# Patient Record
Sex: Male | Born: 1970
Health system: Southern US, Community
[De-identification: ages and names within clinical notes are randomized; demographics above are authoritative.]

## PROBLEM LIST (undated history)

## (undated) DIAGNOSIS — I10 Essential (primary) hypertension: Secondary | ICD-10-CM

## (undated) DIAGNOSIS — I2510 Atherosclerotic heart disease of native coronary artery without angina pectoris: Secondary | ICD-10-CM

## (undated) DIAGNOSIS — E785 Hyperlipidemia, unspecified: Secondary | ICD-10-CM

## (undated) DIAGNOSIS — I5030 Unspecified diastolic (congestive) heart failure: Secondary | ICD-10-CM

## (undated) DIAGNOSIS — I82409 Acute embolism and thrombosis of unspecified deep veins of unspecified lower extremity: Secondary | ICD-10-CM

## (undated) DIAGNOSIS — Z7902 Long term (current) use of antithrombotics/antiplatelets: Secondary | ICD-10-CM

## (undated) DIAGNOSIS — K219 Gastro-esophageal reflux disease without esophagitis: Secondary | ICD-10-CM

## (undated) DIAGNOSIS — Z8669 Personal history of other diseases of the nervous system and sense organs: Secondary | ICD-10-CM

## (undated) DIAGNOSIS — R55 Syncope and collapse: Secondary | ICD-10-CM

## (undated) DIAGNOSIS — E119 Type 2 diabetes mellitus without complications: Secondary | ICD-10-CM

## (undated) DIAGNOSIS — I509 Heart failure, unspecified: Secondary | ICD-10-CM

## (undated) HISTORY — PX: COLONOSCOPY: SHX174

## (undated) HISTORY — DX: Hyperlipidemia, unspecified: E78.5

## (undated) HISTORY — DX: Heart failure, unspecified: I50.9

## (undated) HISTORY — DX: Syncope and collapse: R55

## (undated) HISTORY — DX: Unspecified diastolic (congestive) heart failure: I50.30

## (undated) HISTORY — DX: Essential (primary) hypertension: I10

## (undated) HISTORY — DX: Atherosclerotic heart disease of native coronary artery without angina pectoris: I25.10

## (undated) HISTORY — PX: CHOLECYSTECTOMY: SHX55

## (undated) HISTORY — DX: Gastro-esophageal reflux disease without esophagitis: K21.9

## (undated) HISTORY — DX: Personal history of other diseases of the nervous system and sense organs: Z86.69

---

## 2007-01-27 HISTORY — PX: FINGER AMPUTATION: SHX636

## 2011-04-11 ENCOUNTER — Emergency Department: Payer: Self-pay | Admitting: Emergency Medicine

## 2011-04-11 LAB — BASIC METABOLIC PANEL
Anion Gap: 10 (ref 7–16)
BUN: 11 mg/dL (ref 7–18)
Co2: 24 mmol/L (ref 21–32)
Creatinine: 0.85 mg/dL (ref 0.60–1.30)
EGFR (African American): >60
EGFR (Non-African Amer.): >60
Sodium: 143 mmol/L (ref 136–145)

## 2011-04-11 LAB — CBC
MCH: 29.4 pg (ref 26.0–34.0)
MCHC: 34.2 g/dL (ref 32.0–36.0)
Platelet: 245 10*3/uL (ref 150–440)
RBC: 4.49 10*6/uL (ref 4.40–5.90)
WBC: 7.4 10*3/uL (ref 3.8–10.6)

## 2011-06-17 ENCOUNTER — Other Ambulatory Visit (HOSPITAL_COMMUNITY): Payer: Self-pay | Admitting: Orthopedic Surgery

## 2011-06-17 DIAGNOSIS — M545 Low back pain, unspecified: Secondary | ICD-10-CM

## 2011-06-26 ENCOUNTER — Ambulatory Visit (HOSPITAL_COMMUNITY): Payer: Self-pay

## 2011-06-26 ENCOUNTER — Encounter (HOSPITAL_COMMUNITY): Payer: Self-pay

## 2011-09-28 DIAGNOSIS — M549 Dorsalgia, unspecified: Secondary | ICD-10-CM | POA: Insufficient documentation

## 2011-10-12 DIAGNOSIS — R079 Chest pain, unspecified: Principal | ICD-10-CM | POA: Insufficient documentation

## 2011-10-12 DIAGNOSIS — I119 Hypertensive heart disease without heart failure: Secondary | ICD-10-CM | POA: Insufficient documentation

## 2011-10-12 DIAGNOSIS — I43 Cardiomyopathy in diseases classified elsewhere: Secondary | ICD-10-CM | POA: Insufficient documentation

## 2011-10-12 DIAGNOSIS — F17200 Nicotine dependence, unspecified, uncomplicated: Secondary | ICD-10-CM | POA: Insufficient documentation

## 2011-10-13 ENCOUNTER — Observation Stay (HOSPITAL_COMMUNITY)
Admission: EM | Admit: 2011-10-13 | Discharge: 2011-10-13 | Disposition: A | Discharge status: Home / Self Care | Payer: Self-pay | Attending: Emergency Medicine | Admitting: Emergency Medicine

## 2011-10-13 ENCOUNTER — Emergency Department (HOSPITAL_COMMUNITY)
Admit: 2011-10-13 | Discharge: 2011-10-13 | Disposition: A | Discharge status: Home / Self Care | Payer: Self-pay | Attending: Emergency Medicine | Admitting: Emergency Medicine

## 2011-10-13 ENCOUNTER — Encounter (HOSPITAL_COMMUNITY): Payer: Self-pay | Admitting: *Deleted

## 2011-10-13 ENCOUNTER — Other Ambulatory Visit: Payer: Self-pay

## 2011-10-13 ENCOUNTER — Observation Stay (HOSPITAL_COMMUNITY): Payer: Self-pay

## 2011-10-13 DIAGNOSIS — R0789 Other chest pain: Secondary | ICD-10-CM

## 2011-10-13 LAB — TROPONIN I
Troponin I: <0.3 ng/mL (ref <0.30)
Troponin I: <0.3 ng/mL (ref <0.30)

## 2011-10-13 LAB — CBC WITH DIFFERENTIAL/PLATELET
Basophils Absolute: 0 10*3/uL (ref 0.0–0.1)
Basophils Relative: 0 % (ref 0–1)
Eosinophils Relative: 3 % (ref 0–5)
HCT: 40.3 % (ref 39.0–52.0)
MCHC: 36.2 g/dL — ABNORMAL HIGH (ref 30.0–36.0)
MCV: 81.6 fL (ref 78.0–100.0)
Monocytes Absolute: 0.5 10*3/uL (ref 0.1–1.0)
Monocytes Relative: 5 % (ref 3–12)
RDW: 12.8 % (ref 11.5–15.5)

## 2011-10-13 LAB — COMPREHENSIVE METABOLIC PANEL
AST: 14 U/L (ref 0–37)
Albumin: 4.4 g/dL (ref 3.5–5.2)
BUN: 12 mg/dL (ref 6–23)
CO2: 25 mEq/L (ref 19–32)
Calcium: 9.8 mg/dL (ref 8.4–10.5)
Creatinine, Ser: 0.86 mg/dL (ref 0.50–1.35)
GFR calc non Af Amer: >90 mL/min (ref >90)

## 2011-10-13 LAB — RAPID URINE DRUG SCREEN, HOSP PERFORMED
Cocaine: NOT DETECTED
Opiates: POSITIVE — AB

## 2011-10-13 MED ORDER — MORPHINE SULFATE 4 MG/ML IJ SOLN
4.0000 mg | Freq: Once | INTRAMUSCULAR | Status: AC
  Administered 2011-10-13: 4 mg via INTRAVENOUS
  Filled 2011-10-13: qty 1

## 2011-10-13 MED ORDER — IOHEXOL 350 MG/ML SOLN
100.0000 mL | Freq: Once | INTRAVENOUS | Status: AC | PRN
  Administered 2011-10-13: 100 mL via INTRAVENOUS

## 2011-10-13 MED ORDER — HYDROCODONE-ACETAMINOPHEN 5-325 MG PO TABS: 1.0000 | ORAL_TABLET | Freq: Four times a day (QID) | ORAL | Status: DC | PRN

## 2011-10-13 MED ORDER — MORPHINE SULFATE 4 MG/ML IJ SOLN
6.0000 mg | Freq: Once | INTRAMUSCULAR | Status: AC
  Administered 2011-10-13: 6 mg via INTRAVENOUS
  Filled 2011-10-13: qty 2

## 2011-10-13 MED ORDER — ASPIRIN 325 MG PO TABS
325.0000 mg | ORAL_TABLET | Freq: Once | ORAL | Status: AC
  Administered 2011-10-13: 325 mg via ORAL
  Filled 2011-10-13: qty 1

## 2011-10-13 NOTE — ED Provider Notes (Signed)
History     CSN: [id number]  Arrival date & time 10/12/11  2347   First MD Initiated Contact with Patient 10/13/11 [phone]      Chief Complaint  Patient presents with  . Chest Pain    (Consider location/radiation/quality/duration/timing/severity/associated sxs/prior treatment) HPI  Please note that this is a late entry.  This patient is a 41 year old male who reports a history of nonischemic cardiomyopathy secondary to hypertension. He presents with complaints of aching left-sided chest pain which has been constant for the past 3 weeks. This is his third visit to the emergency department in the state for evaluation of the same pain. The pain is constant, 7/10 in severity, nonradiating. The patient denies any exacerbating or relieving factors.  He denies history of similar symptoms. He denies any history of coronary artery disease. His only risk factor for coronary artery disease is hypertension. He reports having had a normal cardiac stress test in 2009 when he was first diagnosed with cardiomyopathy. He says he has associated shortness of breath but only when he is sitting in an upright position. He actually feels better when laying supine.  The patient says he was evaluated at the [facility] emergency department on August 29 for this pain. He said he had an echocardiogram. He says he did not have any other functional study. He was discharged from the emergency department. He was evaluated 2 days later at the [facility] emergency department for the same pain. He says that they did some lab tests and the doctor told him it was his esophagus and gave him a prescription (presumably for a PPI). Patient says he took the medication for 7 days and it did not help his symptoms so, he stopped it. He reports compliance with antihypertensives.  Denies history of VTE. No VTE RF. Denies cocaine use.       No family history on file. - no FH of CAD  History  Substance Use Topics  . Smoking  status: Current Some Day Smoker  . Smokeless tobacco: Not on file  . Alcohol Use: No      Review of Systems  Gen: no weight loss, fevers, chills, night sweats Eyes: no discharge or drainage, no occular pain or visual changes Nose: no epistaxis or rhinorrhea Mouth: no dental pain, no sore throat Neck: no neck pain Lungs: no SOB, cough, wheezing CV: as per hpi, otherwise negative Abd: no abdominal pain, nausea, vomiting GU: no dysuria or gross hematuria MSK: no myalgias or arthralgias Neuro: no headache, no focal neurologic deficits Skin: no rash Psyche: negative.  Allergies  Review of patient's allergies indicates no known allergies.  Home Medications   Current Outpatient Rx  Name Route Sig Dispense Refill  . AMLODIPINE BESYLATE 10 MG PO TABS Oral Take 10 mg by mouth daily.    Marland Kitchen VITAMIN C PO Oral Take 1 tablet by mouth daily.    . ISOSORBIDE MONONITRATE ER 30 MG PO TB24 Oral Take 30 mg by mouth daily.    . ADULT MULTIVITAMIN W/MINERALS CH Oral Take 1 tablet by mouth daily.    Marland Kitchen PRAVASTATIN SODIUM 20 MG PO TABS Oral Take 20 mg by mouth daily.      BP 120/82  Pulse 66  Temp 98.4 F (36.9 C) (Oral)  Resp 18  Ht 6\' 1"  (1.854 m)  Wt 190 lb (86.183 kg)  BMI 25.07 kg/m2  SpO2 98%  Physical Exam  Gen: well developed and well nourished appearing, does not appear to be  in any discomfort or distress. Head: NCAT Eyes: PERL, EOMI Nose: no epistaixis or rhinorrhea Mouth/throat: mucosa is moist and pink Neck: supple, no stridor Lungs: CTA B, no wheezing, rhonchi or rales CV: RRR, no murmur, no jvd, extremities well perfused Abd: soft, notender, nondistended Back: no ttp, no cva ttp MSK: unable to reproduce CP on palpation of region with reported pain Skin: no rashese, wnl Neuro: CN ii-xii grossly intact, no focal deficits Psyche; normal affect,  calm and cooperative.   ED Course  Procedures (including critical care time)  Results for orders placed during the  hospital encounter of 10/13/11 (from the past 24 hour(s))  CBC WITH DIFFERENTIAL     Status: Abnormal   Collection Time   10/13/11 12:22 AM      Component Value Range   WBC 8.4  4.0 - 10.5 K/uL   RBC 4.94  4.22 - 5.81 MIL/uL   Hemoglobin 14.6  13.0 - 17.0 g/dL   HCT 91.4  78.2 - 95.6 %   MCV 81.6  78.0 - 100.0 fL   MCH 29.6  26.0 - 34.0 pg   MCHC 36.2 (*) 30.0 - 36.0 g/dL   RDW 21.3  08.6 - 57.8 %   Platelets 239  150 - 400 K/uL   Neutrophils Relative 64  43 - 77 %   Neutro Abs 5.4  1.7 - 7.7 K/uL   Lymphocytes Relative 27  12 - 46 %   Lymphs Abs 2.3  0.7 - 4.0 K/uL   Monocytes Relative 5  3 - 12 %   Monocytes Absolute 0.5  0.1 - 1.0 K/uL   Eosinophils Relative 3  0 - 5 %   Eosinophils Absolute 0.3  0.0 - 0.7 K/uL   Basophils Relative 0  0 - 1 %   Basophils Absolute 0.0  0.0 - 0.1 K/uL  COMPREHENSIVE METABOLIC PANEL     Status: Abnormal   Collection Time   10/13/11 12:22 AM      Component Value Range   Sodium 140  135 - 145 mEq/L   Potassium 3.8  3.5 - 5.1 mEq/L   Chloride 104  96 - 112 mEq/L   CO2 25  19 - 32 mEq/L   Glucose, Bld 147 (*) 70 - 99 mg/dL   BUN 12  6 - 23 mg/dL   Creatinine, Ser 4.69  0.50 - 1.35 mg/dL   Calcium 9.8  8.4 - 62.9 mg/dL   Total Protein 7.3  6.0 - 8.3 g/dL   Albumin 4.4  3.5 - 5.2 g/dL   AST 14  0 - 37 U/L   ALT 19  0 - 53 U/L   Alkaline Phosphatase 93  39 - 117 U/L   Total Bilirubin 0.4  0.3 - 1.2 mg/dL   GFR calc non Af Amer >90  >90 mL/min   GFR calc Af Amer >90  >90 mL/min  TROPONIN I     Status: Normal   Collection Time   10/13/11 12:23 AM      Component Value Range   Troponin I <0.30  <0.30 ng/mL   EKG: nsr, no acute ischemic changes, normal intervals, left axis deviation, qrs complex is consistent with LVH.   Dg Chest 2 View  10/13/2011  *RADIOLOGY REPORT*  Clinical Data: Chest pain.  CHEST - 2 VIEW  Comparison: None.  Findings: Lungs are clear.  No pneumothorax or pleural fluid. Heart size normal.  No focal bony abnormality.   IMPRESSION: Negative chest.  Original Report Authenticated By: Bernadene Bell. D'ALESSIO, M.D.     DDX: ACS, PE, PNA, PTX, pleural effusion, pericarditis, gastritis, GERD, PUD.      MDM  Emergency Dept work up is non-diagnostic. The patient meets PERC exclusion criteria and I do not believe that further evaluation for PE is indicated. No signs of pericarditis on EKG. No PNA , PTX or pleural effusion on CXR. Clearly, the patient is not suffering an MI with 3 wks of stable left sided chest pain. However, in light of his hx, I believe he will benefit from a functional study. We will control his chest pain then refer to the CDU under CP protocol with plan for rule out by serial markers followed by cardiac CT. Plan discussed with patient who is on board with this plan.         Brandt Loosen, MD 10/13/11 [phone]

## 2011-10-13 NOTE — ED Notes (Signed)
Pt d/c home in NAD. Pt voiced understanding of d/c instructions and follow up care. Pt instructed not to drive after taking vicodin 

## 2011-10-13 NOTE — ED Provider Notes (Signed)
Patient in CDU under chest pain protocol.  Stress echo has been completed:   - Stress: Maximal heart rate during stress was 157bpm (91% of maximal predicted heart rate). The maximal predicted heart rate was 177bpm.The target heart rate was achieved. The heart rate response to stress was normal. - Stress ECG conclusions: Sinus tachycardia. No exercise induced ischemic EKG changes. - Peak stress: Expected global exercise induced increase in LVEF to 70-75% with mid-cavitary obliteration during systole. No wall motion abnormalities were noted. - Baseline: Moderate concentric LVH. Normal LV wall motion at rest. EF 55-60%,. - Impressions: No exercise induced ischemic EKG changes. Negative stress echo for ischemic wall motion abnormalities. Impressions:  - No exercise induced ischemic EKG changes. Negative stress echo for ischemic wall motion abnormalities. Bruce protocol. Stress echocardiography. Height: Height: 185.4cm. Height: 73in. Weight: Weight: 86.4kg. Weight: 190lb. Body mass index: BMI: 25.1kg/m^2. Body surface area: BSA: 2.54m^2. Blood pressure: 126/87. Patient status: Observation.  On exam, patient does not appear distressed, but continues to report a left sided chest pressure.  Repeat 12 lead obtained and unchanged from prior, with no indication of ischemia.  Lungs CTA bilaterally.  S1/S2, RRR. Abdomen soft, bowel sounds present.  Strong distal pulses palpated all extremities.  Patient has an appointment with a cardiologist in 3 weeks.  Discussed with Dr. Gray Bernhardt do not appear to be cardiac related at present.  Patient will be discharged home with prescription for analgesic.    Jimmye Norman, NP 10/13/11 [phone]

## 2011-10-13 NOTE — ED Notes (Signed)
To xary for tests via w/c

## 2011-10-13 NOTE — ED Notes (Signed)
The pt is c/o mid chest pain for 3 weeks.  He has been to several doctors and he does not think that anything is being done foe him

## 2011-10-13 NOTE — Progress Notes (Signed)
Utilization review completed.  

## 2011-10-13 NOTE — ED Notes (Signed)
MD at bedside. 

## 2011-10-13 NOTE — ED Provider Notes (Signed)
Medical screening examination/treatment/procedure(s) were performed by non-physician practitioner and as supervising physician I was immediately available for consultation/collaboration.   Zavia Pullen B. Izeyah Deike, MD 10/13/11 2317 

## 2011-10-13 NOTE — Progress Notes (Signed)
   CARE MANAGEMENT ED NOTE 10/13/2011  Patient:  Jacob Baker, Jacob Baker   Account Number:  [id number]  Date Initiated:  10/13/2011  Documentation initiated by:  Fransico Michael  Subjective/Objective Assessment:     Subjective/Objective Assessment Detail:     Action/Plan:   Action/Plan Detail:   Anticipated DC Date:  10/13/2011     Status Recommendation to Physician:   Result of Recommendation:      DC Planning Services  CM consult    Choice offered to / List presented to:            Status of service:  Completed, signed off  ED Comments:   ED Comments Detail:  10/13/11-1343-J.Jasraj Lappe,RN,BSN [phone]      In to speak with patient regarding home needs and lack of primary care physician. Denies having Baker PCP and would like to have information for future reference. List of clinics accepting self pay patients given to patient. Patient denies any needs at home.

## 2011-10-13 NOTE — ED Notes (Addendum)
Pt alert, NAD, calm, interactive, resps e/u, speaking in clear complete sentences, ambulatory, steady gait, states (when asked), "feel a little worse" (non-specific).  Mentions: CP, increased pain with inspiration, HA, some recent cough. (denies: nvd, fever)

## 2011-10-13 NOTE — ED Provider Notes (Signed)
11:25 AM Handoff from Dr. Lavella Lemons at 0730.   Patient was to be placed on CDU CPP and get coronary CT. Unfortunately CT pulmonary angiogram was ordered. This was negative.   Due to radiation exposure, patient is no longer eligible for coronary CT. Will perform stress echocardiography.   Plan: d/c if negative.   11:33 AM patient seen and examined. Patient has a cardiologist in [place] which he has seen as recently as last Friday (4 days ago). He has followup. At that visit he was started on Imdur. He states this medication is on a medium feel worse and was given and headaches. Awaiting stress echo.  Exam:  Gen NAD; Heart RRR, nml S1,S2, no m/r/g; Lungs CTAB; Abd soft, NT, no rebound or guarding; Ext 2+ pedal pulses bilaterally, no edema.  3:56 PM Handoff to [facility] NP. Pending results of stress echo. Can be d/c to home with cardiology follow-up (pt has cardiologist) if negative.     Renne Crigler, [place] 10/13/11 1556

## 2011-10-14 NOTE — ED Provider Notes (Signed)
Medical screening examination/treatment/procedure(s) were performed by non-physician practitioner and as supervising physician I was immediately available for consultation/collaboration.   Dione Booze, MD 10/14/11 [phone]

## 2012-02-25 LAB — DRUG SCREEN, URINE
Amphetamines, Ur Screen: NEGATIVE (ref ?–1000)
Barbiturates, Ur Screen: NEGATIVE (ref ?–200)
Benzodiazepine, Ur Scrn: NEGATIVE (ref ?–200)
Cannabinoid 50 Ng, Ur ~~LOC~~: NEGATIVE (ref ?–50)
Methadone, Ur Screen: NEGATIVE (ref ?–300)
Phencyclidine (PCP) Ur S: NEGATIVE (ref ?–25)

## 2012-02-25 LAB — COMPREHENSIVE METABOLIC PANEL
Albumin: 4.8 g/dL (ref 3.4–5.0)
Alkaline Phosphatase: 104 U/L (ref 50–136)
Anion Gap: 10 (ref 7–16)
Bilirubin,Total: 0.6 mg/dL (ref 0.2–1.0)
Calcium, Total: 9.2 mg/dL (ref 8.5–10.1)
Chloride: 107 mmol/L (ref 98–107)
Co2: 21 mmol/L (ref 21–32)
Creatinine: 0.99 mg/dL (ref 0.60–1.30)
Glucose: 117 mg/dL — ABNORMAL HIGH (ref 65–99)
Potassium: 3.8 mmol/L (ref 3.5–5.1)
SGPT (ALT): 28 U/L (ref 12–78)
Total Protein: 8.4 g/dL — ABNORMAL HIGH (ref 6.4–8.2)

## 2012-02-25 LAB — CBC
HGB: 15.1 g/dL (ref 13.0–18.0)
MCV: 84 fL (ref 80–100)
Platelet: 223 10*3/uL (ref 150–440)
RBC: 5.16 10*6/uL (ref 4.40–5.90)
RDW: 13.3 % (ref 11.5–14.5)

## 2012-02-25 LAB — ETHANOL: Ethanol %: <0.003 % (ref 0.000–0.080)

## 2012-02-25 LAB — SALICYLATE LEVEL: Salicylates, Serum: 5.8 mg/dL — ABNORMAL HIGH

## 2012-02-26 ENCOUNTER — Inpatient Hospital Stay: Payer: Self-pay | Admitting: Psychiatry

## 2013-03-21 ENCOUNTER — Emergency Department: Payer: Self-pay | Admitting: Emergency Medicine

## 2013-03-21 LAB — CBC
HCT: 39.5 % — AB (ref 40.0–52.0)
HGB: 13.7 g/dL (ref 13.0–18.0)
MCH: 28.7 pg (ref 26.0–34.0)
MCHC: 34.6 g/dL (ref 32.0–36.0)
MCV: 83 fL (ref 80–100)
Platelet: 241 10*3/uL (ref 150–440)
RBC: 4.77 10*6/uL (ref 4.40–5.90)
RDW: 13.6 % (ref 11.5–14.5)
WBC: 7.7 10*3/uL (ref 3.8–10.6)

## 2013-03-21 LAB — BASIC METABOLIC PANEL
Anion Gap: 6 — ABNORMAL LOW (ref 7–16)
BUN: 11 mg/dL (ref 7–18)
CHLORIDE: 105 mmol/L (ref 98–107)
Calcium, Total: 8.9 mg/dL (ref 8.5–10.1)
Co2: 29 mmol/L (ref 21–32)
Creatinine: 1.32 mg/dL — ABNORMAL HIGH (ref 0.60–1.30)
EGFR (Non-African Amer.): >60
Glucose: 147 mg/dL — ABNORMAL HIGH (ref 65–99)
Osmolality: 281 (ref 275–301)
Potassium: 3.6 mmol/L (ref 3.5–5.1)
SODIUM: 140 mmol/L (ref 136–145)

## 2013-03-21 LAB — TROPONIN I

## 2013-03-22 ENCOUNTER — Observation Stay: Payer: Self-pay | Admitting: Internal Medicine

## 2013-03-22 LAB — BASIC METABOLIC PANEL
Anion Gap: 5 — ABNORMAL LOW (ref 7–16)
BUN: 11 mg/dL (ref 7–18)
CALCIUM: 9.3 mg/dL (ref 8.5–10.1)
CO2: 28 mmol/L (ref 21–32)
CREATININE: 1.03 mg/dL (ref 0.60–1.30)
Chloride: 106 mmol/L (ref 98–107)
EGFR (African American): >60
Glucose: 95 mg/dL (ref 65–99)
Osmolality: 277 (ref 275–301)
Potassium: 3.4 mmol/L — ABNORMAL LOW (ref 3.5–5.1)
Sodium: 139 mmol/L (ref 136–145)

## 2013-03-22 LAB — CBC
HCT: 39.1 % — ABNORMAL LOW (ref 40.0–52.0)
HGB: 13.7 g/dL (ref 13.0–18.0)
MCH: 29.2 pg (ref 26.0–34.0)
MCHC: 35.2 g/dL (ref 32.0–36.0)
MCV: 83 fL (ref 80–100)
Platelet: 239 10*3/uL (ref 150–440)
RBC: 4.71 10*6/uL (ref 4.40–5.90)
RDW: 13.6 % (ref 11.5–14.5)
WBC: 8.9 10*3/uL (ref 3.8–10.6)

## 2013-03-22 LAB — TROPONIN I

## 2013-03-23 LAB — TROPONIN I: Troponin-I: <0.02 ng/mL

## 2013-03-23 LAB — LIPID PANEL
Cholesterol: 180 mg/dL (ref 0–200)
HDL: 21 mg/dL — AB (ref 40–60)
Ldl Cholesterol, Calc: 115 mg/dL — ABNORMAL HIGH (ref 0–100)
Triglycerides: 218 mg/dL — ABNORMAL HIGH (ref 0–200)
VLDL CHOLESTEROL, CALC: 44 mg/dL — AB (ref 5–40)

## 2013-03-23 LAB — CK TOTAL AND CKMB (NOT AT ARMC)
CK, TOTAL: 77 U/L
CK, Total: 65 U/L
CK-MB: <0.5 ng/mL — ABNORMAL LOW (ref 0.5–3.6)
CK-MB: <0.5 ng/mL — ABNORMAL LOW (ref 0.5–3.6)

## 2013-04-25 ENCOUNTER — Encounter: Payer: Self-pay | Admitting: Adult Health

## 2013-04-25 ENCOUNTER — Ambulatory Visit (INDEPENDENT_AMBULATORY_CARE_PROVIDER_SITE_OTHER): Payer: BC Managed Care – PPO | Admitting: Adult Health

## 2013-04-25 ENCOUNTER — Telehealth: Payer: Self-pay | Admitting: Adult Health

## 2013-04-25 ENCOUNTER — Ambulatory Visit: Payer: Self-pay | Admitting: Adult Health

## 2013-04-25 VITALS — BP 120/82 | HR 87 | Temp 98.4°F | Resp 14 | Ht 72.6 in | Wt 203.0 lb

## 2013-04-25 DIAGNOSIS — G479 Sleep disorder, unspecified: Secondary | ICD-10-CM

## 2013-04-25 DIAGNOSIS — R0789 Other chest pain: Secondary | ICD-10-CM

## 2013-04-25 DIAGNOSIS — R079 Chest pain, unspecified: Secondary | ICD-10-CM

## 2013-04-25 DIAGNOSIS — I10 Essential (primary) hypertension: Secondary | ICD-10-CM

## 2013-04-25 DIAGNOSIS — R9431 Abnormal electrocardiogram [ECG] [EKG]: Secondary | ICD-10-CM

## 2013-04-25 DIAGNOSIS — F411 Generalized anxiety disorder: Secondary | ICD-10-CM

## 2013-04-25 DIAGNOSIS — F419 Anxiety disorder, unspecified: Secondary | ICD-10-CM

## 2013-04-25 MED ORDER — SERTRALINE HCL 50 MG PO TABS: 50.0000 mg | ORAL_TABLET | Freq: Every day | ORAL | Status: DC

## 2013-04-25 MED ORDER — LISINOPRIL 20 MG PO TABS: 20.0000 mg | ORAL_TABLET | Freq: Every day | ORAL | Status: DC

## 2013-04-25 NOTE — Progress Notes (Signed)
Patient ID: Jacob Baker, male   DOB: April 09, 1970, 43 y.o.   MRN: [id number]   Subjective:    Patient ID: Jacob Baker, male    DOB: Jun 19, 1970, 43 y.o.   MRN: [id number]  HPI  Pt is a pleasant 43 y/o male who presents to clinic to establish care. He reports recent admission to [facility] for observation for chest pain. His chest pain is localized over the right side of his chest. There is some shortness of breath associated with his chest pain. During hospitalization his cardiac enzymes were normal. He had an abnormal EKG with possible inferior ischemia. He has not followed up on this. His pain has been ongoing since February when he moved. However, he reports that he has had ~ 5 visits to the ED at [facility] in the last year for episodes of chest pain. He reports feeling anxious and "on edge" often. He works as a Administrator. He has been having trouble sleeping. He takes Advil PM.    Past Medical History  Diagnosis Date  . GERD (gastroesophageal reflux disease)   . Hypertension   . Hyperlipidemia   . History of migraine      Past Surgical History  Procedure Laterality Date  . Finger amputation  2009    partial removal left index finger      Family History  Problem Relation Age of Onset  . Heart disease Father     CAD  . Hypertension Father   . Diabetes Father   . Hypertension Sister   . Hypertension Brother   . Hypertension Brother      History   Social History  . Marital Status: Single    Spouse Name: N/A    Number of Children: N/A  . Years of Education: N/A   Occupational History  . Not on file.   Social History Main Topics  . Smoking status: Current Some Day Smoker -- 2.00 packs/day for 27 years    Types: Cigarettes  . Smokeless tobacco: Not on file  . Alcohol Use: No  . Drug Use: No  . Sexual Activity: Not on file   Other Topics Concern  . Not on file   Social History Narrative  . No narrative on file     Current Outpatient Prescriptions on File Prior to  Visit  Medication Sig Dispense Refill  . amLODipine (NORVASC) 10 MG tablet Take 10 mg by mouth daily.      . pravastatin (PRAVACHOL) 20 MG tablet Take 20 mg by mouth daily.       No current facility-administered medications on file prior to visit.     Review of Systems  Constitutional: Negative.   HENT: Negative.   Eyes: Negative.   Respiratory: Positive for cough and shortness of breath. Negative for chest tightness and wheezing.   Cardiovascular: Positive for chest pain. Negative for palpitations.  Gastrointestinal: Negative.   Endocrine: Negative.   Genitourinary: Negative.   Musculoskeletal: Negative.   Skin: Negative.   Neurological: Negative.   Psychiatric/Behavioral: Positive for sleep disturbance. Negative for hallucinations, behavioral problems, confusion, dysphoric mood, decreased concentration and agitation. The patient is nervous/anxious ("on edge"). The patient is not hyperactive.        Objective:  BP 120/82  Pulse 87  Temp(Src) 98.4 F (36.9 C) (Oral)  Resp 14  Ht 6' 0.6" (1.844 m)  Wt 203 lb (92.08 kg)  BMI 27.08 kg/m2  SpO2 97%   Physical Exam  Constitutional: He is oriented to person,  place, and time. He appears well-developed and well-nourished. No distress.  Appears concerned  HENT:  Head: Normocephalic and atraumatic.  Eyes: Conjunctivae and EOM are normal.  Neck: Normal range of motion. Neck supple.  Cardiovascular: Normal rate, regular rhythm, normal heart sounds and intact distal pulses.  Exam reveals no gallop and no friction rub.   No murmur heard. Pulmonary/Chest: Effort normal and breath sounds normal. No respiratory distress. He has no wheezes. He has no rales.  Abdominal: Soft. Bowel sounds are normal.  Musculoskeletal: Normal range of motion. He exhibits tenderness. He exhibits no edema.  Tenderness right side of chest  Neurological: He is alert and oriented to person, place, and time. He has normal reflexes. Coordination normal.  Skin:  Skin is warm and dry.  Psychiatric: He has a normal mood and affect. His behavior is normal. Judgment and thought content normal.       Assessment & Plan:   1. Chest pain of unknown etiology ?costochondritis given that the pain is localized over the right side next to the sternum. I will obtain a chest xray. - DG Chest 2 View; Future  2. Abnormal EKG EKG showed T wave abnormality. Cardiac enzymes were normal. He does have cardiac risk factors - HTN, HLD, smoker and family hx. I am referring him to Cardiology for further evaluation. Cards will determine further testing. - Ambulatory referral to Cardiology  3. HTN (hypertension) Well controlled in clinic. I am adding lisinopril today. Check bmet and he will return in 1 week for follow up labs. Continue to follow. - Basic metabolic panel; Future  4. Sleep disturbance Recommend trying melatonin 10 mg at bedtime.   5. Anxiety Start zoloft 50 mg. Discussed that this medication also helps with anxiety and may help calm him so that he does not feel so much on edge. Discussed that full effects will take ~ 6-8 weeks although he may experience some benefit within the first week of taking. Follow up in 6 weeks.

## 2013-04-25 NOTE — Telephone Encounter (Signed)
Relevant patient education mailed to patient.  

## 2013-04-25 NOTE — Progress Notes (Signed)
Pre visit review using our clinic review tool, if applicable. No additional management support is needed unless otherwise documented below in the visit note. 

## 2013-04-26 ENCOUNTER — Telehealth: Payer: Self-pay | Admitting: Adult Health

## 2013-04-26 DIAGNOSIS — R079 Chest pain, unspecified: Secondary | ICD-10-CM | POA: Insufficient documentation

## 2013-04-26 DIAGNOSIS — R9431 Abnormal electrocardiogram [ECG] [EKG]: Secondary | ICD-10-CM | POA: Insufficient documentation

## 2013-04-26 DIAGNOSIS — R0789 Other chest pain: Principal | ICD-10-CM

## 2013-04-26 DIAGNOSIS — G479 Sleep disorder, unspecified: Secondary | ICD-10-CM | POA: Insufficient documentation

## 2013-04-26 DIAGNOSIS — F419 Anxiety disorder, unspecified: Secondary | ICD-10-CM | POA: Insufficient documentation

## 2013-04-26 DIAGNOSIS — I10 Essential (primary) hypertension: Secondary | ICD-10-CM | POA: Insufficient documentation

## 2013-04-26 NOTE — Telephone Encounter (Signed)
Called for results of [facility]

## 2013-04-26 NOTE — Telephone Encounter (Signed)
Relevant patient education mailed to patient.  

## 2013-04-27 NOTE — Telephone Encounter (Signed)
Advised pt of normal xray results

## 2013-05-04 ENCOUNTER — Other Ambulatory Visit (INDEPENDENT_AMBULATORY_CARE_PROVIDER_SITE_OTHER): Payer: BC Managed Care – PPO

## 2013-05-04 DIAGNOSIS — I10 Essential (primary) hypertension: Secondary | ICD-10-CM

## 2013-05-04 LAB — BASIC METABOLIC PANEL
BUN: 10 mg/dL (ref 6–23)
CALCIUM: 9.2 mg/dL (ref 8.4–10.5)
CHLORIDE: 105 meq/L (ref 96–112)
CO2: 29 mEq/L (ref 19–32)
CREATININE: 0.9 mg/dL (ref 0.4–1.5)
GFR: 117.34 mL/min (ref >60.00)
Glucose, Bld: 119 mg/dL — ABNORMAL HIGH (ref 70–99)
Potassium: 4 mEq/L (ref 3.5–5.1)
Sodium: 140 mEq/L (ref 135–145)

## 2013-05-05 ENCOUNTER — Other Ambulatory Visit: Payer: Self-pay | Admitting: Adult Health

## 2013-05-05 DIAGNOSIS — R739 Hyperglycemia, unspecified: Secondary | ICD-10-CM

## 2013-05-05 NOTE — Progress Notes (Signed)
Notified pt of results, pt stated that he was fasting for the lab.  Scheduled a lab appt for Monday to have A1C checked

## 2013-05-08 ENCOUNTER — Encounter: Payer: Self-pay | Admitting: *Deleted

## 2013-05-08 ENCOUNTER — Other Ambulatory Visit (INDEPENDENT_AMBULATORY_CARE_PROVIDER_SITE_OTHER): Payer: BC Managed Care – PPO

## 2013-05-08 ENCOUNTER — Encounter: Payer: Self-pay | Admitting: Cardiovascular Disease

## 2013-05-08 ENCOUNTER — Ambulatory Visit (INDEPENDENT_AMBULATORY_CARE_PROVIDER_SITE_OTHER): Payer: BC Managed Care – PPO | Admitting: Cardiovascular Disease

## 2013-05-08 VITALS — BP 184/105 | HR 75 | Ht 73.0 in | Wt 207.0 lb

## 2013-05-08 DIAGNOSIS — I10 Essential (primary) hypertension: Secondary | ICD-10-CM

## 2013-05-08 DIAGNOSIS — R7309 Other abnormal glucose: Secondary | ICD-10-CM

## 2013-05-08 DIAGNOSIS — R079 Chest pain, unspecified: Secondary | ICD-10-CM

## 2013-05-08 DIAGNOSIS — R739 Hyperglycemia, unspecified: Secondary | ICD-10-CM

## 2013-05-08 DIAGNOSIS — R002 Palpitations: Secondary | ICD-10-CM

## 2013-05-08 LAB — HEMOGLOBIN A1C: HEMOGLOBIN A1C: 5.6 % (ref 4.6–6.5)

## 2013-05-08 MED ORDER — POTASSIUM CHLORIDE ER 10 MEQ PO TBCR: 10.0000 meq | EXTENDED_RELEASE_TABLET | Freq: Every day | ORAL | Status: DC

## 2013-05-08 MED ORDER — HYDROCHLOROTHIAZIDE 25 MG PO TABS: 25.0000 mg | ORAL_TABLET | Freq: Every day | ORAL | Status: DC

## 2013-05-08 MED ORDER — AMLODIPINE BESYLATE 10 MG PO TABS: 10.0000 mg | ORAL_TABLET | Freq: Every day | ORAL | Status: DC

## 2013-05-08 NOTE — Patient Instructions (Addendum)
1. You need to greatly reduce the salt.  REDUCE HIGH SODIUM FOODS LIKE CANNED SOUP, GRAVY, SAUCES, READY PREPARED FOODS LIKE FROZEN FOODS; LEAN CUISINE, LASAGNA. BACON, SAUSAGE, LUNCH MEAT, FAST FOODS, HOT DOGS, CHIPS, PIZZA.   2. You  needs to stop smoking.  Your physician has recommended you make the following change in your medication:   Add HCTZ 25 a day  AddKdur 10 meq a day. Stop Lisinopril  Start Amlodipine 10 mg daily  Your physician recommends that you schedule a follow-up appointment in:  1 month  Your physician recommends that you return for lab work in:  At your follow up appointment

## 2013-05-08 NOTE — Assessment & Plan Note (Signed)
Tammy has had high blood pressure for the past 6 years. He's been evaluated several emergency rooms and also has had a stress echo. He was told that he had congestive heart failure when he was admitted to [facility] approximately 5 or 6 years. I suspect that this was some diastolic dysfunction. He was seen at [facility] and had a stress echocardiogram that was normal. He had a hyperdynamic left ventricle systolic function at that time.  He now is been seen at [facility] at [facility] for chest discomfort. His cardiac enzymes were negative. His EKG shows left ventricular hypertrophy.  He has multiple risk factors for hypertension including cigarette smoking, high salt diet, and a strong family history of hypertension.  We had long discussion about in his diet and also working on smoking cessation plan.  I have given some instructions on a low salt diet.   I suspect that he'll do better on a diuretic and calcium channel blocker instead of ACE inhibitor. We'll discontinue the lisinopril and will start him on amlodipine 10 mg a day (this has worked well for him in the past)  and HCTZ  25 mg a day. We will also add potassium chloride 10 mEq a day.

## 2013-05-08 NOTE — Progress Notes (Signed)
Jacob Baker Date of Birth  Aug 24, 1970       [facility] [address], [place], [place]  [address]   [place], [place]  [address] [address]   Fax  [phone]     Fax [phone]  Problem List: 1. Hypertension  History of Present Illness:  Jacob Baker is a 43 yo BM , he was diagnosed with hypertension approximately 6 years ago.  He has taken HCTZ in the past.    He was admitted to [facility] recently with some chest pain.  The pain was right sided, atypical .  Enzymes were negative.  ECG showed LVH with repol.  He's been having cardiac problems for the past 6 years or so. He was admitted to [facility] approximately 5 years ago and was diagnosed with congestive heart failure.  He did not have any insurance so   He was admitted to [facility] in 2013. He had a stress echocardiogram which was normal.  He had been having CP for the prior 2 weeks.  CP is typically at rest.  He does not get any regular exercise.  He works as a Geophysicist/field seismologist for [employer].   Smokes 1-2  ppd .  No ETOH.   He was discharged with NTG.  He had some chest tightness. Associated with dyspnea   + fmaily hx of HTN.  Also CHF,  No Fhx of stroke   He eats a unrestricted diet.   Eats lots of junk food and soft drinks all day and then eats at home - occasionally eats very salty meals.  Current Outpatient Prescriptions on File Prior to Visit  Medication Sig Dispense Refill  . aspirin 81 MG tablet Take 81 mg by mouth daily.      . cyanocobalamin 1000 MCG tablet Take 100 mcg by mouth daily.      Marland Kitchen lisinopril (PRINIVIL,ZESTRIL) 20 MG tablet Take 1 tablet (20 mg total) by mouth daily.  30 tablet  3  . sertraline (ZOLOFT) 50 MG tablet Take 1 tablet (50 mg total) by mouth daily.  30 tablet  3   No current facility-administered medications on file prior to visit.    Not on File  Past Medical History  Diagnosis Date  .  GERD (gastroesophageal reflux disease)   . Hypertension   . Hyperlipidemia   . History of migraine   . Syncope and collapse   . CHF (congestive heart failure)     Past Surgical History  Procedure Laterality Date  . Finger amputation  2009    partial removal left index finger     History  Smoking status  . Current Every Day Smoker -- 2.00 packs/day for 27 years  . Types: Cigarettes  Smokeless tobacco  . Not on file    History  Alcohol Use No    Family History  Problem Relation Age of Onset  . Heart disease Father     CAD  . Hypertension Father   . Diabetes Father   . Hypertension Sister   . Hypertension Brother   . Hypertension Brother     Reviw of Systems:  Reviewed in the HPI.  All other systems are negative.  Physical Exam: Blood pressure 184/105, pulse 75, height 6\' 1"  (1.854 m), weight 207 lb (93.895 kg). Wt Readings from Last 3 Encounters:  05/08/13 207 lb (93.895 kg)  04/25/13 203 lb (  92.08 kg)  10/13/11 190 lb (86.183 kg)     General: Well developed, well nourished, in no acute distress.  Head: Normocephalic, atraumatic, sclera non-icteric, mucus membranes are moist,   Neck: Supple. Carotids are 2 + without bruits. No JVD  Lungs: Clear  Heart: RR, normal S1S2 Abdomen: Soft, non-tender, non-distended with normal bowel sounds. Msk:  strength and tone are normal  Extremities: No clubbing or cyanosis. No edema.  Distal pedal pulses are 2+ and equal  Neuro: CN II - XII intact.  Alert and oriented X 3.  Psych:  Normal   ECG: May 08, 2013:  NSR at 73.  Occasional PVC.  LVH with repol abn.   Assessment / Plan:

## 2013-06-07 ENCOUNTER — Encounter: Payer: Self-pay | Admitting: Adult Health

## 2013-06-16 ENCOUNTER — Encounter: Payer: Self-pay | Admitting: *Deleted

## 2013-06-16 ENCOUNTER — Ambulatory Visit: Payer: BC Managed Care – PPO | Admitting: Cardiovascular Disease

## 2013-07-06 ENCOUNTER — Ambulatory Visit: Payer: BC Managed Care – PPO | Admitting: Adult Health

## 2013-08-09 ENCOUNTER — Inpatient Hospital Stay: Payer: Self-pay | Admitting: Psychiatry

## 2013-08-09 LAB — COMPREHENSIVE METABOLIC PANEL
ALT: 20 U/L (ref 12–78)
AST: 19 U/L (ref 15–37)
Albumin: 4.3 g/dL (ref 3.4–5.0)
Alkaline Phosphatase: 96 U/L
Anion Gap: 8 (ref 7–16)
BILIRUBIN TOTAL: 0.8 mg/dL (ref 0.2–1.0)
BUN: 8 mg/dL (ref 7–18)
Calcium, Total: 8.7 mg/dL (ref 8.5–10.1)
Chloride: 103 mmol/L (ref 98–107)
Co2: 26 mmol/L (ref 21–32)
Creatinine: 1.3 mg/dL (ref 0.60–1.30)
EGFR (African American): >60
EGFR (Non-African Amer.): >60
Glucose: 152 mg/dL — ABNORMAL HIGH (ref 65–99)
OSMOLALITY: 275 (ref 275–301)
Potassium: 2.9 mmol/L — ABNORMAL LOW (ref 3.5–5.1)
Sodium: 137 mmol/L (ref 136–145)
TOTAL PROTEIN: 8 g/dL (ref 6.4–8.2)

## 2013-08-09 LAB — DRUG SCREEN, URINE
Amphetamines, Ur Screen: NEGATIVE (ref ?–1000)
BENZODIAZEPINE, UR SCRN: POSITIVE (ref ?–200)
Barbiturates, Ur Screen: NEGATIVE (ref ?–200)
Cannabinoid 50 Ng, Ur ~~LOC~~: NEGATIVE (ref ?–50)
Cocaine Metabolite,Ur ~~LOC~~: NEGATIVE (ref ?–300)
MDMA (Ecstasy)Ur Screen: NEGATIVE (ref ?–500)
Methadone, Ur Screen: NEGATIVE (ref ?–300)
OPIATE, UR SCREEN: NEGATIVE (ref ?–300)
Phencyclidine (PCP) Ur S: NEGATIVE (ref ?–25)
Tricyclic, Ur Screen: NEGATIVE (ref ?–1000)

## 2013-08-09 LAB — ACETAMINOPHEN LEVEL: Acetaminophen: <2 ug/mL

## 2013-08-09 LAB — CBC
HCT: 39 % — ABNORMAL LOW (ref 40.0–52.0)
HGB: 13.3 g/dL (ref 13.0–18.0)
MCH: 29.1 pg (ref 26.0–34.0)
MCHC: 34.2 g/dL (ref 32.0–36.0)
MCV: 85 fL (ref 80–100)
Platelet: 238 10*3/uL (ref 150–440)
RBC: 4.57 10*6/uL (ref 4.40–5.90)
RDW: 13.6 % (ref 11.5–14.5)
WBC: 8.6 10*3/uL (ref 3.8–10.6)

## 2013-08-09 LAB — URINALYSIS, COMPLETE
BACTERIA: NONE SEEN
Bilirubin,UR: NEGATIVE
Blood: NEGATIVE
GLUCOSE, UR: NEGATIVE mg/dL (ref 0–75)
Ketone: NEGATIVE
Leukocyte Esterase: NEGATIVE
Nitrite: NEGATIVE
PROTEIN: NEGATIVE
Ph: 5 (ref 4.5–8.0)
RBC,UR: 2 /HPF (ref 0–5)
SPECIFIC GRAVITY: 1.018 (ref 1.003–1.030)
WBC UR: 3 /HPF (ref 0–5)

## 2013-08-09 LAB — ETHANOL
Ethanol %: <0.003 % (ref 0.000–0.080)
Ethanol: <3 mg/dL

## 2013-08-09 LAB — SALICYLATE LEVEL: SALICYLATES, SERUM: 5.6 mg/dL — AB

## 2013-09-07 ENCOUNTER — Encounter: Payer: Self-pay | Admitting: Adult Health

## 2013-09-07 ENCOUNTER — Ambulatory Visit (INDEPENDENT_AMBULATORY_CARE_PROVIDER_SITE_OTHER): Payer: BC Managed Care – PPO | Admitting: Adult Health

## 2013-09-07 VITALS — BP 126/88 | HR 86 | Temp 98.1°F | Resp 14 | Ht 73.0 in | Wt 195.5 lb

## 2013-09-07 DIAGNOSIS — R21 Rash and other nonspecific skin eruption: Secondary | ICD-10-CM

## 2013-09-07 DIAGNOSIS — I10 Essential (primary) hypertension: Secondary | ICD-10-CM

## 2013-09-07 MED ORDER — NYSTATIN-TRIAMCINOLONE 100000-0.1 UNIT/GM-% EX OINT: 1.0000 "application " | TOPICAL_OINTMENT | Freq: Two times a day (BID) | CUTANEOUS | Status: DC

## 2013-09-07 NOTE — Progress Notes (Signed)
Patient ID: [name], male   DOB: 09-19-1970, 43 y.o.   MRN: [id number]   Subjective:    Patient ID: Jacob Baker, male    DOB: [birth year], 43 y.o.   MRN: [id number]  HPIMr. Faucett is a very pleasant 43 y/o male who presents to clinic for follow up of HTN. He has a letter from the DOT stating that we need to provide them a letter verifying that patient's blood pressure is controlled and list of the medications.  1. HTN (hypertension)  Mr. Herrman is taking amlodipine 10 mg daily. He monitors his blood pressure at least 2 times a week to keep a close check on his readings. He is compliant with his medication and compliant with keeping his appointments.  2. Rash on groin He reports sweating a lot during work. Has been applying over the counter jock itch treatment but it has not gone away. The area is itchy.  Past Medical History  Diagnosis Date  . GERD (gastroesophageal reflux disease)   . Hypertension   . Hyperlipidemia   . History of migraine   . Syncope and collapse   . CHF (congestive heart failure)     Current Outpatient Prescriptions on File Prior to Visit  Medication Sig Dispense Refill  . amLODipine (NORVASC) 10 MG tablet Take 1 tablet (10 mg total) by mouth daily.  90 tablet  3  . aspirin 81 MG tablet Take 81 mg by mouth daily.      . cyanocobalamin 1000 MCG tablet Take 100 mcg by mouth daily.       No current facility-administered medications on file prior to visit.     Review of Systems  Constitutional: Negative.   HENT: Negative.   Eyes: Negative.   Cardiovascular: Negative.  Negative for chest pain, palpitations and leg swelling.  Gastrointestinal: Negative.   Endocrine: Negative.   Genitourinary: Negative.   Musculoskeletal: Negative.   Skin: Positive for rash (groin).  Allergic/Immunologic: Negative.   Neurological: Negative.   Hematological: Negative.   Psychiatric/Behavioral: Negative.        Objective:  BP 126/88  Pulse 86  Temp(Src) 98.1 F  (36.7 C) (Oral)  Resp 14  Ht 6\' 1"  (1.854 m)  Wt 195 lb 8 oz (88.678 kg)  BMI 25.80 kg/m2  SpO2 100%   Physical Exam  Constitutional: He is oriented to person, place, and time. He appears well-developed and well-nourished. No distress.  HENT:  Head: Normocephalic and atraumatic.  Eyes: Conjunctivae and EOM are normal.  Neck: Neck supple.  Cardiovascular: Normal rate and regular rhythm.   Pulmonary/Chest: Effort normal. No respiratory distress.  Musculoskeletal: Normal range of motion.  Neurological: He is alert and oriented to person, place, and time. Coordination normal.  Skin: Rash noted.  Psychiatric: He has a normal mood and affect. His behavior is normal. Judgment and thought content normal.      Assessment & Plan:   1. Essential hypertension Well controlled on amlodipine 10 mg. Compliant. Follow up 6 months Letter Provided for the DOT stating that pt was controlled, compliant and safe to drive pertaining to HTN.  2. Rash of groin Triamcinolone/nystatin cream bid x 7 days to affected area. Preferably should wear boxers to prevent excessive moisture in groin from sweat Dry well after shower Call if no improvement.

## 2013-09-07 NOTE — Progress Notes (Signed)
Pre visit review using our clinic review tool, if applicable. No additional management support is needed unless otherwise documented below in the visit note. 

## 2013-10-04 ENCOUNTER — Emergency Department: Payer: Self-pay | Admitting: Emergency Medicine

## 2013-10-04 LAB — COMPREHENSIVE METABOLIC PANEL
AST: 8 U/L — AB (ref 15–37)
Albumin: 4.1 g/dL (ref 3.4–5.0)
Alkaline Phosphatase: 114 U/L
Anion Gap: 9 (ref 7–16)
BUN: 13 mg/dL (ref 7–18)
Bilirubin,Total: 0.4 mg/dL (ref 0.2–1.0)
CO2: 25 mmol/L (ref 21–32)
Calcium, Total: 9.2 mg/dL (ref 8.5–10.1)
Chloride: 104 mmol/L (ref 98–107)
Creatinine: 1.35 mg/dL — ABNORMAL HIGH (ref 0.60–1.30)
EGFR (African American): >60
EGFR (Non-African Amer.): >60
GLUCOSE: 175 mg/dL — AB (ref 65–99)
OSMOLALITY: 280 (ref 275–301)
Potassium: 3 mmol/L — ABNORMAL LOW (ref 3.5–5.1)
SGPT (ALT): 19 U/L
Sodium: 138 mmol/L (ref 136–145)
TOTAL PROTEIN: 7.4 g/dL (ref 6.4–8.2)

## 2013-10-04 LAB — CBC WITH DIFFERENTIAL/PLATELET
BASOS PCT: 0.4 %
Basophil #: 0 10*3/uL (ref 0.0–0.1)
Eosinophil #: 0.2 10*3/uL (ref 0.0–0.7)
Eosinophil %: 1.8 %
HCT: 39.4 % — ABNORMAL LOW (ref 40.0–52.0)
HGB: 13.6 g/dL (ref 13.0–18.0)
LYMPHS ABS: 1.2 10*3/uL (ref 1.0–3.6)
Lymphocyte %: 13.2 %
MCH: 28.9 pg (ref 26.0–34.0)
MCHC: 34.5 g/dL (ref 32.0–36.0)
MCV: 84 fL (ref 80–100)
Monocyte #: 0.6 x10 3/mm (ref 0.2–1.0)
Monocyte %: 6.7 %
Neutrophil #: 7.1 10*3/uL — ABNORMAL HIGH (ref 1.4–6.5)
Neutrophil %: 77.9 %
Platelet: 234 10*3/uL (ref 150–440)
RBC: 4.69 10*6/uL (ref 4.40–5.90)
RDW: 14.1 % (ref 11.5–14.5)
WBC: 9.1 10*3/uL (ref 3.8–10.6)

## 2013-10-04 LAB — URINALYSIS, COMPLETE
Bacteria: NONE SEEN
Bilirubin,UR: NEGATIVE
Blood: NEGATIVE
GLUCOSE, UR: NEGATIVE mg/dL (ref 0–75)
Hyaline Cast: 3
KETONE: NEGATIVE
Leukocyte Esterase: NEGATIVE
Nitrite: NEGATIVE
Ph: 5 (ref 4.5–8.0)
RBC,UR: 1 /HPF (ref 0–5)
Specific Gravity: 1.021 (ref 1.003–1.030)
Squamous Epithelial: NONE SEEN
WBC UR: 2 /HPF (ref 0–5)

## 2013-10-12 ENCOUNTER — Ambulatory Visit: Payer: BC Managed Care – PPO | Admitting: Adult Health

## 2013-10-12 ENCOUNTER — Encounter: Payer: Self-pay | Admitting: Internal Medicine

## 2013-10-12 ENCOUNTER — Ambulatory Visit (INDEPENDENT_AMBULATORY_CARE_PROVIDER_SITE_OTHER): Payer: BC Managed Care – PPO | Admitting: Internal Medicine

## 2013-10-12 VITALS — BP 128/88 | HR 75 | Temp 97.6°F | Resp 16 | Ht 73.0 in | Wt 191.0 lb

## 2013-10-12 DIAGNOSIS — K2990 Gastroduodenitis, unspecified, without bleeding: Secondary | ICD-10-CM

## 2013-10-12 DIAGNOSIS — T3995XA Adverse effect of unspecified nonopioid analgesic, antipyretic and antirheumatic, initial encounter: Secondary | ICD-10-CM

## 2013-10-12 DIAGNOSIS — B9681 Helicobacter pylori [H. pylori] as the cause of diseases classified elsewhere: Secondary | ICD-10-CM

## 2013-10-12 DIAGNOSIS — T39395A Adverse effect of other nonsteroidal anti-inflammatory drugs [NSAID], initial encounter: Secondary | ICD-10-CM

## 2013-10-12 DIAGNOSIS — E876 Hypokalemia: Secondary | ICD-10-CM

## 2013-10-12 DIAGNOSIS — A048 Other specified bacterial intestinal infections: Secondary | ICD-10-CM

## 2013-10-12 DIAGNOSIS — I10 Essential (primary) hypertension: Secondary | ICD-10-CM

## 2013-10-12 DIAGNOSIS — K2970 Gastritis, unspecified, without bleeding: Secondary | ICD-10-CM

## 2013-10-12 DIAGNOSIS — K2960 Other gastritis without bleeding: Secondary | ICD-10-CM

## 2013-10-12 DIAGNOSIS — K2940 Chronic atrophic gastritis without bleeding: Secondary | ICD-10-CM

## 2013-10-12 DIAGNOSIS — T398X5A Adverse effect of other nonopioid analgesics and antipyretics, not elsewhere classified, initial encounter: Secondary | ICD-10-CM

## 2013-10-12 LAB — BASIC METABOLIC PANEL
BUN: 9 mg/dL (ref 6–23)
CALCIUM: 9 mg/dL (ref 8.4–10.5)
CO2: 24 mEq/L (ref 19–32)
Chloride: 105 mEq/L (ref 96–112)
Creatinine, Ser: 1 mg/dL (ref 0.4–1.5)
GFR: 110.02 mL/min (ref >60.00)
Glucose, Bld: 131 mg/dL — ABNORMAL HIGH (ref 70–99)
Potassium: 3.9 mEq/L (ref 3.5–5.1)
SODIUM: 141 meq/L (ref 135–145)

## 2013-10-12 LAB — LIPASE: LIPASE: 24 U/L (ref 11.0–59.0)

## 2013-10-12 LAB — MAGNESIUM: Magnesium: 2.4 mg/dL (ref 1.5–2.5)

## 2013-10-12 MED ORDER — TRAMADOL HCL 50 MG PO TABS: 100.0000 mg | ORAL_TABLET | Freq: Three times a day (TID) | ORAL | Status: DC | PRN

## 2013-10-12 MED ORDER — PANTOPRAZOLE SODIUM 40 MG PO TBEC: 40.0000 mg | DELAYED_RELEASE_TABLET | Freq: Every day | ORAL | Status: DC

## 2013-10-12 NOTE — Patient Instructions (Addendum)
DO NOT TAKE ANY MORE ALEVE OR MOTRIN !! IT IS CAUSING YOUR ABDOMINAL PAIN  YOU CAN USE TRAMADOL FOR YOUR BACK PAIN UP TO 6 DAILY   PLEASE TAKE THE PROTONIX EVERY MORNING ON AN EMPTY STOMACH   THE BREATH TEST AND THE STOOL TEST HELP ME DETERMINE IF YOU HAVE AN ULCER   IF YOUR ABDOMINAL PAIN DOES NOT IMPROVE IN 2 WEEKS  LET ME KNOW  WE WILL CALL YOU WITH THE RESULTS OF [facility]   Gastritis, Adult Gastritis is soreness and swelling (inflammation) of the lining of the stomach. Gastritis can develop as a sudden onset (acute) or long-term (chronic) condition. If gastritis is not treated, it can lead to stomach bleeding and ulcers. CAUSES  Gastritis occurs when the stomach lining is weak or damaged. Digestive juices from the stomach then inflame the weakened stomach lining. The stomach lining may be weak or damaged due to viral or bacterial infections. One common bacterial infection is the Helicobacter pylori infection. Gastritis can also result from excessive alcohol consumption, taking certain medicines, or having too much acid in the stomach.  SYMPTOMS  In some cases, there are no symptoms. When symptoms are present, they may include:  Pain or a burning sensation in the upper abdomen.  Nausea.  Vomiting.  An uncomfortable feeling of fullness after eating. DIAGNOSIS  Your caregiver may suspect you have gastritis based on your symptoms and a physical exam. To determine the cause of your gastritis, your caregiver may perform the following:  Blood or stool tests to check for the H pylori bacterium.  Gastroscopy. A thin, flexible tube (endoscope) is passed down the esophagus and into the stomach. The endoscope has a light and camera on the end. Your caregiver uses the endoscope to view the inside of the stomach.  Taking a tissue sample (biopsy) from the stomach to examine under a microscope. TREATMENT  Depending on the cause of your gastritis, medicines may be prescribed. If you have a  bacterial infection, such as an H pylori infection, antibiotics may be given. If your gastritis is caused by too much acid in the stomach, H2 blockers or antacids may be given. Your caregiver may recommend that you stop taking aspirin, ibuprofen, or other nonsteroidal anti-inflammatory drugs (NSAIDs). HOME CARE INSTRUCTIONS  Only take over-the-counter or prescription medicines as directed by your caregiver.  If you were given antibiotic medicines, take them as directed. Finish them even if you start to feel better.  Drink enough fluids to keep your urine clear or pale yellow.  Avoid foods and drinks that make your symptoms worse, such as:  Caffeine or alcoholic drinks.  Chocolate.  Peppermint or mint flavorings.  Garlic and onions.  Spicy foods.  Citrus fruits, such as oranges, lemons, or limes.  Tomato-based foods such as sauce, chili, salsa, and pizza.  Fried and fatty foods.  Eat small, frequent meals instead of large meals. SEEK IMMEDIATE MEDICAL CARE IF:   You have black or dark red stools.  You vomit blood or material that looks like coffee grounds.  You are unable to keep fluids down.  Your abdominal pain gets worse.  You have a fever.  You do not feel better after 1 week.  You have any other questions or concerns. MAKE SURE YOU:  Understand these instructions.  Will watch your condition.  Will get help right away if you are not doing well or get worse. Document Released: 01/06/2001 Document Revised: 07/14/2011 Document Reviewed: 02/25/2011 Exit[employer] Patient Information 2015  [employer]. This information is not intended to replace advice given to you by your health care provider. Make sure you discuss any questions you have with your health care provider.

## 2013-10-12 NOTE — Progress Notes (Signed)
Patient ID: Jacob Baker, male   DOB: 1970/08/02, 43 y.o.   MRN: [id number]   Patient Active Problem List   Diagnosis Date Noted  . Gastritis due to nonsteroidal anti-inflammatory drug (NSAID) 10/14/2013  . Hypokalemia 10/14/2013  . Rash of groin 09/07/2013  . Chest pain of unknown etiology 04/26/2013  . Abnormal EKG 04/26/2013  . HTN (hypertension) 04/26/2013  . Sleep disturbance 04/26/2013  . Anxiety 04/26/2013    Subjective:  CC:   Chief Complaint  Patient presents with  . Follow-up    Right lower back pain    HPI:   Jacob Baker is a 43 y.o. male who presents for ED follow up for persistent right sided back pain.pain is aggravated by twisting and bending.  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    appears to be occupationall, climibs in an out of truck all day  Uninentional weight loss of 16 lbs since April. "I'm under a lot of stress."  Accompanied by fiance today. Was admitted to Behavioral health in July for four days due to worsening depression an suicidal ideation complicated by medication noncompliance. Was started on wellbutrin 150 mg bid and has regular follow up with psychiatry and psychology    Smoker  Hypokalemia, persistent.  Potassium level whas been low on multiple occasions  In July,  During ER visit this week.  No history of diuretic use,  No prior workjp.   Abdominal pain.  Gastritis,  Has been using Aleve in excess  No habitual  Alcohol use.    Past Medical History  Diagnosis Date  . GERD (gastroesophageal reflux disease)   . Hypertension   . Hyperlipidemia   . History of migraine   . Syncope and collapse   . CHF (congestive heart failure)     Past Surgical History  Procedure Laterality Date  . Finger amputation  2009    partial removal left index finger        The following portions of the patient's history were reviewed and updated as appropriate: Allergies, current medications, and problem  list.    Review of Systems:   Patient denies headache, fevers, malaise, , skin rash, eye pain, sinus congestion and sinus pain, sore throat, dysphagia,  hemoptysis , cough, dyspnea, wheezing, chest pain, palpitations, orthopnea, edema,  melena, diarrhea, constipation, flank pain, dysuria, hematuria, urinary  Frequency, nocturia, numbness, tingling, seizures,  Focal weakness, Loss of consciousness,  Tremor, iand suicidal ideation.     History   Social History  . Marital Status: Single    Spouse Name: N/A    Number of Children: N/A  . Years of Education: N/A   Occupational History  . Not on file.   Social History Main Topics  . Smoking status: Current Every Day Smoker -- 2.00 packs/day for 27 years    Types: Cigarettes  . Smokeless tobacco: Not on file  . Alcohol Use: No  . Drug Use: No  . Sexual Activity: Not on file   Other Topics Concern  . Not on file   Social History Narrative  . No narrative on file    Objective:  Filed Vitals:   10/12/13 0819  BP: 128/88  Pulse: 75  Temp: 97.6 F (36.4 C)  Resp: 16     General appearance: alert, cooperative and appears stated age Ears: normal TM's and external ear canals both ears Throat: lips, mucosa, and tongue normal; teeth and gums  normal Neck: no adenopathy, no carotid bruit, supple, symmetrical, trachea midline and thyroid not enlarged, symmetric, no tenderness/mass/nodules Back: symmetric, no curvature. ROM normal. No CVA tenderness. Lungs: clear to auscultation bilaterally Heart: regular rate and rhythm, S1, S2 normal, no murmur, click, rub or gallop Abdomen: soft, non-tender; bowel sounds normal; no masses,  no organomegaly Pulses: 2+ and symmetric Skin: Skin color, texture, turgor normal. No rashes or lesions Lymph nodes: Cervical, supraclavicular, and axillary nodes normal.  Assessment and Plan:  Gastritis due to nonsteroidal anti-inflammatory drug (NSAID) By history and exam.  Home FOBTS given,  Advised  to stop using NSAIDs,  PPI started and  tramadol for pain   HTN (hypertension) He has recurrenthypokalemia  Despite avoidance of diuretics suggestive of hyperaldosteronism.  Calcium channel blocker chosen due to diastolic dyfunction noted on prior ECHO, may need to add spironolactone.    Hypokalemia Currently resolved.  Magnesium normal.  Consider hyperaldosterone as cause if recurrent.  A total of 40 minutes was spent with patient more than half of which was spent in counseling patient on the above mentioned issues , reviewing and explaining recent labs and imaging studies done, and coordination of care.  Updated Medication List Outpatient Encounter Prescriptions as of 10/12/2013  Medication Sig  . amLODipine (NORVASC) 10 MG tablet Take 1 tablet (10 mg total) by mouth daily.  Marland Kitchen aspirin 81 MG tablet Take 81 mg by mouth daily.  Marland Kitchen buPROPion (WELLBUTRIN SR) 150 MG 12 hr tablet Take 150 mg by mouth 2 (two) times daily.  . cyanocobalamin 1000 MCG tablet Take 100 mcg by mouth daily.  Marland Kitchen nystatin-triamcinolone ointment (MYCOLOG) Apply 1 application topically 2 (two) times daily. Apply to affected area twice daily for 1 week.  . pantoprazole (PROTONIX) 40 MG tablet Take 1 tablet (40 mg total) by mouth daily.  . traMADol (ULTRAM) 50 MG tablet Take 2 tablets (100 mg total) by mouth every 8 (eight) hours as needed.     Orders Placed This Encounter  Procedures  . Fecal occult blood, imunochemical  . Basic metabolic panel  . Magnesium  . H. pylori breath test  . Lipase    Return in about 4 weeks (around 11/09/2013).

## 2013-10-12 NOTE — Progress Notes (Signed)
Pre-visit discussion using our clinic review tool. No additional management support is needed unless otherwise documented below in the visit note.  

## 2013-10-14 DIAGNOSIS — E876 Hypokalemia: Secondary | ICD-10-CM | POA: Insufficient documentation

## 2013-10-14 DIAGNOSIS — K2960 Other gastritis without bleeding: Secondary | ICD-10-CM | POA: Insufficient documentation

## 2013-10-14 DIAGNOSIS — T39395A Adverse effect of other nonsteroidal anti-inflammatory drugs [NSAID], initial encounter: Secondary | ICD-10-CM

## 2013-10-14 NOTE — Assessment & Plan Note (Signed)
By history and exam.  Home FOBTS given,  Advised to stop using NSAIDs,  PPI started and  tramadol for pain

## 2013-10-14 NOTE — Assessment & Plan Note (Signed)
Currently resolved.  Magnesium normal.  Consider hyperaldosterone as cause if recurrent.

## 2013-10-14 NOTE — Assessment & Plan Note (Signed)
He has persistent hypokalemia  Despite avoidance of diuretics suggestive of hyperaldosteronism.  Calcium channel blocker chosen due to diastolic dyfunction noted on prior ECHO, may need to add spironolactone.

## 2013-10-16 ENCOUNTER — Encounter: Payer: Self-pay | Admitting: *Deleted

## 2013-10-16 LAB — H. PYLORI BREATH TEST: H. PYLORI BREATH TEST: DETECTED — AB

## 2013-10-18 DIAGNOSIS — Z8619 Personal history of other infectious and parasitic diseases: Secondary | ICD-10-CM | POA: Insufficient documentation

## 2013-10-18 MED ORDER — CLARITHROMYCIN 250 MG PO TABS: 500.0000 mg | ORAL_TABLET | Freq: Two times a day (BID) | ORAL | Status: DC

## 2013-10-18 MED ORDER — AMOXICILLIN 500 MG PO CAPS: 1000.0000 mg | ORAL_CAPSULE | Freq: Two times a day (BID) | ORAL | Status: DC

## 2013-10-18 NOTE — Addendum Note (Signed)
Addended by: [name] on: 10/18/2013 09:31 PM   Modules accepted: Orders

## 2013-10-19 ENCOUNTER — Other Ambulatory Visit: Payer: BC Managed Care – PPO

## 2013-10-20 ENCOUNTER — Other Ambulatory Visit: Payer: Self-pay | Admitting: *Deleted

## 2013-10-20 ENCOUNTER — Other Ambulatory Visit (INDEPENDENT_AMBULATORY_CARE_PROVIDER_SITE_OTHER): Payer: BC Managed Care – PPO

## 2013-10-20 DIAGNOSIS — K2971 Gastritis, unspecified, with bleeding: Secondary | ICD-10-CM

## 2013-10-20 DIAGNOSIS — Z1211 Encounter for screening for malignant neoplasm of colon: Secondary | ICD-10-CM

## 2013-10-20 LAB — FECAL OCCULT BLOOD, IMMUNOCHEMICAL: FECAL OCCULT BLD: POSITIVE — AB

## 2013-10-23 ENCOUNTER — Other Ambulatory Visit: Payer: Self-pay | Admitting: *Deleted

## 2013-10-23 MED ORDER — AMOXICILLIN 500 MG PO CAPS: 1000.0000 mg | ORAL_CAPSULE | Freq: Two times a day (BID) | ORAL | Status: DC

## 2013-10-23 NOTE — Telephone Encounter (Signed)
Spoke with patient regarding lab results, states he has taken 3 days of Amoxicillin, but remainder of pills have been misplaced, requesting Rx to pharmacy to complete therapy. Rx sent to pharmacy by escript

## 2013-11-06 ENCOUNTER — Ambulatory Visit: Payer: Self-pay | Admitting: Gastroenterology

## 2013-11-06 LAB — HM COLONOSCOPY

## 2013-11-14 ENCOUNTER — Encounter: Payer: Self-pay | Admitting: *Deleted

## 2013-11-15 ENCOUNTER — Telehealth: Payer: Self-pay | Admitting: Internal Medicine

## 2013-11-15 DIAGNOSIS — D126 Benign neoplasm of colon, unspecified: Secondary | ICD-10-CM | POA: Insufficient documentation

## 2013-11-15 DIAGNOSIS — K449 Diaphragmatic hernia without obstruction or gangrene: Secondary | ICD-10-CM | POA: Insufficient documentation

## 2013-11-24 ENCOUNTER — Telehealth: Payer: Self-pay | Admitting: Internal Medicine

## 2013-11-24 ENCOUNTER — Ambulatory Visit (INDEPENDENT_AMBULATORY_CARE_PROVIDER_SITE_OTHER): Payer: BC Managed Care – PPO | Admitting: Internal Medicine

## 2013-11-24 ENCOUNTER — Encounter: Payer: Self-pay | Admitting: Internal Medicine

## 2013-11-24 VITALS — BP 132/88 | HR 95 | Temp 97.9°F | Resp 16 | Ht 73.0 in | Wt 195.2 lb

## 2013-11-24 DIAGNOSIS — K2970 Gastritis, unspecified, without bleeding: Secondary | ICD-10-CM

## 2013-11-24 DIAGNOSIS — Z87891 Personal history of nicotine dependence: Secondary | ICD-10-CM | POA: Insufficient documentation

## 2013-11-24 DIAGNOSIS — T3991XA Poisoning by unspecified nonopioid analgesic, antipyretic and antirheumatic, accidental (unintentional), initial encounter: Secondary | ICD-10-CM

## 2013-11-24 DIAGNOSIS — T39395A Adverse effect of other nonsteroidal anti-inflammatory drugs [NSAID], initial encounter: Secondary | ICD-10-CM

## 2013-11-24 DIAGNOSIS — K2960 Other gastritis without bleeding: Secondary | ICD-10-CM

## 2013-11-24 DIAGNOSIS — Z720 Tobacco use: Secondary | ICD-10-CM

## 2013-11-24 DIAGNOSIS — R109 Unspecified abdominal pain: Secondary | ICD-10-CM

## 2013-11-24 DIAGNOSIS — K449 Diaphragmatic hernia without obstruction or gangrene: Secondary | ICD-10-CM

## 2013-11-24 DIAGNOSIS — F419 Anxiety disorder, unspecified: Secondary | ICD-10-CM

## 2013-11-24 DIAGNOSIS — R7301 Impaired fasting glucose: Secondary | ICD-10-CM

## 2013-11-24 DIAGNOSIS — K2990 Gastroduodenitis, unspecified, without bleeding: Secondary | ICD-10-CM

## 2013-11-24 DIAGNOSIS — K5900 Constipation, unspecified: Secondary | ICD-10-CM

## 2013-11-24 DIAGNOSIS — R739 Hyperglycemia, unspecified: Secondary | ICD-10-CM

## 2013-11-24 DIAGNOSIS — D126 Benign neoplasm of colon, unspecified: Secondary | ICD-10-CM

## 2013-11-24 DIAGNOSIS — F17200 Nicotine dependence, unspecified, uncomplicated: Secondary | ICD-10-CM | POA: Insufficient documentation

## 2013-11-24 DIAGNOSIS — B9681 Helicobacter pylori [H. pylori] as the cause of diseases classified elsewhere: Secondary | ICD-10-CM

## 2013-11-24 DIAGNOSIS — R7309 Other abnormal glucose: Secondary | ICD-10-CM

## 2013-11-24 MED ORDER — POLYETHYLENE GLYCOL 3350 17 G PO PACK: 17.0000 g | PACK | Freq: Every day | ORAL | Status: DC

## 2013-11-24 MED ORDER — DICYCLOMINE HCL 20 MG PO TABS: 20.0000 mg | ORAL_TABLET | Freq: Three times a day (TID) | ORAL | Status: DC

## 2013-11-24 MED ORDER — DOCUSATE SODIUM 100 MG PO CAPS: 100.0000 mg | ORAL_CAPSULE | Freq: Two times a day (BID) | ORAL | Status: DC

## 2013-11-24 MED ORDER — PAROXETINE HCL 10 MG PO TABS: 10.0000 mg | ORAL_TABLET | Freq: Every day | ORAL | Status: DC

## 2013-11-24 MED ORDER — PANTOPRAZOLE SODIUM 40 MG PO TBEC: 40.0000 mg | DELAYED_RELEASE_TABLET | Freq: Every day | ORAL | Status: DC

## 2013-11-24 NOTE — Telephone Encounter (Signed)
Spoke with pt advised each [facility] can see prescriptions from other locations and can fill them.  Pt verballized understanding

## 2013-11-24 NOTE — Progress Notes (Signed)
Pre-visit discussion using our clinic review tool. No additional management support is needed unless otherwise documented below in the visit note.  

## 2013-11-24 NOTE — Progress Notes (Signed)
Patient ID: Jacob Baker, male   DOB: 10-03-1970, 43 y.o.   MRN: [id number]   Patient Active Problem List   Diagnosis Date Noted  . Functional abdominal pain syndrome 11/26/2013  . Impaired fasting glucose 11/26/2013  . Tobacco abuse 11/24/2013  . Hiatal hernia 11/15/2013  . Tubular adenoma of colon 11/15/2013  . Helicobacter pylori gastritis 10/18/2013  . Gastritis due to nonsteroidal anti-inflammatory drug (NSAID) 10/14/2013  . Hypokalemia 10/14/2013  . Rash of groin 09/07/2013  . HTN (hypertension) 04/26/2013  . Anxiety 04/26/2013    Subjective:  CC:   Chief Complaint  Patient presents with  . Follow-up    Patient had an endoscopy and was diagnosed with H-pylori    HPI:   Jacob Baker is a 43 y.o. male who presents for  Follow up on multiple chronic complaints. A total of 40 minutes was spent with patient and fiance addressing the issues below:   1) gastritis secondary to H Pylori.  He underwent EGD for persistent pain in the setting of positive H Pylori Ab.  He was found to have a hiatal hernia , no ulcers. The treatment for H Pylori was apparently repeated.  He continues to report cramping all day long , worse after eating .  2) Constipation:  He continues to report having only having a BM once a week.  Has been this way since August.   Not using anything regularly.  Tried using a fiber capsule,  up to 6 or 7 daily.  Colonoscopy was done revealing several tubular adenomas,  With follow up in 5 years recommended  3) Abnormal fasting glucose.  He has had fasting glucose levels in the past that suggested pre diabetes or diabetes.  4) Tobacco abuse patient wants to quit smoking but has failed on multiple prior attempts due to stress .  He is taking wellbutrin chronically for depression    Past Medical History  Diagnosis Date  . GERD (gastroesophageal reflux disease)   . Hypertension   . Hyperlipidemia   . History of migraine   . Syncope and collapse   . CHF  (congestive heart failure)     Past Surgical History  Procedure Laterality Date  . Finger amputation  2009    partial removal left index finger        The following portions of the patient's history were reviewed and updated as appropriate: Allergies, current medications, and problem list.    Review of Systems:   Patient denies headache, fevers, malaise, unintentional weight loss, skin rash, eye pain, sinus congestion and sinus pain, sore throat, dysphagia,  hemoptysis , cough, dyspnea, wheezing, chest pain, palpitations, orthopnea, edema, abdominal pain, nausea, melena, diarrhea, constipation, flank pain, dysuria, hematuria, urinary  Frequency, nocturia, numbness, tingling, seizures,  Focal weakness, Loss of consciousness,  Tremor, insomnia, depression, anxiety, and suicidal ideation.     History   Social History  . Marital Status: Single    Spouse Name: N/A    Number of Children: N/A  . Years of Education: N/A   Occupational History  . Not on file.   Social History Main Topics  . Smoking status: Current Every Day Smoker -- 2.00 packs/day for 27 years    Types: Cigarettes  . Smokeless tobacco: Not on file  . Alcohol Use: No  . Drug Use: No  . Sexual Activity: Not on file   Other Topics Concern  . Not on file   Social History Narrative    Objective:  Filed  Vitals:   11/24/13 1459  BP: 132/88  Pulse: 95  Temp: 97.9 F (36.6 C)  Resp: 16     General appearance: alert, cooperative and appears stated age Ears: normal TM's and external ear canals both ears Throat: lips, mucosa, and tongue normal; teeth and gums normal Neck: no adenopathy, no carotid bruit, supple, symmetrical, trachea midline and thyroid not enlarged, symmetric, no tenderness/mass/nodules Back: symmetric, no curvature. ROM normal. No CVA tenderness. Lungs: clear to auscultation bilaterally Heart: regular rate and rhythm, S1, S2 normal, no murmur, click, rub or gallop Abdomen: soft,  non-tender; bowel sounds normal; no masses,  no organomegaly Pulses: 2+ and symmetric Skin: Skin color, texture, turgor normal. No rashes or lesions Lymph nodes: Cervical, supraclavicular, and axillary nodes normal.  Assessment and Plan:  Helicobacter pylori gastritis S/p treatment by me and by GI.  He is returning to GI for presumed H pylori Stool Antigen.  Advised to continue protonix   Hiatal hernia concerns about prognosis hiatal hernia addressed and dietary management discussed.   Tubular adenoma of colon Diagnosis discussed with patient today .  Patient concerned he was told that cancerous cells were found,  Advised to discuss this with GI at follow up as this was not my impression based on report received.  Gastritis due to nonsteroidal anti-inflammatory drug (NSAID) Complicated by H pylori.  Treatment completed,  But has stopped PPI which I advised  he continue.   Functional abdominal pain syndrome Workup still in progress.  May be persistent gastritis, IBS,  Mesenteric ischemia, Or gastroparesis.  Trial of dicyclomine and continue PPI  Impaired fasting glucose He has not had a fasting glucose > 125, but his a1c has increased from 5.6 to 5.8   Tobacco abuse Smoking cessation instruction/counseling given:  counseled patient on the dangers of tobacco use, advised patient to stop smoking, and reviewed strategies to maximize success  Anxiety Adding paxil to wellbutrin.    Updated Medication List Outpatient Encounter Prescriptions as of 11/24/2013  Medication Sig  . amLODipine (NORVASC) 10 MG tablet Take 1 tablet (10 mg total) by mouth daily.  Marland Kitchen amoxicillin (AMOXIL) 500 MG capsule Take 2 capsules (1,000 mg total) by mouth 2 (two) times daily.  Marland Kitchen aspirin 81 MG tablet Take 81 mg by mouth daily.  Marland Kitchen buPROPion (WELLBUTRIN SR) 150 MG 12 hr tablet Take 150 mg by mouth 2 (two) times daily.  . clarithromycin (BIAXIN) 250 MG tablet Take 2 tablets (500 mg total) by mouth 2 (two) times  daily.  . cyanocobalamin 1000 MCG tablet Take 100 mcg by mouth daily.  . lansoprazole (PREVACID) 30 MG capsule Take 30 mg by mouth daily at 12 noon.  . nystatin-triamcinolone ointment (MYCOLOG) Apply 1 application topically 2 (two) times daily. Apply to affected area twice daily for 1 week.  . traMADol (ULTRAM) 50 MG tablet Take 2 tablets (100 mg total) by mouth every 8 (eight) hours as needed.  . dicyclomine (BENTYL) 20 MG tablet Take 1 tablet (20 mg total) by mouth 4 (four) times daily -  before meals and at bedtime.  . docusate sodium (COLACE) 100 MG capsule Take 1 capsule (100 mg total) by mouth 2 (two) times daily.  . pantoprazole (PROTONIX) 40 MG tablet Take 1 tablet (40 mg total) by mouth daily.  . pantoprazole (PROTONIX) 40 MG tablet Take 1 tablet (40 mg total) by mouth daily.  Marland Kitchen PARoxetine (PAXIL) 10 MG tablet Take 1 tablet (10 mg total) by mouth daily.  . polyethylene glycol (MIRALAX /  GLYCOLAX) packet Take 17 g by mouth daily.     Orders Placed This Encounter  Procedures  . Comp Met (CMET)  . TSH  . Hemoglobin A1c    Return in about 4 weeks (around 12/22/2013).

## 2013-11-24 NOTE — Telephone Encounter (Signed)
Jacob Baker is wondering if the Rx for Dicyclomine HCL 20MG  that was sent to [facility] on [address] can instead go to [facility] at: [address] in [place]. The phone number to that store (per pt) is: [phone]. If the medication can NOT be sent to [facility], please call the patient and let him know. He'd like all future medications to go to that location. Pt ph# [phone] Thank you.

## 2013-11-24 NOTE — Patient Instructions (Addendum)
We are adding paxil for your anxiety.  Take it once daily with dinner  Your hiatal hernia needs to be managed with avoidiance of eating large meals and avoidance of  Lying down after eating  Your abdominal ain may be caused by   1) persistent gastritis 2) gastroparesis 3) poor circulation in your intestines caused by tobacco abuse 4) irritable bowel syndrome 5) persistent constipation  It will take a few trials to figure this out.  We will start with treating the easy things first :  Constipation,  Gastritis and irritable bowe;  Resume pantoprazole daily in the AM Take dicylomine 20 minutes before each meal (anti spasmodic) Take colace 100 mg tiwice  daily and miralax every night   Hiatal Hernia A hiatal hernia occurs when part of your stomach slides above the muscle that separates your abdomen from your chest (diaphragm). You can be born with a hiatal hernia (congenital), or it may develop over time. In almost all cases of hiatal hernia, only the top part of the stomach pushes through.  Many people have a hiatal hernia with no symptoms. The larger the hernia, the more likely that you will have symptoms. In some cases, a hiatal hernia allows stomach acid to flow back into the tube that carries food from your mouth to your stomach (esophagus). This may cause heartburn symptoms. Severe heartburn symptoms may mean you have developed a condition called gastroesophageal reflux disease (GERD).  CAUSES  Hiatal hernias are caused by a weakness in the opening (hiatus) where your esophagus passes through your diaphragm to attach to the upper part of your stomach. You may be born with a weakness in your hiatus, or a weakness can develop. RISK FACTORS Older age is a major risk factor for a hiatal hernia. Anything that increases pressure on your diaphragm can also increase your risk of a hiatal hernia. This includes:  Pregnancy.  Excess weight.  Frequent constipation. SIGNS AND SYMPTOMS  People  with a hiatal hernia often have no symptoms. If symptoms develop, they are almost always caused by GERD. They may include:  Heartburn.  Belching.  Indigestion.  Trouble swallowing.  Coughing or wheezing.  Sore throat.  Hoarseness.  Chest pain. DIAGNOSIS  A hiatal hernia is sometimes found during an exam for another problem. Your health care provider may suspect a hiatal hernia if you have symptoms of GERD. Tests may be done to diagnose GERD. These may include:  X-rays of your stomach or chest.  An upper gastrointestinal (GI) series. This is an X-ray exam of your GI tract involving the use of a chalky liquid that you swallow. The liquid shows up clearly on the X-ray.  Endoscopy. This is a procedure to look into your stomach using a thin, flexible tube that has a tiny camera and light on the end of it. TREATMENT  If you have no symptoms, you may not need treatment. If you have symptoms, treatment may include:  Dietary and lifestyle changes to help reduce GERD symptoms.  Medicines. These may include:  Over-the-counter antacids.  Medicines that make your stomach empty more quickly.  Medicines that block the production of stomach acid (H2 blockers).  Stronger medicines to reduce stomach acid (proton pump inhibitors).  You may need surgery to repair the hernia if other treatments are not helping. HOME CARE INSTRUCTIONS   Take all medicines as directed by your health care provider.  Quit smoking, if you smoke.  Try to achieve and maintain a healthy body weight.  Eat frequent small meals instead of three large meals a day. This keeps your stomach from getting too full.  Eat slowly.  Do not lie down right after eating.  Do noteat 1-2 hours before bed.   Do not drink beverages with caffeine. These include cola, coffee, cocoa, and tea.  Do not drink alcohol.  Avoid foods that can make symptoms of GERD worse. These may include:  Fatty foods.  Citrus  fruits.  Other foods and drinks that contain acid.  Avoid putting pressure on your belly. Anything that puts pressure on your belly increases the amount of acid that may be pushed up into your esophagus.   Avoid bending over, especially after eating.  Raise the head of your bed by putting blocks under the legs. This keeps your head and esophagus higher than your stomach.  Do not wear tight clothing around your chest or stomach.  Try not to strain when having a bowel movement, when urinating, or when lifting heavy objects. SEEK MEDICAL CARE IF:  Your symptoms are not controlled with medicines or lifestyle changes.  You are having trouble swallowing.  You have coughing or wheezing that will not go away. SEEK IMMEDIATE MEDICAL CARE IF:  Your pain is getting worse.  Your pain spreads to your arms, neck, jaw, teeth, or back.  You have shortness of breath.  You sweat for no reason.  You feel sick to your stomach (nauseous) or vomit.  You vomit blood.  You have bright red blood in your stools.  You have black, tarry stools.  Document Released: 04/04/2003 Document Revised: 05/29/2013 Document Reviewed: 12/30/2012 [facility] Patient Information 2015 [place], [place]. This information is not intended to replace advice given to you by your health care provider. Make sure you discuss any questions you have with your health care provider.  Irritable Bowel Syndrome Irritable bowel syndrome (IBS) is caused by a disturbance of normal bowel function and is a common digestive disorder. You may also hear this condition called spastic colon, mucous colitis, and irritable colon. There is no cure for IBS. However, symptoms often gradually improve or disappear with a good diet, stress management, and medicine. This condition usually appears in late adolescence or early adulthood. Women develop it twice as often as men. CAUSES  After food has been digested and absorbed in the small intestine, waste  material is moved into the large intestine, or colon. In the colon, water and salts are absorbed from the undigested products coming from the small intestine. The remaining residue, or fecal material, is held for elimination. Under normal circumstances, gentle, rhythmic contractions of the bowel walls push the fecal material along the colon toward the rectum. In IBS, however, these contractions are irregular and poorly coordinated. The fecal material is either retained too long, resulting in constipation, or expelled too soon, producing diarrhea. SIGNS AND SYMPTOMS  The most common symptom of IBS is abdominal pain. It is often in the lower left side of the abdomen, but it may occur anywhere in the abdomen. The pain comes from spasms of the bowel muscles happening too much and from the buildup of gas and fecal material in the colon. This pain:  Can range from sharp abdominal cramps to a dull, continuous ache.  Often worsens soon after eating.  Is often relieved by having a bowel movement or passing gas. Abdominal pain is usually accompanied by constipation, but it may also produce diarrhea. The diarrhea often occurs right after a meal or upon waking up in  the morning. The stools are often soft, watery, and flecked with mucus. Other symptoms of IBS include:  Bloating.  Loss of appetite.  Heartburn.  Backache.  Dull pain in the arms or shoulders.  Nausea.  Burping.  Vomiting.  Gas. IBS may also cause symptoms that are unrelated to the digestive system, such as:  Fatigue.  Headaches.  Anxiety.  Shortness of breath.  Trouble concentrating.  Dizziness. These symptoms tend to come and go. DIAGNOSIS  The symptoms of IBS may seem like symptoms of other, more serious digestive disorders. Your health care provider may want to perform tests to exclude these disorders.  TREATMENT Many medicines are available to help correct bowel function or relieve bowel spasms and abdominal pain.  Among the medicines available are:  Laxatives for severe constipation and to help restore normal bowel habits.  Specific antidiarrheal medicines to treat severe or lasting diarrhea.  Antispasmodic agents to relieve intestinal cramps. Your health care provider may also decide to treat you with a mild tranquilizer or sedative during unusually stressful periods in your life. Your health care provider may also prescribe antidepressant medicine. The use of this medicine has been shown to reduce pain and other symptoms of IBS. Remember that if any medicine is prescribed for you, you should take it exactly as directed. Make sure your health care provider knows how well it worked for you. HOME CARE INSTRUCTIONS   Take all medicines as directed by your health care provider.  Avoid foods that are high in fat or oils, such as heavy cream, butter, frankfurters, sausage, and other fatty meats.  Avoid foods that make you go to the bathroom, such as fruit, fruit juice, and dairy products.  Cut out carbonated drinks, chewing gum, and "gassy" foods such as beans and cabbage. This may help relieve bloating and burping.  Eat foods with bran, and drink plenty of liquids with the bran foods. This helps relieve constipation.  Keep track of what foods seem to bring on your symptoms.  Avoid emotionally charged situations or circumstances that produce anxiety.  Start or continue exercising.  Get plenty of rest and sleep. Document Released: 01/12/2005 Document Revised: 01/17/2013 Document Reviewed: 09/02/2007 [facility] Patient Information 2015 [place], [place]. This information is not intended to replace advice given to you by your health care provider. Make sure you discuss any questions you have with your health care provider.

## 2013-11-25 LAB — COMPREHENSIVE METABOLIC PANEL
ALK PHOS: 96 U/L (ref 39–117)
ALT: 13 U/L (ref 0–53)
AST: 13 U/L (ref 0–37)
Albumin: 4.6 g/dL (ref 3.5–5.2)
BUN: 12 mg/dL (ref 6–23)
CO2: 22 mEq/L (ref 19–32)
Calcium: 9.2 mg/dL (ref 8.4–10.5)
Chloride: 107 mEq/L (ref 96–112)
Creat: 0.91 mg/dL (ref 0.50–1.35)
GLUCOSE: 106 mg/dL — AB (ref 70–99)
POTASSIUM: 3.8 meq/L (ref 3.5–5.3)
Sodium: 139 mEq/L (ref 135–145)
Total Bilirubin: 0.7 mg/dL (ref 0.2–1.2)
Total Protein: 6.9 g/dL (ref 6.0–8.3)

## 2013-11-25 LAB — HEMOGLOBIN A1C
HEMOGLOBIN A1C: 5.8 % — AB (ref <5.7)
Mean Plasma Glucose: 120 mg/dL — ABNORMAL HIGH (ref <117)

## 2013-11-25 LAB — TSH: TSH: 1.14 u[IU]/mL (ref 0.350–4.500)

## 2013-11-26 ENCOUNTER — Encounter: Payer: Self-pay | Admitting: Internal Medicine

## 2013-11-26 DIAGNOSIS — R1013 Epigastric pain: Principal | ICD-10-CM

## 2013-11-26 DIAGNOSIS — E1121 Type 2 diabetes mellitus with diabetic nephropathy: Secondary | ICD-10-CM | POA: Insufficient documentation

## 2013-11-26 DIAGNOSIS — R109 Unspecified abdominal pain: Secondary | ICD-10-CM | POA: Insufficient documentation

## 2013-11-26 DIAGNOSIS — E1169 Type 2 diabetes mellitus with other specified complication: Secondary | ICD-10-CM | POA: Insufficient documentation

## 2013-11-26 DIAGNOSIS — E785 Hyperlipidemia, unspecified: Secondary | ICD-10-CM | POA: Insufficient documentation

## 2013-11-26 DIAGNOSIS — G8929 Other chronic pain: Secondary | ICD-10-CM

## 2013-11-26 NOTE — Assessment & Plan Note (Signed)
Diagnosis discussed with patient today .  Patient concerned he was told that cancerous cells were found,  Advised to discuss this with GI at follow up as this was not my impression based on report received.

## 2013-11-26 NOTE — Assessment & Plan Note (Signed)
Adding paxil to wellbutrin.

## 2013-11-26 NOTE — Assessment & Plan Note (Signed)
Complicated by H pylori.  Treatment completed,  But has stopped PPI which I advised  he continue.

## 2013-11-26 NOTE — Assessment & Plan Note (Addendum)
concerns about prognosis hiatal hernia addressed and dietary management discussed.

## 2013-11-26 NOTE — Assessment & Plan Note (Signed)
Smoking cessation instruction/counseling given:  counseled patient on the dangers of tobacco use, advised patient to stop smoking, and reviewed strategies to maximize success 

## 2013-11-26 NOTE — Assessment & Plan Note (Signed)
He has not had a fasting glucose > 125, but his a1c has increased from 5.6 to 5.8

## 2013-11-26 NOTE — Assessment & Plan Note (Addendum)
Workup still in progress.  May be persistent gastritis, IBS,  Mesenteric ischemia, Or gastroparesis.  Trial of dicyclomine and continue PPI

## 2013-11-26 NOTE — Assessment & Plan Note (Signed)
S/p treatment by me and by GI.  He is returning to GI for presumed H pylori Stool Antigen.  Advised to continue protonix

## 2013-12-07 ENCOUNTER — Encounter: Payer: Self-pay | Admitting: Internal Medicine

## 2014-03-12 ENCOUNTER — Encounter: Payer: Self-pay | Admitting: Internal Medicine

## 2014-03-12 ENCOUNTER — Ambulatory Visit (INDEPENDENT_AMBULATORY_CARE_PROVIDER_SITE_OTHER)
Admission: RE | Admit: 2014-03-12 | Discharge: 2014-03-12 | Disposition: A | Discharge status: Home / Self Care | Payer: BLUE CROSS/BLUE SHIELD | Source: Ambulatory Visit | Attending: Internal Medicine | Admitting: Internal Medicine

## 2014-03-12 ENCOUNTER — Ambulatory Visit (INDEPENDENT_AMBULATORY_CARE_PROVIDER_SITE_OTHER): Payer: BLUE CROSS/BLUE SHIELD | Admitting: Internal Medicine

## 2014-03-12 VITALS — BP 128/86 | HR 62 | Temp 98.3°F | Resp 16 | Ht 73.0 in | Wt 208.5 lb

## 2014-03-12 DIAGNOSIS — Z1159 Encounter for screening for other viral diseases: Secondary | ICD-10-CM

## 2014-03-12 DIAGNOSIS — E291 Testicular hypofunction: Secondary | ICD-10-CM

## 2014-03-12 DIAGNOSIS — R7989 Other specified abnormal findings of blood chemistry: Secondary | ICD-10-CM

## 2014-03-12 DIAGNOSIS — R5383 Other fatigue: Secondary | ICD-10-CM

## 2014-03-12 DIAGNOSIS — R635 Abnormal weight gain: Secondary | ICD-10-CM

## 2014-03-12 DIAGNOSIS — R232 Flushing: Secondary | ICD-10-CM

## 2014-03-12 DIAGNOSIS — R059 Cough, unspecified: Secondary | ICD-10-CM

## 2014-03-12 DIAGNOSIS — K2960 Other gastritis without bleeding: Secondary | ICD-10-CM

## 2014-03-12 DIAGNOSIS — R1013 Epigastric pain: Secondary | ICD-10-CM

## 2014-03-12 DIAGNOSIS — R05 Cough: Secondary | ICD-10-CM

## 2014-03-12 DIAGNOSIS — R7301 Impaired fasting glucose: Secondary | ICD-10-CM

## 2014-03-12 DIAGNOSIS — T39395A Adverse effect of other nonsteroidal anti-inflammatory drugs [NSAID], initial encounter: Secondary | ICD-10-CM

## 2014-03-12 DIAGNOSIS — E785 Hyperlipidemia, unspecified: Secondary | ICD-10-CM

## 2014-03-12 DIAGNOSIS — G8929 Other chronic pain: Secondary | ICD-10-CM

## 2014-03-12 DIAGNOSIS — K2970 Gastritis, unspecified, without bleeding: Secondary | ICD-10-CM

## 2014-03-12 DIAGNOSIS — B9681 Helicobacter pylori [H. pylori] as the cause of diseases classified elsewhere: Secondary | ICD-10-CM

## 2014-03-12 DIAGNOSIS — T3991XA Poisoning by unspecified nonopioid analgesic, antipyretic and antirheumatic, accidental (unintentional), initial encounter: Secondary | ICD-10-CM

## 2014-03-12 DIAGNOSIS — R5381 Other malaise: Secondary | ICD-10-CM

## 2014-03-12 DIAGNOSIS — R109 Unspecified abdominal pain: Secondary | ICD-10-CM

## 2014-03-12 DIAGNOSIS — I10 Essential (primary) hypertension: Secondary | ICD-10-CM

## 2014-03-12 DIAGNOSIS — R61 Generalized hyperhidrosis: Secondary | ICD-10-CM

## 2014-03-12 LAB — COMPREHENSIVE METABOLIC PANEL
ALBUMIN: 4.6 g/dL (ref 3.5–5.2)
ALT: 23 U/L (ref 0–53)
AST: 13 U/L (ref 0–37)
Alkaline Phosphatase: 114 U/L (ref 39–117)
BILIRUBIN TOTAL: 0.6 mg/dL (ref 0.2–1.2)
BUN: 12 mg/dL (ref 6–23)
CHLORIDE: 105 meq/L (ref 96–112)
CO2: 28 meq/L (ref 19–32)
Calcium: 9.7 mg/dL (ref 8.4–10.5)
Creatinine, Ser: 1.03 mg/dL (ref 0.40–1.50)
GFR: 100.04 mL/min (ref >60.00)
Glucose, Bld: 133 mg/dL — ABNORMAL HIGH (ref 70–99)
Potassium: 4.2 mEq/L (ref 3.5–5.1)
Sodium: 139 mEq/L (ref 135–145)
Total Protein: 6.9 g/dL (ref 6.0–8.3)

## 2014-03-12 LAB — CBC WITH DIFFERENTIAL/PLATELET
BASOS PCT: 0.3 % (ref 0.0–3.0)
Basophils Absolute: 0 10*3/uL (ref 0.0–0.1)
EOS PCT: 2.2 % (ref 0.0–5.0)
Eosinophils Absolute: 0.1 10*3/uL (ref 0.0–0.7)
HEMATOCRIT: 39.4 % (ref 39.0–52.0)
Hemoglobin: 13.7 g/dL (ref 13.0–17.0)
LYMPHS ABS: 1.3 10*3/uL (ref 0.7–4.0)
Lymphocytes Relative: 21.3 % (ref 12.0–46.0)
MCHC: 34.8 g/dL (ref 30.0–36.0)
MCV: 83 fl (ref 78.0–100.0)
MONOS PCT: 5.9 % (ref 3.0–12.0)
Monocytes Absolute: 0.4 10*3/uL (ref 0.1–1.0)
NEUTROS PCT: 70.3 % (ref 43.0–77.0)
Neutro Abs: 4.4 10*3/uL (ref 1.4–7.7)
Platelets: 258 10*3/uL (ref 150.0–400.0)
RBC: 4.75 Mil/uL (ref 4.22–5.81)
RDW: 13.4 % (ref 11.5–15.5)
WBC: 6.3 10*3/uL (ref 4.0–10.5)

## 2014-03-12 LAB — LIPID PANEL
Cholesterol: 213 mg/dL — ABNORMAL HIGH (ref 0–200)
HDL: 27.4 mg/dL — ABNORMAL LOW (ref >39.00)
NONHDL: 185.6
TRIGLYCERIDES: 321 mg/dL — AB (ref 0.0–149.0)
Total CHOL/HDL Ratio: 8
VLDL: 64.2 mg/dL — AB (ref 0.0–40.0)

## 2014-03-12 LAB — LDL CHOLESTEROL, DIRECT: Direct LDL: 123 mg/dL

## 2014-03-12 LAB — TSH: TSH: 1.66 u[IU]/mL (ref 0.35–4.50)

## 2014-03-12 MED ORDER — PANTOPRAZOLE SODIUM 40 MG PO TBEC: 40.0000 mg | DELAYED_RELEASE_TABLET | Freq: Every day | ORAL | Status: DC

## 2014-03-12 NOTE — Patient Instructions (Addendum)
Resume protonix once daily in the morning and add pepcid (famotodine)  20 mg in the evening   Suspend the aspirin for now and no aleve or motrin either   Use the dicyclomine for the abdominal cramping  You can use miralax,  Metamucil,  Fibercon,  citrucel or benefiber EVERY NIGHT for CONSTIPATION   i HAVE ordered a chest x ray and a testosterone level to investigate your cold sweats   We will get dr Dorothey Baseman records

## 2014-03-12 NOTE — Progress Notes (Signed)
Patient ID: Jacob Baker, male   DOB: [birth year], 44 y.o.   MRN: [id number]   Patient Active Problem List   Diagnosis Date Noted  . Unexplained night sweats 03/13/2014  . Functional abdominal pain syndrome 11/26/2013  . Impaired fasting glucose 11/26/2013  . Tobacco abuse 11/24/2013  . Hiatal hernia 11/15/2013  . Tubular adenoma of colon 11/15/2013  . Helicobacter pylori gastritis 10/18/2013  . Gastritis due to nonsteroidal anti-inflammatory drug (NSAID) 10/14/2013  . Hypokalemia 10/14/2013  . HTN (hypertension) 04/26/2013  . Anxiety 04/26/2013    Subjective:  CC:   Chief Complaint  Patient presents with  . Follow-up    ewas tested for H Pylori one month ago by Dr. Verl Blalock test was positiev but patient stated could not afford the medication.    HPI:   Jacob Baker is a 44 y.o. male who presents for  Follow up on chronic issues.  Patient was diagnosed with H pylori gastritis  Last Sept /Oct aseveral months ago and was treated  Initially by me ,  Again by GI.  According to GI 's notes he was stilll using NSAIDs daily at the time of initial evaluation, despite my adivce and rx of tramadol for back pia,  He was advised to stop using NSAIDs by Darrren Allen Norris and tested positive again for H PYlori,  Notes says he was still using Aleve tid,  EGD showed gastritis ut no ulcer,  Colonoscopy  Showed nondysplastic  tubular adenoma.   Did not take second treatment for H Pyori bc he  could not afford the regimen that was prescribed by Dr Allen Norris and has reportedly not heard back from them regarding alternative treatment.  Marland Kitchen  He continues to report  constipation.  His abd pain comes and goes,  And he reports symptoms of esophagitis.   He states that he is no  longer using Aleve but is taking a baby aspirin daily.   He is not taking protonix or anything else for his gastritis.   Having epsiodes of cold sweats, preceded by hot flashes occurring 2 to 3 times daily both morning and niht for the past 2  months.   Not associated with eating or showering and not accompanied by diarrhea.  Has been coughing for a month,  Nonproductive,  Has paroxysms of cough per fiancee who is present .  Still smoking 1.5 packs daily .Cessation attempts discussed.  States that he quit complete;y  For a period of 2 months  About 2 yrs ago .  Resumed smoking due to increased stress.  Still taking wellbutrin prescribed by Psychiatry, 200 mg bid,   Patient is sullen today,  Provides very little history or background history.  Jacob Baker provides additianl information when asked.     Past Medical History  Diagnosis Date  . GERD (gastroesophageal reflux disease)   . Hypertension   . Hyperlipidemia   . History of migraine   . Syncope and collapse   . CHF (congestive heart failure)     Past Surgical History  Procedure Laterality Date  . Finger amputation  2009    partial removal left index finger        The following portions of the patient's history were reviewed and updated as appropriate: Allergies, current medications, and problem list.    Review of Systems:   Patient denies headache, fevers, , unintentional weight loss, skin rash, eye pain, sinus congestion and sinus pain, sore throat, dysphagia,  hemoptysis , , dyspnea, wheezing, chest pain,  palpitations, orthopnea, edema, , nausea, melena, diarrhea, constipation, flank pain, dysuria, hematuria, urinary  Frequency, nocturia, numbness, tingling, seizures,  Focal weakness, Loss of consciousness,  Tremor, insomnia, and suicidal ideation.     History   Social History  . Marital Status: Single    Spouse Name: N/A  . Number of Children: N/A  . Years of Education: N/A   Occupational History  . Not on file.   Social History Main Topics  . Smoking status: Current Every Day Smoker -- 2.00 packs/day for 27 years    Types: Cigarettes  . Smokeless tobacco: Not on file  . Alcohol Use: No  . Drug Use: No  . Sexual Activity: Not on file   Other Topics  Concern  . Not on file   Social History Narrative    Objective:  Filed Vitals:   03/12/14 1121  BP: 128/86  Pulse: 62  Temp: 98.3 F (36.8 C)  Resp: 16     General appearance: alert, cooperative and appears stated age Ears: normal TM's and external ear canals both ears Throat: lips, mucosa, and tongue normal; teeth and gums normal Neck: no adenopathy, no carotid bruit, supple, symmetrical, trachea midline and thyroid not enlarged, symmetric, no tenderness/mass/nodules Back: symmetric, no curvature. ROM normal. No CVA tenderness. Lungs: clear to auscultation bilaterally Heart: regular rate and rhythm, S1, S2 normal, no murmur, click, rub or gallop Abdomen: soft, non-tender; bowel sounds normal; no masses,  no organomegaly Pulses: 2+ and symmetric Skin: Skin color, texture, turgor normal. No rashes or lesions Lymph nodes: Cervical, supraclavicular, and axillary nodes normal. Psych: affect flat,  makes limited eye contact. No fidgeting,  Appears depressed   Assessment and Plan:  Gastritis due to nonsteroidal anti-inflammatory drug (NSAID) Patient advised to stop daily ASA,  Avoid aleve and motrin, Quit smoking, and resume Protonix or other PPI according to affordability.  Records from GI requested.    Helicobacter pylori gastritis It is unclear whether finished his original course of abx therapy and which second therpay was prescribed by GI but not taken.  Records requested.  Gastritis has improved, so discussed the possibility that he may not need a 2nd treatment , just a daily PPI,  And his abd pain may be improved with another trial of dicyclomine and with improved bowel regularity.   HTN (hypertension) Well controlled on current regimen. Renal function stable, no changes today. Hypokalemia has not recurred, so hyperaldosteronism workup has been deferred.  Lab Results  Component Value Date   CREATININE 1.03 03/12/2014   Lab Results  Component Value Date   NA 139  03/12/2014   K 4.2 03/12/2014   CL 105 03/12/2014   CO2 28 03/12/2014      Functional abdominal pain syndrome Smoking cessation instruction/counseling given:  counseled patient on the dangers of tobacco use, advised patient to stop smoking, and reviewed strategies to maximize success   Unexplained night sweats New symptom.  Chest x ray today was normal.  Thryoid function is normal,  But free and total testosterone levels are low,  Will have him return for am cortisol and see our endocrinologist for further workup   Impaired fasting glucose He now has a fasting glucose > 125, and most recent a1c was  5.8 .  Will have him return for a1c and endocrinology evaluation given concurrent low testosterone, night sweats.     A total of 40 minutes was spent with patient more than half of which was spent in counseling patient on the above  mentioned issues , reviewing and explaining recent procedures done, and coordination of care.   Updated Medication List Outpatient Encounter Prescriptions as of 03/12/2014  Medication Sig  . aspirin 81 MG tablet Take 81 mg by mouth daily.  Marland Kitchen buPROPion (WELLBUTRIN SR) 150 MG 12 hr tablet Take 200 mg by mouth 2 (two) times daily.  . cyanocobalamin 1000 MCG tablet Take 100 mcg by mouth daily.  Marland Kitchen docusate sodium (COLACE) 100 MG capsule Take 1 capsule (100 mg total) by mouth 2 (two) times daily.  . lansoprazole (PREVACID) 30 MG capsule Take 30 mg by mouth daily at 12 noon.  . [DISCONTINUED] amLODipine (NORVASC) 10 MG tablet Take 1 tablet (10 mg total) by mouth daily.  Marland Kitchen amoxicillin (AMOXIL) 500 MG capsule Take 2 capsules (1,000 mg total) by mouth 2 (two) times daily. (Patient not taking: Reported on 03/12/2014)  . dicyclomine (BENTYL) 20 MG tablet Take 1 tablet (20 mg total) by mouth 4 (four) times daily -  before meals and at bedtime. (Patient not taking: Reported on 03/12/2014)  . pantoprazole (PROTONIX) 40 MG tablet Take 1 tablet (40 mg total) by mouth daily.  .  polyethylene glycol (MIRALAX / GLYCOLAX) packet Take 17 g by mouth daily. (Patient not taking: Reported on 03/12/2014)  . [DISCONTINUED] clarithromycin (BIAXIN) 250 MG tablet Take 2 tablets (500 mg total) by mouth 2 (two) times daily. (Patient not taking: Reported on 03/12/2014)  . [DISCONTINUED] nystatin-triamcinolone ointment (MYCOLOG) Apply 1 application topically 2 (two) times daily. Apply to affected area twice daily for 1 week. (Patient not taking: Reported on 03/12/2014)  . [DISCONTINUED] pantoprazole (PROTONIX) 40 MG tablet Take 1 tablet (40 mg total) by mouth daily. (Patient not taking: Reported on 03/12/2014)  . [DISCONTINUED] pantoprazole (PROTONIX) 40 MG tablet Take 1 tablet (40 mg total) by mouth daily. (Patient not taking: Reported on 03/12/2014)  . [DISCONTINUED] PARoxetine (PAXIL) 10 MG tablet Take 1 tablet (10 mg total) by mouth daily. (Patient not taking: Reported on 03/12/2014)  . [DISCONTINUED] traMADol (ULTRAM) 50 MG tablet Take 2 tablets (100 mg total) by mouth every 8 (eight) hours as needed. (Patient not taking: Reported on 03/12/2014)     Orders Placed This Encounter  Procedures  . DG Chest 2 View  . Testosterone, free, total  . CBC with Differential/Platelet  . Comprehensive metabolic panel  . TSH  . Lipid panel  . Hepatitis C antibody  . LDL cholesterol, direct  . Hemoglobin A1c  . Cortisol  . Ambulatory referral to Endocrinology    No Follow-up on file.

## 2014-03-12 NOTE — Progress Notes (Signed)
Pre-visit discussion using our clinic review tool. No additional management support is needed unless otherwise documented below in the visit note.  

## 2014-03-13 ENCOUNTER — Encounter: Payer: Self-pay | Admitting: Internal Medicine

## 2014-03-13 DIAGNOSIS — R61 Generalized hyperhidrosis: Secondary | ICD-10-CM | POA: Insufficient documentation

## 2014-03-13 LAB — TESTOSTERONE, FREE, TOTAL, SHBG
Sex Hormone Binding: 31 nmol/L (ref 10–50)
TESTOSTERONE-% FREE: 2 % (ref 1.6–2.9)
TESTOSTERONE: 223 ng/dL — AB (ref 300–890)
Testosterone, Free: 44.3 pg/mL — ABNORMAL LOW (ref 47.0–244.0)

## 2014-03-13 LAB — HEPATITIS C ANTIBODY: HCV Ab: NEGATIVE

## 2014-03-13 NOTE — Assessment & Plan Note (Signed)
Well controlled on current regimen. Renal function stable, no changes today. Hypokalemia has not recurred, so hyperaldosteronism workup has been deferred.  Lab Results  Component Value Date   CREATININE 1.03 03/12/2014   Lab Results  Component Value Date   NA 139 03/12/2014   K 4.2 03/12/2014   CL 105 03/12/2014   CO2 28 03/12/2014

## 2014-03-13 NOTE — Assessment & Plan Note (Signed)
It is unclear whether finished his original course of abx therapy and which second therpay was prescribed by GI but not taken.  Records requested.  Gastritis has improved, so discussed the possibility that he may not need a 2nd treatment , just a daily PPI,  And his abd pain may be improved with another trial of dicyclomine and with improved bowel regularity.

## 2014-03-13 NOTE — Assessment & Plan Note (Signed)
Patient advised to stop daily ASA,  Avoid aleve and motrin, Quit smoking, and resume Protonix or other PPI according to affordability.  Records from GI requested.

## 2014-03-13 NOTE — Assessment & Plan Note (Addendum)
New symptom.  Chest x ray today was normal.  Thryoid function is normal,  But free and total testosterone levels are low,  Will have him return for am cortisol and see our endocrinologist for further workup

## 2014-03-13 NOTE — Assessment & Plan Note (Signed)
He now has a fasting glucose > 125, and most recent a1c was  5.8 .  Will have him return for a1c and endocrinology evaluation given concurrent low testosterone, night sweats.

## 2014-03-13 NOTE — Assessment & Plan Note (Signed)
Smoking cessation instruction/counseling given:  counseled patient on the dangers of tobacco use, advised patient to stop smoking, and reviewed strategies to maximize success 

## 2014-03-14 ENCOUNTER — Encounter: Payer: Self-pay | Admitting: *Deleted

## 2014-03-21 ENCOUNTER — Telehealth: Payer: Self-pay | Admitting: Internal Medicine

## 2014-03-21 NOTE — Telephone Encounter (Signed)
Sent patient a MY chart message 

## 2014-03-21 NOTE — Telephone Encounter (Signed)
Tried to call patient voicemail is full and received no answer

## 2014-03-21 NOTE — Telephone Encounter (Signed)
Records received from GI .  He told them per recorrds,  That he was treated 5 years ago, and then again in September for H Pylor gastritis,  Recurrent H Pylori is difficult to treat and beyond the level of my expertise,  So if  He is continuining to have moderate abdominal pain after stopping all use of aleve and motrin,  And using protonix daily,  He will need to contact  Dr Dorothey Baseman office and let them  choose the next best treatment

## 2014-03-30 ENCOUNTER — Encounter: Payer: Self-pay | Admitting: Cardiovascular Disease

## 2014-04-10 ENCOUNTER — Ambulatory Visit: Payer: BLUE CROSS/BLUE SHIELD | Admitting: Internal Medicine

## 2014-04-17 ENCOUNTER — Encounter: Payer: Self-pay | Admitting: Endocrinology

## 2014-04-17 ENCOUNTER — Ambulatory Visit (INDEPENDENT_AMBULATORY_CARE_PROVIDER_SITE_OTHER): Payer: BLUE CROSS/BLUE SHIELD | Admitting: Endocrinology

## 2014-04-17 VITALS — BP 128/80 | HR 85 | Resp 14 | Ht 73.0 in | Wt 211.0 lb

## 2014-04-17 DIAGNOSIS — E291 Testicular hypofunction: Secondary | ICD-10-CM

## 2014-04-17 DIAGNOSIS — R7989 Other specified abnormal findings of blood chemistry: Secondary | ICD-10-CM

## 2014-04-17 DIAGNOSIS — R7301 Impaired fasting glucose: Secondary | ICD-10-CM

## 2014-04-17 DIAGNOSIS — R61 Generalized hyperhidrosis: Secondary | ICD-10-CM

## 2014-04-17 NOTE — Patient Instructions (Signed)
Monitor symptoms.  Return this Thursday for morning labs between 8-9 am   Please come back for a follow-up appointment in 1 month.

## 2014-04-17 NOTE — Assessment & Plan Note (Signed)
Discussed with the patient regarding DM pathophysiology. Would test his A1c with  Next set of labs. Last A1c was borderline for DM back in October 2015. Encouraged him to eat healthier meals, regular exercise and lose weight. Further treatment and sugar monitoring at home based on screening A1c.

## 2014-04-17 NOTE — Progress Notes (Signed)
Pre visit review using our clinic review tool, if applicable. No additional management support is needed unless otherwise documented below in the visit note. 

## 2014-04-17 NOTE — Progress Notes (Signed)
HPI: Jacob Baker is a 44 y.o.-year-old man, referred by his PCP, Dr.Tullo, for evaluation and management of low testosterone and IFG.  Higher fasting sugars were noted on last few visits-106-119. FH of DM in father. Truck driver by occupation. Doesn't have a meter to check his sugars. Has been eating very few veggies, mainly fast food and meat.  Last A1c October 2015 was 5.8% Gained 9 lbs over the past 60months due to improved GI symptoms   Also, reporting Fatigue, night sweats for the past couple of months. Staying the same without any worsening.  No prior thyroid hx. Recent TSH normal. Lost weight before due to loss of appetite, abdominal cramping, constipation, now symptoms better>>GI evaluation>>recurrent H. Pylori and GERD with hiatal hernia , now on treatment>>feeling better Headaches on and off 3 weeks , has been having them on and off even prior to that No changes to vision including peripheral vision 1 ppd smoking - not thinking of quitting now No prior hx low Testosterone.  Recent levels taken around mid-day were mildy low at 223 ( normal 300-890) and free Testosterone 45 ( 47-244) Feb 2016 Reports taking HGH injections for 30 days back in August 2015 to bulk up muscles. Denies any active use of steroids or Testosterone.   Depression okay, on long term wellbutrin   He admits for decreased libido at times Has no  difficulty obtaining or maintaining an erection; gets night time erections as well No trauma to testes, testicular irradiation or surgery ( hernia repair as a child) No h/o of mumps orchitis/h/o autoimmune ds. No h/o cryptorchidism He grew and went through puberty like his peers No shrinking of testes. No very small testes (<5 ml) No incomplete/delayed sexual development     No breast discomfort/gynecomastia    No loss of body hair (axillary/pubic)/decreased need for shaving No height loss No abnormal sense of smell (only allergies) No vision problems No worst HA  of his life No FH of hypogonadism/infertility  No personal h/o infertility - has 4 children No FH of hemochromatosis or pituitary tumors No excessive weight gain or loss.  No chronic diseases No chronic pain. Not on opiates, does not take steroids.  Denies alcohol use No anabolic steroids use No herbal medicines Nausea better. No LH or light headedness. Recent lytes fine. Always has been using extra salt in his diet.    No AI ds in his family, no FH of MS. No prior hx blood clots or CAD in himself.   Lab Results  Component Value Date   HGBA1C 5.8* 11/24/2013   Lab Results  Component Value Date   NA 139 03/12/2014   K 4.2 03/12/2014   Lab Results  Component Value Date   TESTOSTERONE 223* 03/12/2014    Review of Systems: [x]  complains of  [  ] denies General:   [  ] Recent weight change [ x ] Fatigue  [  ] Loss of appetite Eyes: [  ]  Vision Difficulty [  ]  Eye pain ENT: [  ]  Hearing difficulty [  ]  Difficulty Swallowing CVS: [  ] Chest pain [  ]  Palpitations/Irregular Heart beat [  ]  Shortness of breath lying flat [  ] Swelling of legs Resp: [x  ] Frequent Cough [x  ] Shortness of Breath  [  ]  Wheezing GI: [x  ] Heartburn  [  ] Nausea or Vomiting  [  ] Diarrhea [  ] Constipation  [  x ] Abdominal Pain GU: [  ]  Polyuria  [  ]  nocturia Bones/joints:  [  ]  Muscle aches  [  ] Joint Pain  [  ] Bone pain Skin/Hair/Nails: [  ]  Rash  [  ] New stretch marks [ x ]  Itching [  ] Hair loss [  ]  Excessive hair growth Reproduction: [ x ] Low sexual desire , [  ]  Women: Menstrual cycle problems [  ]  Women: Breast Discharge [  ] Men: Difficulty with erections [  ]  Men: Enlarged Breasts CNS: [ x ] Frequent Headaches [  ] Blurry vision [  ] Tremors [  ] Seizures [  ] Loss of consciousness [  ] Localized weakness Endocrine: [ x ]  Excess thirst [ x ]  Feeling excessively hot [ x ]  Feeling excessively cold Heme: [ x ]  Easy bruising [  ]  Enlarged glands or lumps in neck Allergy:  [  ]  Food allergies [  ] Environmental allergies   PE: BP 128/80 mmHg  Pulse 85  Resp 14  Ht 6\' 1"  (1.854 m)  Wt 211 lb (95.709 kg)  BMI 27.84 kg/m2  SpO2 96% Wt Readings from Last 3 Encounters:  04/17/14 211 lb (95.709 kg)  03/12/14 208 lb 8 oz (94.575 kg)  11/24/13 195 lb 4 oz (88.565 kg)   HEENT: /AT, EOMI, no icterus, no proptosis, no chemosis, no mild lid lag, no retraction, eyes close completely, gross normal VF testing on confrontation, no acanthosis Neck: thyroid gland - smooth, non-tender, no erythema, no tracheal deviation; negative Pemberton's sign; no lymphadenopathy; no bruits Lungs: good air entry, clear bilaterally Heart: S1&S2 normal, regular rate & rhythm; no murmurs, rubs or gallops Abd: soft, NT, ND, no HSM, +BS Ext: no tremor in hands bilaterally, no edema, 2+ DP/PT pulses, good muscle mass Neuro: normal gait, 2+ reflexes bilaterally, normal 5/5 strength, no proximal myopathy  Derm: no pretibial myxoedema/skin dryness   ASSESSMENT: 1. Low testosterone 2. Flushing 3. IFG   Problem List Items Addressed This Visit      Endocrine   Impaired fasting glucose    Discussed with the patient regarding DM pathophysiology. Would test his A1c with  Next set of labs. Last A1c was borderline for DM back in October 2015. Encouraged him to eat healthier meals, regular exercise and lose weight. Further treatment and sugar monitoring at home based on screening A1c.       Relevant Orders   T4, free   T3, free   Testosterone,Free and Total   Prolactin   FSH/LH   ACTH   Transferrin     Other   Unexplained night sweats - Primary    He does have a recent low Total and free Testosterone level when checked mid day.  Explained diurnal variation and need to repeat levels during early morning for screening for hypogonadism. He doesn't have specific symptoms of Hypogonadism as yet, and further workup and management is to be based on repeat levels. Will have him come in for  labs between 8-9 am and test free T3, free T4, Testosterone, FSH/LH/PRL.  Check transferrin as well due to new onset IFG.  Discussed briefly causes of low T and treatment options with their risks.   If this workup is normal, then might screen for pheo, other rarer endocrine etiologies.  In the interim, he was asked to monitor his symptoms.  Relevant Orders   T4, free   T3, free   Testosterone,Free and Total   Prolactin   FSH/LH   ACTH   Transferrin    Other Visit Diagnoses    Low serum testosterone        Relevant Orders    T4, free    T3, free    Testosterone,Free and Total    Prolactin    FSH/LH    ACTH    Transferrin        RTC 4 weeks or sooner if symptoms persist Kimyetta Flott [facility] 04/17/2014 3:41 PM

## 2014-04-17 NOTE — Assessment & Plan Note (Signed)
He does have a recent low Total and free Testosterone level when checked mid day.  Explained diurnal variation and need to repeat levels during early morning for screening for hypogonadism. He doesn't have specific symptoms of Hypogonadism as yet, and further workup and management is to be based on repeat levels. Will have him come in for labs between 8-9 am and test free T3, free T4, Testosterone, FSH/LH/PRL.  Check transferrin as well due to new onset IFG.  Discussed briefly causes of low T and treatment options with their risks.   If this workup is normal, then might screen for pheo, other rarer endocrine etiologies.  In the interim, he was asked to monitor his symptoms.

## 2014-04-18 ENCOUNTER — Encounter: Payer: Self-pay | Admitting: Internal Medicine

## 2014-04-18 DIAGNOSIS — B351 Tinea unguium: Secondary | ICD-10-CM | POA: Insufficient documentation

## 2014-04-19 ENCOUNTER — Other Ambulatory Visit: Payer: BLUE CROSS/BLUE SHIELD

## 2014-04-23 ENCOUNTER — Other Ambulatory Visit (INDEPENDENT_AMBULATORY_CARE_PROVIDER_SITE_OTHER): Payer: BLUE CROSS/BLUE SHIELD

## 2014-04-23 DIAGNOSIS — R5383 Other fatigue: Secondary | ICD-10-CM

## 2014-04-23 DIAGNOSIS — R7301 Impaired fasting glucose: Secondary | ICD-10-CM | POA: Diagnosis not present

## 2014-04-23 DIAGNOSIS — E291 Testicular hypofunction: Secondary | ICD-10-CM | POA: Diagnosis not present

## 2014-04-23 DIAGNOSIS — R079 Chest pain, unspecified: Secondary | ICD-10-CM

## 2014-04-23 DIAGNOSIS — R1013 Epigastric pain: Secondary | ICD-10-CM

## 2014-04-23 DIAGNOSIS — R61 Generalized hyperhidrosis: Secondary | ICD-10-CM

## 2014-04-23 DIAGNOSIS — R5381 Other malaise: Secondary | ICD-10-CM

## 2014-04-23 DIAGNOSIS — R7989 Other specified abnormal findings of blood chemistry: Secondary | ICD-10-CM

## 2014-04-23 DIAGNOSIS — R002 Palpitations: Secondary | ICD-10-CM

## 2014-04-23 DIAGNOSIS — G8929 Other chronic pain: Secondary | ICD-10-CM

## 2014-04-23 LAB — TRANSFERRIN: Transferrin: 298 mg/dL (ref 212.0–360.0)

## 2014-04-23 LAB — T4, FREE: Free T4: 1 ng/dL (ref 0.60–1.60)

## 2014-04-23 LAB — CORTISOL: CORTISOL PLASMA: 6.2 ug/dL

## 2014-04-23 LAB — T3, FREE: T3, Free: 3.8 pg/mL (ref 2.3–4.2)

## 2014-04-23 LAB — HEMOGLOBIN A1C: HEMOGLOBIN A1C: 5.8 % (ref 4.6–6.5)

## 2014-04-24 LAB — PROLACTIN: PROLACTIN: 4.2 ng/mL (ref 2.1–17.1)

## 2014-04-24 LAB — BASIC METABOLIC PANEL
BUN/Creatinine Ratio: 14 (ref 9–20)
BUN: 15 mg/dL (ref 6–24)
CALCIUM: 9.7 mg/dL (ref 8.7–10.2)
CHLORIDE: 102 mmol/L (ref 97–108)
CO2: 20 mmol/L (ref 18–29)
Creatinine, Ser: 1.06 mg/dL (ref 0.76–1.27)
GFR calc non Af Amer: 84 mL/min/{1.73_m2} (ref >59)
GFR, EST AFRICAN AMERICAN: 97 mL/min/{1.73_m2} (ref >59)
GLUCOSE: 116 mg/dL — AB (ref 65–99)
POTASSIUM: 4.5 mmol/L (ref 3.5–5.2)
Sodium: 139 mmol/L (ref 134–144)

## 2014-04-24 LAB — FSH/LH
FSH: 4.7 m[IU]/mL (ref 1.4–18.1)
LH: 4.3 m[IU]/mL (ref 1.5–9.3)

## 2014-04-24 LAB — TESTOSTERONE,FREE AND TOTAL
TESTOSTERONE FREE: 2.8 pg/mL — AB (ref 6.8–21.5)
TESTOSTERONE: 250 ng/dL — AB (ref 348–1197)

## 2014-04-25 ENCOUNTER — Encounter: Payer: Self-pay | Admitting: *Deleted

## 2014-04-25 ENCOUNTER — Encounter: Payer: Self-pay | Admitting: Cardiovascular Disease

## 2014-04-25 LAB — ACTH: C206 ACTH: 23 pg/mL (ref 6–50)

## 2014-04-26 ENCOUNTER — Other Ambulatory Visit: Payer: Self-pay | Admitting: Endocrinology

## 2014-04-26 DIAGNOSIS — R61 Generalized hyperhidrosis: Secondary | ICD-10-CM

## 2014-04-26 DIAGNOSIS — R7301 Impaired fasting glucose: Secondary | ICD-10-CM

## 2014-04-26 MED ORDER — GLUCOSE BLOOD VI STRP: ORAL_STRIP | Status: AC

## 2014-04-26 MED ORDER — ONETOUCH DELICA LANCETS 33G MISC: Status: AC

## 2014-04-27 ENCOUNTER — Encounter: Payer: Self-pay | Admitting: *Deleted

## 2014-05-15 ENCOUNTER — Encounter: Payer: Self-pay | Admitting: Endocrinology

## 2014-05-15 ENCOUNTER — Encounter: Payer: Self-pay | Admitting: Internal Medicine

## 2014-05-15 ENCOUNTER — Ambulatory Visit (INDEPENDENT_AMBULATORY_CARE_PROVIDER_SITE_OTHER): Payer: BLUE CROSS/BLUE SHIELD | Admitting: Internal Medicine

## 2014-05-15 ENCOUNTER — Ambulatory Visit (INDEPENDENT_AMBULATORY_CARE_PROVIDER_SITE_OTHER): Payer: BLUE CROSS/BLUE SHIELD | Admitting: Endocrinology

## 2014-05-15 VITALS — BP 122/78 | HR 85 | Resp 14 | Ht 73.0 in | Wt 203.8 lb

## 2014-05-15 VITALS — BP 126/92 | HR 75 | Temp 98.5°F | Resp 14 | Ht 73.0 in | Wt 203.8 lb

## 2014-05-15 DIAGNOSIS — B351 Tinea unguium: Secondary | ICD-10-CM

## 2014-05-15 DIAGNOSIS — R7301 Impaired fasting glucose: Secondary | ICD-10-CM

## 2014-05-15 DIAGNOSIS — R61 Generalized hyperhidrosis: Secondary | ICD-10-CM | POA: Diagnosis not present

## 2014-05-15 DIAGNOSIS — L600 Ingrowing nail: Secondary | ICD-10-CM

## 2014-05-15 MED ORDER — TERBINAFINE HCL 250 MG PO TABS: 250.0000 mg | ORAL_TABLET | Freq: Every day | ORAL | Status: DC

## 2014-05-15 NOTE — Patient Instructions (Signed)
Starting lamisil for toenail fungus  One tablet daily   You will need Liver tests in 6 weeks to monitor for safety   It will take a minimum of  3 months.   Use the gentle filing method after showers/bathing and dab on vinegar daily to speed things up   Podiatry referral to help get your nails trimmed safely

## 2014-05-15 NOTE — Progress Notes (Signed)
Patient ID: Jacob Baker, male   DOB: 08/11/1970, 44 y.o.   MRN: [id number]  Patient Active Problem List   Diagnosis Date Noted  . Onychomycosis with ingrown toenail 05/17/2014  . Onychomycosis 04/18/2014  . Unexplained night sweats 03/13/2014  . Functional abdominal pain syndrome 11/26/2013  . Impaired fasting glucose 11/26/2013  . Tobacco abuse 11/24/2013  . Hiatal hernia 11/15/2013  . Tubular adenoma of colon 11/15/2013  . Helicobacter pylori gastritis 10/18/2013  . Gastritis due to nonsteroidal anti-inflammatory drug (NSAID) 10/14/2013  . Hypokalemia 10/14/2013  . HTN (hypertension) 04/26/2013  . Anxiety 04/26/2013    Subjective:  CC:   Chief Complaint  Patient presents with  . Acute Visit    Bilateral toe nail thick and discolored.    HPI:   Jacob Baker is a 44 y.o. male who presents for evaluation of thickened, discolored toenails.  Condition has been present for over a year.  Has tried trimming toenails but this has become increasingly difficult.  Has tried OTC remedies for Toenail fungus with no improvement.  Wants definitive treatment     Past Medical History  Diagnosis Date  . GERD (gastroesophageal reflux disease)   . Hypertension   . Hyperlipidemia   . History of migraine   . Syncope and collapse   . CHF (congestive heart failure)     Past Surgical History  Procedure Laterality Date  . Finger amputation  2009    partial removal left index finger        The following portions of the patient's history were reviewed and updated as appropriate: Allergies, current medications, and problem list.    Review of Systems:   Patient denies headache, fevers, malaise, unintentional weight loss, skin rash, eye pain, sinus congestion and sinus pain, sore throat, dysphagia,  hemoptysis , cough, dyspnea, wheezing, chest pain, palpitations, orthopnea, edema, abdominal pain, nausea, melena, diarrhea, constipation, flank pain, dysuria, hematuria, urinary   Frequency, nocturia, numbness, tingling, seizures,  Focal weakness, Loss of consciousness,  Tremor, insomnia, depression, anxiety, and suicidal ideation.     History   Social History  . Marital Status: Single    Spouse Name: N/A  . Number of Children: N/A  . Years of Education: N/A   Occupational History  . Not on file.   Social History Main Topics  . Smoking status: Current Every Day Smoker -- 2.00 packs/day for 27 years    Types: Cigarettes  . Smokeless tobacco: Not on file  . Alcohol Use: No  . Drug Use: No  . Sexual Activity: Not on file   Other Topics Concern  . Not on file   Social History Narrative    Objective:  Filed Vitals:   05/15/14 1643  BP: 126/92  Pulse: 75  Temp: 98.5 F (36.9 C)  Resp: 14     General appearance: alert, cooperative and appears stated age Lungs: clear to auscultation bilaterally Heart: regular rate and rhythm, S1, S2 normal, no murmur, click, rub or gallop Abdomen: soft, non-tender; bowel sounds normal; no masses,  no organomegaly Pulses: 2+ and symmetric Skin: Skin color, texture, turgor normal. No rashes or lesions Feet: severe dystrophy of all toenails, with overgrowth, ingrowing  and discoloration consistent with onychomycosis Pulses;  Normal  Uero: sensation intact  Lymph nodes: Cervical, supraclavicular, and axillary nodes normal.  Assessment and Plan:  Onychomycosis with ingrown toenail Discussed the treatment of fungal toenail infections, requiring prolonged use of antifungal medication and monitoring of liver function.  He is not  taking a statin or other medication known to be cleared by lliver.  Will prescribe Lamisil ,  And will also have patient see Podiatry for help in managing severe dystrophy of all nails.     Updated Medication List Outpatient Encounter Prescriptions as of 05/15/2014  Medication Sig  . amoxicillin (AMOXIL) 500 MG capsule Take 500 mg by mouth 3 (three) times daily.  Marland Kitchen aspirin 81 MG tablet Take  81 mg by mouth daily.  Marland Kitchen buPROPion (WELLBUTRIN SR) 150 MG 12 hr tablet Take 200 mg by mouth 2 (two) times daily.  . cyanocobalamin 1000 MCG tablet Take 100 mcg by mouth daily.  Marland Kitchen glucose blood (ONETOUCH VERIO) test strip Test sugars twice daily  . ONETOUCH DELICA LANCETS 51O MISC Test sugars twice daily  . terbinafine (LAMISIL) 250 MG tablet Take 1 tablet (250 mg total) by mouth daily.     Orders Placed This Encounter  Procedures  . Ambulatory referral to Podiatry    No Follow-up on file.

## 2014-05-15 NOTE — Patient Instructions (Signed)
Keep checking your sugars.  Schedule OGTT for this Thursday.  Please come then for fasting labs between 8-9 am for testosterone and cortisol.  Following this will likely start on metformin.   Please come back for a follow-up appointment in 1 month.

## 2014-05-15 NOTE — Progress Notes (Signed)
HPI: Jacob Baker is a 44 y.o.-year-old man, for evaluation and management of low testosterone and IFG. Last visit March 2016.   Higher fasting sugars were noted on last few visits-110-170. Day time numbers are between 114-one reading of 220. FH of DM in father. Truck driver by occupation.  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 Has been trying to follow the low carb DM plate model. Still skipping meals. Last A1c was 5.8% Gained weight recently, now lost weight since last time  Also, reporting Fatigue, night sweats for the past couple of months. Staying the same without any worsening.  No prior thyroid hx. Recent TSH normal. Lost weight before due to loss of appetite, abdominal cramping, constipation, now symptoms better>>GI evaluation>>recurrent H. Pylori and GERD with hiatal hernia , now on treatment>>feeling better Headaches on and off  No changes to vision including peripheral vision 1 ppd smoking - not thinking of quitting now No prior hx low Testosterone.  Recent levels taken around mid-day were mildy low at 223 ( normal 300-890) and free Testosterone 45 ( 47-244) Feb 2016 Reports taking HGH injections for 30 days back in August 2015 to bulk up muscles. Denies any active use of steroids or Testosterone.   Depression okay, on long term wellbutrin   He admits for decreased libido at times Has no  difficulty obtaining or maintaining an erection; gets night time erections as well No trauma to testes, testicular irradiation or surgery ( hernia repair as a child) No h/o of mumps orchitis/h/o autoimmune ds. No h/o cryptorchidism He grew and went through puberty like his peers No shrinking of testes. No very small testes (<5 ml) No incomplete/delayed sexual development     No breast discomfort/gynecomastia    No loss of body hair (axillary/pubic)/decreased need for shaving No height loss No abnormal sense of smell (only allergies) No vision problems No worst HA of his life No FH of  hypogonadism/infertility  No personal h/o infertility - has 4 children No FH of hemochromatosis or pituitary tumors No excessive weight gain or loss.  No chronic diseases No chronic pain. Not on opiates, does not take steroids.  Denies alcohol use No anabolic steroids use No herbal medicines Nausea better. No LH or light headedness. Recent lytes fine. Always has been using extra salt in his diet.    No AI ds in his family, no FH of MS. No prior hx blood clots or CAD in himself.   Lab Results  Component Value Date   HGBA1C 5.8 04/23/2014   Lab Results  Component Value Date   NA 139 04/23/2014   K 4.5 04/23/2014   Lab Results  Component Value Date   TESTOSTERONE 250* 04/23/2014   Review of Systems- [ x ]  Complains of    [  ]  denies [  ] Recent weight change [ x ]  Fatigue [  ] polydipsia [  ] polyuria [  ]  nocturia [  ]  vision difficulty [  ] chest pain [  ] shortness of breath [  ] leg swelling [  ] cough [  ] nausea/vomiting [  ] diarrhea [  ] constipation [  ] abdominal pain [ x ]  tingling/numbness in extremities [ x ]  concern with feet ( wounds/sores)    PE: BP 122/78 mmHg  Pulse 85  Resp 14  Ht 6\' 1"  (1.854 m)  Wt 203 lb 12 oz (92.42 kg)  BMI 26.89 kg/m2  SpO2 97% Wt Readings  from Last 3 Encounters:  05/15/14 203 lb 12 oz (92.42 kg)  05/15/14 203 lb 12 oz (92.42 kg)  04/17/14 211 lb (95.709 kg)     ASSESSMENT: 1. Low testosterone 2. Flushing 3. IFG   Problem List Items Addressed This Visit      Endocrine   Impaired fasting glucose - Primary     Encouraged him to eat healthier meals, regular exercise and lose weight. Continue checking sugars- recent sugars qualify him for diagnosis of DM, but A1c is pre_DM range. Will confirm with 2 hour OGTT , 75 gram glucose load.  Discussed that is sugars stay elevated, will consider starting metformin to control the insulin resistance.  If Testosterone workup still c/w Hypogonadism- then starting therapy will  also help with his sugars.        Relevant Orders   Testosterone, Free, Total, SHBG   Iron and TIBC   Cortisol   ACTH     Other   Unexplained night sweats    He does have a recent low Total and free Testosterone level when checked mid day. Recheck fasting, 8-9 am this Thursday. Work note given for the OGTT testing at that time as well.   Explained diurnal variation and need to repeat levels during early morning for screening for hypogonadism. Will retest cortisol levels then. Remainder hormonal  pituitary workup was normal. If secondary hypogonadism is confirmed then will need MRI pituitary and then will start him on Testosterone treatment.  Recent transferrin as well due to new onset IFG were normal. Update TIBC.   If this workup is normal, then might screen for pheo, other rarer endocrine etiologies.  In the interim, he was asked to monitor his symptoms.          Relevant Orders   Testosterone, Free, Total, SHBG   Iron and TIBC   Cortisol   ACTH       RTC 4 weeks or sooner if symptoms persist. Appt made for today for follow up of Toe nail fungus with Dr Derrel Nip.  Lavonda Thal [facility] 05/16/2014 9:34 AM

## 2014-05-16 ENCOUNTER — Telehealth: Payer: Self-pay | Admitting: *Deleted

## 2014-05-16 NOTE — Telephone Encounter (Signed)
Pt states he needs to cancel his lab appt here and he will have labs done at [facility], closer to his house. Needs order sent to [facility] fax: [phone]. Lab ordered faxed. ACTH removed from letter per Dr. Howell Rucks, has already been done.

## 2014-05-16 NOTE — Assessment & Plan Note (Signed)
He does have a recent low Total and free Testosterone level when checked mid day. Recheck fasting, 8-9 am this Thursday. Work note given for the OGTT testing at that time as well.   Explained diurnal variation and need to repeat levels during early morning for screening for hypogonadism. Will retest cortisol levels then. Remainder hormonal  pituitary workup was normal. If secondary hypogonadism is confirmed then will need MRI pituitary and then will start him on Testosterone treatment.  Recent transferrin as well due to new onset IFG were normal. Update TIBC.   If this workup is normal, then might screen for pheo, other rarer endocrine etiologies.  In the interim, he was asked to monitor his symptoms.

## 2014-05-16 NOTE — Assessment & Plan Note (Signed)
Encouraged him to eat healthier meals, regular exercise and lose weight. Continue checking sugars- recent sugars qualify him for diagnosis of DM, but A1c is pre_DM range. Will confirm with 2 hour OGTT , 75 gram glucose load.  Discussed that is sugars stay elevated, will consider starting metformin to control the insulin resistance.  If Testosterone workup still c/w Hypogonadism- then starting therapy will also help with his sugars.

## 2014-05-17 DIAGNOSIS — L600 Ingrowing nail: Principal | ICD-10-CM

## 2014-05-17 DIAGNOSIS — B351 Tinea unguium: Secondary | ICD-10-CM | POA: Insufficient documentation

## 2014-05-17 NOTE — Assessment & Plan Note (Signed)
Discussed the treatment of fungal toenail infections, requiring prolonged use of antifungal medication and monitoring of liver function.  He is not taking a statin or other medication known to be cleared by [facility].  Will prescribe Lamisil ,  And will also have patient see Podiatry for help in managing severe dystrophy of all nails.

## 2014-05-18 NOTE — Consult Note (Signed)
Brief Consult Note: Diagnosis: Major depressive episode.   Patient was seen by consultant.   Recommend further assessment or treatment.   Orders entered.   Comments: The patient is suicidal with a plan to crush his car. Will admit to psychiatry.  Electronic Signatures: Orson Slick (MD)  (Signed 31-Jan-14 12:02)  Authored: Brief Consult Note   Last Updated: 31-Jan-14 12:02 by Orson Slick (MD)

## 2014-05-18 NOTE — H&P (Signed)
PATIENT NAME:  Jacob Baker, Jacob Baker MR#:  [id number] DATE OF BIRTH:  September 30, 1970  DATE OF ADMISSION:  02/26/2012  REFERRING PHYSICIAN: Francene Castle, MD.  ATTENDING PHYSICIAN:  Ania Levay B. Jacobi Nile, MD  IDENTIFYING DATA:  The patient is a 44 year old male with new onset of depression.   CHIEF COMPLAINT: "I have thoughts of crashing my car".   HISTORY OF PRESENT ILLNESS:  The patient reports symptoms of depression for the past 6 months.  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 He received his last unemployment check last week. There is no more money coming. He has been looking for jobs but unable to secure any. He reports being increasingly depressed with poor sleep, decreased appetite, anhedonia, feeling of guilt, hopelessness, worthlessness, low energy and concentration, social isolation, crying spells and intrusive suicidal thoughts of crashing his car. He drove to [facility] from [place] to see his primary doctor and decided to come to the hospital overwhelmed with thoughts of suicide. He denies severe anxiety or psychosis. There are no symptoms suggestive of bipolar mania. He denies alcohol, illicit substance or prescription drug abuse.   PAST PSYCHIATRIC HISTORY: None reported. He has never been hospitalized. No suicide attempts. Never treated for depression.   FAMILY PSYCHIATRIC HISTORY: None reported.   PAST MEDICAL HISTORY: Back injury 6 months ago, now resolved, hypertension.   ALLERGIES: No known drug allergies.   MEDICATIONS ON ADMISSION: Antihypertensives most likely lisinopril 10 mg daily.   SOCIAL HISTORY: He lives alone in a rented apartment. He will need rent money soon.  He has never been married but has children, so child support is pending as well. He used to work as a Administrator. He is currently unemployed, has no insurance.   REVIEW OF  SYSTEMS: CONSTITUTIONAL: No fevers or chills. No weight changes.  EYES: No double or blurred vision.  ENT: No hearing loss.  RESPIRATORY: No shortness of breath or cough.  CARDIOVASCULAR: No chest pain or orthopnea.  GASTROINTESTINAL: No abdominal pain, nausea, vomiting or diarrhea.  GENITOURINARY: No incontinence or frequency.  ENDOCRINE: No heat or cold intolerance.  LYMPHATIC: No anemia or easy bruising.  INTEGUMENTARY: No acne or rash.  MUSCULOSKELETAL: No muscle or joint pain.  History of  back pain.  NEUROLOGIC: No tingling or weakness.  PSYCHIATRIC: See history of present illness for details.   PHYSICAL EXAMINATION: VITAL SIGNS: Blood pressure 158/98, pulse 76, respirations 16, temperature 96.58.  GENERAL: This is a well-developed male in no acute distress.  HEENT: The pupils are equal, round and reactive to light. Sclerae anicteric.  NECK: Supple. No thyromegaly.  LUNGS: Clear to auscultation. No dullness to percussion.  HEART: Regular rhythm and rate. No murmurs, rubs or gallops.  ABDOMEN: Soft, nontender, nondistended. Positive bowel sounds.  MUSCULOSKELETAL: Normal muscle strength in all extremities.  SKIN: No rashes or bruises.  LYMPHATIC: No cervical adenopathy.  NEUROLOGIC: Cranial nerves II through XII are intact.   LABORATORY AND DIAGNOSTIC DATA: Chemistries within normal limits except for blood glucose of 117. LFTs within normal limits. TSH 1.42. Urine tox screen negative for substances. CBC within normal limits except for white blood count of 11.2. Serum acetaminophen less than 2.0, serum salicylates 5.8.   MENTAL STATUS EXAMINATION ON ADMISSION: The patient is alert and oriented to person, place, time and situation. He is pleasant, polite and cooperative. There is severe  psychomotor retardation.  He maintains limited eye contact. He is well groomed, wearing hospital scrubs. His speech is slow and soft. Mood is depressed, affect flat. Thought processing is logical but  slow. Thought content: He endorses intrusive thoughts of suicide with a plan to crash his car. There are no thoughts of hurting others. There are no delusions or paranoia. There are no auditory or visual hallucinations. His cognition is grossly intact. He registers 3 out of 3 and recalls 3 out of 3 objects after 5 minutes. He can spell 'world' forwards and backwards. He knows the current president. His insight and judgment are fair.   SUICIDE RISK ASSESSMENT ON ADMISSION:  This is a patient with new onset untreated depression who experienced intrusive suicidal thoughts and decided to come to the hospital for help.   DIAGNOSES: AXIS I:  Major depressive episode.  AXIS II:  Deferred.  AXIS III:  Hypertension.  AXIS IV:  Mental illness, employment, financial.  AXIS V:  Global assessment of functioning 25.   PLAN: The patient was admitted to [facility] unit for safety, stabilization and medication management. He was initially placed on suicide precautions and was closely monitored for any unsafe behaviors. He underwent full psychiatric and risk assessment. He received pharmacotherapy, individual and group psychotherapy, substance abuse counseling and support from therapeutic milieu.   1.  Suicidal ideation: The patient is able to contract for safety.  2.  Mood:  We will start Prozac; as the patient has no insurance, the cost of medication is very important.  We will add trazodone for sleep.  3.  Hypertension. We will continue lisinopril.   DISPOSITION: He will be discharged to home. He will need a follow-up appointment in his area.   ____________________________ Wardell Honour. Bary Leriche, MD jbp:ct D: 02/26/2012 12:32:19 ET T: 02/26/2012 12:58:54 ET JOB#: [id number]  cc: Jaydis Duchene B. Bary Leriche, MD, <Dictator> Clovis Fredrickson MD ELECTRONICALLY SIGNED 03/24/2012 6:40

## 2014-05-19 NOTE — Discharge Summary (Signed)
PATIENT NAME:  Jacob Baker, Jacob Baker MR#:  [id number] DATE OF BIRTH:  1970-09-21  DATE OF ADMISSION:  03/22/2013 DATE OF DISCHARGE:  03/23/2013   ADMISSION DIAGNOSIS:  Chest pain.  DISCHARGE DIAGNOSES: 1.  Noncardiac chest pain.  2.  Hypertension.  3.  Hyperlipidemia.  4.  Tobacco dependence.   PERTINENT LABORATORY DATA:  Troponins were negative x 3.   LDL is 115, cholesterol 180, VLDL 44, HDL 21.   HOSPITAL COURSE: A 44 year old male who presented with atypical chest pain. For further details, please refer to the H and P.  1.  Chest pain. This is deemed to be noncardiac in nature as his chest pain is associated with his cough.  There is tenderness to palpation at the chest wall. Troponins were negative. Telemetry was negative. Once again, this is not thought to be cardiac in nature.  2.  Hypertension: The patient will continue outpatient medications. Blood pressures were controlled.  3.  Hyperlipidemia. His LDL is 115. We increased his pravastatin.  4.  Tobacco dependence. The patient was counseled regarding smoking. He does want to quit smoking. He was discharged with a nicotine patch.   DISCHARGE MEDICATIONS: 1.  Lisinopril 20 daily.  2.  Aspirin 81 mg daily.  3.  Pravastatin 20 mg 2 tablets at bedtime.  4.  Nicotine patch 21 mg per 24 hours.   DISCHARGE DIET: Low sodium.   DISCHARGE ACTIVITY: As tolerated.   DISCHARGE FOLLOWUP:  The patient will follow up with Dr. Hardin Negus in 1 week.   The patient is medically stable for discharge.   TIME SPENT: 35 minutes.    ____________________________ Donell Beers. Benjie Karvonen, MD spm:dmm D: 03/23/2013 13:56:02 ET T: 03/23/2013 21:59:32 ET JOB#: [id number]  cc: Sinclair Arrazola P. Benjie Karvonen, MD, <Dictator> Morton Peters., MD Donell Beers Thatiana Renbarger MD ELECTRONICALLY SIGNED 03/24/2013 13:58

## 2014-05-19 NOTE — Discharge Summary (Signed)
PATIENT NAME:  Jacob Baker, Jacob Baker MR#:  [id number] DATE OF BIRTH:  02-Nov-1970  DATE OF ADMISSION:  08/09/2013 DATE OF DISCHARGE:  08/13/2013  HOSPITAL COURSE: See dictated history and physical for details of admission. A 44 year old man with a history of major depression who came back to the hospital stating that he had been increasingly depressed for some time and feeling hopeless, been feeling run down mentally impaired, unable to function and having suicidal thoughts. The patient had not been compliant with outpatient treatment and had not been on medication. He was admitted to the hospital initially on suicide precautions. He did not engage in any dangerous or suicidal behavior while on the ward. He participated appropriately in treatment including group and individual therapy. Because of his concern about medication potentially causing him to feel sleepy, he was started on Wellbutrin 150 mg once a day. Also continued on his blood pressure medicine. The patient tolerated medicine without any side effects. He is currently stating that his mood feels much better. Denies suicidal ideation. Feels more optimistic and hopeful. He has been counseled about the importance of staying active in treatment and following up with outpatient providers. I am going to increase his Wellbutrin to a full dose of 150 mg in the morning and 150s at midday at the time of discharge. Continue blood pressure medicine. The patient counseled about all of this and agrees to the plan.   MENTAL STATUS EXAMINATION AT DISCHARGE: Neatly dressed and groomed gentleman, looks his stated age, cooperative with the interview. Good eye contact. Normal psychomotor activity. Speech normal rate, tone, and volume. Affect euthymic, appropriate. Mood stated as being fine. Thoughts are lucid with no loosening of associations or delusions. Denies auditory or visual hallucinations. Denies suicidal or homicidal ideation. Shows good insight and judgment. Normal  intelligence. Alert and oriented x 3. Short and long-term memory intact.   LABORATORY RESULTS: No new labs were required after admission. Admission labs included slightly elevated salicylates probably from just taking a pain medicine at 5.6. The alcohol level was negative. Chemistry panel: Slightly low potassium at 2.9. CBC slightly low hematocrit at 39 with normal hemoglobin at 13.3. The urinalysis was normal. Drug screen positive for benzodiazepines.   DISCHARGE MEDICATIONS: Wellbutrin sustained release 150 mg twice a day once in the morning and once at mid day, Norvasc 10 mg p.o. daily, aspirin 81 mg per day.   DISPOSITION: Discharge home. Follow up with [facility].   DIAGNOSIS, PRINCIPAL AND PRIMARY:   AXIS I: Major depression, recurrent, severe.   SECONDARY DIAGNOSES:  AXIS I: No further.   AXIS II: Deferred.   AXIS III: High blood pressure.   AXIS IV: Moderate from just chronic work and financial stresses.   AXIS V: Functioning at time of discharge is 60.    ____________________________ Gonzella Lex, MD jtc:lt D: 08/13/2013 13:35:35 ET T: 08/13/2013 23:00:43 ET JOB#: [id number]  cc: Gonzella Lex, MD, <Dictator> Gonzella Lex MD ELECTRONICALLY SIGNED 09/06/2013 0:36

## 2014-05-19 NOTE — H&P (Signed)
PATIENT NAME:  Jacob Baker, Jacob Baker MR#:  [id number] DATE OF BIRTH:  06/27/1970  DATE OF ADMISSION:  08/09/2013   IDENTIFYING INFORMATION AND CHIEF COMPLAINT: This is a 44 year old man who brought himself to the Emergency Room with the chief complaint of "I'm depressed and suicidal."   HISTORY OF PRESENT ILLNESS: Information obtained from the patient and the chart. The patient says he has been having suicidal thoughts for 2 or 3 weeks that are getting more and more intense and bothersome. He said he is feeling depressed most of the time. Sleep has been okay. Appetite has been okay. Not having any hallucinations or delusions. He does not have any idea what might be causing this or making him more depressed. He is feeling run down and tired more than usual. He has not been taking any medication for depression, nor seeing anybody outpatient for depression recently. No abuse of alcohol or drugs. Does not site any major stresses in his life except for financial, which he says are chronic.   PAST PSYCHIATRIC HISTORY: The patient has been in the hospital before for suicidal ideation and has a history of major depression. This has been recurrent a couple of times. Has not actually tried to kill himself. No history of psychotic features. He says that when he went for outpatient treatment, some of the medicine they gave him was helpful, but he cannot remember which one.   SUBSTANCE ABUSE HISTORY: Denies alcohol or substance abuse, or any past history of substance abuse.   PAST MEDICAL HISTORY: Has high blood pressure for which he takes regular medication. No other known ongoing medical problems.   SOCIAL HISTORY: Lives with a girlfriend. He has 4 children by other relationships, 2 of whom are staying with him over the summer. 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   CURRENT MEDICATIONS: Norvasc 10 mg a day, aspirin 81 mg a day.   ALLERGIES: No known drug allergies.   REVIEW OF SYSTEMS: Depressed mood,  fatigue, suicidal ideation. Denies hallucinations. Denies homicidal ideation. No other specific physical complaints. The rest of the review of systems is negative.   MENTAL STATUS EXAMINATION: Slightly disheveled gentleman, looks his stated age, cooperative with the interview, but sluggish. Eye contact intermittent. Psychomotor activity very slow and decreased. Affect flat. Mood stated as depressed. Very quiet speech. Positive suicidal ideation with no specific plan. No homicidal ideation. No hallucinations. Does not appear to be delusional, seems to be logical in his thinking. Alert and oriented x 4 without any evidence of delirium. Short and long-term memory intact. Normal intelligence. Alert and oriented x 4.   PHYSICAL EXAMINATION:  GENERAL: The patient does not appear to be in any physical distress.  SKIN: No skin lesions identified. He has lost the first digits of his index finger on his left hand, but no other wounds noted.  HEENT: Pupils equal and reactive. Face symmetric. Oral mucosa normal. The strength and reflexes are symmetric and normal throughout. Cranial nerves symmetric and normal. Normal gait.  LUNGS: Clear without wheezes.  HEART: Regular rate and rhythm.  ABDOMEN: Soft, nontender, normal bowel sounds.  VITAL SIGNS: Temperature on admission 98, pulse 75, respirations 18, blood pressure 134/86.   LABORATORY RESULTS: Salicylates elevated at 5.6. Alcohol negative. Chemistry panel: Elevated glucose 152 low potassium 2.9. Hematocrit low at 39. Urinalysis normal. Drug screen positive for benzodiazepines.   ASSESSMENT: A 44 year old male with a history of recurrent major depression, comes back into the hospital depressed, having suicidal thoughts, nonpsychotic,  wanting to get appropriate treatment.   TREATMENT PLAN: Admit to psychiatry. Suicide precautions. Engage in appropriate groups and activities. I am going to start him on Wellbutrin 150 mg once a day of the sustained release as an  initial treatment for depression. Work on making sure we have a good outpatient treatment plan.   DIAGNOSIS, PRINCIPAL AND PRIMARY:  AXIS I: Major depression, severe, recurrent.   SECONDARY DIAGNOSES:  AXIS I: No further.  AXIS II: No diagnosis.  AXIS III: High blood pressure.  AXIS IV: Moderate to severe from chronic financial problems.  AXIS V: Functioning at the time of evaluation is 30.    ____________________________ Gonzella Lex, MD jtc:jr D: 08/09/2013 18:36:43 ET T: 08/09/2013 19:03:29 ET JOB#: [id number]  cc: Gonzella Lex, MD, <Dictator> Gonzella Lex MD ELECTRONICALLY SIGNED 09/06/2013 0:36

## 2014-05-19 NOTE — H&P (Signed)
PATIENT NAME:  Jacob Baker, Jacob Baker MR#:  [id number] DATE OF BIRTH:  Mar 17, 1970  DATE OF ADMISSION:  03/23/2013  REFERRING PHYSICIAN: [facility].   PRIMARY CARE PHYSICIAN: Nonlocal.   CHIEF COMPLAINT: Chest pain.   HISTORY OF PRESENT ILLNESS: A 44 year old African American gentleman with past medical history of hypertension, left ventricular hypertrophy, hyperlipidemia, presenting with chest pain. Describes intermittent right-sided chest pain for approximately 1 week's duration which is mainly described as tightness and intensity ranging between 7 to 10 out of 10, radiates from neck and right chest. Worsened with a cough which he has had for approximately 1 week's duration, nonproductive. No associated fevers, chills. No real relieving factors. This is nonexertional. He is able to climb a ladder for his job without symptoms. In the Emergency Department, he received nitroglycerin x 3 as well as morphine, and his pain is now completely resolved.   REVIEW OF SYSTEMS:  CONSTITUTIONAL: Denies fever, fatigue, weakness.  EYES: Denies blurred vision, double vision, eye pain.  EARS, NOSE, THROAT: Denies tinnitus, ear pain, hearing loss.  RESPIRATORY: Positive for cough. Denies wheezing, hemoptysis, shortness of breath.  CARDIOVASCULAR: Positive for chest pain. Denies orthopnea, edema, palpitations.  GASTROINTESTINAL: Denies nausea, vomiting, diarrhea, abdominal pain.  GENITOURINARY: Denies dysuria or hematuria.  ENDOCRINE: Denies nocturia or thyroid problems.  HEMATOLOGIC AND LYMPHATIC: Denies easy bruising or bleeding.  SKIN: Denies rash or lesions.  MUSCULOSKELETAL: Denies pain in neck, back, shoulder, knees, hips or arthritic symptoms.  NEUROLOGIC: Denies paralysis, paresthesias.  PSYCHIATRIC: Denies anxiety or depressive symptoms.   Otherwise, full review of systems performed by me is negative.   PAST MEDICAL HISTORY: Hypertension, left ventricular hypertrophy, hyperlipidemia.   SOCIAL HISTORY: Positive  for tobacco usage. Denies any alcohol or drug usage.   FAMILY HISTORY: Positive for coronary artery disease.   ALLERGIES: No known drug allergies.   HOME MEDICATIONS: Aspirin 81 mg p.o. daily, lisinopril 20 mg p.o. daily, pravastatin 20 mg p.o. daily.   PHYSICAL EXAMINATION:  VITAL SIGNS: Temperature 97.9, heart rate 88, respirations 20, blood pressure 145/95, saturating 98% on room air. Weight 97.5 kg, BMI 28.4.  GENERAL: Well-nourished, well-developed, African American gentleman, currently in no acute distress.  HEAD: Normocephalic, atraumatic.  EYES: Pupils equal, round and reactive to light. Extraocular muscles intact. No scleral icterus.  MOUTH: Moist mucosal membranes. Dentition intact. No abscess noted.  EARS, NOSE, THROAT: Throat is clear without exudates. No external lesions.  NECK: Supple. No thyromegaly. No nodules. No JVD.  PULMONARY: Clear to auscultation bilaterally without wheezes, rubs, rhonchi. No use of accessory muscles. Good respiratory effort.  CHEST: Tenderness to palpation over the right chest.  CARDIOVASCULAR: S1, S2, regular rate and rhythm. No murmurs, rubs or gallops. No edema. Pedal pulses 2+ bilaterally.  GASTROINTESTINAL: Soft, nontender, nondistended. No masses. Positive bowel sounds. No hepatosplenomegaly.  MUSCULOSKELETAL: No swelling, clubbing, edema. Range of motion full in all extremities.  NEUROLOGIC: Cranial nerves II through XII intact. No gross focal neurological deficits. Sensation intact. Reflexes intact.   SKIN: No ulcerations, lesions, rash, cyanosis. Skin warm, dry. Turgor intact.  PSYCHIATRIC: Mood and affect within normal limits. The patient is awake, alert and oriented x 3. Insight and judgment intact.   LABORATORY DATA: Sodium 139,  chloride 106, bicarb 28, BUN 11, creatinine 1.03, glucose 95. Troponin 0.02. WBC 8.9, hemoglobin 13.3, platelets 239. Chest x-ray performed. No acute cardiopulmonary process.   EKG: Normal sinus rhythm, heart  rate 87. First-degree AV block. PR interval 220. Minimal voltage criteria for LVH.   ASSESSMENT  AND PLAN: A 44 year old African American gentleman with history of hypertension, left ventricular hypertrophy, hypertension. Presenting with chest pain.  1. Chest pain: Aspirin and statin therapy. Telemetry. Trend cardiac enzymes. Observation status. Given history/symptoms appears to be nonischemic chest pain, which would put him at a false positive rate of 91% for a stress test, will hold on stress testing at this time.  2. Hypokalemia: Replace potassium to goal of 4 to 5.  3. Hypertension: Lisinopril.  4. Hyperlipidemia: Statin therapy.  5. Venous thromboembolism prophylaxis with heparin subcutaneous.   The patient is FULL CODE.   TIME SPENT: 45 minutes.    ____________________________ Aaron Mose. Hower, MD dkh:gb D: 03/22/2013 23:38:50 ET T: 03/23/2013 01:42:35 ET JOB#: [id number]  cc: Aaron Mose. Hower, MD, <Dictator> Rohin Woodfin Ganja MD ELECTRONICALLY SIGNED 03/23/2013 2:43

## 2014-05-21 ENCOUNTER — Other Ambulatory Visit: Payer: BLUE CROSS/BLUE SHIELD

## 2014-05-28 ENCOUNTER — Telehealth: Payer: Self-pay | Admitting: Internal Medicine

## 2014-05-28 NOTE — Telephone Encounter (Signed)
Can we call [facility] again for labs still have not received them.

## 2014-05-28 NOTE — Telephone Encounter (Signed)
Patient called Inquiring iif lab results had been received from [facility] notified [facility] and labs are to be faxed to MD

## 2014-05-28 NOTE — Telephone Encounter (Signed)
Called again and they are faxing them now

## 2014-05-29 ENCOUNTER — Other Ambulatory Visit: Payer: Self-pay | Admitting: Endocrinology

## 2014-05-29 ENCOUNTER — Telehealth: Payer: Self-pay | Admitting: Endocrinology

## 2014-05-29 MED ORDER — METFORMIN HCL 500 MG PO TABS: 500.0000 mg | ORAL_TABLET | Freq: Two times a day (BID) | ORAL | Status: DC

## 2014-05-29 NOTE — Telephone Encounter (Signed)
Called and advised patient of results,  verbalized understanding. Also sent mychart with instructions as well.

## 2014-05-29 NOTE — Telephone Encounter (Signed)
Wanting results from glucose tolerance test.

## 2014-05-29 NOTE — Telephone Encounter (Signed)
Please could you let him know, that I just came across his results today. Labs done at [facility] ( 05/21/14) Iron panel normal.  Total Testosterone is slightly low at 277 ( 300-890), but free Testosterone is normal at 52.2 (47-244) and 1.9% (1.6-2.9%). SHBG normal. Morning cortisol is normal at 26.4.   Even though Total Testosterone levels are on the lower side, I suspect that with better diet/exercise, lowering your weight to normal BMI and with better control of sugars- your levels should improve on their own. Since free Testosterone is in normal range, you don't qualify for Testosterone replacement at this time.  Would recommend reassessing with morning sample again in 3 months.  As far as sugars are concerned- fasting sugar was elevated at 141, 2 hour glucose was elevated at 170 after a 75 gram GTT.  This places you in the IGT and prediabetes category.   Please follow the diet/exercise changes that we discussed in clinic.  Please continue checking your sugars 1 x daily at varying times ( premeals/bedtime) Start metformin at 500 mg at night time, and in 1 week increase it to 500 mg twice daily. You may notice some GI upset, bloating, nausea with this medication, which may get better in a few weeks of starting it. If you dont tolerate the med, then please let me know.  Bring sugar log/meter to upcoming appt.

## 2014-05-30 ENCOUNTER — Encounter: Payer: Self-pay | Admitting: Internal Medicine

## 2014-05-30 NOTE — Telephone Encounter (Signed)
Opened by mistake.

## 2014-06-04 ENCOUNTER — Encounter: Payer: Self-pay | Admitting: Internal Medicine

## 2014-06-04 NOTE — Telephone Encounter (Signed)
Please see email from pt. Pt unable to come at 3 today due to another appt. Please advise

## 2014-06-12 ENCOUNTER — Telehealth: Payer: Self-pay | Admitting: *Deleted

## 2014-06-12 ENCOUNTER — Encounter: Payer: Self-pay | Admitting: Endocrinology

## 2014-06-12 ENCOUNTER — Ambulatory Visit (INDEPENDENT_AMBULATORY_CARE_PROVIDER_SITE_OTHER): Payer: BLUE CROSS/BLUE SHIELD | Admitting: Endocrinology

## 2014-06-12 VITALS — BP 122/70 | HR 86 | Resp 14 | Ht 73.0 in | Wt 202.8 lb

## 2014-06-12 DIAGNOSIS — R7302 Impaired glucose tolerance (oral): Secondary | ICD-10-CM | POA: Diagnosis not present

## 2014-06-12 DIAGNOSIS — R7301 Impaired fasting glucose: Secondary | ICD-10-CM | POA: Diagnosis not present

## 2014-06-12 DIAGNOSIS — R61 Generalized hyperhidrosis: Secondary | ICD-10-CM

## 2014-06-12 NOTE — Telephone Encounter (Signed)
I do not recall ordering labs. I thought we were going to call ahead when someone is placed on the lab schedule

## 2014-06-12 NOTE — Addendum Note (Signed)
Addended by: Karlene Einstein D on: 06/12/2014 03:31 PM   Modules accepted: Orders

## 2014-06-12 NOTE — Progress Notes (Signed)
HPI: Jacob Baker is a 44 y.o.-year-old man, for evaluation and management of low testosterone and IFG. Last visit April 2016.   IGT and IFG- Higher fasting sugars were noted on last few visits-110-170. Day time numbers are between 114-one reading of 220.  fasting sugar was elevated at 141, 2 hour glucose was elevated at 170 after a 75 gram GTT- 05/21/14 FH of DM in father. Truck driver by occupation.  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 Has been trying to follow the low carb DM plate model. Still skipping meals. Last A1c was 5.8% Started on metformin 500 mg twice daily around 05/29/14- he has been taking 500 mg at night time only and now seems to be tolerating the metformin better wrt GI symptoms. Occasional bloating, diarrhea resolved, 4 days of left sided abdominal pain without associated signs, no icterus, getting better, no upper GI symptoms,  Sugars now are ranging- 112-150 Bought TD at home- plans to start using it Watching his diet now more closely   Also, reporting Fatigue, night sweats for the past couple of months. Staying the same without any worsening.  No prior thyroid hx. Recent TSH normal. Lost weight before due to loss of appetite, abdominal cramping, constipation, now symptoms better>>GI evaluation>>recurrent H. Pylori and GERD with hiatal hernia , now on treatment>>feeling better Headaches on and off  No changes to vision including peripheral vision 1 ppd smoking - not thinking of quitting now No prior hx low Testosterone.  Recent levels taken around mid-day were mildy low at 223 ( normal 300-890) and free Testosterone 45 ( 47-244) Feb 2016 Repeat levels done at [facility] (05/21/14): Total Testosterone is slightly low at 277 ( 300-890), but free Testosterone is normal at 52.2 (47-244) and 1.9% (1.6-2.9%). SHBG normal. Morning cortisol is normal at 26.4.   Reports taking HGH injections for 30 days back in August 2015 to bulk up muscles. Denies any active use of steroids or  Testosterone.   Depression okay, on long term wellbutrin   He admits for decreased libido at times Has no  difficulty obtaining or maintaining an erection; gets night time erections as well No trauma to testes, testicular irradiation or surgery ( hernia repair as a child) No h/o of mumps orchitis/h/o autoimmune ds. No h/o cryptorchidism He grew and went through puberty like his peers No shrinking of testes. No very small testes (<5 ml) No incomplete/delayed sexual development     No breast discomfort/gynecomastia    No loss of body hair (axillary/pubic)/decreased need for shaving No height loss No abnormal sense of smell (only allergies) No vision problems No worst HA of his life No FH of hypogonadism/infertility  No personal h/o infertility - has 4 children No FH of hemochromatosis or pituitary tumors No excessive weight gain or loss.  No chronic diseases No chronic pain. Not on opiates, does not take steroids.  Denies alcohol use No anabolic steroids use No herbal medicines Nausea better. No LH or light headedness. Recent lytes fine. Always has been using extra salt in his diet.    No AI ds in his family, no FH of MS. No prior hx blood clots or CAD in himself.   Lab Results  Component Value Date   HGBA1C 5.8 04/23/2014   Lab Results  Component Value Date   NA 139 04/23/2014   K 4.5 04/23/2014   Lab Results  Component Value Date   TESTOSTERONE 250* 04/23/2014   Review of Systems- [ x ]  Complains of    [  ]  denies [  ] Recent weight change [ x ]  Fatigue [  ] polydipsia [  ] polyuria [  ]  nocturia [  ]  vision difficulty [  ] chest pain [ x ] shortness of breath [  ] leg swelling [  ] cough [  ] nausea/vomiting [  ] diarrhea [  ] constipation [ x ] abdominal pain [  ]  tingling/numbness in extremities [  ]  concern with feet ( wounds/sores)    PE: BP 122/70 mmHg  Pulse 86  Resp 14  Ht 6\' 1"  (1.854 m)  Wt 202 lb 12 oz (91.967 kg)  BMI 26.76 kg/m2  SpO2  98% Wt Readings from Last 3 Encounters:  06/12/14 202 lb 12 oz (91.967 kg)  05/15/14 203 lb 12 oz (92.42 kg)  05/15/14 203 lb 12 oz (92.42 kg)   Exam: NAD,  Abdomen: left abdomen- no tenderness or rebound, no guarding, no masses felt  ASSESSMENT: 1. Low testosterone 2. Flushing 3. IFG   Problem List Items Addressed This Visit      Endocrine   IGT (impaired glucose tolerance) - Primary    Sugars are showing mild improvement after starting metformin. Now that he is tolerating it better- increase to 500 mg BID. Dietary modifications discussed in clinic- he denied [facility] lifestyle center referral for further discussion He will work on exercise as well- has brought a TD and plans to start using it. Continue checking sugars and bring meter to all future appointments        Other   Unexplained night sweats    He does have a recent low Total and free Testosterone level when checked mid day. Repeat levels were low for Total Testosterone, but normal for free Testosterone.  I suspect that he has recent onset of insulin resistance which has resulted in alterations of SHBG and sugars. With improvement in lifestyle factors, expect improvement in sugars. Recheck morning Testosterone levels. Recent pituitary workup was normal.     If this workup is normal, then might screen for pheo, other rarer endocrine etiologies.  In the interim, he was asked to monitor his symptoms.             Other Visit Diagnoses    Impaired fasting glucose            RTC 6 weeks or sooner if symptoms persist. Advised to follow up with PCP if contd abdominal pain or worsening Josalin Carneiro [facility] 06/12/2014 2:35 PM

## 2014-06-12 NOTE — Progress Notes (Signed)
Pre visit review using our clinic review tool, if applicable. No additional management support is needed unless otherwise documented below in the visit note. 

## 2014-06-12 NOTE — Assessment & Plan Note (Signed)
He does have a recent low Total and free Testosterone level when checked mid day. Repeat levels were low for Total Testosterone, but normal for free Testosterone.  I suspect that he has recent onset of insulin resistance which has resulted in alterations of SHBG and sugars. With improvement in lifestyle factors, expect improvement in sugars. Recheck morning Testosterone levels. Recent pituitary workup was normal.     If this workup is normal, then might screen for pheo, other rarer endocrine etiologies.  In the interim, he was asked to monitor his symptoms.

## 2014-06-12 NOTE — Patient Instructions (Addendum)
Keep working on current diet and exercise efforts.  Keep checking sugars, increase metformin as tolerated to prescribed dose -if abdominal pain doesn't resolve or gets worse then visit with Dr Derrel Nip Please come back for a follow-up appointment in 6 weeks

## 2014-06-12 NOTE — Assessment & Plan Note (Addendum)
Sugars are showing mild improvement after starting metformin. Now that he is tolerating it better- increase to 500 mg BID. Dietary modifications discussed in clinic- he denied [facility] lifestyle center referral for further discussion He will work on exercise as well- has brought a TD and plans to start using it. Continue checking sugars and bring meter to all future appointments

## 2014-06-12 NOTE — Telephone Encounter (Signed)
What labs and dx? Dr.phadke labs werent suppose to be collected today per Dr.phadke

## 2014-06-13 ENCOUNTER — Telehealth: Payer: Self-pay | Admitting: *Deleted

## 2014-06-13 NOTE — Telephone Encounter (Signed)
Nothing to order

## 2014-06-13 NOTE — Telephone Encounter (Signed)
Error

## 2014-06-13 NOTE — Telephone Encounter (Signed)
This pt wasn't on my lab schedule they saw Dr.phadke yesterday and he came back here to the labs and i collected what dr.phadke had ordered and later dr.phadke came back and said he wasn't suppose to get labs done (the ones she ordered for future) and that he was suppose to get labs done by you ??

## 2014-06-13 NOTE — Telephone Encounter (Signed)
FYI i only collect a chemistry tube

## 2014-06-14 ENCOUNTER — Ambulatory Visit: Payer: BLUE CROSS/BLUE SHIELD | Admitting: Endocrinology

## 2014-06-14 ENCOUNTER — Emergency Department (HOSPITAL_COMMUNITY): Payer: BLUE CROSS/BLUE SHIELD

## 2014-06-14 ENCOUNTER — Encounter (HOSPITAL_COMMUNITY): Payer: Self-pay | Admitting: Emergency Medicine

## 2014-06-14 ENCOUNTER — Emergency Department (HOSPITAL_COMMUNITY)
Admission: EM | Admit: 2014-06-14 | Discharge: 2014-06-14 | Disposition: A | Discharge status: Home / Self Care | Payer: BLUE CROSS/BLUE SHIELD | Attending: Emergency Medicine | Admitting: Emergency Medicine

## 2014-06-14 DIAGNOSIS — R0789 Other chest pain: Secondary | ICD-10-CM | POA: Insufficient documentation

## 2014-06-14 DIAGNOSIS — I509 Heart failure, unspecified: Secondary | ICD-10-CM | POA: Insufficient documentation

## 2014-06-14 DIAGNOSIS — Z8639 Personal history of other endocrine, nutritional and metabolic disease: Secondary | ICD-10-CM | POA: Insufficient documentation

## 2014-06-14 DIAGNOSIS — K219 Gastro-esophageal reflux disease without esophagitis: Secondary | ICD-10-CM | POA: Insufficient documentation

## 2014-06-14 DIAGNOSIS — G43909 Migraine, unspecified, not intractable, without status migrainosus: Secondary | ICD-10-CM | POA: Diagnosis not present

## 2014-06-14 DIAGNOSIS — R079 Chest pain, unspecified: Secondary | ICD-10-CM

## 2014-06-14 DIAGNOSIS — Z79899 Other long term (current) drug therapy: Secondary | ICD-10-CM | POA: Insufficient documentation

## 2014-06-14 DIAGNOSIS — Z720 Tobacco use: Secondary | ICD-10-CM | POA: Diagnosis not present

## 2014-06-14 DIAGNOSIS — I10 Essential (primary) hypertension: Secondary | ICD-10-CM | POA: Diagnosis not present

## 2014-06-14 DIAGNOSIS — Z7982 Long term (current) use of aspirin: Secondary | ICD-10-CM | POA: Diagnosis not present

## 2014-06-14 LAB — CBC WITH DIFFERENTIAL/PLATELET
Basophils Absolute: 0 10*3/uL (ref 0.0–0.1)
Basophils Relative: 0 % (ref 0–1)
Eosinophils Absolute: 0.2 10*3/uL (ref 0.0–0.7)
Eosinophils Relative: 3 % (ref 0–5)
HCT: 34.4 % — ABNORMAL LOW (ref 39.0–52.0)
Hemoglobin: 12.2 g/dL — ABNORMAL LOW (ref 13.0–17.0)
LYMPHS PCT: 25 % (ref 12–46)
Lymphs Abs: 1.5 10*3/uL (ref 0.7–4.0)
MCH: 29.1 pg (ref 26.0–34.0)
MCHC: 35.5 g/dL (ref 30.0–36.0)
MCV: 82.1 fL (ref 78.0–100.0)
MONOS PCT: 7 % (ref 3–12)
Monocytes Absolute: 0.4 10*3/uL (ref 0.1–1.0)
Neutro Abs: 4 10*3/uL (ref 1.7–7.7)
Neutrophils Relative %: 65 % (ref 43–77)
Platelets: 223 10*3/uL (ref 150–400)
RBC: 4.19 MIL/uL — AB (ref 4.22–5.81)
RDW: 13.3 % (ref 11.5–15.5)
WBC: 6.1 10*3/uL (ref 4.0–10.5)

## 2014-06-14 LAB — COMPREHENSIVE METABOLIC PANEL
ALK PHOS: 92 U/L (ref 38–126)
ALT: 24 U/L (ref 17–63)
AST: 17 U/L (ref 15–41)
Albumin: 4.7 g/dL (ref 3.5–5.0)
Anion gap: 9 (ref 5–15)
BUN: 10 mg/dL (ref 6–20)
CALCIUM: 9.4 mg/dL (ref 8.9–10.3)
CO2: 25 mmol/L (ref 22–32)
Chloride: 107 mmol/L (ref 101–111)
Creatinine, Ser: 0.98 mg/dL (ref 0.61–1.24)
GFR calc Af Amer: >60 mL/min (ref >60)
GFR calc non Af Amer: >60 mL/min (ref >60)
Glucose, Bld: 110 mg/dL — ABNORMAL HIGH (ref 65–99)
POTASSIUM: 3.6 mmol/L (ref 3.5–5.1)
SODIUM: 141 mmol/L (ref 135–145)
TOTAL PROTEIN: 7.2 g/dL (ref 6.5–8.1)
Total Bilirubin: 0.7 mg/dL (ref 0.3–1.2)

## 2014-06-14 LAB — TESTOSTERONE,FREE AND TOTAL
TESTOSTERONE FREE: 4.4 pg/mL — AB (ref 6.8–21.5)
TESTOSTERONE: 273 ng/dL — AB (ref 348–1197)

## 2014-06-14 LAB — TROPONIN I

## 2014-06-14 MED ORDER — ASPIRIN 81 MG PO CHEW
324.0000 mg | CHEWABLE_TABLET | Freq: Once | ORAL | Status: AC
  Administered 2014-06-14: 324 mg via ORAL
  Filled 2014-06-14: qty 4

## 2014-06-14 MED ORDER — IBUPROFEN 800 MG PO TABS: 800.0000 mg | ORAL_TABLET | Freq: Three times a day (TID) | ORAL | Status: DC

## 2014-06-14 NOTE — ED Notes (Signed)
Patient reports sharp lower left-sided chest pain that radiates across through left rib cage. Also reports shortness of breath. Patient reports chest pain started 3 hours ago while at work.

## 2014-06-14 NOTE — Discharge Instructions (Signed)

## 2014-06-14 NOTE — ED Provider Notes (Signed)
CSN: [id number]     Arrival date & time 06/14/14  1853 History   First MD Initiated Contact with Patient 06/14/14 1919     Chief Complaint  Patient presents with  . Chest Pain     (Consider location/radiation/quality/duration/timing/severity/associated sxs/prior Treatment) HPI  The patient is a 44 year old male, he has a history of hypertension which has been poorly controlled in the past, he has had multiple admissions to multiple hospitals and is followed by cardiology because of chest pain which is recurrent over time and hypertension. Most recently the patient had a stress echocardiogram in 2013 which was totally normal, ejection fraction of 70-75% and no ischemic changes. He has had multiple admissions with normal troponins, he works as a Administrator, states that he gets chest pain at rest occasionally, this occurred 3 hours ago, has been persistent, sharp, left-sided, not associated with deep breathing or movement and not associated with any recent injuries, trauma, immobilization. He does smoke cigarettes, he does endorse taking his medications as prescribed. He denies any shortness of breath nausea fevers chills coughing shortness of breath swelling of the legs orthopnea or any other complaints.  Past Medical History  Diagnosis Date  . GERD (gastroesophageal reflux disease)   . Hypertension   . Hyperlipidemia   . History of migraine   . Syncope and collapse   . CHF (congestive heart failure)    Past Surgical History  Procedure Laterality Date  . Finger amputation  2009    partial removal left index finger    Family History  Problem Relation Age of Onset  . Heart disease Father     CAD  . Hypertension Father   . Diabetes Father   . Hypertension Sister   . Hypertension Brother   . Hypertension Brother    History  Substance Use Topics  . Smoking status: Current Every Day Smoker -- 2.00 packs/day for 27 years    Types: Cigarettes  . Smokeless tobacco: Not on file  .  Alcohol Use: No    Review of Systems  All other systems reviewed and are negative.     Allergies  Review of patient's allergies indicates no known allergies.  Home Medications   Prior to Admission medications   Medication Sig Start Date End Date Taking? Authorizing Provider  aspirin 81 MG tablet Take 81 mg by mouth daily.    Historical Provider, MD  buPROPion (WELLBUTRIN SR) 150 MG 12 hr tablet Take 200 mg by mouth 2 (two) times daily.    Historical Provider, MD  cyanocobalamin 1000 MCG tablet Take 100 mcg by mouth daily.    Historical Provider, MD  glucose blood (ONETOUCH VERIO) test strip Test sugars twice daily 04/26/14   Radhika P Phadke, MD  ibuprofen (ADVIL,MOTRIN) 800 MG tablet Take 1 tablet (800 mg total) by mouth 3 (three) times daily. 06/14/14   Noemi Chapel, MD  metFORMIN (GLUCOPHAGE) 500 MG tablet Take 1 tablet (500 mg total) by mouth 2 (two) times daily with a meal. 05/29/14   Radhika P Phadke, MD  omeprazole (PRILOSEC) 20 MG capsule Take 1 capsule by mouth daily. 11/24/12   Historical Provider, MD  Jonetta Speak LANCETS 84O MISC Test sugars twice daily 04/26/14   Radhika P Phadke, MD  terbinafine (LAMISIL) 250 MG tablet Take 1 tablet (250 mg total) by mouth daily. 05/15/14   [name], MD   BP 140/93 mmHg  Pulse 78  Temp(Src) 98.2 F (36.8 C) (Oral)  Resp 18  Ht 6'  1" (1.854 m)  Wt 202 lb (91.627 kg)  BMI 26.66 kg/m2  SpO2 98% Physical Exam  Constitutional: He appears well-developed and well-nourished. No distress.  HENT:  Head: Normocephalic and atraumatic.  Mouth/Throat: Oropharynx is clear and moist. No oropharyngeal exudate.  Eyes: Conjunctivae and EOM are normal. Pupils are equal, round, and reactive to light. Right eye exhibits no discharge. Left eye exhibits no discharge. No scleral icterus.  Neck: Normal range of motion. Neck supple. No JVD present. No thyromegaly present.  Cardiovascular: Normal rate, regular rhythm, normal heart sounds and intact  distal pulses.  Exam reveals no gallop and no friction rub.   No murmur heard. Pulmonary/Chest: Effort normal and breath sounds normal. No respiratory distress. He has no wheezes. He has no rales.  Abdominal: Soft. Bowel sounds are normal. He exhibits no distension and no mass. There is no tenderness.  Musculoskeletal: Normal range of motion. He exhibits no edema or tenderness.  Lymphadenopathy:    He has no cervical adenopathy.  Neurological: He is alert. Coordination normal.  Skin: Skin is warm and dry. No rash noted. No erythema.  Psychiatric: He has a normal mood and affect. His behavior is normal.  Nursing note and vitals reviewed.   ED Course  Procedures (including critical care time) Labs Review Labs Reviewed  CBC WITH DIFFERENTIAL/PLATELET - Abnormal; Notable for the following:    RBC 4.19 (*)    Hemoglobin 12.2 (*)    HCT 34.4 (*)    All other components within normal limits  COMPREHENSIVE METABOLIC PANEL - Abnormal; Notable for the following:    Glucose, Bld 110 (*)    All other components within normal limits  TROPONIN I    Imaging Review Dg Chest 2 View  06/14/2014   CLINICAL DATA:  Sharp lower left-sided chest pain.  EXAM: CHEST  2 VIEW  COMPARISON:  03/12/2014  FINDINGS: The heart size and mediastinal contours are within normal limits. Both lungs are clear. The visualized skeletal structures are unremarkable.  IMPRESSION: No active cardiopulmonary disease.   Electronically Signed   By: Markus Daft M.D.   On: 06/14/2014 20:47     EKG Interpretation   Date/Time:  Thursday Jun 14 2014 19:10:59 EDT Ventricular Rate:  81 PR Interval:  208 QRS Duration: 88 QT Interval:  370 QTC Calculation: 429 R Axis:   37 Text Interpretation:  Normal sinus rhythm Normal ECG No significant change  was found Confirmed by Wyvonnia Dusky  MD, STEPHEN [phone]) on 06/14/2014 7:15:01  PM      MDM   Final diagnoses:  Chest pain, unspecified chest pain type    The patient has no  peripheral edema, no JVD, no chest tenderness, his lungs are clear, heart is normal, EKG does not show any signs of acute ischemia. At this time the patient appears hemodynamically stable, chest x-ray, labs, reevaluate.  Labs and xray neg - low suspicion for ACS - stable for d/c.  Meds given in ED:  Medications  aspirin chewable tablet 324 mg (324 mg Oral Given 06/14/14 1950)    New Prescriptions   IBUPROFEN (ADVIL,MOTRIN) 800 MG TABLET    Take 1 tablet (800 mg total) by mouth 3 (three) times daily.      Noemi Chapel, MD 06/14/14 2101

## 2014-06-19 ENCOUNTER — Telehealth: Payer: Self-pay

## 2014-06-19 NOTE — Telephone Encounter (Signed)
The patient dropped off paperwork and is hoping it can be filled out and mailed back or he will come by and pick it up [place] - [phone]

## 2014-06-19 NOTE — Telephone Encounter (Signed)
IN red folder in envelope.

## 2014-06-20 NOTE — Telephone Encounter (Signed)
See unrouted message  

## 2014-06-20 NOTE — Telephone Encounter (Signed)
It requries a measurement of his chest during inhalation and exhalation .  Can he fill that part in himself or will he need [place] to measure him?

## 2014-06-21 NOTE — Telephone Encounter (Signed)
Left message for patient to call office.  

## 2014-06-21 NOTE — Telephone Encounter (Signed)
Patient stated he will fill in the chest circumference.

## 2014-06-26 ENCOUNTER — Ambulatory Visit: Payer: BC Managed Care – PPO | Admitting: Internal Medicine

## 2014-06-27 NOTE — Telephone Encounter (Signed)
Mailed copy to patient on 06/26/14 and copy sent to scan

## 2014-06-29 ENCOUNTER — Telehealth: Payer: Self-pay | Admitting: Internal Medicine

## 2014-06-29 NOTE — Telephone Encounter (Signed)
Called pt, notified per Kathy's note, it was mailed 06/26/14. Advised to call back if he doesn't receive it over the weekend.

## 2014-06-29 NOTE — Telephone Encounter (Signed)
Pt called about a physical  form that was to be filled out and mailed back to him.. Please advise pt. Charna Archer

## 2014-07-20 ENCOUNTER — Encounter: Payer: Self-pay | Admitting: Internal Medicine

## 2014-07-20 ENCOUNTER — Telehealth: Payer: Self-pay | Admitting: Internal Medicine

## 2014-07-20 MED ORDER — AMLODIPINE BESYLATE 10 MG PO TABS: 10.0000 mg | ORAL_TABLET | Freq: Every day | ORAL | Status: DC

## 2014-07-20 NOTE — Telephone Encounter (Signed)
Patient notified and voiced understanding.

## 2014-07-20 NOTE — Telephone Encounter (Signed)
Dr. Cathie Olden has patient on amlodipine for BP, patient had sent to office for request for refill by MyChart patient advised to call Dr. Cathie Olden office patient stated he cannot afford to see Dr. Cathie Olden and come to MD patient ask if medication can be refilled he is out please advise.

## 2014-07-20 NOTE — Telephone Encounter (Signed)
Of course I will refill his BP medication.  Whoever refused it should have asked me first.  Refill sent

## 2014-07-24 ENCOUNTER — Ambulatory Visit: Payer: BLUE CROSS/BLUE SHIELD | Admitting: Endocrinology

## 2014-09-11 ENCOUNTER — Ambulatory Visit (INDEPENDENT_AMBULATORY_CARE_PROVIDER_SITE_OTHER): Payer: BLUE CROSS/BLUE SHIELD | Admitting: Endocrinology

## 2014-09-11 ENCOUNTER — Encounter: Payer: Self-pay | Admitting: Endocrinology

## 2014-09-11 VITALS — BP 140/92 | HR 89 | Temp 98.2°F | Ht 73.0 in | Wt 203.0 lb

## 2014-09-11 DIAGNOSIS — E291 Testicular hypofunction: Secondary | ICD-10-CM | POA: Diagnosis not present

## 2014-09-11 DIAGNOSIS — E230 Hypopituitarism: Secondary | ICD-10-CM

## 2014-09-11 DIAGNOSIS — R7989 Other specified abnormal findings of blood chemistry: Secondary | ICD-10-CM | POA: Insufficient documentation

## 2014-09-11 DIAGNOSIS — R7309 Other abnormal glucose: Secondary | ICD-10-CM

## 2014-09-11 DIAGNOSIS — R7303 Prediabetes: Secondary | ICD-10-CM

## 2014-09-11 LAB — POCT GLYCOSYLATED HEMOGLOBIN (HGB A1C): HEMOGLOBIN A1C: 5.7

## 2014-09-11 MED ORDER — METFORMIN HCL ER 500 MG PO TB24: 500.0000 mg | ORAL_TABLET | Freq: Two times a day (BID) | ORAL | Status: DC

## 2014-09-11 NOTE — Progress Notes (Signed)
Subjective:    Patient ID: Jacob Baker, male    DOB: [birth year], 44 y.o.   MRN: [id number]  HPI Pt reports he had puberty at the normal age.  He has 4 biological children.  He says he has never taken illicit androgens.  He has never been on any prescribed medication for hypogonadism.  He does not take antiandrogens or opioids.  He denies any h/o infertility, XRT, or genital infection.  He has never had surgery, or a serious injury to the head or genital area.  He does not consume alcohol.   Past Medical History  Diagnosis Date  . GERD (gastroesophageal reflux disease)   . Hypertension   . Hyperlipidemia   . History of migraine   . Syncope and collapse   . CHF (congestive heart failure)     Past Surgical History  Procedure Laterality Date  . Finger amputation  2009    partial removal left index finger     Social History   Social History  . Marital Status: Single    Spouse Name: N/A  . Number of Children: N/A  . Years of Education: N/A   Occupational History  . Not on file.   Social History Main Topics  . Smoking status: Current Every Day Smoker -- 2.00 packs/day for 27 years    Types: Cigarettes  . Smokeless tobacco: Not on file  . Alcohol Use: No  . Drug Use: No  . Sexual Activity: Not on file   Other Topics Concern  . Not on file   Social History Narrative    Current Outpatient Prescriptions on File Prior to Visit  Medication Sig Dispense Refill  . omeprazole (PRILOSEC) 20 MG capsule Take 1 capsule by mouth daily.    Marland Kitchen terbinafine (LAMISIL) 250 MG tablet Take 1 tablet (250 mg total) by mouth daily. 30 tablet 3  . amLODipine (NORVASC) 10 MG tablet Take 1 tablet (10 mg total) by mouth daily. 30 tablet 5  . aspirin 81 MG tablet Take 81 mg by mouth daily.    Marland Kitchen buPROPion (WELLBUTRIN SR) 150 MG 12 hr tablet Take 200 mg by mouth 2 (two) times daily.    . cyanocobalamin 1000 MCG tablet Take 100 mcg by mouth daily.    Marland Kitchen glucose blood (ONETOUCH VERIO) test strip  Test sugars twice daily 50 each 6  . ibuprofen (ADVIL,MOTRIN) 800 MG tablet Take 1 tablet (800 mg total) by mouth 3 (three) times daily. (Patient not taking: Reported on 09/11/2014) 21 tablet 0  . ONETOUCH DELICA LANCETS 01U MISC Test sugars twice daily 50 each 3   No current facility-administered medications on file prior to visit.    No Known Allergies  Family History  Problem Relation Age of Onset  . Heart disease Father     CAD  . Hypertension Father   . Diabetes Father   . Hypertension Sister   . Hypertension Brother   . Hypertension Brother     BP 140/92 mmHg  Pulse 89  Temp(Src) 98.2 F (36.8 C) (Oral)  Ht 6\' 1"  (1.854 m)  Wt 203 lb (92.08 kg)  BMI 26.79 kg/m2  SpO2 97%  Review of Systems Denies decreased urinary stream.  Denies weight change.  He says metformin cause diarrhea.        Objective:   Physical Exam VITAL SIGNS:  See vs page GENERAL: no distress GENITALIA: Normal male testicles, scrotum, and penis  A1c=5.7% Lab Results  Component Value Date   TESTOSTERONE  172* 09/11/2014      Assessment & Plan:  Hypogonadism, worse, central, uncertain etiology.  Hyperglycemia: well-controlled.  Diarrhea, new, due to metformin.   Patient is advised the following: Patient Instructions  i have sent a prescription to your pharmacy, to change the metformin to extended-release, which is much less likely to cause diarrhea.   blood tests are requested for you today.  We'll let you know about the results.  Please come back for a follow-up appointment in 6 months.     addendum: let's check MRI.  i can rx clomid if normal.

## 2014-09-11 NOTE — Patient Instructions (Addendum)
i have sent a prescription to your pharmacy, to change the metformin to extended-release, which is much less likely to cause diarrhea.   blood tests are requested for you today.  We'll let you know about the results.  Please come back for a follow-up appointment in 6 months.

## 2014-09-12 DIAGNOSIS — E230 Hypopituitarism: Secondary | ICD-10-CM | POA: Insufficient documentation

## 2014-09-12 LAB — PROLACTIN: Prolactin: 4.3 ng/mL (ref 2.1–17.1)

## 2014-09-12 LAB — TESTOSTERONE,FREE AND TOTAL
TESTOSTERONE FREE: 2.7 pg/mL — AB (ref 6.8–21.5)
Testosterone: 172 ng/dL — ABNORMAL LOW (ref 348–1197)

## 2014-09-12 LAB — LUTEINIZING HORMONE: LH: 3.1 m[IU]/mL (ref 1.50–9.30)

## 2014-09-21 ENCOUNTER — Ambulatory Visit (HOSPITAL_COMMUNITY)
Admission: RE | Admit: 2014-09-21 | Discharge: 2014-09-21 | Disposition: A | Discharge status: Home / Self Care | Payer: BLUE CROSS/BLUE SHIELD | Source: Ambulatory Visit | Attending: Endocrinology | Admitting: Endocrinology

## 2014-09-21 DIAGNOSIS — E230 Hypopituitarism: Secondary | ICD-10-CM | POA: Insufficient documentation

## 2014-09-21 LAB — CREATININE, SERUM
Creatinine, Ser: 1.07 mg/dL (ref 0.61–1.24)
GFR calc Af Amer: >60 mL/min (ref >60)
GFR calc non Af Amer: >60 mL/min (ref >60)

## 2014-09-21 MED ORDER — GADOBENATE DIMEGLUMINE 529 MG/ML IV SOLN
15.0000 mL | Freq: Once | INTRAVENOUS | Status: AC | PRN
  Administered 2014-09-21: 15 mL via INTRAVENOUS

## 2014-09-22 ENCOUNTER — Other Ambulatory Visit: Payer: Self-pay | Admitting: Endocrinology

## 2014-09-22 DIAGNOSIS — R7989 Other specified abnormal findings of blood chemistry: Secondary | ICD-10-CM

## 2014-09-22 MED ORDER — CLOMIPHENE CITRATE 50 MG PO TABS: ORAL_TABLET | ORAL | Status: DC

## 2014-09-24 ENCOUNTER — Encounter: Payer: Self-pay | Admitting: Endocrinology

## 2014-09-24 ENCOUNTER — Other Ambulatory Visit: Payer: Self-pay | Admitting: Endocrinology

## 2014-09-24 MED ORDER — METFORMIN HCL 500 MG PO TABS: 500.0000 mg | ORAL_TABLET | Freq: Two times a day (BID) | ORAL | Status: DC

## 2014-09-28 ENCOUNTER — Encounter: Payer: Self-pay | Admitting: Internal Medicine

## 2014-10-20 ENCOUNTER — Encounter: Payer: Self-pay | Admitting: Internal Medicine

## 2014-10-23 ENCOUNTER — Encounter: Payer: Self-pay | Admitting: Internal Medicine

## 2014-10-24 NOTE — Telephone Encounter (Signed)
Can the visit in feb 2016 be used as a physical.? It is not coded that way.?

## 2014-10-26 ENCOUNTER — Ambulatory Visit (INDEPENDENT_AMBULATORY_CARE_PROVIDER_SITE_OTHER): Payer: BLUE CROSS/BLUE SHIELD | Admitting: Family Medicine

## 2014-10-26 ENCOUNTER — Encounter: Payer: Self-pay | Admitting: Family Medicine

## 2014-10-26 VITALS — BP 114/76 | HR 82 | Temp 98.6°F | Ht 73.0 in | Wt 210.5 lb

## 2014-10-26 DIAGNOSIS — I10 Essential (primary) hypertension: Secondary | ICD-10-CM | POA: Diagnosis not present

## 2014-10-26 DIAGNOSIS — E785 Hyperlipidemia, unspecified: Secondary | ICD-10-CM | POA: Diagnosis not present

## 2014-10-26 DIAGNOSIS — Z23 Encounter for immunization: Secondary | ICD-10-CM | POA: Diagnosis not present

## 2014-10-26 DIAGNOSIS — Z01810 Encounter for preprocedural cardiovascular examination: Secondary | ICD-10-CM | POA: Insufficient documentation

## 2014-10-26 DIAGNOSIS — Z0000 Encounter for general adult medical examination without abnormal findings: Secondary | ICD-10-CM

## 2014-10-26 LAB — LIPID PANEL
Cholesterol: 203 mg/dL — ABNORMAL HIGH (ref 0–200)
HDL: 24.8 mg/dL — AB (ref >39.00)
NONHDL: 178.41
Total CHOL/HDL Ratio: 8
Triglycerides: 399 mg/dL — ABNORMAL HIGH (ref 0.0–149.0)
VLDL: 79.8 mg/dL — ABNORMAL HIGH (ref 0.0–40.0)

## 2014-10-26 LAB — LDL CHOLESTEROL, DIRECT: LDL DIRECT: 127 mg/dL

## 2014-10-26 NOTE — Progress Notes (Signed)
Pre visit review using our clinic review tool, if applicable. No additional management support is needed unless otherwise documented below in the visit note. 

## 2014-10-26 NOTE — Progress Notes (Signed)
Subjective:  Patient ID: Jacob Baker, male    DOB: 1970/11/10  Age: 44 y.o. MRN: [id number]  CC: Annual physical for work  HPI: 44 year old male with a past medical history of hypertension, anxiety, tobacco abuse presents to clinic today for an annual physical exam.  Preventative Healthcare  Immunizations: In need of Tdap and Flu today.  Prostate cancer screening: Defer to PCP to have discussion with patient regarding this.  Labs: Patient has had recent labs but Lipids were last year; will obtain lipid panel today.  Smoking/tobacco use: Current everyday smoker.  Regular dental exams: Yes.  Hypertension  Well controlled.  Medications - Norvasc 10 mg daily.   Compliance -  Yes.   PMH, Surgical Hx, Family Hx, Social History reviewed and updated as below.  Past Medical History  Diagnosis Date  . GERD (gastroesophageal reflux disease)   . Hypertension   . Hyperlipidemia   . History of migraine   . Syncope and collapse   . CHF (congestive heart failure)     Past Surgical History  Procedure Laterality Date  . Finger amputation  2009    partial removal left index finger     Family History  Problem Relation Age of Onset  . Heart disease Father     CAD  . Hypertension Father   . Diabetes Father   . Hypertension Sister   . Hypertension Brother   . Hypertension Brother     Social History  Substance Use Topics  . Smoking status: Current Every Day Smoker -- 2.00 packs/day for 27 years    Types: Cigarettes  . Smokeless tobacco: Not on file  . Alcohol Use: No   Review of Systems  Constitutional: Negative for fever.       "Hot flashes/sweats".  Respiratory: Positive for cough. Negative for shortness of breath.   Cardiovascular: Negative for chest pain.  Gastrointestinal: Negative for nausea and vomiting.   Objective:  BP 114/76 mmHg  Pulse 82  Temp(Src) 98.6 F (37 C) (Oral)  Ht 6\' 1"  (1.854 m)  Wt 210 lb 8 oz (95.482 kg)  BMI 27.78 kg/m2  SpO2  98%  BP/Weight 10/26/2014 09/11/2014 0/93/8182  Systolic BP 993 716 967  Diastolic BP 76 92 93  Wt. (Lbs) 210.5 203 202  BMI 27.78 26.79 26.66   Physical Exam  Constitutional: He is oriented to person, place, and time. He appears well-developed and well-nourished. No distress.  HENT:  Head: Normocephalic and atraumatic.  Right Ear: External ear normal.  Left Ear: External ear normal.  Nose: Nose normal.  Mouth/Throat: Oropharynx is clear and moist. No oropharyngeal exudate.  Eyes: Conjunctivae are normal. No scleral icterus.  Neck: Neck supple. No thyromegaly present.  Cardiovascular: Normal rate and regular rhythm.   Pulmonary/Chest: Effort normal and breath sounds normal. No respiratory distress. He has no wheezes. He has no rales.  Abdominal: Soft. He exhibits no distension. There is no tenderness. There is no rebound and no guarding.  Musculoskeletal: Normal range of motion. He exhibits no edema.  Neurological: He is alert and oriented to person, place, and time.  Skin: Skin is warm and dry.  Psychiatric:  Flat affect.  Vitals reviewed.  Lab Results  Component Value Date   WBC 6.1 06/14/2014   HGB 12.2* 06/14/2014   HCT 34.4* 06/14/2014   PLT 223 06/14/2014   GLUCOSE 110* 06/14/2014   CHOL 213* 03/12/2014   TRIG 321.0* 03/12/2014   HDL 27.40* 03/12/2014   LDLDIRECT 123.0 03/12/2014  LDLCALC 115* 03/23/2013   ALT 24 06/14/2014   AST 17 06/14/2014   NA 141 06/14/2014   K 3.6 06/14/2014   CL 107 06/14/2014   CREATININE 1.07 09/21/2014   BUN 10 06/14/2014   CO2 25 06/14/2014   TSH 1.66 03/12/2014   INR 0.96 10/13/2011   HGBA1C 5.7 09/11/2014   Assessment & Plan:   Problem List Items Addressed This Visit    HTN (hypertension)    Well controlled. Continue Norvasc.      Preventative health care - Primary    Tdap and flu vaccines today. Lipid panel today. Briefly discussed smoking cessation.        Other Visit Diagnoses    Need for prophylactic  vaccination with combined diphtheria-tetanus-pertussis (DTP) vaccine        Relevant Orders    Tdap vaccine greater than or equal to 7yo IM (Completed)    Encounter for immunization        Hyperlipidemia        Relevant Orders    Lipid panel      Follow-up: With PCP as scheduled  Thersa Salt, DO

## 2014-10-26 NOTE — Assessment & Plan Note (Signed)
Tdap and flu vaccines today. Lipid panel today. Briefly discussed smoking cessation.

## 2014-10-26 NOTE — Patient Instructions (Signed)
It was nice to see you today.  We will let you know the results of your cholesterol panel.  Please follow up with your PCP.  Take care  Dr. Lacinda Axon  Health Maintenance A healthy lifestyle and preventative care can promote health and wellness.  Maintain regular health, dental, and eye exams.  Eat a healthy diet. Foods like vegetables, fruits, whole grains, low-fat dairy products, and lean protein foods contain the nutrients you need and are low in calories. Decrease your intake of foods high in solid fats, added sugars, and salt. Get information about a proper diet from your health care provider, if necessary.  Regular physical exercise is one of the most important things you can do for your health. Most adults should get at least 150 minutes of moderate-intensity exercise (any activity that increases your heart rate and causes you to sweat) each week. In addition, most adults need muscle-strengthening exercises on 2 or more days a week.   Maintain a healthy weight. The body mass index (BMI) is a screening tool to identify possible weight problems. It provides an estimate of body fat based on height and weight. Your health care provider can find your BMI and can help you achieve or maintain a healthy weight. For males 20 years and older:  A BMI below 18.5 is considered underweight.  A BMI of 18.5 to 24.9 is normal.  A BMI of 25 to 29.9 is considered overweight.  A BMI of 30 and above is considered obese.  Maintain normal blood lipids and cholesterol by exercising and minimizing your intake of saturated fat. Eat a balanced diet with plenty of fruits and vegetables. Blood tests for lipids and cholesterol should begin at age 110 and be repeated every 5 years. If your lipid or cholesterol levels are high, you are over age 74, or you are at high risk for heart disease, you may need your cholesterol levels checked more frequently.Ongoing high lipid and cholesterol levels should be treated with  medicines if diet and exercise are not working.  If you smoke, find out from your health care provider how to quit. If you do not use tobacco, do not start.  Lung cancer screening is recommended for adults aged 25-80 years who are at high risk for developing lung cancer because of a history of smoking. A yearly low-dose CT scan of the lungs is recommended for people who have at least a 30-pack-year history of smoking and are current smokers or have quit within the past 15 years. A pack year of smoking is smoking an average of 1 pack of cigarettes a day for 1 year (for example, a 30-pack-year history of smoking could mean smoking 1 pack a day for 30 years or 2 packs a day for 15 years). Yearly screening should continue until the smoker has stopped smoking for at least 15 years. Yearly screening should be stopped for people who develop a health problem that would prevent them from having lung cancer treatment.  If you choose to drink alcohol, do not have more than 2 drinks per day. One drink is considered to be 12 oz (360 mL) of beer, 5 oz (150 mL) of wine, or 1.5 oz (45 mL) of liquor.  Avoid the use of street drugs. Do not share needles with anyone. Ask for help if you need support or instructions about stopping the use of drugs.  High blood pressure causes heart disease and increases the risk of stroke. Blood pressure should be checked at least  every 1-2 years. Ongoing high blood pressure should be treated with medicines if weight loss and exercise are not effective.  If you are 16-3 years old, ask your health care provider if you should take aspirin to prevent heart disease.  Diabetes screening involves taking a blood sample to check your fasting blood sugar level. This should be done once every 3 years after age 73 if you are at a normal weight and without risk factors for diabetes. Testing should be considered at a younger age or be carried out more frequently if you are overweight and have at  least 1 risk factor for diabetes.  Colorectal cancer can be detected and often prevented. Most routine colorectal cancer screening begins at the age of 58 and continues through age 8. However, your health care provider may recommend screening at an earlier age if you have risk factors for colon cancer. On a yearly basis, your health care provider may provide home test kits to check for hidden blood in the stool. A small camera at the end of a tube may be used to directly examine the colon (sigmoidoscopy or colonoscopy) to detect the earliest forms of colorectal cancer. Talk to your health care provider about this at age 33 when routine screening begins. A direct exam of the colon should be repeated every 5-10 years through age 72, unless early forms of precancerous polyps or small growths are found.  People who are at an increased risk for hepatitis B should be screened for this virus. You are considered at high risk for hepatitis B if:  You were born in a country where hepatitis B occurs often. Talk with your health care provider about which countries are considered high risk.  Your parents were born in a high-risk country and you have not received a shot to protect against hepatitis B (hepatitis B vaccine).  You have HIV or AIDS.  You use needles to inject street drugs.  You live with, or have sex with, someone who has hepatitis B.  You are a man who has sex with other men (MSM).  You get hemodialysis treatment.  You take certain medicines for conditions like cancer, organ transplantation, and autoimmune conditions.  Hepatitis C blood testing is recommended for all people born from [birth year] through 1967 and any individual with known risk factors for hepatitis C.  Healthy men should no longer receive prostate-specific antigen (PSA) blood tests as part of routine cancer screening. Talk to your health care provider about prostate cancer screening.  Testicular cancer screening is not recommended  for adolescents or adult males who have no symptoms. Screening includes self-exam, a health care provider exam, and other screening tests. Consult with your health care provider about any symptoms you have or any concerns you have about testicular cancer.  Practice safe sex. Use condoms and avoid high-risk sexual practices to reduce the spread of sexually transmitted infections (STIs).  You should be screened for STIs, including gonorrhea and chlamydia if:  You are sexually active and are younger than 24 years.  You are older than 24 years, and your health care provider tells you that you are at risk for this type of infection.  Your sexual activity has changed since you were last screened, and you are at an increased risk for chlamydia or gonorrhea. Ask your health care provider if you are at risk.  If you are at risk of being infected with HIV, it is recommended that you take a prescription medicine daily to prevent  HIV infection. This is called pre-exposure prophylaxis (PrEP). You are considered at risk if:  You are a man who has sex with other men (MSM).  You are a heterosexual man who is sexually active with multiple partners.  You take drugs by injection.  You are sexually active with a partner who has HIV.  Talk with your health care provider about whether you are at high risk of being infected with HIV. If you choose to begin PrEP, you should first be tested for HIV. You should then be tested every 3 months for as long as you are taking PrEP.  Use sunscreen. Apply sunscreen liberally and repeatedly throughout the day. You should seek shade when your shadow is shorter than you. Protect yourself by wearing long sleeves, pants, a wide-brimmed hat, and sunglasses year round whenever you are outdoors.  Tell your health care provider of new moles or changes in moles, especially if there is a change in shape or color. Also, tell your health care provider if a mole is larger than the size  of a pencil eraser.  A one-time screening for abdominal aortic aneurysm (AAA) and surgical repair of large AAAs by ultrasound is recommended for men aged 18-75 years who are current or former smokers.  Stay current with your vaccines (immunizations). Document Released: 07/11/2007 Document Revised: 01/17/2013 Document Reviewed: 06/09/2010 [facility] Patient Information 2015 [place], [place]. This information is not intended to replace advice given to you by your health care provider. Make sure you discuss any questions you have with your health care provider.

## 2014-10-26 NOTE — Assessment & Plan Note (Signed)
Well controlled. Continue Norvasc. 

## 2015-01-01 ENCOUNTER — Encounter: Payer: Self-pay | Admitting: Internal Medicine

## 2015-01-02 ENCOUNTER — Other Ambulatory Visit: Payer: Self-pay

## 2015-01-02 NOTE — Telephone Encounter (Signed)
Please advise 

## 2015-01-04 MED ORDER — AMLODIPINE BESYLATE 10 MG PO TABS: 10.0000 mg | ORAL_TABLET | Freq: Every day | ORAL | Status: DC

## 2015-02-25 ENCOUNTER — Encounter: Payer: Self-pay | Admitting: Internal Medicine

## 2015-02-26 NOTE — Telephone Encounter (Signed)
Needs Nicotine Script

## 2015-02-27 MED ORDER — NICOTINE 21 MG/24HR TD PT24: 21.0000 mg | MEDICATED_PATCH | Freq: Every day | TRANSDERMAL | Status: DC

## 2015-02-27 NOTE — Telephone Encounter (Signed)
SENT 

## 2015-03-02 ENCOUNTER — Encounter: Payer: Self-pay | Admitting: Internal Medicine

## 2015-03-04 ENCOUNTER — Other Ambulatory Visit: Payer: Self-pay | Admitting: Internal Medicine

## 2015-03-04 DIAGNOSIS — R1310 Dysphagia, unspecified: Secondary | ICD-10-CM

## 2015-03-04 NOTE — Telephone Encounter (Signed)
Tried to reach patient to schedule for this afternoon unable to reach by phone left message to call office to see if we can get him in to see MD this afternoon after 5.

## 2015-03-14 ENCOUNTER — Encounter: Payer: Self-pay | Admitting: Endocrinology

## 2015-03-14 ENCOUNTER — Ambulatory Visit (INDEPENDENT_AMBULATORY_CARE_PROVIDER_SITE_OTHER): Payer: BLUE CROSS/BLUE SHIELD | Admitting: Endocrinology

## 2015-03-14 VITALS — BP 152/88 | HR 94 | Temp 97.9°F | Ht 73.0 in | Wt 214.0 lb

## 2015-03-14 DIAGNOSIS — R7989 Other specified abnormal findings of blood chemistry: Secondary | ICD-10-CM

## 2015-03-14 DIAGNOSIS — E291 Testicular hypofunction: Secondary | ICD-10-CM | POA: Diagnosis not present

## 2015-03-14 LAB — TESTOSTERONE: TESTOSTERONE: 278.56 ng/dL — AB (ref 300.00–890.00)

## 2015-03-14 NOTE — Progress Notes (Signed)
Subjective:    Patient ID: Jacob Baker, male    DOB: May 15, 1970, 45 y.o.   MRN: [id number]  HPI Pt returns for idiopathic central hypogonadism (dx'ed 2016; he has 4 biological children, and wishes to have more; pituitary MRI was normal; he was rx'ed clomid).  Pt says wife recently miscarried last month, but they will try again soon.  He feels better in general since on the clomid.   Past Medical History  Diagnosis Date  . GERD (gastroesophageal reflux disease)   . Hypertension   . Hyperlipidemia   . History of migraine   . Syncope and collapse   . CHF (congestive heart failure) [facility])     Past Surgical History  Procedure Laterality Date  . Finger amputation  2009    partial removal left index finger     Social History   Social History  . Marital Status: Married    Spouse Name: N/A  . Number of Children: N/A  . Years of Education: N/A   Occupational History  . Not on file.   Social History Main Topics  . Smoking status: Current Every Day Smoker -- 2.00 packs/day for 27 years    Types: Cigarettes  . Smokeless tobacco: Not on file  . Alcohol Use: No  . Drug Use: No  . Sexual Activity: Not on file   Other Topics Concern  . Not on file   Social History Narrative    Current Outpatient Prescriptions on File Prior to Visit  Medication Sig Dispense Refill  . amLODipine (NORVASC) 10 MG tablet Take 1 tablet (10 mg total) by mouth daily. 30 tablet 5  . aspirin 81 MG tablet Take 81 mg by mouth daily.    Marland Kitchen buPROPion (WELLBUTRIN SR) 150 MG 12 hr tablet Take 200 mg by mouth 2 (two) times daily.    . clomiPHENE (CLOMID) 50 MG tablet 1/4 tab daily 15 tablet 3  . cyanocobalamin 1000 MCG tablet Take 100 mcg by mouth daily.    Marland Kitchen glucose blood (ONETOUCH VERIO) test strip Test sugars twice daily 50 each 6  . metFORMIN (GLUCOPHAGE) 500 MG tablet Take 1 tablet (500 mg total) by mouth 2 (two) times daily with a meal. 180 tablet 3  . nicotine (NICODERM CQ) 21 mg/24hr patch Place 1  patch (21 mg total) onto the skin daily. 90 patch 1  . omeprazole (PRILOSEC) 20 MG capsule Take 1 capsule by mouth daily.    Glory Rosebush DELICA LANCETS [id number] MISC Test sugars twice daily 50 each 3  . terbinafine (LAMISIL) 250 MG tablet Take 1 tablet (250 mg total) by mouth daily. 30 tablet 3  . ibuprofen (ADVIL,MOTRIN) 800 MG tablet Take 1 tablet (800 mg total) by mouth 3 (three) times daily. (Patient not taking: Reported on 03/14/2015) 21 tablet 0   No current facility-administered medications on file prior to visit.    No Known Allergies  Family History  Problem Relation Age of Onset  . Heart disease Father     CAD  . Hypertension Father   . Diabetes Father   . Hypertension Sister   . Hypertension Brother   . Hypertension Brother     BP 152/88 mmHg  Pulse 94  Temp(Src) 97.9 F (36.6 C) (Oral)  Ht 6\' 1"  (1.854 m)  Wt 214 lb (97.07 kg)  BMI 28.24 kg/m2  SpO2 97%  Review of Systems Denies decreased urination.    Objective:   Physical Exam VITAL SIGNS:  See vs page GENERAL:  no distress Ext: no edema      Assessment & Plan:  Hypogonadism, due for recheck.  Patient is advised the following: Patient Instructions  blood tests are requested for you today.  We'll let you know about the results.  Testosterone treatment has risks, including increased or decreased fertility (depending on the type of treatment), hair loss, prostate cancer, benign prostate enlargement, blood clots, liver problems, lower hdl ("good cholesterol"), polycythemia (opposite of anemia), sleep apnea, and behavior changes.  Please come back for a follow-up appointment in 6 months.

## 2015-03-14 NOTE — Patient Instructions (Addendum)
blood tests are requested for you today.  We'll let you know about the results.  Testosterone treatment has risks, including increased or decreased fertility (depending on the type of treatment), hair loss, prostate cancer, benign prostate enlargement, blood clots, liver problems, lower hdl ("good cholesterol"), polycythemia (opposite of anemia), sleep apnea, and behavior changes.   Please come back for a follow-up appointment in 6 months.   

## 2015-03-16 LAB — TESTOSTERONE,FREE AND TOTAL
TESTOSTERONE: 341 ng/dL — AB (ref 348–1197)
Testosterone, Free: 8.5 pg/mL (ref 6.8–21.5)

## 2015-03-18 ENCOUNTER — Encounter: Payer: Self-pay | Admitting: Internal Medicine

## 2015-03-21 ENCOUNTER — Encounter: Payer: Self-pay | Admitting: Gastroenterology

## 2015-03-21 ENCOUNTER — Ambulatory Visit (INDEPENDENT_AMBULATORY_CARE_PROVIDER_SITE_OTHER): Payer: BLUE CROSS/BLUE SHIELD | Admitting: Gastroenterology

## 2015-03-21 ENCOUNTER — Other Ambulatory Visit: Payer: Self-pay

## 2015-03-21 VITALS — BP 135/87 | HR 97 | Temp 98.8°F | Ht 73.0 in | Wt 216.0 lb

## 2015-03-21 DIAGNOSIS — R1310 Dysphagia, unspecified: Secondary | ICD-10-CM

## 2015-03-21 MED ORDER — NICOTINE 14 MG/24HR TD PT24: 14.0000 mg | MEDICATED_PATCH | Freq: Every day | TRANSDERMAL | Status: DC

## 2015-03-21 NOTE — Progress Notes (Signed)
Primary Care Physician: [name], MD  Primary Gastroenterologist:  Dr. Lucilla Lame  Chief Complaint  Patient presents with  . Dysphagia    HPI: Jacob Baker is a 45 y.o. male here this patient comes here today with a report of dysphagia. The patient has had an upper endoscopy in the past for abdominal pain but was not having dysphagia at that time. The patient states that it is worse with solids then with liquids. He denies any unexplained weight loss. The patient also reports that he has recently stop smoking and has been doing well with that.  Current Outpatient Prescriptions  Medication Sig Dispense Refill  . amLODipine (NORVASC) 10 MG tablet Take 1 tablet (10 mg total) by mouth daily. 30 tablet 5  . aspirin 81 MG tablet Take 81 mg by mouth daily.    Marland Kitchen buPROPion (WELLBUTRIN SR) 150 MG 12 hr tablet Take 200 mg by mouth 2 (two) times daily.    . clomiPHENE (CLOMID) 50 MG tablet 1/4 tab daily 15 tablet 3  . cyanocobalamin 1000 MCG tablet Take 100 mcg by mouth daily.    Marland Kitchen glucose blood (ONETOUCH VERIO) test strip Test sugars twice daily 50 each 6  . metFORMIN (GLUCOPHAGE) 500 MG tablet Take 1 tablet (500 mg total) by mouth 2 (two) times daily with a meal. 180 tablet 3  . omeprazole (PRILOSEC) 20 MG capsule Take 1 capsule by mouth daily.    Jacob Baker DELICA LANCETS [id number] MISC Test sugars twice daily 50 each 3  . ibuprofen (ADVIL,MOTRIN) 800 MG tablet Take 1 tablet (800 mg total) by mouth 3 (three) times daily. (Patient not taking: Reported on 03/14/2015) 21 tablet 0  . nicotine (NICODERM CQ - DOSED IN MG/24 HOURS) 14 mg/24hr patch Place 1 patch (14 mg total) onto the skin daily. (Patient not taking: Reported on 03/21/2015) 28 patch 0  . terbinafine (LAMISIL) 250 MG tablet Take 1 tablet (250 mg total) by mouth daily. (Patient not taking: Reported on 03/21/2015) 30 tablet 3   No current facility-administered medications for this visit.    Allergies as of 03/21/2015  . (No Known  Allergies)    ROS:  General: Negative for anorexia, weight loss, fever, chills, fatigue, weakness. ENT: Negative for hoarseness, difficulty swallowing , nasal congestion. CV: Negative for chest pain, angina, palpitations, dyspnea on exertion, peripheral edema.  Respiratory: Negative for dyspnea at rest, dyspnea on exertion, cough, sputum, wheezing.  GI: See history of present illness. GU:  Negative for dysuria, hematuria, urinary incontinence, urinary frequency, nocturnal urination.  Endo: Negative for unusual weight change.    Physical Examination:   BP 135/87 mmHg  Pulse 97  Temp(Src) 98.8 F (37.1 C) (Oral)  Ht 6\' 1"  (1.854 m)  Wt 216 lb (97.977 kg)  BMI 28.50 kg/m2  General: Well-nourished, well-developed in no acute distress.  Eyes: No icterus. Conjunctivae pink. Mouth: Oropharyngeal mucosa moist and pink , no lesions erythema or exudate. Lungs: Clear to auscultation bilaterally. Non-labored. Heart: Regular rate and rhythm, no murmurs rubs or gallops.  Abdomen: Bowel sounds are normal, nontender, nondistended, no hepatosplenomegaly or masses, no abdominal bruits or hernia , no rebound or guarding.   Extremities: No lower extremity edema. No clubbing or deformities. Neuro: Alert and oriented x 3.  Grossly intact. Skin: Warm and dry, no jaundice.   Psych: Alert and cooperative, normal mood and affect.  Labs:    Imaging Studies: No results found.  Assessment and Plan:   Jacob Baker  is a 45 y.o. y/o male  who reports having dysphagia to solids. The patient will be set up for an upper endoscopy to look for possible cause of his dysphagia. The patient has been explained the plan and agrees with it.I have discussed risks & benefits which include, but are not limited to, bleeding, infection, perforation & drug reaction.  The patient agrees with this plan & written consent will be obtained.      Note: This dictation was prepared with Dragon dictation along with smaller  phrase technology. Any transcriptional errors that result from this process are unintentional.

## 2015-03-29 ENCOUNTER — Encounter: Payer: Self-pay | Admitting: *Deleted

## 2015-04-05 NOTE — Discharge Instructions (Signed)

## 2015-04-08 ENCOUNTER — Ambulatory Visit: Payer: BLUE CROSS/BLUE SHIELD | Admitting: Anesthesiology

## 2015-04-08 ENCOUNTER — Encounter
Admission: RE | Disposition: A | Discharge status: Home / Self Care | Payer: Self-pay | Source: Ambulatory Visit | Attending: Gastroenterology

## 2015-04-08 ENCOUNTER — Ambulatory Visit
Admission: RE | Admit: 2015-04-08 | Discharge: 2015-04-08 | Disposition: A | Discharge status: Home / Self Care | Payer: BLUE CROSS/BLUE SHIELD | Source: Ambulatory Visit | Attending: Gastroenterology | Admitting: Gastroenterology

## 2015-04-08 DIAGNOSIS — K222 Esophageal obstruction: Secondary | ICD-10-CM | POA: Diagnosis not present

## 2015-04-08 DIAGNOSIS — E119 Type 2 diabetes mellitus without complications: Secondary | ICD-10-CM | POA: Insufficient documentation

## 2015-04-08 DIAGNOSIS — Z7982 Long term (current) use of aspirin: Secondary | ICD-10-CM | POA: Diagnosis not present

## 2015-04-08 DIAGNOSIS — Z87891 Personal history of nicotine dependence: Secondary | ICD-10-CM | POA: Insufficient documentation

## 2015-04-08 DIAGNOSIS — I10 Essential (primary) hypertension: Secondary | ICD-10-CM | POA: Diagnosis not present

## 2015-04-08 DIAGNOSIS — R6881 Early satiety: Secondary | ICD-10-CM | POA: Insufficient documentation

## 2015-04-08 DIAGNOSIS — K219 Gastro-esophageal reflux disease without esophagitis: Secondary | ICD-10-CM | POA: Diagnosis not present

## 2015-04-08 DIAGNOSIS — R1310 Dysphagia, unspecified: Secondary | ICD-10-CM | POA: Diagnosis not present

## 2015-04-08 DIAGNOSIS — Z79899 Other long term (current) drug therapy: Secondary | ICD-10-CM | POA: Diagnosis not present

## 2015-04-08 DIAGNOSIS — E785 Hyperlipidemia, unspecified: Secondary | ICD-10-CM | POA: Insufficient documentation

## 2015-04-08 DIAGNOSIS — Z794 Long term (current) use of insulin: Secondary | ICD-10-CM | POA: Insufficient documentation

## 2015-04-08 DIAGNOSIS — I509 Heart failure, unspecified: Secondary | ICD-10-CM | POA: Diagnosis not present

## 2015-04-08 HISTORY — PX: ESOPHAGOGASTRODUODENOSCOPY (EGD) WITH PROPOFOL: SHX5813

## 2015-04-08 HISTORY — DX: Type 2 diabetes mellitus without complications: E11.9

## 2015-04-08 LAB — GLUCOSE, CAPILLARY
GLUCOSE-CAPILLARY: 248 mg/dL — AB (ref 65–99)
GLUCOSE-CAPILLARY: 232 mg/dL — AB (ref 65–99)

## 2015-04-08 SURGERY — ESOPHAGOGASTRODUODENOSCOPY (EGD) WITH PROPOFOL
Anesthesia: Monitor Anesthesia Care | Wound class: Clean Contaminated

## 2015-04-08 MED ORDER — ACETAMINOPHEN 160 MG/5ML PO SOLN: 325.0000 mg | ORAL | Status: DC | PRN

## 2015-04-08 MED ORDER — ACETAMINOPHEN 325 MG PO TABS
325.0000 mg | ORAL_TABLET | ORAL | Status: DC | PRN
  Administered 2015-04-08: 650 mg via ORAL

## 2015-04-08 MED ORDER — GLYCOPYRROLATE 0.2 MG/ML IJ SOLN
INTRAMUSCULAR | Status: DC | PRN
  Administered 2015-04-08: 0.1 mg via INTRAVENOUS

## 2015-04-08 MED ORDER — ONDANSETRON HCL 4 MG/2ML IJ SOLN: 4.0000 mg | Freq: Once | INTRAMUSCULAR | Status: DC | PRN

## 2015-04-08 MED ORDER — ACETAMINOPHEN 325 MG PO TABS: 650.0000 mg | ORAL_TABLET | Freq: Four times a day (QID) | ORAL | Status: DC | PRN

## 2015-04-08 MED ORDER — ACETAMINOPHEN 10 MG/ML IV SOLN: 1000.0000 mg | Freq: Once | INTRAVENOUS | Status: DC

## 2015-04-08 MED ORDER — LIDOCAINE HCL (CARDIAC) 20 MG/ML IV SOLN
INTRAVENOUS | Status: DC | PRN
  Administered 2015-04-08: 50 mg via INTRAVENOUS

## 2015-04-08 MED ORDER — PROPOFOL 10 MG/ML IV BOLUS
INTRAVENOUS | Status: DC | PRN
  Administered 2015-04-08: 50 mg via INTRAVENOUS
  Administered 2015-04-08: 150 mg via INTRAVENOUS
  Administered 2015-04-08 (×2): 50 mg via INTRAVENOUS

## 2015-04-08 MED ORDER — STERILE WATER FOR IRRIGATION IR SOLN
Status: DC | PRN
  Administered 2015-04-08: 08:00:00

## 2015-04-08 MED ORDER — LACTATED RINGERS IV SOLN
INTRAVENOUS | Status: DC
  Administered 2015-04-08: 07:00:00 via INTRAVENOUS

## 2015-04-08 SURGICAL SUPPLY — 39 items
BALLN DILATOR 10-12 8 (BALLOONS)
BALLN DILATOR 12-15 8 (BALLOONS)
BALLN DILATOR 15-18 8 (BALLOONS)
BALLN DILATOR CRE 0-12 8 (BALLOONS)
BALLN DILATOR ESOPH 8 10 CRE (MISCELLANEOUS) IMPLANT
BALLOON DILATOR 12-15 8 (BALLOONS) IMPLANT
BALLOON DILATOR 15-18 8 (BALLOONS) IMPLANT
BALLOON DILATOR CRE 0-12 8 (BALLOONS) IMPLANT
BLOCK BITE 60FR ADLT L/F GRN (MISCELLANEOUS) ×2 IMPLANT
CANISTER SUCT 1200ML W/VALVE (MISCELLANEOUS) ×2 IMPLANT
FCP ESCP3.2XJMB 240X2.8X (MISCELLANEOUS)
FORCEPS BIOP RAD 4 LRG CAP 4 (CUTTING FORCEPS) ×2 IMPLANT
FORCEPS BIOP RJ4 240 W/NDL (MISCELLANEOUS)
FORCEPS ESCP3.2XJMB 240X2.8X (MISCELLANEOUS) IMPLANT
GOWN CVR UNV OPN BCK APRN NK (MISCELLANEOUS) ×2 IMPLANT
GOWN ISOL THUMB LOOP REG UNIV (MISCELLANEOUS) ×2
HEMOCLIP INSTINCT (CLIP) IMPLANT
INJECTOR VARIJECT VIN23 (MISCELLANEOUS) IMPLANT
KIT CO2 TUBING (TUBING) IMPLANT
KIT DEFENDO VALVE AND CONN (KITS) IMPLANT
KIT ENDO PROCEDURE OLY (KITS) ×2 IMPLANT
LIGATOR MULTIBAND 6SHOOTER MBL (MISCELLANEOUS) IMPLANT
MARKER SPOT ENDO TATTOO 5ML (MISCELLANEOUS) IMPLANT
PAD GROUND ADULT SPLIT (MISCELLANEOUS) IMPLANT
SNARE SHORT THROW 13M SML OVAL (MISCELLANEOUS) IMPLANT
SNARE SHORT THROW 30M LRG OVAL (MISCELLANEOUS) IMPLANT
SPOT EX ENDOSCOPIC TATTOO (MISCELLANEOUS)
SUCTION POLY TRAP 4CHAMBER (MISCELLANEOUS) IMPLANT
SYR INFLATION 60ML (SYRINGE) IMPLANT
TRAP SUCTION POLY (MISCELLANEOUS) IMPLANT
TUBING CONN 6MMX3.1M (TUBING)
TUBING SUCTION CONN 0.25 STRL (TUBING) IMPLANT
UNDERPAD 30X60 958B10 (PK) (MISCELLANEOUS) IMPLANT
VALVE BIOPSY ENDO (VALVE) IMPLANT
VARIJECT INJECTOR VIN23 (MISCELLANEOUS)
WATER AUXILLARY (MISCELLANEOUS) IMPLANT
WATER STERILE IRR 250ML POUR (IV SOLUTION) ×2 IMPLANT
WATER STERILE IRR 500ML POUR (IV SOLUTION) IMPLANT
WIRE CRE 18-20MM 8CM F G (MISCELLANEOUS) IMPLANT

## 2015-04-08 NOTE — Anesthesia Postprocedure Evaluation (Signed)
Anesthesia Post Note  Patient: Jacob Baker  Procedure(s) Performed: Procedure(s) (LRB): ESOPHAGOGASTRODUODENOSCOPY (EGD) WITH PROPOFOL with dialation (N/A)  Patient location during evaluation: PACU Anesthesia Type: MAC Level of consciousness: awake and alert Pain management: pain level controlled Vital Signs Assessment: post-procedure vital signs reviewed and stable Respiratory status: spontaneous breathing, nonlabored ventilation, respiratory function stable and patient connected to nasal cannula oxygen Cardiovascular status: stable and blood pressure returned to baseline Anesthetic complications: no    Amaryllis Dyke

## 2015-04-08 NOTE — Anesthesia Preprocedure Evaluation (Signed)
Anesthesia Evaluation  Patient identified by MRN, date of birth, ID band Patient awake    Reviewed: Allergy & Precautions, H&P , NPO status   Airway Mallampati: II  TM Distance: >3 FB Neck ROM: full    Dental   Pulmonary former smoker,    Pulmonary exam normal        Cardiovascular hypertension, Normal cardiovascular exam     Neuro/Psych    GI/Hepatic   Endo/Other  diabetes  Renal/GU      Musculoskeletal   Abdominal   Peds  Hematology   Anesthesia Other Findings   Reproductive/Obstetrics                             Anesthesia Physical Anesthesia Plan  ASA: II  Anesthesia Plan: MAC   Post-op Pain Management:    Induction:   Airway Management Planned:   Additional Equipment:   Intra-op Plan:   Post-operative Plan:   Informed Consent: I have reviewed the patients History and Physical, chart, labs and discussed the procedure including the risks, benefits and alternatives for the proposed anesthesia with the patient or authorized representative who has indicated his/her understanding and acceptance.     Plan Discussed with: CRNA  Anesthesia Plan Comments:         Anesthesia Quick Evaluation

## 2015-04-08 NOTE — Anesthesia Procedure Notes (Signed)
Procedure Name: MAC Date/Time: 04/08/2015 7:43 AM Performed by: Cameron Ali Pre-anesthesia Checklist: Patient identified, Emergency Drugs available, Suction available, Timeout performed and Patient being monitored Patient Re-evaluated:Patient Re-evaluated prior to inductionOxygen Delivery Method: Nasal cannula Placement Confirmation: positive ETCO2

## 2015-04-08 NOTE — Op Note (Signed)
[facility] Gastroenterology Patient Name: Jacob Baker Procedure Date: 04/08/2015 7:25 AM MRN: [id number] Account #: [id number] Date of Birth: 1970-04-11 Admit Type: Outpatient Age: 45 Room: [facility] OR ROOM 01 Gender: Male Note Status: Finalized Procedure:            Upper GI endoscopy Indications:          Dysphagia Providers:            Lucilla Lame, MD Referring MD:         Deborra Medina, MD (Referring MD) Medicines:            Propofol per Anesthesia Complications:        No immediate complications. Procedure:            Pre-Anesthesia Assessment:                       - Prior to the procedure, a History and Physical was                        performed, and patient medications and allergies were                        reviewed. The patient's tolerance of previous                        anesthesia was also reviewed. The risks and benefits of                        the procedure and the sedation options and risks were                        discussed with the patient. All questions were                        answered, and informed consent was obtained. Prior                        Anticoagulants: The patient has taken no previous                        anticoagulant or antiplatelet agents. ASA Grade                        Assessment: II - A patient with mild systemic disease.                        After reviewing the risks and benefits, the patient was                        deemed in satisfactory condition to undergo the                        procedure.                       After obtaining informed consent, the endoscope was                        passed under direct vision. Throughout the procedure,  the patient's blood pressure, pulse, and oxygen                        saturations were monitored continuously. The was                        introduced through the mouth, and advanced to the                        second part of duodenum.  The upper GI endoscopy was                        accomplished without difficulty. The patient tolerated                        the procedure well. Findings:      One mild benign-appearing, intrinsic stenosis was found in the upper       third of the esophagus. And was traversed. A guidewire was placed and       the scope was withdrawn. Dilation was performed with a Savary dilator       with no resistance at 54 Fr.      A large amount of food (residue) was found in the entire examined       stomach.      The examined duodenum was normal.      Biopsies were taken with a cold forceps in the mid esophagus for       histology. Impression:           - Benign-appearing esophageal stenosis. Dilated.                       - A large amount of food (residue) in the stomach.                       - Normal examined duodenum.                       - No specimens collected. Recommendation:       - Watch for fevers, chills, chest pain or bleeding.                       - Await pathology results. Procedure Code(s):    --- Professional ---                       [phone], Esophagogastroduodenoscopy, flexible, transoral;                        with insertion of guide wire followed by passage of                        dilator(s) through esophagus over guide wire                       [id number], Esophagogastroduodenoscopy, flexible, transoral;                        with biopsy, single or multiple Diagnosis Code(s):    --- Professional ---                       R13.10, Dysphagia, unspecified  K22.2, Esophageal obstruction CPT copyright 2016 American Medical Association. All rights reserved. The codes documented in this report are preliminary and upon coder review may  be revised to meet current compliance requirements. Lucilla Lame, MD 04/08/2015 7:56:58 AM This report has been signed electronically. Number of Addenda: 0 Note Initiated On: 04/08/2015 7:25 AM Total Procedure Duration: 0  hours 5 minutes 35 seconds       [facility]

## 2015-04-08 NOTE — Transfer of Care (Signed)
Immediate Anesthesia Transfer of Care Note  Patient: Jacob Baker  Procedure(s) Performed: Procedure(s) with comments: ESOPHAGOGASTRODUODENOSCOPY (EGD) WITH PROPOFOL with dialation (N/A) - diabetic - oral meds  Patient Location: PACU  Anesthesia Type: MAC  Level of Consciousness: awake, alert  and patient cooperative  Airway and Oxygen Therapy: Patient Spontanous Breathing and Patient connected to supplemental oxygen  Post-op Assessment: Post-op Vital signs reviewed, Patient's Cardiovascular Status Stable, Respiratory Function Stable, Patent Airway and No signs of Nausea or vomiting  Post-op Vital Signs: Reviewed and stable  Complications: No apparent anesthesia complications

## 2015-04-08 NOTE — H&P (Signed)
[facility]  [address], [place], [place] [address] Phone: [phone] Fax : [phone]  Primary Care Physician:  [name], MD Primary Gastroenterologist:  Dr. Allen Norris  Pre-Procedure History & Physical: HPI:  Jacob Baker is a 45 y.o. male is here for an endoscopy.   Past Medical History  Diagnosis Date  . GERD (gastroesophageal reflux disease)   . Hypertension   . Hyperlipidemia   . History of migraine     none in over 2 yrs  . Syncope and collapse     x1 - approx 2-3 yrs ago - dehydration  . CHF (congestive heart failure) ([place])   . Diabetes mellitus without complication [facility])     Past Surgical History  Procedure Laterality Date  . Finger amputation  2009    partial removal left index finger   . Colonoscopy      Prior to Admission medications   Medication Sig Start Date End Date Taking? Authorizing Provider  amLODipine (NORVASC) 10 MG tablet Take 1 tablet (10 mg total) by mouth daily. 01/04/15  Yes [name], MD  aspirin 81 MG tablet Take 81 mg by mouth daily.   Yes Historical Provider, MD  buPROPion (WELLBUTRIN SR) 150 MG 12 hr tablet Take 200 mg by mouth 2 (two) times daily.   Yes Historical Provider, MD  clomiPHENE (CLOMID) 50 MG tablet 1/4 tab daily 09/22/14  Yes Renato Shin, MD  cyanocobalamin 1000 MCG tablet Take 100 mcg by mouth daily.   Yes Historical Provider, MD  glucose blood (ONETOUCH VERIO) test strip Test sugars twice daily 04/26/14  Yes Radhika P Phadke, MD  metFORMIN (GLUCOPHAGE) 500 MG tablet Take 1 tablet (500 mg total) by mouth 2 (two) times daily with a meal. 09/24/14  Yes Renato Shin, MD  [facility] DELICA LANCETS 99991111 MISC Test sugars twice daily 04/26/14  Yes Radhika Concha Se, MD  ibuprofen (ADVIL,MOTRIN) 800 MG tablet Take 1 tablet (800 mg total) by mouth 3 (three) times daily. Patient not taking: Reported on 03/14/2015 06/14/14   Noemi Chapel, MD  nicotine (NICODERM CQ - DOSED IN MG/24 HOURS) 14 mg/24hr patch Place 1  patch (14 mg total) onto the skin daily. Patient not taking: Reported on 03/21/2015 03/21/15   [name], MD  omeprazole (PRILOSEC) 20 MG capsule Take 1 capsule by mouth daily. Reported on 03/29/2015 11/24/12   Historical Provider, MD    Allergies as of 03/21/2015  . (No Known Allergies)    Family History  Problem Relation Age of Onset  . Heart disease Father     CAD  . Hypertension Father   . Diabetes Father   . Hypertension Sister   . Hypertension Brother   . Hypertension Brother     Social History   Social History  . Marital Status: Married    Spouse Name: N/A  . Number of Children: N/A  . Years of Education: N/A   Occupational History  . Not on file.   Social History Main Topics  . Smoking status: Former Smoker -- 2.00 packs/day for 27 years    Types: Cigarettes    Quit date: 02/27/2015  . Smokeless tobacco: Never Used  . Alcohol Use: No  . Drug Use: No  . Sexual Activity: Not on file   Other Topics Concern  . Not on file   Social History Narrative    Review of Systems: See HPI, otherwise negative ROS  Physical Exam: BP 152/98 mmHg  Pulse 83  Temp(Src) 97.9 F (36.6 C) (Temporal)  Resp 14  Ht 6\' 1"  (1.854 m)  Wt 215 lb (97.523 kg)  BMI 28.37 kg/m2  SpO2 100% General:   Alert,  pleasant and cooperative in NAD Head:  Normocephalic and atraumatic. Neck:  Supple; no masses or thyromegaly. Lungs:  Clear throughout to auscultation.    Heart:  Regular rate and rhythm. Abdomen:  Soft, nontender and nondistended. Normal bowel sounds, without guarding, and without rebound.   Neurologic:  Alert and  oriented x4;  grossly normal neurologically.  Impression/Plan: Jacob Baker is here for an endoscopy to be performed for dysphagia  Risks, benefits, limitations, and alternatives regarding  endoscopy have been reviewed with the patient.  Questions have been answered.  All parties agreeable.   Ollen Bowl, MD  04/08/2015, 7:37 AM

## 2015-04-09 ENCOUNTER — Encounter: Payer: Self-pay | Admitting: Gastroenterology

## 2015-04-15 ENCOUNTER — Encounter: Payer: Self-pay | Admitting: Gastroenterology

## 2015-06-13 ENCOUNTER — Other Ambulatory Visit: Payer: Self-pay | Admitting: Internal Medicine

## 2015-06-19 ENCOUNTER — Encounter: Payer: Self-pay | Admitting: Internal Medicine

## 2015-06-21 ENCOUNTER — Other Ambulatory Visit: Payer: Self-pay | Admitting: Internal Medicine

## 2015-06-21 DIAGNOSIS — E785 Hyperlipidemia, unspecified: Secondary | ICD-10-CM

## 2015-06-21 DIAGNOSIS — R5383 Other fatigue: Secondary | ICD-10-CM

## 2015-06-21 DIAGNOSIS — R739 Hyperglycemia, unspecified: Secondary | ICD-10-CM

## 2015-06-21 MED ORDER — PANTOPRAZOLE SODIUM 40 MG PO TBEC: 40.0000 mg | DELAYED_RELEASE_TABLET | Freq: Every day | ORAL | Status: DC

## 2015-06-26 ENCOUNTER — Other Ambulatory Visit (INDEPENDENT_AMBULATORY_CARE_PROVIDER_SITE_OTHER): Payer: BLUE CROSS/BLUE SHIELD

## 2015-06-26 DIAGNOSIS — R739 Hyperglycemia, unspecified: Secondary | ICD-10-CM | POA: Diagnosis not present

## 2015-06-26 DIAGNOSIS — E785 Hyperlipidemia, unspecified: Secondary | ICD-10-CM

## 2015-06-26 DIAGNOSIS — R5383 Other fatigue: Secondary | ICD-10-CM

## 2015-06-26 LAB — CBC WITH DIFFERENTIAL/PLATELET
BASOS PCT: 0.5 % (ref 0.0–3.0)
Basophils Absolute: 0 10*3/uL (ref 0.0–0.1)
EOS PCT: 1.7 % (ref 0.0–5.0)
Eosinophils Absolute: 0.1 10*3/uL (ref 0.0–0.7)
HCT: 38.2 % — ABNORMAL LOW (ref 39.0–52.0)
HEMOGLOBIN: 12.9 g/dL — AB (ref 13.0–17.0)
Lymphocytes Relative: 19.2 % (ref 12.0–46.0)
Lymphs Abs: 1.1 10*3/uL (ref 0.7–4.0)
MCHC: 33.8 g/dL (ref 30.0–36.0)
MCV: 83.3 fl (ref 78.0–100.0)
MONO ABS: 0.3 10*3/uL (ref 0.1–1.0)
MONOS PCT: 6.1 % (ref 3.0–12.0)
NEUTROS PCT: 72.5 % (ref 43.0–77.0)
Neutro Abs: 4 10*3/uL (ref 1.4–7.7)
PLATELETS: 248 10*3/uL (ref 150.0–400.0)
RBC: 4.59 Mil/uL (ref 4.22–5.81)
RDW: 13.9 % (ref 11.5–15.5)
WBC: 5.5 10*3/uL (ref 4.0–10.5)

## 2015-06-26 LAB — LIPID PANEL
CHOL/HDL RATIO: 10
CHOLESTEROL: 216 mg/dL — AB (ref 0–200)
HDL: 22.5 mg/dL — ABNORMAL LOW (ref >39.00)

## 2015-06-26 LAB — COMPREHENSIVE METABOLIC PANEL
ALBUMIN: 4.5 g/dL (ref 3.5–5.2)
ALT: 27 U/L (ref 0–53)
AST: 17 U/L (ref 0–37)
Alkaline Phosphatase: 78 U/L (ref 39–117)
BILIRUBIN TOTAL: 0.6 mg/dL (ref 0.2–1.2)
BUN: 10 mg/dL (ref 6–23)
CALCIUM: 9.3 mg/dL (ref 8.4–10.5)
CHLORIDE: 105 meq/L (ref 96–112)
CO2: 28 meq/L (ref 19–32)
CREATININE: 1.04 mg/dL (ref 0.40–1.50)
GFR: 98.38 mL/min (ref >60.00)
Glucose, Bld: 164 mg/dL — ABNORMAL HIGH (ref 70–99)
Potassium: 3.9 mEq/L (ref 3.5–5.1)
Sodium: 138 mEq/L (ref 135–145)
Total Protein: 6.4 g/dL (ref 6.0–8.3)

## 2015-06-26 LAB — MICROALBUMIN / CREATININE URINE RATIO
Creatinine,U: 129 mg/dL
MICROALB/CREAT RATIO: 1.1 mg/g (ref 0.0–30.0)
Microalb, Ur: 1.4 mg/dL (ref 0.0–1.9)

## 2015-06-26 LAB — HEMOGLOBIN A1C: HEMOGLOBIN A1C: 6 % (ref 4.6–6.5)

## 2015-06-26 LAB — LDL CHOLESTEROL, DIRECT: LDL DIRECT: 106 mg/dL

## 2015-06-28 ENCOUNTER — Encounter: Payer: Self-pay | Admitting: Internal Medicine

## 2015-07-01 ENCOUNTER — Encounter: Payer: Self-pay | Admitting: Family Medicine

## 2015-07-23 ENCOUNTER — Encounter: Payer: Self-pay | Admitting: Internal Medicine

## 2015-07-23 ENCOUNTER — Ambulatory Visit (INDEPENDENT_AMBULATORY_CARE_PROVIDER_SITE_OTHER): Payer: BLUE CROSS/BLUE SHIELD | Admitting: Internal Medicine

## 2015-07-23 VITALS — BP 146/84 | HR 90 | Temp 98.1°F | Resp 12 | Ht 73.0 in | Wt 208.0 lb

## 2015-07-23 DIAGNOSIS — Z87891 Personal history of nicotine dependence: Secondary | ICD-10-CM

## 2015-07-23 DIAGNOSIS — G629 Polyneuropathy, unspecified: Secondary | ICD-10-CM | POA: Diagnosis not present

## 2015-07-23 DIAGNOSIS — E1142 Type 2 diabetes mellitus with diabetic polyneuropathy: Secondary | ICD-10-CM | POA: Diagnosis not present

## 2015-07-23 DIAGNOSIS — I10 Essential (primary) hypertension: Secondary | ICD-10-CM

## 2015-07-23 DIAGNOSIS — B351 Tinea unguium: Secondary | ICD-10-CM

## 2015-07-23 DIAGNOSIS — E781 Pure hyperglyceridemia: Secondary | ICD-10-CM | POA: Diagnosis not present

## 2015-07-23 DIAGNOSIS — Z79899 Other long term (current) drug therapy: Secondary | ICD-10-CM

## 2015-07-23 MED ORDER — TERBINAFINE HCL 250 MG PO TABS: 250.0000 mg | ORAL_TABLET | Freq: Every day | ORAL | Status: DC

## 2015-07-23 NOTE — Progress Notes (Signed)
Subjective:  Patient ID: Jacob Baker, male    DOB: [birth year]  Age: 45 y.o. MRN: [id number]  CC: The primary encounter diagnosis was Diabetic polyneuropathy associated with type 2 diabetes mellitus ([place]). Diagnoses of Neuropathy ([place]), Long-term use of high-risk medication, Hypertriglyceridemia, Onychomycosis, History of tobacco abuse, and Essential hypertension were also pertinent to this visit.  HPI Jacob Baker presents for follow up on type 2 DM, hypertension, depression and smoking cessation   Cc;  Episodic Tingling in toes,  Burning Sensation  occurs at night,  Not keeping him up.  Occasionally in the hands. Big toes hurt at the tip.  Aggravated by having to wear steel toed shoes .  Onychomycosis:; .  Wants to resume treatment for onychomycosis. He has developed painful dystophic nails and requests podiatry referral.   Tobacco abuse: He has not smoked in 2.5 months .  No cravings  DM: checks sugars several times per week,  fastings < 130.    Outpatient Prescriptions Prior to Visit  Medication Sig Dispense Refill  . amLODipine (NORVASC) 10 MG tablet Take 1 tablet (10 mg total) by mouth daily. 30 tablet 5  . aspirin 81 MG tablet Take 81 mg by mouth daily.    Marland Kitchen buPROPion (WELLBUTRIN SR) 150 MG 12 hr tablet Take 200 mg by mouth 2 (two) times daily.    . clomiPHENE (CLOMID) 50 MG tablet 1/4 tab daily 15 tablet 3  . cyanocobalamin 1000 MCG tablet Take 100 mcg by mouth daily.    Noelle Penner NICOTINE 14 MG/24HR patch APPLY ONE PATCH TOPICALLY ONCE DAILY (REPLACES PREVIOUSLY SENT 21 MCG DOSE) 28 patch 0  . glucose blood (ONETOUCH VERIO) test strip Test sugars twice daily 50 each 6  . metFORMIN (GLUCOPHAGE) 500 MG tablet Take 1 tablet (500 mg total) by mouth 2 (two) times daily with a meal. 180 tablet 3  . omeprazole (PRILOSEC) 20 MG capsule Take 1 capsule by mouth daily. Reported on 03/29/2015    . ONETOUCH DELICA LANCETS 99991111 MISC Test sugars twice daily 50 each 3  . pantoprazole (PROTONIX) 40  MG tablet Take 1 tablet (40 mg total) by mouth daily. 30 tablet 11  . ibuprofen (ADVIL,MOTRIN) 800 MG tablet Take 1 tablet (800 mg total) by mouth 3 (three) times daily. (Patient not taking: Reported on 03/14/2015) 21 tablet 0   No facility-administered medications prior to visit.    Review of Systems;  Patient denies headache, fevers, malaise, unintentional weight loss, skin rash, eye pain, sinus congestion and sinus pain, sore throat, dysphagia,  hemoptysis , cough, dyspnea, wheezing, chest pain, palpitations, orthopnea, edema, abdominal pain, nausea, melena, diarrhea, constipation, flank pain, dysuria, hematuria, urinary  Frequency, nocturia, numbness, tingling, seizures,  Focal weakness, Loss of consciousness,  Tremor, insomnia, depression, anxiety, and suicidal ideation.      Objective:  BP 146/84 mmHg  Pulse 90  Temp(Src) 98.1 F (36.7 C) (Oral)  Resp 12  Ht 6\' 1"  (1.854 m)  Wt 208 lb (94.348 kg)  BMI 27.45 kg/m2  SpO2 96%  BP Readings from Last 3 Encounters:  07/23/15 146/84  04/08/15 117/82  03/21/15 135/87    Wt Readings from Last 3 Encounters:  07/23/15 208 lb (94.348 kg)  04/08/15 215 lb (97.523 kg)  03/21/15 216 lb (97.977 kg)    General appearance: alert, cooperative and appears stated age Ears: normal TM's and external ear canals both ears Throat: lips, mucosa, and tongue normal; teeth and gums normal Neck: no adenopathy, no carotid bruit,  supple, symmetrical, trachea midline and thyroid not enlarged, symmetric, no tenderness/mass/nodules Back: symmetric, no curvature. ROM normal. No CVA tenderness. Lungs: clear to auscultation bilaterally Heart: regular rate and rhythm, S1, S2 normal, no murmur, click, rub or gallop Abdomen: soft, non-tender; bowel sounds normal; no masses,  no organomegaly Pulses: 2+ and symmetric Skin: Skin color, texture, turgor normal. No rashes or lesions Lymph nodes: Cervical, supraclavicular, and axillary nodes normal.  Lab Results    Component Value Date   HGBA1C 6.0 06/26/2015   HGBA1C 5.7 09/11/2014   HGBA1C 5.8 04/23/2014    Lab Results  Component Value Date   CREATININE 1.04 06/26/2015   CREATININE 1.07 09/21/2014   CREATININE 0.98 06/14/2014    Lab Results  Component Value Date   WBC 5.5 06/26/2015   HGB 12.9* 06/26/2015   HCT 38.2* 06/26/2015   PLT 248.0 06/26/2015   GLUCOSE 164* 06/26/2015   CHOL 216* 06/26/2015   TRIG * 06/26/2015    443.0 Triglyceride is over 400; calculations on Lipids are invalid.   HDL 22.50* 06/26/2015   LDLDIRECT 106.0 06/26/2015   LDLCALC 115* 03/23/2013   ALT 27 06/26/2015   AST 17 06/26/2015   NA 138 06/26/2015   K 3.9 06/26/2015   CL 105 06/26/2015   CREATININE 1.04 06/26/2015   BUN 10 06/26/2015   CO2 28 06/26/2015   TSH 1.66 03/12/2014   INR 0.96 10/13/2011   HGBA1C 6.0 06/26/2015   MICROALBUR 1.4 06/26/2015    No results found.  Assessment & Plan:   Problem List Items Addressed This Visit    History of tobacco abuse    Congratulated on cessation.  He has been tobacco free for 2.5 months       Onychomycosis    Starting lamasil.  Baseline LFTS normal.  Lab Results  Component Value Date   ALT 27 06/26/2015   AST 17 06/26/2015   ALKPHOS 78 06/26/2015   BILITOT 0.6 06/26/2015         Relevant Medications   terbinafine (LAMISIL) 250 MG tablet   Neuropathy ([place])    His new onset sensory neuroapthy  May be due to DM or B12 deficiency or other cuaes.  Will rule out with other labs in 6 weeks.       Relevant Orders   Vitamin B12   Essential hypertension    Well controlled on current regimen. Renal function stable, no changes today.  Lab Results  Component Value Date   CREATININE 1.04 06/26/2015   Lab Results  Component Value Date   NA 138 06/26/2015   K 3.9 06/26/2015   CL 105 06/26/2015   CO2 28 06/26/2015         Hypertriglyceridemia    Triglycerides are elevated today to over 300.  Recommended a low glycemic index diet, weight   loss and regular exercise.  Advised t o  repeat in 6 weeks  Lab Results  Component Value Date   CHOL 223* 02/21/2014   HDL 52.20 02/21/2014   LDLDIRECT 109.0 02/21/2014   TRIG 343.0* 02/21/2014   CHOLHDL 4 02/21/2014   Lab Results  Component Value Date   ALT 20 02/21/2014   AST 24 02/21/2014   ALKPHOS 73 02/21/2014   BILITOT 0.4 02/21/2014           Relevant Orders   Lipid panel    Other Visit Diagnoses    Diabetic polyneuropathy associated with type 2 diabetes mellitus ([place])    -  Primary  Relevant Orders    Ambulatory referral to Podiatry    Long-term use of high-risk medication        Relevant Orders    Comprehensive metabolic panel       I am having Mr. Retterer start on terbinafine. I am also having him maintain his aspirin, buPROPion, ONETOUCH DELICA LANCETS 99991111, glucose blood, cyanocobalamin, omeprazole, ibuprofen, clomiPHENE, metFORMIN, amLODipine, EQ NICOTINE, and pantoprazole.  Meds ordered this encounter  Medications  . terbinafine (LAMISIL) 250 MG tablet    Sig: Take 1 tablet (250 mg total) by mouth daily.    Dispense:  90 tablet    Refill:  1    There are no discontinued medications.  Follow-up: Return in about 3 months (around 10/23/2015) for fasting labs 6 weeks , follow up diabetes.   [name], MD

## 2015-07-23 NOTE — Patient Instructions (Addendum)
Your blood pressure is elevated.  Please check it 5 times over the next two weeks and send me the readings   We are resuming lamisil for the toenail fungus and referred you to an in network podiatrist for your nails. Return in 6 weeks for liver enzyme testing and repeat fasting lipids    The tingling you are having in your feet is likely due to diabetic neuropathy,  But can also happen from B12 deficiency.  Continue your daily B12 supplements and we will check your level when you return for labs.   Congratulations on stopping smoking!   Food Choices to Lower Your Triglycerides Triglycerides are a type of fat in your blood. High levels of triglycerides can increase the risk of heart disease and stroke. If your triglyceride levels are high, the foods you eat and your eating habits are very important. Choosing the right foods can help lower your triglycerides.  WHAT GENERAL GUIDELINES DO I NEED TO FOLLOW?  Lose weight if you are overweight.   Limit or avoid alcohol.   Fill one half of your plate with vegetables and green salads.   Limit fruit to two servings a day. Choose fruit instead of juice.   Make one fourth of your plate whole grains. Look for the word "whole" as the first word in the ingredient list.  Fill one fourth of your plate with lean protein foods.  Enjoy fatty fish (such as salmon, mackerel, sardines, and tuna) three times a week.   Choose healthy fats.   Limit foods high in starch and sugar.  Eat more home-cooked food and less restaurant, buffet, and fast food.  Limit fried foods.  Cook foods using methods other than frying.  Limit saturated fats.  Check ingredient lists to avoid foods with partially hydrogenated oils (trans fats) in them. WHAT FOODS CAN I EAT?  Grains Whole grains, such as whole wheat or whole grain breads, crackers, cereals, and pasta. Unsweetened oatmeal, bulgur, barley, quinoa, or brown rice. Corn or whole wheat flour tortillas.   Vegetables Fresh or frozen vegetables (raw, steamed, roasted, or grilled). Green salads. Fruits All fresh, canned (in natural juice), or frozen fruits. Meat and Other Protein Products Ground beef (85% or leaner), grass-fed beef, or beef trimmed of fat. Skinless chicken or [place]. Ground chicken or [place]. Pork trimmed of fat. All fish and seafood. Eggs. Dried beans, peas, or lentils. Unsalted nuts or seeds. Unsalted canned or dry beans. Dairy Low-fat dairy products, such as skim or 1% milk, 2% or reduced-fat cheeses, low-fat ricotta or cottage cheese, or plain low-fat yogurt. Fats and Oils Tub margarines without trans fats. Light or reduced-fat mayonnaise and salad dressings. Avocado. Safflower, olive, or canola oils. Natural peanut or almond butter. The items listed above may not be a complete list of recommended foods or beverages. Contact your dietitian for more options. WHAT FOODS ARE NOT RECOMMENDED?  Grains White bread. White pasta. White rice. Cornbread. Bagels, pastries, and croissants. Crackers that contain trans fat. Vegetables White potatoes. Corn. Creamed or fried vegetables. Vegetables in a cheese sauce. Fruits Dried fruits. Canned fruit in light or heavy syrup. Fruit juice. Meat and Other Protein Products Fatty cuts of meat. Ribs, chicken wings, bacon, sausage, bologna, salami, chitterlings, fatback, hot dogs, bratwurst, and packaged luncheon meats. Dairy Whole or 2% milk, cream, half-and-half, and cream cheese. Whole-fat or sweetened yogurt. Full-fat cheeses. Nondairy creamers and whipped toppings. Processed cheese, cheese spreads, or cheese curds. Sweets and Desserts Corn syrup, sugars, honey, and molasses.  Candy. Jam and jelly. Syrup. Sweetened cereals. Cookies, pies, cakes, donuts, muffins, and ice cream. Fats and Oils Butter, stick margarine, lard, shortening, ghee, or bacon fat. Coconut, palm kernel, or palm oils. Beverages Alcohol. Sweetened drinks (such as sodas,  lemonade, and fruit drinks or punches). The items listed above may not be a complete list of foods and beverages to avoid. Contact your dietitian for more information.   This information is not intended to replace advice given to you by your health care provider. Make sure you discuss any questions you have with your health care provider.   Document Released: 10/31/2003 Document Revised: 02/02/2014 Document Reviewed: 11/16/2012 Elsevier Interactive Patient Education [employer].

## 2015-07-23 NOTE — Progress Notes (Signed)
Pre-visit discussion using our clinic review tool. No additional management support is needed unless otherwise documented below in the visit note.  

## 2015-07-24 DIAGNOSIS — E782 Mixed hyperlipidemia: Secondary | ICD-10-CM | POA: Insufficient documentation

## 2015-07-24 DIAGNOSIS — I10 Essential (primary) hypertension: Secondary | ICD-10-CM | POA: Insufficient documentation

## 2015-07-24 DIAGNOSIS — E781 Pure hyperglyceridemia: Secondary | ICD-10-CM

## 2015-07-24 DIAGNOSIS — G629 Polyneuropathy, unspecified: Secondary | ICD-10-CM | POA: Insufficient documentation

## 2015-07-24 DIAGNOSIS — E1169 Type 2 diabetes mellitus with other specified complication: Secondary | ICD-10-CM | POA: Insufficient documentation

## 2015-07-24 NOTE — Assessment & Plan Note (Signed)
Well controlled on current regimen. Renal function stable, no changes today.  Lab Results  Component Value Date   CREATININE 1.04 06/26/2015   Lab Results  Component Value Date   NA 138 06/26/2015   K 3.9 06/26/2015   CL 105 06/26/2015   CO2 28 06/26/2015

## 2015-07-24 NOTE — Assessment & Plan Note (Addendum)
Currently well-controlled on current medications .  hemoglobin A1c is at goal of less than 7.0 . Patient is reminded to schedule an annual eye exam and foot exam is normal today. Patient has no microalbuminuria. Patient is tolerating statin therapy for CAD risk reduction and on ACE/ARB for renal protection and hypertension   Lab Results  Component Value Date   HGBA1C 6.0 06/26/2015   Lab Results  Component Value Date   MICROALBUR 1.4 06/26/2015   Lab Results  Component Value Date   CHOL 216* 06/26/2015   HDL 22.50* 06/26/2015   LDLCALC 115* 03/23/2013   LDLDIRECT 106.0 06/26/2015   TRIG * 06/26/2015    443.0 Triglyceride is over 400; calculations on Lipids are invalid.   CHOLHDL 10 06/26/2015

## 2015-07-24 NOTE — Assessment & Plan Note (Signed)
Congratulated on cessation.  He has been tobacco free for 2.5 months

## 2015-07-24 NOTE — Assessment & Plan Note (Signed)
His new onset sensory neuroapthy  May be due to DM or B12 deficiency or other cuaes.  Will rule out with other labs in 6 weeks.

## 2015-07-24 NOTE — Assessment & Plan Note (Signed)
Starting lamasil.  Baseline LFTS normal.  Lab Results  Component Value Date   ALT 27 06/26/2015   AST 17 06/26/2015   ALKPHOS 78 06/26/2015   BILITOT 0.6 06/26/2015

## 2015-07-24 NOTE — Assessment & Plan Note (Signed)
Triglycerides are elevated today to over 300.  Recommended a low glycemic index diet, weight  loss and regular exercise.  Advised t o  repeat in 6 weeks  Lab Results  Component Value Date   CHOL 223* 02/21/2014   HDL 52.20 02/21/2014   LDLDIRECT 109.0 02/21/2014   TRIG 343.0* 02/21/2014   CHOLHDL 4 02/21/2014   Lab Results  Component Value Date   ALT 20 02/21/2014   AST 24 02/21/2014   ALKPHOS 73 02/21/2014   BILITOT 0.4 02/21/2014

## 2015-07-29 ENCOUNTER — Other Ambulatory Visit: Payer: Self-pay | Admitting: Internal Medicine

## 2015-07-29 ENCOUNTER — Telehealth: Payer: Self-pay | Admitting: Internal Medicine

## 2015-07-29 DIAGNOSIS — B351 Tinea unguium: Secondary | ICD-10-CM | POA: Diagnosis not present

## 2015-07-29 DIAGNOSIS — M79675 Pain in left toe(s): Secondary | ICD-10-CM | POA: Diagnosis not present

## 2015-07-29 DIAGNOSIS — E119 Type 2 diabetes mellitus without complications: Secondary | ICD-10-CM | POA: Diagnosis not present

## 2015-07-29 DIAGNOSIS — M79674 Pain in right toe(s): Secondary | ICD-10-CM | POA: Diagnosis not present

## 2015-07-29 MED ORDER — AMLODIPINE BESYLATE 10 MG PO TABS: 10.0000 mg | ORAL_TABLET | Freq: Every day | ORAL | Status: DC

## 2015-07-29 NOTE — Telephone Encounter (Signed)
Pt called about having his BP medication refilled. Pharmacy is [facility] in [place]. Please call his cell phone.

## 2015-07-29 NOTE — Telephone Encounter (Signed)
Refilled, left a VM for patient.

## 2015-08-16 ENCOUNTER — Ambulatory Visit: Payer: Self-pay | Admitting: Sports Medicine

## 2015-08-27 ENCOUNTER — Ambulatory Visit: Payer: BLUE CROSS/BLUE SHIELD | Admitting: Sports Medicine

## 2015-09-09 ENCOUNTER — Encounter: Payer: Self-pay | Admitting: Internal Medicine

## 2015-09-09 ENCOUNTER — Encounter: Payer: Self-pay | Admitting: Endocrinology

## 2015-09-11 ENCOUNTER — Ambulatory Visit: Payer: BC Managed Care – PPO | Admitting: Endocrinology

## 2015-09-11 DIAGNOSIS — L03032 Cellulitis of left toe: Secondary | ICD-10-CM | POA: Diagnosis not present

## 2015-09-11 DIAGNOSIS — M79672 Pain in left foot: Secondary | ICD-10-CM | POA: Diagnosis not present

## 2015-09-11 DIAGNOSIS — B351 Tinea unguium: Secondary | ICD-10-CM | POA: Diagnosis not present

## 2015-09-11 DIAGNOSIS — L600 Ingrowing nail: Secondary | ICD-10-CM | POA: Diagnosis not present

## 2015-10-14 ENCOUNTER — Encounter: Payer: Self-pay | Admitting: Internal Medicine

## 2015-10-14 ENCOUNTER — Telehealth: Payer: Self-pay | Admitting: Internal Medicine

## 2015-10-14 ENCOUNTER — Ambulatory Visit (INDEPENDENT_AMBULATORY_CARE_PROVIDER_SITE_OTHER): Payer: BLUE CROSS/BLUE SHIELD | Admitting: Internal Medicine

## 2015-10-14 VITALS — BP 130/78 | HR 96 | Temp 98.5°F | Ht 72.5 in | Wt 206.2 lb

## 2015-10-14 DIAGNOSIS — Z0000 Encounter for general adult medical examination without abnormal findings: Secondary | ICD-10-CM | POA: Diagnosis not present

## 2015-10-14 DIAGNOSIS — M25551 Pain in right hip: Secondary | ICD-10-CM

## 2015-10-14 DIAGNOSIS — I10 Essential (primary) hypertension: Secondary | ICD-10-CM

## 2015-10-14 DIAGNOSIS — B351 Tinea unguium: Secondary | ICD-10-CM | POA: Diagnosis not present

## 2015-10-14 DIAGNOSIS — Z125 Encounter for screening for malignant neoplasm of prostate: Secondary | ICD-10-CM

## 2015-10-14 DIAGNOSIS — Z23 Encounter for immunization: Secondary | ICD-10-CM | POA: Diagnosis not present

## 2015-10-14 DIAGNOSIS — E781 Pure hyperglyceridemia: Secondary | ICD-10-CM

## 2015-10-14 DIAGNOSIS — E119 Type 2 diabetes mellitus without complications: Secondary | ICD-10-CM

## 2015-10-14 DIAGNOSIS — G8929 Other chronic pain: Secondary | ICD-10-CM

## 2015-10-14 DIAGNOSIS — G629 Polyneuropathy, unspecified: Secondary | ICD-10-CM

## 2015-10-14 NOTE — Telephone Encounter (Signed)
Please advise and order, per OV today was to be done on Thursday at [facility]. thanks

## 2015-10-14 NOTE — Progress Notes (Signed)
Patient ID: Jacob Baker, male    DOB: 1970-03-04  Age: 45 y.o. MRN: [id number]  The patient is here for annual physical examination and management of other chronic and acute problems including Type 2 DM with polyneuropathy   Flu vaccine given  The risk factors are reflected in the social history.  The roster of all physicians providing medical care to patient - is listed in the Snapshot section of the chart.   Home safety : The patient has smoke detectors in the home. They wear seatbelts.  There are no firearms at home. There is no violence in the home.   There is no risks for hepatitis, STDs or HIV. There is no   history of blood transfusion. They have no travel history to infectious disease endemic areas of the world.  The patient has seen their dentist in the last six month. They have seen their eye doctor in the last year.  Discussed the need for sun protection: hats, long sleeves and use of sunscreen if there is significant sun exposure.   Diet: the importance of a healthy diet is discussed. They do have a healthy diet.  The benefits of regular aerobic exercise were discussed. He does not exercise due to a physically demanding job. .   Depression screen: there are no signs or vegative symptoms of depression- irritability, change in appetite, anhedonia, sadness/tearfullness.   The following portions of the patient's history were reviewed and updated as appropriate: allergies, current medications, past family history, past medical history,  past surgical history, past social history  and problem list.  Visual acuity was not assessed per patient preference since she has regular follow up with her ophthalmologist. Hearing and body mass index were assessed and reviewed.   During the course of the visit the patient was educated and counseled about appropriate screening and preventive services including : fall prevention , diabetes screening, nutrition counseling, colorectal cancer  screening, and recommended immunizations.    CC: The primary encounter diagnosis was Encounter for preventive health examination. Diagnoses of Neuropathy ([place]), Diabetes mellitus without complication ([place]), Hypertriglyceridemia, Prostate cancer screening, Encounter for immunization, Need for prophylactic vaccination against Streptococcus pneumoniae (pneumococcus), Onychomycosis, Essential hypertension, and Chronic pain of right hip were also pertinent to this visit.  1) onychomycosis,  On Lamisil.  Saw Jacob Baker for nails in July  Stopped lamisil due to recurrent headaches.    2) DM type 2 with neuropathy last a1c 6.0 may 31  3) triglycerides 443 in May, LDL 106  fasting glu 160 4) tobacco  abuse: abstinent for over 90 days  5) bilateral burning senstaion in thumbs and 1st finger  5) right hip aches at night , down entire leg , not during the day    History Jacob Baker has a past medical history of CHF (congestive heart failure) ([place]); Diabetes mellitus without complication ([place]); GERD (gastroesophageal reflux disease); History of migraine; Hyperlipidemia; Hypertension; and Syncope and collapse.   He has a past surgical history that includes Finger amputation (2009); Colonoscopy; and Esophagogastroduodenoscopy (egd) with propofol (N/A, 04/08/2015).   His family history includes Diabetes in his father; Heart disease in his father; Hypertension in his brother, brother, father, and sister.He reports that he quit smoking about 7 months ago. His smoking use included Cigarettes. He has a 54.00 pack-year smoking history. He has never used smokeless tobacco. He reports that he does not drink alcohol or use drugs.  Outpatient Medications Prior to Visit  Medication Sig Dispense Refill  . amLODipine (NORVASC)  10 MG tablet Take 1 tablet (10 mg total) by mouth daily. 30 tablet 5  . aspirin 81 MG tablet Take 81 mg by mouth daily.    Marland Kitchen buPROPion (WELLBUTRIN SR) 150 MG 12 hr tablet Take 200 mg by mouth 2 (two) times  daily.    . clomiPHENE (CLOMID) 50 MG tablet 1/4 tab daily 15 tablet 3  . cyanocobalamin 1000 MCG tablet Take 100 mcg by mouth daily.    Jacob Baker NICOTINE 14 MG/24HR patch APPLY ONE PATCH TOPICALLY ONCE DAILY (REPLACES PREVIOUSLY SENT 21 MCG DOSE) 28 patch 0  . glucose blood (ONETOUCH VERIO) test strip Test sugars twice daily 50 each 6  . ibuprofen (ADVIL,MOTRIN) 800 MG tablet Take 1 tablet (800 mg total) by mouth 3 (three) times daily. 21 tablet 0  . metFORMIN (GLUCOPHAGE) 500 MG tablet Take 1 tablet (500 mg total) by mouth 2 (two) times daily with a meal. 180 tablet 3  . ONETOUCH DELICA LANCETS 99991111 MISC Test sugars twice daily 50 each 3  . terbinafine (LAMISIL) 250 MG tablet Take 1 tablet (250 mg total) by mouth daily. 90 tablet 1  . omeprazole (PRILOSEC) 20 MG capsule Take 1 capsule by mouth daily. Reported on 03/29/2015    . pantoprazole (PROTONIX) 40 MG tablet Take 1 tablet (40 mg total) by mouth daily. 30 tablet 11   No facility-administered medications prior to visit.     Review of Systems   Patient denies headache, fevers, malaise, unintentional weight loss, skin rash, eye pain, sinus congestion and sinus pain, sore throat, dysphagia,  hemoptysis , cough, dyspnea, wheezing, chest pain, palpitations, orthopnea, edema, abdominal pain, nausea, melena, diarrhea, constipation, flank pain, dysuria, hematuria, urinary  Frequency, nocturia, numbness, tingling, seizures,  Focal weakness, Loss of consciousness,  Tremor, insomnia, depression, anxiety, and suicidal ideation.      Objective:  BP 130/78   Pulse 96   Temp 98.5 F (36.9 C) (Oral)   Ht 6' 0.5" (1.842 m)   Wt 206 lb 4 oz (93.6 kg)   SpO2 97%   BMI 27.59 kg/m   Physical Exam   General appearance: alert, cooperative and appears stated age Ears: normal TM's and external ear canals both ears Throat: lips, mucosa, and tongue normal; teeth and gums normal Neck: no adenopathy, no carotid bruit, supple, symmetrical, trachea midline and  thyroid not enlarged, symmetric, no tenderness/mass/nodules Back: symmetric, no curvature. ROM normal. No CVA tenderness. Lungs: clear to auscultation bilaterally Heart: regular rate and rhythm, S1, S2 normal, no murmur, click, rub or gallop Abdomen: soft, non-tender; bowel sounds normal; no masses,  no organomegaly Pulses: 2+ and symmetric Skin: Skin color, texture, turgor normal. No rashes or lesions MSK: negative straight leg lift.  Lymph nodes: Cervical, supraclavicular, and axillary nodes normal.    Assessment & Plan:   Problem List Items Addressed This Visit    Diabetes mellitus without complication ([place])    Currently well-controlled on current medications .  hemoglobin A1c is at goal of less than 7.0 . Patient is reminded to schedule an annual eye exam and foot exam is normal today. Patient has no microalbuminuria. Patient is tolerating statin therapy for CAD risk reduction and on ACE/ARB for renal protection and hypertension   Lab Results  Component Value Date   HGBA1C 6.6 (H) 10/14/2015   Lab Results  Component Value Date   MICROALBUR 3.2 (H) 10/14/2015   Lab Results  Component Value Date   CHOL 234 (H) 10/14/2015   HDL 25.70 (L)  10/14/2015   LDLCALC 115 (H) 03/23/2013   LDLDIRECT 162.0 10/14/2015   TRIG 362.0 (H) 10/14/2015   CHOLHDL 9 10/14/2015          Relevant Orders   Hemoglobin A1c (Completed)   Lipid panel (Completed)   Microalbumin / creatinine urine ratio (Completed)   Comprehensive metabolic panel (Completed)   Onychomycosis    Home remedies advised       Encounter for preventive health examination - Primary    Annual comprehensive preventive exam was done as well as an evaluation and management of acute and chronic conditions .  During the course of the visit the patient was educated and counseled about appropriate screening and preventive services including :  diabetes screening, lipid analysis with projected  10 year  risk for CAD , nutrition  counseling, prostate and colorectal cancer screening, and recommended immunizations.  Printed recommendations for health maintenance screenings was given.       Neuropathy ([place])    Ruling out reversible causes of neuropathy       Relevant Orders   Vitamin B12 (Completed)   RPR (Completed)   Rpr titer (Completed)   Essential hypertension    Well controlled on current regimen. Renal function stable, no changes today.  Lab Results  Component Value Date   CREATININE 0.96 10/14/2015   Lab Results  Component Value Date   NA 140 10/14/2015   K 3.8 10/14/2015   CL 106 10/14/2015   CO2 27 10/14/2015         Hypertriglyceridemia    Triglycerides  Were elevated at last check  over 300.  Recommended a low glycemic index diet, weight  loss and regular exercise. Repeat panel is pending  Lab Results  Component Value Date   CHOL 223* 02/21/2014   HDL 52.20 02/21/2014   LDLDIRECT 109.0 02/21/2014   TRIG 343.0* 02/21/2014   CHOLHDL 4 02/21/2014   Lab Results  Component Value Date   ALT 20 02/21/2014   AST 24 02/21/2014   ALKPHOS 73 02/21/2014   BILITOT 0.4 02/21/2014           Relevant Orders   Lipid panel (Completed)   LDL cholesterol, direct (Completed)   Chronic pain of right hip    He has a negative straight leg lift and no edema.  Plain films ordered to evaluation joint        Other Visit Diagnoses    Prostate cancer screening       Relevant Orders   PSA (Completed)   Encounter for immunization       Relevant Orders   Flu Vaccine QUAD 36+ mos IM (Completed)   Need for prophylactic vaccination against Streptococcus pneumoniae (pneumococcus)       Relevant Orders   Pneumococcal polysaccharide vaccine 23-valent greater than or equal to 2yo subcutaneous/IM (Completed)    .  I have discontinued Mr. Dubow omeprazole and pantoprazole. I am also having him maintain his aspirin, buPROPion, ONETOUCH DELICA LANCETS 99991111, glucose blood, cyanocobalamin, ibuprofen,  clomiPHENE, metFORMIN, EQ NICOTINE, terbinafine, and amLODipine.  No orders of the defined types were placed in this encounter.   Medications Discontinued During This Encounter  Medication Reason  . pantoprazole (PROTONIX) 40 MG tablet   . omeprazole (PRILOSEC) 20 MG capsule     Follow-up: No Follow-up on file.   [name], MD

## 2015-10-14 NOTE — Telephone Encounter (Signed)
Right hip  X rays ordered.

## 2015-10-14 NOTE — Patient Instructions (Addendum)
You may have some bursitis or arthritis in the hip,  WE will x ray the right hip  At [facility] on Thursday   You can take up to 1000 mg of plain tylenol every evening to manage th hip pain  you are having   This will not upset your stomach.  Is is not an anti inflammatory

## 2015-10-14 NOTE — Telephone Encounter (Signed)
Pt needs an order for xray of rt hip at [facility] for Thursday. Thank you!

## 2015-10-15 DIAGNOSIS — M25551 Pain in right hip: Secondary | ICD-10-CM

## 2015-10-15 DIAGNOSIS — G8929 Other chronic pain: Secondary | ICD-10-CM | POA: Insufficient documentation

## 2015-10-15 LAB — MICROALBUMIN / CREATININE URINE RATIO
Creatinine,U: 190.5 mg/dL
Microalb Creat Ratio: 1.7 mg/g (ref 0.0–30.0)
Microalb, Ur: 3.2 mg/dL — ABNORMAL HIGH (ref 0.0–1.9)

## 2015-10-15 LAB — LIPID PANEL
CHOL/HDL RATIO: 9
Cholesterol: 234 mg/dL — ABNORMAL HIGH (ref 0–200)
HDL: 25.7 mg/dL — AB (ref >39.00)
NONHDL: 208.35
Triglycerides: 362 mg/dL — ABNORMAL HIGH (ref 0.0–149.0)
VLDL: 72.4 mg/dL — ABNORMAL HIGH (ref 0.0–40.0)

## 2015-10-15 LAB — COMPREHENSIVE METABOLIC PANEL
ALT: 35 U/L (ref 0–53)
AST: 13 U/L (ref 0–37)
Albumin: 4.6 g/dL (ref 3.5–5.2)
Alkaline Phosphatase: 110 U/L (ref 39–117)
BUN: 10 mg/dL (ref 6–23)
CO2: 27 mEq/L (ref 19–32)
Calcium: 9.5 mg/dL (ref 8.4–10.5)
Chloride: 106 mEq/L (ref 96–112)
Creatinine, Ser: 0.96 mg/dL (ref 0.40–1.50)
GFR: 107.75 mL/min (ref >60.00)
Glucose, Bld: 160 mg/dL — ABNORMAL HIGH (ref 70–99)
Potassium: 3.8 mEq/L (ref 3.5–5.1)
Sodium: 140 mEq/L (ref 135–145)
Total Bilirubin: 0.5 mg/dL (ref 0.2–1.2)
Total Protein: 6.9 g/dL (ref 6.0–8.3)

## 2015-10-15 LAB — LDL CHOLESTEROL, DIRECT: LDL DIRECT: 162 mg/dL

## 2015-10-15 LAB — PSA: PSA: 0.8 ng/mL (ref 0.10–4.00)

## 2015-10-15 LAB — FLUORESCENT TREPONEMAL AB(FTA)-IGG-BLD: Fluorescent Treponemal ABS: REACTIVE — AB

## 2015-10-15 LAB — VITAMIN B12

## 2015-10-15 LAB — HEMOGLOBIN A1C: Hgb A1c MFr Bld: 6.6 % — ABNORMAL HIGH (ref 4.6–6.5)

## 2015-10-15 LAB — RPR: RPR Ser Ql: REACTIVE — AB

## 2015-10-15 LAB — RPR TITER

## 2015-10-15 NOTE — Assessment & Plan Note (Signed)
Home remedies advised

## 2015-10-15 NOTE — Assessment & Plan Note (Signed)

## 2015-10-15 NOTE — Assessment & Plan Note (Signed)
Well controlled on current regimen. Renal function stable, no changes today.  Lab Results  Component Value Date   CREATININE 0.96 10/14/2015   Lab Results  Component Value Date   NA 140 10/14/2015   K 3.8 10/14/2015   CL 106 10/14/2015   CO2 27 10/14/2015

## 2015-10-15 NOTE — Assessment & Plan Note (Signed)
He has a negative straight leg lift and no edema.  Plain films ordered to evaluation joint

## 2015-10-15 NOTE — Assessment & Plan Note (Signed)
Triglycerides  Were elevated at last check  over 300.  Recommended a low glycemic index diet, weight  loss and regular exercise. Repeat panel is pending  Lab Results  Component Value Date   CHOL 223* 02/21/2014   HDL 52.20 02/21/2014   LDLDIRECT 109.0 02/21/2014   TRIG 343.0* 02/21/2014   CHOLHDL 4 02/21/2014   Lab Results  Component Value Date   ALT 20 02/21/2014   AST 24 02/21/2014   ALKPHOS 73 02/21/2014   BILITOT 0.4 02/21/2014

## 2015-10-15 NOTE — Assessment & Plan Note (Signed)
Currently well-controlled on current medications .  hemoglobin A1c is at goal of less than 7.0 . Patient is reminded to schedule an annual eye exam and foot exam is normal today. Patient has no microalbuminuria. Patient is tolerating statin therapy for CAD risk reduction and on ACE/ARB for renal protection and hypertension   Lab Results  Component Value Date   HGBA1C 6.6 (H) 10/14/2015   Lab Results  Component Value Date   MICROALBUR 3.2 (H) 10/14/2015   Lab Results  Component Value Date   CHOL 234 (H) 10/14/2015   HDL 25.70 (L) 10/14/2015   LDLCALC 115 (H) 03/23/2013   LDLDIRECT 162.0 10/14/2015   TRIG 362.0 (H) 10/14/2015   CHOLHDL 9 10/14/2015

## 2015-10-15 NOTE — Assessment & Plan Note (Signed)
Ruling out reversible causes of neuropathy

## 2015-10-16 ENCOUNTER — Encounter: Payer: Self-pay | Admitting: Endocrinology

## 2015-10-16 ENCOUNTER — Ambulatory Visit (INDEPENDENT_AMBULATORY_CARE_PROVIDER_SITE_OTHER): Payer: BLUE CROSS/BLUE SHIELD | Admitting: Endocrinology

## 2015-10-16 ENCOUNTER — Telehealth: Payer: Self-pay | Admitting: *Deleted

## 2015-10-16 VITALS — BP 128/84 | HR 97 | Ht 73.0 in | Wt 202.0 lb

## 2015-10-16 DIAGNOSIS — Z8619 Personal history of other infectious and parasitic diseases: Secondary | ICD-10-CM

## 2015-10-16 DIAGNOSIS — E291 Testicular hypofunction: Secondary | ICD-10-CM | POA: Diagnosis not present

## 2015-10-16 DIAGNOSIS — R7989 Other specified abnormal findings of blood chemistry: Secondary | ICD-10-CM

## 2015-10-16 NOTE — Patient Instructions (Addendum)
blood tests are requested for you today.  We'll let you know about the results.  Based on the results, you probably need to resume the clomiphene.   Testosterone treatment has risks, including increased or decreased fertility (depending on the type of treatment), hair loss, prostate cancer, benign prostate enlargement, blood clots, liver problems, lower hdl ("good cholesterol"), polycythemia (opposite of anemia), sleep apnea, and behavior changes.  Please come back for a follow-up appointment in 1 year.

## 2015-10-16 NOTE — Progress Notes (Signed)
Subjective:    Patient ID: Jacob Baker, male    DOB: 09/16/1970, 45 y.o.   MRN: [id number]  HPI Pt returns for idiopathic central hypogonadism (dx'ed 2016; he has 4 biological children; pituitary MRI was normal; he was rx'ed clomid).  Pt says he has abandoned their efforts towards another pregnancy.  He ran out of the clomid a few mos ago.   Past Medical History:  Diagnosis Date  . CHF (congestive heart failure) ([place])   . Diabetes mellitus without complication ([place])   . GERD (gastroesophageal reflux disease)   . History of migraine    none in over 2 yrs  . Hyperlipidemia   . Hypertension   . Syncope and collapse    x1 - approx 2-3 yrs ago - dehydration    Past Surgical History:  Procedure Laterality Date  . COLONOSCOPY    . ESOPHAGOGASTRODUODENOSCOPY (EGD) WITH PROPOFOL N/A 04/08/2015   Procedure: ESOPHAGOGASTRODUODENOSCOPY (EGD) WITH PROPOFOL with dialation;  Surgeon: Lucilla Lame, MD;  Location: [facility];  Service: Endoscopy;  Laterality: N/A;  diabetic - oral meds  . FINGER AMPUTATION  2009   partial removal left index finger     Social History   Social History  . Marital status: Married    Spouse name: N/A  . Number of children: N/A  . Years of education: N/A   Occupational History  . Not on file.   Social History Main Topics  . Smoking status: Former Smoker    Packs/day: 2.00    Years: 27.00    Types: Cigarettes    Quit date: 02/27/2015  . Smokeless tobacco: Never Used  . Alcohol use No  . Drug use: No  . Sexual activity: Not on file   Other Topics Concern  . Not on file   Social History Narrative  . No narrative on file    Current Outpatient Prescriptions on File Prior to Visit  Medication Sig Dispense Refill  . amLODipine (NORVASC) 10 MG tablet Take 1 tablet (10 mg total) by mouth daily. 30 tablet 5  . aspirin 81 MG tablet Take 81 mg by mouth daily.    Marland Kitchen buPROPion (WELLBUTRIN SR) 150 MG 12 hr tablet Take 200 mg by mouth 2 (two) times  daily.    . clomiPHENE (CLOMID) 50 MG tablet 1/4 tab daily 15 tablet 3  . cyanocobalamin 1000 MCG tablet Take 100 mcg by mouth daily.    Noelle Penner NICOTINE 14 MG/24HR patch APPLY ONE PATCH TOPICALLY ONCE DAILY (REPLACES PREVIOUSLY SENT 21 MCG DOSE) 28 patch 0  . glucose blood (ONETOUCH VERIO) test strip Test sugars twice daily 50 each 6  . ibuprofen (ADVIL,MOTRIN) 800 MG tablet Take 1 tablet (800 mg total) by mouth 3 (three) times daily. 21 tablet 0  . metFORMIN (GLUCOPHAGE) 500 MG tablet Take 1 tablet (500 mg total) by mouth 2 (two) times daily with a meal. 180 tablet 3  . ONETOUCH DELICA LANCETS 99991111 MISC Test sugars twice daily 50 each 3  . terbinafine (LAMISIL) 250 MG tablet Take 1 tablet (250 mg total) by mouth daily. 90 tablet 1   No current facility-administered medications on file prior to visit.     No Known Allergies  Family History  Problem Relation Age of Onset  . Heart disease Father     CAD  . Hypertension Father   . Diabetes Father   . Hypertension Sister   . Hypertension Brother   . Hypertension Brother  BP 128/84   Pulse 97   Ht 6\' 1"  (1.854 m)   Wt 202 lb (91.6 kg)   SpO2 92%   BMI 26.65 kg/m    Review of Systems Denies decreased urinary stream.      Objective:   Physical Exam VITAL SIGNS:  See vs page.  GENERAL: no distress.  GENITALIA: Normal male testicles, scrotum, and penis.     Lab Results  Component Value Date   PSA 0.80 10/14/2015    Lab Results  Component Value Date   TESTOSTERONE 224 (L) 10/16/2015      Assessment & Plan:  Hypogonadism: worse off rx.  I have sent a prescription to your pharmacy, to resume

## 2015-10-16 NOTE — Telephone Encounter (Signed)
Meteea requested a call in regards to lab result for pt.  Contact [phone] or [phone]-have her over head paged

## 2015-10-16 NOTE — Telephone Encounter (Signed)
Left message stating that labs have not been interpreted by Dr. Derrel Nip.

## 2015-10-17 NOTE — Telephone Encounter (Signed)
Patients who complete treatment for syphilis should not have a positive RPR test 20 years later. His titer is very low, so I'm not sure this result represents a new infection or just evidence of an old treated infection.  I ran the test because of his neuropathy (numbness) .  I would like him to see one of the Infectious Disease specialists at [facility] to help sort this out.  I will make the referral Please let Health Dept and patient know that this is my plan.

## 2015-10-17 NOTE — Telephone Encounter (Signed)
Health department stated that patient was DX with Syphilis back in 1998. Patient was also treated with Bicillin twice.

## 2015-10-17 NOTE — Telephone Encounter (Signed)
Meteea called back returning your call. Thank you!

## 2015-10-18 LAB — TESTOSTERONE,FREE AND TOTAL
Testosterone, Free: 2.9 pg/mL — ABNORMAL LOW (ref 6.8–21.5)
Testosterone: 224 ng/dL — ABNORMAL LOW (ref 264–916)

## 2015-10-18 MED ORDER — CLOMIPHENE CITRATE 50 MG PO TABS: ORAL_TABLET | ORAL | 5 refills | Status: DC

## 2015-10-18 NOTE — Telephone Encounter (Signed)
Patient has been notified

## 2015-10-18 NOTE — Telephone Encounter (Signed)
Left detailed message on Meteeas voicmail.Marland Kitchen

## 2015-10-18 NOTE — Telephone Encounter (Signed)
Left message for patient to return call back.  

## 2015-10-24 ENCOUNTER — Telehealth: Payer: Self-pay | Admitting: Internal Medicine

## 2015-10-24 NOTE — Telephone Encounter (Signed)
I spoke with Dr Johnnye Sima ,  The infectious disease specialist.  He has reviewed Mr Jacob Baker recent test for syphilis and states that there is no evidence of ongoing or recurrent infection, so He does not need to see him.  The tests indicate PAST TREATED infection

## 2015-10-25 NOTE — Telephone Encounter (Signed)
Patient has been informed.

## 2015-10-25 NOTE — Telephone Encounter (Signed)
Left message to call office

## 2015-10-25 NOTE — Telephone Encounter (Signed)
Please call pt at [phone]

## 2015-11-02 DIAGNOSIS — E119 Type 2 diabetes mellitus without complications: Secondary | ICD-10-CM | POA: Diagnosis not present

## 2015-11-02 DIAGNOSIS — H40033 Anatomical narrow angle, bilateral: Secondary | ICD-10-CM | POA: Diagnosis not present

## 2015-11-14 LAB — HM DIABETES EYE EXAM

## 2015-12-26 DIAGNOSIS — L609 Nail disorder, unspecified: Secondary | ICD-10-CM | POA: Diagnosis not present

## 2015-12-26 DIAGNOSIS — E1151 Type 2 diabetes mellitus with diabetic peripheral angiopathy without gangrene: Secondary | ICD-10-CM | POA: Diagnosis not present

## 2015-12-26 DIAGNOSIS — E1140 Type 2 diabetes mellitus with diabetic neuropathy, unspecified: Secondary | ICD-10-CM | POA: Diagnosis not present

## 2015-12-26 DIAGNOSIS — B351 Tinea unguium: Secondary | ICD-10-CM | POA: Diagnosis not present

## 2016-01-07 ENCOUNTER — Other Ambulatory Visit: Payer: Self-pay | Admitting: Endocrinology

## 2016-02-14 DIAGNOSIS — H5213 Myopia, bilateral: Secondary | ICD-10-CM | POA: Diagnosis not present

## 2016-03-16 DIAGNOSIS — F439 Reaction to severe stress, unspecified: Secondary | ICD-10-CM | POA: Diagnosis not present

## 2016-03-24 DIAGNOSIS — F439 Reaction to severe stress, unspecified: Secondary | ICD-10-CM | POA: Diagnosis not present

## 2016-04-16 DIAGNOSIS — M79671 Pain in right foot: Secondary | ICD-10-CM | POA: Diagnosis not present

## 2016-04-16 DIAGNOSIS — B351 Tinea unguium: Secondary | ICD-10-CM | POA: Diagnosis not present

## 2016-04-16 DIAGNOSIS — M79674 Pain in right toe(s): Secondary | ICD-10-CM | POA: Diagnosis not present

## 2016-04-16 DIAGNOSIS — M79672 Pain in left foot: Secondary | ICD-10-CM | POA: Diagnosis not present

## 2016-05-15 ENCOUNTER — Telehealth: Payer: Self-pay | Admitting: Internal Medicine

## 2016-05-15 MED ORDER — AMLODIPINE BESYLATE 10 MG PO TABS: 10.0000 mg | ORAL_TABLET | Freq: Every day | ORAL | 0 refills | Status: DC

## 2016-05-15 NOTE — Telephone Encounter (Signed)
Pt will need a refill on his medication.

## 2016-05-15 NOTE — Telephone Encounter (Signed)
1130 that day if he is good with it I will add, thanks

## 2016-05-15 NOTE — Telephone Encounter (Signed)
Did he agree to the appt?

## 2016-05-15 NOTE — Telephone Encounter (Signed)
Lm on vm to call office. Dr. Derrel Nip needs to see him in order to fill amLODipine (NORVASC) 10 MG tablet, for a 90 day suppy. If he is out will fill for 30 days. Pt requested a 06/11/16 appt. Lavella Lemons said to put him in at 11:30 that day. If pt can come that day let Tanya know and she will schedule appt.

## 2016-05-15 NOTE — Telephone Encounter (Signed)
Message   Appointment Request From: Jacob Baker    With Provider: [name], MD [facility] [place]]    Preferred Date Range: From 06/11/2016 To 06/11/2016    Preferred Times: Any    Reason: To address the following health maintenance concerns.  Foot Exam  Ophthalmology Exam  Hemoglobin A1c    Comments:  I would like to ask Dr. Derrel Nip can she increase my Amlodipine to a 90 day supply. I am switching to [facility] in [place], [place], and I need a longer supply. I would also like to see her on 06/11/2016 for the above requested appointments. Thank you.    Jacob Baker  29-Jun-1970

## 2016-05-15 NOTE — Telephone Encounter (Signed)
rx sent, thanks.

## 2016-06-11 ENCOUNTER — Encounter: Payer: Self-pay | Admitting: Internal Medicine

## 2016-06-11 ENCOUNTER — Ambulatory Visit (INDEPENDENT_AMBULATORY_CARE_PROVIDER_SITE_OTHER): Payer: BLUE CROSS/BLUE SHIELD | Admitting: Internal Medicine

## 2016-06-11 ENCOUNTER — Encounter: Payer: Self-pay | Admitting: General Surgery

## 2016-06-11 VITALS — BP 130/94 | HR 79 | Temp 98.3°F | Resp 16 | Ht 73.0 in | Wt 203.4 lb

## 2016-06-11 DIAGNOSIS — E781 Pure hyperglyceridemia: Secondary | ICD-10-CM

## 2016-06-11 DIAGNOSIS — R7989 Other specified abnormal findings of blood chemistry: Secondary | ICD-10-CM

## 2016-06-11 DIAGNOSIS — L720 Epidermal cyst: Secondary | ICD-10-CM | POA: Diagnosis not present

## 2016-06-11 DIAGNOSIS — I10 Essential (primary) hypertension: Secondary | ICD-10-CM | POA: Diagnosis not present

## 2016-06-11 DIAGNOSIS — Z720 Tobacco use: Secondary | ICD-10-CM | POA: Diagnosis not present

## 2016-06-11 DIAGNOSIS — E119 Type 2 diabetes mellitus without complications: Secondary | ICD-10-CM | POA: Diagnosis not present

## 2016-06-11 DIAGNOSIS — R5383 Other fatigue: Secondary | ICD-10-CM | POA: Diagnosis not present

## 2016-06-11 LAB — LIPID PANEL
Cholesterol: 209 mg/dL — ABNORMAL HIGH (ref 0–200)
HDL: 24.9 mg/dL — AB (ref >39.00)
NonHDL: 183.81
Total CHOL/HDL Ratio: 8
Triglycerides: 250 mg/dL — ABNORMAL HIGH (ref 0.0–149.0)
VLDL: 50 mg/dL — ABNORMAL HIGH (ref 0.0–40.0)

## 2016-06-11 LAB — COMPREHENSIVE METABOLIC PANEL
ALBUMIN: 4.7 g/dL (ref 3.5–5.2)
ALK PHOS: 78 U/L (ref 39–117)
ALT: 31 U/L (ref 0–53)
AST: 17 U/L (ref 0–37)
BILIRUBIN TOTAL: 0.7 mg/dL (ref 0.2–1.2)
BUN: 9 mg/dL (ref 6–23)
CHLORIDE: 106 meq/L (ref 96–112)
CO2: 27 mEq/L (ref 19–32)
CREATININE: 1.04 mg/dL (ref 0.40–1.50)
Calcium: 9.3 mg/dL (ref 8.4–10.5)
GFR: 97.97 mL/min (ref >60.00)
GLUCOSE: 122 mg/dL — AB (ref 70–99)
Potassium: 3.7 mEq/L (ref 3.5–5.1)
SODIUM: 139 meq/L (ref 135–145)
Total Protein: 6.9 g/dL (ref 6.0–8.3)

## 2016-06-11 LAB — TSH: TSH: 1.07 u[IU]/mL (ref 0.35–4.50)

## 2016-06-11 LAB — HEMOGLOBIN A1C: Hgb A1c MFr Bld: 6.2 % (ref 4.6–6.5)

## 2016-06-11 LAB — LDL CHOLESTEROL, DIRECT: LDL DIRECT: 142 mg/dL

## 2016-06-11 MED ORDER — NICOTINE 21 MG/24HR TD PT24: 21.0000 mg | MEDICATED_PATCH | Freq: Every day | TRANSDERMAL | 0 refills | Status: DC

## 2016-06-11 NOTE — Progress Notes (Signed)
Subjective:  Patient ID: Jacob Baker, male    DOB: 1970-10-10  Age: 46 y.o. MRN: [id number]  CC: The primary encounter diagnosis was Low testosterone. Diagnoses of Hypertriglyceridemia, Essential hypertension, Diabetes mellitus without complication ([place]), Epidermal cyst, Epidermal inclusion cyst, Fatigue, unspecified type, and Tobacco abuse disorder were also pertinent to this visit.  HPI Jacob Baker presents for  6  month follow up on diabetes, hypertension , GERD and tobacco abuse. .  Patient has several  complaints today.  Patient is following a low glycemic index diet and taking all prescribed medications regularly without side effects.  Fasting sugars have not been checked lately ,  post prandials have been under 160 except on rare occasions. Patient is exercising about 3 times per week and intentionally trying to lose weight .  Patient has had an eye exam in the last 12 months , Oct 2017 and checks feet regularly for signs of infection.  Patient does not walk barefoot outside Sees podiatry every 3-4 months for  management of dystrophic toenails  . ,  And denies an numbness tingling or burning in feet. Patient is up to date on all recommended vaccinations  Cc: 1) Fatigue, no libido.  Takes clomid for low testoterone, has not had dose adjusted in over a year. ,  Doesn't feel it is working.   2) Epidermal cyst over his spine in the upper thoracic portion of back,    Periodically becomes enlarged and enflamed.  Wants it removed. Discussed gen surg referral  3) Hypertension:  Tolerating  amlodipine.  Checks bp at home ,  Elevated readings.   did not take meds this morning   Foot exam normal  Today  Declines HIV screening   Lab Results  Component Value Date   TESTOSTERONE 224 (L) 10/16/2015    Outpatient Medications Prior to Visit  Medication Sig Dispense Refill  . amLODipine (NORVASC) 10 MG tablet Take 1 tablet (10 mg total) by mouth daily. 30 tablet 0  . aspirin 81 MG tablet  Take 81 mg by mouth daily.    Marland Kitchen buPROPion (WELLBUTRIN SR) 150 MG 12 hr tablet Take 200 mg by mouth 2 (two) times daily.    . clomiPHENE (CLOMID) 50 MG tablet 1/4 tab daily 15 tablet 5  . cyanocobalamin 1000 MCG tablet Take 100 mcg by mouth daily.    Marland Kitchen glucose blood (ONETOUCH VERIO) test strip Test sugars twice daily 50 each 6  . ibuprofen (ADVIL,MOTRIN) 800 MG tablet Take 1 tablet (800 mg total) by mouth 3 (three) times daily. 21 tablet 0  . metFORMIN (GLUCOPHAGE) 500 MG tablet TAKE ONE TABLET BY MOUTH TWICE DAILY WITH A MEAL 180 tablet 3  . ONETOUCH DELICA LANCETS 33A MISC Test sugars twice daily 50 each 3  . EQ NICOTINE 14 MG/24HR patch APPLY ONE PATCH TOPICALLY ONCE DAILY (REPLACES PREVIOUSLY SENT 21 MCG DOSE) (Patient not taking: Reported on 06/11/2016) 28 patch 0  . terbinafine (LAMISIL) 250 MG tablet Take 1 tablet (250 mg total) by mouth daily. (Patient not taking: Reported on 06/11/2016) 90 tablet 1   No facility-administered medications prior to visit.     Review of Systems;  Patient denies headache, fevers, malaise, unintentional weight loss, skin rash, eye pain, sinus congestion and sinus pain, sore throat, dysphagia,  hemoptysis , cough, dyspnea, wheezing, chest pain, palpitations, orthopnea, edema, abdominal pain, nausea, melena, diarrhea, constipation, flank pain, dysuria, hematuria, urinary  Frequency, nocturia, numbness, tingling, seizures,  Focal weakness, Loss of consciousness,  Tremor, insomnia, depression, anxiety, and suicidal ideation.      Objective:  BP (!) 130/94 (BP Location: Left Arm, Patient Position: Sitting, Cuff Size: Large)   Pulse 79   Temp 98.3 F (36.8 C) (Oral)   Resp 16   Ht 6\' 1"  (1.854 m)   Wt 203 lb 6.4 oz (92.3 kg)   SpO2 97%   BMI 26.84 kg/m   BP Readings from Last 3 Encounters:  06/11/16 (!) 130/94  10/16/15 128/84  10/14/15 130/78    Wt Readings from Last 3 Encounters:  06/11/16 203 lb 6.4 oz (92.3 kg)  10/16/15 202 lb (91.6 kg)    10/14/15 206 lb 4 oz (93.6 kg)    General appearance: alert, cooperative and appears stated age Ears: normal TM's and external ear canals both ears Throat: lips, mucosa, and tongue normal; teeth and gums normal Neck: no adenopathy, no carotid bruit, supple, symmetrical, trachea midline and thyroid not enlarged, symmetric, no tenderness/mass/nodules Back: symmetric, no curvature. ROM normal. No CVA tenderness. Lungs: clear to auscultation bilaterally Heart: regular rate and rhythm, S1, S2 normal, no murmur, click, rub or gallop Abdomen: soft, non-tender; bowel sounds normal; no masses,  no organomegaly Pulses: 2+ and symmetric Skin: Skin color, texture, turgor normal. No rashes or lesions Lymph nodes: Cervical, supraclavicular, and axillary nodes normal.  Lab Results  Component Value Date   HGBA1C 6.2 06/11/2016   HGBA1C 6.6 (H) 10/14/2015   HGBA1C 6.0 06/26/2015    Lab Results  Component Value Date   CREATININE 1.04 06/11/2016   CREATININE 0.96 10/14/2015   CREATININE 1.04 06/26/2015    Lab Results  Component Value Date   WBC 5.5 06/26/2015   HGB 12.9 (L) 06/26/2015   HCT 38.2 (L) 06/26/2015   PLT 248.0 06/26/2015   GLUCOSE 122 (H) 06/11/2016   CHOL 209 (H) 06/11/2016   TRIG 250.0 (H) 06/11/2016   HDL 24.90 (L) 06/11/2016   LDLDIRECT 142.0 06/11/2016   LDLCALC 115 (H) 03/23/2013   ALT 31 06/11/2016   AST 17 06/11/2016   NA 139 06/11/2016   K 3.7 06/11/2016   CL 106 06/11/2016   CREATININE 1.04 06/11/2016   BUN 9 06/11/2016   CO2 27 06/11/2016   TSH 1.07 06/11/2016   PSA 0.80 10/14/2015   INR 0.96 10/13/2011   HGBA1C 6.2 06/11/2016   MICROALBUR 3.2 (H) 10/14/2015    No results found.  Assessment & Plan:   Problem List Items Addressed This Visit    Tobacco abuse disorder    After quitting in 2017,  He has resumed smoking and requesting help in quitting  Nicoderm patches prescribed.       Low testosterone - Primary    Secondary to hypopit,  Managed by  Loanne Drilling.  Rechecking level today on Clomid.  Has Endocrine follow up in August         Relevant Orders   Testos,Total,Free and SHBG (Male)   TSH (Completed)   Hypertriglyceridemia    Using the Framingham risk calculator,  his 10 year risk of coronary artery disease is 32%..  Will recommend trial of crestor and ASA given elevated triglycerides and concurrent type 2 diabetes .    Lab Results  Component Value Date   CHOL 209 (H) 06/11/2016   HDL 24.90 (L) 06/11/2016   LDLCALC 115 (H) 03/23/2013   LDLDIRECT 142.0 06/11/2016   TRIG 250.0 (H) 06/11/2016   CHOLHDL 8 06/11/2016         Relevant Orders   Lipid  panel (Completed)   LDL cholesterol, direct (Completed)   Fatigue    Screening labs normal.   history of snoring.  May be low testosterone vs untreated OSA.  Asked to consider sleep study .  Lab Results  Component Value Date   TSH 1.07 06/11/2016   Lab Results  Component Value Date   CREATININE 1.04 06/11/2016   Lab Results  Component Value Date   WBC 5.5 06/26/2015   HGB 12.9 (L) 06/26/2015   HCT 38.2 (L) 06/26/2015   MCV 83.3 06/26/2015   PLT 248.0 06/26/2015         Essential hypertension   Relevant Orders   Comprehensive metabolic panel (Completed)   Epidermal inclusion cyst    Vs sebaceous cyst of upper back. Becomes periodically enflamed,  Wants it removed.Marland Kitchen  Referral to gen surgery       Diabetes mellitus without complication ([place])    Currently well-controlled on metformin alone .  hemoglobin A1c is at goal of less than 7.0 . Patient is up to date on  an annual eye exam and foot exam is normal today except for dystrophic nails.. Patient has minimal  microalbuminuria. Patient iwill be advised to start statin therapy for CAD risk reduction now that his use of terbinafine for onychomycosis has come to an end .  We will  need to change amlodipine to losartan for renal protection and hypertension   Lab Results  Component Value Date   HGBA1C 6.2 06/11/2016    Lab Results  Component Value Date   MICROALBUR 3.2 (H) 10/14/2015   Lab Results  Component Value Date   CHOL 209 (H) 06/11/2016   HDL 24.90 (L) 06/11/2016   LDLCALC 115 (H) 03/23/2013   LDLDIRECT 142.0 06/11/2016   TRIG 250.0 (H) 06/11/2016   CHOLHDL 8 06/11/2016          Relevant Orders   Hemoglobin A1c (Completed)    Other Visit Diagnoses    Epidermal cyst       Relevant Orders   Ambulatory referral to General Surgery     A total of 40 minutes was spent with patient more than half of which was spent in counseling patient on the above mentioned issues , reviewing and explaining recent labs and imaging studies done, and coordination of care.  I have discontinued Mr. [place] terbinafine. I am also having him start on nicotine. Additionally, I am having him maintain his aspirin, buPROPion, ONETOUCH DELICA LANCETS 35T, glucose blood, cyanocobalamin, ibuprofen, EQ NICOTINE, clomiPHENE, metFORMIN, amLODipine, and ciclopirox.  Meds ordered this encounter  Medications  . ciclopirox (PENLAC) 8 % solution    Sig: Apply topically.  . nicotine (NICODERM CQ - DOSED IN MG/24 HOURS) 21 mg/24hr patch    Sig: Place 1 patch (21 mg total) onto the skin daily.    Dispense:  28 patch    Refill:  0    Medications Discontinued During This Encounter  Medication Reason  . terbinafine (LAMISIL) 250 MG tablet Patient has not taken in last 30 days    Follow-up: No Follow-up on file.   [name], MD

## 2016-06-11 NOTE — Patient Instructions (Addendum)
  The new goals for optimal blood pressure management are 120/70.  Please check your blood pressure a few times at home and send me the readings so I can determince if you need a change in medication     I am checking your thyroid function and your testosterone level todAY   Your fatigue may be coming from sleep apnea,  Since you snore.  Ask your wife if you "stop breathing" at night (long pauses between breaths)

## 2016-06-13 ENCOUNTER — Encounter: Payer: Self-pay | Admitting: Internal Medicine

## 2016-06-13 DIAGNOSIS — R5383 Other fatigue: Secondary | ICD-10-CM | POA: Insufficient documentation

## 2016-06-13 DIAGNOSIS — L720 Epidermal cyst: Secondary | ICD-10-CM | POA: Insufficient documentation

## 2016-06-13 NOTE — Assessment & Plan Note (Addendum)
Secondary to hypopit,  Managed by Loanne Drilling.  Rechecking level today on Clomid.  Has Endocrine follow up in August

## 2016-06-13 NOTE — Assessment & Plan Note (Signed)
Screening labs normal.   history of snoring.  May be low testosterone vs untreated OSA.  Asked to consider sleep study .  Lab Results  Component Value Date   TSH 1.07 06/11/2016   Lab Results  Component Value Date   CREATININE 1.04 06/11/2016   Lab Results  Component Value Date   WBC 5.5 06/26/2015   HGB 12.9 (L) 06/26/2015   HCT 38.2 (L) 06/26/2015   MCV 83.3 06/26/2015   PLT 248.0 06/26/2015

## 2016-06-13 NOTE — Assessment & Plan Note (Signed)
Vs sebaceous cyst of upper back. Becomes periodically enflamed,  Wants it removed.Marland Kitchen  Referral to gen surgery

## 2016-06-13 NOTE — Assessment & Plan Note (Addendum)
Currently well-controlled on metformin alone .  hemoglobin A1c is at goal of less than 7.0 . Patient is up to date on  an annual eye exam and foot exam is normal today except for dystrophic nails.. Patient has minimal  microalbuminuria. Patient iwill be advised to start statin therapy for CAD risk reduction now that his use of terbinafine for onychomycosis has come to an end .  We will  need to change amlodipine to losartan for renal protection and hypertension   Lab Results  Component Value Date   HGBA1C 6.2 06/11/2016   Lab Results  Component Value Date   MICROALBUR 3.2 (H) 10/14/2015   Lab Results  Component Value Date   CHOL 209 (H) 06/11/2016   HDL 24.90 (L) 06/11/2016   LDLCALC 115 (H) 03/23/2013   LDLDIRECT 142.0 06/11/2016   TRIG 250.0 (H) 06/11/2016   CHOLHDL 8 06/11/2016

## 2016-06-13 NOTE — Assessment & Plan Note (Signed)
After quitting in 2017,  He has resumed smoking and requesting help in quitting  Nicoderm patches prescribed.

## 2016-06-13 NOTE — Assessment & Plan Note (Signed)
Using the Framingham risk calculator,  his 10 year risk of coronary artery disease is 32%..  Will recommend trial of crestor and ASA given elevated triglycerides and concurrent type 2 diabetes .    Lab Results  Component Value Date   CHOL 209 (H) 06/11/2016   HDL 24.90 (L) 06/11/2016   LDLCALC 115 (H) 03/23/2013   LDLDIRECT 142.0 06/11/2016   TRIG 250.0 (H) 06/11/2016   CHOLHDL 8 06/11/2016

## 2016-06-14 ENCOUNTER — Encounter: Payer: Self-pay | Admitting: Internal Medicine

## 2016-06-15 LAB — TESTOS,TOTAL,FREE AND SHBG (FEMALE)
Sex Hormone Binding Glob.: 49 nmol/L (ref 10–50)
TESTOSTERONE,TOTAL,LC/MS/MS: 477 ng/dL (ref 250–1100)
Testosterone, Free: 56.3 pg/mL (ref 35.0–155.0)

## 2016-06-18 ENCOUNTER — Encounter: Payer: Self-pay | Admitting: Internal Medicine

## 2016-06-29 ENCOUNTER — Other Ambulatory Visit: Payer: Self-pay | Admitting: Internal Medicine

## 2016-07-02 ENCOUNTER — Ambulatory Visit (INDEPENDENT_AMBULATORY_CARE_PROVIDER_SITE_OTHER): Payer: BLUE CROSS/BLUE SHIELD | Admitting: General Surgery

## 2016-07-02 ENCOUNTER — Encounter: Payer: Self-pay | Admitting: *Deleted

## 2016-07-02 VITALS — BP 128/74 | HR 100 | Resp 12 | Ht 73.0 in | Wt 203.0 lb

## 2016-07-02 DIAGNOSIS — L723 Sebaceous cyst: Secondary | ICD-10-CM | POA: Diagnosis not present

## 2016-07-02 DIAGNOSIS — L720 Epidermal cyst: Secondary | ICD-10-CM | POA: Diagnosis not present

## 2016-07-02 NOTE — Progress Notes (Signed)
Patient ID: Jacob Baker, male   DOB: 1970-09-16, 46 y.o.   MRN: [id number]  Chief Complaint  Patient presents with  . Mass    HPI Jacob Baker is a 46 y.o. male.  Here today for evaluation of a cyst on his back referred by  Dr Derrel Nip. He states it has been there 15 years. He states it drained about 3 months ago (wife popped it). He states it is a little tender to touch.   HPI  Past Medical History:  Diagnosis Date  . CHF (congestive heart failure) ([place])   . Diabetes mellitus without complication ([place])   . GERD (gastroesophageal reflux disease)   . History of migraine    none in over 2 yrs  . Hyperlipidemia   . Hypertension   . Syncope and collapse    x1 - approx 2-3 yrs ago - dehydration    Past Surgical History:  Procedure Laterality Date  . COLONOSCOPY    . ESOPHAGOGASTRODUODENOSCOPY (EGD) WITH PROPOFOL N/A 04/08/2015   Procedure: ESOPHAGOGASTRODUODENOSCOPY (EGD) WITH PROPOFOL with dialation;  Surgeon: Lucilla Lame, MD;  Location: [facility];  Service: Endoscopy;  Laterality: N/A;  diabetic - oral meds  . FINGER AMPUTATION  2009   partial removal left index finger     Family History  Problem Relation Age of Onset  . Heart disease Father        CAD  . Hypertension Father   . Diabetes Father   . Hypertension Sister   . Hypertension Brother   . Hypertension Brother   . Hypertension Mother     Social History Social History  Substance Use Topics  . Smoking status: Current Every Day Smoker    Packs/day: 2.00    Years: 27.00    Types: Cigarettes    Last attempt to quit: 02/27/2015  . Smokeless tobacco: Never Used  . Alcohol use No    No Known Allergies  Current Outpatient Prescriptions  Medication Sig Dispense Refill  . amLODipine (NORVASC) 10 MG tablet TAKE 1 TABLET(10 MG) BY MOUTH DAILY 30 tablet 0  . aspirin 81 MG tablet Take 81 mg by mouth daily.    Marland Kitchen buPROPion (WELLBUTRIN SR) 150 MG 12 hr tablet Take 200 mg by mouth 2 (two) times daily.    .  ciclopirox (PENLAC) 8 % solution Apply topically.    . clomiPHENE (CLOMID) 50 MG tablet 1/4 tab daily 15 tablet 5  . cyanocobalamin 1000 MCG tablet Take 100 mcg by mouth daily.    Noelle Penner NICOTINE 14 MG/24HR patch APPLY ONE PATCH TOPICALLY ONCE DAILY (REPLACES PREVIOUSLY SENT 21 MCG DOSE) 28 patch 0  . glucose blood (ONETOUCH VERIO) test strip Test sugars twice daily 50 each 6  . ibuprofen (ADVIL,MOTRIN) 800 MG tablet Take 1 tablet (800 mg total) by mouth 3 (three) times daily. 21 tablet 0  . metFORMIN (GLUCOPHAGE) 500 MG tablet TAKE ONE TABLET BY MOUTH TWICE DAILY WITH A MEAL 180 tablet 3  . nicotine (NICODERM CQ - DOSED IN MG/24 HOURS) 21 mg/24hr patch Place 1 patch (21 mg total) onto the skin daily. 28 patch 0  . ONETOUCH DELICA LANCETS 84X MISC Test sugars twice daily 50 each 3   No current facility-administered medications for this visit.     Review of Systems Review of Systems  Constitutional: Negative.   Respiratory: Negative.   Cardiovascular: Negative.     Blood pressure 128/74, pulse 100, resp. rate 12, height 6\' 1"  (1.854 m), weight 203  lb (92.1 kg).  Physical Exam Physical Exam  Constitutional: He is oriented to person, place, and time. He appears well-developed and well-nourished.  Neurological: He is alert and oriented to person, place, and time.  Skin: Skin is warm and dry.  1.5 cm cyst upper back  Psychiatric: His behavior is normal.       Assessment    Symptomatic sebaceous cyst of the back.    Plan    Excision was discussed and the was amenable to the patient. 10 mL of 0.5% Xylocaine with 0.25% Marcaine with 1-200,000 of epinephrine was utilized followed by 3 mL of 1% plain Xylocaine. ChloraPrep was applied to the skin. Through a vertically oriented elliptical incision the cyst was excised intact. The wound was closed with a 4-0 interrupted horizontal mattress suture. Telfa and Tegaderm dressing applied. Procedure was well tolerated. Ice pack  provided.  Tylenol if needed for soreness.     He will be asked to return in one week for suture removal  HPI, Physical Exam, Assessment and Plan have been scribed under the direction and in the presence of Robert Bellow, MD.  Karie Fetch, RN  I have completed the exam and reviewed the above documentation for accuracy and completeness.  I agree with the above.  Haematologist has been used and any errors in dictation or transcription are unintentional.  Hervey Ard, M.D., F.A.C.S.  Robert Bellow 07/03/2016, 9:24 PM

## 2016-07-02 NOTE — Patient Instructions (Addendum)
May shower May remove dressing in 2-3 days return in one week for suture removal May use an Ice pack as needed for comfort

## 2016-07-02 NOTE — Telephone Encounter (Signed)
Mailed unread message to patient. thanks 

## 2016-07-08 ENCOUNTER — Telehealth: Payer: Self-pay

## 2016-07-08 NOTE — Telephone Encounter (Signed)
-----   Message from Robert Bellow, MD sent at 07/07/2016  9:04 PM EDT ----- Please let the patient know that the pathology report on the cyst removed earlier this week was fine. Return for suture removal as scheduled. Thank you ----- Message ----- From: Interface, Lab In [facility] Sent: 07/07/2016   4:08 PM To: Robert Bellow, MD

## 2016-07-08 NOTE — Telephone Encounter (Signed)
Patient called back and is aware to results and to follow up for suture removal as scheduled

## 2016-07-09 ENCOUNTER — Ambulatory Visit: Payer: BLUE CROSS/BLUE SHIELD

## 2016-07-13 ENCOUNTER — Ambulatory Visit (INDEPENDENT_AMBULATORY_CARE_PROVIDER_SITE_OTHER): Payer: BLUE CROSS/BLUE SHIELD

## 2016-07-13 DIAGNOSIS — L723 Sebaceous cyst: Secondary | ICD-10-CM

## 2016-07-13 NOTE — Progress Notes (Signed)
Patient ID: Jacob Baker, male   DOB: February 24, 1970, 46 y.o.   MRN: [id number] Patient came in today for a wound check.  The wound is clean, with no signs of infection noted. 4 sutures removed. Edges approximated. Patient aware of his pathology results. Follow up as needed.

## 2016-07-17 DIAGNOSIS — F439 Reaction to severe stress, unspecified: Secondary | ICD-10-CM | POA: Diagnosis not present

## 2016-08-06 ENCOUNTER — Other Ambulatory Visit: Payer: Self-pay | Admitting: Internal Medicine

## 2016-08-12 DIAGNOSIS — M79671 Pain in right foot: Secondary | ICD-10-CM | POA: Diagnosis not present

## 2016-08-12 DIAGNOSIS — L03031 Cellulitis of right toe: Secondary | ICD-10-CM | POA: Diagnosis not present

## 2016-08-12 DIAGNOSIS — B351 Tinea unguium: Secondary | ICD-10-CM | POA: Diagnosis not present

## 2016-08-12 DIAGNOSIS — L600 Ingrowing nail: Secondary | ICD-10-CM | POA: Diagnosis not present

## 2016-09-04 ENCOUNTER — Other Ambulatory Visit: Payer: Self-pay | Admitting: Internal Medicine

## 2016-09-09 ENCOUNTER — Telehealth: Payer: Self-pay | Admitting: Internal Medicine

## 2016-09-09 MED ORDER — AMLODIPINE BESYLATE 10 MG PO TABS: ORAL_TABLET | ORAL | 3 refills | Status: DC

## 2016-09-09 NOTE — Telephone Encounter (Signed)
Patient notified and script called to pharmacy.

## 2016-09-09 NOTE — Telephone Encounter (Signed)
Pt called about not having no more refills for amLODipine (NORVASC) 10 MG tablet.  Pharmacy is [facility] [place], [place]. [place] [address]  Call pt @ [phone]. Thank you!

## 2016-09-11 ENCOUNTER — Other Ambulatory Visit: Payer: Self-pay

## 2016-09-11 ENCOUNTER — Telehealth: Payer: Self-pay | Admitting: Gastroenterology

## 2016-09-11 DIAGNOSIS — K222 Esophageal obstruction: Secondary | ICD-10-CM

## 2016-09-11 NOTE — Telephone Encounter (Signed)
Patient has been contacted to schedule EGD with dilation.  He feels food is getting stuck in his throat.  Dr. Allen Norris performed last one last year March 2017. Scheduled for 09/24/16.

## 2016-09-11 NOTE — Telephone Encounter (Signed)
Patient is gettting food stuck in his throat again and needs a repeat EGD. Please call

## 2016-09-11 NOTE — Telephone Encounter (Signed)
Patient called and food is getting stuck again. Does he need an appt or can he schedule an EGD to stretch his esophagus? Please call patient.

## 2016-09-14 ENCOUNTER — Encounter: Payer: Self-pay | Admitting: Endocrinology

## 2016-09-15 ENCOUNTER — Other Ambulatory Visit: Payer: Self-pay

## 2016-09-15 ENCOUNTER — Telehealth: Payer: Self-pay | Admitting: Endocrinology

## 2016-09-15 MED ORDER — SILDENAFIL CITRATE 100 MG PO TABS: 100.0000 mg | ORAL_TABLET | Freq: Every day | ORAL | 1 refills | Status: DC | PRN

## 2016-09-15 NOTE — Telephone Encounter (Signed)
Please advise, what dose of Viagra would you like to send in for patient? Thank you!

## 2016-09-15 NOTE — Telephone Encounter (Signed)
Patient called in reference to [facility] chart e-mail. Patient stated [facility] has not received Rx yet. Please call patient and advise.

## 2016-09-15 NOTE — Telephone Encounter (Signed)
I sent rx

## 2016-09-15 NOTE — Telephone Encounter (Signed)
Called and notified patient medication was submitted into pharmacy.

## 2016-09-19 DIAGNOSIS — Z23 Encounter for immunization: Secondary | ICD-10-CM | POA: Diagnosis not present

## 2016-09-21 ENCOUNTER — Encounter: Payer: Self-pay | Admitting: *Deleted

## 2016-09-23 NOTE — Discharge Instructions (Signed)
General Anesthesia, Adult, Care After °These instructions provide you with information about caring for yourself after your procedure. Your health care provider may also give you more specific instructions. Your treatment has been planned according to current medical practices, but problems sometimes occur. Call your health care provider if you have any problems or questions after your procedure. °What can I expect after the procedure? °After the procedure, it is common to have: °· Vomiting. °· A sore throat. °· Mental slowness. ° °It is common to feel: °· Nauseous. °· Cold or shivery. °· Sleepy. °· Tired. °· Sore or achy, even in parts of your body where you did not have surgery. ° °Follow these instructions at home: °For at least 24 hours after the procedure: °· Do not: °? Participate in activities where you could fall or become injured. °? Drive. °? Use heavy machinery. °? Drink alcohol. °? Take sleeping pills or medicines that cause drowsiness. °? Make important decisions or sign legal documents. °? Take care of children on your own. °· Rest. °Eating and drinking °· If you vomit, drink water, juice, or soup when you can drink without vomiting. °· Drink enough fluid to keep your urine clear or pale yellow. °· Make sure you have little or no nausea before eating solid foods. °· Follow the diet recommended by your health care provider. °General instructions °· Have a responsible adult stay with you until you are awake and alert. °· Return to your normal activities as told by your health care provider. Ask your health care provider what activities are safe for you. °· Take over-the-counter and prescription medicines only as told by your health care provider. °· If you smoke, do not smoke without supervision. °· Keep all follow-up visits as told by your health care provider. This is important. °Contact a health care provider if: °· You continue to have nausea or vomiting at home, and medicines are not helpful. °· You  cannot drink fluids or start eating again. °· You cannot urinate after 8-12 hours. °· You develop a skin rash. °· You have fever. °· You have increasing redness at the site of your procedure. °Get help right away if: °· You have difficulty breathing. °· You have chest pain. °· You have unexpected bleeding. °· You feel that you are having a life-threatening or urgent problem. °This information is not intended to replace advice given to you by your health care provider. Make sure you discuss any questions you have with your health care provider. °Document Released: 04/20/2000 Document Revised: 06/17/2015 Document Reviewed: 12/27/2014 °Elsevier Interactive Patient Education © 2018 [employer] ° °

## 2016-09-24 ENCOUNTER — Ambulatory Visit
Admission: RE | Admit: 2016-09-24 | Discharge: 2016-09-24 | Disposition: A | Discharge status: Home / Self Care | Payer: BLUE CROSS/BLUE SHIELD | Source: Ambulatory Visit | Attending: Gastroenterology | Admitting: Gastroenterology

## 2016-09-24 ENCOUNTER — Encounter
Admission: RE | Disposition: A | Discharge status: Home / Self Care | Payer: Self-pay | Source: Ambulatory Visit | Attending: Gastroenterology

## 2016-09-24 ENCOUNTER — Ambulatory Visit: Payer: BLUE CROSS/BLUE SHIELD | Admitting: Anesthesiology

## 2016-09-24 DIAGNOSIS — Z7984 Long term (current) use of oral hypoglycemic drugs: Secondary | ICD-10-CM | POA: Diagnosis not present

## 2016-09-24 DIAGNOSIS — I110 Hypertensive heart disease with heart failure: Secondary | ICD-10-CM | POA: Diagnosis not present

## 2016-09-24 DIAGNOSIS — Z833 Family history of diabetes mellitus: Secondary | ICD-10-CM | POA: Insufficient documentation

## 2016-09-24 DIAGNOSIS — E785 Hyperlipidemia, unspecified: Secondary | ICD-10-CM | POA: Insufficient documentation

## 2016-09-24 DIAGNOSIS — I509 Heart failure, unspecified: Secondary | ICD-10-CM | POA: Insufficient documentation

## 2016-09-24 DIAGNOSIS — Z7982 Long term (current) use of aspirin: Secondary | ICD-10-CM | POA: Diagnosis not present

## 2016-09-24 DIAGNOSIS — E119 Type 2 diabetes mellitus without complications: Secondary | ICD-10-CM | POA: Insufficient documentation

## 2016-09-24 DIAGNOSIS — Z79899 Other long term (current) drug therapy: Secondary | ICD-10-CM | POA: Diagnosis not present

## 2016-09-24 DIAGNOSIS — K222 Esophageal obstruction: Secondary | ICD-10-CM

## 2016-09-24 DIAGNOSIS — R1310 Dysphagia, unspecified: Secondary | ICD-10-CM

## 2016-09-24 DIAGNOSIS — K219 Gastro-esophageal reflux disease without esophagitis: Secondary | ICD-10-CM | POA: Diagnosis not present

## 2016-09-24 DIAGNOSIS — Z87891 Personal history of nicotine dependence: Secondary | ICD-10-CM | POA: Insufficient documentation

## 2016-09-24 DIAGNOSIS — Z8249 Family history of ischemic heart disease and other diseases of the circulatory system: Secondary | ICD-10-CM | POA: Diagnosis not present

## 2016-09-24 HISTORY — PX: ESOPHAGOGASTRODUODENOSCOPY (EGD) WITH PROPOFOL: SHX5813

## 2016-09-24 HISTORY — PX: ESOPHAGEAL DILATION: SHX303

## 2016-09-24 LAB — GLUCOSE, CAPILLARY
Glucose-Capillary: 194 mg/dL — ABNORMAL HIGH (ref 65–99)
Glucose-Capillary: 161 mg/dL — ABNORMAL HIGH (ref 65–99)

## 2016-09-24 SURGERY — ESOPHAGOGASTRODUODENOSCOPY (EGD) WITH PROPOFOL
Anesthesia: General | Site: Esophagus | Wound class: Clean Contaminated

## 2016-09-24 MED ORDER — ONDANSETRON HCL 4 MG/2ML IJ SOLN: 4.0000 mg | Freq: Once | INTRAMUSCULAR | Status: DC | PRN

## 2016-09-24 MED ORDER — LACTATED RINGERS IV SOLN
INTRAVENOUS | Status: DC
  Administered 2016-09-24: 07:00:00 via INTRAVENOUS

## 2016-09-24 MED ORDER — PROPOFOL 10 MG/ML IV BOLUS
INTRAVENOUS | Status: DC | PRN
  Administered 2016-09-24 (×2): 30 mg via INTRAVENOUS
  Administered 2016-09-24: 20 mg via INTRAVENOUS
  Administered 2016-09-24: 90 mg via INTRAVENOUS
  Administered 2016-09-24: 20 mg via INTRAVENOUS
  Administered 2016-09-24: 10 mg via INTRAVENOUS

## 2016-09-24 MED ORDER — GLYCOPYRROLATE 0.2 MG/ML IJ SOLN
INTRAMUSCULAR | Status: DC | PRN
  Administered 2016-09-24: 0.2 mg via INTRAVENOUS

## 2016-09-24 MED ORDER — SODIUM CHLORIDE 0.9 % IV SOLN
INTRAVENOUS | Status: DC
Start: 2016-09-24 — End: 2016-09-24

## 2016-09-24 MED ORDER — ACETAMINOPHEN 325 MG PO TABS: 650.0000 mg | ORAL_TABLET | Freq: Once | ORAL | Status: DC | PRN

## 2016-09-24 MED ORDER — STERILE WATER FOR IRRIGATION IR SOLN
Status: DC | PRN
  Administered 2016-09-24: 08:00:00

## 2016-09-24 MED ORDER — PANTOPRAZOLE SODIUM 40 MG PO TBEC: 40.0000 mg | DELAYED_RELEASE_TABLET | Freq: Every day | ORAL | 11 refills | Status: DC

## 2016-09-24 MED ORDER — LIDOCAINE HCL (CARDIAC) 20 MG/ML IV SOLN
INTRAVENOUS | Status: DC | PRN
  Administered 2016-09-24: 50 mg via INTRAVENOUS

## 2016-09-24 MED ORDER — PANTOPRAZOLE SODIUM 40 MG PO TBEC: 40.0000 mg | DELAYED_RELEASE_TABLET | Freq: Every day | ORAL | 1 refills | Status: DC

## 2016-09-24 MED ORDER — ACETAMINOPHEN 160 MG/5ML PO SOLN: 325.0000 mg | ORAL | Status: DC | PRN

## 2016-09-24 SURGICAL SUPPLY — 32 items
BALLN DILATOR 10-12 8 (BALLOONS)
BALLN DILATOR 12-15 8 (BALLOONS)
BALLN DILATOR 15-18 8 (BALLOONS) ×3
BALLN DILATOR CRE 0-12 8 (BALLOONS)
BALLN DILATOR ESOPH 8 10 CRE (MISCELLANEOUS) IMPLANT
BALLOON DILATOR 12-15 8 (BALLOONS) IMPLANT
BALLOON DILATOR 15-18 8 (BALLOONS) ×2 IMPLANT
BALLOON DILATOR CRE 0-12 8 (BALLOONS) IMPLANT
BLOCK BITE 60FR ADLT L/F GRN (MISCELLANEOUS) ×3 IMPLANT
CANISTER SUCT 1200ML W/VALVE (MISCELLANEOUS) ×3 IMPLANT
CLIP HMST 235XBRD CATH ROT (MISCELLANEOUS) IMPLANT
CLIP RESOLUTION 360 11X235 (MISCELLANEOUS)
FCP ESCP3.2XJMB 240X2.8X (MISCELLANEOUS)
FORCEPS BIOP RAD 4 LRG CAP 4 (CUTTING FORCEPS) IMPLANT
FORCEPS BIOP RJ4 240 W/NDL (MISCELLANEOUS)
FORCEPS ESCP3.2XJMB 240X2.8X (MISCELLANEOUS) IMPLANT
GOWN CVR UNV OPN BCK APRN NK (MISCELLANEOUS) ×4 IMPLANT
GOWN ISOL THUMB LOOP REG UNIV (MISCELLANEOUS) ×2
INJECTOR VARIJECT VIN23 (MISCELLANEOUS) IMPLANT
KIT DEFENDO VALVE AND CONN (KITS) IMPLANT
KIT ENDO PROCEDURE OLY (KITS) ×3 IMPLANT
MARKER SPOT ENDO TATTOO 5ML (MISCELLANEOUS) IMPLANT
PAD GROUND ADULT SPLIT (MISCELLANEOUS) IMPLANT
RETRIEVER NET PLAT FOOD (MISCELLANEOUS) IMPLANT
SNARE SHORT THROW 13M SML OVAL (MISCELLANEOUS) IMPLANT
SNARE SHORT THROW 30M LRG OVAL (MISCELLANEOUS) IMPLANT
SPOT EX ENDOSCOPIC TATTOO (MISCELLANEOUS)
SYR INFLATION 60ML (SYRINGE) ×3 IMPLANT
TRAP ETRAP POLY (MISCELLANEOUS) IMPLANT
VARIJECT INJECTOR VIN23 (MISCELLANEOUS)
WATER STERILE IRR 250ML POUR (IV SOLUTION) ×3 IMPLANT
WIRE CRE 18-20MM 8CM F G (MISCELLANEOUS) IMPLANT

## 2016-09-24 NOTE — Op Note (Signed)
[facility] Gastroenterology Patient Name: Jacob Baker Procedure Date: 09/24/2016 7:27 AM MRN: [id number] Account #: [id number] Date of Birth: [birth year] Admit Type: Outpatient Age: 46 Room: [facility] OR ROOM 01 Gender: Male Note Status: Finalized Procedure:            Upper GI endoscopy Indications:          Dysphagia Providers:            Lucilla Lame MD, MD Referring MD:         Deborra Medina, MD (Referring MD) Medicines:            Propofol per Anesthesia Complications:        No immediate complications. Procedure:            Pre-Anesthesia Assessment:                       - Prior to the procedure, a History and Physical was                        performed, and patient medications and allergies were                        reviewed. The patient's tolerance of previous                        anesthesia was also reviewed. The risks and benefits of                        the procedure and the sedation options and risks were                        discussed with the patient. All questions were                        answered, and informed consent was obtained. Prior                        Anticoagulants: The patient has taken no previous                        anticoagulant or antiplatelet agents. ASA Grade                        Assessment: II - A patient with mild systemic disease.                        After reviewing the risks and benefits, the patient was                        deemed in satisfactory condition to undergo the                        procedure.                       After obtaining informed consent, the endoscope was                        passed under direct vision. Throughout the procedure,  the patient's blood pressure, pulse, and oxygen                        saturations were monitored continuously. The Olympus                        GIF-HQ190 Endoscope (S#. [id number]) was introduced                        through the mouth,  and advanced to the second part of                        duodenum. The upper GI endoscopy was accomplished                        without difficulty. The patient tolerated the procedure                        well. Findings:      The examined esophagus was normal. A TTS dilator was passed through the       scope. Dilation with a 15-16.5-18 mm balloon dilator was performed to 18       mm. The dilation site was examined following endoscope reinsertion and       showed complete resolution of luminal narrowing.      The stomach was normal.      The examined duodenum was normal. Impression:           - Normal esophagus. Dilated.                       - Normal stomach.                       - Normal examined duodenum.                       - No specimens collected. Recommendation:       - Discharge patient to home.                       - Resume previous diet.                       - Continue present medications.                       - Use a proton pump inhibitor PO daily. Procedure Code(s):    --- Professional ---                       [phone], Esophagogastroduodenoscopy, flexible, transoral;                        with transendoscopic balloon dilation of esophagus                        (less than 30 mm diameter) Diagnosis Code(s):    --- Professional ---                       R13.10, Dysphagia, unspecified CPT copyright 2016 American Medical Association. All rights reserved. The codes documented in this report are preliminary and upon coder review may  be  revised to meet current compliance requirements. Lucilla Lame MD, MD 09/24/2016 7:58:41 AM This report has been signed electronically. Number of Addenda: 0 Note Initiated On: 09/24/2016 7:27 AM      [facility]

## 2016-09-24 NOTE — Anesthesia Postprocedure Evaluation (Signed)
Anesthesia Post Note  Patient: Jacob Baker  Procedure(s) Performed: Procedure(s) (LRB): ESOPHAGOGASTRODUODENOSCOPY (EGD) WITH PROPOFOL WITH DILATION (N/A)  Patient location during evaluation: PACU Anesthesia Type: General Level of consciousness: awake and alert, oriented and patient cooperative Pain management: pain level controlled Vital Signs Assessment: post-procedure vital signs reviewed and stable Respiratory status: spontaneous breathing, nonlabored ventilation and respiratory function stable Cardiovascular status: blood pressure returned to baseline and stable Postop Assessment: adequate PO intake Anesthetic complications: no    Darrin Nipper

## 2016-09-24 NOTE — Anesthesia Preprocedure Evaluation (Signed)
Anesthesia Evaluation  Patient identified by MRN, date of birth, ID band Patient awake    Reviewed: Allergy & Precautions, NPO status , Patient's Chart, lab work & pertinent test results  History of Anesthesia Complications Negative for: history of anesthetic complications  Airway Mallampati: III  TM Distance: >3 FB Neck ROM: Full    Dental no notable dental hx.    Pulmonary Current Smoker (1 ppd),    Pulmonary exam normal breath sounds clear to auscultation       Cardiovascular Exercise Tolerance: Good hypertension, Normal cardiovascular exam Rhythm:Regular Rate:Normal     Neuro/Psych PSYCHIATRIC DISORDERS Anxiety negative neurological ROS     GI/Hepatic hiatal hernia, GERD  ,Esophageal stricture/stenosis   Endo/Other  diabetes, Type 2  Renal/GU negative Renal ROS     Musculoskeletal   Abdominal   Peds  Hematology negative hematology ROS (+)   Anesthesia Other Findings   Reproductive/Obstetrics                             Anesthesia Physical Anesthesia Plan  ASA: II  Anesthesia Plan: General   Post-op Pain Management:    Induction: Intravenous  PONV Risk Score and Plan: 1 and Ondansetron and Propofol infusion  Airway Management Planned: Natural Airway  Additional Equipment:   Intra-op Plan:   Post-operative Plan:   Informed Consent: I have reviewed the patients History and Physical, chart, labs and discussed the procedure including the risks, benefits and alternatives for the proposed anesthesia with the patient or authorized representative who has indicated his/her understanding and acceptance.     Plan Discussed with: CRNA  Anesthesia Plan Comments:         Anesthesia Quick Evaluation

## 2016-09-24 NOTE — Anesthesia Procedure Notes (Signed)
Performed by: Eytan Carrigan Pre-anesthesia Checklist: Patient identified, Emergency Drugs available, Suction available, Timeout performed and Patient being monitored Patient Re-evaluated:Patient Re-evaluated prior to induction Oxygen Delivery Method: Nasal cannula Placement Confirmation: positive ETCO2       

## 2016-09-24 NOTE — H&P (Signed)
Jacob Lame, MD [address] [place], [place] [address] Phone:[phone] Fax : [phone]  Primary Care Physician:  [name], MD Primary Gastroenterologist:  Dr. Allen Norris  Pre-Procedure History & Physical: HPI:  Jacob Baker is a 46 y.o. male is here for an endoscopy.   Past Medical History:  Diagnosis Date  . CHF (congestive heart failure) ([place])   . Diabetes mellitus without complication ([place])   . GERD (gastroesophageal reflux disease)   . History of migraine    none in over 2 yrs  . Hyperlipidemia   . Hypertension   . Syncope and collapse    x1 - approx 2-3 yrs ago - dehydration    Past Surgical History:  Procedure Laterality Date  . COLONOSCOPY    . ESOPHAGOGASTRODUODENOSCOPY (EGD) WITH PROPOFOL N/A 04/08/2015   Procedure: ESOPHAGOGASTRODUODENOSCOPY (EGD) WITH PROPOFOL with dialation;  Surgeon: Jacob Lame, MD;  Location: [place];  Service: Endoscopy;  Laterality: N/A;  diabetic - oral meds  . FINGER AMPUTATION  2009   partial removal left index finger     Prior to Admission medications   Medication Sig Start Date End Date Taking? Authorizing Provider  amLODipine (NORVASC) 10 MG tablet TAKE 1 TABLET(10 MG) BY MOUTH DAILY 09/09/16  Yes [name], MD  buPROPion [facility] SR) 150 MG 12 hr tablet Take 200 mg by mouth 2 (two) times daily.   Yes [provider]  ciclopirox (PENLAC) 8 % solution Apply topically. 06/04/14  Yes [provider]  clomiPHENE (CLOMID) 50 MG tablet 1/4 tab daily 10/18/15  Yes Renato Shin, MD  cyanocobalamin 1000 MCG tablet Take 100 mcg by mouth daily.   Yes [provider]  EQ NICOTINE 14 MG/24HR patch APPLY ONE PATCH TOPICALLY ONCE DAILY (REPLACES PREVIOUSLY SENT 21 MCG DOSE) 06/14/15  Yes [name], MD  glucose blood (ONETOUCH VERIO) test strip Test sugars twice daily 04/26/14  Yes Phadke, Radhika P, MD  metFORMIN (GLUCOPHAGE) 500 MG tablet TAKE ONE TABLET BY MOUTH TWICE  DAILY WITH A MEAL 01/08/16  Yes Renato Shin, MD  nicotine (NICODERM CQ - DOSED IN MG/24 HOURS) 21 mg/24hr patch Place 1 patch (21 mg total) onto the skin daily. 06/11/16  Yes [name], MD  [facility] DELICA LANCETS 42V MISC Test sugars twice daily 04/26/14  Yes Phadke, Radhika P, MD  sildenafil (VIAGRA) 100 MG tablet Take 1 tablet (100 mg total) by mouth daily as needed for erectile dysfunction. 09/15/16  Yes Renato Shin, MD  aspirin 81 MG tablet Take 81 mg by mouth daily.    [provider]  ibuprofen (ADVIL,MOTRIN) 800 MG tablet Take 1 tablet (800 mg total) by mouth 3 (three) times daily. Patient not taking: Reported on 09/24/2016 06/14/14   Noemi Chapel, MD    Allergies as of 09/11/2016  . (No Known Allergies)    Family History  Problem Relation Age of Onset  . Heart disease Father        CAD  . Hypertension Father   . Diabetes Father   . Hypertension Sister   . Hypertension Brother   . Hypertension Brother   . Hypertension Mother     Social History   Social History  . Marital status: Married    Spouse name: N/A  . Number of children: N/A  . Years of education: N/A   Occupational History  . Not on file.   Social History Main Topics  . Smoking status: Former Smoker  Packs/day: 2.00    Years: 27.00    Types: Cigarettes    Quit date: 03/21/2015  . Smokeless tobacco: Never Used  . Alcohol use No  . Drug use: No  . Sexual activity: Not on file   Other Topics Concern  . Not on file   Social History Narrative  . No narrative on file    Review of Systems: See HPI, otherwise negative ROS  Physical Exam: BP (!) 153/94   Pulse 73   Temp 97.7 F (36.5 C) (Temporal)   Resp 16   Ht 6\' 1"  (1.854 m)   Wt 199 lb (90.3 kg)   SpO2 100%   BMI 26.25 kg/m  General:   Alert,  pleasant and cooperative in NAD Head:  Normocephalic and atraumatic. Neck:  Supple; no masses or thyromegaly. Lungs:  Clear throughout to auscultation.    Heart:  Regular rate  and rhythm. Abdomen:  Soft, nontender and nondistended. Normal bowel sounds, without guarding, and without rebound.   Neurologic:  Alert and  oriented x4;  grossly normal neurologically.  Impression/Plan: [name] is here for an endoscopy to be performed for dysphagia  Risks, benefits, limitations, and alternatives regarding  endoscopy have been reviewed with the patient.  Questions have been answered.  All parties agreeable.   Jacob Lame, MD  09/24/2016, 7:32 AM

## 2016-09-24 NOTE — Transfer of Care (Signed)
Immediate Anesthesia Transfer of Care Note  Patient: Jacob Baker  Procedure(s) Performed: Procedure(s) with comments: ESOPHAGOGASTRODUODENOSCOPY (EGD) WITH PROPOFOL WITH DILATION (N/A) - Diabetic - oral meds  Patient Location: PACU  Anesthesia Type: General  Level of Consciousness: awake, alert  and patient cooperative  Airway and Oxygen Therapy: Patient Spontanous Breathing and Patient connected to supplemental oxygen  Post-op Assessment: Post-op Vital signs reviewed, Patient's Cardiovascular Status Stable, Respiratory Function Stable, Patent Airway and No signs of Nausea or vomiting  Post-op Vital Signs: Reviewed and stable  Complications: No apparent anesthesia complications

## 2016-09-25 ENCOUNTER — Encounter: Payer: Self-pay | Admitting: Gastroenterology

## 2016-10-07 ENCOUNTER — Other Ambulatory Visit: Payer: Self-pay | Admitting: Internal Medicine

## 2016-10-13 ENCOUNTER — Encounter: Payer: Self-pay | Admitting: Internal Medicine

## 2016-10-13 ENCOUNTER — Ambulatory Visit (INDEPENDENT_AMBULATORY_CARE_PROVIDER_SITE_OTHER): Payer: BLUE CROSS/BLUE SHIELD | Admitting: Endocrinology

## 2016-10-13 ENCOUNTER — Encounter: Payer: Self-pay | Admitting: Endocrinology

## 2016-10-13 ENCOUNTER — Telehealth: Payer: Self-pay | Admitting: Internal Medicine

## 2016-10-13 ENCOUNTER — Telehealth: Payer: Self-pay | Admitting: *Deleted

## 2016-10-13 VITALS — BP 106/80 | HR 85 | Wt 201.4 lb

## 2016-10-13 DIAGNOSIS — R7989 Other specified abnormal findings of blood chemistry: Secondary | ICD-10-CM

## 2016-10-13 DIAGNOSIS — E119 Type 2 diabetes mellitus without complications: Secondary | ICD-10-CM | POA: Diagnosis not present

## 2016-10-13 LAB — CBC WITH DIFFERENTIAL/PLATELET
BASOS ABS: 0 10*3/uL (ref 0.0–0.1)
Basophils Relative: 0.2 % (ref 0.0–3.0)
EOS PCT: 0.9 % (ref 0.0–5.0)
Eosinophils Absolute: 0.1 10*3/uL (ref 0.0–0.7)
HCT: 41.6 % (ref 39.0–52.0)
Hemoglobin: 14.2 g/dL (ref 13.0–17.0)
LYMPHS ABS: 1.6 10*3/uL (ref 0.7–4.0)
Lymphocytes Relative: 20.2 % (ref 12.0–46.0)
MCHC: 34.2 g/dL (ref 30.0–36.0)
MCV: 84.3 fl (ref 78.0–100.0)
MONO ABS: 0.5 10*3/uL (ref 0.1–1.0)
Monocytes Relative: 6.7 % (ref 3.0–12.0)
NEUTROS ABS: 5.6 10*3/uL (ref 1.4–7.7)
NEUTROS PCT: 72 % (ref 43.0–77.0)
PLATELETS: 233 10*3/uL (ref 150.0–400.0)
RBC: 4.94 Mil/uL (ref 4.22–5.81)
RDW: 13.5 % (ref 11.5–15.5)
WBC: 7.7 10*3/uL (ref 4.0–10.5)

## 2016-10-13 LAB — PSA: PSA: 0.87 ng/mL (ref 0.10–4.00)

## 2016-10-13 LAB — POCT GLYCOSYLATED HEMOGLOBIN (HGB A1C): HEMOGLOBIN A1C: 6.7

## 2016-10-13 NOTE — Telephone Encounter (Signed)
Placed in red folder  

## 2016-10-13 NOTE — Telephone Encounter (Signed)
Pt requested to know the progress of the paperwork that was dropped off in reference to his job. Pt contact [phone]

## 2016-10-13 NOTE — Progress Notes (Signed)
Subjective:    Patient ID: Jacob Baker, male    DOB: Feb 17, 1970, 46 y.o.   MRN: [id number]  HPI Pt returns for f/u of idiopathic central hypogonadism (dx'ed 2016; he has 4 biological children; pituitary MRI was normal; he was rx'ed clomid).   He takes clomid as rx'ed.  He has intermitt slight dizziness sensation in the head, and assoc diaphoresis.  He has never checked cbg during the episode. He wants to stop clomid if he can, based on the testosterone result.   Past Medical History:  Diagnosis Date  . CHF (congestive heart failure) ([place])   . Diabetes mellitus without complication ([place])   . GERD (gastroesophageal reflux disease)   . History of migraine    none in over 2 yrs  . Hyperlipidemia   . Hypertension   . Syncope and collapse    x1 - approx 2-3 yrs ago - dehydration    Past Surgical History:  Procedure Laterality Date  . COLONOSCOPY    . ESOPHAGEAL DILATION  09/24/2016   Procedure: ESOPHAGEAL DILATION;  Surgeon: Lucilla Lame, MD;  Location: [place];  Service: Gastroenterology;;  . ESOPHAGOGASTRODUODENOSCOPY (EGD) WITH PROPOFOL N/A 04/08/2015   Procedure: ESOPHAGOGASTRODUODENOSCOPY (EGD) WITH PROPOFOL with dialation;  Surgeon: Lucilla Lame, MD;  Location: [place];  Service: Endoscopy;  Laterality: N/A;  diabetic - oral meds  . ESOPHAGOGASTRODUODENOSCOPY (EGD) WITH PROPOFOL N/A 09/24/2016   Procedure: ESOPHAGOGASTRODUODENOSCOPY (EGD) WITH PROPOFOL WITH DILATION;  Surgeon: Lucilla Lame, MD;  Location: [place];  Service: Gastroenterology;  Laterality: N/A;  Diabetic - oral meds  . FINGER AMPUTATION  2009   partial removal left index finger     Social History   Social History  . Marital status: Married    Spouse name: N/A  . Number of children: N/A  . Years of education: N/A   Occupational History  . Not on file.   Social History Main Topics  . Smoking status: Former Smoker    Packs/day: 2.00    Years: 27.00    Types: Cigarettes    Quit date: 03/21/2015  . Smokeless tobacco: Never Used  . Alcohol use No  . Drug use: No  . Sexual activity: Not on file   Other Topics Concern  . Not on file   Social History Narrative  . No narrative on file    Current Outpatient Prescriptions on File Prior to Visit  Medication Sig Dispense Refill  . amLODipine (NORVASC) 10 MG tablet TAKE 1 TABLET(10 MG) BY MOUTH DAILY 30 tablet 3  . amLODipine (NORVASC) 10 MG tablet TAKE 1 TABLET(10 MG) BY MOUTH DAILY 30 tablet 0  . aspirin 81 MG tablet Take 81 mg by mouth daily.    Marland Kitchen buPROPion (WELLBUTRIN SR) 150 MG 12 hr tablet Take 200 mg by mouth 2 (two) times daily.    . ciclopirox (PENLAC) 8 % solution Apply topically.    . clomiPHENE (CLOMID) 50 MG tablet 1/4 tab daily 15 tablet 5  . cyanocobalamin 1000 MCG tablet Take 100 mcg by mouth daily.    Noelle Penner NICOTINE 14 MG/24HR patch APPLY ONE PATCH TOPICALLY ONCE DAILY (REPLACES PREVIOUSLY SENT 21 MCG DOSE) 28 patch 0  . glucose blood (ONETOUCH VERIO) test strip Test sugars twice daily 50 each 6  . ibuprofen (ADVIL,MOTRIN) 800 MG tablet Take 1 tablet (800 mg total) by mouth 3 (three) times daily. (Patient not taking: Reported on 09/24/2016) 21 tablet 0  . metFORMIN (GLUCOPHAGE) 500 MG tablet TAKE  ONE TABLET BY MOUTH TWICE DAILY WITH A MEAL 180 tablet 3  . nicotine (NICODERM CQ - DOSED IN MG/24 HOURS) 21 mg/24hr patch Place 1 patch (21 mg total) onto the skin daily. 28 patch 0  . ONETOUCH DELICA LANCETS 70W MISC Test sugars twice daily 50 each 3  . pantoprazole (PROTONIX) 40 MG tablet Take 1 tablet (40 mg total) by mouth daily. 30 tablet 11  . pantoprazole (PROTONIX) 40 MG tablet Take 1 tablet (40 mg total) by mouth daily. 30 tablet 1  . sildenafil (VIAGRA) 100 MG tablet Take 1 tablet (100 mg total) by mouth daily as needed for erectile dysfunction. 30 tablet 1   No current facility-administered medications on file prior to visit.     No Known Allergies  Family History  Problem Relation Age of  Onset  . Heart disease Father        CAD  . Hypertension Father   . Diabetes Father   . Hypertension Sister   . Hypertension Brother   . Hypertension Brother   . Hypertension Mother    BP 106/80   Pulse 85   Wt 201 lb 6.4 oz (91.4 kg)   SpO2 97%   BMI 26.57 kg/m   Review of Systems Denies decreased urination.  He has fatigue.     Objective:   Physical Exam VITAL SIGNS:  See vs page GENERAL: no distress Ext: no edema.  Lab Results  Component Value Date   HGBA1C 6.7 10/13/2016      Assessment & Plan:  Type 2 DM: new to me.  Dizziness, uncertain etiology Hypogonadism: due for recheck.   Patient Instructions  blood tests are requested for you today.  We'll let you know about the results.  Try checking your blood sugar during an episode of dizziness.  Please tell Dr Derrel Nip if it is below 33.  If the symptoms recur, please tell her even if your blood sugar is normal.  Testosterone treatment has risks, including increased or decreased fertility (depending on the type of treatment), hair loss, prostate cancer, benign prostate enlargement, blood clots, liver problems, lower hdl ("good cholesterol"), polycythemia (opposite of anemia), sleep apnea, and behavior changes.   Please come back for a follow-up appointment in 1 year.

## 2016-10-13 NOTE — Patient Instructions (Addendum)
blood tests are requested for you today.  We'll let you know about the results.  Try checking your blood sugar during an episode of dizziness.  Please tell Dr Derrel Nip if it is below 74.  If the symptoms recur, please tell her even if your blood sugar is normal.  Testosterone treatment has risks, including increased or decreased fertility (depending on the type of treatment), hair loss, prostate cancer, benign prostate enlargement, blood clots, liver problems, lower hdl ("good cholesterol"), polycythemia (opposite of anemia), sleep apnea, and behavior changes.   Please come back for a follow-up appointment in 1 year.

## 2016-10-13 NOTE — Telephone Encounter (Signed)
Pt dropped off health form to be filled out and faxed to [phone]. Placed in folder up front

## 2016-10-13 NOTE — Telephone Encounter (Signed)
LMTCB

## 2016-10-14 LAB — TESTOSTERONE,FREE AND TOTAL
TESTOSTERONE FREE: 7.2 pg/mL (ref 6.8–21.5)
TESTOSTERONE: 455 ng/dL (ref 264–916)

## 2016-10-14 NOTE — Telephone Encounter (Signed)
Spoke with pt and informed him that we need for him to measure both his hips and his waist and call us back with the results. The pt stated that he would give [place] a call back.

## 2016-10-14 NOTE — Telephone Encounter (Signed)
Called pt to let him know that it may take Dr. Derrel Nip a day or two to get his paperwork filled out but that we would let him know as soon as we did.

## 2016-10-14 NOTE — Telephone Encounter (Signed)
Completed based on May visit and labs.  Needs waist and hip measurements which he should check himself and give you the readings

## 2016-10-15 ENCOUNTER — Ambulatory Visit: Payer: BLUE CROSS/BLUE SHIELD | Admitting: Endocrinology

## 2016-10-15 NOTE — Telephone Encounter (Signed)
Pt called back to give his Hip size 22 and Waist size 35.5. Thank you!

## 2016-10-17 ENCOUNTER — Emergency Department (HOSPITAL_COMMUNITY)
Admission: EM | Admit: 2016-10-17 | Discharge: 2016-10-17 | Disposition: A | Discharge status: Home / Self Care | Payer: BLUE CROSS/BLUE SHIELD | Attending: Emergency Medicine | Admitting: Emergency Medicine

## 2016-10-17 ENCOUNTER — Encounter (HOSPITAL_COMMUNITY): Payer: Self-pay | Admitting: Emergency Medicine

## 2016-10-17 ENCOUNTER — Emergency Department (HOSPITAL_COMMUNITY): Payer: BLUE CROSS/BLUE SHIELD

## 2016-10-17 DIAGNOSIS — I110 Hypertensive heart disease with heart failure: Secondary | ICD-10-CM | POA: Diagnosis not present

## 2016-10-17 DIAGNOSIS — Z7984 Long term (current) use of oral hypoglycemic drugs: Secondary | ICD-10-CM | POA: Insufficient documentation

## 2016-10-17 DIAGNOSIS — Z79899 Other long term (current) drug therapy: Secondary | ICD-10-CM | POA: Diagnosis not present

## 2016-10-17 DIAGNOSIS — E1140 Type 2 diabetes mellitus with diabetic neuropathy, unspecified: Secondary | ICD-10-CM | POA: Insufficient documentation

## 2016-10-17 DIAGNOSIS — Z7982 Long term (current) use of aspirin: Secondary | ICD-10-CM | POA: Insufficient documentation

## 2016-10-17 DIAGNOSIS — R1012 Left upper quadrant pain: Secondary | ICD-10-CM | POA: Diagnosis not present

## 2016-10-17 DIAGNOSIS — R0789 Other chest pain: Secondary | ICD-10-CM

## 2016-10-17 DIAGNOSIS — I509 Heart failure, unspecified: Secondary | ICD-10-CM | POA: Insufficient documentation

## 2016-10-17 DIAGNOSIS — F17210 Nicotine dependence, cigarettes, uncomplicated: Secondary | ICD-10-CM | POA: Insufficient documentation

## 2016-10-17 DIAGNOSIS — R079 Chest pain, unspecified: Secondary | ICD-10-CM | POA: Diagnosis not present

## 2016-10-17 LAB — COMPREHENSIVE METABOLIC PANEL
ALBUMIN: 4.2 g/dL (ref 3.5–5.0)
ALT: 35 U/L (ref 17–63)
AST: 26 U/L (ref 15–41)
Alkaline Phosphatase: 80 U/L (ref 38–126)
Anion gap: 9 (ref 5–15)
BUN: 7 mg/dL (ref 6–20)
CO2: 24 mmol/L (ref 22–32)
Calcium: 9 mg/dL (ref 8.9–10.3)
Chloride: 104 mmol/L (ref 101–111)
Creatinine, Ser: 1.11 mg/dL (ref 0.61–1.24)
GFR calc Af Amer: >60 mL/min (ref >60)
GFR calc non Af Amer: >60 mL/min (ref >60)
GLUCOSE: 152 mg/dL — AB (ref 65–99)
POTASSIUM: 3.4 mmol/L — AB (ref 3.5–5.1)
Sodium: 137 mmol/L (ref 135–145)
Total Bilirubin: 0.6 mg/dL (ref 0.3–1.2)
Total Protein: 6.7 g/dL (ref 6.5–8.1)

## 2016-10-17 LAB — CBC
HEMATOCRIT: 37 % — AB (ref 39.0–52.0)
Hemoglobin: 13.3 g/dL (ref 13.0–17.0)
MCH: 29.2 pg (ref 26.0–34.0)
MCHC: 35.9 g/dL (ref 30.0–36.0)
MCV: 81.3 fL (ref 78.0–100.0)
PLATELETS: 209 10*3/uL (ref 150–400)
RBC: 4.55 MIL/uL (ref 4.22–5.81)
RDW: 12.8 % (ref 11.5–15.5)
WBC: 6.2 10*3/uL (ref 4.0–10.5)

## 2016-10-17 LAB — LIPASE, BLOOD: LIPASE: 34 U/L (ref 11–51)

## 2016-10-17 LAB — D-DIMER, QUANTITATIVE: D-Dimer, Quant: <0.27 ug/mL-FEU (ref 0.00–0.50)

## 2016-10-17 LAB — TROPONIN I: Troponin I: <0.03 ng/mL (ref <0.03)

## 2016-10-17 MED ORDER — MORPHINE SULFATE (PF) 4 MG/ML IV SOLN
4.0000 mg | Freq: Once | INTRAVENOUS | Status: AC
  Administered 2016-10-17: 4 mg via INTRAVENOUS
  Filled 2016-10-17: qty 1

## 2016-10-17 MED ORDER — GI COCKTAIL ~~LOC~~
30.0000 mL | Freq: Once | ORAL | Status: AC
  Administered 2016-10-17: 30 mL via ORAL
  Filled 2016-10-17: qty 30

## 2016-10-17 NOTE — ED Provider Notes (Signed)
[facility] DEPT Provider Note   CSN: [id number] Arrival date & time: 10/17/16  1744     History   Chief Complaint Chief Complaint  Patient presents with  . Chest Pain    HPI Jacob Baker is a 46 y.o. male.  The history is provided by the patient. No language interpreter was used.  Chest Pain     Jacob Baker is a 46 y.o. male who presents to the Emergency Department complaining of chest pain.  He reports sharp, left-sided chest pain that wraps around to his back that started yesterday. Pain is constant in nature and worse with deep breaths and activity. He denies any fevers, cough, nausea, vomiting, change in appetite, leg swelling or pain. No recent injuries. He works as a Administrator and has a history of hypertension, diabetes, hyperlipidemia. He does smoke and has a family history of cardiac disease in his father in the his 54s.  Sxs are moderate, constant in nature.   Past Medical History:  Diagnosis Date  . CHF (congestive heart failure) ([place])   . Diabetes mellitus without complication ([place])   . GERD (gastroesophageal reflux disease)   . History of migraine    none in over 2 yrs  . Hyperlipidemia   . Hypertension   . Syncope and collapse    x1 - approx 2-3 yrs ago - dehydration    Patient Active Problem List   Diagnosis Date Noted  . Epidermal inclusion cyst 06/13/2016  . Fatigue 06/13/2016  . Chronic pain of right hip 10/15/2015  . Neuropathy 07/24/2015  . Essential hypertension 07/24/2015  . Hypertriglyceridemia 07/24/2015  . Problems with swallowing and mastication   . Stricture and stenosis of esophagus   . Encounter for preventive health examination 10/26/2014  . Pituitary insufficiency ([place]) 09/12/2014  . Low testosterone 09/11/2014  . Onychomycosis 04/18/2014  . Diabetes mellitus without complication ([place]) 02/03/3233  . Tobacco abuse disorder 11/24/2013  . Hiatal hernia 11/15/2013  . Tubular adenoma of colon 11/15/2013  . History of  Helicobacter pylori infection 10/18/2013  . Gastritis due to nonsteroidal anti-inflammatory drug (NSAID) 10/14/2013  . Anxiety 04/26/2013  . Chest pain 10/12/2011  . Back ache 09/28/2011    Past Surgical History:  Procedure Laterality Date  . COLONOSCOPY    . ESOPHAGEAL DILATION  09/24/2016   Procedure: ESOPHAGEAL DILATION;  Surgeon: Lucilla Lame, MD;  Location: [facility];  Service: Gastroenterology;;  . ESOPHAGOGASTRODUODENOSCOPY (EGD) WITH PROPOFOL N/A 04/08/2015   Procedure: ESOPHAGOGASTRODUODENOSCOPY (EGD) WITH PROPOFOL with dialation;  Surgeon: Lucilla Lame, MD;  Location: [facility];  Service: Endoscopy;  Laterality: N/A;  diabetic - oral meds  . ESOPHAGOGASTRODUODENOSCOPY (EGD) WITH PROPOFOL N/A 09/24/2016   Procedure: ESOPHAGOGASTRODUODENOSCOPY (EGD) WITH PROPOFOL WITH DILATION;  Surgeon: Lucilla Lame, MD;  Location: [facility];  Service: Gastroenterology;  Laterality: N/A;  Diabetic - oral meds  . FINGER AMPUTATION  2009   partial removal left index finger        Home Medications    Prior to Admission medications   Medication Sig Start Date End Date Taking? Authorizing Provider  amLODipine (NORVASC) 10 MG tablet TAKE 1 TABLET(10 MG) BY MOUTH DAILY 09/09/16  Yes [name], MD  aspirin 81 MG tablet Take 81 mg by mouth daily.   Yes [provider]  buPROPion (WELLBUTRIN SR) 150 MG 12 hr tablet Take 200 mg by mouth 2 (two) times daily.   Yes [provider]  clomiPHENE (CLOMID) 50 MG tablet 1/4  tab daily Patient taking differently: Take 12.5 mg by mouth daily. 1/4 tab daily 10/18/15  Yes Renato Shin, MD  cyanocobalamin 1000 MCG tablet Take 100 mcg by mouth daily.   Yes [provider]  EQ NICOTINE 14 MG/24HR patch APPLY ONE PATCH TOPICALLY ONCE DAILY (REPLACES PREVIOUSLY SENT 21 MCG DOSE) 06/14/15  Yes [name], MD  glucose blood (ONETOUCH VERIO) test strip Test sugars twice daily 04/26/14  Yes Phadke, Radhika P, MD    lansoprazole (PREVACID) 15 MG capsule Take 15 mg by mouth daily.   Yes [provider]  metFORMIN (GLUCOPHAGE) 500 MG tablet TAKE ONE TABLET BY MOUTH TWICE DAILY WITH A MEAL 01/08/16  Yes Renato Shin, MD  [facility] DELICA LANCETS 31S MISC Test sugars twice daily 04/26/14  Yes Phadke, Radhika P, MD  sildenafil (VIAGRA) 100 MG tablet Take 1 tablet (100 mg total) by mouth daily as needed for erectile dysfunction. Patient not taking: Reported on 10/17/2016 09/15/16   Renato Shin, MD    Family History Family History  Problem Relation Age of Onset  . Heart disease Father        CAD  . Hypertension Father   . Diabetes Father   . Hypertension Sister   . Hypertension Brother   . Hypertension Brother   . Hypertension Mother     Social History Social History  Substance Use Topics  . Smoking status: Current Every Day Smoker    Packs/day: 2.00    Years: 27.00    Types: Cigarettes  . Smokeless tobacco: Never Used  . Alcohol use No     Allergies   Patient has no known allergies.   Review of Systems Review of Systems  Cardiovascular: Positive for chest pain.  All other systems reviewed and are negative.    Physical Exam Updated Vital Signs BP (!) 141/98 (BP Location: Left Arm)   Pulse 73   Temp 98.7 F (37.1 C) (Oral)   Resp 18   Ht 6\' 1"  (1.854 m)   Wt 91.2 kg (201 lb)   SpO2 99%   BMI 26.52 kg/m   Physical Exam  Constitutional: He is oriented to person, place, and time. He appears well-developed and well-nourished.  HENT:  Head: Normocephalic and atraumatic.  Cardiovascular: Normal rate and regular rhythm.   No murmur heard. Pulmonary/Chest: Effort normal and breath sounds normal. No respiratory distress. He exhibits no tenderness.  Abdominal: Soft. There is no rebound and no guarding.  Mild LUQ tenderness  Musculoskeletal: He exhibits no edema or tenderness.  Neurological: He is alert and oriented to person, place, and time.  Skin: Skin is warm and  dry.  Psychiatric: He has a normal mood and affect. His behavior is normal.  Nursing note and vitals reviewed.    ED Treatments / Results  Labs (all labs ordered are listed, but only abnormal results are displayed) Labs Reviewed  CBC - Abnormal; Notable for the following:       Result Value   HCT 37.0 (*)    All other components within normal limits  COMPREHENSIVE METABOLIC PANEL - Abnormal; Notable for the following:    Potassium 3.4 (*)    Glucose, Bld 152 (*)    All other components within normal limits  TROPONIN I  LIPASE, BLOOD  D-DIMER, QUANTITATIVE (NOT AT [facility])  TROPONIN I    EKG  EKG Interpretation None       Radiology Dg Chest 2 View  Result Date: 10/17/2016 CLINICAL DATA:  46 y/o  M; 2 days of chest pain. EXAM: CHEST  2 VIEW COMPARISON:  06/14/2014 chest radiograph FINDINGS: Stable heart size and mediastinal contours are within normal limits. Both lungs are clear. The visualized skeletal structures are unremarkable. IMPRESSION: No active cardiopulmonary disease. Electronically Signed   By: Kristine Garbe M.D.   On: 10/17/2016 18:11    Procedures Procedures (including critical care time)  Medications Ordered in ED Medications  morphine 4 MG/ML injection 4 mg (4 mg Intravenous Given 10/17/16 1858)  gi cocktail (Maalox,Lidocaine,Donnatal) (30 mLs Oral Given 10/17/16 2015)     Initial Impression / Assessment and Plan / ED Course  I have reviewed the triage vital signs and the nursing notes.  Pertinent labs & imaging results that were available during my care of the patient were reviewed by me and considered in my medical decision making (see chart for details).     Patient here for evaluation of sharp and constant left-sided chest pain. EKG without any ischemic changes and labs are within normal limits. Presentation is not consistent with PE, ACS, dissection. Discussed with patient likely musculoskeletal chest pain. Discussed home care with rest,  heat, NSAIDs. Discussed outpatient follow-up and return precautions.  Final Clinical Impressions(s) / ED Diagnoses   Final diagnoses:  Atypical chest pain    New Prescriptions Discharge Medication List as of 10/17/2016 10:01 PM       Quintella Reichert, MD 10/17/16 2341

## 2016-10-17 NOTE — ED Notes (Signed)
ED Provider at bedside. 

## 2016-10-17 NOTE — ED Triage Notes (Signed)
Pt reports left sided chest pain going down left arm with no other symptoms since last night.  Intermittent in nature.  No meds taken.

## 2016-10-19 ENCOUNTER — Encounter: Payer: Self-pay | Admitting: Internal Medicine

## 2016-10-19 DIAGNOSIS — Z0279 Encounter for issue of other medical certificate: Secondary | ICD-10-CM

## 2016-10-19 NOTE — Telephone Encounter (Signed)
Pt has made an appt for 10/26/2016 to get a referral for a MRI with Dr. Derrel Nip.

## 2016-10-19 NOTE — Telephone Encounter (Signed)
Form has been faxed.  LMTCB. Need to ask pt if he would like to have a copy of the form.

## 2016-10-21 NOTE — Telephone Encounter (Signed)
Error

## 2016-10-23 ENCOUNTER — Encounter: Payer: Self-pay | Admitting: Emergency Medicine

## 2016-10-23 ENCOUNTER — Emergency Department
Admission: EM | Admit: 2016-10-23 | Discharge: 2016-10-24 | Disposition: A | Discharge status: Home / Self Care | Payer: BLUE CROSS/BLUE SHIELD | Attending: Emergency Medicine | Admitting: Emergency Medicine

## 2016-10-23 ENCOUNTER — Emergency Department: Payer: BLUE CROSS/BLUE SHIELD

## 2016-10-23 DIAGNOSIS — I509 Heart failure, unspecified: Secondary | ICD-10-CM | POA: Diagnosis not present

## 2016-10-23 DIAGNOSIS — F17210 Nicotine dependence, cigarettes, uncomplicated: Secondary | ICD-10-CM | POA: Insufficient documentation

## 2016-10-23 DIAGNOSIS — E1140 Type 2 diabetes mellitus with diabetic neuropathy, unspecified: Secondary | ICD-10-CM | POA: Insufficient documentation

## 2016-10-23 DIAGNOSIS — Z79899 Other long term (current) drug therapy: Secondary | ICD-10-CM | POA: Insufficient documentation

## 2016-10-23 DIAGNOSIS — R079 Chest pain, unspecified: Secondary | ICD-10-CM | POA: Diagnosis not present

## 2016-10-23 DIAGNOSIS — R091 Pleurisy: Secondary | ICD-10-CM | POA: Diagnosis not present

## 2016-10-23 DIAGNOSIS — I110 Hypertensive heart disease with heart failure: Secondary | ICD-10-CM | POA: Diagnosis not present

## 2016-10-23 DIAGNOSIS — Z7984 Long term (current) use of oral hypoglycemic drugs: Secondary | ICD-10-CM | POA: Insufficient documentation

## 2016-10-23 DIAGNOSIS — Z7982 Long term (current) use of aspirin: Secondary | ICD-10-CM | POA: Diagnosis not present

## 2016-10-23 LAB — CBC
HCT: 37.4 % — ABNORMAL LOW (ref 40.0–52.0)
HEMOGLOBIN: 13.4 g/dL (ref 13.0–18.0)
MCH: 29.6 pg (ref 26.0–34.0)
MCHC: 35.8 g/dL (ref 32.0–36.0)
MCV: 82.6 fL (ref 80.0–100.0)
Platelets: 226 10*3/uL (ref 150–440)
RBC: 4.53 MIL/uL (ref 4.40–5.90)
RDW: 13.9 % (ref 11.5–14.5)
WBC: 6.9 10*3/uL (ref 3.8–10.6)

## 2016-10-23 LAB — BASIC METABOLIC PANEL
ANION GAP: 10 (ref 5–15)
BUN: 10 mg/dL (ref 6–20)
CALCIUM: 9.1 mg/dL (ref 8.9–10.3)
CO2: 23 mmol/L (ref 22–32)
Chloride: 106 mmol/L (ref 101–111)
Creatinine, Ser: 1.14 mg/dL (ref 0.61–1.24)
GFR calc Af Amer: >60 mL/min (ref >60)
GLUCOSE: 194 mg/dL — AB (ref 65–99)
Potassium: 3.5 mmol/L (ref 3.5–5.1)
SODIUM: 139 mmol/L (ref 135–145)

## 2016-10-23 LAB — TROPONIN I

## 2016-10-23 NOTE — ED Notes (Signed)
Dr. Dahlia Client notified of pt's declination of additional troponin.

## 2016-10-23 NOTE — ED Triage Notes (Signed)
Pt c/o left sided chest pain x4 weeks, worsening with deep breathing. Pt denies N/V. Pt everyday smoker. No meds taken prior to visit.

## 2016-10-23 NOTE — ED Notes (Signed)
Pt declines additional troponin. Explanation of what troponin is and why md would like another provided to pt who declines repeat troponin.

## 2016-10-24 ENCOUNTER — Emergency Department: Payer: BLUE CROSS/BLUE SHIELD

## 2016-10-24 DIAGNOSIS — R079 Chest pain, unspecified: Secondary | ICD-10-CM | POA: Diagnosis not present

## 2016-10-24 MED ORDER — ETODOLAC 200 MG PO CAPS: 200.0000 mg | ORAL_CAPSULE | Freq: Three times a day (TID) | ORAL | 0 refills | Status: DC

## 2016-10-24 MED ORDER — MORPHINE SULFATE (PF) 4 MG/ML IV SOLN
4.0000 mg | Freq: Once | INTRAVENOUS | Status: AC
  Administered 2016-10-24: 4 mg via INTRAVENOUS
  Filled 2016-10-24: qty 1

## 2016-10-24 MED ORDER — IOPAMIDOL (ISOVUE-370) INJECTION 76%
75.0000 mL | Freq: Once | INTRAVENOUS | Status: AC | PRN
  Administered 2016-10-24: 75 mL via INTRAVENOUS

## 2016-10-24 MED ORDER — ONDANSETRON HCL 4 MG/2ML IJ SOLN
4.0000 mg | Freq: Once | INTRAMUSCULAR | Status: AC
  Administered 2016-10-24: 4 mg via INTRAVENOUS
  Filled 2016-10-24: qty 2

## 2016-10-24 NOTE — ED Notes (Signed)
Patient transported to CT 

## 2016-10-24 NOTE — ED Notes (Signed)
Pt's spouse very concerned regarding wait time. Explanation provided again for delay for md.

## 2016-10-24 NOTE — ED Provider Notes (Signed)
[facility] Emergency Department Provider Note   ____________________________________________   First MD Initiated Contact with Patient 10/23/16 2343     (approximate)  I have reviewed the triage vital signs and the nursing notes.   HISTORY  Chief Complaint Chest Pain    HPI Jacob Baker is a 46 y.o. male who runs into the hospital today with some pain in left side of his chest. Patient states it hurts to breathe in and move around. He reports that this pain started about a month ago. He hasn't done anything for the pain. He denies any shortness of breath, dizziness or lightheadedness. His pain is currently a 7 out of 10 in intensity. The patient denies any nausea or vomiting his never had this before. He was seen at another hospital within the past week and they did a lot of blood work and told him that he should follow-up with his doctor if they could not find anything as the cause of his pain. The patient states that he doesn't think it has anything to do with his heart but he can't be seen by his doctor until October 29. The patient states that the pain is sharp in quality. He is here today for evaluation.   Past Medical History:  Diagnosis Date  . CHF (congestive heart failure) ([place])   . Diabetes mellitus without complication ([place])   . GERD (gastroesophageal reflux disease)   . History of migraine    none in over 2 yrs  . Hyperlipidemia   . Hypertension   . Syncope and collapse    x1 - approx 2-3 yrs ago - dehydration    Patient Active Problem List   Diagnosis Date Noted  . Epidermal inclusion cyst 06/13/2016  . Fatigue 06/13/2016  . Chronic pain of right hip 10/15/2015  . Neuropathy 07/24/2015  . Essential hypertension 07/24/2015  . Hypertriglyceridemia 07/24/2015  . Problems with swallowing and mastication   . Stricture and stenosis of esophagus   . Encounter for preventive health examination 10/26/2014  . Pituitary insufficiency ([place])  09/12/2014  . Low testosterone 09/11/2014  . Onychomycosis 04/18/2014  . Diabetes mellitus without complication ([place]) 40/97/3532  . Tobacco abuse disorder 11/24/2013  . Hiatal hernia 11/15/2013  . Tubular adenoma of colon 11/15/2013  . History of Helicobacter pylori infection 10/18/2013  . Gastritis due to nonsteroidal anti-inflammatory drug (NSAID) 10/14/2013  . Anxiety 04/26/2013  . Chest pain 10/12/2011  . Back ache 09/28/2011    Past Surgical History:  Procedure Laterality Date  . COLONOSCOPY    . ESOPHAGEAL DILATION  09/24/2016   Procedure: ESOPHAGEAL DILATION;  Surgeon: Lucilla Lame, MD;  Location: [facility];  Service: Gastroenterology;;  . ESOPHAGOGASTRODUODENOSCOPY (EGD) WITH PROPOFOL N/A 04/08/2015   Procedure: ESOPHAGOGASTRODUODENOSCOPY (EGD) WITH PROPOFOL with dialation;  Surgeon: Lucilla Lame, MD;  Location: [facility];  Service: Endoscopy;  Laterality: N/A;  diabetic - oral meds  . ESOPHAGOGASTRODUODENOSCOPY (EGD) WITH PROPOFOL N/A 09/24/2016   Procedure: ESOPHAGOGASTRODUODENOSCOPY (EGD) WITH PROPOFOL WITH DILATION;  Surgeon: Lucilla Lame, MD;  Location: [facility];  Service: Gastroenterology;  Laterality: N/A;  Diabetic - oral meds  . FINGER AMPUTATION  2009   partial removal left index finger     Prior to Admission medications   Medication Sig Start Date End Date Taking? Authorizing Provider  amLODipine (NORVASC) 10 MG tablet TAKE 1 TABLET(10 MG) BY MOUTH DAILY 09/09/16   [name], MD  aspirin 81 MG tablet Take 81 mg by  mouth daily.    [provider]  buPROPion (WELLBUTRIN SR) 150 MG 12 hr tablet Take 200 mg by mouth 2 (two) times daily.    [provider]  clomiPHENE (CLOMID) 50 MG tablet 1/4 tab daily Patient taking differently: Take 12.5 mg by mouth daily. 1/4 tab daily 10/18/15   Renato Shin, MD  cyanocobalamin 1000 MCG tablet Take 100 mcg by mouth daily.    [provider]  EQ NICOTINE 14 MG/24HR patch  APPLY ONE PATCH TOPICALLY ONCE DAILY (REPLACES PREVIOUSLY SENT 21 MCG DOSE) 06/14/15   [name], MD  etodolac (LODINE) 200 MG capsule Take 1 capsule (200 mg total) by mouth every 8 (eight) hours. 10/24/16   Loney Hering, MD  glucose blood [facility] VERIO) test strip Test sugars twice daily 04/26/14   Phadke, Radhika P, MD  lansoprazole (PREVACID) 15 MG capsule Take 15 mg by mouth daily.    [provider]  metFORMIN (GLUCOPHAGE) 500 MG tablet TAKE ONE TABLET BY MOUTH TWICE DAILY WITH A MEAL 01/08/16   Renato Shin, MD  [facility] DELICA LANCETS 10X MISC Test sugars twice daily 04/26/14   Phadke, Karsten Ro, MD  sildenafil (VIAGRA) 100 MG tablet Take 1 tablet (100 mg total) by mouth daily as needed for erectile dysfunction. Patient not taking: Reported on 10/17/2016 09/15/16   Renato Shin, MD    Allergies Patient has no known allergies.  Family History  Problem Relation Age of Onset  . Heart disease Father        CAD  . Hypertension Father   . Diabetes Father   . Hypertension Sister   . Hypertension Brother   . Hypertension Brother   . Hypertension Mother     Social History Social History  Substance Use Topics  . Smoking status: Current Every Day Smoker    Packs/day: 2.00    Years: 27.00    Types: Cigarettes  . Smokeless tobacco: Never Used  . Alcohol use No    Review of Systems  Constitutional: No fever/chills Eyes: No visual changes. ENT: No sore throat. Cardiovascular:  chest pain. Respiratory: Denies shortness of breath. Gastrointestinal: No abdominal pain.  No nausea, no vomiting.  No diarrhea.  No constipation. Genitourinary: Negative for dysuria. Musculoskeletal: Negative for back pain. Skin: Negative for rash. Neurological: Negative for headaches, focal weakness or numbness.   ____________________________________________   PHYSICAL EXAM:  VITAL SIGNS: ED Triage Vitals  Enc Vitals Group     BP 10/23/16 1953 (!) 147/91     Pulse Rate  10/23/16 1953 83     Resp 10/23/16 1953 16     Temp 10/23/16 1953 98 F (36.7 C)     Temp Source 10/23/16 1953 Oral     SpO2 10/23/16 1953 100 %     Weight 10/23/16 1953 201 lb (91.2 kg)     Height --      Head Circumference --      Peak Flow --      Pain Score 10/24/16 0054 7     Pain Loc --      Pain Edu? --      Excl. in [place]? --     Constitutional: Alert and oriented. Well appearing and in mild distress. Eyes: Conjunctivae are normal. PERRL. EOMI. Head: Atraumatic. Nose: No congestion/rhinnorhea. Mouth/Throat: Mucous membranes are moist.  Oropharynx non-erythematous. Cardiovascular: Normal rate, regular rhythm. Grossly normal heart sounds.  Good peripheral circulation. Respiratory: Normal respiratory effort.  No retractions. Lungs CTAB. Gastrointestinal: Soft  and nontender. No distention. positive bowel sounds Musculoskeletal: No lower extremity tenderness nor edema.   Neurologic:  Normal speech and language.  Skin:  Skin is warm, dry and intact.  Psychiatric: Mood and affect are normal.   ____________________________________________   LABS (all labs ordered are listed, but only abnormal results are displayed)  Labs Reviewed  BASIC METABOLIC PANEL - Abnormal; Notable for the following:       Result Value   Glucose, Bld 194 (*)    All other components within normal limits  CBC - Abnormal; Notable for the following:    HCT 37.4 (*)    All other components within normal limits  TROPONIN I   ____________________________________________  EKG  ED ECG REPORT I, Loney Hering, the attending physician, personally viewed and interpreted this ECG.   Date: 10/23/2016  EKG Time: 1952  Rate: 80  Rhythm: normal sinus rhythm  Axis: normal  Intervals:none  ST&T Change: none  ____________________________________________  RADIOLOGY  Dg Chest 2 View  Result Date: 10/23/2016 CLINICAL DATA:  LEFT chest pain for 1 month, worse with deep inspiration. EXAM: CHEST  2  VIEW COMPARISON:  Chest radiograph August 16, 2016 inch chest radiograph November 23, 2012 and CT chest October 13, 2011 FINDINGS: Cardiomediastinal silhouette is normal. No pleural effusions or focal consolidations. Trachea projects midline and there is no pneumothorax. Soft tissue planes and included osseous structures are non-suspicious. Sclerotic foci projecting at the thoracic spine correspond to osteophytes on prior chest CT. IMPRESSION: Stable negative chest. Electronically Signed   By: Elon Alas M.D.   On: 10/23/2016 20:18   Ct Angio Chest Pe W And/or Wo Contrast  Result Date: 10/24/2016 CLINICAL DATA:  Left-sided chest pain. EXAM: CT ANGIOGRAPHY CHEST WITH CONTRAST TECHNIQUE: Multidetector CT imaging of the chest was performed using the standard protocol during bolus administration of intravenous contrast. Multiplanar CT image reconstructions and MIPs were obtained to evaluate the vascular anatomy. CONTRAST:  75 cc Isovue 370 COMPARISON:  Radiograph yesterday.  Chest CT 10/13/2011 FINDINGS: Cardiovascular: There are no filling defects within the pulmonary arteries to suggest pulmonary embolus. Thoracic aorta is normal in caliber without dissection. Small focal outpouching from the superior margin of the aortic arch just distal to the left subclavian origin is unchanged from prior exam. No surrounding inflammatory change. Heart is normal in size. No pericardial effusion. Mediastinum/Nodes: No mediastinal, hilar, or axillary adenopathy. The esophagus is decompressed. No thyroid nodule. Lungs/Pleura: Dependent lung base changes likely hypoventilatory atelectasis. No consolidation, pulmonary edema or pleural fluid. 5 mm nodular density in the right mid lung is perifissural, unchanged from prior exam and likely an intrapulmonary lymph node. Trachea and mainstem bronchi are patent. Upper Abdomen: No acute abnormality. Musculoskeletal: There are no acute or suspicious osseous abnormalities. Few  scattered bone islands in the thoracic spine. Review of the MIP images confirms the above findings. IMPRESSION: No pulmonary embolus or acute intrathoracic abnormality. Electronically Signed   By: Jeb Levering M.D.   On: 10/24/2016 02:14    ____________________________________________   PROCEDURES  Procedure(s) performed: None  Procedures  Critical Care performed: No  ____________________________________________   INITIAL IMPRESSION / ASSESSMENT AND PLAN / ED COURSE  Pertinent labs & imaging results that were available during my care of the patient were reviewed by me and considered in my medical decision making (see chart for details).  this is a 46 year old male who comes into the hospital today with some left-sided chest pain. The patient states that he has  some pleuritic pain. We checked a BMP CBC and a troponin. I did order a repeat troponin but the patient refused that as again he does not feel that has anything to do with his heart. The patient and his significant other states that he's had multiple types of blood work including a d-dimer that was negative. I discussed the possibility of a pulmonary embolus with the patient and they state that they do want a CT scan.  My differential diagnosis includes coronary artery disease, acute coronary syndrome, pleurisy, pneumonia, pulmonary embolus.  The patient had some blood work which was unremarkable. A chest x-ray was also negative. I did give the patient some morphine and Zofran and also sent him for a CT angiogram of his chest. The patient has no PE on the CT scan. I went to the patient that since everything else that we've evaluated is negative I feel that he may have some pleurisy. I will discharge the patient home with some anti-inflammatories and have him follow-up with his primary care physician. The patient has no further questions or concerns discharged home.      ____________________________________________   FINAL  CLINICAL IMPRESSION(S) / ED DIAGNOSES  Final diagnoses:  Chest pain, unspecified type  Pleurisy      NEW MEDICATIONS STARTED DURING THIS VISIT:  Discharge Medication List as of 10/24/2016  2:21 AM    START taking these medications   Details  etodolac (LODINE) 200 MG capsule Take 1 capsule (200 mg total) by mouth every 8 (eight) hours., Starting Sat 10/24/2016, Print         Note:  This document was prepared using Dragon voice recognition software and may include unintentional dictation errors.    Loney Hering, MD 10/24/16 0800

## 2016-10-24 NOTE — Discharge Instructions (Signed)
Please follow up with your primary care physician.

## 2016-10-24 NOTE — ED Notes (Signed)
[place] provided. Pt's spouse very concerned regarding total wait time and time in ed.

## 2016-10-26 ENCOUNTER — Ambulatory Visit (INDEPENDENT_AMBULATORY_CARE_PROVIDER_SITE_OTHER): Payer: BLUE CROSS/BLUE SHIELD | Admitting: Internal Medicine

## 2016-10-26 VITALS — BP 142/90 | HR 93 | Temp 97.9°F | Resp 20 | Wt 202.4 lb

## 2016-10-26 DIAGNOSIS — K2960 Other gastritis without bleeding: Secondary | ICD-10-CM | POA: Diagnosis not present

## 2016-10-26 DIAGNOSIS — M546 Pain in thoracic spine: Secondary | ICD-10-CM

## 2016-10-26 DIAGNOSIS — T39395A Adverse effect of other nonsteroidal anti-inflammatory drugs [NSAID], initial encounter: Secondary | ICD-10-CM

## 2016-10-26 DIAGNOSIS — E119 Type 2 diabetes mellitus without complications: Secondary | ICD-10-CM | POA: Diagnosis not present

## 2016-10-26 MED ORDER — SITAGLIPTIN PHOSPHATE 50 MG PO TABS: 50.0000 mg | ORAL_TABLET | Freq: Every day | ORAL | 1 refills | Status: DC

## 2016-10-26 MED ORDER — TRAMADOL HCL 50 MG PO TABS: 50.0000 mg | ORAL_TABLET | Freq: Three times a day (TID) | ORAL | 0 refills | Status: DC | PRN

## 2016-10-26 MED ORDER — CELECOXIB 200 MG PO CAPS: 200.0000 mg | ORAL_CAPSULE | Freq: Two times a day (BID) | ORAL | 1 refills | Status: DC

## 2016-10-26 MED ORDER — DICLOFENAC SODIUM 75 MG PO TBEC: 75.0000 mg | DELAYED_RELEASE_TABLET | Freq: Two times a day (BID) | ORAL | 2 refills | Status: DC

## 2016-10-26 NOTE — Progress Notes (Signed)
Subjective:  Patient ID: Jacob Baker, male    DOB: February 15, 1970  Age: 47 y.o. MRN: [id number]  CC: The primary encounter diagnosis was Acute left-sided thoracic back pain. Diagnoses of Gastritis due to nonsteroidal anti-inflammatory drug (NSAID) and Diabetes mellitus without complication ([place]) were also pertinent to this visit.  HPI [name] presents for ER follow up.  Patient has been evaluated in ER two times in the past week for persistent  left sided chest pain .  Reviewed ER Records and summarized below   9/22:  Presented with a one day history of  Atypical sharp left sided chest pain that radiates to back . Ruled out for AMI, sent home with dx of MSK pain.  .  9/28.  Returned with persistent pain , now reported to be one month in duration . Pleuritic,  CT angiogram was done to rule out PE.  Given etodolac.    Patient states that he is tolerating the medication but getting only transient relief.  Has palpable tenderness underneath his left scapula and long the paraspinous muscles of the left side of his thoracic spine.  denies any recent heavy lifting.  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  States that the seats of the truck are old and not supportive any more.   Prior plain films noted thoracic spine degenerative changes with bone spurring .  Issues #2) not tolerating metformin due to diarrhea.  Would like an alternative  medication to manage diabetes  Lab Results  Component Value Date   HGBA1C 6.7 10/13/2016     Outpatient Medications Prior to Visit  Medication Sig Dispense Refill  . amLODipine (NORVASC) 10 MG tablet TAKE 1 TABLET(10 MG) BY MOUTH DAILY 30 tablet 3  . aspirin 81 MG tablet Take 81 mg by mouth daily.    Marland Kitchen buPROPion (WELLBUTRIN SR) 150 MG 12 hr tablet Take 200 mg by mouth 2 (two) times daily.    . cyanocobalamin 1000 MCG tablet Take 100 mcg by mouth daily.    Noelle Penner NICOTINE 14 MG/24HR patch APPLY ONE PATCH TOPICALLY ONCE DAILY  (REPLACES PREVIOUSLY SENT 21 MCG DOSE) 28 patch 0  . etodolac (LODINE) 200 MG capsule Take 1 capsule (200 mg total) by mouth every 8 (eight) hours. 12 capsule 0  . glucose blood (ONETOUCH VERIO) test strip Test sugars twice daily 50 each 6  . lansoprazole (PREVACID) 15 MG capsule Take 15 mg by mouth daily.    Glory Rosebush DELICA LANCETS 17C MISC Test sugars twice daily 50 each 3  . sildenafil (VIAGRA) 100 MG tablet Take 1 tablet (100 mg total) by mouth daily as needed for erectile dysfunction. 30 tablet 1  . metFORMIN (GLUCOPHAGE) 500 MG tablet TAKE ONE TABLET BY MOUTH TWICE DAILY WITH A MEAL 180 tablet 3  . clomiPHENE (CLOMID) 50 MG tablet 1/4 tab daily (Patient not taking: Reported on 10/26/2016) 15 tablet 5   No facility-administered medications prior to visit.     Review of Systems;  Patient denies headache, fevers, malaise, unintentional weight loss, skin rash, eye pain, sinus congestion and sinus pain, sore throat, dysphagia,  hemoptysis , cough, dyspnea, wheezing, chest pain, palpitations, orthopnea, edema, abdominal pain, nausea, melena, diarrhea, constipation, flank pain, dysuria, hematuria, urinary  Frequency, nocturia, numbness, tingling, seizures,  Focal weakness, Loss of consciousness,  Tremor, insomnia, depression, anxiety, and suicidal ideation.      Objective:  BP (!) 142/90 (BP Location: Left Arm, Patient Position:  Sitting, Cuff Size: Normal)   Pulse 93   Temp 97.9 F (36.6 C) (Oral)   Resp 20   Wt 202 lb 6 oz (91.8 kg)   SpO2 96%   BMI 26.70 kg/m   BP Readings from Last 3 Encounters:  10/26/16 (!) 142/90  10/24/16 (!) 145/96  10/17/16 (!) 141/98    Wt Readings from Last 3 Encounters:  10/26/16 202 lb 6 oz (91.8 kg)  10/23/16 201 lb (91.2 kg)  10/17/16 201 lb (91.2 kg)    General appearance: alert, cooperative and appears stated age Ears: normal TM's and external ear canals both ears Throat: lips, mucosa, and tongue normal; teeth and gums normal Neck: no  adenopathy, no carotid bruit, supple, symmetrical, trachea midline and thyroid not enlarged, symmetric, no tenderness/mass/nodules Back: symmetric, no curvature. ROM normal. No CVA tenderness. Muscle spasm and pain with palpation of left paraspinous muscles and left latissimus dorsi muscles.  Lungs: clear to auscultation bilaterally Heart: regular rate and rhythm, S1, S2 normal, no murmur, click, rub or gallop Abdomen: soft, non-tender; bowel sounds normal; no masses,  no organomegaly Pulses: 2+ and symmetric Skin: Skin color, texture, turgor normal. No rashes or lesions Lymph nodes: Cervical, supraclavicular, and axillary nodes normal.  Lab Results  Component Value Date   HGBA1C 6.7 10/13/2016   HGBA1C 6.2 06/11/2016   HGBA1C 6.6 (H) 10/14/2015    Lab Results  Component Value Date   CREATININE 1.14 10/23/2016   CREATININE 1.11 10/17/2016   CREATININE 1.04 06/11/2016    Lab Results  Component Value Date   WBC 6.9 10/23/2016   HGB 13.4 10/23/2016   HCT 37.4 (L) 10/23/2016   PLT 226 10/23/2016   GLUCOSE 194 (H) 10/23/2016   CHOL 209 (H) 06/11/2016   TRIG 250.0 (H) 06/11/2016   HDL 24.90 (L) 06/11/2016   LDLDIRECT 142.0 06/11/2016   LDLCALC 115 (H) 03/23/2013   ALT 35 10/17/2016   AST 26 10/17/2016   NA 139 10/23/2016   K 3.5 10/23/2016   CL 106 10/23/2016   CREATININE 1.14 10/23/2016   BUN 10 10/23/2016   CO2 23 10/23/2016   TSH 1.07 06/11/2016   PSA 0.87 10/13/2016   INR 0.96 10/13/2011   HGBA1C 6.7 10/13/2016   MICROALBUR 3.2 (H) 10/14/2015    Dg Chest 2 View  Result Date: 10/23/2016 CLINICAL DATA:  LEFT chest pain for 1 month, worse with deep inspiration. EXAM: CHEST  2 VIEW COMPARISON:  Chest radiograph August 16, 2016 inch chest radiograph November 23, 2012 and CT chest October 13, 2011 FINDINGS: Cardiomediastinal silhouette is normal. No pleural effusions or focal consolidations. Trachea projects midline and there is no pneumothorax. Soft tissue planes and  included osseous structures are non-suspicious. Sclerotic foci projecting at the thoracic spine correspond to osteophytes on prior chest CT. IMPRESSION: Stable negative chest. Electronically Signed   By: Elon Alas M.D.   On: 10/23/2016 20:18   Ct Angio Chest Pe W And/or Wo Contrast  Result Date: 10/24/2016 CLINICAL DATA:  Left-sided chest pain. EXAM: CT ANGIOGRAPHY CHEST WITH CONTRAST TECHNIQUE: Multidetector CT imaging of the chest was performed using the standard protocol during bolus administration of intravenous contrast. Multiplanar CT image reconstructions and MIPs were obtained to evaluate the vascular anatomy. CONTRAST:  75 cc Isovue 370 COMPARISON:  Radiograph yesterday.  Chest CT 10/13/2011 FINDINGS: Cardiovascular: There are no filling defects within the pulmonary arteries to suggest pulmonary embolus. Thoracic aorta is normal in caliber without dissection. Small focal outpouching from the superior  margin of the aortic arch just distal to the left subclavian origin is unchanged from prior exam. No surrounding inflammatory change. Heart is normal in size. No pericardial effusion. Mediastinum/Nodes: No mediastinal, hilar, or axillary adenopathy. The esophagus is decompressed. No thyroid nodule. Lungs/Pleura: Dependent lung base changes likely hypoventilatory atelectasis. No consolidation, pulmonary edema or pleural fluid. 5 mm nodular density in the right mid lung is perifissural, unchanged from prior exam and likely an intrapulmonary lymph node. Trachea and mainstem bronchi are patent. Upper Abdomen: No acute abnormality. Musculoskeletal: There are no acute or suspicious osseous abnormalities. Few scattered bone islands in the thoracic spine. Review of the MIP images confirms the above findings. IMPRESSION: No pulmonary embolus or acute intrathoracic abnormality. Electronically Signed   By: Jeb Levering M.D.   On: 10/24/2016 02:14    Assessment & Plan:   Problem List Items Addressed  This Visit    Back ache - Primary    Current symptoms on left thorax appear to be muscle spasm which may be dur to alignment.  Thoracic spurring noted on chest x ray,  Repeat thoracic spine films.       Relevant Medications   traMADol (ULTRAM) 50 MG tablet   diclofenac (VOLTAREN) 75 MG EC tablet   Other Relevant Orders   DG Thoracic Spine W/Swimmers   Diabetes mellitus without complication ([place])    Not tolerating metformin  DM under control,  Change medication to Januvia       Relevant Medications   sitaGLIPtin (JANUVIA) 50 MG tablet   Gastritis due to nonsteroidal anti-inflammatory drug (NSAID)    Current tolerating NSAID without pain,  Continue PPI         I have discontinued Mr. Banks metFORMIN and celecoxib. I am also having him start on traMADol, sitaGLIPtin, and diclofenac. Additionally, I am having him maintain his aspirin, buPROPion, ONETOUCH DELICA LANCETS 29B, glucose blood, cyanocobalamin, EQ NICOTINE, clomiPHENE, amLODipine, sildenafil, lansoprazole, and etodolac.  Meds ordered this encounter  Medications  . DISCONTD: celecoxib (CELEBREX) 200 MG capsule    Sig: Take 1 capsule (200 mg total) by mouth 2 (two) times daily.    Dispense:  60 capsule    Refill:  1  . traMADol (ULTRAM) 50 MG tablet    Sig: Take 1 tablet (50 mg total) by mouth every 8 (eight) hours as needed.    Dispense:  30 tablet    Refill:  0  . sitaGLIPtin (JANUVIA) 50 MG tablet    Sig: Take 1 tablet (50 mg total) by mouth daily.    Dispense:  30 tablet    Refill:  1  . diclofenac (VOLTAREN) 75 MG EC tablet    Sig: Take 1 tablet (75 mg total) by mouth 2 (two) times daily.    Dispense:  60 tablet    Refill:  2    Medications Discontinued During This Encounter  Medication Reason  . celecoxib (CELEBREX) 200 MG capsule   . metFORMIN (GLUCOPHAGE) 500 MG tablet     Follow-up: No Follow-up on file.   [name], MD

## 2016-10-26 NOTE — Patient Instructions (Addendum)
Stop the etodolac when you get the diclofenac  (generic Voltaren ) filled .   Take it twice daily with food.   Add tylenol  Up to 2000 mg daily in divided doses (1000 mg twice daily ) .   Add tramadol  for severe pain  When you are HOME.   you need to get  Plain x rays of your thoracic spine  before we get an MRI  Of your spine    WE are switching your metformin to Januvia.  This change should clear up the loose stools.

## 2016-10-27 ENCOUNTER — Encounter: Payer: Self-pay | Admitting: Internal Medicine

## 2016-10-27 ENCOUNTER — Ambulatory Visit (INDEPENDENT_AMBULATORY_CARE_PROVIDER_SITE_OTHER): Payer: BLUE CROSS/BLUE SHIELD

## 2016-10-27 DIAGNOSIS — M546 Pain in thoracic spine: Secondary | ICD-10-CM | POA: Diagnosis not present

## 2016-10-27 DIAGNOSIS — M549 Dorsalgia, unspecified: Secondary | ICD-10-CM | POA: Diagnosis not present

## 2016-10-27 NOTE — Assessment & Plan Note (Signed)
Current tolerating NSAID without pain,  Continue PPI

## 2016-10-27 NOTE — Assessment & Plan Note (Signed)
Not tolerating metformin  DM under control,  Change medication to Januvia

## 2016-10-27 NOTE — Assessment & Plan Note (Signed)
Current symptoms on left thorax appear to be muscle spasm which may be dur to alignment.  Thoracic spurring noted on chest x ray,  Repeat thoracic spine films.

## 2016-11-23 ENCOUNTER — Ambulatory Visit (INDEPENDENT_AMBULATORY_CARE_PROVIDER_SITE_OTHER): Payer: BLUE CROSS/BLUE SHIELD | Admitting: Internal Medicine

## 2016-11-23 ENCOUNTER — Encounter: Payer: Self-pay | Admitting: Internal Medicine

## 2016-11-23 DIAGNOSIS — E781 Pure hyperglyceridemia: Secondary | ICD-10-CM | POA: Diagnosis not present

## 2016-11-23 DIAGNOSIS — M546 Pain in thoracic spine: Secondary | ICD-10-CM

## 2016-11-23 DIAGNOSIS — E119 Type 2 diabetes mellitus without complications: Secondary | ICD-10-CM | POA: Diagnosis not present

## 2016-11-23 DIAGNOSIS — G8929 Other chronic pain: Secondary | ICD-10-CM | POA: Diagnosis not present

## 2016-11-23 DIAGNOSIS — Z720 Tobacco use: Secondary | ICD-10-CM | POA: Diagnosis not present

## 2016-11-23 MED ORDER — TRAMADOL HCL 50 MG PO TABS: 50.0000 mg | ORAL_TABLET | Freq: Three times a day (TID) | ORAL | 1 refills | Status: DC | PRN

## 2016-11-23 MED ORDER — DICLOFENAC SODIUM 75 MG PO TBEC: 75.0000 mg | DELAYED_RELEASE_TABLET | Freq: Two times a day (BID) | ORAL | 2 refills | Status: DC

## 2016-11-23 MED ORDER — SITAGLIPTIN PHOSPHATE 50 MG PO TABS: 50.0000 mg | ORAL_TABLET | Freq: Every day | ORAL | 1 refills | Status: DC

## 2016-11-23 NOTE — Patient Instructions (Addendum)
Try adding Salon Pas patch with lidocaine    Continue diclofenac  Twice daily    Suspend the tylenno for a week,  If you add back,  No more than  2000  mg daily     Tramadol is a pain relieve you can take every 6 hours if needed .    Physical therapy referral in progress for your back .     You do have Type 2 diabetes,  The [place] and a low glycemic index diet are all you need to manage it right now.    Please quit smoking  There are low carb "on the go" breakfast drinks and bars:  1) try the premixed protein drinks (Atkins, AdvantEdge and the best tasting , highest protein one available is called " Premier Protein"; it is advocated by the bariatric surgeons for their patients and  available of < $2 serving at [facility] and  In bulk for $1.50/serving at [employer] and [employer]  .    Nutritional analysis :  160 cal  30 g protein  1 g sugar 50% calcium needs   Vladimir Faster and [employer]   2) There are plenty of high protein  HIGH carb cookies,  But only the following are low carb."  Look for them in the diet section of most grocery stores or vitamin shops,  where the protein shakes are  sold.   All of these have 5 g sugar varieties if you read the label  : Power crunch Atkins bars KIND : make sure you find the  "low glycemic index"  variety QUEST : (taste better after being microwaved for 5 sec OUT OF THE WRAPPER)     To make a low ca We have substitutions for your potatoes!!  Try the mashed cauliflower and riced cauliflower dishes instead of rice and mashed potatoes  Mashed turnips are also very low carb!   For desserts :  Try the Dannon Lt n Fit greek yogurt dessert flavors and top with reddi Whip .  8 carbs,  80 calories  Try Oikos Triple Zero [place] Yogurt in the salted caramel, and the coffee flavors  With Whipped Cream for dessert  breyer's low carb ice cream, available in bars (on a stick, better ) or scoopable ice cream  HERE ARE SOME OF THE REALLY  LOW CARB  BREAD CHOICES

## 2016-11-23 NOTE — Progress Notes (Signed)
Subjective:  Patient ID: Jacob Baker, male    DOB: 1970/03/14  Age: 46 y.o. MRN: [id number]  CC: Diagnoses of Hypertriglyceridemia, Chronic thoracic back pain, unspecified back pain laterality, Tobacco abuse disorder, and Diabetes mellitus without complication ([place]) were pertinent to this visit.  HPI Jacob Baker presents for follow up on persistent thoracic  back pain     .thoracic left sided chest pain, PG and low testosterone   Chest pain: negative chest CT in September .  Plain films of thoracic spine showed minimal degenerative changes .  His pain has now moved form the right side to the left.  Lives in [place],  Works in [place]. .  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  Repeatedly lifts up a 20 lb chute from waist height to over his shoulder.     IPG:  Diagnosis of diabetes discussed;  Seems shocked that he has diabetes despite prior discussion and use of januvia, which he is tolerating after suspending metformin   Hypertension : ..Hypertension: patient checks blood pressure twice weekly at home.  Readings have been for the most part < 140/80 at rest . Patient is following a reduce salt diet most days and is taking medications as prescribed    Lab Results  Component Value Date   HGBA1C 6.7 10/13/2016     Outpatient Medications Prior to Visit  Medication Sig Dispense Refill  . amLODipine (NORVASC) 10 MG tablet TAKE 1 TABLET(10 MG) BY MOUTH DAILY 30 tablet 3  . aspirin 81 MG tablet Take 81 mg by mouth daily.    Marland Kitchen buPROPion (WELLBUTRIN SR) 150 MG 12 hr tablet Take 200 mg by mouth 2 (two) times daily.    . cyanocobalamin 1000 MCG tablet Take 100 mcg by mouth daily.    Marland Kitchen glucose blood (ONETOUCH VERIO) test strip Test sugars twice daily 50 each 6  . lansoprazole (PREVACID) 15 MG capsule Take 15 mg by mouth daily.    Glory Rosebush DELICA LANCETS 78G MISC Test sugars twice daily 50  each 3  . sildenafil (VIAGRA) 100 MG tablet Take 1 tablet (100 mg total) by mouth daily as needed for erectile dysfunction. 30 tablet 1  . etodolac (LODINE) 200 MG capsule Take 1 capsule (200 mg total) by mouth every 8 (eight) hours. 12 capsule 0  . sitaGLIPtin (JANUVIA) 50 MG tablet Take 1 tablet (50 mg total) by mouth daily. 30 tablet 1  . clomiPHENE (CLOMID) 50 MG tablet 1/4 tab daily (Patient not taking: Reported on 10/26/2016) 15 tablet 5  . diclofenac (VOLTAREN) 75 MG EC tablet Take 1 tablet (75 mg total) by mouth 2 (two) times daily. (Patient not taking: Reported on 11/23/2016) 60 tablet 2  . EQ NICOTINE 14 MG/24HR patch APPLY ONE PATCH TOPICALLY ONCE DAILY (REPLACES PREVIOUSLY SENT 21 MCG DOSE) (Patient not taking: Reported on 11/23/2016) 28 patch 0  . traMADol (ULTRAM) 50 MG tablet Take 1 tablet (50 mg total) by mouth every 8 (eight) hours as needed. (Patient not taking: Reported on 11/23/2016) 30 tablet 0   No facility-administered medications prior to visit.     Review of Systems;  Patient denies headache, fevers, malaise, unintentional weight loss, skin rash, eye pain, sinus congestion and sinus pain, sore throat, dysphagia,  hemoptysis , cough, dyspnea, wheezing, chest pain, palpitations, orthopnea, edema, abdominal pain, nausea, melena, diarrhea, constipation, flank pain, dysuria, hematuria, urinary  Frequency,  nocturia, numbness, tingling, seizures,  Focal weakness, Loss of consciousness,  Tremor, insomnia, depression, anxiety, and suicidal ideation.      Objective:  BP (!) 142/90 (BP Location: Left Arm, Patient Position: Sitting, Cuff Size: Normal)   Pulse 90   Temp 98.7 F (37.1 C) (Oral)   Resp 15   Ht 6\' 1"  (1.854 m)   Wt 204 lb 3.2 oz (92.6 kg)   SpO2 97%   BMI 26.94 kg/m   BP Readings from Last 3 Encounters:  11/23/16 (!) 142/90  10/26/16 (!) 142/90  10/24/16 (!) 145/96    Wt Readings from Last 3 Encounters:  11/23/16 204 lb 3.2 oz (92.6 kg)  10/26/16 202 lb  6 oz (91.8 kg)  10/23/16 201 lb (91.2 kg)    General appearance: alert, cooperative and appears stated age Ears: normal TM's and external ear canals both ears Throat: lips, mucosa, and tongue normal; teeth and gums normal Neck: no adenopathy, no carotid bruit, supple, symmetrical, trachea midline and thyroid not enlarged, symmetric, no tenderness/mass/nodules Back: symmetric, no curvature. ROM normal. No CVA tenderness. Lungs: clear to auscultation bilaterally Heart: regular rate and rhythm, S1, S2 normal, no murmur, click, rub or gallop Abdomen: soft, non-tender; bowel sounds normal; no masses,  no organomegaly Pulses: 2+ and symmetric Skin: Skin color, texture, turgor normal. No rashes or lesions Lymph nodes: Cervical, supraclavicular, and axillary nodes normal.  Lab Results  Component Value Date   HGBA1C 6.7 10/13/2016   HGBA1C 6.2 06/11/2016   HGBA1C 6.6 (H) 10/14/2015    Lab Results  Component Value Date   CREATININE 1.14 10/23/2016   CREATININE 1.11 10/17/2016   CREATININE 1.04 06/11/2016    Lab Results  Component Value Date   WBC 6.9 10/23/2016   HGB 13.4 10/23/2016   HCT 37.4 (L) 10/23/2016   PLT 226 10/23/2016   GLUCOSE 194 (H) 10/23/2016   CHOL 209 (H) 06/11/2016   TRIG 250.0 (H) 06/11/2016   HDL 24.90 (L) 06/11/2016   LDLDIRECT 142.0 06/11/2016   LDLCALC 115 (H) 03/23/2013   ALT 35 10/17/2016   AST 26 10/17/2016   NA 139 10/23/2016   K 3.5 10/23/2016   CL 106 10/23/2016   CREATININE 1.14 10/23/2016   BUN 10 10/23/2016   CO2 23 10/23/2016   TSH 1.07 06/11/2016   PSA 0.87 10/13/2016   INR 0.96 10/13/2011   HGBA1C 6.7 10/13/2016   MICROALBUR 3.2 (H) 10/14/2015    Dg Chest 2 View  Result Date: 10/23/2016 CLINICAL DATA:  LEFT chest pain for 1 month, worse with deep inspiration. EXAM: CHEST  2 VIEW COMPARISON:  Chest radiograph August 16, 2016 inch chest radiograph November 23, 2012 and CT chest October 13, 2011 FINDINGS: Cardiomediastinal silhouette is  normal. No pleural effusions or focal consolidations. Trachea projects midline and there is no pneumothorax. Soft tissue planes and included osseous structures are non-suspicious. Sclerotic foci projecting at the thoracic spine correspond to osteophytes on prior chest CT. IMPRESSION: Stable negative chest. Electronically Signed   By: Elon Alas M.D.   On: 10/23/2016 20:18   Ct Angio Chest Pe W And/or Wo Contrast  Result Date: 10/24/2016 CLINICAL DATA:  Left-sided chest pain. EXAM: CT ANGIOGRAPHY CHEST WITH CONTRAST TECHNIQUE: Multidetector CT imaging of the chest was performed using the standard protocol during bolus administration of intravenous contrast. Multiplanar CT image reconstructions and MIPs were obtained to evaluate the vascular anatomy. CONTRAST:  75 cc Isovue 370 COMPARISON:  Radiograph yesterday.  Chest CT 10/13/2011 FINDINGS: Cardiovascular:  There are no filling defects within the pulmonary arteries to suggest pulmonary embolus. Thoracic aorta is normal in caliber without dissection. Small focal outpouching from the superior margin of the aortic arch just distal to the left subclavian origin is unchanged from prior exam. No surrounding inflammatory change. Heart is normal in size. No pericardial effusion. Mediastinum/Nodes: No mediastinal, hilar, or axillary adenopathy. The esophagus is decompressed. No thyroid nodule. Lungs/Pleura: Dependent lung base changes likely hypoventilatory atelectasis. No consolidation, pulmonary edema or pleural fluid. 5 mm nodular density in the right mid lung is perifissural, unchanged from prior exam and likely an intrapulmonary lymph node. Trachea and mainstem bronchi are patent. Upper Abdomen: No acute abnormality. Musculoskeletal: There are no acute or suspicious osseous abnormalities. Few scattered bone islands in the thoracic spine. Review of the MIP images confirms the above findings. IMPRESSION: No pulmonary embolus or acute intrathoracic abnormality.  Electronically Signed   By: Jeb Levering M.D.   On: 10/24/2016 02:14    Assessment & Plan:   Problem List Items Addressed This Visit    Back ache    Secondary to overuse from work related activities.  PT referral   Advised to try SalonPas patches,  Tramadol and diclofenac       Relevant Medications   diclofenac (VOLTAREN) 75 MG EC tablet   traMADol (ULTRAM) 50 MG tablet   Other Relevant Orders   Ambulatory referral to Physical Therapy   Diabetes mellitus without complication ([place])    Now tolerating a change in medication to Januvia . A total of 25 minutes of face to face time was spent with patient , more than half of which was spent in counselling on the diagnosis of diabetes mellitus type 2 .  Lab Results  Component Value Date   HGBA1C 6.7 10/13/2016   Lab Results  Component Value Date   MICROALBUR 3.2 (H) 10/14/2015         Relevant Medications   sitaGLIPtin (JANUVIA) 50 MG tablet   Other Relevant Orders   Microalbumin / creatinine urine ratio   Hypertriglyceridemia    Using the Framingham risk calculator,  his 10 year risk of coronary artery disease is 32%..  Will recommend trial of crestor and ASA given elevated triglycerides and concurrent type 2 diabetes . He remains opposed to use of statin   Lab Results  Component Value Date   CHOL 209 (H) 06/11/2016   HDL 24.90 (L) 06/11/2016   LDLCALC 115 (H) 03/23/2013   LDLDIRECT 142.0 06/11/2016   TRIG 250.0 (H) 06/11/2016   CHOLHDL 8 06/11/2016         Tobacco abuse disorder    After quitting in 2017,  He has resumed smoking  Despite use of Nicoderm patches .  Urged to quit , warned of the risks of smoking and diabetes          I have discontinued Mr. Kippy Gohman NICOTINE, clomiPHENE, and etodolac. I am also having him maintain his aspirin, buPROPion, ONETOUCH DELICA LANCETS 23J, glucose blood, cyanocobalamin, amLODipine, sildenafil, lansoprazole, diclofenac, traMADol, and sitaGLIPtin.  Meds ordered this  encounter  Medications  . diclofenac (VOLTAREN) 75 MG EC tablet    Sig: Take 1 tablet (75 mg total) by mouth 2 (two) times daily.    Dispense:  60 tablet    Refill:  2  . traMADol (ULTRAM) 50 MG tablet    Sig: Take 1 tablet (50 mg total) by mouth every 8 (eight) hours as needed.    Dispense:  90  tablet    Refill:  1  . sitaGLIPtin (JANUVIA) 50 MG tablet    Sig: Take 1 tablet (50 mg total) by mouth daily.    Dispense:  90 tablet    Refill:  1    Medications Discontinued During This Encounter  Medication Reason  . clomiPHENE (CLOMID) 50 MG tablet Patient has not taken in last 30 days  . EQ NICOTINE 14 MG/24HR patch Patient has not taken in last 30 days  . diclofenac (VOLTAREN) 75 MG EC tablet Patient has not taken in last 30 days  . traMADol (ULTRAM) 50 MG tablet Patient has not taken in last 30 days  . etodolac (LODINE) 200 MG capsule   . sitaGLIPtin (JANUVIA) 50 MG tablet Reorder    Follow-up: Return in about 3 months (around 02/23/2017).   [name], MD

## 2016-11-24 NOTE — Assessment & Plan Note (Signed)
Using the Framingham risk calculator,  his 10 year risk of coronary artery disease is 32%..  Will recommend trial of crestor and ASA given elevated triglycerides and concurrent type 2 diabetes . He remains opposed to use of statin   Lab Results  Component Value Date   CHOL 209 (H) 06/11/2016   HDL 24.90 (L) 06/11/2016   LDLCALC 115 (H) 03/23/2013   LDLDIRECT 142.0 06/11/2016   TRIG 250.0 (H) 06/11/2016   CHOLHDL 8 06/11/2016

## 2016-11-24 NOTE — Assessment & Plan Note (Signed)
After quitting in 2017,  He has resumed smoking  Despite use of Nicoderm patches .  Urged to quit , warned of the risks of smoking and diabetes

## 2016-11-24 NOTE — Assessment & Plan Note (Signed)
Secondary to overuse from work related activities.  PT referral   Advised to try SalonPas patches,  Tramadol and diclofenac

## 2016-11-24 NOTE — Assessment & Plan Note (Signed)
Now tolerating a change in medication to Januvia . A total of 25 minutes of face to face time was spent with patient , more than half of which was spent in counselling on the diagnosis of diabetes mellitus type 2 .  Lab Results  Component Value Date   HGBA1C 6.7 10/13/2016   Lab Results  Component Value Date   MICROALBUR 3.2 (H) 10/14/2015

## 2016-12-16 ENCOUNTER — Other Ambulatory Visit: Payer: Self-pay

## 2016-12-16 MED ORDER — PANTOPRAZOLE SODIUM 40 MG PO TBEC: 40.0000 mg | DELAYED_RELEASE_TABLET | Freq: Every day | ORAL | 6 refills | Status: DC

## 2017-02-15 ENCOUNTER — Other Ambulatory Visit: Payer: Self-pay | Admitting: Internal Medicine

## 2017-02-15 ENCOUNTER — Encounter: Payer: Self-pay | Admitting: Internal Medicine

## 2017-02-15 MED ORDER — LEVOFLOXACIN 500 MG PO TABS: 500.0000 mg | ORAL_TABLET | Freq: Every day | ORAL | 0 refills | Status: DC

## 2017-02-15 MED ORDER — METRONIDAZOLE 500 MG PO TABS
500.0000 mg | ORAL_TABLET | Freq: Three times a day (TID) | ORAL | 0 refills | Status: DC
Start: 2017-02-15 — End: 2017-07-27

## 2017-02-22 ENCOUNTER — Other Ambulatory Visit: Payer: Self-pay | Admitting: Internal Medicine

## 2017-02-23 ENCOUNTER — Encounter: Payer: Self-pay | Admitting: Internal Medicine

## 2017-02-23 ENCOUNTER — Ambulatory Visit: Payer: BLUE CROSS/BLUE SHIELD | Admitting: Internal Medicine

## 2017-02-23 VITALS — BP 168/84 | HR 94 | Temp 98.1°F | Resp 15 | Ht 73.0 in | Wt 213.6 lb

## 2017-02-23 DIAGNOSIS — E781 Pure hyperglyceridemia: Secondary | ICD-10-CM

## 2017-02-23 DIAGNOSIS — I10 Essential (primary) hypertension: Secondary | ICD-10-CM

## 2017-02-23 DIAGNOSIS — E119 Type 2 diabetes mellitus without complications: Secondary | ICD-10-CM | POA: Diagnosis not present

## 2017-02-23 DIAGNOSIS — K0889 Other specified disorders of teeth and supporting structures: Secondary | ICD-10-CM

## 2017-02-23 MED ORDER — HYDROCODONE-ACETAMINOPHEN 10-325 MG PO TABS: 1.0000 | ORAL_TABLET | Freq: Four times a day (QID) | ORAL | 0 refills | Status: DC | PRN

## 2017-02-23 NOTE — Progress Notes (Signed)
Subjective:  Patient ID: Jacob Baker, male    DOB: [birth year]  Age: 47 y.o. MRN: [id number]  CC: The primary encounter diagnosis was Diabetes mellitus without complication ([place]). Diagnoses of Tooth pain, Essential hypertension, and Hypertriglyceridemia were also pertinent to this visit.  HPI Jacob Baker presents for follow up on type 2 DM  And worsening tooth pain.  He was treated for abscess on Jan 21 with broad spectrum antibioitics which were stopped last Thursday when dentist told him he needed 4 wisdom teeth extraction  Has been taking aspirin  and motrin for control of pain   T2DM: Patient does not check blood sugars more than once a week,  Last one was 233 fasting this morning after eating sweet n sour chicken . Does orot recall any above 200 or less than 108.   No complaints today.  Taking his medications as directed,  Not exercising on a regular basis or trying to lose weight.  Patient voices awareness  of the foods he/she needs to avoid,  And follows a low GI diet about 50% of the time.  Has not had an annual diabetic eye exam.  Denies numbness and tingling in lower extremities.  Denies hypoglycemic symptoms.   Lab Results  Component Value Date   HGBA1C 7.0 (H) 02/23/2017   Foot exam normal except fot toenails overgrown and curled,  Podiatrist managed by [facility] in [place]     Outpatient Medications Prior to Visit  Medication Sig Dispense Refill  . amLODipine (NORVASC) 10 MG tablet TAKE 1 TABLET(10 MG) BY MOUTH DAILY 30 tablet 5  . aspirin 81 MG tablet Take 81 mg by mouth daily.    Marland Kitchen buPROPion (WELLBUTRIN SR) 150 MG 12 hr tablet Take 200 mg by mouth 2 (two) times daily.    . cyanocobalamin 1000 MCG tablet Take 100 mcg by mouth daily.    . diclofenac (VOLTAREN) 75 MG EC tablet Take 1 tablet (75 mg total) by mouth 2 (two) times daily. 60 tablet 2  . glucose blood (ONETOUCH VERIO) test strip Test sugars twice daily 50 each 6  . lansoprazole (PREVACID) 15 MG capsule Take  15 mg by mouth daily.    Marland Kitchen levofloxacin (LEVAQUIN) 500 MG tablet Take 1 tablet (500 mg total) by mouth daily. 19 tablet 0  . ONETOUCH DELICA LANCETS 94W MISC Test sugars twice daily 50 each 3  . pantoprazole (PROTONIX) 40 MG tablet Take 1 tablet (40 mg total) by mouth daily. 30 tablet 6  . sitaGLIPtin (JANUVIA) 50 MG tablet Take 1 tablet (50 mg total) by mouth daily. 90 tablet 1  . metroNIDAZOLE (FLAGYL) 500 MG tablet Take 1 tablet (500 mg total) by mouth 3 (three) times daily. (Patient not taking: Reported on 02/23/2017) 30 tablet 0  . sildenafil (VIAGRA) 100 MG tablet Take 1 tablet (100 mg total) by mouth daily as needed for erectile dysfunction. (Patient not taking: Reported on 02/23/2017) 30 tablet 1  . traMADol (ULTRAM) 50 MG tablet Take 1 tablet (50 mg total) by mouth every 8 (eight) hours as needed. (Patient not taking: Reported on 02/23/2017) 90 tablet 1   No facility-administered medications prior to visit.     Review of Systems;  Patient denies headache, fevers, malaise, unintentional weight loss, skin rash, eye pain, sinus congestion and sinus pain, sore throat, dysphagia,  hemoptysis , cough, dyspnea, wheezing, chest pain, palpitations, orthopnea, edema, abdominal pain, nausea, melena, diarrhea, constipation, flank pain, dysuria, hematuria, urinary  Frequency, nocturia, numbness, tingling,  seizures,  Focal weakness, Loss of consciousness,  Tremor, insomnia, depression, anxiety, and suicidal ideation.      Objective:  BP (!) 168/84 (BP Location: Left Arm, Patient Position: Sitting, Cuff Size: Large)   Pulse 94   Temp 98.1 F (36.7 C) (Oral)   Resp 15   Ht 6\' 1"  (1.854 m)   Wt 213 lb 9.6 oz (96.9 kg)   SpO2 98%   BMI 28.18 kg/m   BP Readings from Last 3 Encounters:  02/23/17 (!) 168/84  11/23/16 (!) 142/90  10/26/16 (!) 142/90    Wt Readings from Last 3 Encounters:  02/23/17 213 lb 9.6 oz (96.9 kg)  11/23/16 204 lb 3.2 oz (92.6 kg)  10/26/16 202 lb 6 oz (91.8 kg)     General appearance: alert, cooperative and appears stated age Ears: normal TM's and external ear canals both ears Throat: lips, mucosa, and tongue normal; teeth and gums normal Neck: no adenopathy, no carotid bruit, supple, symmetrical, trachea midline and thyroid not enlarged, symmetric, no tenderness/mass/nodules Back: symmetric, no curvature. ROM normal. No CVA tenderness. Lungs: clear to auscultation bilaterally Heart: regular rate and rhythm, S1, S2 normal, no murmur, click, rub or gallop Abdomen: soft, non-tender; bowel sounds normal; no masses,  no organomegaly Pulses: 2+ and symmetric Skin: Skin color, texture, turgor normal. No rashes or lesions Lymph nodes: Cervical, supraclavicular, and axillary nodes normal.  Lab Results  Component Value Date   HGBA1C 7.0 (H) 02/23/2017   HGBA1C 6.7 10/13/2016   HGBA1C 6.2 06/11/2016    Lab Results  Component Value Date   CREATININE 1.18 02/23/2017   CREATININE 1.14 10/23/2016   CREATININE 1.11 10/17/2016    Lab Results  Component Value Date   WBC 7.6 02/23/2017   HGB 13.7 02/23/2017   HCT 38.7 (L) 02/23/2017   PLT 303.0 02/23/2017   GLUCOSE 166 (H) 02/23/2017   CHOL 209 (H) 06/11/2016   TRIG 250.0 (H) 06/11/2016   HDL 24.90 (L) 06/11/2016   LDLDIRECT 142.0 06/11/2016   LDLCALC 115 (H) 03/23/2013   ALT 55 (H) 02/23/2017   AST 25 02/23/2017   NA 139 02/23/2017   K 3.8 02/23/2017   CL 104 02/23/2017   CREATININE 1.18 02/23/2017   BUN 10 02/23/2017   CO2 25 02/23/2017   TSH 1.07 06/11/2016   PSA 0.87 10/13/2016   INR 0.96 10/13/2011   HGBA1C 7.0 (H) 02/23/2017   MICROALBUR 2.8 (H) 02/23/2017     Assessment & Plan:   Problem List Items Addressed This Visit    Diabetes mellitus without complication ([place]) - Primary    Well controlled on Januvia . Has has new onset microscopic proteinuria. Adding losartan.  Lab Results  Component Value Date   HGBA1C 7.0 (H) 02/23/2017   Lab Results  Component Value Date    MICROALBUR 2.8 (H) 02/23/2017         Relevant Orders   Hemoglobin A1c (Completed)   Microalbumin / creatinine urine ratio (Completed)   Comprehensive metabolic panel (Completed)   Essential hypertension    Not at goal on amlodipine alone  Due to pain and use of NSAIDs.  Stopping nsaid use and treating pain.       Hypertriglyceridemia    Using the Framingham risk calculator,  his 10 year risk of coronary artery disease is 32%.. REcommended  trial of crestor last year but he deferred.  Will return for fasting lipids  Lab Results  Component Value Date   CHOL 209 (H) 06/11/2016  HDL 24.90 (L) 06/11/2016   LDLCALC 115 (H) 03/23/2013   LDLDIRECT 142.0 06/11/2016   TRIG 250.0 (H) 06/11/2016   CHOLHDL 8 06/11/2016          Other Visit Diagnoses    Tooth pain       Relevant Orders   CBC with Differential/Platelet (Completed)    A total of 25 minutes of face to face time was spent with patient more than half of which was spent in counselling about the above mentioned conditions  and coordination of care   I have discontinued Shanon Brow A. Sundby's HYDROcodone-acetaminophen. I am also having him maintain his aspirin, buPROPion, ONETOUCH DELICA LANCETS 66A, glucose blood, cyanocobalamin, sildenafil, lansoprazole, diclofenac, traMADol, sitaGLIPtin, pantoprazole, levofloxacin, metroNIDAZOLE, and amLODipine.  Meds ordered this encounter  Medications  . DISCONTD: HYDROcodone-acetaminophen (NORCO) 10-325 MG tablet    Sig: Take 1 tablet by mouth every 6 (six) hours as needed.    Dispense:  30 tablet    Refill:  0    Medications Discontinued During This Encounter  Medication Reason  . HYDROcodone-acetaminophen (NORCO) 10-325 MG tablet     Follow-up: Return in about 3 months (around 05/24/2017) for follow up diabetes.   [name], MD

## 2017-02-23 NOTE — Patient Instructions (Signed)
Stop the aspirin and ibuprofen.  They are thinning your blood (not good for oral surgery) and raising your blood pressure  Use the hydrocodone/acetominophen and tylenol (tylenol is the name brand for plain acetominophen)  for pain control.  You can use 2000 mg of acetominophen daily SAFELY .      Check blood pressure  in 2 days and if  It is still > 140/80  ,  I will need to add something to the amlodipine to lower it  Before your oral surgery

## 2017-02-24 DIAGNOSIS — H40033 Anatomical narrow angle, bilateral: Secondary | ICD-10-CM | POA: Diagnosis not present

## 2017-02-24 DIAGNOSIS — E119 Type 2 diabetes mellitus without complications: Secondary | ICD-10-CM | POA: Diagnosis not present

## 2017-02-24 LAB — COMPREHENSIVE METABOLIC PANEL
ALBUMIN: 4.8 g/dL (ref 3.5–5.2)
ALT: 55 U/L — AB (ref 0–53)
AST: 25 U/L (ref 0–37)
Alkaline Phosphatase: 112 U/L (ref 39–117)
BILIRUBIN TOTAL: 0.6 mg/dL (ref 0.2–1.2)
BUN: 10 mg/dL (ref 6–23)
CALCIUM: 9.4 mg/dL (ref 8.4–10.5)
CO2: 25 mEq/L (ref 19–32)
Chloride: 104 mEq/L (ref 96–112)
Creatinine, Ser: 1.18 mg/dL (ref 0.40–1.50)
GFR: 84.44 mL/min (ref >60.00)
Glucose, Bld: 166 mg/dL — ABNORMAL HIGH (ref 70–99)
Potassium: 3.8 mEq/L (ref 3.5–5.1)
Sodium: 139 mEq/L (ref 135–145)
Total Protein: 7.2 g/dL (ref 6.0–8.3)

## 2017-02-24 LAB — CBC WITH DIFFERENTIAL/PLATELET
BASOS ABS: 0.1 10*3/uL (ref 0.0–0.1)
Basophils Relative: 1.1 % (ref 0.0–3.0)
EOS ABS: 0.1 10*3/uL (ref 0.0–0.7)
Eosinophils Relative: 1.5 % (ref 0.0–5.0)
HEMATOCRIT: 38.7 % — AB (ref 39.0–52.0)
HEMOGLOBIN: 13.7 g/dL (ref 13.0–17.0)
LYMPHS PCT: 24.3 % (ref 12.0–46.0)
Lymphs Abs: 1.8 10*3/uL (ref 0.7–4.0)
MCHC: 35.5 g/dL (ref 30.0–36.0)
MCV: 83.6 fl (ref 78.0–100.0)
Monocytes Absolute: 0.5 10*3/uL (ref 0.1–1.0)
Monocytes Relative: 6.6 % (ref 3.0–12.0)
NEUTROS ABS: 5 10*3/uL (ref 1.4–7.7)
Neutrophils Relative %: 66.5 % (ref 43.0–77.0)
PLATELETS: 303 10*3/uL (ref 150.0–400.0)
RBC: 4.63 Mil/uL (ref 4.22–5.81)
RDW: 13.7 % (ref 11.5–15.5)
WBC: 7.6 10*3/uL (ref 4.0–10.5)

## 2017-02-24 LAB — MICROALBUMIN / CREATININE URINE RATIO
Creatinine,U: 97.1 mg/dL
Microalb Creat Ratio: 2.9 mg/g (ref 0.0–30.0)
Microalb, Ur: 2.8 mg/dL — ABNORMAL HIGH (ref 0.0–1.9)

## 2017-02-24 LAB — HEMOGLOBIN A1C: Hgb A1c MFr Bld: 7 % — ABNORMAL HIGH (ref 4.6–6.5)

## 2017-02-24 MED ORDER — LISINOPRIL 5 MG PO TABS: 5.0000 mg | ORAL_TABLET | Freq: Every day | ORAL | 0 refills | Status: DC

## 2017-02-24 NOTE — Assessment & Plan Note (Addendum)
Not at goal on amlodipine alone  Due to pain and use of NSAIDs.  Stopping nsaid use and treating pain. Adding losartan for protinuria.   Lab Results  Component Value Date   MICROALBUR 2.8 (H) 02/23/2017   Lab Results  Component Value Date   CREATININE 1.18 02/23/2017

## 2017-02-24 NOTE — Assessment & Plan Note (Signed)
Well controlled on Januvia . Has has new onset microscopic proteinuria. Adding losartan.  Lab Results  Component Value Date   HGBA1C 7.0 (H) 02/23/2017   Lab Results  Component Value Date   MICROALBUR 2.8 (H) 02/23/2017

## 2017-02-24 NOTE — Assessment & Plan Note (Addendum)
Using the Framingham risk calculator,  his 10 year risk of coronary artery disease is 32%.. REcommended  trial of crestor last year but he deferred.  Will return for fasting lipids  Lab Results  Component Value Date   CHOL 209 (H) 06/11/2016   HDL 24.90 (L) 06/11/2016   LDLCALC 115 (H) 03/23/2013   LDLDIRECT 142.0 06/11/2016   TRIG 250.0 (H) 06/11/2016   CHOLHDL 8 06/11/2016

## 2017-03-08 ENCOUNTER — Other Ambulatory Visit: Payer: Self-pay | Admitting: Internal Medicine

## 2017-03-09 ENCOUNTER — Telehealth: Payer: Self-pay | Admitting: Internal Medicine

## 2017-03-09 DIAGNOSIS — E785 Hyperlipidemia, unspecified: Secondary | ICD-10-CM

## 2017-03-09 DIAGNOSIS — I10 Essential (primary) hypertension: Secondary | ICD-10-CM

## 2017-03-09 NOTE — Telephone Encounter (Signed)
Patient called wanting labs changed to  [facility] , labs changed as requested. FYI

## 2017-03-18 DIAGNOSIS — M79671 Pain in right foot: Secondary | ICD-10-CM | POA: Diagnosis not present

## 2017-03-18 DIAGNOSIS — M79674 Pain in right toe(s): Secondary | ICD-10-CM | POA: Diagnosis not present

## 2017-03-18 DIAGNOSIS — L03031 Cellulitis of right toe: Secondary | ICD-10-CM | POA: Diagnosis not present

## 2017-03-18 DIAGNOSIS — L600 Ingrowing nail: Secondary | ICD-10-CM | POA: Diagnosis not present

## 2017-04-26 ENCOUNTER — Encounter: Payer: Self-pay | Admitting: Internal Medicine

## 2017-04-26 DIAGNOSIS — I82409 Acute embolism and thrombosis of unspecified deep veins of unspecified lower extremity: Secondary | ICD-10-CM

## 2017-04-26 DIAGNOSIS — M25562 Pain in left knee: Secondary | ICD-10-CM

## 2017-04-26 DIAGNOSIS — M25551 Pain in right hip: Secondary | ICD-10-CM

## 2017-04-26 HISTORY — DX: Acute embolism and thrombosis of unspecified deep veins of unspecified lower extremity: I82.409

## 2017-04-28 ENCOUNTER — Ambulatory Visit: Payer: Self-pay | Admitting: *Deleted

## 2017-04-28 NOTE — Telephone Encounter (Signed)
Jacob Baker phoned with complaint of Left knee and calf have been mildly swollen for approx. 2 week. He stated there is pain in the calf. He is able to walk and work as usual. He denies any SOB and chest pain. He denies any injury or insect bite to the area. OTHER-He reported he had 3 teeth removed last month. He stated he could not leave work today so advised he seek treatment at Urgent Care after hours.  Reason for Disposition . [1] Thigh, calf, or ankle swelling AND [2] only 1 side  Answer Assessment - Initial Assessment Questions 1. ONSET: "When did the swelling start?" (e.g., minutes, hours, days)     For 2 weeks now 2. LOCATION: "What part of the leg is swollen?"  "Are both legs swollen or just one leg?"   Left knee is swollen down to the calf 3. SEVERITY: "How bad is the swelling?" (e.g., localized; mild, moderate, severe)  - Localized - small area of swelling localized to one leg  - MILD pedal edema - swelling limited to foot and ankle, pitting edema < 1/4 inch (6 mm) deep, rest and elevation eliminate most or all swelling  - MODERATE edema - swelling of lower leg to knee, pitting edema > 1/4 inch (6 mm) deep, rest and elevation only partially reduce swelling  - SEVERE edema - swelling extends above knee, facial or hand swelling present     Mild to moderate swelling of Left knee and calf.  4. REDNESS: "Does the swelling look red or infected?"     no 5. PAIN: "Is the swelling painful to touch?" If so, ask: "How painful is it?"   (Scale 1-10; mild, moderate or severe)    8 on the scale with just a touch to the calf 6. FEVER: "Do you have a fever?" If so, ask: "What is it, how was it measured, and when did it start?"   no 7. CAUSE: "What do you think is causing the leg swelling?"    unknown 8. MEDICAL HISTORY: "Do you have a history of heart failure, kidney disease, liver failure, or cancer?" no 9. RECURRENT SYMPTOM: "Have you had leg swelling before?" If so, ask: "When was the last  time?" "What happened that time?"   no 10. OTHER SYMPTOMS: "Do you have any other symptoms?" (e.g., chest pain, difficulty breathing)    no 11. PREGNANCY: "Is there any chance you are pregnant?" "When was your last menstrual period?"  na  Protocols used: LEG SWELLING AND EDEMA-A-AH

## 2017-04-29 NOTE — Addendum Note (Signed)
Addended by: [name] on: 04/29/2017 12:40 PM   Modules accepted: Orders

## 2017-05-06 ENCOUNTER — Other Ambulatory Visit: Payer: Self-pay

## 2017-05-06 ENCOUNTER — Emergency Department: Payer: BLUE CROSS/BLUE SHIELD

## 2017-05-06 ENCOUNTER — Encounter: Payer: Self-pay | Admitting: Emergency Medicine

## 2017-05-06 ENCOUNTER — Emergency Department
Admission: EM | Admit: 2017-05-06 | Discharge: 2017-05-06 | Disposition: A | Discharge status: Home / Self Care | Payer: BLUE CROSS/BLUE SHIELD | Attending: Emergency Medicine | Admitting: Emergency Medicine

## 2017-05-06 DIAGNOSIS — I82442 Acute embolism and thrombosis of left tibial vein: Secondary | ICD-10-CM | POA: Insufficient documentation

## 2017-05-06 DIAGNOSIS — F419 Anxiety disorder, unspecified: Secondary | ICD-10-CM | POA: Diagnosis not present

## 2017-05-06 DIAGNOSIS — I82402 Acute embolism and thrombosis of unspecified deep veins of left lower extremity: Secondary | ICD-10-CM | POA: Diagnosis not present

## 2017-05-06 DIAGNOSIS — M79662 Pain in left lower leg: Secondary | ICD-10-CM | POA: Diagnosis present

## 2017-05-06 DIAGNOSIS — F17210 Nicotine dependence, cigarettes, uncomplicated: Secondary | ICD-10-CM | POA: Diagnosis not present

## 2017-05-06 DIAGNOSIS — I110 Hypertensive heart disease with heart failure: Secondary | ICD-10-CM | POA: Insufficient documentation

## 2017-05-06 DIAGNOSIS — I509 Heart failure, unspecified: Secondary | ICD-10-CM | POA: Insufficient documentation

## 2017-05-06 DIAGNOSIS — I824Z2 Acute embolism and thrombosis of unspecified deep veins of left distal lower extremity: Secondary | ICD-10-CM

## 2017-05-06 DIAGNOSIS — E119 Type 2 diabetes mellitus without complications: Secondary | ICD-10-CM | POA: Insufficient documentation

## 2017-05-06 MED ORDER — ASPIRIN EC 325 MG PO TBEC: 325.0000 mg | DELAYED_RELEASE_TABLET | Freq: Every day | ORAL | 3 refills | Status: AC

## 2017-05-06 NOTE — ED Notes (Signed)

## 2017-05-06 NOTE — ED Triage Notes (Addendum)
FIRST NURSE NOTE-left leg swelling.  Pain behind knee. Sx for 2-3 weeks.  No hx blood clot.  No injury. Notable swelling to left calf

## 2017-05-06 NOTE — ED Provider Notes (Signed)
[facility] Emergency Department Provider Note       Time seen: ----------------------------------------- 8:31 PM on 05/06/2017 -----------------------------------------   I have reviewed the triage vital signs and the nursing notes.  HISTORY   Chief Complaint Leg Pain    HPI Jacob Baker is a 47 y.o. male with a history of CHF, diabetes, GERD, hyperlipidemia and hypertension who presents to the ED for left calf pain.  Patient notes that Pain and swelling for the past 2-3 weeks.  He has not had any history of blood clot, no injury.  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  He denies fevers, chills or other complaints.  Past Medical History:  Diagnosis Date  . CHF (congestive heart failure) ([place])   . Diabetes mellitus without complication ([place])   . GERD (gastroesophageal reflux disease)   . History of migraine    none in over 2 yrs  . Hyperlipidemia   . Hypertension   . Syncope and collapse    x1 - approx 2-3 yrs ago - dehydration    Patient Active Problem List   Diagnosis Date Noted  . Chronic pain of right hip 10/15/2015  . Neuropathy 07/24/2015  . Essential hypertension 07/24/2015  . Hypertriglyceridemia 07/24/2015  . Problems with swallowing and mastication   . Encounter for preventive health examination 10/26/2014  . Pituitary insufficiency ([place]) 09/12/2014  . Low testosterone 09/11/2014  . Onychomycosis 04/18/2014  . Diabetes mellitus without complication ([place]) 04/54/0981  . Tobacco abuse disorder 11/24/2013  . Hiatal hernia 11/15/2013  . Tubular adenoma of colon 11/15/2013  . History of Helicobacter pylori infection 10/18/2013  . Gastritis due to nonsteroidal anti-inflammatory drug (NSAID) 10/14/2013  . Anxiety 04/26/2013  . Chest pain 10/12/2011  . Back ache 09/28/2011    Past Surgical History:  Procedure Laterality Date  . COLONOSCOPY    . ESOPHAGEAL DILATION  09/24/2016   Procedure: ESOPHAGEAL  DILATION;  Surgeon: Lucilla Lame, MD;  Location: [facility];  Service: Gastroenterology;;  . ESOPHAGOGASTRODUODENOSCOPY (EGD) WITH PROPOFOL N/A 04/08/2015   Procedure: ESOPHAGOGASTRODUODENOSCOPY (EGD) WITH PROPOFOL with dialation;  Surgeon: Lucilla Lame, MD;  Location: [facility];  Service: Endoscopy;  Laterality: N/A;  diabetic - oral meds  . ESOPHAGOGASTRODUODENOSCOPY (EGD) WITH PROPOFOL N/A 09/24/2016   Procedure: ESOPHAGOGASTRODUODENOSCOPY (EGD) WITH PROPOFOL WITH DILATION;  Surgeon: Lucilla Lame, MD;  Location: [facility];  Service: Gastroenterology;  Laterality: N/A;  Diabetic - oral meds  . FINGER AMPUTATION  2009   partial removal left index finger     Allergies Patient has no known allergies.  Social History Social History   Tobacco Use  . Smoking status: Current Every Day Smoker    Packs/day: 2.00    Years: 27.00    Pack years: 54.00    Types: Cigarettes  . Smokeless tobacco: Never Used  Substance Use Topics  . Alcohol use: No  . Drug use: No   Review of Systems Constitutional: Negative for fever. Cardiovascular: Negative for chest pain. Respiratory: Negative for shortness of breath. Gastrointestinal: Negative for abdominal pain, vomiting and diarrhea. Musculoskeletal: Positive for left calf pain Skin: Negative for rash. Neurological: Negative for headaches, focal weakness or numbness.  All systems negative/normal/unremarkable except as stated in the HPI  ____________________________________________   PHYSICAL EXAM:  VITAL SIGNS: ED Triage Vitals  Enc Vitals Group     BP 05/06/17 1833 (!) 142/86     Pulse Rate 05/06/17 1833 91  Resp 05/06/17 1833 16     Temp 05/06/17 1833 98.9 F (37.2 C)     Temp src --      SpO2 05/06/17 1833 97 %     Weight --      Height 05/06/17 1832 6\' 1"  (1.854 m)     Head Circumference --      Peak Flow --      Pain Score 05/06/17 1832 6     Pain Loc --      Pain Edu? --      Excl. in [place]? --     Constitutional: Alert and oriented. Well appearing and in no distress. Eyes: Conjunctivae are normal. Normal extraocular movements. Cardiovascular: Normal rate, regular rhythm. No murmurs, rubs, or gallops. Respiratory: Normal respiratory effort without tachypnea nor retractions. Breath sounds are clear and equal bilaterally. No wheezes/rales/rhonchi. Gastrointestinal: Soft and nontender. Normal bowel sounds Musculoskeletal: Mild left calf tenderness and swelling. Neurologic:  Normal speech and language. No gross focal neurologic deficits are appreciated.  Skin:  Skin is warm, dry and intact. No rash noted. Psychiatric: Mood and affect are normal. Speech and behavior are normal.  ____________________________________________  ED COURSE:  As part of my medical decision making, I reviewed the following data within the [facility] History obtained from family if available, nursing notes, old chart and ekg, as well as notes from prior ED visits. Patient presented for left calf pain, we will assess with imaging as indicated at this time.   Procedures ____________________________________________   RADIOLOGY Images were viewed by me  Left lower extremity ultrasound reveals IMPRESSION: Occlusive clot noted in the anterior and posterior tibial calf veins. ____________________________________________  DIFFERENTIAL DIAGNOSIS   DVT, superficial phlebitis, muscle strain, Baker's cyst  FINAL ASSESSMENT AND PLAN  Distal DVT   Plan: The patient had presented for left calf pain. Patient's imaging did reveal a distal DVT.  I will increase his aspirin from baby aspirin to an adult aspirin enteric-coated, he will need serial monitoring via ultrasound and our refer him to vascular surgery for same.  No cleared etiology for his clot.  He is cleared for outpatient follow-up.   Laurence Aly, MD   Note: This note was generated in part or whole with voice recognition  software. Voice recognition is usually quite accurate but there are transcription errors that can and very often do occur. I apologize for any typographical errors that were not detected and corrected.     Earleen Newport, MD 05/06/17 2033

## 2017-05-12 ENCOUNTER — Telehealth: Payer: Self-pay | Admitting: Internal Medicine

## 2017-05-12 DIAGNOSIS — I824Z2 Acute embolism and thrombosis of unspecified deep veins of left distal lower extremity: Secondary | ICD-10-CM

## 2017-05-12 NOTE — Telephone Encounter (Signed)
Please advise 

## 2017-05-12 NOTE — Telephone Encounter (Signed)
Copied from [place]. Topic: Referral - Request >> May 12, 2017  4:24 PM Margot Ables wrote: Reason for CRM: pt was in ER 05/06/17 and was DX with blood clot in his L leg. Pt states ER was supposed to refer him. He called ER because no one from vascular has contacted him and pt was advised PCP will need to refer. Please advise pt.

## 2017-05-13 ENCOUNTER — Telehealth: Payer: Self-pay

## 2017-05-13 ENCOUNTER — Other Ambulatory Visit: Payer: Self-pay

## 2017-05-13 ENCOUNTER — Encounter (HOSPITAL_COMMUNITY): Payer: Self-pay | Admitting: Emergency Medicine

## 2017-05-13 ENCOUNTER — Emergency Department (HOSPITAL_COMMUNITY)
Admission: EM | Admit: 2017-05-13 | Discharge: 2017-05-13 | Disposition: A | Discharge status: Home / Self Care | Payer: BLUE CROSS/BLUE SHIELD | Attending: Emergency Medicine | Admitting: Emergency Medicine

## 2017-05-13 DIAGNOSIS — Z86718 Personal history of other venous thrombosis and embolism: Secondary | ICD-10-CM | POA: Insufficient documentation

## 2017-05-13 DIAGNOSIS — I509 Heart failure, unspecified: Secondary | ICD-10-CM | POA: Insufficient documentation

## 2017-05-13 DIAGNOSIS — M7989 Other specified soft tissue disorders: Secondary | ICD-10-CM | POA: Diagnosis not present

## 2017-05-13 DIAGNOSIS — F17210 Nicotine dependence, cigarettes, uncomplicated: Secondary | ICD-10-CM | POA: Diagnosis not present

## 2017-05-13 DIAGNOSIS — Z89022 Acquired absence of left finger(s): Secondary | ICD-10-CM | POA: Insufficient documentation

## 2017-05-13 DIAGNOSIS — E119 Type 2 diabetes mellitus without complications: Secondary | ICD-10-CM | POA: Insufficient documentation

## 2017-05-13 DIAGNOSIS — M79605 Pain in left leg: Secondary | ICD-10-CM | POA: Diagnosis not present

## 2017-05-13 DIAGNOSIS — I110 Hypertensive heart disease with heart failure: Secondary | ICD-10-CM | POA: Diagnosis not present

## 2017-05-13 DIAGNOSIS — Z7982 Long term (current) use of aspirin: Secondary | ICD-10-CM | POA: Insufficient documentation

## 2017-05-13 DIAGNOSIS — Z79899 Other long term (current) drug therapy: Secondary | ICD-10-CM | POA: Diagnosis not present

## 2017-05-13 DIAGNOSIS — M79662 Pain in left lower leg: Secondary | ICD-10-CM | POA: Diagnosis not present

## 2017-05-13 HISTORY — DX: Acute embolism and thrombosis of unspecified deep veins of unspecified lower extremity: I82.409

## 2017-05-13 MED ORDER — ENOXAPARIN SODIUM 100 MG/ML ~~LOC~~ SOLN
1.0000 mg/kg | Freq: Once | SUBCUTANEOUS | Status: AC
  Administered 2017-05-13: 95 mg via SUBCUTANEOUS
  Filled 2017-05-13: qty 1

## 2017-05-13 NOTE — ED Notes (Signed)
Pt expressed understanding of calling radiology scheduling department for ultrasound appointment tomorrow, phone number given to pt,

## 2017-05-13 NOTE — Discharge Instructions (Signed)
Registered nurse will give you information for a repeat ultrasound of your leg tomorrow.

## 2017-05-13 NOTE — Telephone Encounter (Signed)
Will pt need an appt before getting the referral?

## 2017-05-13 NOTE — Telephone Encounter (Signed)
Patient states that he is going to the ER due to the pain and swelling in his leg. He states that he has a vein and vascular Dr. Parks Baker he would like to see.Dr. Lucky Cowboy and the number is [phone] a referral has been put in by Dr. Derrel Nip. Patient is waiting to hear back about the referral.  Copied from [place] [id number]. Topic: Referral - Status >> May 13, 2017  3:38 PM Jacob Baker wrote: Reason for CRM:Pt called to check on referral status to vein and vascular, he stated he has not heard from anyone and the swelling is back in his leg, I tried to get pt to speak with Nurse Triage team  but he declined and said he was going to go back to the ER

## 2017-05-13 NOTE — ED Triage Notes (Signed)
States he was diagnosed with a blood clot last week and the symptoms have returned. SWELLING AND PAIN LEFT LEG, STATES HE  WAS PRESCRIBED ASPIRIN

## 2017-05-13 NOTE — Telephone Encounter (Signed)
No,  I have put the referral in as urgent since ER doc did not do it.  Is patient taking the full dose aspirin as directed by Dr Jimmye Norman?

## 2017-05-13 NOTE — Telephone Encounter (Signed)
LMTCB. [facility] may speak with pt.  

## 2017-05-14 ENCOUNTER — Ambulatory Visit (HOSPITAL_COMMUNITY)
Admission: RE | Admit: 2017-05-14 | Discharge: 2017-05-14 | Disposition: A | Discharge status: Home / Self Care | Payer: BLUE CROSS/BLUE SHIELD | Source: Ambulatory Visit | Attending: Emergency Medicine | Admitting: Emergency Medicine

## 2017-05-14 DIAGNOSIS — Z86718 Personal history of other venous thrombosis and embolism: Secondary | ICD-10-CM | POA: Insufficient documentation

## 2017-05-14 DIAGNOSIS — M79662 Pain in left lower leg: Secondary | ICD-10-CM | POA: Diagnosis not present

## 2017-05-14 NOTE — ED Provider Notes (Signed)
[facility] Provider Note   CSN: [id number] Arrival date & time: 05/13/17  1737     History   Chief Complaint Chief Complaint  Patient presents with  . Leg Pain    HPI Jacob Baker is a 47 y.o. male.  Patient presents with persistent swelling in the left leg in the proximal calf in the popliteal area.  Apparently was evaluated at [facility] on 05/06/17 and a Doppler study was performed which revealed occlusive clot in the anterior and posterior tibial calf veins.  He was prescribed Bayer Aspirin.  The pain and swelling have become worse.  No chest pain or dyspnea.  Severity of symptoms is moderate.  Palpation makes symptoms worse.  Past medical history includes diabetes, CHF, hypertension, syncope and collapse (secondary to dehydration)     Past Medical History:  Diagnosis Date  . CHF (congestive heart failure) ([place])   . Diabetes mellitus without complication ([place])   . DVT (deep venous thrombosis) ([place])   . GERD (gastroesophageal reflux disease)   . History of migraine    none in over 2 yrs  . Hyperlipidemia   . Hypertension   . Syncope and collapse    x1 - approx 2-3 yrs ago - dehydration    Patient Active Problem List   Diagnosis Date Noted  . Chronic pain of right hip 10/15/2015  . Neuropathy 07/24/2015  . Essential hypertension 07/24/2015  . Hypertriglyceridemia 07/24/2015  . Problems with swallowing and mastication   . Encounter for preventive health examination 10/26/2014  . Pituitary insufficiency ([place]) 09/12/2014  . Low testosterone 09/11/2014  . Onychomycosis 04/18/2014  . Diabetes mellitus without complication ([place]) 94/70/9628  . Tobacco abuse disorder 11/24/2013  . Hiatal hernia 11/15/2013  . Tubular adenoma of colon 11/15/2013  . History of Helicobacter pylori infection 10/18/2013  . Gastritis due to nonsteroidal anti-inflammatory drug (NSAID) 10/14/2013  . Anxiety 04/26/2013  . Chest pain 10/12/2011  . Back ache  09/28/2011    Past Surgical History:  Procedure Laterality Date  . COLONOSCOPY    . ESOPHAGEAL DILATION  09/24/2016   Procedure: ESOPHAGEAL DILATION;  Surgeon: Lucilla Lame, MD;  Location: [place];  Service: Gastroenterology;;  . ESOPHAGOGASTRODUODENOSCOPY (EGD) WITH PROPOFOL N/A 04/08/2015   Procedure: ESOPHAGOGASTRODUODENOSCOPY (EGD) WITH PROPOFOL with dialation;  Surgeon: Lucilla Lame, MD;  Location: [place];  Service: Endoscopy;  Laterality: N/A;  diabetic - oral meds  . ESOPHAGOGASTRODUODENOSCOPY (EGD) WITH PROPOFOL N/A 09/24/2016   Procedure: ESOPHAGOGASTRODUODENOSCOPY (EGD) WITH PROPOFOL WITH DILATION;  Surgeon: Lucilla Lame, MD;  Location: [place];  Service: Gastroenterology;  Laterality: N/A;  Diabetic - oral meds  . FINGER AMPUTATION  2009   partial removal left index finger         Home Medications    Prior to Admission medications   Medication Sig Start Date End Date Taking? Authorizing Provider  amLODipine (NORVASC) 10 MG tablet TAKE 1 TABLET(10 MG) BY MOUTH DAILY 02/22/17  Yes [name], MD  aspirin EC 325 MG tablet Take 1 tablet (325 mg total) by mouth daily. 05/06/17 05/06/18 Yes Earleen Newport, MD  buPROPion [facility] SR) 200 MG 12 hr tablet Take 200 mg by mouth 2 (two) times daily.   Yes [provider]  cyanocobalamin 1000 MCG tablet Take 100 mcg by mouth daily.   Yes [provider]  pantoprazole (PROTONIX) 40 MG tablet Take 1 tablet (40 mg total) by mouth daily. 12/16/16  Yes Lucilla Lame, MD  traMADol (ULTRAM) 50 MG tablet Take 1 tablet (50 mg total) by mouth every 8 (eight) hours as needed. 11/23/16  Yes [name], MD  glucose blood (ONETOUCH VERIO) test strip Test sugars twice daily 04/26/14   Phadke, Radhika P, MD  JANUVIA 50 MG tablet TAKE 1 TABLET BY MOUTH ONCE DAILY Patient not taking: Reported on 05/13/2017 03/09/17   [name], MD  metroNIDAZOLE (FLAGYL) 500 MG tablet Take 1 tablet (500  mg total) by mouth 3 (three) times daily. Patient not taking: Reported on 02/23/2017 02/15/17   [name], MD  [facility] DELICA LANCETS 26S MISC Test sugars twice daily 04/26/14   Phadke, Karsten Ro, MD  sildenafil (VIAGRA) 100 MG tablet Take 1 tablet (100 mg total) by mouth daily as needed for erectile dysfunction. Patient not taking: Reported on 02/23/2017 09/15/16   Renato Shin, MD    Family History Family History  Problem Relation Age of Onset  . Heart disease Father        CAD  . Hypertension Father   . Diabetes Father   . Hypertension Sister   . Hypertension Brother   . Hypertension Brother   . Hypertension Mother     Social History Social History   Tobacco Use  . Smoking status: Current Every Day Smoker    Packs/day: 2.00    Years: 27.00    Pack years: 54.00    Types: Cigarettes  . Smokeless tobacco: Never Used  Substance Use Topics  . Alcohol use: No  . Drug use: No     Allergies   Patient has no known allergies.   Review of Systems Review of Systems  All other systems reviewed and are negative.    Physical Exam Updated Vital Signs BP (!) 131/94 (BP Location: Right Arm)   Pulse 92   Temp 98.1 F (36.7 C) (Oral)   Resp 17   Ht 6\' 1"  (1.854 m)   Wt 92.5 kg (204 lb)   SpO2 99%   BMI 26.91 kg/m   Physical Exam  Constitutional: He is oriented to person, place, and time. He appears well-developed and well-nourished.  HENT:  Head: Normocephalic and atraumatic.  Eyes: Conjunctivae are normal.  Neck: Neck supple.  Cardiovascular: Normal rate and regular rhythm.  Pulmonary/Chest: Effort normal and breath sounds normal.  Abdominal: Soft. Bowel sounds are normal.  Musculoskeletal:  Left lower extremity: Tenderness and swelling in the popliteal area and proximal gastrocnemius.  Neurological: He is alert and oriented to person, place, and time.  Skin: Skin is warm and dry.  Psychiatric: He has a normal mood and affect. His behavior is normal.    Nursing note and vitals reviewed.    ED Treatments / Results  Labs (all labs ordered are listed, but only abnormal results are displayed) Labs Reviewed - No data to display  EKG None  Radiology No results found.  Procedures Procedures (including critical care time)  Medications Ordered in ED Medications  enoxaparin (LOVENOX) injection 95 mg (95 mg Subcutaneous Given 05/13/17 2214)     Initial Impression / Assessment and Plan / ED Course  I have reviewed the triage vital signs and the nursing notes.  Pertinent labs & imaging results that were available during my care of the patient were reviewed by me and considered in my medical decision making (see chart for details).     Patient presents with worsening swelling and pain in the left lower extremity.  Lovenox 1 mg/kg subcu administered.  Patient  will return Friday morning for a repeat ultrasound of his lower extremity.  Final Clinical Impressions(s) / ED Diagnoses   Final diagnoses:  Pain and swelling of left lower extremity    ED Discharge Orders        Ordered    US Venous Img Lower Unilateral Left     05/13/17 2304       Nat Christen, MD 05/14/17 [id number]

## 2017-05-17 ENCOUNTER — Encounter (INDEPENDENT_AMBULATORY_CARE_PROVIDER_SITE_OTHER): Payer: Self-pay | Admitting: Vascular Surgery

## 2017-05-17 ENCOUNTER — Ambulatory Visit (INDEPENDENT_AMBULATORY_CARE_PROVIDER_SITE_OTHER): Payer: BLUE CROSS/BLUE SHIELD | Admitting: Vascular Surgery

## 2017-05-17 VITALS — BP 140/88 | HR 98 | Resp 17 | Ht 73.0 in | Wt 206.0 lb

## 2017-05-17 DIAGNOSIS — R600 Localized edema: Secondary | ICD-10-CM

## 2017-05-17 DIAGNOSIS — E119 Type 2 diabetes mellitus without complications: Secondary | ICD-10-CM

## 2017-05-17 DIAGNOSIS — I82442 Acute embolism and thrombosis of left tibial vein: Secondary | ICD-10-CM | POA: Diagnosis not present

## 2017-05-17 NOTE — Progress Notes (Signed)
Subjective:    Patient ID: Jacob Baker, male    DOB: Feb 18, 1970, 47 y.o.   MRN: [id number] Chief Complaint  Patient presents with  . Follow-up    [facility] for DVT   Presents as a new patient originally seen at [facility] and diagnosed with "occlusive clot noted in the left anterior and left posterior tibial calf veins" on May 09, 2017.  The patient was treated with 1 dose of the Lovenox and placed on aspirin 325 mg daily.  The patient denied any recent surgery or trauma to the left lower extremity.  The patient does experience bilateral lower extremity edema on a daily basis.  The patient notes his swelling worsens towards the end of the day.  At this time, the patient does engage in conservative therapy however he has been wearing his compression socks at night and not during the day.  The patient underwent a left lower extremity venous duplex approximately 1 week later on May 14, 2017 and was found to have "no evidence of acute or chronic DVT within the left lower extremity with special attention paid to the tibial veins".  The patient continues to experience bilateral lower extremity edema and associated discomfort with that edema.  The patient notes his discomfort has progressed to the point that he is unable to function on a daily basis and feels that his symptoms have become lifestyle limiting.  The patient denies any shortness of breath or chest pain.  The patient denies any fever, nausea vomiting.  Review of Systems  Constitutional: Negative.   HENT: Negative.   Eyes: Negative.   Respiratory: Negative.   Cardiovascular: Positive for leg swelling.       Left lower extremity DVT  Gastrointestinal: Negative.   Endocrine: Negative.   Genitourinary: Negative.   Musculoskeletal: Negative.   Skin: Negative.   Allergic/Immunologic: Negative.   Neurological: Negative.   Hematological: Negative.   Psychiatric/Behavioral: Negative.       Objective:   Physical  Exam  Constitutional: He is oriented to person, place, and time. He appears well-developed and well-nourished. No distress.  HENT:  Head: Normocephalic and atraumatic.  Right Ear: External ear normal.  Left Ear: External ear normal.  Eyes: Pupils are equal, round, and reactive to light. Conjunctivae and EOM are normal.  Neck: Normal range of motion.  Cardiovascular: Normal rate, regular rhythm and normal heart sounds.  Pulmonary/Chest: Effort normal.  Musculoskeletal: Normal range of motion. He exhibits edema (Mild to moderate nonpitting edema noted bilaterally).  Neurological: He is alert and oriented to person, place, and time.  Skin: Skin is warm and dry. He is not diaphoretic.  Psychiatric: He has a normal mood and affect. His behavior is normal. Judgment and thought content normal.  Vitals reviewed.  BP 140/88 (BP Location: Right Arm)   Pulse 98   Resp 17   Ht 6\' 1"  (1.854 m)   Wt 206 lb (93.4 kg)   BMI 27.18 kg/m   Past Medical History:  Diagnosis Date  . CHF (congestive heart failure) ([place])   . Diabetes mellitus without complication ([place])   . DVT (deep venous thrombosis) ([place])   . GERD (gastroesophageal reflux disease)   . History of migraine    none in over 2 yrs  . Hyperlipidemia   . Hypertension   . Syncope and collapse    x1 - approx 2-3 yrs ago - dehydration   Social History   Socioeconomic History  . Marital status: Married  Spouse name: Not on file  . Number of children: Not on file  . Years of education: Not on file  . Highest education level: Not on file  Occupational History  . Not on file  Social Needs  . Financial resource strain: Not on file  . Food insecurity:    Worry: Not on file    Inability: Not on file  . Transportation needs:    Medical: Not on file    Non-medical: Not on file  Tobacco Use  . Smoking status: Current Every Day Smoker    Packs/day: 2.00    Years: 27.00    Pack years: 54.00    Types: Cigarettes  . Smokeless  tobacco: Never Used  Substance and Sexual Activity  . Alcohol use: No  . Drug use: No  . Sexual activity: Not on file  Lifestyle  . Physical activity:    Days per week: Not on file    Minutes per session: Not on file  . Stress: Not on file  Relationships  . Social connections:    Talks on phone: Not on file    Gets together: Not on file    Attends religious service: Not on file    Active member of club or organization: Not on file    Attends meetings of clubs or organizations: Not on file    Relationship status: Not on file  . Intimate partner violence:    Fear of current or ex partner: Not on file    Emotionally abused: Not on file    Physically abused: Not on file    Forced sexual activity: Not on file  Other Topics Concern  . Not on file  Social History Narrative  . Not on file   Past Surgical History:  Procedure Laterality Date  . COLONOSCOPY    . ESOPHAGEAL DILATION  09/24/2016   Procedure: ESOPHAGEAL DILATION;  Surgeon: Lucilla Lame, MD;  Location: [place];  Service: Gastroenterology;;  . ESOPHAGOGASTRODUODENOSCOPY (EGD) WITH PROPOFOL N/A 04/08/2015   Procedure: ESOPHAGOGASTRODUODENOSCOPY (EGD) WITH PROPOFOL with dialation;  Surgeon: Lucilla Lame, MD;  Location: [place];  Service: Endoscopy;  Laterality: N/A;  diabetic - oral meds  . ESOPHAGOGASTRODUODENOSCOPY (EGD) WITH PROPOFOL N/A 09/24/2016   Procedure: ESOPHAGOGASTRODUODENOSCOPY (EGD) WITH PROPOFOL WITH DILATION;  Surgeon: Lucilla Lame, MD;  Location: [place];  Service: Gastroenterology;  Laterality: N/A;  Diabetic - oral meds  . FINGER AMPUTATION  2009   partial removal left index finger    Family History  Problem Relation Age of Onset  . Heart disease Father        CAD  . Hypertension Father   . Diabetes Father   . Hypertension Sister   . Hypertension Brother   . Hypertension Brother   . Hypertension Mother    No Known Allergies     Assessment & Plan:  Presents as a  new patient originally seen at [facility] and diagnosed with "occlusive clot noted in the left anterior and left posterior tibial calf veins" on May 09, 2017.  The patient was treated with 1 dose of the Lovenox and placed on aspirin 325 mg daily.  The patient denied any recent surgery or trauma to the left lower extremity.  The patient does experience bilateral lower extremity edema on a daily basis.  The patient notes his swelling worsens towards the end of the day.  At this time, the patient does engage in conservative therapy however he has been wearing  his compression socks at night and not during the day.  The patient underwent a left lower extremity venous duplex approximately 1 week later on May 14, 2017 and was found to have "no evidence of acute or chronic DVT within the left lower extremity with special attention paid to the tibial veins".  The patient continues to experience bilateral lower extremity edema and associated discomfort with that edema.  The patient notes his discomfort has progressed to the point that he is unable to function on a daily basis and feels that his symptoms have become lifestyle limiting.  The patient denies any shortness of breath or chest pain.  The patient denies any fever, nausea vomiting.  1. Acute deep vein thrombosis (DVT) of tibial vein of left lower extremity ([place]) - New On the patient's left lower extremity DVT study conducted on May 14, 2017 the anterior tibial and posterior tibial DVT have resolved The patient can stop taking aspirin 325mg  daily  - VAS [place] LOWER EXTREMITY VENOUS REFLUX; Future  2. Diabetes mellitus without complication ([place]) - Stable Encouraged good control as its slows the progression of atherosclerotic, venous lymphatic disease  3. Bilateral lower extremity edema - New The patient was encouraged to wear graduated compression stockings (20-30 mmHg) on a daily basis. The patient was instructed to begin wearing  the stockings first thing in the morning and removing them in the evening. The patient was instructed specifically not to sleep in the stockings. Prescription given. In addition, behavioral modification including elevation during the day will be initiated. Anti-inflammatories for pain. I will bring the patient back at his convenience to undergo bilateral lower extremity venous duplex to rule out any reflux The patient will follow up in three months to asses conservative management.  The patient was instructed to call the office in the interim if any worsening edema or ulcerations to the legs, feet or toes occurs. The patient expresses their understanding.  - VAS [place] LOWER EXTREMITY VENOUS REFLUX; Future  Current Outpatient Medications on File Prior to Visit  Medication Sig Dispense Refill  . amLODipine (NORVASC) 10 MG tablet TAKE 1 TABLET(10 MG) BY MOUTH DAILY 30 tablet 5  . aspirin EC 325 MG tablet Take 1 tablet (325 mg total) by mouth daily. 100 tablet 3  . buPROPion (WELLBUTRIN SR) 200 MG 12 hr tablet Take 200 mg by mouth 2 (two) times daily.    . cyanocobalamin 1000 MCG tablet Take 100 mcg by mouth daily.    Marland Kitchen glucose blood (ONETOUCH VERIO) test strip Test sugars twice daily 50 each 6  . ONETOUCH DELICA LANCETS 63F MISC Test sugars twice daily 50 each 3  . pantoprazole (PROTONIX) 40 MG tablet Take 1 tablet (40 mg total) by mouth daily. 30 tablet 6  . traMADol (ULTRAM) 50 MG tablet Take 1 tablet (50 mg total) by mouth every 8 (eight) hours as needed. 90 tablet 1  . JANUVIA 50 MG tablet TAKE 1 TABLET BY MOUTH ONCE DAILY (Patient not taking: Reported on 05/13/2017) 90 tablet 1  . metroNIDAZOLE (FLAGYL) 500 MG tablet Take 1 tablet (500 mg total) by mouth 3 (three) times daily. (Patient not taking: Reported on 02/23/2017) 30 tablet 0  . sildenafil (VIAGRA) 100 MG tablet Take 1 tablet (100 mg total) by mouth daily as needed for erectile dysfunction. (Patient not taking: Reported on 02/23/2017) 30  tablet 1   No current facility-administered medications on file prior to visit.    There are no Patient Instructions on file for  this visit. No follow-ups on file.  Tocarra Gassen A Dylann Layne, PA-C

## 2017-05-18 NOTE — Telephone Encounter (Signed)
Spoke with pt and he stated that he saw the Vascular doctor yesterday.

## 2017-05-19 ENCOUNTER — Encounter (HOSPITAL_COMMUNITY): Payer: Self-pay

## 2017-05-19 ENCOUNTER — Emergency Department (HOSPITAL_COMMUNITY)
Admission: EM | Admit: 2017-05-19 | Discharge: 2017-05-20 | Disposition: A | Discharge status: Home / Self Care | Payer: BLUE CROSS/BLUE SHIELD | Attending: Emergency Medicine | Admitting: Emergency Medicine

## 2017-05-19 ENCOUNTER — Other Ambulatory Visit: Payer: Self-pay

## 2017-05-19 DIAGNOSIS — F17210 Nicotine dependence, cigarettes, uncomplicated: Secondary | ICD-10-CM | POA: Diagnosis not present

## 2017-05-19 DIAGNOSIS — M79662 Pain in left lower leg: Secondary | ICD-10-CM | POA: Insufficient documentation

## 2017-05-19 DIAGNOSIS — M7989 Other specified soft tissue disorders: Secondary | ICD-10-CM

## 2017-05-19 DIAGNOSIS — I509 Heart failure, unspecified: Secondary | ICD-10-CM | POA: Insufficient documentation

## 2017-05-19 DIAGNOSIS — Z79899 Other long term (current) drug therapy: Secondary | ICD-10-CM | POA: Insufficient documentation

## 2017-05-19 DIAGNOSIS — I110 Hypertensive heart disease with heart failure: Secondary | ICD-10-CM | POA: Diagnosis not present

## 2017-05-19 DIAGNOSIS — E119 Type 2 diabetes mellitus without complications: Secondary | ICD-10-CM | POA: Diagnosis not present

## 2017-05-19 DIAGNOSIS — R2242 Localized swelling, mass and lump, left lower limb: Secondary | ICD-10-CM | POA: Diagnosis present

## 2017-05-19 LAB — I-STAT CHEM 8, ED
BUN: 6 mg/dL (ref 6–20)
CALCIUM ION: 1.12 mmol/L — AB (ref 1.15–1.40)
CREATININE: 0.9 mg/dL (ref 0.61–1.24)
Chloride: 104 mmol/L (ref 101–111)
GLUCOSE: 165 mg/dL — AB (ref 65–99)
HCT: 31 % — ABNORMAL LOW (ref 39.0–52.0)
Hemoglobin: 10.5 g/dL — ABNORMAL LOW (ref 13.0–17.0)
POTASSIUM: 3.4 mmol/L — AB (ref 3.5–5.1)
Sodium: 141 mmol/L (ref 135–145)
TCO2: 24 mmol/L (ref 22–32)

## 2017-05-19 LAB — CBG MONITORING, ED: GLUCOSE-CAPILLARY: 166 mg/dL — AB (ref 65–99)

## 2017-05-19 MED ORDER — ACETAMINOPHEN 325 MG PO TABS
650.0000 mg | ORAL_TABLET | Freq: Once | ORAL | Status: AC
  Administered 2017-05-19: 650 mg via ORAL
  Filled 2017-05-19: qty 2

## 2017-05-19 NOTE — ED Notes (Addendum)
Pt's family member came to nurses station asking what the hold up was. Pt. Family member asked if the pt could have something to eat, "he is a diabetic and has not eaten since 6am today" I informed her and the pt. That I spoke with the PA and said she would be in as soon as she could. I also checked the pts CBG and the pt. Verbalized understanding about the delay. While checking pts CBG, family member stated she was upset that there were nurses out at the desk and not one of [place] could help the PA. I informed her the nurse is not able to put in orders for the PA, that the PA needs to see the pt. and she told this nurse that she would be in his room next. The pt verbalized understanding.

## 2017-05-19 NOTE — ED Provider Notes (Signed)
[facility] EMERGENCY DEPARTMENT Provider Note   CSN: [id number] Arrival date & time: 05/19/17  1753     History   Chief Complaint No chief complaint on file.   HPI Jacob Baker is a 47 y.o. male with a history of CHF, diabetes mellitus, DVT, GERD, HLD, and HTN who presents to the emergency department with a chief complaint of left calf pain and swelling.  He was initially seen in the ED on 05/06/2017 for left calf pain where he had a DVT study which showed an occlusive clot in the anterior and posterior tibial calf veins.  He was discharged home after his baby aspirin was increased to 325 mg aspirin and was referred to vascular surgery.  He returned to the emergency department on 05/13/2017 after his symptoms returned.  Venous duplex imaging was not available, so the patient was treated with an injection of Lovenox and was discharged home 4 AM follow-up for a repeat venous duplex study.  Repeat venous duplex was negative.  Followed up with vascular surgery on 05/17/2017 and saw PA Stegmayer who stopped his ASA 325, encouraged him to wear compression stockings, was given anti-inflammatories for pain control.  She also stated that she would bring the patient back in any time to undergo bilateral lower extremity venous duplex to rule out any reflux.  He is scheduled to follow back up in 3 months.  He presents to the emergency department today due to constant, worsening left calf pain and swelling over the last 2 days.  He states that the pain has gotten so severe that it is starting to impact his job.  He denies left ankle, knee, or hip pain, numbness, or weakness.  No new falls or injuries.  He has no shortness of breath, chest pain, back pain, abdominal pain, nausea, vomiting, or diarrhea.  He reports that he has been wearing his compression stockings with no relief.  The history is provided by the patient. No language interpreter was used.    Past Medical History:    Diagnosis Date  . CHF (congestive heart failure) ([place])   . Diabetes mellitus without complication ([place])   . DVT (deep venous thrombosis) ([place])   . GERD (gastroesophageal reflux disease)   . History of migraine    none in over 2 yrs  . Hyperlipidemia   . Hypertension   . Syncope and collapse    x1 - approx 2-3 yrs ago - dehydration    Patient Active Problem List   Diagnosis Date Noted  . Chronic pain of right hip 10/15/2015  . Neuropathy 07/24/2015  . Essential hypertension 07/24/2015  . Hypertriglyceridemia 07/24/2015  . Problems with swallowing and mastication   . Encounter for preventive health examination 10/26/2014  . Pituitary insufficiency ([place]) 09/12/2014  . Low testosterone 09/11/2014  . Onychomycosis 04/18/2014  . Diabetes mellitus without complication ([place]) 35/57/3220  . Tobacco abuse disorder 11/24/2013  . Hiatal hernia 11/15/2013  . Tubular adenoma of colon 11/15/2013  . History of Helicobacter pylori infection 10/18/2013  . Gastritis due to nonsteroidal anti-inflammatory drug (NSAID) 10/14/2013  . Anxiety 04/26/2013  . Chest pain 10/12/2011  . Back ache 09/28/2011    Past Surgical History:  Procedure Laterality Date  . COLONOSCOPY    . ESOPHAGEAL DILATION  09/24/2016   Procedure: ESOPHAGEAL DILATION;  Surgeon: Lucilla Lame, MD;  Location: [facility];  Service: Gastroenterology;;  . ESOPHAGOGASTRODUODENOSCOPY (EGD) WITH PROPOFOL N/A 04/08/2015   Procedure: ESOPHAGOGASTRODUODENOSCOPY (EGD) WITH PROPOFOL with dialation;  Surgeon: Lucilla Lame, MD;  Location: [facility];  Service: Endoscopy;  Laterality: N/A;  diabetic - oral meds  . ESOPHAGOGASTRODUODENOSCOPY (EGD) WITH PROPOFOL N/A 09/24/2016   Procedure: ESOPHAGOGASTRODUODENOSCOPY (EGD) WITH PROPOFOL WITH DILATION;  Surgeon: Lucilla Lame, MD;  Location: [facility];  Service: Gastroenterology;  Laterality: N/A;  Diabetic - oral meds  . FINGER AMPUTATION  2009   partial removal left  index finger         Home Medications    Prior to Admission medications   Medication Sig Start Date End Date Taking? Authorizing Provider  amLODipine (NORVASC) 10 MG tablet TAKE 1 TABLET(10 MG) BY MOUTH DAILY 02/22/17   [name], MD  aspirin EC 325 MG tablet Take 1 tablet (325 mg total) by mouth daily. 05/06/17 05/06/18  Earleen Newport, MD  buPROPion (WELLBUTRIN SR) 200 MG 12 hr tablet Take 200 mg by mouth 2 (two) times daily.    [provider]  cyanocobalamin 1000 MCG tablet Take 100 mcg by mouth daily.    [provider]  glucose blood (ONETOUCH VERIO) test strip Test sugars twice daily 04/26/14   Phadke, Radhika P, MD  JANUVIA 50 MG tablet TAKE 1 TABLET BY MOUTH ONCE DAILY Patient not taking: Reported on 05/13/2017 03/09/17   [name], MD  metroNIDAZOLE (FLAGYL) 500 MG tablet Take 1 tablet (500 mg total) by mouth 3 (three) times daily. Patient not taking: Reported on 02/23/2017 02/15/17   [name], MD  [facility] DELICA LANCETS 09N MISC Test sugars twice daily 04/26/14   Phadke, Karsten Ro, MD  pantoprazole (PROTONIX) 40 MG tablet Take 1 tablet (40 mg total) by mouth daily. 12/16/16   Lucilla Lame, MD  sildenafil (VIAGRA) 100 MG tablet Take 1 tablet (100 mg total) by mouth daily as needed for erectile dysfunction. Patient not taking: Reported on 02/23/2017 09/15/16   Renato Shin, MD  traMADol (ULTRAM) 50 MG tablet Take 1 tablet (50 mg total) by mouth every 8 (eight) hours as needed. 11/23/16   [name], MD    Family History Family History  Problem Relation Age of Onset  . Heart disease Father        CAD  . Hypertension Father   . Diabetes Father   . Hypertension Sister   . Hypertension Brother   . Hypertension Brother   . Hypertension Mother     Social History Social History   Tobacco Use  . Smoking status: Current Every Day Smoker    Packs/day: 2.00    Years: 27.00    Pack years: 54.00    Types: Cigarettes  . Smokeless  tobacco: Never Used  Substance Use Topics  . Alcohol use: No  . Drug use: No     Allergies   Patient has no known allergies.   Review of Systems Review of Systems  Constitutional: Negative for activity change, appetite change, chills and fever.  Respiratory: Negative for shortness of breath.   Cardiovascular: Positive for leg swelling. Negative for chest pain and palpitations.  Gastrointestinal: Negative for abdominal pain.  Genitourinary: Negative for dysuria.  Musculoskeletal: Positive for myalgias. Negative for arthralgias and back pain.  Skin: Negative for rash.  Allergic/Immunologic: Negative for immunocompromised state.  Neurological: Negative for dizziness, seizures, weakness, numbness and headaches.  Psychiatric/Behavioral: Negative for confusion.   Physical Exam Updated Vital Signs BP 128/82 (BP Location: Right Arm)   Pulse 73   Temp 98.7 F (37.1 C) (Oral)   Resp 16  SpO2 100%   Physical Exam  Constitutional: He appears well-developed.  HENT:  Head: Normocephalic.  Eyes: Conjunctivae are normal.  Neck: Normal range of motion. Neck supple.  Cardiovascular: Normal rate, regular rhythm, normal heart sounds and intact distal pulses. Exam reveals no friction rub.  No murmur heard. Pulmonary/Chest: Effort normal. No stridor. No respiratory distress. He has no wheezes. He has no rales. He exhibits no tenderness.  Abdominal: Soft. He exhibits no distension.  Musculoskeletal: He exhibits edema and tenderness. He exhibits no deformity.  Tender to palpation diffusely to the left calf.  The left calf is noted to be at least a third larger in circumference than the right calf.  +Homan's sign.   No overlying erythema or warmth.  No tenderness to the left thigh.  No noted varicose or extended veins.  DP and PT pulses are 2+ and symmetric.  He has 5 out of 5 strength against resistance with dorsiflexion plantarflexion.  Full active and passive range of motion of the left  ankle and knee.  No pain with range of motion of the left ankle, knee, or left hip.  He is able to bear weight on the bilateral lower extremities and has an antalgic gait.    Neurological: He is alert.  Skin: Skin is warm and dry.  Psychiatric: His behavior is normal.  Nursing note and vitals reviewed.   ED Treatments / Results  Labs (all labs ordered are listed, but only abnormal results are displayed) Labs Reviewed  CBG MONITORING, ED - Abnormal; Notable for the following components:      Result Value   Glucose-Capillary 166 (*)    All other components within normal limits  I-STAT CHEM 8, ED - Abnormal; Notable for the following components:   Potassium 3.4 (*)    Glucose, Bld 165 (*)    Calcium, Ion 1.12 (*)    Hemoglobin 10.5 (*)    HCT 31.0 (*)    All other components within normal limits    EKG None  Radiology No results found.  Procedures Procedures (including critical care time)  Medications Ordered in ED Medications  acetaminophen (TYLENOL) tablet 650 mg (650 mg Oral Given 05/19/17 2359)  Rivaroxaban (XARELTO) tablet 15 mg (15 mg Oral Given 05/20/17 0118)     Initial Impression / Assessment and Plan / ED Course  I have reviewed the triage vital signs and the nursing notes.  Pertinent labs & imaging results that were available during my care of the patient were reviewed by me and considered in my medical decision making (see chart for details).     47 year old male with a history of CHF, diabetes mellitus, DVT, GERD, HLD, and HTN who presents to the emergency department with a chief complaint of left calf pain and swelling.  This is been worsening over the last 2 days.  The patient was seen on May 06, 2016 at [facility] and had a positive venous duplex study for a DVT in the left lower extremity that was negative when it was repeated on April 19.  He initially been treated with increasing his ASA from a baby aspirin to 325 mg.  When he was seen in the  ED on April 18 and venous duplex study was not available, he was also treated with 1 dose of Lovenox, but has had no other treatment since.  He followed up with vascular surgery on 4/22 and has been compliant with home recommendations of compression stockings and anti-inflammatories for pain control with  no improvement in his symptoms.  On my exam, the left calf is noted to be at least one third larger in circumference as compared to the right.  Although Homans sign is not extremely sensitive or specific, he does have a positive Homans sign.  Symptoms do not appear to be arthralgia as the patient has no pain with passive range of motion or reproducible tenderness to the joints of the left lower extremity.   The patient was discussed with Dr. Kathrynn Humble, attending physician, who recommended an i-STAT Chem-8 to evaluate for the patient's renal function, giving him a dose of Xarelto in the ED, and a repeat venous duplex study in the a.m.  This plan was discussed with the patient and his significant other who are agreeable at this time.  Tylenol given for pain control in the ED.  Venous duplex study has been ordered at any pain, which was the patient's most convenient location to his place of employment as he does not want to miss work although his symptoms have significantly worsened.  Strict return precautions given.  He is hemodynamically stable and in no acute distress.  The patient is safe for discharge home with follow-up in the a.m.  Final Clinical Impressions(s) / ED Diagnoses   Final diagnoses:  Left leg swelling  Pain of left calf    ED Discharge Orders        Ordered    US Venous Img Lower Unilateral Left     05/20/17 0046       Joline Maxcy A, PA-C 05/20/17 3754    Varney Biles, MD 05/21/17 [phone]

## 2017-05-19 NOTE — ED Triage Notes (Signed)
Patient complains of ongoing left lower leg swelling and pain since being diagnosed with DVT recently. Had repeat U/S last Friday and DVT resolved, NAD

## 2017-05-20 ENCOUNTER — Ambulatory Visit (HOSPITAL_COMMUNITY)
Admission: RE | Admit: 2017-05-20 | Discharge: 2017-05-20 | Disposition: A | Discharge status: Home / Self Care | Payer: BLUE CROSS/BLUE SHIELD | Source: Ambulatory Visit | Attending: Emergency Medicine | Admitting: Emergency Medicine

## 2017-05-20 DIAGNOSIS — M7989 Other specified soft tissue disorders: Secondary | ICD-10-CM | POA: Insufficient documentation

## 2017-05-20 DIAGNOSIS — R600 Localized edema: Secondary | ICD-10-CM | POA: Diagnosis not present

## 2017-05-20 MED ORDER — RIVAROXABAN 15 MG PO TABS
15.0000 mg | ORAL_TABLET | Freq: Once | ORAL | Status: AC
  Administered 2017-05-20: 15 mg via ORAL
  Filled 2017-05-20: qty 1

## 2017-05-20 NOTE — Discharge Instructions (Addendum)
IMPORTANT PATIENT INSTRUCTIONS:   You have been scheduled for an Outpatient Vascular Study at [facility]    If tomorrow is a Saturday or Sunday, please go to the [facility] Emergency Department registration desk at 8 AM tomorrow morning and tell them you are therefore a vascular study.  If tomorrow is a weekday (Monday - Friday), please go to the [facility] Department at 8 AM and tell them you are there for a vascular study  Take 650 mg of Tylenol every 6 hours for pain control.  Keep your leg elevated above the level of your heart.  Apply ice for 15 to 20 minutes 3-4 times to help with swelling and pain.  If the ultrasound tomorrow is negative, please call and schedule a follow-up appointment with your primary care provider regarding your ongoing pain and swelling in the left leg.  You have been given 1 dose of Xarelto in the emergency department.  If you develop worsening symptoms including shortness of breath, change in your vision, respiratory distress, or if you have a new fall or injury in the next few days, or other new concerning symptoms, please return to the emergency department for re-evaluation.

## 2017-05-21 ENCOUNTER — Other Ambulatory Visit: Payer: Self-pay | Admitting: Internal Medicine

## 2017-05-21 ENCOUNTER — Ambulatory Visit (INDEPENDENT_AMBULATORY_CARE_PROVIDER_SITE_OTHER): Payer: BLUE CROSS/BLUE SHIELD

## 2017-05-21 ENCOUNTER — Ambulatory Visit
Admission: RE | Admit: 2017-05-21 | Discharge: 2017-05-21 | Disposition: A | Discharge status: Home / Self Care | Payer: BLUE CROSS/BLUE SHIELD | Source: Ambulatory Visit | Attending: Internal Medicine | Admitting: Internal Medicine

## 2017-05-21 ENCOUNTER — Telehealth: Payer: Self-pay | Admitting: Internal Medicine

## 2017-05-21 ENCOUNTER — Encounter: Payer: Self-pay | Admitting: Internal Medicine

## 2017-05-21 ENCOUNTER — Ambulatory Visit: Payer: BLUE CROSS/BLUE SHIELD | Admitting: Internal Medicine

## 2017-05-21 VITALS — BP 142/80 | HR 90 | Temp 98.0°F | Ht 73.0 in | Wt 211.4 lb

## 2017-05-21 DIAGNOSIS — L0390 Cellulitis, unspecified: Secondary | ICD-10-CM

## 2017-05-21 DIAGNOSIS — R600 Localized edema: Secondary | ICD-10-CM | POA: Insufficient documentation

## 2017-05-21 DIAGNOSIS — M79605 Pain in left leg: Secondary | ICD-10-CM

## 2017-05-21 DIAGNOSIS — I10 Essential (primary) hypertension: Secondary | ICD-10-CM

## 2017-05-21 DIAGNOSIS — M79675 Pain in left toe(s): Secondary | ICD-10-CM

## 2017-05-21 DIAGNOSIS — S91202A Unspecified open wound of left great toe with damage to nail, initial encounter: Secondary | ICD-10-CM | POA: Diagnosis not present

## 2017-05-21 DIAGNOSIS — R0989 Other specified symptoms and signs involving the circulatory and respiratory systems: Secondary | ICD-10-CM

## 2017-05-21 DIAGNOSIS — M7989 Other specified soft tissue disorders: Principal | ICD-10-CM

## 2017-05-21 MED ORDER — DOXYCYCLINE HYCLATE 100 MG PO TABS: 100.0000 mg | ORAL_TABLET | Freq: Two times a day (BID) | ORAL | 0 refills | Status: DC

## 2017-05-21 MED ORDER — ONDANSETRON HCL 4 MG PO TABS: 4.0000 mg | ORAL_TABLET | Freq: Two times a day (BID) | ORAL | 0 refills | Status: DC | PRN

## 2017-05-21 MED ORDER — OXYCODONE-ACETAMINOPHEN 5-325 MG PO TABS: 1.0000 | ORAL_TABLET | Freq: Two times a day (BID) | ORAL | 0 refills | Status: DC | PRN

## 2017-05-21 NOTE — Progress Notes (Signed)
Pre visit review using our clinic review tool, if applicable. No additional management support is needed unless otherwise documented below in the visit note. 

## 2017-05-21 NOTE — Progress Notes (Addendum)
Chief Complaint  Patient presents with  . Follow-up    left leg px   F/u with significant other  1. C/o 1 month of left lower ext pain and swelling dx'ed blood DVT US left 4/11 +DVT  then 4/19 and 05/20/17 neg results. He saw vascular 05/17/17 who told pt ok to stop Aspirin 325 mg qd but he never picked Xarelto Rx 05/20/17 in the ED. Leg leg pain 8/10 size is stable since yesterday  2. Toenail fungus b/l toes with left great toenail dystrophic and abnormal at tip of toe significant other c/w infection and right toenail prev removed   Review of Systems  Constitutional: Negative for weight loss.  Cardiovascular: Positive for leg swelling.  Musculoskeletal:       +leg leg swelling and pain   Skin:       +left toe wound   Past Medical History:  Diagnosis Date  . CHF (congestive heart failure) ([place])   . Diabetes mellitus without complication ([place])   . DVT (deep venous thrombosis) ([place])   . GERD (gastroesophageal reflux disease)   . History of migraine    none in over 2 yrs  . Hyperlipidemia   . Hypertension   . Syncope and collapse    x1 - approx 2-3 yrs ago - dehydration   Past Surgical History:  Procedure Laterality Date  . COLONOSCOPY    . ESOPHAGEAL DILATION  09/24/2016   Procedure: ESOPHAGEAL DILATION;  Surgeon: Lucilla Lame, MD;  Location: [facility];  Service: Gastroenterology;;  . ESOPHAGOGASTRODUODENOSCOPY (EGD) WITH PROPOFOL N/A 04/08/2015   Procedure: ESOPHAGOGASTRODUODENOSCOPY (EGD) WITH PROPOFOL with dialation;  Surgeon: Lucilla Lame, MD;  Location: [facility];  Service: Endoscopy;  Laterality: N/A;  diabetic - oral meds  . ESOPHAGOGASTRODUODENOSCOPY (EGD) WITH PROPOFOL N/A 09/24/2016   Procedure: ESOPHAGOGASTRODUODENOSCOPY (EGD) WITH PROPOFOL WITH DILATION;  Surgeon: Lucilla Lame, MD;  Location: [facility];  Service: Gastroenterology;  Laterality: N/A;  Diabetic - oral meds  . FINGER AMPUTATION  2009   partial removal left index finger    Family  History  Problem Relation Age of Onset  . Heart disease Father        CAD  . Hypertension Father   . Diabetes Father   . Hypertension Sister   . Hypertension Brother   . Hypertension Brother   . Hypertension Mother    Social History   Socioeconomic History  . Marital status: Married    Spouse name: Not on file  . Number of children: Not on file  . Years of education: Not on file  . Highest education level: Not on file  Occupational History  . Not on file  Social Needs  . Financial resource strain: Not on file  . Food insecurity:    Worry: Not on file    Inability: Not on file  . Transportation needs:    Medical: Not on file    Non-medical: Not on file  Tobacco Use  . Smoking status: Current Every Day Smoker    Packs/day: 2.00    Years: 27.00    Pack years: 54.00    Types: Cigarettes  . Smokeless tobacco: Never Used  Substance and Sexual Activity  . Alcohol use: No  . Drug use: No  . Sexual activity: Not on file  Lifestyle  . Physical activity:    Days per week: Not on file    Minutes per session: Not on file  . Stress: Not on file  Relationships  .  Social connections:    Talks on phone: Not on file    Gets together: Not on file    Attends religious service: Not on file    Active member of club or organization: Not on file    Attends meetings of clubs or organizations: Not on file    Relationship status: Not on file  . Intimate partner violence:    Fear of current or ex partner: Not on file    Emotionally abused: Not on file    Physically abused: Not on file    Forced sexual activity: Not on file  Other Topics Concern  . Not on file  Social History Narrative  . Not on file   Current Meds  Medication Sig  . amLODipine (NORVASC) 10 MG tablet TAKE 1 TABLET(10 MG) BY MOUTH DAILY  . aspirin EC 325 MG tablet Take 1 tablet (325 mg total) by mouth daily.  Marland Kitchen buPROPion (WELLBUTRIN SR) 200 MG 12 hr tablet Take 200 mg by mouth 2 (two) times daily.  .  cyanocobalamin 1000 MCG tablet Take 100 mcg by mouth daily.  Marland Kitchen glucose blood (ONETOUCH VERIO) test strip Test sugars twice daily  . metroNIDAZOLE (FLAGYL) 500 MG tablet Take 1 tablet (500 mg total) by mouth 3 (three) times daily.  Glory Rosebush DELICA LANCETS 64Q MISC Test sugars twice daily  . pantoprazole (PROTONIX) 40 MG tablet Take 1 tablet (40 mg total) by mouth daily.   No Known Allergies Recent Results (from the past 2160 hour(s))  Hemoglobin A1c     Status: Abnormal   Collection Time: 02/23/17  3:35 PM  Result Value Ref Range   Hgb A1c MFr Bld 7.0 (H) 4.6 - 6.5 %    Comment: Glycemic Control Guidelines for People with Diabetes:Non Diabetic:  <6%Goal of Therapy: <7%Additional Action Suggested:  >8%   Microalbumin / creatinine urine ratio     Status: Abnormal   Collection Time: 02/23/17  3:35 PM  Result Value Ref Range   Microalb, Ur 2.8 (H) 0.0 - 1.9 mg/dL   Creatinine,U 97.1 mg/dL   Microalb Creat Ratio 2.9 0.0 - 30.0 mg/g  Comprehensive metabolic panel     Status: Abnormal   Collection Time: 02/23/17  3:35 PM  Result Value Ref Range   Sodium 139 135 - 145 mEq/L   Potassium 3.8 3.5 - 5.1 mEq/L   Chloride 104 96 - 112 mEq/L   CO2 25 19 - 32 mEq/L   Glucose, Bld 166 (H) 70 - 99 mg/dL   BUN 10 6 - 23 mg/dL   Creatinine, Ser 1.18 0.40 - 1.50 mg/dL   Total Bilirubin 0.6 0.2 - 1.2 mg/dL   Alkaline Phosphatase 112 39 - 117 U/L   AST 25 0 - 37 U/L   ALT 55 (H) 0 - 53 U/L   Total Protein 7.2 6.0 - 8.3 g/dL   Albumin 4.8 3.5 - 5.2 g/dL   Calcium 9.4 8.4 - 10.5 mg/dL   GFR 84.44 >60.00 mL/min  CBC with Differential/Platelet     Status: Abnormal   Collection Time: 02/23/17  3:35 PM  Result Value Ref Range   WBC 7.6 4.0 - 10.5 K/uL   RBC 4.63 4.22 - 5.81 Mil/uL   Hemoglobin 13.7 13.0 - 17.0 g/dL   HCT 38.7 (L) 39.0 - 52.0 %   MCV 83.6 78.0 - 100.0 fl   MCHC 35.5 30.0 - 36.0 g/dL   RDW 13.7 11.5 - 15.5 %   Platelets 303.0 150.0 - 400.0  K/uL   Neutrophils Relative % 66.5 43.0 -  77.0 %   Lymphocytes Relative 24.3 12.0 - 46.0 %   Monocytes Relative 6.6 3.0 - 12.0 %   Eosinophils Relative 1.5 0.0 - 5.0 %   Basophils Relative 1.1 0.0 - 3.0 %   Neutro Abs 5.0 1.4 - 7.7 K/uL   Lymphs Abs 1.8 0.7 - 4.0 K/uL   Monocytes Absolute 0.5 0.1 - 1.0 K/uL   Eosinophils Absolute 0.1 0.0 - 0.7 K/uL   Basophils Absolute 0.1 0.0 - 0.1 K/uL  CBG monitoring, ED     Status: Abnormal   Collection Time: 05/19/17 10:42 PM  Result Value Ref Range   Glucose-Capillary 166 (H) 65 - 99 mg/dL  I-stat chem 8, ed     Status: Abnormal   Collection Time: 05/19/17 11:57 PM  Result Value Ref Range   Sodium 141 135 - 145 mmol/L   Potassium 3.4 (L) 3.5 - 5.1 mmol/L   Chloride 104 101 - 111 mmol/L   BUN 6 6 - 20 mg/dL   Creatinine, Ser 0.90 0.61 - 1.24 mg/dL   Glucose, Bld 165 (H) 65 - 99 mg/dL   Calcium, Ion 1.12 (L) 1.15 - 1.40 mmol/L   TCO2 24 22 - 32 mmol/L   Hemoglobin 10.5 (L) 13.0 - 17.0 g/dL   HCT 31.0 (L) 39.0 - 52.0 %   Objective  Body mass index is 27.89 kg/m. Wt Readings from Last 3 Encounters:  05/21/17 211 lb 6.4 oz (95.9 kg)  05/17/17 206 lb (93.4 kg)  05/13/17 204 lb (92.5 kg)   Temp Readings from Last 3 Encounters:  05/21/17 98 F (36.7 C) (Oral)  05/19/17 98.7 F (37.1 C) (Oral)  05/13/17 98.1 F (36.7 C) (Oral)   BP Readings from Last 3 Encounters:  05/21/17 (!) 142/80  05/20/17 128/82  05/17/17 140/88   Pulse Readings from Last 3 Encounters:  05/21/17 90  05/20/17 73  05/17/17 98    Physical Exam  Constitutional: He is oriented to person, place, and time. He appears well-developed and well-nourished. He is cooperative.  HENT:  Head: Normocephalic and atraumatic.  Mouth/Throat: Oropharynx is clear and moist and mucous membranes are normal.  Eyes: Pupils are equal, round, and reactive to light. Conjunctivae are normal.  Cardiovascular: Normal rate, regular rhythm and normal heart sounds.  Significant left leg edema and pain   Pulmonary/Chest: Effort  normal and breath sounds normal.  Neurological: He is alert and oriented to person, place, and time. Gait normal.  Skin: Skin is warm, dry and intact.  Psychiatric: He has a normal mood and affect. His speech is normal and behavior is normal. Judgment and thought content normal. Cognition and memory are normal.  Nursing note and vitals reviewed.   Assessment   1. Left leg edema and pain ? Cellulitis from left toe wound vs DVT US left +4/11, neg 4/19 and 4/25  -unable to palpate DP/PT pulse  2. DM 2 A1C 7.0  3. HTN sl elevated could be 2/2 #1  Plan  1. Stat US left leg today [facility] r/o DVT rec after disc again case with radiology Will ask they doppler DP/PT pulses left at hospital  Xray left great toe  Pain control percocet  Nausea med prn  Doxy bid x 10 days  Pt needs to sch f/u with foot doctor in [place] asap   Reviewed [place] left leg negative for DVT  Xray cant r/o osteomyelitis will order MRI with and w/o contrast L  foot after speaking with radiology  Will move appt up with [facility] to next week with MD in office  05/25/17 left arterial doppler sch   2. Not taking Januvia 50 due to cost on metformin 500 mg bid  3. On norvasc 10  Consider change to ARB in future h/o DM  Repeat at f/u     Provider: Dr. Olivia Mackie McLean-Scocuzza-Internal Medicine

## 2017-05-21 NOTE — Patient Instructions (Addendum)
Please schedule follow up with foot doctor for left toe  Xray left toe today  [place] left leg today  Try Tylenol for pain max dose is 3000 total in 1 day Percocet has Tylenol in it too Take Doxycycline 2x per day with food   Cellulitis, Adult Cellulitis is a skin infection. The infected area is usually red and tender. This condition occurs most often in the arms and lower legs. The infection can travel to the muscles, blood, and underlying tissue and become serious. It is very important to get treated for this condition. What are the causes? Cellulitis is caused by bacteria. The bacteria enter through a break in the skin, such as a cut, burn, insect bite, open sore, or crack. What increases the risk? This condition is more likely to occur in people who:  Have a weak defense system (immune system).  Have open wounds on the skin such as cuts, burns, bites, and scrapes. Bacteria can enter the body through these open wounds.  Are older.  Have diabetes.  Have a type of long-lasting (chronic) liver disease (cirrhosis) or kidney disease.  Use IV drugs.  What are the signs or symptoms? Symptoms of this condition include:  Redness, streaking, or spotting on the skin.  Swollen area of the skin.  Tenderness or pain when an area of the skin is touched.  Warm skin.  Fever.  Chills.  Blisters.  How is this diagnosed? This condition is diagnosed based on a medical history and physical exam. You may also have tests, including:  Blood tests.  Lab tests.  Imaging tests.  How is this treated? Treatment for this condition may include:  Medicines, such as antibiotic medicines or antihistamines.  Supportive care, such as rest and application of cold or warm cloths (cold or warm compresses) to the skin.  Hospital care, if the condition is severe.  The infection usually gets better within 1-2 days of treatment. Follow these instructions at home:  Take over-the-counter and  prescription medicines only as told by your health care provider.  If you were prescribed an antibiotic medicine, take it as told by your health care provider. Do not stop taking the antibiotic even if you start to feel better.  Drink enough fluid to keep your urine clear or pale yellow.  Do not touch or rub the infected area.  Raise (elevate) the infected area above the level of your heart while you are sitting or lying down.  Apply warm or cold compresses to the affected area as told by your health care provider.  Keep all follow-up visits as told by your health care provider. This is important. These visits let your health care provider make sure a more serious infection is not developing. Contact a health care provider if:  You have a fever.  Your symptoms do not improve within 1-2 days of starting treatment.  Your bone or joint underneath the infected area becomes painful after the skin has healed.  Your infection returns in the same area or another area.  You notice a swollen bump in the infected area.  You develop new symptoms.  You have a general ill feeling (malaise) with muscle aches and pains. Get help right away if:  Your symptoms get worse.  You feel very sleepy.  You develop vomiting or diarrhea that persists.  You notice red streaks coming from the infected area.  Your red area gets larger or turns dark in color. This information is not intended to replace advice  given to you by your health care provider. Make sure you discuss any questions you have with your health care provider. Document Released: 10/22/2004 Document Revised: 05/23/2015 Document Reviewed: 11/21/2014 Elsevier Interactive Patient Education  [employer].

## 2017-05-21 NOTE — Telephone Encounter (Signed)
Kim from [facility] with stat results from venous doppler of left leg: negative for DVT. Results also in Epic.  Jacob Baker also states pt was able to  be was scheduled for arterial study ABI on Tuesday, 05/25/17 at 2:30pm

## 2017-05-21 NOTE — Progress Notes (Signed)
Reviewed [place] left leg negative for DVT Xray cant r/o osteomyelitis will order MRI with and w/o contrast after speaking with radiology  Will move appt up with [facility] to next week with MD in office   [place]

## 2017-05-24 ENCOUNTER — Ambulatory Visit
Admission: RE | Admit: 2017-05-24 | Discharge: 2017-05-24 | Disposition: A | Discharge status: Home / Self Care | Payer: BLUE CROSS/BLUE SHIELD | Source: Ambulatory Visit | Attending: Internal Medicine | Admitting: Internal Medicine

## 2017-05-24 DIAGNOSIS — M7989 Other specified soft tissue disorders: Secondary | ICD-10-CM | POA: Insufficient documentation

## 2017-05-24 DIAGNOSIS — M79675 Pain in left toe(s): Secondary | ICD-10-CM | POA: Diagnosis not present

## 2017-05-24 DIAGNOSIS — M19072 Primary osteoarthritis, left ankle and foot: Secondary | ICD-10-CM | POA: Diagnosis not present

## 2017-05-24 MED ORDER — GADOBENATE DIMEGLUMINE 529 MG/ML IV SOLN
20.0000 mL | Freq: Once | INTRAVENOUS | Status: AC | PRN
  Administered 2017-05-24: 20 mL via INTRAVENOUS

## 2017-05-25 ENCOUNTER — Ambulatory Visit: Payer: BLUE CROSS/BLUE SHIELD

## 2017-05-25 ENCOUNTER — Encounter (INDEPENDENT_AMBULATORY_CARE_PROVIDER_SITE_OTHER): Payer: BLUE CROSS/BLUE SHIELD | Admitting: Vascular Surgery

## 2017-06-14 ENCOUNTER — Encounter (INDEPENDENT_AMBULATORY_CARE_PROVIDER_SITE_OTHER): Payer: Self-pay | Admitting: Vascular Surgery

## 2017-06-16 ENCOUNTER — Ambulatory Visit (INDEPENDENT_AMBULATORY_CARE_PROVIDER_SITE_OTHER): Payer: BLUE CROSS/BLUE SHIELD | Admitting: Vascular Surgery

## 2017-06-16 ENCOUNTER — Encounter (INDEPENDENT_AMBULATORY_CARE_PROVIDER_SITE_OTHER): Payer: BLUE CROSS/BLUE SHIELD

## 2017-06-22 DIAGNOSIS — F331 Major depressive disorder, recurrent, moderate: Secondary | ICD-10-CM | POA: Diagnosis not present

## 2017-07-27 ENCOUNTER — Ambulatory Visit: Payer: BLUE CROSS/BLUE SHIELD | Admitting: Gastroenterology

## 2017-07-27 ENCOUNTER — Other Ambulatory Visit: Payer: Self-pay

## 2017-07-27 ENCOUNTER — Encounter: Payer: Self-pay | Admitting: Gastroenterology

## 2017-07-27 VITALS — BP 129/80 | HR 94 | Ht 73.0 in | Wt 206.2 lb

## 2017-07-27 DIAGNOSIS — R194 Change in bowel habit: Secondary | ICD-10-CM

## 2017-07-27 NOTE — Progress Notes (Signed)
Primary Care Physician: [name], MD  Primary Gastroenterologist:  Dr. Lucilla Lame  Chief Complaint  Patient presents with  . Constipation    HPI: Jacob Baker is a 47 y.o. male here For a change in bowel habits.  The patient reports that in the last 3 months he has had a worsening of his bowel movements with constipation.  The patient states that he has to strain to move his bowels.  There is no report of any black stools or bloody stools.  The patient does have a 6 pound weight loss that he states he has not been trying to lose weight.  The patient had a colonoscopy' 4 years ago with an adenomatous polyp that was told to have a repeat colonoscopy in 5 years.  The patient states that when he does move his bowels they can be hard or soft.  Current Outpatient Medications  Medication Sig Dispense Refill  . amLODipine (NORVASC) 10 MG tablet TAKE 1 TABLET(10 MG) BY MOUTH DAILY 30 tablet 5  . aspirin EC 325 MG tablet Take 1 tablet (325 mg total) by mouth daily. 100 tablet 3  . buPROPion (WELLBUTRIN SR) 200 MG 12 hr tablet Take 200 mg by mouth 2 (two) times daily.    . cyanocobalamin 1000 MCG tablet Take 100 mcg by mouth daily.    Marland Kitchen glucose blood (ONETOUCH VERIO) test strip Test sugars twice daily 50 each 6  . metFORMIN (GLUCOPHAGE) 500 MG tablet Take 500 mg by mouth 2 (two) times daily with a meal.    . ONETOUCH DELICA LANCETS 53Z MISC Test sugars twice daily 50 each 3  . pantoprazole (PROTONIX) 40 MG tablet Take 1 tablet (40 mg total) by mouth daily. 30 tablet 6  . ondansetron (ZOFRAN) 4 MG tablet Take 1 tablet (4 mg total) by mouth 2 (two) times daily as needed for nausea or vomiting. (Patient not taking: Reported on 07/27/2017) 20 tablet 0  . oxyCODONE-acetaminophen (PERCOCET) 5-325 MG tablet Take 1 tablet by mouth 2 (two) times daily as needed for severe pain. (Patient not taking: Reported on 07/27/2017) 10 tablet 0   No current facility-administered medications for this visit.      Allergies as of 07/27/2017  . (No Known Allergies)    ROS:  General: Negative for anorexia, weight loss, fever, chills, fatigue, weakness. ENT: Negative for hoarseness, difficulty swallowing , nasal congestion. CV: Negative for chest pain, angina, palpitations, dyspnea on exertion, peripheral edema.  Respiratory: Negative for dyspnea at rest, dyspnea on exertion, cough, sputum, wheezing.  GI: See history of present illness. GU:  Negative for dysuria, hematuria, urinary incontinence, urinary frequency, nocturnal urination.  Endo: Negative for unusual weight change.    Physical Examination:   BP 129/80   Pulse 94   Ht 6\' 1"  (1.854 m)   Wt 206 lb 3.2 oz (93.5 kg)   BMI 27.20 kg/m   General: Well-nourished, well-developed in no acute distress.  Eyes: No icterus. Conjunctivae pink. Mouth: Oropharyngeal mucosa moist and pink , no lesions erythema or exudate. Lungs: Clear to auscultation bilaterally. Non-labored. Heart: Regular rate and rhythm, no murmurs rubs or gallops.  Abdomen: Bowel sounds are normal, nontender, nondistended, no hepatosplenomegaly or masses, no abdominal bruits or hernia , no rebound or guarding.   Extremities: No lower extremity edema. No clubbing or deformities. Neuro: Alert and oriented x 3.  Grossly intact. Skin: Warm and dry, no jaundice.   Psych: Alert and cooperative, normal mood and affect.  Labs:    Imaging Studies: No results found.  Assessment and Plan:   Jacob Baker is a 47 y.o. y/o male who comes in today with a history of 3 months of constipation.  The patient states that he will sit down and try to the bathroom and push and strain without much results and then sometime later will have to return to the bathroom or noticed some soiling.  The patient does have a history of adenomatous polyps in the past and has been treated in the past for H. Pylori.  The patient will be set up for repeat colonoscopy and has been told to increase fiber in  his diet by taking Citrucel once a day.  The patient and his wife have been explained the plan and agree with it.    Lucilla Lame, MD. Marval Regal   Note: This dictation was prepared with Dragon dictation along with smaller phrase technology. Any transcriptional errors that result from this process are unintentional.

## 2017-07-28 ENCOUNTER — Other Ambulatory Visit: Payer: Self-pay

## 2017-07-28 DIAGNOSIS — R194 Change in bowel habit: Secondary | ICD-10-CM

## 2017-08-02 ENCOUNTER — Encounter: Payer: Self-pay | Admitting: *Deleted

## 2017-08-02 ENCOUNTER — Other Ambulatory Visit: Payer: Self-pay

## 2017-08-05 NOTE — Anesthesia Preprocedure Evaluation (Addendum)
Anesthesia Evaluation  Patient identified by MRN, date of birth, ID band Patient awake    Reviewed: Allergy & Precautions, NPO status , Patient's Chart, lab work & pertinent test results  History of Anesthesia Complications Negative for: history of anesthetic complications  Airway Mallampati: III  TM Distance: >3 FB Neck ROM: Full    Dental  (+)    Pulmonary Current Smoker,    Pulmonary exam normal breath sounds clear to auscultation       Cardiovascular Exercise Tolerance: Good hypertension, +CHF and + DVT (LLE 05/06/17)  Normal cardiovascular exam Rhythm:Regular Rate:Normal     Neuro/Psych PSYCHIATRIC DISORDERS Anxiety negative neurological ROS     GI/Hepatic hiatal hernia, GERD  ,Esophageal stricture/stenosis   Endo/Other  diabetes, Type 2  Renal/GU negative Renal ROS     Musculoskeletal   Abdominal   Peds  Hematology negative hematology ROS (+)   Anesthesia Other Findings   Reproductive/Obstetrics                            Anesthesia Physical  Anesthesia Plan  ASA: III  Anesthesia Plan: General   Post-op Pain Management:    Induction: Intravenous  PONV Risk Score and Plan: 2 and Ondansetron and Propofol infusion  Airway Management Planned: Natural Airway  Additional Equipment:   Intra-op Plan:   Post-operative Plan:   Informed Consent: I have reviewed the patients History and Physical, chart, labs and discussed the procedure including the risks, benefits and alternatives for the proposed anesthesia with the patient or authorized representative who has indicated his/her understanding and acceptance.     Plan Discussed with: CRNA  Anesthesia Plan Comments:         Anesthesia Quick Evaluation

## 2017-08-05 NOTE — Discharge Instructions (Signed)
General Anesthesia, Adult, Care After °These instructions provide you with information about caring for yourself after your procedure. Your health care provider may also give you more specific instructions. Your treatment has been planned according to current medical practices, but problems sometimes occur. Call your health care provider if you have any problems or questions after your procedure. °What can I expect after the procedure? °After the procedure, it is common to have: °· Vomiting. °· A sore throat. °· Mental slowness. ° °It is common to feel: °· Nauseous. °· Cold or shivery. °· Sleepy. °· Tired. °· Sore or achy, even in parts of your body where you did not have surgery. ° °Follow these instructions at home: °For at least 24 hours after the procedure: °· Do not: °? Participate in activities where you could fall or become injured. °? Drive. °? Use heavy machinery. °? Drink alcohol. °? Take sleeping pills or medicines that cause drowsiness. °? Make important decisions or sign legal documents. °? Take care of children on your own. °· Rest. °Eating and drinking °· If you vomit, drink water, juice, or soup when you can drink without vomiting. °· Drink enough fluid to keep your urine clear or pale yellow. °· Make sure you have little or no nausea before eating solid foods. °· Follow the diet recommended by your health care provider. °General instructions °· Have a responsible adult stay with you until you are awake and alert. °· Return to your normal activities as told by your health care provider. Ask your health care provider what activities are safe for you. °· Take over-the-counter and prescription medicines only as told by your health care provider. °· If you smoke, do not smoke without supervision. °· Keep all follow-up visits as told by your health care provider. This is important. °Contact a health care provider if: °· You continue to have nausea or vomiting at home, and medicines are not helpful. °· You  cannot drink fluids or start eating again. °· You cannot urinate after 8-12 hours. °· You develop a skin rash. °· You have fever. °· You have increasing redness at the site of your procedure. °Get help right away if: °· You have difficulty breathing. °· You have chest pain. °· You have unexpected bleeding. °· You feel that you are having a life-threatening or urgent problem. °This information is not intended to replace advice given to you by your health care provider. Make sure you discuss any questions you have with your health care provider. °Document Released: 04/20/2000 Document Revised: 06/17/2015 Document Reviewed: 12/27/2014 °Elsevier Interactive Patient Education © 2018 [employer] ° °

## 2017-08-06 ENCOUNTER — Ambulatory Visit
Admission: RE | Admit: 2017-08-06 | Discharge: 2017-08-06 | Disposition: A | Discharge status: Home / Self Care | Payer: BLUE CROSS/BLUE SHIELD | Source: Ambulatory Visit | Attending: Gastroenterology | Admitting: Gastroenterology

## 2017-08-06 ENCOUNTER — Ambulatory Visit: Payer: BLUE CROSS/BLUE SHIELD | Admitting: Anesthesiology

## 2017-08-06 ENCOUNTER — Encounter
Admission: RE | Disposition: A | Discharge status: Home / Self Care | Payer: Self-pay | Source: Ambulatory Visit | Attending: Gastroenterology

## 2017-08-06 DIAGNOSIS — K5904 Chronic idiopathic constipation: Secondary | ICD-10-CM

## 2017-08-06 DIAGNOSIS — Z79899 Other long term (current) drug therapy: Secondary | ICD-10-CM | POA: Diagnosis not present

## 2017-08-06 DIAGNOSIS — Z7984 Long term (current) use of oral hypoglycemic drugs: Secondary | ICD-10-CM | POA: Diagnosis not present

## 2017-08-06 DIAGNOSIS — E119 Type 2 diabetes mellitus without complications: Secondary | ICD-10-CM | POA: Diagnosis not present

## 2017-08-06 DIAGNOSIS — E785 Hyperlipidemia, unspecified: Secondary | ICD-10-CM | POA: Insufficient documentation

## 2017-08-06 DIAGNOSIS — K621 Rectal polyp: Secondary | ICD-10-CM | POA: Diagnosis not present

## 2017-08-06 DIAGNOSIS — K635 Polyp of colon: Secondary | ICD-10-CM

## 2017-08-06 DIAGNOSIS — F419 Anxiety disorder, unspecified: Secondary | ICD-10-CM | POA: Diagnosis not present

## 2017-08-06 DIAGNOSIS — Z7982 Long term (current) use of aspirin: Secondary | ICD-10-CM | POA: Diagnosis not present

## 2017-08-06 DIAGNOSIS — I509 Heart failure, unspecified: Secondary | ICD-10-CM | POA: Diagnosis not present

## 2017-08-06 DIAGNOSIS — K5900 Constipation, unspecified: Secondary | ICD-10-CM | POA: Insufficient documentation

## 2017-08-06 DIAGNOSIS — F17210 Nicotine dependence, cigarettes, uncomplicated: Secondary | ICD-10-CM | POA: Insufficient documentation

## 2017-08-06 DIAGNOSIS — K5140 Inflammatory polyps of colon without complications: Secondary | ICD-10-CM | POA: Diagnosis not present

## 2017-08-06 DIAGNOSIS — R194 Change in bowel habit: Secondary | ICD-10-CM | POA: Diagnosis not present

## 2017-08-06 DIAGNOSIS — I110 Hypertensive heart disease with heart failure: Secondary | ICD-10-CM | POA: Insufficient documentation

## 2017-08-06 DIAGNOSIS — D125 Benign neoplasm of sigmoid colon: Secondary | ICD-10-CM | POA: Diagnosis not present

## 2017-08-06 DIAGNOSIS — K219 Gastro-esophageal reflux disease without esophagitis: Secondary | ICD-10-CM | POA: Insufficient documentation

## 2017-08-06 DIAGNOSIS — Z8249 Family history of ischemic heart disease and other diseases of the circulatory system: Secondary | ICD-10-CM | POA: Insufficient documentation

## 2017-08-06 DIAGNOSIS — Z86718 Personal history of other venous thrombosis and embolism: Secondary | ICD-10-CM | POA: Insufficient documentation

## 2017-08-06 DIAGNOSIS — K5909 Other constipation: Secondary | ICD-10-CM | POA: Diagnosis not present

## 2017-08-06 HISTORY — PX: COLONOSCOPY WITH PROPOFOL: SHX5780

## 2017-08-06 HISTORY — PX: POLYPECTOMY: SHX149

## 2017-08-06 LAB — GLUCOSE, CAPILLARY
GLUCOSE-CAPILLARY: 229 mg/dL — AB (ref 70–99)
Glucose-Capillary: 219 mg/dL — ABNORMAL HIGH (ref 70–99)

## 2017-08-06 SURGERY — COLONOSCOPY WITH PROPOFOL
Anesthesia: General | Wound class: Contaminated

## 2017-08-06 MED ORDER — PROPOFOL 10 MG/ML IV BOLUS
INTRAVENOUS | Status: DC | PRN
  Administered 2017-08-06: 80 mg via INTRAVENOUS
  Administered 2017-08-06: 20 mg via INTRAVENOUS
  Administered 2017-08-06: 30 mg via INTRAVENOUS
  Administered 2017-08-06: 20 mg via INTRAVENOUS
  Administered 2017-08-06: 80 mg via INTRAVENOUS
  Administered 2017-08-06: 40 mg via INTRAVENOUS
  Administered 2017-08-06: 20 mg via INTRAVENOUS

## 2017-08-06 MED ORDER — ONDANSETRON HCL 4 MG/2ML IJ SOLN: 4.0000 mg | Freq: Once | INTRAMUSCULAR | Status: DC | PRN

## 2017-08-06 MED ORDER — LACTATED RINGERS IV SOLN
INTRAVENOUS | Status: DC | PRN
  Administered 2017-08-06: 08:00:00 via INTRAVENOUS

## 2017-08-06 MED ORDER — LIDOCAINE HCL (CARDIAC) PF 100 MG/5ML IV SOSY
PREFILLED_SYRINGE | INTRAVENOUS | Status: DC | PRN
  Administered 2017-08-06: 30 mg via INTRAVENOUS

## 2017-08-06 MED ORDER — ACETAMINOPHEN 325 MG PO TABS: 650.0000 mg | ORAL_TABLET | Freq: Once | ORAL | Status: DC | PRN

## 2017-08-06 MED ORDER — ACETAMINOPHEN 160 MG/5ML PO SOLN: 325.0000 mg | ORAL | Status: DC | PRN

## 2017-08-06 SURGICAL SUPPLY — 24 items
CANISTER SUCT 1200ML W/VALVE (MISCELLANEOUS) ×3 IMPLANT
CLIP HMST 235XBRD CATH ROT (MISCELLANEOUS) IMPLANT
CLIP RESOLUTION 360 11X235 (MISCELLANEOUS)
ELECT REM PT RETURN 9FT ADLT (ELECTROSURGICAL)
ELECTRODE REM PT RTRN 9FT ADLT (ELECTROSURGICAL) IMPLANT
FCP ESCP3.2XJMB 240X2.8X (MISCELLANEOUS)
FORCEPS BIOP RAD 4 LRG CAP 4 (CUTTING FORCEPS) ×3 IMPLANT
FORCEPS BIOP RJ4 240 W/NDL (MISCELLANEOUS)
FORCEPS ESCP3.2XJMB 240X2.8X (MISCELLANEOUS) IMPLANT
GOWN CVR UNV OPN BCK APRN NK (MISCELLANEOUS) ×4 IMPLANT
GOWN ISOL THUMB LOOP REG UNIV (MISCELLANEOUS) ×2
INJECTOR VARIJECT VIN23 (MISCELLANEOUS) IMPLANT
KIT DEFENDO VALVE AND CONN (KITS) IMPLANT
KIT ENDO PROCEDURE OLY (KITS) ×3 IMPLANT
MARKER SPOT ENDO TATTOO 5ML (MISCELLANEOUS) IMPLANT
PROBE APC STR FIRE (PROBE) IMPLANT
RETRIEVER NET ROTH 2.5X230 LF (MISCELLANEOUS) IMPLANT
SNARE SHORT THROW 13M SML OVAL (MISCELLANEOUS) ×3 IMPLANT
SNARE SHORT THROW 30M LRG OVAL (MISCELLANEOUS) IMPLANT
SNARE SNG USE RND 15MM (INSTRUMENTS) IMPLANT
SPOT EX ENDOSCOPIC TATTOO (MISCELLANEOUS)
TRAP ETRAP POLY (MISCELLANEOUS) ×3 IMPLANT
VARIJECT INJECTOR VIN23 (MISCELLANEOUS)
WATER STERILE IRR 250ML POUR (IV SOLUTION) ×3 IMPLANT

## 2017-08-06 NOTE — Anesthesia Postprocedure Evaluation (Signed)
Anesthesia Post Note  Patient: Jacob Baker  Procedure(s) Performed: COLONOSCOPY WITH PROPOFOL (N/A ) POLYPECTOMY INTESTINAL  Patient location during evaluation: PACU Anesthesia Type: General Level of consciousness: awake and alert, oriented and patient cooperative Pain management: pain level controlled Vital Signs Assessment: post-procedure vital signs reviewed and stable Respiratory status: spontaneous breathing, nonlabored ventilation and respiratory function stable Cardiovascular status: blood pressure returned to baseline and stable Postop Assessment: adequate PO intake Anesthetic complications: no    Darrin Nipper

## 2017-08-06 NOTE — Transfer of Care (Signed)
Immediate Anesthesia Transfer of Care Note  Patient: Jacob Baker  Procedure(s) Performed: COLONOSCOPY WITH PROPOFOL (N/A ) POLYPECTOMY INTESTINAL  Patient Location: PACU  Anesthesia Type: General  Level of Consciousness: awake, alert  and patient cooperative  Airway and Oxygen Therapy: Patient Spontanous Breathing and Patient connected to supplemental oxygen  Post-op Assessment: Post-op Vital signs reviewed, Patient's Cardiovascular Status Stable, Respiratory Function Stable, Patent Airway and No signs of Nausea or vomiting  Post-op Vital Signs: Reviewed and stable  Complications: No apparent anesthesia complications

## 2017-08-06 NOTE — Anesthesia Procedure Notes (Signed)
Procedure Name: MAC Date/Time: 08/06/2017 8:25 AM Performed by: Lind Guest, CRNA Pre-anesthesia Checklist: Patient identified, Emergency Drugs available, Suction available, Patient being monitored and Timeout performed Patient Re-evaluated:Patient Re-evaluated prior to induction Oxygen Delivery Method: Nasal cannula

## 2017-08-06 NOTE — Op Note (Signed)
[facility] Patient Name: Jacob Baker Procedure Date: 08/06/2017 8:16 AM MRN: [id number] Account #: [id number] Date of Birth: 11-20-1970 Admit Type: Outpatient Age: 47 Room: [facility] OR ROOM 01 Gender: Male Note Status: Finalized Procedure:            Colonoscopy Indications:          Change in bowel habits, Constipation Providers:            Lucilla Lame MD, MD Referring MD:         Deborra Medina, MD (Referring MD) Medicines:            Propofol per Anesthesia Complications:        No immediate complications. Procedure:            Pre-Anesthesia Assessment:                       - Prior to the procedure, a History and Physical was                        performed, and patient medications and allergies were                        reviewed. The patient's tolerance of previous                        anesthesia was also reviewed. The risks and benefits of                        the procedure and the sedation options and risks were                        discussed with the patient. All questions were                        answered, and informed consent was obtained. Prior                        Anticoagulants: The patient has taken no previous                        anticoagulant or antiplatelet agents. ASA Grade                        Assessment: II - A patient with mild systemic disease.                        After reviewing the risks and benefits, the patient was                        deemed in satisfactory condition to undergo the                        procedure.                       After obtaining informed consent, the colonoscope was                        passed under direct vision. Throughout the procedure,  the patient's blood pressure, pulse, and oxygen                        saturations were monitored continuously. The was                        introduced through the anus and advanced to the the                         cecum, identified by appendiceal orifice and ileocecal                        valve. The colonoscopy was performed without                        difficulty. The patient tolerated the procedure well.                        The quality of the bowel preparation was excellent. Findings:      The perianal and digital rectal examinations were normal.      A 7 mm polyp was found in the rectum. The polyp was pedunculated. The       polyp was removed with a cold snare. Resection and retrieval were       complete.      Three sessile polyps were found in the sigmoid colon. The polyps were 2       to 3 mm in size. These polyps were removed with a cold biopsy forceps.       Resection and retrieval were complete.      A 6 mm polyp was found in the sigmoid colon. The polyp was sessile. The       polyp was removed with a cold snare. Resection and retrieval were       complete. Impression:           - One 7 mm polyp in the rectum, removed with a cold                        snare. Resected and retrieved.                       - Three 2 to 3 mm polyps in the sigmoid colon, removed                        with a cold biopsy forceps. Resected and retrieved.                       - One 6 mm polyp in the sigmoid colon, removed with a                        cold snare. Resected and retrieved. Recommendation:       - Discharge patient to home.                       - Resume previous diet.                       - Continue present medications.                       -  Await pathology results.                       - Repeat colonoscopy in 5 years if polyp adenoma and 10                        years if hyperplastic Procedure Code(s):    --- Professional ---                       [phone], Colonoscopy, flexible; with removal of tumor(s),                        polyp(s), or other lesion(s) by snare technique                       45380, 59, Colonoscopy, flexible; with biopsy, single                        or  multiple Diagnosis Code(s):    --- Professional ---                       K59.00, Constipation, unspecified                       R19.4, Change in bowel habit                       D12.5, Benign neoplasm of sigmoid colon                       K62.1, Rectal polyp CPT copyright 2017 American Medical Association. All rights reserved. The codes documented in this report are preliminary and upon coder review may  be revised to meet current compliance requirements. Lucilla Lame MD, MD 08/06/2017 8:43:25 AM This report has been signed electronically. Number of Addenda: 0 Note Initiated On: 08/06/2017 8:16 AM Scope Withdrawal Time: 0 hours 9 minutes 51 seconds  Total Procedure Duration: 0 hours 14 minutes 16 seconds       [facility]

## 2017-08-06 NOTE — H&P (Signed)
Jacob Lame, MD [address] [place], [place] [address] [phone] Fax : [phone]  Primary Care Physician:  [name], MD Primary Gastroenterologist:  Dr. Allen Norris  Pre-Procedure History & Physical: HPI:  GENERAL WEARING is a 47 y.o. male is here for an colonoscopy.   Past Medical History:  Diagnosis Date  . CHF (congestive heart failure) ([place])   . Diabetes mellitus without complication ([place])   . DVT (deep venous thrombosis) ([place]) 04/2017  . GERD (gastroesophageal reflux disease)   . History of migraine    none in over 2 yrs  . Hyperlipidemia   . Hypertension   . Syncope and collapse    x1 - approx 2-3 yrs ago - dehydration    Past Surgical History:  Procedure Laterality Date  . COLONOSCOPY    . ESOPHAGEAL DILATION  09/24/2016   Procedure: ESOPHAGEAL DILATION;  Surgeon: Jacob Lame, MD;  Location: [facility];  Service: Gastroenterology;;  . ESOPHAGOGASTRODUODENOSCOPY (EGD) WITH PROPOFOL N/A 04/08/2015   Procedure: ESOPHAGOGASTRODUODENOSCOPY (EGD) WITH PROPOFOL with dialation;  Surgeon: Jacob Lame, MD;  Location: [facility];  Service: Endoscopy;  Laterality: N/A;  diabetic - oral meds  . ESOPHAGOGASTRODUODENOSCOPY (EGD) WITH PROPOFOL N/A 09/24/2016   Procedure: ESOPHAGOGASTRODUODENOSCOPY (EGD) WITH PROPOFOL WITH DILATION;  Surgeon: Jacob Lame, MD;  Location: [facility];  Service: Gastroenterology;  Laterality: N/A;  Diabetic - oral meds  . FINGER AMPUTATION  2009   partial removal left index finger     Prior to Admission medications   Medication Sig Start Date End Date Taking? Authorizing Provider  amLODipine (NORVASC) 10 MG tablet TAKE 1 TABLET(10 MG) BY MOUTH DAILY 02/22/17  Yes [name], MD  buPROPion [facility] SR) 200 MG 12 hr tablet Take 200 mg by mouth 2 (two) times daily.   Yes [provider]  glucose blood (ONETOUCH VERIO) test strip Test sugars twice daily 04/26/14  Yes Phadke, Radhika P, MD   metFORMIN (GLUCOPHAGE) 500 MG tablet Take 500 mg by mouth 2 (two) times daily with a meal.   Yes [provider]  ONETOUCH DELICA LANCETS 65K MISC Test sugars twice daily 04/26/14  Yes Phadke, Radhika P, MD  pantoprazole (PROTONIX) 40 MG tablet Take 1 tablet (40 mg total) by mouth daily. 12/16/16  Yes Jacob Lame, MD  aspirin EC 325 MG tablet Take 1 tablet (325 mg total) by mouth daily. 05/06/17 05/06/18  Earleen Newport, MD  cyanocobalamin 1000 MCG tablet Take 100 mcg by mouth daily.    [provider]  ondansetron (ZOFRAN) 4 MG tablet Take 1 tablet (4 mg total) by mouth 2 (two) times daily as needed for nausea or vomiting. Patient not taking: Reported on 07/27/2017 05/21/17   McLean-Scocuzza, Nino Glow, MD  oxyCODONE-acetaminophen (PERCOCET) 5-325 MG tablet Take 1 tablet by mouth 2 (two) times daily as needed for severe pain. Patient not taking: Reported on 07/27/2017 05/21/17   McLean-Scocuzza, Nino Glow, MD    Allergies as of 07/28/2017  . (No Known Allergies)    Family History  Problem Relation Age of Onset  . Heart disease Father        CAD  . Hypertension Father   . Diabetes Father   . Hypertension Sister   . Hypertension Brother   . Hypertension Brother   . Hypertension Mother     Social History   Socioeconomic History  . Marital status: Married    Spouse name: Not on file  . Number of  children: Not on file  . Years of education: Not on file  . Highest education level: Not on file  Occupational History  . Not on file  Social Needs  . Financial resource strain: Not on file  . Food insecurity:    Worry: Not on file    Inability: Not on file  . Transportation needs:    Medical: Not on file    Non-medical: Not on file  Tobacco Use  . Smoking status: Current Every Day Smoker    Packs/day: 2.00    Years: 27.00    Pack years: 54.00    Types: Cigarettes  . Smokeless tobacco: Never Used  Substance and Sexual Activity  . Alcohol use: No  . Drug use: No   . Sexual activity: Not on file  Lifestyle  . Physical activity:    Days per week: Not on file    Minutes per session: Not on file  . Stress: Not on file  Relationships  . Social connections:    Talks on phone: Not on file    Gets together: Not on file    Attends religious service: Not on file    Active member of club or organization: Not on file    Attends meetings of clubs or organizations: Not on file    Relationship status: Not on file  . Intimate partner violence:    Fear of current or ex partner: Not on file    Emotionally abused: Not on file    Physically abused: Not on file    Forced sexual activity: Not on file  Other Topics Concern  . Not on file  Social History Narrative  . Not on file    Review of Systems: See HPI, otherwise negative ROS  Physical Exam: BP (!) 135/93   Pulse 74   Temp 98.1 F (36.7 C) (Temporal)   Resp 17   Ht 6\' 1"  (1.854 m)   Wt 202 lb (91.6 kg)   SpO2 100%   BMI 26.65 kg/m  General:   Alert,  pleasant and cooperative in NAD Head:  Normocephalic and atraumatic. Neck:  Supple; no masses or thyromegaly. Lungs:  Clear throughout to auscultation.    Heart:  Regular rate and rhythm. Abdomen:  Soft, nontender and nondistended. Normal bowel sounds, without guarding, and without rebound.   Neurologic:  Alert and  oriented x4;  grossly normal neurologically.  Impression/Plan: [name] is here for an colonoscopy to be performed for constipation  Risks, benefits, limitations, and alternatives regarding  colonoscopy have been reviewed with the patient.  Questions have been answered.  All parties agreeable.   Jacob Lame, MD  08/06/2017, 8:19 AM

## 2017-08-09 ENCOUNTER — Encounter: Payer: Self-pay | Admitting: Gastroenterology

## 2017-08-11 ENCOUNTER — Encounter: Payer: Self-pay | Admitting: Gastroenterology

## 2017-08-30 ENCOUNTER — Ambulatory Visit (INDEPENDENT_AMBULATORY_CARE_PROVIDER_SITE_OTHER): Payer: BLUE CROSS/BLUE SHIELD | Admitting: Vascular Surgery

## 2017-08-30 ENCOUNTER — Encounter (INDEPENDENT_AMBULATORY_CARE_PROVIDER_SITE_OTHER): Payer: BLUE CROSS/BLUE SHIELD

## 2017-08-30 ENCOUNTER — Encounter

## 2017-08-31 ENCOUNTER — Other Ambulatory Visit: Payer: Self-pay | Admitting: Endocrinology

## 2017-08-31 ENCOUNTER — Other Ambulatory Visit: Payer: Self-pay | Admitting: Internal Medicine

## 2017-09-02 ENCOUNTER — Other Ambulatory Visit: Payer: Self-pay | Admitting: Gastroenterology

## 2017-09-15 ENCOUNTER — Encounter (INDEPENDENT_AMBULATORY_CARE_PROVIDER_SITE_OTHER): Payer: Self-pay | Admitting: Vascular Surgery

## 2017-09-23 ENCOUNTER — Telehealth: Payer: Self-pay

## 2017-09-23 NOTE — Telephone Encounter (Signed)
Pt calls this am because he noted blood/clots when he wiped yesterday. States he is not constipated and is not aware of any hemorroids. This was the only episode of bleeding. He did also state that last Friday he noted rectal pain, especially when he coughed. No bleeding at that time.

## 2017-10-06 ENCOUNTER — Telehealth: Payer: Self-pay

## 2017-10-06 NOTE — Telephone Encounter (Signed)
Copied from [place] [phone]. Topic: Appointment Scheduling - Scheduling Inquiry for Clinic >> Oct 06, 2017 10:02 AM Sheran Luz wrote: Reason for CRM: Pt called to schedule an appointment with Dr. Derrel Nip for his annual physical but she did not have any openings until late October. Pt states that he needs something by the end of this month if possible due to his job. Pt would like to know if there is any way that he could be fit in the Sept schedule. Please advise.   Pt is requesting a call back at [phone]

## 2017-10-06 NOTE — Telephone Encounter (Signed)
Would it be okay to schedule pt in an 11:30 or 4pm slot for a physical this month? No availability until late October.

## 2017-10-06 NOTE — Telephone Encounter (Signed)
YES

## 2017-10-07 NOTE — Telephone Encounter (Signed)
Spoke with pt and he has been scheduled with Dr. Derrel Nip on 10/13/2017 at 4pm for a physical. Pt is aware of appt date and time.

## 2017-10-13 ENCOUNTER — Encounter: Payer: Self-pay | Admitting: Endocrinology

## 2017-10-13 ENCOUNTER — Ambulatory Visit (INDEPENDENT_AMBULATORY_CARE_PROVIDER_SITE_OTHER): Payer: BLUE CROSS/BLUE SHIELD | Admitting: Internal Medicine

## 2017-10-13 ENCOUNTER — Ambulatory Visit (INDEPENDENT_AMBULATORY_CARE_PROVIDER_SITE_OTHER): Payer: BLUE CROSS/BLUE SHIELD | Admitting: Endocrinology

## 2017-10-13 ENCOUNTER — Encounter: Payer: Self-pay | Admitting: Internal Medicine

## 2017-10-13 VITALS — BP 136/98 | HR 79 | Ht 73.0 in | Wt 204.6 lb

## 2017-10-13 VITALS — BP 146/92 | HR 80 | Temp 98.4°F | Ht 73.0 in | Wt 204.2 lb

## 2017-10-13 DIAGNOSIS — Z23 Encounter for immunization: Secondary | ICD-10-CM

## 2017-10-13 DIAGNOSIS — I10 Essential (primary) hypertension: Secondary | ICD-10-CM | POA: Diagnosis not present

## 2017-10-13 DIAGNOSIS — Z0000 Encounter for general adult medical examination without abnormal findings: Secondary | ICD-10-CM

## 2017-10-13 DIAGNOSIS — R079 Chest pain, unspecified: Secondary | ICD-10-CM | POA: Diagnosis not present

## 2017-10-13 DIAGNOSIS — E781 Pure hyperglyceridemia: Secondary | ICD-10-CM | POA: Diagnosis not present

## 2017-10-13 DIAGNOSIS — E119 Type 2 diabetes mellitus without complications: Secondary | ICD-10-CM

## 2017-10-13 LAB — POCT GLYCOSYLATED HEMOGLOBIN (HGB A1C): Hemoglobin A1C: 8.2 % — AB (ref 4.0–5.6)

## 2017-10-13 MED ORDER — SAXAGLIPTIN HCL 5 MG PO TABS: 5.0000 mg | ORAL_TABLET | Freq: Every day | ORAL | 11 refills | Status: DC

## 2017-10-13 NOTE — Patient Instructions (Addendum)
Resume amlodipine daily for one week and check your blood pressure  at home once daily for one week. Send me the blood pressure  readings on MyChart.   You need a minimum of 60 ounces of water daily  To keep your bowels moving regularly , even with Citrucel  You can add colace (docusate ) ,  up to 200 mg daily ,  For constipaiton .  This is  a stool softener    Your heart is fine,  But you have gotten out of shape and your job is very strenuous,  So you are getting light headed from the exertion.  START EXERCISING ,  FOR YOUR HEART AND YOUR DIABETES CONTROL   You can cut carbohydrates  from your diet using some of the breakfast  ideas below:  Danton Clap now makes 2  frozen breakfast items that are low carb and microwaveable in 2 minutes or less:  The  Frittata    The  "Egg'which"  . Niel Hummer are similar to quiches without the crust)  Truett Perna also makes a microwaveable omelet called "Just Crack an Egg"  Which you can find in the egg section    There are 4 good  Carb high Protein premixed Shakes:   Premier Protein  Atkins Advantage Muscle Milk EAS AdvantEdge   All of these are available at [employer], [employer],  [place]  And taste great

## 2017-10-13 NOTE — Patient Instructions (Addendum)
Your blood pressure is high today.  Please see your primary care provider soon, to have it rechecked. good diet and exercise significantly improve the control of your diabetes.  please let me know if you wish to be referred to a dietician.  high blood sugar is very risky to your health.  you should see an eye doctor and dentist every year.  It is very important to get all recommended vaccinations.  Controlling your blood pressure and cholesterol drastically reduces the damage diabetes does to your body.  Those who smoke should quit.  Please discuss these with your doctor.  check your blood sugar once a day.  vary the time of day when you check, between before the 3 meals, and at bedtime.  also check if you have symptoms of your blood sugar being too high or too low.  please keep a record of the readings and bring it to your next appointment here (or you can bring the meter itself).  You can write it on any piece of paper.  please call us sooner if your blood sugar goes below 70, or if you have a lot of readings over 200. I have sent a prescription to your pharmacy, to add "Onglyza."  Please continue the same metformin.   Please come back for a follow-up appointment in 2 months.

## 2017-10-13 NOTE — Progress Notes (Signed)
Subjective:    Patient ID: Jacob Baker, male    DOB: 02-18-1970, 47 y.o.   MRN: [id number]  HPI pt is referred by Dr Derrel Nip, for diabetes.  Pt states DM was dx'ed in 2017; he has mild neuropathy of the lower extremities; he is unaware of any associated chronic complications; he has never been on insulin; pt says his diet and exercise are poor; he has never had pancreatitis, pancreatic surgery, severe hypoglycemia or DKA.    Past Medical History:  Diagnosis Date  . CHF (congestive heart failure) ([place])   . Diabetes mellitus without complication ([place])   . DVT (deep venous thrombosis) ([place]) 04/2017  . GERD (gastroesophageal reflux disease)   . History of migraine    none in over 2 yrs  . Hyperlipidemia   . Hypertension   . Syncope and collapse    x1 - approx 2-3 yrs ago - dehydration    Past Surgical History:  Procedure Laterality Date  . COLONOSCOPY    . COLONOSCOPY WITH PROPOFOL N/A 08/06/2017   Procedure: COLONOSCOPY WITH PROPOFOL;  Surgeon: Lucilla Lame, MD;  Location: [facility];  Service: Endoscopy;  Laterality: N/A;  Diabetic - oral meds  . ESOPHAGEAL DILATION  09/24/2016   Procedure: ESOPHAGEAL DILATION;  Surgeon: Lucilla Lame, MD;  Location: [facility];  Service: Gastroenterology;;  . ESOPHAGOGASTRODUODENOSCOPY (EGD) WITH PROPOFOL N/A 04/08/2015   Procedure: ESOPHAGOGASTRODUODENOSCOPY (EGD) WITH PROPOFOL with dialation;  Surgeon: Lucilla Lame, MD;  Location: [facility];  Service: Endoscopy;  Laterality: N/A;  diabetic - oral meds  . ESOPHAGOGASTRODUODENOSCOPY (EGD) WITH PROPOFOL N/A 09/24/2016   Procedure: ESOPHAGOGASTRODUODENOSCOPY (EGD) WITH PROPOFOL WITH DILATION;  Surgeon: Lucilla Lame, MD;  Location: [facility];  Service: Gastroenterology;  Laterality: N/A;  Diabetic - oral meds  . FINGER AMPUTATION  2009   partial removal left index finger   . POLYPECTOMY  08/06/2017   Procedure: POLYPECTOMY INTESTINAL;  Surgeon: Lucilla Lame, MD;   Location: [facility];  Service: Endoscopy;;    Social History   Socioeconomic History  . Marital status: Married    Spouse name: Not on file  . Number of children: Not on file  . Years of education: Not on file  . Highest education level: Not on file  Occupational History  . Not on file  Social Needs  . Financial resource strain: Not on file  . Food insecurity:    Worry: Not on file    Inability: Not on file  . Transportation needs:    Medical: Not on file    Non-medical: Not on file  Tobacco Use  . Smoking status: Current Every Day Smoker    Packs/day: 2.00    Years: 27.00    Pack years: 54.00    Types: Cigarettes  . Smokeless tobacco: Never Used  Substance and Sexual Activity  . Alcohol use: No  . Drug use: No  . Sexual activity: Not on file  Lifestyle  . Physical activity:    Days per week: Not on file    Minutes per session: Not on file  . Stress: Not on file  Relationships  . Social connections:    Talks on phone: Not on file    Gets together: Not on file    Attends religious service: Not on file    Active member of club or organization: Not on file    Attends meetings of clubs or organizations: Not on file    Relationship status: Not on file  .  Intimate partner violence:    Fear of current or ex partner: Not on file    Emotionally abused: Not on file    Physically abused: Not on file    Forced sexual activity: Not on file  Other Topics Concern  . Not on file  Social History Narrative  . Not on file    Current Outpatient Medications on File Prior to Visit  Medication Sig Dispense Refill  . aspirin EC 325 MG tablet Take 1 tablet (325 mg total) by mouth daily. 100 tablet 3  . cyanocobalamin 1000 MCG tablet Take 100 mcg by mouth daily.    Marland Kitchen glucose blood (ONETOUCH VERIO) test strip Test sugars twice daily 50 each 6  . metFORMIN (GLUCOPHAGE) 500 MG tablet TAKE 1 TABLET BY MOUTH TWICE DAILY WITH A MEAL 180 tablet 0  . ONETOUCH DELICA LANCETS 33A  MISC Test sugars twice daily 50 each 3  . pantoprazole (PROTONIX) 40 MG tablet TAKE 1 TABLET(40 MG) BY MOUTH DAILY 30 tablet 5   No current facility-administered medications on file prior to visit.     No Known Allergies  Family History  Problem Relation Age of Onset  . Heart disease Father        CAD  . Hypertension Father   . Diabetes Father   . Hypertension Sister   . Hypertension Brother   . Hypertension Brother   . Hypertension Mother     BP (!) 136/98 (BP Location: Left Arm, Patient Position: Sitting)   Pulse 79   Ht 6\' 1"  (1.854 m)   Wt 204 lb 9.6 oz (92.8 kg)   SpO2 96%   BMI 26.99 kg/m    Review of Systems denies weight loss, blurry vision, chest pain, sob, n/v, muscle cramps, excessive diaphoresis, memory loss, depression, rhinorrhea, and easy bruising.  He has excessive thirst and urination.  He has  slight headache and cold intolerance.    Objective:   Physical Exam VS: see vs page GEN: no distress HEAD: head: no deformity eyes: no periorbital swelling, no proptosis external nose and ears are normal mouth: no lesion seen NECK: supple, thyroid is not enlarged CHEST WALL: no deformity LUNGS: clear to auscultation CV: reg rate and rhythm, no murmur ABD: abdomen is soft, nontender.  no hepatosplenomegaly.  not distended.  no hernia MUSCULOSKELETAL: muscle bulk and strength are grossly normal.  no obvious joint swelling.  gait is normal and steady EXTEMITIES: no deformity.  no ulcer on the feet.  feet are of normal color and temp.  no edema.  There is bilateral onychomycosis of the toenails.   PULSES: dorsalis pedis intact bilat.  no carotid bruit NEURO:  cn 2-12 grossly intact.   readily moves all 4's.  sensation is intact to touch on the feet.   SKIN:  Normal texture and temperature.  No rash or suspicious lesion is visible.   NODES:  None palpable at the neck PSYCH: alert, well-oriented.  Does not appear anxious nor depressed.  Lab Results  Component  Value Date   HGBA1C 8.2 (A) 10/13/2017   Lab Results  Component Value Date   CREATININE 0.90 05/19/2017   BUN 6 05/19/2017   NA 141 05/19/2017   K 3.4 (L) 05/19/2017   CL 104 05/19/2017   CO2 25 02/23/2017       Assessment & Plan:  Type 2 DM, new to me: he needs increased rx.  Lean body habitus.  In this setting, he is at risk for developing type  1.   HTN: is noted today.   Patient Instructions  Your blood pressure is high today.  Please see your primary care provider soon, to have it rechecked. good diet and exercise significantly improve the control of your diabetes.  please let me know if you wish to be referred to a dietician.  high blood sugar is very risky to your health.  you should see an eye doctor and dentist every year.  It is very important to get all recommended vaccinations.  Controlling your blood pressure and cholesterol drastically reduces the damage diabetes does to your body.  Those who smoke should quit.  Please discuss these with your doctor.  check your blood sugar once a day.  vary the time of day when you check, between before the 3 meals, and at bedtime.  also check if you have symptoms of your blood sugar being too high or too low.  please keep a record of the readings and bring it to your next appointment here (or you can bring the meter itself).  You can write it on any piece of paper.  please call us sooner if your blood sugar goes below 70, or if you have a lot of readings over 200. I have sent a prescription to your pharmacy, to add "Onglyza."  Please continue the same metformin.   Please come back for a follow-up appointment in 2 months.

## 2017-10-13 NOTE — Progress Notes (Signed)
Patient ID: Jacob Baker, male    DOB: 1970/05/25  Age: 47 y.o. MRN: [id number]  The patient is here for annual prevetive wellness examination and management of other chronic and acute problems.    Colonoscopy done July 2019: at least 4  Polyps were removed,  All , hyperplastic.  Had an episode of rectal bleeding 3 weeks ago .  No hemorrhoids were  Noted  On the  colonoscopy  Report   Has a physicall strenouous job breaking up concrete.  At times feelsl presyncopeal  Without chest pain.  Does not exercise    The risk factors are reflected in the social history.  The roster of all physicians providing medical care to patient - is listed in the Snapshot section of the chart.  Activities of daily living:  The patient is 100% independent in all ADLs: dressing, toileting, feeding as well as independent mobility  Home safety : The patient has smoke detectors in the home. They wear seatbelts.  There are no firearms at home. There is no violence in the home.   There is no risks for hepatitis, STDs or HIV. There is no   history of blood transfusion. They have no travel history to infectious disease endemic areas of the world.  The patient has seen their dentist in the last six month. They have seen their eye doctor in the last year. They admit to slight hearing difficulty with regard to whispered voices and some television programs.  They have deferred audiologic testing in the last year.  They do not  have excessive sun exposure. Discussed the need for sun protection: hats, long sleeves and use of sunscreen if there is significant sun exposure.   Diet: the importance of a healthy diet is discussed. They do have a healthy diet.  The benefits of regular aerobic exercise were discussed. She walks 4 times per week ,  20 minutes.   Depression screen: there are no signs or vegative symptoms of depression- irritability, change in appetite, anhedonia, sadness/tearfullness.  Cognitive assessment: the  patient manages all their financial and personal affairs and is actively engaged. They could relate day,date,year and events; recalled 2/3 objects at 3 minutes; performed clock-face test normally.  The following portions of the patient's history were reviewed and updated as appropriate: allergies, current medications, past family history, past medical history,  past surgical history, past social history  and problem list.  Visual acuity was not assessed per patient preference since she has regular follow up with her ophthalmologist. Hearing and body mass index were assessed and reviewed.   During the course of the visit the patient was educated and counseled about appropriate screening and preventive services including : fall prevention , diabetes screening, nutrition counseling, colorectal cancer screening, and recommended immunizations.    CC: The primary encounter diagnosis was Encounter for preventive health examination. Diagnoses of Need for immunization against influenza, Chest pain, unspecified type, Essential hypertension, and Diabetes mellitus without complication ([place]) were also pertinent to this visit.  1) Recurrent episodes of near syncope with extreme physical exertion.  Denies chest pain .  Does not exercise.     2) Patient does not check blood sugars more than once a month,  Last one was 135 a month ago in a fasting state.  Dos not recall any above 200 or less than 80.  No complaints today.  Taking his medications as directed,  Not exercising on a regular basis or trying to lose weight due to work schedule. Marland Kitchen  Patient voices limited  awareness  of the foods he/she needs to avoid,  And follows a low GI diet about 50% of the time.  Has not had an annual diabetic eye exam.  Denies numbness and tingling in lower extremities.  Denies hypoglycemic symptoms.   History Ayden has a past medical history of CHF (congestive heart failure) ([place]), Diabetes mellitus without complication ([place]), DVT  (deep venous thrombosis) ([place]) (04/2017), GERD (gastroesophageal reflux disease), History of migraine, Hyperlipidemia, Hypertension, and Syncope and collapse.   He has a past surgical history that includes Finger amputation (2009); Colonoscopy; Esophagogastroduodenoscopy (egd) with propofol (N/A, 04/08/2015); Esophagogastroduodenoscopy (egd) with propofol (N/A, 09/24/2016); Esophageal dilation (09/24/2016); Colonoscopy with propofol (N/A, 08/06/2017); and Polypectomy (08/06/2017).   His family history includes Diabetes in his father; Heart disease in his father; Hypertension in his brother, brother, father, mother, and sister.He reports that he has been smoking cigarettes. He has a 54.00 pack-year smoking history. He has never used smokeless tobacco. He reports that he does not drink alcohol or use drugs.  Outpatient Medications Prior to Visit  Medication Sig Dispense Refill  . amLODipine (NORVASC) 10 MG tablet TAKE 1 TABLET BY MOUTH EVERY DAY 90 tablet 1  . aspirin EC 325 MG tablet Take 1 tablet (325 mg total) by mouth daily. 100 tablet 3  . cyanocobalamin 1000 MCG tablet Take 100 mcg by mouth daily.    Marland Kitchen glucose blood (ONETOUCH VERIO) test strip Test sugars twice daily 50 each 6  . metFORMIN (GLUCOPHAGE) 500 MG tablet TAKE 1 TABLET BY MOUTH TWICE DAILY WITH A MEAL 180 tablet 0  . ONETOUCH DELICA LANCETS 50N MISC Test sugars twice daily 50 each 3  . pantoprazole (PROTONIX) 40 MG tablet TAKE 1 TABLET(40 MG) BY MOUTH DAILY 30 tablet 5  . saxagliptin HCl (ONGLYZA) 5 MG TABS tablet Take 1 tablet (5 mg total) by mouth daily. 30 tablet 11  . ondansetron (ZOFRAN) 4 MG tablet Take 1 tablet (4 mg total) by mouth 2 (two) times daily as needed for nausea or vomiting. 20 tablet 0   No facility-administered medications prior to visit.     Review of Systems   Patient denies headache, fevers, malaise, unintentional weight loss, skin rash, eye pain, sinus congestion and sinus pain, sore throat, dysphagia,   hemoptysis , cough, dyspnea, wheezing, chest pain, palpitations, orthopnea, edema, abdominal pain, nausea, melena, diarrhea, constipation, flank pain, dysuria, hematuria, urinary  Frequency, nocturia, numbness, tingling, seizures,  Focal weakness, Loss of consciousness,  Tremor, insomnia, depression, anxiety, and suicidal ideation.      Objective:  BP (!) 146/92   Pulse 80   Temp 98.4 F (36.9 C) (Oral)   Ht 6\' 1"  (1.854 m)   Wt 204 lb 3.2 oz (92.6 kg)   SpO2 98%   BMI 26.94 kg/m   Physical Exam  General appearance: alert, cooperative and appears stated age Ears: normal TM's and external ear canals both ears Throat: lips, mucosa, and tongue normal; teeth and gums normal Neck: no adenopathy, no carotid bruit, supple, symmetrical, trachea midline and thyroid not enlarged, symmetric, no tenderness/mass/nodules Back: symmetric, no curvature. ROM normal. No CVA tenderness. Lungs: clear to auscultation bilaterally Heart: regular rate and rhythm, S1, S2 normal, no murmur, click, rub or gallop Abdomen: soft, non-tender; bowel sounds normal; no masses,  no organomegaly Pulses: 2+ and symmetric Skin: Skin color, texture, turgor normal. No rashes or lesions Lymph nodes: Cervical, supraclavicular, and axillary nodes normal.   Assessment & Plan:   Problem List  Items Addressed This Visit    Chest pain    His recent near syncopal event was unaccompanied by chest pain. He has had noninvasive workup in 2017 . He remains  physically deconditioned . Encouraged to start exercising       Diabetes mellitus without complication ([place])    Loss of control on metformin and onglyza. . Dr Loanne Drilling managing.   Lab Results  Component Value Date   HGBA1C 8.2 (A) 10/13/2017   Lab Results  Component Value Date   MICROALBUR 2.8 (H) 02/23/2017         Encounter for preventive health examination - Primary    Annual comprehensive preventive exam was done as well as an evaluation and management of acute  and chronic conditions .  During the course of the visit the patient was educated and counseled about appropriate screening and preventive services including :  diabetes screening, lipid analysis with projected  10 year  risk for CAD , nutrition counseling, prostate and colorectal cancer screening, and recommended immunizations.  Printed recommendations for health maintenance screenings was given.       Relevant Orders   Lipid panel (Completed)   Comprehensive metabolic panel (Completed)   Essential hypertension    he reports compliance with medication regimen  but has an elevated reading today in office because he has not taken his amlodipine today.  He has been asked to check his BP at work and  submit readings for evaluation. Renal function will be checked today  Lab Results  Component Value Date   CREATININE 1.00 10/13/2017   Lab Results  Component Value Date   NA 140 10/13/2017   K 3.4 (L) 10/13/2017   CL 105 10/13/2017   CO2 27 10/13/2017          Other Visit Diagnoses    Need for immunization against influenza       Relevant Orders   Flu Vaccine QUAD 36+ mos IM (Completed)      I have discontinued Shanon Brow A. Cassara's ondansetron. I am also having him maintain his [facility] LANCETS 54S, glucose blood, cyanocobalamin, aspirin EC, metFORMIN, amLODipine, pantoprazole, and saxagliptin HCl.  No orders of the defined types were placed in this encounter.   Medications Discontinued During This Encounter  Medication Reason  . ondansetron (ZOFRAN) 4 MG tablet Patient Preference    Follow-up: No follow-ups on file.   [name], MD

## 2017-10-14 ENCOUNTER — Encounter: Payer: Self-pay | Admitting: Internal Medicine

## 2017-10-14 LAB — COMPREHENSIVE METABOLIC PANEL
ALT: 37 U/L (ref 0–53)
AST: 17 U/L (ref 0–37)
Albumin: 4.5 g/dL (ref 3.5–5.2)
Alkaline Phosphatase: 99 U/L (ref 39–117)
BUN: 11 mg/dL (ref 6–23)
CO2: 27 mEq/L (ref 19–32)
CREATININE: 1 mg/dL (ref 0.40–1.50)
Calcium: 9.2 mg/dL (ref 8.4–10.5)
Chloride: 105 mEq/L (ref 96–112)
GFR: 101.94 mL/min (ref >60.00)
GLUCOSE: 170 mg/dL — AB (ref 70–99)
POTASSIUM: 3.4 meq/L — AB (ref 3.5–5.1)
SODIUM: 140 meq/L (ref 135–145)
TOTAL PROTEIN: 6.7 g/dL (ref 6.0–8.3)
Total Bilirubin: 0.8 mg/dL (ref 0.2–1.2)

## 2017-10-14 LAB — LIPID PANEL
CHOL/HDL RATIO: 9
CHOLESTEROL: 218 mg/dL — AB (ref 0–200)
HDL: 25.1 mg/dL — ABNORMAL LOW (ref >39.00)
Triglycerides: 461 mg/dL — ABNORMAL HIGH (ref 0.0–149.0)

## 2017-10-14 LAB — LDL CHOLESTEROL, DIRECT: Direct LDL: 128 mg/dL

## 2017-10-14 MED ORDER — FENOFIBRATE 54 MG PO TABS: 54.0000 mg | ORAL_TABLET | Freq: Every day | ORAL | 0 refills | Status: DC

## 2017-10-14 MED ORDER — LOSARTAN POTASSIUM 50 MG PO TABS: 50.0000 mg | ORAL_TABLET | Freq: Every day | ORAL | 0 refills | Status: DC

## 2017-10-14 NOTE — Assessment & Plan Note (Addendum)
Loss of control on metformin and onglyza. . Dr Loanne Drilling managing.   Needs to add ace inhibitor  Lab Results  Component Value Date   HGBA1C 8.2 (A) 10/13/2017   Lab Results  Component Value Date   MICROALBUR 2.8 (H) 02/23/2017

## 2017-10-14 NOTE — Assessment & Plan Note (Signed)

## 2017-10-14 NOTE — Assessment & Plan Note (Signed)
His recent near syncopal event was unaccompanied by chest pain. He has had noninvasive workup in 2017 . He remains  physically deconditioned . Encouraged to start exercising

## 2017-10-14 NOTE — Assessment & Plan Note (Addendum)
he reports compliance with medication regimen  but has an elevated reading today in office because he has not taken his amlodipine today.  He has been asked to change BP med's to losartan given his proteinuria   Lab Results  Component Value Date   CREATININE 1.00 10/13/2017   Lab Results  Component Value Date   NA 140 10/13/2017   K 3.4 (L) 10/13/2017   CL 105 10/13/2017   CO2 27 10/13/2017

## 2017-11-19 ENCOUNTER — Encounter: Payer: BLUE CROSS/BLUE SHIELD | Admitting: Internal Medicine

## 2017-12-14 ENCOUNTER — Ambulatory Visit: Payer: BLUE CROSS/BLUE SHIELD | Admitting: Endocrinology

## 2017-12-17 ENCOUNTER — Ambulatory Visit: Payer: BLUE CROSS/BLUE SHIELD | Admitting: Endocrinology

## 2017-12-17 DIAGNOSIS — Z0289 Encounter for other administrative examinations: Secondary | ICD-10-CM

## 2018-01-16 ENCOUNTER — Other Ambulatory Visit: Payer: Self-pay | Admitting: Internal Medicine

## 2018-01-28 ENCOUNTER — Ambulatory Visit: Payer: Self-pay | Admitting: Internal Medicine

## 2018-01-28 MED ORDER — VARENICLINE TARTRATE 0.5 MG X 11 & 1 MG X 42 PO MISC: ORAL | 0 refills | Status: DC

## 2018-01-28 NOTE — Telephone Encounter (Signed)
Advised patient to read my chart message thoroughly.

## 2018-01-28 NOTE — Addendum Note (Signed)
Addended by: [name] on: 01/28/2018 02:28 PM   Modules accepted: Orders

## 2018-01-28 NOTE — Telephone Encounter (Signed)
Patient would like to stop smoking and would like to be prescribed Chantix. [facility] on [address] [place] [address] / OV on 10/13/17 with Dr. Derrel Nip for physical / Please send Chantixi if appropriate. /

## 2018-02-10 DIAGNOSIS — L03032 Cellulitis of left toe: Secondary | ICD-10-CM | POA: Diagnosis not present

## 2018-02-10 DIAGNOSIS — L600 Ingrowing nail: Secondary | ICD-10-CM | POA: Diagnosis not present

## 2018-02-10 DIAGNOSIS — M79671 Pain in right foot: Secondary | ICD-10-CM | POA: Diagnosis not present

## 2018-02-10 DIAGNOSIS — M79672 Pain in left foot: Secondary | ICD-10-CM | POA: Diagnosis not present

## 2018-02-19 DIAGNOSIS — E119 Type 2 diabetes mellitus without complications: Secondary | ICD-10-CM | POA: Diagnosis not present

## 2018-02-19 DIAGNOSIS — H40033 Anatomical narrow angle, bilateral: Secondary | ICD-10-CM | POA: Diagnosis not present

## 2018-03-06 ENCOUNTER — Other Ambulatory Visit: Payer: Self-pay | Admitting: Internal Medicine

## 2018-03-07 ENCOUNTER — Other Ambulatory Visit: Payer: Self-pay | Admitting: Endocrinology

## 2018-03-11 ENCOUNTER — Encounter: Payer: Self-pay | Admitting: Endocrinology

## 2018-03-11 ENCOUNTER — Ambulatory Visit: Payer: BLUE CROSS/BLUE SHIELD | Admitting: Endocrinology

## 2018-03-11 VITALS — BP 128/82 | HR 86 | Ht 73.0 in | Wt 209.6 lb

## 2018-03-11 DIAGNOSIS — E119 Type 2 diabetes mellitus without complications: Secondary | ICD-10-CM | POA: Diagnosis not present

## 2018-03-11 LAB — POCT GLYCOSYLATED HEMOGLOBIN (HGB A1C): HEMOGLOBIN A1C: 7.5 % — AB (ref 4.0–5.6)

## 2018-03-11 MED ORDER — METFORMIN HCL ER 500 MG PO TB24: 1000.0000 mg | ORAL_TABLET | Freq: Every day | ORAL | 3 refills | Status: DC

## 2018-03-11 MED ORDER — DAPAGLIFLOZIN PROPANEDIOL 5 MG PO TABS: 5.0000 mg | ORAL_TABLET | Freq: Every day | ORAL | 3 refills | Status: DC

## 2018-03-11 NOTE — Patient Instructions (Addendum)
check your blood sugar once a day.  vary the time of day when you check, between before the 3 meals, and at bedtime.  also check if you have symptoms of your blood sugar being too high or too low.  please keep a record of the readings and bring it to your next appointment here (or you can bring the meter itself).  You can write it on any piece of paper.  please call us sooner if your blood sugar goes below 70, or if you have a lot of readings over 200. I have sent a prescription to your pharmacy, to add "[place]."   Please come back for a follow-up appointment in 2-3 months.

## 2018-03-11 NOTE — Progress Notes (Signed)
Subjective:    Patient ID: Jacob Baker, male    DOB: December 04, 1970, 48 y.o.   MRN: [id number]  HPI Pt returns for f/u of diabetes mellitus: DM type: 2 (but lean body habitus suggests risk for developing type 1) Dx'ed: 3299 Complications: PN Therapy: 2 oral meds DKA: never Severe hypoglycemia: never Pancreatitis: never Pancreatic imaging: normal on 2015 CT Other: he has never been on insulin Interval history: no cbg record, but states cbg's are in the 200's.  pt states he feels well in general.   Past Medical History:  Diagnosis Date  . CHF (congestive heart failure) ([place])   . Diabetes mellitus without complication ([place])   . DVT (deep venous thrombosis) ([place]) 04/2017  . GERD (gastroesophageal reflux disease)   . History of migraine    none in over 2 yrs  . Hyperlipidemia   . Hypertension   . Syncope and collapse    x1 - approx 2-3 yrs ago - dehydration    Past Surgical History:  Procedure Laterality Date  . COLONOSCOPY    . COLONOSCOPY WITH PROPOFOL N/A 08/06/2017   Procedure: COLONOSCOPY WITH PROPOFOL;  Surgeon: Lucilla Lame, MD;  Location: [place];  Service: Endoscopy;  Laterality: N/A;  Diabetic - oral meds  . ESOPHAGEAL DILATION  09/24/2016   Procedure: ESOPHAGEAL DILATION;  Surgeon: Lucilla Lame, MD;  Location: [place];  Service: Gastroenterology;;  . ESOPHAGOGASTRODUODENOSCOPY (EGD) WITH PROPOFOL N/A 04/08/2015   Procedure: ESOPHAGOGASTRODUODENOSCOPY (EGD) WITH PROPOFOL with dialation;  Surgeon: Lucilla Lame, MD;  Location: [place];  Service: Endoscopy;  Laterality: N/A;  diabetic - oral meds  . ESOPHAGOGASTRODUODENOSCOPY (EGD) WITH PROPOFOL N/A 09/24/2016   Procedure: ESOPHAGOGASTRODUODENOSCOPY (EGD) WITH PROPOFOL WITH DILATION;  Surgeon: Lucilla Lame, MD;  Location: [place];  Service: Gastroenterology;  Laterality: N/A;  Diabetic - oral meds  . FINGER AMPUTATION  2009   partial removal left index finger   . POLYPECTOMY   08/06/2017   Procedure: POLYPECTOMY INTESTINAL;  Surgeon: Lucilla Lame, MD;  [facility];  Service: Endoscopy;;    Social History   Socioeconomic History  . Marital status: Married    Spouse name: Not on file  . Number of children: Not on file  . Years of education: Not on file  . Highest education level: Not on file  Occupational History  . Not on file  Social Needs  . Financial resource strain: Not on file  . Food insecurity:    Worry: Not on file    Inability: Not on file  . Transportation needs:    Medical: Not on file    Non-medical: Not on file  Tobacco Use  . Smoking status: Current Every Day Smoker    Packs/day: 2.00    Years: 27.00    Pack years: 54.00    Types: Cigarettes  . Smokeless tobacco: Never Used  Substance and Sexual Activity  . Alcohol use: No  . Drug use: No  . Sexual activity: Not on file  Lifestyle  . Physical activity:    Days per week: Not on file    Minutes per session: Not on file  . Stress: Not on file  Relationships  . Social connections:    Talks on phone: Not on file    Gets together: Not on file    Attends religious service: Not on file    Active member of club or organization: Not on file    Attends meetings of clubs or organizations: Not  on file    Relationship status: Not on file  . Intimate partner violence:    Fear of current or ex partner: Not on file    Emotionally abused: Not on file    Physically abused: Not on file    Forced sexual activity: Not on file  Other Topics Concern  . Not on file  Social History Narrative  . Not on file    Current Outpatient Medications on File Prior to Visit  Medication Sig Dispense Refill  . amLODipine (NORVASC) 10 MG tablet TAKE 1 TABLET BY MOUTH EVERY DAY 90 tablet 1  . aspirin EC 325 MG tablet Take 1 tablet (325 mg total) by mouth daily. 100 tablet 3  . cyanocobalamin 1000 MCG tablet Take 100 mcg by mouth daily.    . fenofibrate 54 MG tablet TAKE 1 TABLET(54 MG) BY  MOUTH DAILY 90 tablet 0  . glucose blood (ONETOUCH VERIO) test strip Test sugars twice daily 50 each 6  . losartan (COZAAR) 50 MG tablet Take 1 tablet (50 mg total) by mouth daily. 90 tablet 0  . ONETOUCH DELICA LANCETS 16X MISC Test sugars twice daily 50 each 3  . pantoprazole (PROTONIX) 40 MG tablet TAKE 1 TABLET(40 MG) BY MOUTH DAILY 30 tablet 5  . saxagliptin HCl (ONGLYZA) 5 MG TABS tablet Take 1 tablet (5 mg total) by mouth daily. 30 tablet 11  . varenicline (CHANTIX STARTING MONTH PAK) 0.5 MG X 11 & 1 MG X 42 tablet Take one 0.5 mg tablet by mouth once daily for 3 days, then increase to one 0.5 mg tablet twice daily for 4 days, then increase to one 1 mg tablet twice daily. 53 tablet 0   No current facility-administered medications on file prior to visit.     No Known Allergies  Family History  Problem Relation Age of Onset  . Heart disease Father        CAD  . Hypertension Father   . Diabetes Father   . Hypertension Sister   . Hypertension Brother   . Hypertension Brother   . Hypertension Mother     BP 128/82 (BP Location: Right Arm, Patient Position: Sitting, Cuff Size: Normal)   Pulse 86   Ht 6\' 1"  (1.854 m)   Wt 209 lb 9.6 oz (95.1 kg)   SpO2 90%   BMI 27.65 kg/m   Review of Systems He denies hypoglycemia    Objective:   Physical Exam VITAL SIGNS:  See vs page GENERAL: no distress Pulses: dorsalis pedis intact bilat.   MSK: no deformity of the feet CV: no leg edema Skin:  no ulcer on the feet.  normal color and temp on the feet. Neuro: sensation is intact to touch on the feet Ext: There is bilateral onychomycosis of the toenails.  Left great toenail is s/p resection  A1c=7.5%     Assessment & Plan:  Type 2 DM, with PN: he needs increased rx  Patient Instructions  check your blood sugar once a day.  vary the time of day when you check, between before the 3 meals, and at bedtime.  also check if you have symptoms of your blood sugar being too high or too  low.  please keep a record of the readings and bring it to your next appointment here (or you can bring the meter itself).  You can write it on any piece of paper.  please call us sooner if your blood sugar goes below 70, or if you  have a lot of readings over 200. I have sent a prescription to your pharmacy, to add "[place]ade."   Please come back for a follow-up appointment in 2-3 months.

## 2018-03-19 ENCOUNTER — Other Ambulatory Visit: Payer: Self-pay | Admitting: Gastroenterology

## 2018-04-09 ENCOUNTER — Encounter (INDEPENDENT_AMBULATORY_CARE_PROVIDER_SITE_OTHER): Payer: BLUE CROSS/BLUE SHIELD

## 2018-04-09 DIAGNOSIS — J019 Acute sinusitis, unspecified: Secondary | ICD-10-CM

## 2018-04-13 ENCOUNTER — Other Ambulatory Visit: Payer: Self-pay | Admitting: Internal Medicine

## 2018-04-13 MED ORDER — ONDANSETRON HCL 4 MG PO TABS: 4.0000 mg | ORAL_TABLET | Freq: Three times a day (TID) | ORAL | 0 refills | Status: DC | PRN

## 2018-04-13 MED ORDER — AMOXICILLIN-POT CLAVULANATE 875-125 MG PO TABS
1.0000 | ORAL_TABLET | Freq: Two times a day (BID) | ORAL | 0 refills | Status: DC
Start: 2018-04-13 — End: 2018-05-04

## 2018-04-13 MED ORDER — PREDNISONE 10 MG PO TABS: ORAL_TABLET | ORAL | 0 refills | Status: DC

## 2018-04-13 NOTE — Progress Notes (Unsigned)
Patient treated via Mychart for acute sinusitis.

## 2018-04-13 NOTE — Telephone Encounter (Signed)
Patient was treated via Mychart for acute sinusitis.   Written agreement to the charge is documented in his response (see mychart)

## 2018-04-14 NOTE — Telephone Encounter (Signed)
Addendum to Mychart documentation : A total of 10 minutes of provider time was spent reviewing patient's chart , coming up with a treatment plan for his acute sinusitis andcommunicating wth patient .

## 2018-05-02 ENCOUNTER — Other Ambulatory Visit: Payer: Self-pay | Admitting: Internal Medicine

## 2018-05-02 ENCOUNTER — Telehealth: Payer: Self-pay

## 2018-05-02 MED ORDER — LACTULOSE 20 GM/30ML PO SOLN: ORAL | 3 refills | Status: DC

## 2018-05-02 NOTE — Telephone Encounter (Signed)
Pt has been scheduled for a telephone visit on Wednesday at 3pm.

## 2018-05-02 NOTE — Telephone Encounter (Signed)
Pt calling back again.  Would like to hear something asap.

## 2018-05-02 NOTE — Telephone Encounter (Signed)
Pt is scheduled for Wednesday 05/04/2018.

## 2018-05-02 NOTE — Telephone Encounter (Signed)
Copied from [place] [id number]. Topic: General - Other >> May 02, 2018  1:21 PM Gustavus Messing wrote: Reason for CRM: The patient was returning a call to [place]. Would like a call back.

## 2018-05-02 NOTE — Telephone Encounter (Signed)
Copied from [place] [id number]. Topic: General - Inquiry >> May 02, 2018 10:37 AM Scherrie Gerlach wrote: Reason for CRM: pt sent mychart message, says Jacob Moores called him back and they got disconnected.  No answer at the office

## 2018-05-02 NOTE — Progress Notes (Signed)
lactulo

## 2018-05-04 ENCOUNTER — Ambulatory Visit (INDEPENDENT_AMBULATORY_CARE_PROVIDER_SITE_OTHER): Payer: BLUE CROSS/BLUE SHIELD | Admitting: Internal Medicine

## 2018-05-04 DIAGNOSIS — K2960 Other gastritis without bleeding: Secondary | ICD-10-CM | POA: Diagnosis not present

## 2018-05-04 DIAGNOSIS — K5900 Constipation, unspecified: Secondary | ICD-10-CM | POA: Diagnosis not present

## 2018-05-04 DIAGNOSIS — R1010 Upper abdominal pain, unspecified: Secondary | ICD-10-CM

## 2018-05-04 DIAGNOSIS — D126 Benign neoplasm of colon, unspecified: Secondary | ICD-10-CM

## 2018-05-04 DIAGNOSIS — T39395A Adverse effect of other nonsteroidal anti-inflammatory drugs [NSAID], initial encounter: Secondary | ICD-10-CM

## 2018-05-04 MED ORDER — PEG 3350-KCL-NABCB-NACL-NASULF 236 G PO SOLR: ORAL | 0 refills | Status: DC

## 2018-05-04 NOTE — Progress Notes (Signed)
Virtual Visit via Teleohone Note  This visit type was conducted due to national recommendations for restrictions regarding the COVID-19 pandemic (e.g. social distancing).  This format is felt to be most appropriate for this patient at this time.  All issues noted in this document were discussed and addressed.  No physical exam was performed (except for noted visual exam findings with Video Visits).   I connected with@ on 05/04/18 at  3:00 PM EDT by telephone and verified that I am speaking with the correct person using two identifiers. Location patient: home Location provider: work or home office Persons participating in the virtual visit: patient, provider  I discussed the limitations, risks, security and privacy concerns of performing an evaluation and management service by telephone and the availability of in person appointments. I also discussed with the patient that there may be a patient responsible charge related to this service. The patient expressed understanding and agreed to proceed.  Interactive audio and video telecommunications were attempted between this provider and patient, however failed, due to patient  Not having  access to video capability.  We continued and completed visit with audio only.   Reason for visit: abdominal pain   HPI:  Reason for visit: abdominal pain and constipation . Healthy 48 yr old male with history of abdominal pain and constipation attributed to IBS following diagnostic work up  /IBS (2 prior colonoscopies (2019 and 2015)  and 3 prior EGDs, last  colonoscopy July 2019:  5 non TA's removed.  Last EGD August 2018:  Normal  Benign stricture noted on 2017 EGD  ) presents with 2 week history of abdominal pain described as a constant "knot" in his stomach,  No nausea, gets hungry but afraid to eat because his  pain is made worse by eating.  Has become very constipated over the last 2 weeks  Did not move bowels for 7-8 days recently  , denies blood in stools . His  abdomen feels distended. He denies fevers and LLQ pain. No history of diverticulosis or itis.  Had similar symptoms 5 years ago and underwent EGD and colonoscopy  .  At that time he was prescribed dicyclomine which he recalls helped.    ROS: See pertinent positives and negatives per HPI.  Past Medical History:  Diagnosis Date  . CHF (congestive heart failure) ([place])   . Diabetes mellitus without complication ([place])   . DVT (deep venous thrombosis) ([place]) 04/2017  . GERD (gastroesophageal reflux disease)   . History of migraine    none in over 2 yrs  . Hyperlipidemia   . Hypertension   . Syncope and collapse    x1 - approx 2-3 yrs ago - dehydration    Past Surgical History:  Procedure Laterality Date  . COLONOSCOPY    . COLONOSCOPY WITH PROPOFOL N/A 08/06/2017   Procedure: COLONOSCOPY WITH PROPOFOL;  Surgeon: Lucilla Lame, MD;  Location: [place];  Service: Endoscopy;  Laterality: N/A;  Diabetic - oral meds  . ESOPHAGEAL DILATION  09/24/2016   Procedure: ESOPHAGEAL DILATION;  Surgeon: Lucilla Lame, MD;  Location: [place];  Service: Gastroenterology;;  . ESOPHAGOGASTRODUODENOSCOPY (EGD) WITH PROPOFOL N/A 04/08/2015   Procedure: ESOPHAGOGASTRODUODENOSCOPY (EGD) WITH PROPOFOL with dialation;  Surgeon: Lucilla Lame, MD;  Location: [place];  Service: Endoscopy;  Laterality: N/A;  diabetic - oral meds  . ESOPHAGOGASTRODUODENOSCOPY (EGD) WITH PROPOFOL N/A 09/24/2016   Procedure: ESOPHAGOGASTRODUODENOSCOPY (EGD) WITH PROPOFOL WITH DILATION;  Surgeon: Lucilla Lame, MD;  Location: [place];  Service: Gastroenterology;  Laterality: N/A;  Diabetic - oral meds  . FINGER AMPUTATION  2009   partial removal left index finger   . POLYPECTOMY  08/06/2017   Procedure: POLYPECTOMY INTESTINAL;  Surgeon: Lucilla Lame, MD;  Location: [place];  Service: Endoscopy;;    Family History  Problem Relation Age of Onset  . Heart disease Father        CAD  .  Hypertension Father   . Diabetes Father   . Hypertension Sister   . Hypertension Brother   . Hypertension Brother   . Hypertension Mother     SOCIAL HX: denies alcohol use.  Using tobacco   Not currently using NSAIDs    Current Outpatient Medications:  .  amLODipine (NORVASC) 10 MG tablet, TAKE 1 TABLET BY MOUTH EVERY DAY, Disp: 90 tablet, Rfl: 1 .  aspirin EC 325 MG tablet, Take 1 tablet (325 mg total) by mouth daily., Disp: 100 tablet, Rfl: 3 .  cyanocobalamin 1000 MCG tablet, Take 100 mcg by mouth daily., Disp: , Rfl:  .  fenofibrate 54 MG tablet, TAKE 1 TABLET(54 MG) BY MOUTH DAILY, Disp: 90 tablet, Rfl: 0 .  glucose blood (ONETOUCH VERIO) test strip, Test sugars twice daily, Disp: 50 each, Rfl: 6 .  Lactulose 20 GM/30ML SOLN, 30 ml every 4 hours until constipation is relieved, Disp: 236 mL, Rfl: 3 .  metFORMIN (GLUCOPHAGE-XR) 500 MG 24 hr tablet, Take 2 tablets (1,000 mg total) by mouth daily., Disp: 180 tablet, Rfl: 3 .  ONETOUCH DELICA LANCETS 70W MISC, Test sugars twice daily, Disp: 50 each, Rfl: 3 .  pantoprazole (PROTONIX) 40 MG tablet, TAKE 1 TABLET(40 MG) BY MOUTH DAILY, Disp: 30 tablet, Rfl: 5 .  saxagliptin HCl (ONGLYZA) 5 MG TABS tablet, Take 1 tablet (5 mg total) by mouth daily., Disp: 30 tablet, Rfl: 11 .  polyethylene glycol (GOLYTELY) 236 g solution, Drink 8 ounces every 30 minutes until  Stool runs clear, Disp: 4000 mL, Rfl: 0  EXAM:  VITALS per patient if applicable:  GENERAL impressiom : alert, oriented, appears well and in no acute distress   PSYCH/NEURO: pleasant and cooperative, no obvious depression but sounded mildly anxious y, speech and thought processing grossly intact  ASSESSMENT AND PLAN:  Discussed the following assessment and plan:  Tubular adenoma of colon Repeat colonoscopy July 2019 retrieved several hyperplastic polyps,  No TA's  Constipation Episodic.  Advised in July 2019 to use citrucel.  Recent colonoscopy July 2019 normal except for  hyperplastic polyps .  Dicussed using bowel prep as a cathartic, then resume use of citrucel and mg daily   Abdominal pain Already taking a PPI  Adding dicyclomine   If no improvement after relieving constipation,  Will refer to GI vs trial of elavil    Updated Medication List Outpatient Encounter Medications as of 05/04/2018  Medication Sig  . amLODipine (NORVASC) 10 MG tablet TAKE 1 TABLET BY MOUTH EVERY DAY  . aspirin EC 325 MG tablet Take 1 tablet (325 mg total) by mouth daily.  . cyanocobalamin 1000 MCG tablet Take 100 mcg by mouth daily.  . fenofibrate 54 MG tablet TAKE 1 TABLET(54 MG) BY MOUTH DAILY  . glucose blood (ONETOUCH VERIO) test strip Test sugars twice daily  . Lactulose 20 GM/30ML SOLN 30 ml every 4 hours until constipation is relieved  . metFORMIN (GLUCOPHAGE-XR) 500 MG 24 hr tablet Take 2 tablets (1,000 mg total) by mouth daily.  [place]ory [place] DELICA LANCETS 23J MISC Test  sugars twice daily  . pantoprazole (PROTONIX) 40 MG tablet TAKE 1 TABLET(40 MG) BY MOUTH DAILY  . saxagliptin HCl (ONGLYZA) 5 MG TABS tablet Take 1 tablet (5 mg total) by mouth daily.  . polyethylene glycol (GOLYTELY) 236 g solution Drink 8 ounces every 30 minutes until  Stool runs clear  . [DISCONTINUED] amoxicillin-clavulanate (AUGMENTIN) 875-125 MG tablet Take 1 tablet by mouth 2 (two) times daily. (Patient not taking: Reported on 05/04/2018)  . [DISCONTINUED] dapagliflozin propanediol (FARXIGA) 5 MG TABS tablet Take 5 mg by mouth daily. (Patient not taking: Reported on 05/04/2018)  . [DISCONTINUED] losartan (COZAAR) 50 MG tablet Take 1 tablet (50 mg total) by mouth daily. (Patient not taking: Reported on 05/04/2018)  . [DISCONTINUED] ondansetron (ZOFRAN) 4 MG tablet Take 1 tablet (4 mg total) by mouth every 8 (eight) hours as needed for nausea or vomiting. (Patient not taking: Reported on 05/04/2018)  . [DISCONTINUED] predniSONE (DELTASONE) 10 MG tablet 6 tablets on Day 1 , then reduce by 1 tablet daily until gone  (Patient not taking: Reported on 05/04/2018)  . [DISCONTINUED] varenicline (CHANTIX STARTING MONTH PAK) 0.5 MG X 11 & 1 MG X 42 tablet Take one 0.5 mg tablet by mouth once daily for 3 days, then increase to one 0.5 mg tablet twice daily for 4 days, then increase to one 1 mg tablet twice daily. (Patient not taking: Reported on 05/04/2018)   No facility-administered encounter medications on file as of 05/04/2018.      I discussed the assessment and treatment plan with the patient. The patient was provided an opportunity to ask questions and all were answered. The patient agreed with the plan and demonstrated an understanding of the instructions.   The patient was advised to call back or seek an in-person evaluation if the symptoms worsen or if the condition fails to improve as anticipated.  I provided 25 minutes of non-face-to-face time during this encounter.   [name], MD

## 2018-05-04 NOTE — Assessment & Plan Note (Addendum)
Episodic.  Advised in July 2019 to use citrucel.  Recent colonoscopy July 2019 normal except for hyperplastic polyps .  Dicussed using bowel prep as a cathartic, then resume use of citrucel and mg daily

## 2018-05-04 NOTE — Patient Instructions (Signed)
I think your constipation is causing your "stomach " pain as well.  I recommend drinking the [place] bowel prep when you can be home for at least a full night  Drink an 8 ounce serving every 30 minutes,  And keep drinking it until your stool starts to run clear (it will be liquid)  Once you are "cleaned out,"  I want you take a magnesium capsule (mag ox 400 mg ) every night,  Along with EITHER 100 mg of colace  OR citrucel ,  TO KEEP YOUR BOWELS MOVING REGULARLY  YOU also need to make sure you are drinking  A minimum of 60 ounces of water daily   If your stomach pain does not improve after resolving the constipation,  Let me know and we will begin working up your abdominal pain.

## 2018-05-04 NOTE — Assessment & Plan Note (Signed)
Repeat colonoscopy July 2019 retrieved several hyperplastic polyps,  No TA's

## 2018-05-06 NOTE — Assessment & Plan Note (Signed)
Already taking a PPI  Adding dicyclomine   If no improvement after relieving constipation,  Will refer to GI vs trial of elavil

## 2018-05-16 ENCOUNTER — Other Ambulatory Visit: Payer: Self-pay | Admitting: Internal Medicine

## 2018-06-10 ENCOUNTER — Ambulatory Visit: Payer: BLUE CROSS/BLUE SHIELD | Admitting: Endocrinology

## 2018-06-22 ENCOUNTER — Telehealth: Payer: Self-pay

## 2018-06-22 NOTE — Telephone Encounter (Signed)
Canceled 06/10/18 appt. Called pt to reschedule appt. LVM requesting returned call.

## 2018-07-17 ENCOUNTER — Other Ambulatory Visit: Payer: Self-pay | Admitting: Internal Medicine

## 2018-07-17 DIAGNOSIS — M25511 Pain in right shoulder: Secondary | ICD-10-CM

## 2018-07-25 ENCOUNTER — Encounter: Payer: Self-pay | Admitting: Orthopedic Surgery

## 2018-07-25 ENCOUNTER — Ambulatory Visit: Payer: BC Managed Care – PPO | Admitting: Orthopedic Surgery

## 2018-07-25 ENCOUNTER — Other Ambulatory Visit: Payer: Self-pay

## 2018-07-25 ENCOUNTER — Ambulatory Visit (INDEPENDENT_AMBULATORY_CARE_PROVIDER_SITE_OTHER): Payer: BC Managed Care – PPO

## 2018-07-25 VITALS — BP 142/95 | HR 87 | Temp 97.5°F | Ht 73.0 in | Wt 209.0 lb

## 2018-07-25 DIAGNOSIS — M25511 Pain in right shoulder: Secondary | ICD-10-CM

## 2018-07-25 DIAGNOSIS — G8929 Other chronic pain: Secondary | ICD-10-CM | POA: Diagnosis not present

## 2018-07-25 DIAGNOSIS — M7551 Bursitis of right shoulder: Secondary | ICD-10-CM

## 2018-07-25 MED ORDER — MELOXICAM 7.5 MG PO TABS: 7.5000 mg | ORAL_TABLET | Freq: Every day | ORAL | 0 refills | Status: AC

## 2018-07-25 NOTE — Patient Instructions (Addendum)
Shoulder Impingement Syndrome/bursitis   Shoulder impingement syndrome is a condition that causes pain when connective tissues (tendons) surrounding the shoulder joint become pinched. These tendons are part of the group of muscles and tissues that help to stabilize the shoulder (rotator cuff). Beneath the rotator cuff is a fluid-filled sac (bursa) that allows the muscles and tendons to glide smoothly. The bursa may become swollen or irritated (bursitis). Bursitis, swelling in the rotator cuff tendons, or both conditions can decrease how much space is under a bone in the shoulder joint (acromion), resulting in impingement. What are the causes? Shoulder impingement syndrome may be caused by bursitis or swelling of the rotator cuff tendons, which may result from:  Repetitive overhead arm movements.  Falling onto the shoulder.  Weakness in the shoulder muscles. What increases the risk? You may be more likely to develop this condition if you:  Play sports that involve throwing, such as baseball.  Participate in sports such as tennis, volleyball, and swimming.  Work as a Curator, Games developer, or Architect. Some people are also more likely to develop impingement syndrome because of the shape of their acromion bone. What are the signs or symptoms? The main symptom of this condition is pain on the front or side of the shoulder. The pain may:  Get worse when lifting or raising the arm.  Get worse at night.  Wake you up from sleeping.  Feel sharp when the shoulder is moved and then fade to an ache. Other symptoms may include:  Tenderness.  Stiffness.  Inability to raise the arm above shoulder level or behind the body.  Weakness. How is this diagnosed? This condition may be diagnosed based on:  Your symptoms and medical history.  A physical exam.  Imaging tests, such as: ? X-rays. ? MRI. ? Ultrasound. How is this treated? This condition may be treated by:  Resting your  shoulder and avoiding all activities that cause pain or put stress on the shoulder.  Icing your shoulder.  NSAIDs to help reduce pain and swelling.  One or more injections of medicines to numb the area and reduce inflammation.  Physical therapy.  Surgery. This may be needed if nonsurgical treatments have not helped. Surgery may involve repairing the rotator cuff, reshaping the acromion, or removing the bursa. Follow these instructions at home: Managing pain, stiffness, and swelling   If directed, put ice on the injured area. ? Put ice in a plastic bag. ? Place a towel between your skin and the bag. ? Leave the ice on for 20 minutes, 2-3 times a day. Activity  Rest and return to your normal activities as told by your health care provider. Ask your health care provider what activities are safe for you.  Do exercises as told by your health care provider. General instructions  Do not use any products that contain nicotine or tobacco, such as cigarettes, e-cigarettes, and chewing tobacco. These can delay healing. If you need help quitting, ask your health care provider.  Ask your health care provider when it is safe for you to drive.  Take over-the-counter and prescription medicines only as told by your health care provider.  Keep all follow-up visits as told by your health care provider. This is important. How is this prevented?  Give your body time to rest between periods of activity.  Be safe and responsible while being active. This will help you avoid falls.  Maintain physical fitness, including strength and flexibility. Contact a health care provider if:  Your symptoms have not improved after 1-2 months of treatment and rest.  You cannot lift your arm away from your body. Summary  Shoulder impingement syndrome is a condition that causes pain when connective tissues (tendons) surrounding the shoulder joint become pinched.  The main symptom of this condition is pain on  the front or side of the shoulder.  This condition is usually treated with rest, ice, and pain medicines as needed. This information is not intended to replace advice given to you by your health care provider. Make sure you discuss any questions you have with your health care provider. Document Released: 01/12/2005 Document Revised: 05/06/2018 Document Reviewed: 07/07/2017 Elsevier Patient Education  2020 [place] ordered this encounter  Medications  . meloxicam (MOBIC) 7.5 MG tablet    Sig: Take 1 tablet (7.5 mg total) by mouth daily.    Dispense:  42 tablet    Refill:  0

## 2018-07-25 NOTE — Progress Notes (Signed)
Patient ID: Jacob Baker, male   DOB: Nov 22, 1970, 48 y.o.   MRN: [id number]  Chief Complaint  Patient presents with  . Shoulder Pain    right/ x 68months no injury     HPI Jacob Baker is a 48 y.o. male.  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  He complains of dull aching pain at night over the anterior joint line superior border the scapula.  His only treatment so far has been icy hot without improvement he notices decreased range of motion no weakness  Review of Systems Review of Systems  All other systems reviewed and are negative.   Past Medical History:  Diagnosis Date  . CHF (congestive heart failure) ([place])   . Diabetes mellitus without complication ([place])   . DVT (deep venous thrombosis) ([place]) 04/2017  . GERD (gastroesophageal reflux disease)   . History of migraine    none in over 2 yrs  . Hyperlipidemia   . Hypertension   . Syncope and collapse    x1 - approx 2-3 yrs ago - dehydration    Past Surgical History:  Procedure Laterality Date  . COLONOSCOPY    . COLONOSCOPY WITH PROPOFOL N/A 08/06/2017   Procedure: COLONOSCOPY WITH PROPOFOL;  Surgeon: Lucilla Lame, MD;  Location: [place];  Service: Endoscopy;  Laterality: N/A;  Diabetic - oral meds  . ESOPHAGEAL DILATION  09/24/2016   Procedure: ESOPHAGEAL DILATION;  Surgeon: Lucilla Lame, MD;  Location: [place];  Service: Gastroenterology;;  . ESOPHAGOGASTRODUODENOSCOPY (EGD) WITH PROPOFOL N/A 04/08/2015   Procedure: ESOPHAGOGASTRODUODENOSCOPY (EGD) WITH PROPOFOL with dialation;  Surgeon: Lucilla Lame, MD;  Location: [place];  Service: Endoscopy;  Laterality: N/A;  diabetic - oral meds  . ESOPHAGOGASTRODUODENOSCOPY (EGD) WITH PROPOFOL N/A 09/24/2016   Procedure: ESOPHAGOGASTRODUODENOSCOPY (EGD) WITH PROPOFOL WITH DILATION;  Surgeon: Lucilla Lame, MD;  Location: [place];  Service:  Gastroenterology;  Laterality: N/A;  Diabetic - oral meds  . FINGER AMPUTATION  2009   partial removal left index finger   . POLYPECTOMY  08/06/2017   Procedure: POLYPECTOMY INTESTINAL;  Surgeon: Lucilla Lame, MD;  Location: [place];  Service: Endoscopy;;    Family History  Problem Relation Age of Onset  . Heart disease Father        CAD  . Hypertension Father   . Diabetes Father   . Hypertension Sister   . Hypertension Brother   . Hypertension Brother   . Hypertension Mother      Social History   Tobacco Use  . Smoking status: Current Every Day Smoker    Packs/day: 2.00    Years: 27.00    Pack years: 54.00    Types: Cigarettes  . Smokeless tobacco: Never Used  Substance Use Topics  . Alcohol use: No  . Drug use: No    No Known Allergies  No Known Allergies   Current Meds  Medication Sig  . amLODipine (NORVASC) 10 MG tablet TAKE 1 TABLET BY MOUTH EVERY DAY  . cyanocobalamin 1000 MCG tablet Take 100 mcg by mouth daily.  . fenofibrate 54 MG tablet TAKE 1 TABLET(54 MG) BY MOUTH DAILY  . glucose blood (ONETOUCH VERIO) test strip Test sugars twice daily  . Lactulose 20 GM/30ML SOLN 30 ml every 4 hours until constipation is relieved  . metFORMIN (GLUCOPHAGE-XR) 500 MG 24 hr tablet Take 2 tablets (1,000 mg total) by  mouth daily.  Glory Rosebush DELICA LANCETS 09O MISC Test sugars twice daily  . pantoprazole (PROTONIX) 40 MG tablet TAKE 1 TABLET(40 MG) BY MOUTH DAILY  . polyethylene glycol (GOLYTELY) 236 g solution Drink 8 ounces every 30 minutes until  Stool runs clear  . saxagliptin HCl (ONGLYZA) 5 MG TABS tablet Take 1 tablet (5 mg total) by mouth daily.       Physical Exam BP (!) 142/95   Pulse 87   Ht 6\' 1"  (1.854 m)   Wt 209 lb (94.8 kg)   BMI 27.57 kg/m  Physical Exam Vitals signs and nursing note reviewed.  Constitutional:      Appearance: Normal appearance.  Neurological:     Mental Status: He is alert and oriented to person, place, and  time.  Psychiatric:        Mood and Affect: Mood normal.    Ambulatory status normal with no assistive devices Right Shoulder Exam   Tests  Apprehension: negative Cross arm: negative Impingement: positive Drop arm: negative Sulcus: absent  Other  Erythema: absent Sensation: normal Pulse: present  Comments:  Tenderness is noted over the superior border of the scapula as well as the anterior joint line.  Passive abduction is normal but painful passive flexion is painful between 120 degrees and 180 degrees with active motion to 120 degrees limited by pain stable in abduction external rotation.  No weakness in the empty can position abduction internal/external rotation he can only reach behind his back to his sacrum   Left Shoulder Exam  Left shoulder exam is normal.  Tenderness  The patient is experiencing no tenderness.   Range of Motion  The patient has normal left shoulder ROM.  Muscle Strength  The patient has normal left shoulder strength.  Tests  Apprehension: negative  Other  Erythema: absent Sensation: normal Pulse: present        MEDICAL DECISION SECTION  xrays ordered?  Yes  My independent reading of xrays:  [facility] Report  Dictated by Dr. Aline Brochure   Chief complaint  Pain right shoulder  Ap lat right shoulder Axillary views  Normal bone quality, no fracture   Impression normal x-rays    Encounter Diagnosis  Name Primary?  . Chronic right shoulder pain Yes   PLAN:   No orders of the defined types were placed in this encounter.  Injection? yes MRI/CT/? No  nsaids and injection   Procedure note the subacromial injection shoulder RIGHT    Verbal consent was obtained to inject the  RIGHT   Shoulder  Timeout was completed to confirm the injection site is a subacromial space of the  RIGHT  shoulder   Medication used Depo-Medrol 40 mg and lidocaine 1% 3 cc  Anesthesia was provided by ethyl  chloride  The injection was performed in the RIGHT  posterior subacromial space. After pinning the skin with alcohol and anesthetized the skin with ethyl chloride the subacromial space was injected using a 20-gauge needle. There were no complications  Sterile dressing was applied.  Meds ordered this encounter  Medications  . meloxicam (MOBIC) 7.5 MG tablet    Sig: Take 1 tablet (7.5 mg total) by mouth daily.    Dispense:  42 tablet    Refill:  0     Fu in 6 weeks

## 2018-08-23 ENCOUNTER — Telehealth: Payer: Self-pay | Admitting: Internal Medicine

## 2018-08-23 MED ORDER — AMLODIPINE BESYLATE 10 MG PO TABS: 10.0000 mg | ORAL_TABLET | Freq: Every day | ORAL | 1 refills | Status: DC

## 2018-08-23 NOTE — Telephone Encounter (Signed)
Medication has been refilled and pt was notified through [facility].

## 2018-08-23 NOTE — Telephone Encounter (Signed)
Medication:  amLODipine (NORVASC) 10 MG tablet   Patient is requesting a refill of this medication.    Pharmacy:  [facility] [place], [address] [phone] (Phone) [phone] (Fax)

## 2018-08-23 NOTE — Telephone Encounter (Signed)
Pt has not had an OV for dx since 10/13/17.  Called pt to schedule OV.  No answer.  LMTCB.

## 2018-09-02 ENCOUNTER — Ambulatory Visit: Payer: BC Managed Care – PPO | Admitting: Internal Medicine

## 2018-09-02 DIAGNOSIS — Z0289 Encounter for other administrative examinations: Secondary | ICD-10-CM

## 2018-09-05 ENCOUNTER — Encounter: Payer: Self-pay | Admitting: Orthopedic Surgery

## 2018-09-05 ENCOUNTER — Other Ambulatory Visit: Payer: Self-pay

## 2018-09-05 ENCOUNTER — Ambulatory Visit (INDEPENDENT_AMBULATORY_CARE_PROVIDER_SITE_OTHER): Payer: BC Managed Care – PPO | Admitting: Orthopedic Surgery

## 2018-09-05 VITALS — BP 135/96 | HR 95 | Ht 73.0 in | Wt 209.0 lb

## 2018-09-05 DIAGNOSIS — M7551 Bursitis of right shoulder: Secondary | ICD-10-CM | POA: Diagnosis not present

## 2018-09-05 DIAGNOSIS — D1722 Benign lipomatous neoplasm of skin and subcutaneous tissue of left arm: Secondary | ICD-10-CM

## 2018-09-05 MED ORDER — TRAMADOL-ACETAMINOPHEN 37.5-325 MG PO TABS: 1.0000 | ORAL_TABLET | Freq: Every day | ORAL | 5 refills | Status: DC

## 2018-09-05 NOTE — Progress Notes (Signed)
Progress Note   Patient ID: Jacob Baker, male   DOB: 1971-01-24, 48 y.o.   MRN: [id number]   Chief Complaint  Patient presents with  . Shoulder Pain    right / better / some pain at night sometimes   . Shoulder Problem    has non painful knot on left shoulder wants you to look at it     Encounter Diagnoses  Name Primary?  . Bursitis of right shoulder Yes  . Lipoma of left upper extremity     48 year old male status post injection subacromial space right shoulder with improvement although he still has pain at night and it wakes him up  He also has a mass on his left shoulder which she would like looked at    Review of Systems  Musculoskeletal:       Mass left shoulder nonpainful present 1 year      BP (!) 135/96   Pulse 95   Ht 6\' 1"  (1.854 m)   Wt 209 lb (94.8 kg)   BMI 27.57 kg/m   Physical Exam Vitals signs and nursing note reviewed.  Constitutional:      Appearance: Normal appearance.  Musculoskeletal:     Comments: Right shoulder no tenderness full range of motion cuff strength is normal  Left shoulder small mass approximately 2-1/2 x 3 cm over the left AC joint nontender appears to be subcutaneous no lymphadenopathy normal range of motion left shoulder  Neurological:     Mental Status: He is alert and oriented to person, place, and time.  Psychiatric:        Mood and Affect: Mood normal.      Medical decisions:  (Established problem worse, x-ray ,physical therapy, over-the-counter medicines, read outside film or summarize x-ray)  Data  Imaging:   None  Encounter Diagnoses  Name Primary?  . Bursitis of right shoulder Yes  . Lipoma of left upper extremity     PLAN:   Observation lipoma left shoulder.  Instructions given about lipoma regarding size change or painful presentation  Bursitis right shoulder improved and medication for night pain follow-up as needed   Arther Abbott, MD 09/05/2018 5:06 PM

## 2018-09-14 ENCOUNTER — Other Ambulatory Visit: Payer: Self-pay

## 2018-09-14 ENCOUNTER — Encounter: Payer: Self-pay | Admitting: Internal Medicine

## 2018-09-14 ENCOUNTER — Ambulatory Visit (INDEPENDENT_AMBULATORY_CARE_PROVIDER_SITE_OTHER): Payer: BC Managed Care – PPO | Admitting: Internal Medicine

## 2018-09-14 DIAGNOSIS — I10 Essential (primary) hypertension: Secondary | ICD-10-CM | POA: Diagnosis not present

## 2018-09-14 DIAGNOSIS — K5904 Chronic idiopathic constipation: Secondary | ICD-10-CM | POA: Diagnosis not present

## 2018-09-14 DIAGNOSIS — E1121 Type 2 diabetes mellitus with diabetic nephropathy: Secondary | ICD-10-CM | POA: Diagnosis not present

## 2018-09-14 DIAGNOSIS — E230 Hypopituitarism: Secondary | ICD-10-CM

## 2018-09-14 DIAGNOSIS — R5383 Other fatigue: Secondary | ICD-10-CM

## 2018-09-14 DIAGNOSIS — D126 Benign neoplasm of colon, unspecified: Secondary | ICD-10-CM

## 2018-09-14 MED ORDER — LINACLOTIDE 145 MCG PO CAPS: 145.0000 ug | ORAL_CAPSULE | Freq: Every day | ORAL | 5 refills | Status: DC

## 2018-09-14 NOTE — Progress Notes (Signed)
Telephone NOte This visit type was conducted due to national recommendations for restrictions regarding the COVID-19 pandemic (e.g. social distancing).  This format is felt to be most appropriate for this patient at this time.  All issues noted in this document were discussed and addressed.  No physical exam was performed (except for noted visual exam findings with Video Visits).   I connected with@ on 09/14/18 at  4:30 PM EDT by  telephone and verified that I am speaking with the correct person using two identifiers. Location patient: home Location provider: work or home office Persons participating in the virtual visit: patient, provider  I discussed the limitations, risks, security and privacy concerns of performing an evaluation and management service by telephone and the availability of in person appointments. I also discussed with the patient that there may be a patient responsible charge related to this service. The patient expressed understanding and agreed to proceed.  Reason for visit: chronic constipation  HPI:  48 yr old with type 2 DM, well controlled,  tubular adenoma by   July 2019 colonoscopy, presents for follow up on chronic constipation.  Using citrucel , stool softener and yogurt probiotic  daily, drinking 6 bottles of water daily,  Continues to have constipation lasting 4-5 days  Without BM.  Feels like the stool is in rectum but cannot get bowels to move.  Does not want to  use glycerin  Suppositories or any pr medication   Discussed trial of linzess    ROS: See pertinent positives and negatives per HPI.  Past Medical History:  Diagnosis Date  . CHF (congestive heart failure) ([place])   . Diabetes mellitus without complication ([place])   . DVT (deep venous thrombosis) ([place]) 04/2017  . GERD (gastroesophageal reflux disease)   . History of migraine    none in over 2 yrs  . Hyperlipidemia   . Hypertension   . Syncope and collapse    x1 - approx 2-3 yrs ago - dehydration     Past Surgical History:  Procedure Laterality Date  . COLONOSCOPY    . COLONOSCOPY WITH PROPOFOL N/A 08/06/2017   Procedure: COLONOSCOPY WITH PROPOFOL;  Surgeon: Lucilla Lame, MD;  Location: [place];  Service: Endoscopy;  Laterality: N/A;  Diabetic - oral meds  . ESOPHAGEAL DILATION  09/24/2016   Procedure: ESOPHAGEAL DILATION;  Surgeon: Lucilla Lame, MD;  Location: [place];  Service: Gastroenterology;;  . ESOPHAGOGASTRODUODENOSCOPY (EGD) WITH PROPOFOL N/A 04/08/2015   Procedure: ESOPHAGOGASTRODUODENOSCOPY (EGD) WITH PROPOFOL with dialation;  Surgeon: Lucilla Lame, MD;  Location: [place];  Service: Endoscopy;  Laterality: N/A;  diabetic - oral meds  . ESOPHAGOGASTRODUODENOSCOPY (EGD) WITH PROPOFOL N/A 09/24/2016   Procedure: ESOPHAGOGASTRODUODENOSCOPY (EGD) WITH PROPOFOL WITH DILATION;  Surgeon: Lucilla Lame, MD;  Location: [place];  Service: Gastroenterology;  Laterality: N/A;  Diabetic - oral meds  . FINGER AMPUTATION  2009   partial removal left index finger   . POLYPECTOMY  08/06/2017   Procedure: POLYPECTOMY INTESTINAL;  Surgeon: Lucilla Lame, MD;  Location: [place];  Service: Endoscopy;;    Family History  Problem Relation Age of Onset  . Heart disease Father        CAD  . Hypertension Father   . Diabetes Father   . Hypertension Sister   . Hypertension Brother   . Hypertension Brother   . Hypertension Mother     SOCIAL HX:  reports that he has been smoking cigarettes. He has a 54.00 pack-year smoking  history. He has never used smokeless tobacco. He reports that he does not drink alcohol or use drugs.   Current Outpatient Medications:  .  amLODipine (NORVASC) 10 MG tablet, Take 1 tablet (10 mg total) by mouth daily., Disp: 90 tablet, Rfl: 1 .  cyanocobalamin 1000 MCG tablet, Take 100 mcg by mouth daily., Disp: , Rfl:  .  glucose blood (ONETOUCH VERIO) test strip, Test sugars twice daily, Disp: 50 each, Rfl: 6 .   Lactulose 20 GM/30ML SOLN, 30 ml every 4 hours until constipation is relieved, Disp: 236 mL, Rfl: 3 .  metFORMIN (GLUCOPHAGE-XR) 500 MG 24 hr tablet, Take 2 tablets (1,000 mg total) by mouth daily., Disp: 180 tablet, Rfl: 3 .  ONETOUCH DELICA LANCETS 78I MISC, Test sugars twice daily, Disp: 50 each, Rfl: 3 .  pantoprazole (PROTONIX) 40 MG tablet, TAKE 1 TABLET(40 MG) BY MOUTH DAILY, Disp: 30 tablet, Rfl: 5 .  saxagliptin HCl (ONGLYZA) 5 MG TABS tablet, Take 1 tablet (5 mg total) by mouth daily., Disp: 30 tablet, Rfl: 11 .  fenofibrate 54 MG tablet, TAKE 1 TABLET(54 MG) BY MOUTH DAILY (Patient not taking: Reported on 09/14/2018), Disp: 90 tablet, Rfl: 0 .  linaclotide (LINZESS) 145 MCG CAPS capsule, Take 1 capsule (145 mcg total) by mouth daily before breakfast., Disp: 30 capsule, Rfl: 5 .  traMADol-acetaminophen (ULTRACET) 37.5-325 MG tablet, Take 1 tablet by mouth at bedtime. (Patient not taking: Reported on 09/14/2018), Disp: 30 tablet, Rfl: 5  EXAM:   General impression: alert, cooperative and articulate.  No signs of being in distress  Lungs: speech is fluent sentence length suggests that patient is not short of breath and not punctuated by cough, sneezing or sniffing. Marland Kitchen   Psych: affect normal.  speech is articulate and non pressured .  Denies suicidal thoughts    ASSESSMENT AND PLAN:  Discussed the following assessment and plan:  Diabetes mellitus with microalbuminuric diabetic nephropathy ([place]) - Plan: Ambulatory referral to Endocrinology, Lipid panel, Hemoglobin A1c  Pituitary insufficiency (HCC), Chronic  Chronic idiopathic constipation  Tubular adenoma of colon  Essential hypertension - Plan: Comprehensive metabolic panel  Fatigue, unspecified type - Plan: TSH, CBC with Differential/Platelet  Constipation Episodic.  Advised in July 2019 to use citrucel.  Recent colonoscopy July 2019 normal except for hyperplastic polyps . Improved transiently after using bowel prep as a  cathartic, then recurred despite use of citrucel and mg daily.  Trial of Linzess.    Tubular adenoma of colon Recurrent, found again on 2019 colonoscopy done because of new onset constipation.  Repeat due in 2024.   Essential hypertension Not well controlled on current regimen based on last several office readings. Advised to check BP at home and submit readings for medication adjustment . Renal function assessment is due.   Lab Results  Component Value Date   CREATININE 1.00 10/13/2017   Lab Results  Component Value Date   MICROALBUR 2.8 (H) 02/23/2017     I discussed the assessment and treatment plan with the patient. The patient was provided an opportunity to ask questions and all were answered. The patient agreed with the plan and demonstrated an understanding of the instructions.   The patient was advised to call back or seek an in-person evaluation if the symptoms worsen or if the condition fails to improve as anticipated.  I provided 25 minutes of non-face-to-face time during this encounter.   [name], MD

## 2018-09-15 NOTE — Assessment & Plan Note (Signed)
Episodic.  Advised in July 2019 to use citrucel.  Recent colonoscopy July 2019 normal except for hyperplastic polyps . Improved transiently after using bowel prep as a cathartic, then recurred despite use of citrucel and mg daily.  Trial of Linzess.

## 2018-09-15 NOTE — Assessment & Plan Note (Signed)
Not well controlled on current regimen based on last several office readings. Advised to check BP at home and submit readings for medication adjustment . Renal function assessment is due.   Lab Results  Component Value Date   CREATININE 1.00 10/13/2017   Lab Results  Component Value Date   MICROALBUR 2.8 (H) 02/23/2017

## 2018-09-15 NOTE — Assessment & Plan Note (Signed)
Recurrent, found again on 2019 colonoscopy done because of new onset constipation.  Repeat due in 2024.

## 2018-09-16 ENCOUNTER — Other Ambulatory Visit: Payer: Self-pay | Admitting: Internal Medicine

## 2018-09-16 ENCOUNTER — Telehealth: Payer: Self-pay | Admitting: Internal Medicine

## 2018-09-16 MED ORDER — LACTULOSE 20 GM/30ML PO SOLN: ORAL | 3 refills | Status: DC

## 2018-09-16 NOTE — Telephone Encounter (Signed)
Can you try to get PA for Linzess ?

## 2018-09-21 NOTE — Telephone Encounter (Signed)
Pa has been submitted on covermymeds.

## 2018-10-06 IMAGING — US US EXTREM LOW VENOUS*L*
1 series · 13 of 24 positions shown · non-contrast
Comparison: None.

CLINICAL DATA: Left lower extremity pain and swelling for 2 weeks.



[Series 1: us extrem low venous*left* · 0.07mm/px · 13 of 36 slices shown]
[im 1/36]
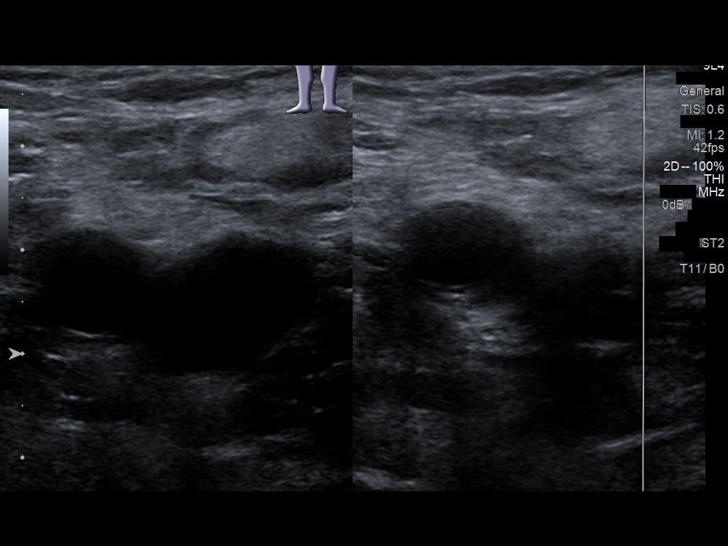
[im 4/36]
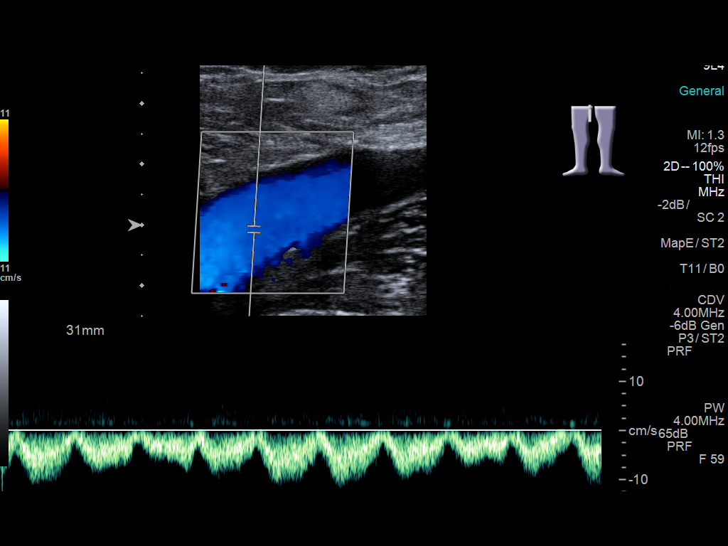
[im 7/36]
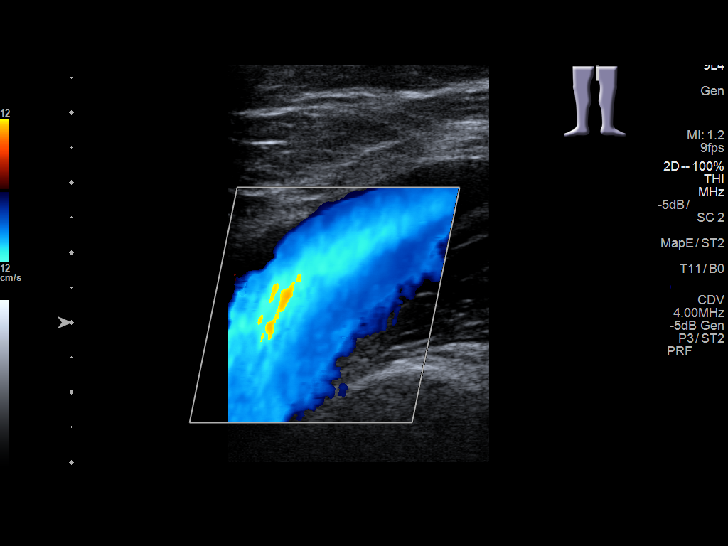
[im 10/36]
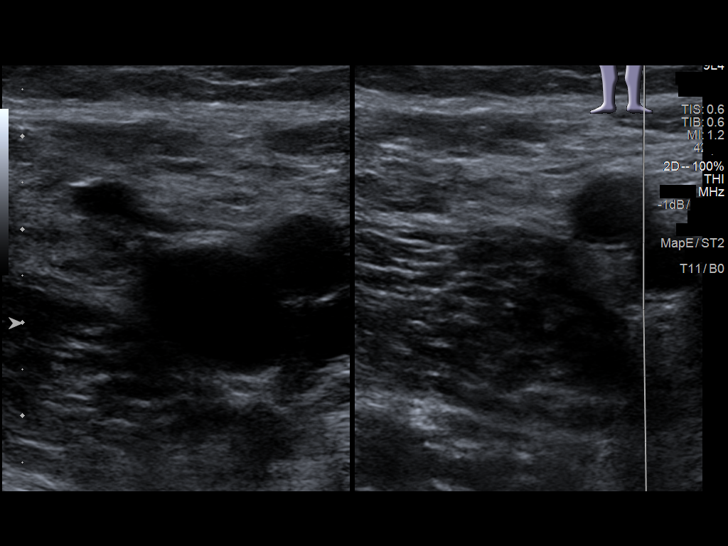
[im 13/36]
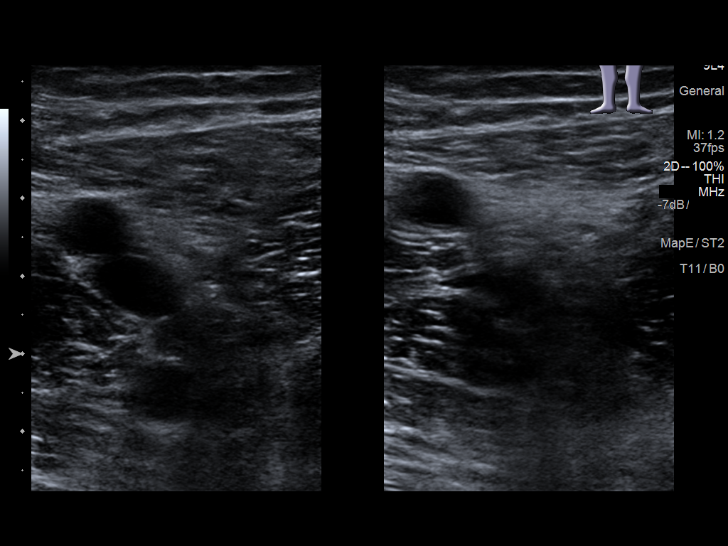
[im 16/36]
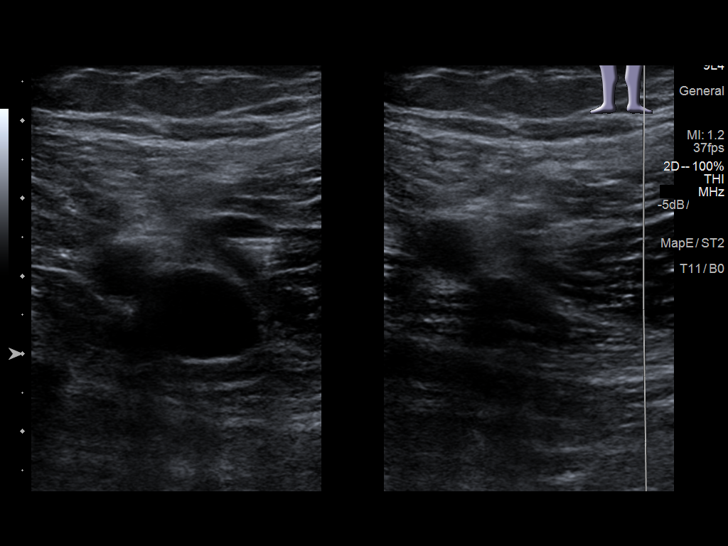
[im 19/36]
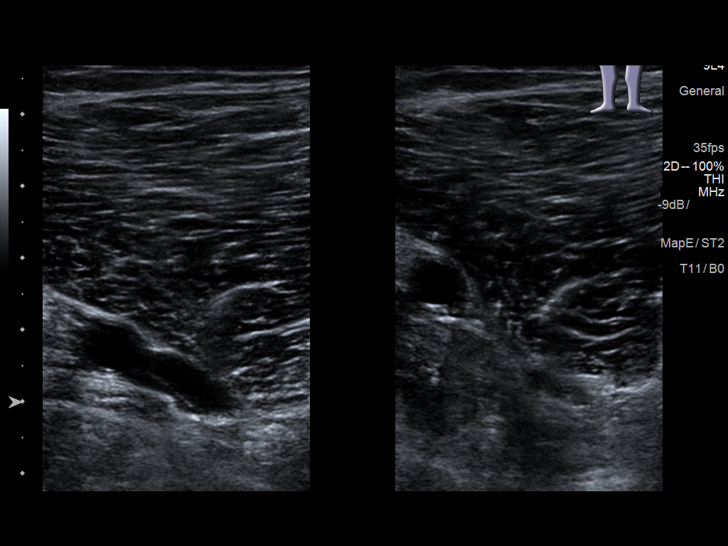
[im 20/36]
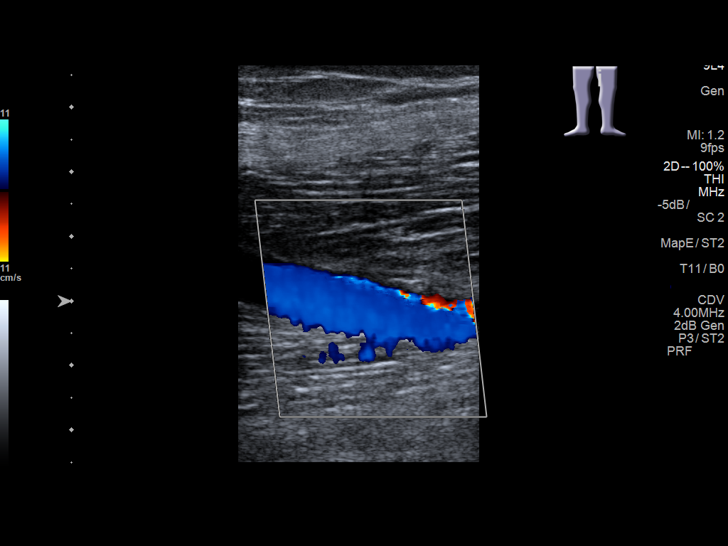
[im 23/36]
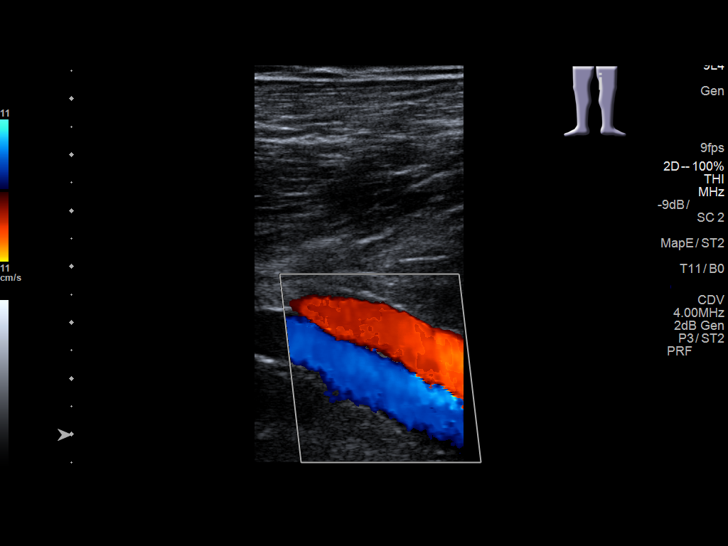
[im 26/36]
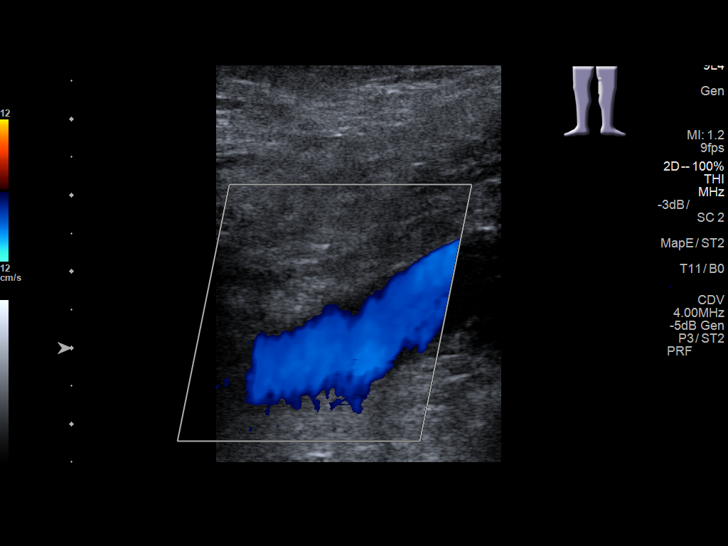
[im 29/36]
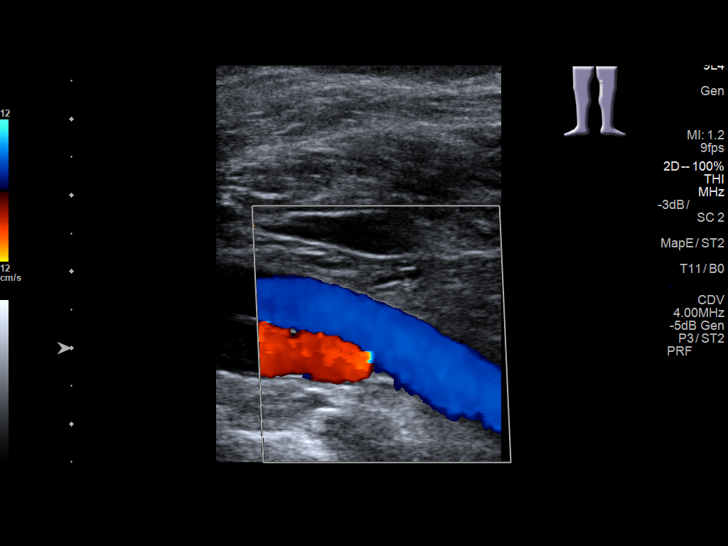
[im 32/36]
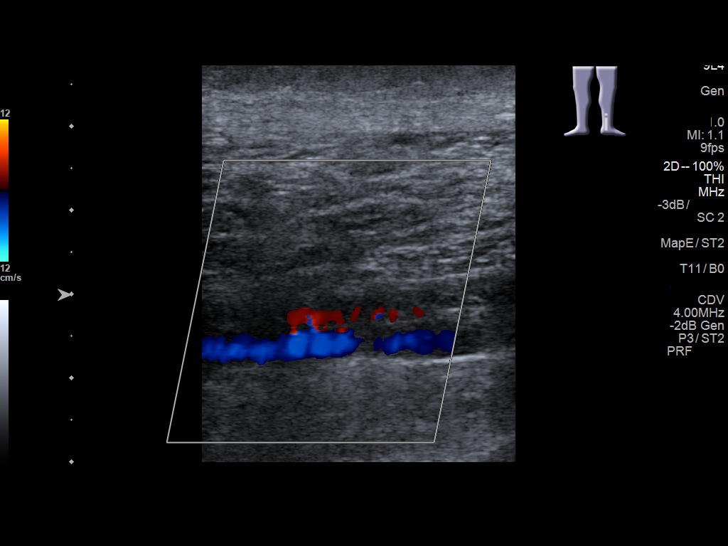
[im 36/36]
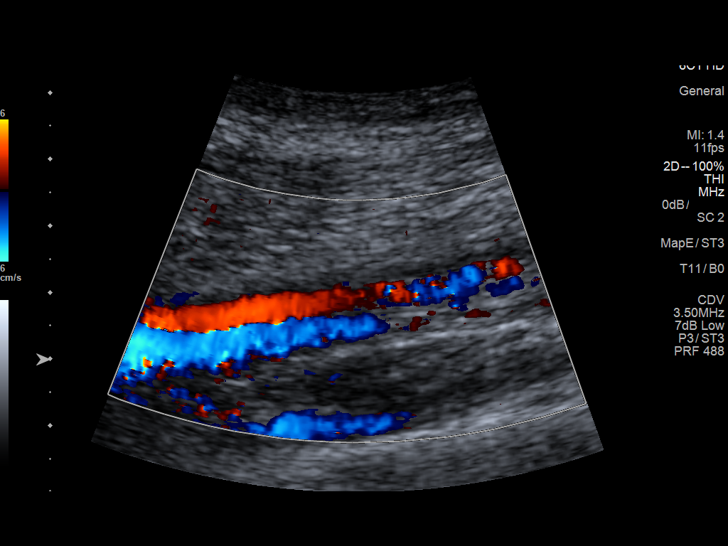

[13 of 24 positions shown; findings below may reference images not displayed]

FINDINGS: Contralateral Common Femoral Vein: Respiratory phasicity is normal
and symmetric with the symptomatic side. No evidence of thrombus.
Normal compressibility.

Common Femoral Vein: No evidence of thrombus. Normal
compressibility, respiratory phasicity and response to augmentation.

Saphenofemoral Junction: No evidence of thrombus. Normal
compressibility and flow on color Doppler imaging.

Profunda Femoral Vein: No evidence of thrombus. Normal
compressibility and flow on color Doppler imaging.

Femoral Vein: No evidence of thrombus. Normal compressibility,
respiratory phasicity and response to augmentation.

Popliteal Vein: No evidence of thrombus. Normal compressibility,
respiratory phasicity and response to augmentation.

Calf Veins: Occlusive clot noted in the anterior and posterior
tibial veins. The peroneal vein is patent.

Superficial Great Saphenous Vein: No evidence of thrombus. Normal
compressibility.

Venous Reflux:  None.

Other Findings:  None.
IMPRESSION: Occlusive clot noted in the anterior and posterior tibial calf
veins.

## 2018-10-17 ENCOUNTER — Other Ambulatory Visit: Payer: Self-pay

## 2018-10-18 ENCOUNTER — Encounter: Payer: Self-pay | Admitting: Internal Medicine

## 2018-10-18 ENCOUNTER — Ambulatory Visit (INDEPENDENT_AMBULATORY_CARE_PROVIDER_SITE_OTHER): Payer: BC Managed Care – PPO | Admitting: Internal Medicine

## 2018-10-18 ENCOUNTER — Other Ambulatory Visit: Payer: Self-pay

## 2018-10-18 VITALS — BP 130/82 | HR 81 | Temp 97.5°F | Resp 14 | Ht 73.0 in | Wt 205.4 lb

## 2018-10-18 DIAGNOSIS — Z0000 Encounter for general adult medical examination without abnormal findings: Secondary | ICD-10-CM | POA: Diagnosis not present

## 2018-10-18 DIAGNOSIS — E1121 Type 2 diabetes mellitus with diabetic nephropathy: Secondary | ICD-10-CM

## 2018-10-18 DIAGNOSIS — I10 Essential (primary) hypertension: Secondary | ICD-10-CM

## 2018-10-18 DIAGNOSIS — K5904 Chronic idiopathic constipation: Secondary | ICD-10-CM

## 2018-10-18 DIAGNOSIS — K635 Polyp of colon: Secondary | ICD-10-CM

## 2018-10-18 DIAGNOSIS — E782 Mixed hyperlipidemia: Secondary | ICD-10-CM | POA: Diagnosis not present

## 2018-10-18 DIAGNOSIS — R5383 Other fatigue: Secondary | ICD-10-CM | POA: Diagnosis not present

## 2018-10-18 DIAGNOSIS — Z23 Encounter for immunization: Secondary | ICD-10-CM

## 2018-10-18 DIAGNOSIS — E1169 Type 2 diabetes mellitus with other specified complication: Secondary | ICD-10-CM | POA: Diagnosis not present

## 2018-10-18 DIAGNOSIS — K2960 Other gastritis without bleeding: Secondary | ICD-10-CM

## 2018-10-18 DIAGNOSIS — C61 Malignant neoplasm of prostate: Secondary | ICD-10-CM

## 2018-10-18 DIAGNOSIS — T39395A Adverse effect of other nonsteroidal anti-inflammatory drugs [NSAID], initial encounter: Secondary | ICD-10-CM

## 2018-10-18 LAB — COMPREHENSIVE METABOLIC PANEL
ALT: 30 U/L (ref 0–53)
AST: 15 U/L (ref 0–37)
Albumin: 4.6 g/dL (ref 3.5–5.2)
Alkaline Phosphatase: 98 U/L (ref 39–117)
BUN: 10 mg/dL (ref 6–23)
CO2: 27 mEq/L (ref 19–32)
Calcium: 9.6 mg/dL (ref 8.4–10.5)
Chloride: 102 mEq/L (ref 96–112)
Creatinine, Ser: 0.94 mg/dL (ref 0.40–1.50)
GFR: 102.59 mL/min (ref >60.00)
Glucose, Bld: 107 mg/dL — ABNORMAL HIGH (ref 70–99)
Potassium: 3.5 mEq/L (ref 3.5–5.1)
Sodium: 139 mEq/L (ref 135–145)
Total Bilirubin: 0.8 mg/dL (ref 0.2–1.2)
Total Protein: 6.7 g/dL (ref 6.0–8.3)

## 2018-10-18 LAB — CBC WITH DIFFERENTIAL/PLATELET
Basophils Absolute: 0 10*3/uL (ref 0.0–0.1)
Basophils Relative: 0.4 % (ref 0.0–3.0)
Eosinophils Absolute: 0.1 10*3/uL (ref 0.0–0.7)
Eosinophils Relative: 1.2 % (ref 0.0–5.0)
HCT: 37.2 % — ABNORMAL LOW (ref 39.0–52.0)
Hemoglobin: 12.9 g/dL — ABNORMAL LOW (ref 13.0–17.0)
Lymphocytes Relative: 17.3 % (ref 12.0–46.0)
Lymphs Abs: 1.4 10*3/uL (ref 0.7–4.0)
MCHC: 34.7 g/dL (ref 30.0–36.0)
MCV: 80.8 fl (ref 78.0–100.0)
Monocytes Absolute: 0.4 10*3/uL (ref 0.1–1.0)
Monocytes Relative: 5.1 % (ref 3.0–12.0)
Neutro Abs: 6.1 10*3/uL (ref 1.4–7.7)
Neutrophils Relative %: 76 % (ref 43.0–77.0)
Platelets: 284 10*3/uL (ref 150.0–400.0)
RBC: 4.6 Mil/uL (ref 4.22–5.81)
RDW: 13.8 % (ref 11.5–15.5)
WBC: 8 10*3/uL (ref 4.0–10.5)

## 2018-10-18 LAB — LIPID PANEL
Cholesterol: 239 mg/dL — ABNORMAL HIGH (ref 0–200)
HDL: 24.3 mg/dL — ABNORMAL LOW (ref >39.00)
NonHDL: 214.56
Total CHOL/HDL Ratio: 10
Triglycerides: 269 mg/dL — ABNORMAL HIGH (ref 0.0–149.0)
VLDL: 53.8 mg/dL — ABNORMAL HIGH (ref 0.0–40.0)

## 2018-10-18 LAB — PSA: PSA: 0.69 ng/mL (ref 0.10–4.00)

## 2018-10-18 LAB — LDL CHOLESTEROL, DIRECT: Direct LDL: 176 mg/dL

## 2018-10-18 LAB — HEMOGLOBIN A1C: Hgb A1c MFr Bld: 7 % — ABNORMAL HIGH (ref 4.6–6.5)

## 2018-10-18 LAB — TSH: TSH: 1.38 u[IU]/mL (ref 0.35–4.50)

## 2018-10-18 MED ORDER — PANTOPRAZOLE SODIUM 40 MG PO TBEC: DELAYED_RELEASE_TABLET | ORAL | 3 refills | Status: DC

## 2018-10-18 NOTE — Telephone Encounter (Signed)
Patient notified

## 2018-10-18 NOTE — Progress Notes (Addendum)
Patient ID: Jacob Baker, male    DOB: Dec 19, 1970  Age: 48 y.o. MRN: [id number]  The patient is here for annual preventive  examination and management of other chronic and acute problems.  Colonoscopy July 2019: hyperplastic polyps    The risk factors are reflected in the social history.  The roster of all physicians providing medical care to patient - is listed in the Snapshot section of the chart.  Activities of daily living:  The patient is 100% independent in all ADLs: dressing, toileting, feeding as well as independent mobility  Home safety : The patient has smoke detectors in the home. They wear seatbelts.  There are no firearms at home. There is no violence in the home.   There is no risks for hepatitis, STDs or HIV. There is no   history of blood transfusion. They have no travel history to infectious disease endemic areas of the world.  The patient has seen their dentist in the last six month. They have seen their eye doctor in the last year and has had a change in vision due to presbyopia.    They do not  have excessive sun exposure. Discussed the need for sun protection: hats, long sleeves and use of sunscreen if there is significant sun exposure.   Diet: the importance of a healthy diet is discussed. They do have a healthy diet.  The benefits of regular aerobic exercise were discussed. She walks 4 times per week ,  20 minutes.   Depression screen: there are no signs or vegative symptoms of depression- irritability, change in appetite, anhedonia, sadness/tearfullness.  The following portions of the patient's history were reviewed and updated as appropriate: allergies, current medications, past family history, past medical history,  past surgical history, past social history  and problem list.  Visual acuity was not assessed per patient preference since he has regular follow up with his ophthalmologist. Hearing and body mass index were assessed and reviewed.   During the course  of the visit the patient was educated and counseled about appropriate screening and preventive services including : fall prevention , diabetes screening, nutrition counseling, colorectal cancer screening, and recommended immunizations.    CC: The primary encounter diagnosis was Prostate cancer ([place]). Diagnoses of Need for immunization against influenza, Diabetes mellitus with microalbuminuric diabetic nephropathy ([place]), Fatigue, unspecified type, Essential hypertension, Chronic idiopathic constipation, Encounter for preventive health examination, Gastritis due to nonsteroidal anti-inflammatory drug (NSAID), Hyperplastic polyp of sigmoid colon, and Dyslipidemia with low high density lipoprotein (HDL) cholesterol with hypertriglyceridemia due to type 2 diabetes mellitus ([place]) were also pertinent to this visit.   Chronic constipation:  Has been taking an OTC laxative and an extra probiotic   Right shoulder pain secondary to bursitis treated with steroid injection and   History Jacob Baker has a past medical history of CHF (congestive heart failure) ([place]), Diabetes mellitus without complication ([place]), DVT (deep venous thrombosis) ([place]) (04/2017), GERD (gastroesophageal reflux disease), History of migraine, Hyperlipidemia, Hypertension, and Syncope and collapse.   He has a past surgical history that includes Finger amputation (2009); Colonoscopy; Esophagogastroduodenoscopy (egd) with propofol (N/A, 04/08/2015); Esophagogastroduodenoscopy (egd) with propofol (N/A, 09/24/2016); Esophageal dilation (09/24/2016); Colonoscopy with propofol (N/A, 08/06/2017); and Polypectomy (08/06/2017).   His family history includes Diabetes in his father; Heart disease in his father; Hypertension in his brother, brother, father, mother, and sister.He reports that he has been smoking cigarettes. He has a 54.00 pack-year smoking history. He has never used smokeless tobacco. He reports that he does  not drink alcohol or use  drugs.  Outpatient Medications Prior to Visit  Medication Sig Dispense Refill  . amLODipine (NORVASC) 10 MG tablet Take 1 tablet (10 mg total) by mouth daily. 90 tablet 1  . cyanocobalamin 1000 MCG tablet Take 100 mcg by mouth daily.    Marland Kitchen glucose blood (ONETOUCH VERIO) test strip Test sugars twice daily 50 each 6  . metFORMIN (GLUCOPHAGE-XR) 500 MG 24 hr tablet Take 2 tablets (1,000 mg total) by mouth daily. 180 tablet 3  . ONETOUCH DELICA LANCETS 99991111 MISC Test sugars twice daily 50 each 3  . saxagliptin HCl (ONGLYZA) 5 MG TABS tablet Take 1 tablet (5 mg total) by mouth daily. 30 tablet 11  . pantoprazole (PROTONIX) 40 MG tablet TAKE 1 TABLET(40 MG) BY MOUTH DAILY 30 tablet 5  . fenofibrate 54 MG tablet TAKE 1 TABLET(54 MG) BY MOUTH DAILY (Patient not taking: Reported on 09/14/2018) 90 tablet 0  . Lactulose 20 GM/30ML SOLN 30 ml every 4 hours until constipation is relieved (Patient not taking: Reported on 10/18/2018) 236 mL 3  . linaclotide (LINZESS) 145 MCG CAPS capsule Take 1 capsule (145 mcg total) by mouth daily before breakfast. (Patient not taking: Reported on 10/18/2018) 30 capsule 5  . traMADol-acetaminophen (ULTRACET) 37.5-325 MG tablet Take 1 tablet by mouth at bedtime. (Patient not taking: Reported on 09/14/2018) 30 tablet 5   No facility-administered medications prior to visit.     Review of Systems   Patient denies headache, fevers, malaise, unintentional weight loss, skin rash, eye pain, sinus congestion and sinus pain, sore throat, dysphagia,  hemoptysis , cough, dyspnea, wheezing, chest pain, palpitations, orthopnea, edema, abdominal pain, nausea, melena, diarrhea, constipation, flank pain, dysuria, hematuria, urinary  Frequency, nocturia, numbness, tingling, seizures,  Focal weakness, Loss of consciousness,  Tremor, insomnia, depression, anxiety, and suicidal ideation.      Objective:  BP 130/82 (BP Location: Left Arm, Patient Position: Sitting, Cuff Size: Large)   Pulse 81    Temp (!) 97.5 F (36.4 C) (Temporal)   Resp 14   Ht 6\' 1"  (1.854 m)   Wt 205 lb 6.4 oz (93.2 kg)   SpO2 97%   BMI 27.10 kg/m   Physical Exam   General appearance: alert, cooperative and appears stated age Ears: normal TM's and external ear canals both ears Throat: lips, mucosa, and tongue normal; teeth and gums normal Neck: no adenopathy, no carotid bruit, supple, symmetrical, trachea midline and thyroid not enlarged, symmetric, no tenderness/mass/nodules Back: symmetric, no curvature. ROM normal. No CVA tenderness. Lungs: clear to auscultation bilaterally Heart: regular rate and rhythm, S1, S2 normal, no murmur, click, rub or gallop Abdomen: soft, non-tender; bowel sounds normal; no masses,  no organomegaly Pulses: 2+ and symmetric Skin: Skin color, texture, turgor normal. No rashes or lesions Lymph nodes: Cervical, supraclavicular, and axillary nodes normal.    Assessment & Plan:   Problem List Items Addressed This Visit      Unprioritized   Diabetes mellitus with microalbuminuric diabetic nephropathy ([place])   Essential hypertension   Gastritis due to nonsteroidal anti-inflammatory drug (NSAID)    Advised to continue protonix if he uses an NSAID. Recommended use of tylenol on a daily basis for management of shoulder pain and add NSAID only if needed       Encounter for preventive health examination    age appropriate education and counseling updated, referrals for preventative services and immunizations addressed, dietary and smoking counseling addressed, most recent labs reviewed.  I have  personally reviewed and have noted:  1) the patient's medical and social history 2) The pt's use of alcohol, tobacco, and illicit drugs 3) The patient's current medications and supplements 4) Functional ability including ADL's, fall risk, home safety risk, hearing and visual impairment 5) Diet and physical activities 6) Evidence for depression or mood disorder 7) The patient's height,  weight, and BMI have been recorded in the chart  I have made referrals, and provided counseling and education based on review of the above      Chronic idiopathic constipation    He has tried all otc products without success except for stimulant laxatives. colonoscopy  For same was unrevealing in 2019. Repeat appeal for Linzess forthcoming      Polyp of sigmoid colon    Reviewed with patient.  Follow up colonscopy 2029      Dyslipidemia with low high density lipoprotein (HDL) cholesterol with hypertriglyceridemia due to type 2 diabetes mellitus ([place])    Using the Framingham risk calculator,  his 10 year risk of coronary artery disease is now 50%.. REcommended  trial of crestor last year but he deferred.  Will strongly recommend now  Lab Results  Component Value Date   CHOL 239 (H) 10/18/2018   HDL 24.30 (L) 10/18/2018   LDLCALC 115 (H) 03/23/2013   LDLDIRECT 176.0 10/18/2018   TRIG 269.0 (H) 10/18/2018   CHOLHDL 10 10/18/2018         Relevant Orders   LDL cholesterol, direct (Completed)    Other Visit Diagnoses    Prostate cancer ([place])    -  Primary   Relevant Orders   PSA (Completed)   Need for immunization against influenza       Relevant Orders   Flu Vaccine QUAD 36+ mos IM (Completed)   Fatigue, unspecified type          I have discontinued Jacob Baker's fenofibrate, traMADol-acetaminophen, linaclotide, and Lactulose. I am also having him maintain his OneTouch Delica Lancets 99991111, glucose blood, cyanocobalamin, saxagliptin HCl, metFORMIN, amLODipine, and pantoprazole.  Meds ordered this encounter  Medications  . pantoprazole (PROTONIX) 40 MG tablet    Sig: TAKE 1 TABLET(40 MG) BY MOUTH DAILY    Dispense:  90 tablet    Refill:  3    Medications Discontinued During This Encounter  Medication Reason  . fenofibrate 54 MG tablet Patient has not taken in last 30 days  . linaclotide (LINZESS) 145 MCG CAPS capsule Cost of medication  . traMADol-acetaminophen  (ULTRACET) 37.5-325 MG tablet Patient has not taken in last 30 days  . Lactulose 20 GM/30ML SOLN Patient has not taken in last 30 days  . pantoprazole (PROTONIX) 40 MG tablet Reorder    Follow-up: No follow-ups on file.   [name], MD

## 2018-10-18 NOTE — Patient Instructions (Addendum)
You can add up to 2000 mg of acetominophen (tylenol) every day safely  In divided doses (500 mg every 6 hours  Or 1000 mg every 12 hours.)  You can add once daily meloxicam,  Twice daily  aleve OR 3 times daily ibuprofen (Motrin) if needed   voltaren gel is also a topical  ointment that can be used for joint pain as your anti inflammatory  ONLY ONE ANTI INFLAMMATORY AT A TIME (PICK ONE )   WE WILL ATTEMPT TO GET THE LINZESS AGAIN  IN THE MEANTIME  SEND ME A PICTURE OF THE INGREDIENTS OF THE PRODUCT YOU ARE USING  You can also try magnesium capsules    Health Maintenance, Male Adopting a healthy lifestyle and getting preventive care are important in promoting health and wellness. Ask your health care provider about:  The right schedule for you to have regular tests and exams.  Things you can do on your own to prevent diseases and keep yourself healthy. What should I know about diet, weight, and exercise? Eat a healthy diet   Eat a diet that includes plenty of vegetables, fruits, low-fat dairy products, and lean protein.  Do not eat a lot of foods that are high in solid fats, added sugars, or sodium. Maintain a healthy weight Body mass index (BMI) is a measurement that can be used to identify possible weight problems. It estimates body fat based on height and weight. Your health care provider can help determine your BMI and help you achieve or maintain a healthy weight. Get regular exercise Get regular exercise. This is one of the most important things you can do for your health. Most adults should:  Exercise for at least 150 minutes each week. The exercise should increase your heart rate and make you sweat (moderate-intensity exercise).  Do strengthening exercises at least twice a week. This is in addition to the moderate-intensity exercise.  Spend less time sitting. Even light physical activity can be beneficial. Watch cholesterol and blood lipids Have your blood tested for  lipids and cholesterol at 50 years of age, then have this test every 5 years. You may need to have your cholesterol levels checked more often if:  Your lipid or cholesterol levels are high.  You are older than 50 years of age.  You are at high risk for heart disease. What should I know about cancer screening? Many types of cancers can be detected early and may often be prevented. Depending on your health history and family history, you may need to have cancer screening at various ages. This may include screening for:  Colorectal cancer.  Prostate cancer.  Skin cancer.  Lung cancer. What should I know about heart disease, diabetes, and high blood pressure? Blood pressure and heart disease  High blood pressure causes heart disease and increases the risk of stroke. This is more likely to develop in people who have high blood pressure readings, are of African descent, or are overweight.  Talk with your health care provider about your target blood pressure readings.  Have your blood pressure checked: ? Every 3-5 years if you are 72-24 years of age. ? Every year if you are 32 years old or older.  If you are between the ages of 39 and 35 and are a current or former smoker, ask your health care provider if you should have a one-time screening for abdominal aortic aneurysm (AAA). Diabetes Have regular diabetes screenings. This checks your fasting blood sugar level. Have the screening  done:  Once every three years after age 57 if you are at a normal weight and have a low risk for diabetes.  More often and at a younger age if you are overweight or have a high risk for diabetes. What should I know about preventing infection? Hepatitis B If you have a higher risk for hepatitis B, you should be screened for this virus. Talk with your health care provider to find out if you are at risk for hepatitis B infection. Hepatitis C Blood testing is recommended for:  Everyone born from [birth year] through  1965.  Anyone with known risk factors for hepatitis C. Sexually transmitted infections (STIs)  You should be screened each year for STIs, including gonorrhea and chlamydia, if: ? You are sexually active and are younger than 50 years of age. ? You are older than 50 years of age and your health care provider tells you that you are at risk for this type of infection. ? Your sexual activity has changed since you were last screened, and you are at increased risk for chlamydia or gonorrhea. Ask your health care provider if you are at risk.  Ask your health care provider about whether you are at high risk for HIV. Your health care provider may recommend a prescription medicine to help prevent HIV infection. If you choose to take medicine to prevent HIV, you should first get tested for HIV. You should then be tested every 3 months for as long as you are taking the medicine. Follow these instructions at home: Lifestyle  Do not use any products that contain nicotine or tobacco, such as cigarettes, e-cigarettes, and chewing tobacco. If you need help quitting, ask your health care provider.  Do not use street drugs.  Do not share needles.  Ask your health care provider for help if you need support or information about quitting drugs. Alcohol use  Do not drink alcohol if your health care provider tells you not to drink.  If you drink alcohol: ? Limit how much you have to 0-2 drinks a day. ? Be aware of how much alcohol is in your drink. In the [place] one drink equals one 12 oz bottle of beer (355 mL), one 5 oz glass of wine (148 mL), or one 1 oz glass of hard liquor (44 mL). General instructions  Schedule regular health, dental, and eye exams.  Stay current with your vaccines.  Tell your health care provider if: ? You often feel depressed. ? You have ever been abused or do not feel safe at home. Summary  Adopting a healthy lifestyle and getting preventive care are important in promoting  health and wellness.  Follow your health care provider's instructions about healthy diet, exercising, and getting tested or screened for diseases.  Follow your health care provider's instructions on monitoring your cholesterol and blood pressure. This information is not intended to replace advice given to you by your health care provider. Make sure you discuss any questions you have with your health care provider. Document Released: 07/11/2007 Document Revised: 01/05/2018 Document Reviewed: 01/05/2018 Elsevier Patient Education  2020 [employer].

## 2018-10-18 NOTE — Assessment & Plan Note (Signed)
He has tried all otc products without success except for stimulant laxatives. colonoscopy  For same was unrevealing in 2019. Repeat appeal for Linzess forthcoming 

## 2018-10-18 NOTE — Assessment & Plan Note (Signed)

## 2018-10-18 NOTE — Assessment & Plan Note (Signed)
Advised to continue protonix if he uses an NSAID. Recommended use of tylenol on a daily basis for management of shoulder pain and add NSAID only if needed

## 2018-10-18 NOTE — Telephone Encounter (Signed)
PA for linzess has been denied.

## 2018-10-18 NOTE — Assessment & Plan Note (Signed)
Reviewed with patient.  Follow up colonscopy 2029

## 2018-10-19 NOTE — Assessment & Plan Note (Signed)
Using the Framingham risk calculator,  his 10 year risk of coronary artery disease is now 50%.. REcommended  trial of crestor last year but he deferred.  Will strongly recommend now  Lab Results  Component Value Date   CHOL 239 (H) 10/18/2018   HDL 24.30 (L) 10/18/2018   LDLCALC 115 (H) 03/23/2013   LDLDIRECT 176.0 10/18/2018   TRIG 269.0 (H) 10/18/2018   CHOLHDL 10 10/18/2018

## 2018-10-24 ENCOUNTER — Other Ambulatory Visit: Payer: Self-pay | Admitting: Internal Medicine

## 2018-10-24 DIAGNOSIS — E1169 Type 2 diabetes mellitus with other specified complication: Secondary | ICD-10-CM

## 2018-10-24 DIAGNOSIS — Z79899 Other long term (current) drug therapy: Secondary | ICD-10-CM

## 2018-10-24 DIAGNOSIS — E782 Mixed hyperlipidemia: Secondary | ICD-10-CM

## 2018-10-24 MED ORDER — ROSUVASTATIN CALCIUM 5 MG PO TABS: 5.0000 mg | ORAL_TABLET | Freq: Every day | ORAL | 5 refills | Status: DC

## 2018-10-24 NOTE — Assessment & Plan Note (Signed)
Agreeable to 2nd trial of statin.  rosuvastarin 5 mg sent. CMET 3 weeks

## 2018-10-26 ENCOUNTER — Other Ambulatory Visit: Payer: Self-pay

## 2018-10-26 ENCOUNTER — Ambulatory Visit (INDEPENDENT_AMBULATORY_CARE_PROVIDER_SITE_OTHER): Payer: BC Managed Care – PPO | Admitting: "Endocrinology

## 2018-10-26 ENCOUNTER — Encounter: Payer: Self-pay | Admitting: "Endocrinology

## 2018-10-26 VITALS — BP 123/85 | HR 86 | Ht 73.0 in | Wt 206.0 lb

## 2018-10-26 DIAGNOSIS — F17200 Nicotine dependence, unspecified, uncomplicated: Secondary | ICD-10-CM | POA: Diagnosis not present

## 2018-10-26 DIAGNOSIS — E782 Mixed hyperlipidemia: Secondary | ICD-10-CM | POA: Insufficient documentation

## 2018-10-26 DIAGNOSIS — E1165 Type 2 diabetes mellitus with hyperglycemia: Secondary | ICD-10-CM | POA: Insufficient documentation

## 2018-10-26 MED ORDER — ROSUVASTATIN CALCIUM 10 MG PO TABS: 10.0000 mg | ORAL_TABLET | Freq: Every day | ORAL | 1 refills | Status: DC

## 2018-10-26 NOTE — Patient Instructions (Signed)

## 2018-10-26 NOTE — Progress Notes (Signed)
Endocrinology Consult Note       10/26/2018, 4:43 PM   Subjective:    Patient ID: Jacob Baker, male    DOB: 11-May-1970.  Jacob Baker is being seen in consultation for management of currently uncontrolled symptomatic diabetes requested by  [name], MD.   Past Medical History:  Diagnosis Date  . CHF (congestive heart failure) ([place])   . Diabetes mellitus without complication ([place])   . DVT (deep venous thrombosis) ([place]) 04/2017  . GERD (gastroesophageal reflux disease)   . History of migraine    none in over 2 yrs  . Hyperlipidemia   . Hypertension   . Syncope and collapse    x1 - approx 2-3 yrs ago - dehydration    Past Surgical History:  Procedure Laterality Date  . COLONOSCOPY    . COLONOSCOPY WITH PROPOFOL N/A 08/06/2017   Procedure: COLONOSCOPY WITH PROPOFOL;  Surgeon: Lucilla Lame, MD;  Location: [facility];  Service: Endoscopy;  Laterality: N/A;  Diabetic - oral meds  . ESOPHAGEAL DILATION  09/24/2016   Procedure: ESOPHAGEAL DILATION;  Surgeon: Lucilla Lame, MD;  Location: [facility];  Service: Gastroenterology;;  . ESOPHAGOGASTRODUODENOSCOPY (EGD) WITH PROPOFOL N/A 04/08/2015   Procedure: ESOPHAGOGASTRODUODENOSCOPY (EGD) WITH PROPOFOL with dialation;  Surgeon: Lucilla Lame, MD;  Location: [facility];  Service: Endoscopy;  Laterality: N/A;  diabetic - oral meds  . ESOPHAGOGASTRODUODENOSCOPY (EGD) WITH PROPOFOL N/A 09/24/2016   Procedure: ESOPHAGOGASTRODUODENOSCOPY (EGD) WITH PROPOFOL WITH DILATION;  Surgeon: Lucilla Lame, MD;  Location: [facility];  Service: Gastroenterology;  Laterality: N/A;  Diabetic - oral meds  . FINGER AMPUTATION  2009   partial removal left index finger   . POLYPECTOMY  08/06/2017   Procedure: POLYPECTOMY INTESTINAL;  Surgeon: Lucilla Lame, MD;  Location: [facility];  Service: Endoscopy;;    Social History    Socioeconomic History  . Marital status: Married    Spouse name: Not on file  . Number of children: Not on file  . Years of education: Not on file  . Highest education level: Not on file  Occupational History  . Not on file  Social Needs  . Financial resource strain: Not on file  . Food insecurity    Worry: Not on file    Inability: Not on file  . Transportation needs    Medical: Not on file    Non-medical: Not on file  Tobacco Use  . Smoking status: Current Every Day Smoker    Packs/day: 2.00    Years: 27.00    Pack years: 54.00    Types: Cigarettes  . Smokeless tobacco: Never Used  Substance and Sexual Activity  . Alcohol use: No  . Drug use: No  . Sexual activity: Not on file  Lifestyle  . Physical activity    Days per week: Not on file    Minutes per session: Not on file  . Stress: Not on file  Relationships  . Social Herbalist on phone: Not on file    Gets together: Not on file    Attends religious service: Not on file  Active member of club or organization: Not on file    Attends meetings of clubs or organizations: Not on file    Relationship status: Not on file  Other Topics Concern  . Not on file  Social History Narrative  . Not on file    Family History  Problem Relation Age of Onset  . Heart disease Father        CAD  . Hypertension Father   . Diabetes Father   . Hypertension Sister   . Hypertension Brother   . Hypertension Brother   . Hypertension Mother     Outpatient Encounter Medications as of 10/26/2018  Medication Sig  . amLODipine (NORVASC) 10 MG tablet Take 1 tablet (10 mg total) by mouth daily.  . cyanocobalamin 1000 MCG tablet Take 100 mcg by mouth daily.  Marland Kitchen glucose blood (ONETOUCH VERIO) test strip Test sugars twice daily  . metFORMIN (GLUCOPHAGE-XR) 500 MG 24 hr tablet Take 2 tablets (1,000 mg total) by mouth daily.  Glory Rosebush DELICA LANCETS [id number] MISC Test sugars twice daily  . pantoprazole (PROTONIX) 40 MG tablet  TAKE 1 TABLET(40 MG) BY MOUTH DAILY  . rosuvastatin (CRESTOR) 10 MG tablet Take 1 tablet (10 mg total) by mouth at bedtime.  . [DISCONTINUED] rosuvastatin (CRESTOR) 5 MG tablet Take 1 tablet (5 mg total) by mouth daily.  . [DISCONTINUED] saxagliptin HCl (ONGLYZA) 5 MG TABS tablet Take 1 tablet (5 mg total) by mouth daily.   No facility-administered encounter medications on file as of 10/26/2018.     ALLERGIES: No Known Allergies  VACCINATION STATUS: Immunization History  Administered Date(s) Administered  . Influenza Split 12/15/2013  . Influenza,inj,Quad PF,6+ Mos 10/26/2014, 10/14/2015, 10/13/2017, 10/18/2018  . Influenza-Unspecified 09/26/2016  . Pneumococcal Polysaccharide-23 10/14/2015  . Tdap 10/26/2014    Diabetes He presents for his initial diabetic visit. He has type 2 diabetes mellitus. His disease course has been improving. There are no hypoglycemic associated symptoms. Pertinent negatives for hypoglycemia include no confusion, headaches, pallor or seizures. There are no diabetic associated symptoms. Pertinent negatives for diabetes include no chest pain, no fatigue, no polydipsia, no polyphagia, no polyuria and no weakness. Symptoms are improving. There are no diabetic complications. Risk factors for coronary artery disease include dyslipidemia, diabetes mellitus, male sex, family history and tobacco exposure. Current diabetic treatment includes oral agent (dual therapy). His weight is fluctuating minimally. He is following a generally unhealthy diet. When asked about meal planning, he reported none. He has not had a previous visit with a dietitian. He rarely participates in exercise.  Hyperlipidemia This is a chronic problem. The current episode started more than 1 year ago. The problem is uncontrolled. Exacerbating diseases include diabetes. Pertinent negatives include no chest pain, myalgias or shortness of breath. Current antihyperlipidemic treatment includes statins. Risk  factors for coronary artery disease include diabetes mellitus, dyslipidemia, male sex and family history.  Hypertension This is a chronic problem. The current episode started more than 1 year ago. The problem is controlled. Pertinent negatives include no chest pain, headaches, neck pain, palpitations or shortness of breath. Risk factors for coronary artery disease include family history, dyslipidemia, diabetes mellitus, male gender and smoking/tobacco exposure. Past treatments include calcium channel blockers.     Review of Systems  Constitutional: Negative for chills, fatigue, fever and unexpected weight change.  HENT: Negative for dental problem, mouth sores and trouble swallowing.   Eyes: Negative for visual disturbance.  Respiratory: Negative for cough, choking, chest tightness, shortness of breath and wheezing.  Cardiovascular: Negative for chest pain, palpitations and leg swelling.  Gastrointestinal: Negative for abdominal distention, abdominal pain, constipation, diarrhea, nausea and vomiting.  Endocrine: Negative for polydipsia, polyphagia and polyuria.  Genitourinary: Negative for dysuria, flank pain, hematuria and urgency.  Musculoskeletal: Negative for back pain, gait problem, myalgias and neck pain.  Skin: Negative for pallor, rash and wound.  Neurological: Negative for seizures, syncope, weakness, numbness and headaches.  Psychiatric/Behavioral: Negative for confusion and dysphoric mood.    Objective:    BP 123/85   Pulse 86   Ht 6\' 1"  (1.854 m)   Wt 206 lb (93.4 kg)   BMI 27.18 kg/m   Wt Readings from Last 3 Encounters:  10/26/18 206 lb (93.4 kg)  10/18/18 205 lb 6.4 oz (93.2 kg)  09/05/18 209 lb (94.8 kg)     Physical Exam Constitutional:      General: He is not in acute distress.    Appearance: He is well-developed.  HENT:     Head: Normocephalic and atraumatic.  Neck:     Musculoskeletal: Normal range of motion and neck supple.     Thyroid: No thyromegaly.      Trachea: No tracheal deviation.  Cardiovascular:     Rate and Rhythm: Normal rate.     Pulses:          Dorsalis pedis pulses are 1+ on the right side and 1+ on the left side.       Posterior tibial pulses are 1+ on the right side and 1+ on the left side.     Heart sounds: S1 normal and S2 normal. No murmur. No gallop.   Pulmonary:     Effort: Pulmonary effort is normal. No respiratory distress.     Breath sounds: No wheezing.  Abdominal:     General: There is no distension.     Tenderness: There is no abdominal tenderness. There is no guarding.  Musculoskeletal:     Right shoulder: He exhibits no swelling and no deformity.  Skin:    General: Skin is warm and dry.     Findings: No rash.     Nails: There is no clubbing.   Neurological:     Mental Status: He is alert and oriented to person, place, and time.     Cranial Nerves: No cranial nerve deficit.     Sensory: No sensory deficit.     Gait: Gait normal.     Deep Tendon Reflexes: Reflexes are normal and symmetric.  Psychiatric:        Speech: Speech normal.        Behavior: Behavior normal. Behavior is cooperative.        Thought Content: Thought content normal.        Judgment: Judgment normal.      CMP ( most recent) CMP     Component Value Date/Time   NA 139 10/18/2018 1406   NA 139 04/23/2014 1040   NA 138 10/04/2013 2025   K 3.5 10/18/2018 1406   K 3.0 (L) 10/04/2013 2025   CL 102 10/18/2018 1406   CL 104 10/04/2013 2025   CO2 27 10/18/2018 1406   CO2 25 10/04/2013 2025   GLUCOSE 107 (H) 10/18/2018 1406   GLUCOSE 175 (H) 10/04/2013 2025   BUN 10 10/18/2018 1406   BUN 15 04/23/2014 1040   BUN 13 10/04/2013 2025   CREATININE 0.94 10/18/2018 1406   CREATININE 0.91 11/24/2013 1553   CALCIUM 9.6 10/18/2018 1406   CALCIUM 9.2 10/04/2013  2025   PROT 6.7 10/18/2018 1406   PROT 7.4 10/04/2013 2025   ALBUMIN 4.6 10/18/2018 1406   ALBUMIN 4.1 10/04/2013 2025   AST 15 10/18/2018 1406   AST 8 (L) 10/04/2013  2025   ALT 30 10/18/2018 1406   ALT 19 10/04/2013 2025   ALKPHOS 98 10/18/2018 1406   ALKPHOS 114 10/04/2013 2025   BILITOT 0.8 10/18/2018 1406   BILITOT 0.4 10/04/2013 2025   GFRNONAA >60 10/23/2016 1952   GFRNONAA >60 10/04/2013 2025   GFRAA >60 10/23/2016 1952   GFRAA >60 10/04/2013 2025     Diabetic Labs (most recent): Lab Results  Component Value Date   HGBA1C 7.0 (H) 10/18/2018   HGBA1C 7.5 (A) 03/11/2018   HGBA1C 8.2 (A) 10/13/2017     Lipid Panel ( most recent) Lipid Panel     Component Value Date/Time   CHOL 239 (H) 10/18/2018 1406   CHOL 180 03/23/2013 0422   TRIG 269.0 (H) 10/18/2018 1406   TRIG 218 (H) 03/23/2013 0422   HDL 24.30 (L) 10/18/2018 1406   HDL 21 (L) 03/23/2013 0422   CHOLHDL 10 10/18/2018 1406   VLDL 53.8 (H) 10/18/2018 1406   VLDL 44 (H) 03/23/2013 0422   LDLCALC 115 (H) 03/23/2013 0422   LDLDIRECT 176.0 10/18/2018 1406      Lab Results  Component Value Date   TSH 1.38 10/18/2018   TSH 1.07 06/11/2016   TSH 1.66 03/12/2014   TSH 1.140 11/24/2013   TSH 1.42 02/25/2012   FREET4 1.00 04/23/2014      Assessment & Plan:   1. Uncontrolled type 2 diabetes mellitus with hyperglycemia (HCC)  - Jacob Baker has currently uncontrolled symptomatic type 2 DM since  48 years of age,  with most recent A1c of 7 %.  He has progressively improved his A1c from 8.2%.  Recent labs reviewed. - I had a long discussion with him about the progressive nature of diabetes and the pathology behind its complications. -his diabetes is complicated by smoking and he remains at a high risk for more acute and chronic complications which include CAD, CVA, CKD, retinopathy, and neuropathy. These are all discussed in detail with him.  - I have counseled him on diet  and weight management  by adopting a carbohydrate restricted/protein rich diet. Patient is encouraged to switch to  unprocessed or minimally processed     complex starch and increased protein intake  (animal or plant source), fruits, and vegetables. -  he is advised to stick to a routine mealtimes to eat 3 meals  a day and avoid unnecessary snacks ( to snack only to correct hypoglycemia).   - he admits that there is a room for improvement in his food and drink choices. - Suggestion is made for him to avoid simple carbohydrates  from his diet including Cakes, Sweet Desserts, Ice Cream, Soda (diet and regular), Sweet Tea, Candies, Chips, Cookies, Store Bought Juices, Alcohol in Excess of  1-2 drinks a day, Artificial Sweeteners,  Coffee Creamer, and "Sugar-free" Products. This will help patient to have more stable blood glucose profile and potentially avoid unintended weight gain.  - he will be scheduled with Jearld Fenton, RDN, CDE for diabetes education.  - I have approached him with the following individualized plan to manage  his diabetes and patient agrees:   -Based on his current glycemic response, he will not need insulin treatment.  He is willing to engage in lifestyle modification, advised to continue metformin 500  mg ER p.o. twice daily after breakfast and after supper. -He is concerned about cost of Onglyza, advised to finish his current supplies and discontinue.  Will be considered for cheaper options including glipizide if necessary by next visit.  - Specific targets for  A1c;  LDL, HDL, Triglycerides, and  Waist Circumference were discussed with the patient.  2) Blood Pressure /Hypertension:  his blood pressure is  controlled to target.   he is advised to continue amlodipine 10 mg p.o. daily.    3) Lipids/Hyperlipidemia:   Review of his recent lipid panel showed un controlled  LDL at 176 .  he  is advised to increase the dose of his Crestor to 10 mg p.o. daily at bedtime.  Side effects and precautions discussed with him.  4)  Weight/Diet:  Body mass index is 27.18 kg/m.  -    he is not a candidate for weight loss. I discussed with him the fact that loss of 5 - 10% of his  current  body weight will have the most impact on his diabetes management.  Exercise, and detailed carbohydrates information provided  -  detailed on discharge instructions.  5) Chronic Care/Health Maintenance:  -he  is on  Statin medications and  is encouraged to initiate and continue to follow up with Ophthalmology, Dentist,  Podiatrist at least yearly or according to recommendations, and advised to  quit smoking. -He is extensively counseled for smoking cessation.  -  I have recommended yearly flu vaccine and pneumonia vaccine at least every 5 years; moderate intensity exercise for up to 150 minutes weekly; and  sleep for at least 7 hours a day.  - he is  advised to maintain close follow up with [name], MD for primary care needs, as well as his other providers for optimal and coordinated care.  - Time spent with the patient: 45 minutes, of which >50% was spent in obtaining information about his symptoms, reviewing his previous labs/studies, evaluations, and treatments, counseling him about his currently controlled type 2 diabetes, hyperlipidemia, hypertension, and developing plans for long term treatment based on the latest standards of care/guidelines.  Please refer to " Patient Self Inventory" in the Media  tab for reviewed elements of pertinent patient history.  Jacob Baker participated in the discussions, expressed understanding, and voiced agreement with the above plans.  All questions were answered to his satisfaction. he is encouraged to contact clinic should he have any questions or concerns prior to his return visit.  Follow up plan: - Return in about 4 months (around 02/25/2019) for Follow up with Pre-visit Labs, Next Visit A1c in Office.  Glade Lloyd, MD [facility] [address] [place], [place] [address] Phone: [phone]  Fax: [phone]    10/26/2018, 4:43 PM  This note was partially dictated with voice recognition  software. Similar sounding words can be transcribed inadequately or may not  be corrected upon review.

## 2018-11-02 ENCOUNTER — Telehealth: Payer: Self-pay

## 2018-11-02 MED ORDER — TRULANCE 3 MG PO TABS: 3.0000 mg | ORAL_TABLET | Freq: Every day | ORAL | 5 refills | Status: DC

## 2018-11-02 NOTE — Telephone Encounter (Signed)
Resubmitted PA for Linzess and the fax states that it remains denied because the pt has not tried Trulance.

## 2018-11-02 NOTE — Addendum Note (Signed)
Addended by: [name] on: 11/02/2018 01:43 PM   Modules accepted: Orders

## 2018-11-14 ENCOUNTER — Other Ambulatory Visit: Payer: Self-pay | Admitting: Endocrinology

## 2018-11-16 ENCOUNTER — Telehealth: Payer: Self-pay | Admitting: Radiology

## 2018-11-16 NOTE — Telephone Encounter (Signed)
Called patient earlier this morning regarding appointment as noted; left message for patient to call back to confirm.

## 2018-11-16 NOTE — Telephone Encounter (Signed)
DR Aline Brochure told him to come in at noon on Friday via my chart message

## 2018-11-16 NOTE — Telephone Encounter (Signed)
Ok frii at 27

## 2018-11-17 NOTE — Telephone Encounter (Signed)
Done; patient aware of appointment scheduled. 

## 2018-11-17 NOTE — Telephone Encounter (Signed)
Due to patient requesting another day and time; left message again for patient to call to confirm appointment offered.

## 2018-11-18 ENCOUNTER — Telehealth: Payer: Self-pay

## 2018-11-18 ENCOUNTER — Ambulatory Visit: Payer: BC Managed Care – PPO | Admitting: Orthopedic Surgery

## 2018-11-18 NOTE — Telephone Encounter (Signed)
PA for Trulance has been submitted on covermymeds.

## 2018-11-18 NOTE — Telephone Encounter (Signed)
I do not refer without evaluating first

## 2018-11-18 NOTE — Telephone Encounter (Signed)
Pt called back and I was able to further triage. Pt stated that the chest pain he has been having has been going on for about a month and it comes and goes. Pt stated that the pain is located "under or behind my heart". Pt stated that the pain last about 5 to 10 minutes and it feels like a cramp. He stated no SOBr, no left side pain in the arm, neck, jaw, shoulder, no belching or being more gassy.

## 2018-11-23 ENCOUNTER — Other Ambulatory Visit: Payer: Self-pay

## 2018-11-23 ENCOUNTER — Ambulatory Visit: Payer: BC Managed Care – PPO | Admitting: Orthopedic Surgery

## 2018-11-23 VITALS — BP 134/90 | HR 81 | Temp 97.3°F | Ht 73.0 in | Wt 206.0 lb

## 2018-11-23 DIAGNOSIS — G8929 Other chronic pain: Secondary | ICD-10-CM | POA: Diagnosis not present

## 2018-11-23 DIAGNOSIS — M25511 Pain in right shoulder: Secondary | ICD-10-CM | POA: Diagnosis not present

## 2018-11-23 MED ORDER — TRAMADOL-ACETAMINOPHEN 37.5-325 MG PO TABS: 1.0000 | ORAL_TABLET | ORAL | 0 refills | Status: AC | PRN

## 2018-11-23 NOTE — Patient Instructions (Signed)
Your MRI has been ordered.  We will contact your insurance company for approval, Dr Aline Brochure will call with the MRI report

## 2018-11-23 NOTE — Progress Notes (Signed)
Chief Complaint  Patient presents with  . Shoulder Pain    Recheck on right shoulder.    Follow-up visit for Jacob Baker who has continued right shoulder pain despite nonoperative care which is included anti-inflammatories, home exercises and injections on several occasions  He has anterior right shoulder pain and he has noticed weakness and loss of motion in the right shoulder  Review of systems he started to have some neck discomfort but no tingling  His exam shows tenderness over the anterolateral shoulder with only 120 degrees of active flexion 150 degrees of passive flexion with painful arc of motion between 100 - 150 degrees with limited abduction less than 90 degrees  His shoulder still feels stable he has supraspinatus weakness in the empty can position internal/external rotation strength remains normal  Neurovascular exam is intact skin looks good  Encounter Diagnosis  Name Primary?  . Chronic right shoulder pain partial rotator cuff tear Yes    Recommend MRI right shoulder  Start Ultracet for pain  Call results to him

## 2018-11-29 ENCOUNTER — Telehealth: Payer: Self-pay | Admitting: Radiology

## 2018-11-29 DIAGNOSIS — G8929 Other chronic pain: Secondary | ICD-10-CM

## 2018-11-29 NOTE — Telephone Encounter (Addendum)
[employer] will not approve his MRI scan / he has not had physical therapy yet. I left message for him to call back so I can let him know, and put in the order for the therapy if he wants to go.

## 2018-11-29 NOTE — Telephone Encounter (Signed)
Discussed with patient, he will try therapy, he lives in [place] ,faxed to [facility] for him they will call him with appointment, asked him to make appointment for follow up, after 6 weeks of therapy to see how he is feeling and then order MRI if not better  To you Juluis Rainier [employer] will not cover MRI if he has not tried therapy first.

## 2019-02-08 ENCOUNTER — Other Ambulatory Visit: Payer: Self-pay

## 2019-02-08 MED ORDER — AMLODIPINE BESYLATE 10 MG PO TABS: 10.0000 mg | ORAL_TABLET | Freq: Every day | ORAL | 1 refills | Status: DC

## 2019-03-01 ENCOUNTER — Ambulatory Visit: Payer: BC Managed Care – PPO | Admitting: "Endocrinology

## 2019-03-10 DIAGNOSIS — E1165 Type 2 diabetes mellitus with hyperglycemia: Secondary | ICD-10-CM | POA: Diagnosis not present

## 2019-03-10 DIAGNOSIS — E782 Mixed hyperlipidemia: Secondary | ICD-10-CM | POA: Diagnosis not present

## 2019-03-11 LAB — COMPLETE METABOLIC PANEL WITH GFR
AG Ratio: 2.2 (calc) (ref 1.0–2.5)
ALT: 32 U/L (ref 9–46)
AST: 15 U/L (ref 10–35)
Albumin: 4.8 g/dL (ref 3.6–5.1)
Alkaline phosphatase (APISO): 118 U/L (ref 35–144)
BUN: 9 mg/dL (ref 7–25)
CO2: 27 mmol/L (ref 20–32)
Calcium: 9.5 mg/dL (ref 8.6–10.3)
Chloride: 102 mmol/L (ref 98–110)
Creat: 0.79 mg/dL (ref 0.70–1.33)
GFR, Est African American: 121 mL/min/{1.73_m2} (ref >=60)
GFR, Est Non African American: 105 mL/min/{1.73_m2} (ref >=60)
Globulin: 2.2 g/dL (calc) (ref 1.9–3.7)
Glucose, Bld: 205 mg/dL — ABNORMAL HIGH (ref 65–99)
Potassium: 3.9 mmol/L (ref 3.5–5.3)
Sodium: 138 mmol/L (ref 135–146)
Total Bilirubin: 0.6 mg/dL (ref 0.2–1.2)
Total Protein: 7 g/dL (ref 6.1–8.1)

## 2019-03-11 LAB — MICROALBUMIN / CREATININE URINE RATIO
Creatinine, Urine: 80 mg/dL (ref 20–320)
Microalb Creat Ratio: 23 mcg/mg creat (ref <30)
Microalb, Ur: 1.8 mg/dL

## 2019-03-11 LAB — LIPID PANEL
Cholesterol: 240 mg/dL — ABNORMAL HIGH (ref <200)
HDL: 30 mg/dL — ABNORMAL LOW (ref >=40)
LDL Cholesterol (Calc): 167 mg/dL (calc) — ABNORMAL HIGH
Non-HDL Cholesterol (Calc): 210 mg/dL (calc) — ABNORMAL HIGH (ref <130)
Total CHOL/HDL Ratio: 8 (calc) — ABNORMAL HIGH (ref <5.0)
Triglycerides: 264 mg/dL — ABNORMAL HIGH (ref <150)

## 2019-03-22 ENCOUNTER — Encounter: Payer: Self-pay | Admitting: "Endocrinology

## 2019-03-22 ENCOUNTER — Other Ambulatory Visit: Payer: Self-pay

## 2019-03-22 ENCOUNTER — Ambulatory Visit (INDEPENDENT_AMBULATORY_CARE_PROVIDER_SITE_OTHER): Payer: BC Managed Care – PPO | Admitting: "Endocrinology

## 2019-03-22 VITALS — BP 135/89 | HR 92 | Ht 73.0 in | Wt 203.8 lb

## 2019-03-22 DIAGNOSIS — E782 Mixed hyperlipidemia: Secondary | ICD-10-CM

## 2019-03-22 DIAGNOSIS — E1165 Type 2 diabetes mellitus with hyperglycemia: Secondary | ICD-10-CM

## 2019-03-22 LAB — POCT GLYCOSYLATED HEMOGLOBIN (HGB A1C): Hemoglobin A1C: 8.7 % — AB (ref 4.0–5.6)

## 2019-03-22 MED ORDER — GLIPIZIDE ER 5 MG PO TB24: 5.0000 mg | ORAL_TABLET | Freq: Every day | ORAL | 1 refills | Status: DC

## 2019-03-22 MED ORDER — METFORMIN HCL ER (MOD) 1000 MG PO TB24: 1000.0000 mg | ORAL_TABLET | Freq: Every day | ORAL | 1 refills | Status: DC

## 2019-03-22 NOTE — Progress Notes (Signed)
03/22/2019, 5:09 PM   Endocrinology follow-up note  Subjective:    Patient ID: Jacob Baker, male    DOB: 05-12-1970.  Jacob Baker is being seen in follow-up after he was seen in consultation for management of currently uncontrolled symptomatic diabetes requested by  [name], MD.   Past Medical History:  Diagnosis Date  . CHF (congestive heart failure) ([place])   . Diabetes mellitus without complication ([place])   . DVT (deep venous thrombosis) ([place]) 04/2017  . GERD (gastroesophageal reflux disease)   . History of migraine    none in over 2 yrs  . Hyperlipidemia   . Hypertension   . Syncope and collapse    x1 - approx 2-3 yrs ago - dehydration    Past Surgical History:  Procedure Laterality Date  . COLONOSCOPY    . COLONOSCOPY WITH PROPOFOL N/A 08/06/2017   Procedure: COLONOSCOPY WITH PROPOFOL;  Surgeon: Lucilla Lame, MD;  Location: [place];  Service: Endoscopy;  Laterality: N/A;  Diabetic - oral meds  . ESOPHAGEAL DILATION  09/24/2016   Procedure: ESOPHAGEAL DILATION;  Surgeon: Lucilla Lame, MD;  Location: [place];  Service: Gastroenterology;;  . ESOPHAGOGASTRODUODENOSCOPY (EGD) WITH PROPOFOL N/A 04/08/2015   Procedure: ESOPHAGOGASTRODUODENOSCOPY (EGD) WITH PROPOFOL with dialation;  Surgeon: Lucilla Lame, MD;  Location: [place];  Service: Endoscopy;  Laterality: N/A;  diabetic - oral meds  . ESOPHAGOGASTRODUODENOSCOPY (EGD) WITH PROPOFOL N/A 09/24/2016   Procedure: ESOPHAGOGASTRODUODENOSCOPY (EGD) WITH PROPOFOL WITH DILATION;  Surgeon: Lucilla Lame, MD;  Location: [place];  Service: Gastroenterology;  Laterality: N/A;  Diabetic - oral meds  . FINGER AMPUTATION  2009   partial removal left index finger   . POLYPECTOMY  08/06/2017   Procedure: POLYPECTOMY INTESTINAL;  Surgeon: Lucilla Lame, MD;  Location[facility];  Service: Endoscopy;;     Social History   Socioeconomic History  . Marital status: Married    Spouse name: Not on file  . Number of children: Not on file  . Years of education: Not on file  . Highest education level: Not on file  Occupational History  . Not on file  Tobacco Use  . Smoking status: Current Every Day Smoker    Packs/day: 2.00    Years: 27.00    Pack years: 54.00    Types: Cigarettes  . Smokeless tobacco: Never Used  Substance and Sexual Activity  . Alcohol use: No  . Drug use: No  . Sexual activity: Not on file  Other Topics Concern  . Not on file  Social History Narrative  . Not on file   Social Determinants of Health   Financial Resource Strain:   . Difficulty of Paying Living Expenses: Not on file  Food Insecurity:   . Worried About Charity fundraiser in the Last Year: Not on file  . Ran Out of Food in the Last Year: Not on file  Transportation Needs:   . Lack of Transportation (Medical): Not on file  . Lack of Transportation (Non-Medical): Not on file  Physical Activity:   . Days of Exercise per Week: Not on file  . Minutes  of Exercise per Session: Not on file  Stress:   . Feeling of Stress : Not on file  Social Connections:   . Frequency of Communication with Friends and Family: Not on file  . Frequency of Social Gatherings with Friends and Family: Not on file  . Attends Religious Services: Not on file  . Active Member of Clubs or Organizations: Not on file  . Attends Archivist Meetings: Not on file  . Marital Status: Not on file    Family History  Problem Relation Age of Onset  . Heart disease Father        CAD  . Hypertension Father   . Diabetes Father   . Hypertension Sister   . Hypertension Brother   . Hypertension Brother   . Hypertension Mother     Outpatient Encounter Medications as of 03/22/2019  Medication Sig  . amLODipine (NORVASC) 10 MG tablet Take 1 tablet (10 mg total) by mouth daily.  . cyanocobalamin 1000 MCG tablet Take 100  mcg by mouth daily.  Marland Kitchen glipiZIDE (GLUCOTROL XL) 5 MG 24 hr tablet Take 1 tablet (5 mg total) by mouth daily with breakfast.  . glucose blood (ONETOUCH VERIO) test strip Test sugars twice daily  . metFORMIN (GLUMETZA) 1000 MG (MOD) 24 hr tablet Take 1 tablet (1,000 mg total) by mouth daily with breakfast.  . ONETOUCH DELICA LANCETS 99991111 MISC Test sugars twice daily  . pantoprazole (PROTONIX) 40 MG tablet TAKE 1 TABLET(40 MG) BY MOUTH DAILY  . Plecanatide (TRULANCE) 3 MG TABS Take 3 mg by mouth daily.  . rosuvastatin (CRESTOR) 10 MG tablet Take 1 tablet (10 mg total) by mouth at bedtime.  . [DISCONTINUED] metFORMIN (GLUCOPHAGE-XR) 500 MG 24 hr tablet Take 2 tablets (1,000 mg total) by mouth daily.   No facility-administered encounter medications on file as of 03/22/2019.    ALLERGIES: No Known Allergies  VACCINATION STATUS: Immunization History  Administered Date(s) Administered  . Influenza Split 12/15/2013  . Influenza,inj,Quad PF,6+ Mos 10/26/2014, 10/14/2015, 10/13/2017, 10/18/2018  . Influenza-Unspecified 09/26/2016  . Pneumococcal Polysaccharide-23 10/14/2015  . Tdap 10/26/2014    Diabetes He presents for his follow-up diabetic visit. He has type 2 diabetes mellitus. His disease course has been worsening. There are no hypoglycemic associated symptoms. Pertinent negatives for hypoglycemia include no confusion, headaches, pallor or seizures. There are no diabetic associated symptoms. Pertinent negatives for diabetes include no chest pain, no fatigue, no polydipsia, no polyphagia, no polyuria and no weakness. Symptoms are worsening. There are no diabetic complications. Risk factors for coronary artery disease include dyslipidemia, diabetes mellitus, male sex, family history and tobacco exposure. Current diabetic treatment includes oral agent (dual therapy). His weight is fluctuating minimally. He is following a generally unhealthy diet. When asked about meal planning, he reported none. He  has not had a previous visit with a dietitian. He rarely participates in exercise. His home blood glucose trend is increasing steadily. (He does not monitor blood glucose regularly.  His point-of-care A1c is 8.7%, increasing from 8.2%. )  Hyperlipidemia This is a chronic problem. The current episode started more than 1 year ago. The problem is uncontrolled. Exacerbating diseases include diabetes. Pertinent negatives include no chest pain, myalgias or shortness of breath. Current antihyperlipidemic treatment includes statins. Risk factors for coronary artery disease include diabetes mellitus, dyslipidemia, male sex and family history.  Hypertension This is a chronic problem. The current episode started more than 1 year ago. The problem is controlled. Pertinent negatives include no chest  pain, headaches, neck pain, palpitations or shortness of breath. Risk factors for coronary artery disease include family history, dyslipidemia, diabetes mellitus, male gender and smoking/tobacco exposure. Past treatments include calcium channel blockers.     Review of Systems  Constitutional: Negative for chills, fatigue, fever and unexpected weight change.  HENT: Negative for dental problem, mouth sores and trouble swallowing.   Eyes: Negative for visual disturbance.  Respiratory: Negative for cough, choking, chest tightness, shortness of breath and wheezing.   Cardiovascular: Negative for chest pain, palpitations and leg swelling.  Gastrointestinal: Negative for abdominal distention, abdominal pain, constipation, diarrhea, nausea and vomiting.  Endocrine: Negative for polydipsia, polyphagia and polyuria.  Genitourinary: Negative for dysuria, flank pain, hematuria and urgency.  Musculoskeletal: Negative for back pain, gait problem, myalgias and neck pain.  Skin: Negative for pallor, rash and wound.  Neurological: Negative for seizures, syncope, weakness, numbness and headaches.  Psychiatric/Behavioral: Negative  for confusion and dysphoric mood.    Objective:    BP 135/89   Pulse 92   Ht 6\' 1"  (1.854 m)   Wt 203 lb 12.8 oz (92.4 kg)   BMI 26.89 kg/m   Wt Readings from Last 3 Encounters:  03/22/19 203 lb 12.8 oz (92.4 kg)  11/23/18 206 lb (93.4 kg)  10/26/18 206 lb (93.4 kg)     Physical Exam Constitutional:      General: He is not in acute distress.    Appearance: He is well-developed.  HENT:     Head: Normocephalic and atraumatic.  Neck:     Thyroid: No thyromegaly.     Trachea: No tracheal deviation.  Cardiovascular:     Rate and Rhythm: Normal rate.     Pulses:          Dorsalis pedis pulses are 1+ on the right side and 1+ on the left side.       Posterior tibial pulses are 1+ on the right side and 1+ on the left side.     Heart sounds: S1 normal and S2 normal. No murmur. No gallop.   Pulmonary:     Effort: Pulmonary effort is normal. No respiratory distress.     Breath sounds: No wheezing.  Abdominal:     General: There is no distension.     Tenderness: There is no abdominal tenderness. There is no guarding.  Musculoskeletal:     Right shoulder: No swelling or deformity.     Cervical back: Normal range of motion and neck supple.  Skin:    General: Skin is warm and dry.     Findings: No rash.     Nails: There is no clubbing.  Neurological:     Mental Status: He is alert and oriented to person, place, and time.     Cranial Nerves: No cranial nerve deficit.     Sensory: No sensory deficit.     Gait: Gait normal.     Deep Tendon Reflexes: Reflexes are normal and symmetric.  Psychiatric:        Speech: Speech normal.        Behavior: Behavior normal. Behavior is cooperative.        Thought Content: Thought content normal.        Judgment: Judgment normal.      CMP ( most recent) CMP     Component Value Date/Time   NA 138 03/10/2019 0934   NA 139 04/23/2014 1040   NA 138 10/04/2013 2025   K 3.9 03/10/2019 0934   K 3.0 (  L) 10/04/2013 2025   CL 102 03/10/2019  0934   CL 104 10/04/2013 2025   CO2 27 03/10/2019 0934   CO2 25 10/04/2013 2025   GLUCOSE 205 (H) 03/10/2019 0934   GLUCOSE 175 (H) 10/04/2013 2025   BUN 9 03/10/2019 0934   BUN 15 04/23/2014 1040   BUN 13 10/04/2013 2025   CREATININE 0.79 03/10/2019 0934   CALCIUM 9.5 03/10/2019 0934   CALCIUM 9.2 10/04/2013 2025   PROT 7.0 03/10/2019 0934   PROT 7.4 10/04/2013 2025   ALBUMIN 4.6 10/18/2018 1406   ALBUMIN 4.1 10/04/2013 2025   AST 15 03/10/2019 0934   AST 8 (L) 10/04/2013 2025   ALT 32 03/10/2019 0934   ALT 19 10/04/2013 2025   ALKPHOS 98 10/18/2018 1406   ALKPHOS 114 10/04/2013 2025   BILITOT 0.6 03/10/2019 0934   BILITOT 0.4 10/04/2013 2025   GFRNONAA 105 03/10/2019 0934   GFRAA 121 03/10/2019 0934     Diabetic Labs (most recent): Lab Results  Component Value Date   HGBA1C 8.7 (A) 03/22/2019   HGBA1C 7.0 (H) 10/18/2018   HGBA1C 7.5 (A) 03/11/2018     Lipid Panel ( most recent) Lipid Panel     Component Value Date/Time   CHOL 240 (H) 03/10/2019 0934   CHOL 180 03/23/2013 0422   TRIG 264 (H) 03/10/2019 0934   TRIG 218 (H) 03/23/2013 0422   HDL 30 (L) 03/10/2019 0934   HDL 21 (L) 03/23/2013 0422   CHOLHDL 8.0 (H) 03/10/2019 0934   VLDL 53.8 (H) 10/18/2018 1406   VLDL 44 (H) 03/23/2013 0422   LDLCALC 167 (H) 03/10/2019 0934   LDLCALC 115 (H) 03/23/2013 0422   LDLDIRECT 176.0 10/18/2018 1406      Lab Results  Component Value Date   TSH 1.38 10/18/2018   TSH 1.07 06/11/2016   TSH 1.66 03/12/2014   TSH 1.140 11/24/2013   TSH 1.42 02/25/2012   FREET4 1.00 04/23/2014      Assessment & Plan:   1. Uncontrolled type 2 diabetes mellitus with hyperglycemia (HCC)  - Jacob Baker has currently uncontrolled symptomatic type 2 DM since  49 years of age. He does not monitor blood glucose regularly.  His point-of-care A1c is 8.7%, increasing from 8.2%.   Recent labs reviewed, showing normal renal function. - I had a long discussion with him about the  progressive nature of diabetes and the pathology behind its complications. -his diabetes is complicated by smoking and he remains at a high risk for more acute and chronic complications which include CAD, CVA, CKD, retinopathy, and neuropathy. These are all discussed in detail with him.  - I have counseled him on diet  and weight management  by adopting a carbohydrate restricted/protein rich diet. Patient is encouraged to switch to  unprocessed or minimally processed     complex starch and increased protein intake (animal or plant source), fruits, and vegetables. -  he is advised to stick to a routine mealtimes to eat 3 meals  a day and avoid unnecessary snacks ( to snack only to correct hypoglycemia).   - he  admits there is a room for improvement in his diet and drink choices. -  Suggestion is made for him to avoid simple carbohydrates  from his diet including Cakes, Sweet Desserts / Pastries, Ice Cream, Soda (diet and regular), Sweet Tea, Candies, Chips, Cookies, Sweet Pastries,  Store Bought Juices, Alcohol in Excess of  1-2 drinks a day, Artificial Sweeteners, Coffee Creamer,  and "Sugar-free" Products. This will help patient to have stable blood glucose profile and potentially avoid unintended weight gain.  - he will be scheduled with Jearld Fenton, RDN, CDE for diabetes education.  - I have approached him with the following individualized plan to manage  his diabetes and patient agrees:   -Based on his current glycemic response, he will not need insulin treatment.  -He promises to do better in his lifestyle modifications.  He is advised to continue Metformin 1000 mg ER daily after breakfast.  -He may benefit from low-dose glipizide.  He did not afford Onglyza.  I discussed and added glipizide 5 mg XL p.o. daily at breakfast.    - Specific targets for  A1c;  LDL, HDL, Triglycerides, and  Waist Circumference were discussed with the patient.  2) Blood Pressure /Hypertension: -His blood  pressure is controlled to target.  He is advised to continue amlodipine 10 mg p.o. daily at breakfast.   3) Lipids/Hyperlipidemia:   Review of his recent repeat lipid panel showed still high LDL of 167.  He has not been consistent taking his Crestor.  He is approached for better consistency with Crestor 10 mg p.o. nightly.    Side effects and precautions discussed with him.  4)  Weight/Diet:  Body mass index is 26.89 kg/m.  -    he is not a candidate for major weight loss. I discussed with him the fact that loss of 5 - 10% of his  current body weight will have the most impact on his diabetes management.  Exercise, and detailed carbohydrates information provided  -  detailed on discharge instructions.  5) Chronic Care/Health Maintenance:  -he  is on  Statin medications and  is encouraged to initiate and continue to follow up with Ophthalmology, Dentist,  Podiatrist at least yearly or according to recommendations, and advised to  quit smoking. -He is extensively counseled for smoking cessation.  -  I have recommended yearly flu vaccine and pneumonia vaccine at least every 5 years; moderate intensity exercise for up to 150 minutes weekly; and  sleep for at least 7 hours a day.  - he is  advised to maintain close follow up with [name], MD for primary care needs, as well as his other providers for optimal and coordinated care.  - Time spent on this patient care encounter:  35 min, of which >50% was spent in  counseling and the rest reviewing his  current and  previous labs/studies ( including abstraction from other facilities),  previous treatments, his blood glucose readings, and medications' doses and developing a plan for long-term care based on the latest recommendations for standards of care; and documenting his care.  Jacob Baker participated in the discussions, expressed understanding, and voiced agreement with the above plans.  All questions were answered to his satisfaction. he is  encouraged to contact clinic should he have any questions or concerns prior to his return visit.   Follow up plan: - Return in about 4 months (around 07/20/2019) for Bring Meter and Logs- A1c in Office.  Glade Lloyd, MD [facility] [address] [place], [place] [address] Phone: [phone]  Fax: [phone]    03/22/2019, 5:09 PM  This note was partially dictated with voice recognition software. Similar sounding words can be transcribed inadequately or may not  be corrected upon review.

## 2019-03-22 NOTE — Patient Instructions (Addendum)

## 2019-03-23 ENCOUNTER — Other Ambulatory Visit: Payer: Self-pay

## 2019-03-23 MED ORDER — METFORMIN HCL ER (MOD) 1000 MG PO TB24: 1000.0000 mg | ORAL_TABLET | Freq: Every day | ORAL | 0 refills | Status: DC

## 2019-03-30 ENCOUNTER — Ambulatory Visit: Payer: Self-pay | Admitting: Internal Medicine

## 2019-04-24 ENCOUNTER — Other Ambulatory Visit: Payer: Self-pay

## 2019-04-26 ENCOUNTER — Ambulatory Visit: Payer: BC Managed Care – PPO | Admitting: Internal Medicine

## 2019-04-26 ENCOUNTER — Other Ambulatory Visit: Payer: Self-pay

## 2019-04-26 ENCOUNTER — Encounter: Payer: Self-pay | Admitting: Internal Medicine

## 2019-04-26 ENCOUNTER — Ambulatory Visit (INDEPENDENT_AMBULATORY_CARE_PROVIDER_SITE_OTHER): Payer: BC Managed Care – PPO

## 2019-04-26 VITALS — BP 144/82 | HR 82 | Temp 96.9°F | Resp 16 | Ht 73.0 in | Wt 204.4 lb

## 2019-04-26 DIAGNOSIS — R0789 Other chest pain: Secondary | ICD-10-CM | POA: Diagnosis not present

## 2019-04-26 DIAGNOSIS — E1165 Type 2 diabetes mellitus with hyperglycemia: Secondary | ICD-10-CM

## 2019-04-26 DIAGNOSIS — I10 Essential (primary) hypertension: Secondary | ICD-10-CM | POA: Diagnosis not present

## 2019-04-26 DIAGNOSIS — E782 Mixed hyperlipidemia: Secondary | ICD-10-CM

## 2019-04-26 DIAGNOSIS — F17200 Nicotine dependence, unspecified, uncomplicated: Secondary | ICD-10-CM

## 2019-04-26 NOTE — Progress Notes (Signed)
Subjective:  Patient ID: Jacob Baker, male    DOB: [birth year]  Age: 49 y.o. MRN: [id number]  CC: The primary encounter diagnosis was Chest tightness. Diagnoses of Essential hypertension, Sensation of chest tightness, Uncontrolled type 2 diabetes mellitus with hyperglycemia (Jacob Baker), and Mixed hyperlipidemia were also pertinent to this visit.  HPI [name] presents for FOLLOW UP on hypertension, and evaluation of chest tightness.  This visit occurred during the SARS-CoV-2 public health emergency.  Safety protocols were in place, including screening questions prior to the visit, additional usage of staff PPE, and extensive cleaning of exam room while observing appropriate contact time as indicated for disinfecting solutions.    patient presents with subacute problems of right shoulder pain and chest tightness ( that has been occurring for the past 4 months >  He is a lifelong  smoker but denies dyspnea,  chronic cough and unintentional Weight loss.  Wake up gasping but also has periods when awake when he feels his "lungs skip a beat " and he has to take a couple of extra breaths .  Denies palpitations,  presyncope, chest pain and  pleurisy .  Wife has described it to him as wheezing,  He also snores but has never had a sleep study    Seeing ortho for right shoulder pain   Mri ordered but not done yet because he has not gone through physical therapy  HTN:  Hypertension: patient checks blood pressure twice weekly at home.  Readings have been for the most part < 130/80 at rest . Patient is following a reduced salt diet most days and is taking medications as prescribed   Outpatient Medications Prior to Visit  Medication Sig Dispense Refill  . amLODipine (NORVASC) 10 MG tablet Take 1 tablet (10 mg total) by mouth daily. 90 tablet 1  . cyanocobalamin 1000 MCG tablet Take 100 mcg by mouth daily.    Marland Kitchen glipiZIDE (GLUCOTROL XL) 5 MG 24 hr tablet Take 1 tablet (5 mg total) by mouth daily with  breakfast. 90 tablet 1  . glucose blood (ONETOUCH VERIO) test strip Test sugars twice daily 50 each 6  . metFORMIN (GLUMETZA) 1000 MG (MOD) 24 hr tablet Take 1 tablet (1,000 mg total) by mouth daily with breakfast. 90 tablet 0  . ONETOUCH DELICA LANCETS 99991111 MISC Test sugars twice daily 50 each 3  . pantoprazole (PROTONIX) 40 MG tablet TAKE 1 TABLET(40 MG) BY MOUTH DAILY 90 tablet 3  . rosuvastatin (CRESTOR) 10 MG tablet Take 1 tablet (10 mg total) by mouth at bedtime. 90 tablet 1  . Plecanatide (TRULANCE) 3 MG TABS Take 3 mg by mouth daily. (Patient not taking: Reported on 04/26/2019) 30 tablet 5   No facility-administered medications prior to visit.    Review of Systems;  Patient denies headache, fevers, malaise, unintentional weight loss, skin rash, eye pain, sinus congestion and sinus pain, sore throat, dysphagia,  hemoptysis , cough, dyspnea, wheezing, chest pain, palpitations, orthopnea, edema, abdominal pain, nausea, melena, diarrhea, constipation, flank pain, dysuria, hematuria, urinary  Frequency, nocturia, numbness, tingling, seizures,  Focal weakness, Loss of consciousness,  Tremor, insomnia, depression, anxiety, and suicidal ideation.      Objective:  BP (!) 144/82 (BP Location: Left Arm, Patient Position: Sitting, Cuff Size: Large)   Pulse 82   Temp (!) 96.9 F (36.1 C) (Temporal)   Resp 16   Ht 6\' 1"  (1.854 m)   Wt 204 lb 6.4 oz (92.7 kg)   SpO2 98%  BMI 26.97 kg/m   BP Readings from Last 3 Encounters:  04/26/19 (!) 144/82  03/22/19 135/89  11/23/18 134/90    Wt Readings from Last 3 Encounters:  04/26/19 204 lb 6.4 oz (92.7 kg)  03/22/19 203 lb 12.8 oz (92.4 kg)  11/23/18 206 lb (93.4 kg)    General appearance: alert, cooperative and appears stated age Ears: normal TM's and external ear canals both ears Throat: lips, mucosa, and tongue normal; teeth and gums normal Neck: no adenopathy, no carotid bruit, supple, symmetrical, trachea midline and thyroid not  enlarged, symmetric, no tenderness/mass/nodules Back: symmetric, no curvature. ROM normal. No CVA tenderness. Lungs: clear to auscultation bilaterally Heart: regular rate and rhythm, S1, S2 normal, no murmur, click, rub or gallop Abdomen: soft, non-tender; bowel sounds normal; no masses,  no organomegaly Pulses: 2+ and symmetric Skin: Skin color, texture, turgor normal. No rashes or lesions Lymph nodes: Cervical, supraclavicular, and axillary nodes normal.  Lab Results  Component Value Date   HGBA1C 8.7 (A) 03/22/2019   HGBA1C 7.0 (H) 10/18/2018   HGBA1C 7.5 (A) 03/11/2018    Lab Results  Component Value Date   CREATININE 0.79 03/10/2019   CREATININE 0.94 10/18/2018   CREATININE 1.00 10/13/2017    Lab Results  Component Value Date   WBC 8.0 10/18/2018   HGB 12.9 (L) 10/18/2018   HCT 37.2 (L) 10/18/2018   PLT 284.0 10/18/2018   GLUCOSE 205 (H) 03/10/2019   CHOL 240 (H) 03/10/2019   TRIG 264 (H) 03/10/2019   HDL 30 (L) 03/10/2019   LDLDIRECT 176.0 10/18/2018   LDLCALC 167 (H) 03/10/2019   ALT 32 03/10/2019   AST 15 03/10/2019   NA 138 03/10/2019   K 3.9 03/10/2019   CL 102 03/10/2019   CREATININE 0.79 03/10/2019   BUN 9 03/10/2019   CO2 27 03/10/2019   TSH 1.38 10/18/2018   PSA 0.69 10/18/2018   INR 0.96 10/13/2011   HGBA1C 8.7 (A) 03/22/2019   MICROALBUR 1.8 03/10/2019    No results found.  Assessment & Plan:   Problem List Items Addressed This Visit      Unprioritized   HTN (hypertension)    he reports compliance with medication regimen  but has an elevated reading today in office.  he is not using NSAIDs daily.  Discussed goal of 120/70  (130/80 for patients over 70)  to preserve renal function.  he has been asked to check her  BP  at home and  submit readings for evaluation. Renal function, electrolytes and screen for proteinuria are all normal .  Lab Results  Component Value Date   CREATININE 0.79 03/10/2019   Lab Results  Component Value Date   NA  138 03/10/2019   K 3.9 03/10/2019   CL 102 03/10/2019   CO2 27 03/10/2019   Lab Results  Component Value Date   MICROALBUR 1.8 03/10/2019   MICROALBUR 2.8 (H) 02/23/2017           Relevant Medications   rosuvastatin (CRESTOR) 20 MG tablet   Uncontrolled type 2 diabetes mellitus with hyperglycemia ([place])    Patient was referred to endocrinologist in September 2020  for uncontrolled T2DM and is seeing Dr Dorris Fetch in [place]. A1c has risen to 8.7 . He could not afford onglyza and has been taking metformin and glipizide .  Dietary nonadherence is a contributor   Lab Results  Component Value Date   HGBA1C 8.7 (A) 03/22/2019         Relevant  Medications   rosuvastatin (CRESTOR) 20 MG tablet   Mixed hyperlipidemia    Managed with rosuvastatin since September for elevated 10 yr risk . LDL is still not at goal.  Will recommend increase to 20 mg daily     Lab Results  Component Value Date   CHOL 240 (H) 03/10/2019   HDL 30 (L) 03/10/2019   LDLCALC 167 (H) 03/10/2019   LDLDIRECT 176.0 10/18/2018   TRIG 264 (H) 03/10/2019   CHOLHDL 8.0 (H) 03/10/2019         Relevant Medications   rosuvastatin (CRESTOR) 20 MG tablet   Sensation of chest tightness    Chest x ray was clear.  History does not suggest ischemia so given his smoking history will begin a trial of albuterol MDI .  If no improvement,  Will see cardiology evaluation given his risk factors.        Other Visit Diagnoses    Chest tightness    -  Primary   Relevant Orders   DG Chest 2 View (Completed)      I have changed Shanon Brow A. Requejo's rosuvastatin. I am also having him maintain his OneTouch Delica Lancets 99991111, glucose blood, cyanocobalamin, pantoprazole, Trulance, amLODipine, glipiZIDE, and metFORMIN.  Meds ordered this encounter  Medications  . rosuvastatin (CRESTOR) 20 MG tablet    Sig: Take 1 tablet (20 mg total) by mouth at bedtime.    Dispense:  90 tablet    Refill:  1    Medications Discontinued  During This Encounter  Medication Reason  . rosuvastatin (CRESTOR) 10 MG tablet    I provided  30 minutes of  face-to-face time during this encounter reviewing patient's current problems and past surgeries, labs and imaging studies, providing counseling on the above mentioned problems , and coordination  of care .  Follow-up: No follow-ups on file.   [name], MD

## 2019-04-26 NOTE — Patient Instructions (Signed)
I'm calling in an albuterol inhaler to try using the next time you have the feeling of chest tightness

## 2019-04-27 ENCOUNTER — Telehealth: Payer: Self-pay | Admitting: Internal Medicine

## 2019-04-27 NOTE — Telephone Encounter (Signed)
Does not look like inhaler was sent in and not sure which one needs to be sent in.

## 2019-04-27 NOTE — Telephone Encounter (Signed)
Pt called and said that at his appt yesterday.. Dr. Demetrios Isaacs was going to call in an inhaler

## 2019-04-28 ENCOUNTER — Other Ambulatory Visit: Payer: Self-pay | Admitting: Internal Medicine

## 2019-04-28 ENCOUNTER — Telehealth: Payer: Self-pay | Admitting: Internal Medicine

## 2019-04-28 ENCOUNTER — Ambulatory Visit: Payer: BC Managed Care – PPO | Attending: Internal Medicine

## 2019-04-28 DIAGNOSIS — R0789 Other chest pain: Secondary | ICD-10-CM | POA: Insufficient documentation

## 2019-04-28 DIAGNOSIS — Z23 Encounter for immunization: Secondary | ICD-10-CM

## 2019-04-28 MED ORDER — ROSUVASTATIN CALCIUM 20 MG PO TABS: 20.0000 mg | ORAL_TABLET | Freq: Every day | ORAL | 1 refills | Status: DC

## 2019-04-28 MED ORDER — ALBUTEROL SULFATE HFA 108 (90 BASE) MCG/ACT IN AERS: 2.0000 | INHALATION_SPRAY | Freq: Four times a day (QID) | RESPIRATORY_TRACT | 1 refills | Status: DC | PRN

## 2019-04-28 NOTE — Assessment & Plan Note (Addendum)
Patient was referred to endocrinologist in September 2020  for uncontrolled T2DM and is seeing Dr Dorris Fetch in [place]. A1c has risen to 8.7 . He could not afford onglyza and has been taking metformin and glipizide .  Dietary nonadherence is a contributor   Lab Results  Component Value Date   HGBA1C 8.7 (A) 03/22/2019

## 2019-04-28 NOTE — Telephone Encounter (Signed)
Albuterol MDI sent to local pharmacy

## 2019-04-28 NOTE — Progress Notes (Signed)
   Covid-19 Vaccination Clinic  Name:  VENTURA HELMES    MRN: [id number] DOB: [birth year]  04/28/2019  Mr. Mishoe was observed post Covid-19 immunization for 15 minutes without incident. He was provided with Vaccine Information Sheet and instruction to access the V-Safe system.   Mr. Sofield was instructed to call 911 with any severe reactions post vaccine: Marland Kitchen Difficulty breathing  . Swelling of face and throat  . A fast heartbeat  . A bad rash all over body  . Dizziness and weakness   Immunizations Administered    Name Date Dose VIS Date Route   Moderna COVID-19 Vaccine 04/28/2019  8:12 AM 0.5 mL 12/27/2018 Intramuscular   Manufacturer: Moderna   Lot: [id number]   [id number]

## 2019-04-28 NOTE — Telephone Encounter (Signed)
My Chart message sent

## 2019-04-28 NOTE — Assessment & Plan Note (Addendum)
Managed with rosuvastatin since September for elevated 10 yr risk . LDL is still not at goal.  Will recommend increase to 20 mg daily     Lab Results  Component Value Date   CHOL 240 (H) 03/10/2019   HDL 30 (L) 03/10/2019   LDLCALC 167 (H) 03/10/2019   LDLDIRECT 176.0 10/18/2018   TRIG 264 (H) 03/10/2019   CHOLHDL 8.0 (H) 03/10/2019

## 2019-04-28 NOTE — Assessment & Plan Note (Signed)
he reports compliance with medication regimen  but has an elevated reading today in office.  he is not using NSAIDs daily.  Discussed goal of 120/70  (130/80 for patients over 70)  to preserve renal function.  he has been asked to check her  BP  at home and  submit readings for evaluation. Renal function, electrolytes and screen for proteinuria are all normal .  Lab Results  Component Value Date   CREATININE 0.79 03/10/2019   Lab Results  Component Value Date   NA 138 03/10/2019   K 3.9 03/10/2019   CL 102 03/10/2019   CO2 27 03/10/2019   Lab Results  Component Value Date   MICROALBUR 1.8 03/10/2019   MICROALBUR 2.8 (H) 02/23/2017

## 2019-04-28 NOTE — Assessment & Plan Note (Signed)
Chest x ray was clear.  History does not suggest ischemia so given his smoking history will begin a trial of albuterol MDI .  If no improvement,  Will see cardiology evaluation given his risk factors.

## 2019-05-03 ENCOUNTER — Telehealth: Payer: Self-pay

## 2019-05-03 NOTE — Telephone Encounter (Signed)
Can you check on pulmonology referral that was ordered on 04/30/2019. Pt stated that he has not heard anything yet. Pt is aware that we he should hear something within a week from the time the referral was placed.

## 2019-05-08 ENCOUNTER — Telehealth: Payer: Self-pay | Admitting: Internal Medicine

## 2019-05-08 NOTE — Telephone Encounter (Signed)
I called pt and left a vm with appt time date and location to pulmonary in [place].

## 2019-05-15 NOTE — Telephone Encounter (Signed)
I called pt and left a vm with appt time date and location to pulmonary in [place] on 04/12

## 2019-05-23 ENCOUNTER — Institutional Professional Consult (permissible substitution): Payer: BC Managed Care – PPO | Admitting: Pulmonary Disease

## 2019-05-30 ENCOUNTER — Ambulatory Visit: Payer: BC Managed Care – PPO | Attending: Internal Medicine

## 2019-05-30 DIAGNOSIS — Z23 Encounter for immunization: Secondary | ICD-10-CM

## 2019-05-30 NOTE — Progress Notes (Signed)
   Covid-19 Vaccination Clinic  Name:  Jacob Baker    MRN: [id number] DOB: December 07, 1970  05/30/2019  [name] was observed post Covid-19 immunization for 15 minutes without incident. He was provided with Vaccine Information Sheet and instruction to access the V-Safe system.   [name] was instructed to call 911 with any severe reactions post vaccine: Marland Kitchen Difficulty breathing  . Swelling of face and throat  . A fast heartbeat  . A bad rash all over body  . Dizziness and weakness   Immunizations Administered    Name Date Dose VIS Date Route   Moderna COVID-19 Vaccine 05/30/2019  8:41 AM 0.5 mL 12/2018 Intramuscular   Manufacturer: Moderna   Lot: [id number]   [place]BE:[id number]

## 2019-06-20 ENCOUNTER — Institutional Professional Consult (permissible substitution): Payer: BC Managed Care – PPO | Admitting: Pulmonary Disease

## 2019-07-20 ENCOUNTER — Other Ambulatory Visit: Payer: Self-pay

## 2019-07-20 ENCOUNTER — Ambulatory Visit: Payer: BC Managed Care – PPO | Admitting: "Endocrinology

## 2019-07-20 ENCOUNTER — Encounter: Payer: Self-pay | Admitting: "Endocrinology

## 2019-07-20 ENCOUNTER — Telehealth: Payer: Self-pay | Admitting: Internal Medicine

## 2019-07-20 VITALS — BP 122/82 | HR 87 | Ht 73.0 in | Wt 202.2 lb

## 2019-07-20 DIAGNOSIS — E1165 Type 2 diabetes mellitus with hyperglycemia: Secondary | ICD-10-CM

## 2019-07-20 DIAGNOSIS — E782 Mixed hyperlipidemia: Secondary | ICD-10-CM | POA: Diagnosis not present

## 2019-07-20 LAB — POCT GLYCOSYLATED HEMOGLOBIN (HGB A1C): Hemoglobin A1C: 6.4 % — AB (ref 4.0–5.6)

## 2019-07-20 MED ORDER — AMLODIPINE BESYLATE 10 MG PO TABS: 10.0000 mg | ORAL_TABLET | Freq: Every day | ORAL | 1 refills | Status: DC

## 2019-07-20 NOTE — Telephone Encounter (Signed)
Patient called and needs a refill on his amLODipine (NORVASC) 10 MG tablet .

## 2019-07-20 NOTE — Patient Instructions (Signed)

## 2019-07-20 NOTE — Progress Notes (Signed)
07/20/2019, 6:49 PM   Endocrinology follow-up note  Subjective:    Patient ID: Jacob Baker, male    DOB: 07-17-1970.  Jacob Baker is being seen in follow-up after he was seen in consultation for management of currently uncontrolled symptomatic diabetes requested by  [name], MD.   Past Medical History:  Diagnosis Date  . CHF (congestive heart failure) ([place])   . Diabetes mellitus without complication ([place])   . DVT (deep venous thrombosis) ([place]) 04/2017  . GERD (gastroesophageal reflux disease)   . History of migraine    none in over 2 yrs  . Hyperlipidemia   . Hypertension   . Syncope and collapse    x1 - approx 2-3 yrs ago - dehydration    Past Surgical History:  Procedure Laterality Date  . COLONOSCOPY    . COLONOSCOPY WITH PROPOFOL N/A 08/06/2017   Procedure: COLONOSCOPY WITH PROPOFOL;  Surgeon: Lucilla Lame, MD;  Location: [facility];  Service: Endoscopy;  Laterality: N/A;  Diabetic - oral meds  . ESOPHAGEAL DILATION  09/24/2016   Procedure: ESOPHAGEAL DILATION;  Surgeon: Lucilla Lame, MD;  Location: [facility];  Service: Gastroenterology;;  . ESOPHAGOGASTRODUODENOSCOPY (EGD) WITH PROPOFOL N/A 04/08/2015   Procedure: ESOPHAGOGASTRODUODENOSCOPY (EGD) WITH PROPOFOL with dialation;  Surgeon: Lucilla Lame, MD;  Location: [facility];  Service: Endoscopy;  Laterality: N/A;  diabetic - oral meds  . ESOPHAGOGASTRODUODENOSCOPY (EGD) WITH PROPOFOL N/A 09/24/2016   Procedure: ESOPHAGOGASTRODUODENOSCOPY (EGD) WITH PROPOFOL WITH DILATION;  Surgeon: Lucilla Lame, MD;  Location: [facility];  Service: Gastroenterology;  Laterality: N/A;  Diabetic - oral meds  . FINGER AMPUTATION  2009   partial removal left index finger   . POLYPECTOMY  08/06/2017   Procedure: POLYPECTOMY INTESTINAL;  Surgeon: Lucilla Lame, MD;  Location[facility];  Service: Endoscopy;;     Social History   Socioeconomic History  . Marital status: Married    Spouse name: Not on file  . Number of children: Not on file  . Years of education: Not on file  . Highest education level: Not on file  Occupational History  . Not on file  Tobacco Use  . Smoking status: Current Every Day Smoker    Packs/day: 2.00    Years: 27.00    Pack years: 54.00    Types: Cigarettes  . Smokeless tobacco: Never Used  Vaping Use  . Vaping Use: Never used  Substance and Sexual Activity  . Alcohol use: No  . Drug use: No  . Sexual activity: Not on file  Other Topics Concern  . Not on file  Social History Narrative  . Not on file   Social Determinants of Health   Financial Resource Strain:   . Difficulty of Paying Living Expenses:   Food Insecurity:   . Worried About Charity fundraiser in the Last Year:   . Arboriculturist in the Last Year:   Transportation Needs:   . Film/video editor (Medical):   Marland Kitchen Lack of Transportation (Non-Medical):   Physical Activity:   . Days of Exercise per Week:   . Minutes of Exercise per  Session:   Stress:   . Feeling of Stress :   Social Connections:   . Frequency of Communication with Friends and Family:   . Frequency of Social Gatherings with Friends and Family:   . Attends Religious Services:   . Active Member of Clubs or Organizations:   . Attends Archivist Meetings:   Marland Kitchen Marital Status:     Family History  Problem Relation Age of Onset  . Heart disease Father        CAD  . Hypertension Father   . Diabetes Father   . Hypertension Sister   . Hypertension Brother   . Hypertension Brother   . Hypertension Mother     Outpatient Encounter Medications as of 07/20/2019  Medication Sig  . albuterol (VENTOLIN HFA) 108 (90 Base) MCG/ACT inhaler Inhale 2 puffs into the lungs every 6 (six) hours as needed for wheezing or shortness of breath.  Marland Kitchen amLODipine (NORVASC) 10 MG tablet Take 1 tablet (10 mg total) by mouth daily.   . cyanocobalamin 1000 MCG tablet Take 100 mcg by mouth daily.  Marland Kitchen glipiZIDE (GLUCOTROL XL) 5 MG 24 hr tablet Take 1 tablet (5 mg total) by mouth daily with breakfast.  . glucose blood (ONETOUCH VERIO) test strip Test sugars twice daily  . metFORMIN (GLUMETZA) 1000 MG (MOD) 24 hr tablet Take 1 tablet (1,000 mg total) by mouth daily with breakfast.  . ONETOUCH DELICA LANCETS 62G MISC Test sugars twice daily  . pantoprazole (PROTONIX) 40 MG tablet TAKE 1 TABLET(40 MG) BY MOUTH DAILY  . Plecanatide (TRULANCE) 3 MG TABS Take 3 mg by mouth daily. (Patient not taking: Reported on 04/26/2019)  . rosuvastatin (CRESTOR) 20 MG tablet Take 1 tablet (20 mg total) by mouth at bedtime.   No facility-administered encounter medications on file as of 07/20/2019.    ALLERGIES: No Known Allergies  VACCINATION STATUS: Immunization History  Administered Date(s) Administered  . Influenza Split 12/15/2013  . Influenza,inj,Quad PF,6+ Mos 10/26/2014, 10/14/2015, 10/13/2017, 10/18/2018  . Influenza-Unspecified 09/26/2016  . Moderna SARS-COVID-2 Vaccination 04/28/2019, 05/30/2019  . Pneumococcal Polysaccharide-23 10/14/2015  . Tdap 10/26/2014    Diabetes He presents for his follow-up diabetic visit. He has type 2 diabetes mellitus. His disease course has been improving. There are no hypoglycemic associated symptoms. Pertinent negatives for hypoglycemia include no confusion, headaches, pallor or seizures. There are no diabetic associated symptoms. Pertinent negatives for diabetes include no chest pain, no fatigue, no polydipsia, no polyphagia, no polyuria and no weakness. Symptoms are improving. There are no diabetic complications. Risk factors for coronary artery disease include dyslipidemia, diabetes mellitus, male sex, family history and tobacco exposure. Current diabetic treatment includes oral agent (dual therapy). His weight is stable. He is following a generally unhealthy diet. When asked about meal planning, he  reported none. He has not had a previous visit with a dietitian. He rarely participates in exercise. (He does not monitor blood glucose regularly.  His point-of-care A1c is 6.4% improving from 8.7%.   )  Hyperlipidemia This is a chronic problem. The current episode started more than 1 year ago. The problem is uncontrolled. Exacerbating diseases include diabetes. Pertinent negatives include no chest pain, myalgias or shortness of breath. Current antihyperlipidemic treatment includes statins. Risk factors for coronary artery disease include diabetes mellitus, dyslipidemia, male sex and family history.  Hypertension This is a chronic problem. The current episode started more than 1 year ago. The problem is controlled. Pertinent negatives include no chest pain, headaches, neck pain,  palpitations or shortness of breath. Risk factors for coronary artery disease include family history, dyslipidemia, diabetes mellitus, male gender and smoking/tobacco exposure. Past treatments include calcium channel blockers.     Review of Systems  Constitutional: Negative for chills, fatigue, fever and unexpected weight change.  HENT: Negative for dental problem, mouth sores and trouble swallowing.   Eyes: Negative for visual disturbance.  Respiratory: Negative for cough, choking, chest tightness, shortness of breath and wheezing.   Cardiovascular: Negative for chest pain, palpitations and leg swelling.  Gastrointestinal: Negative for abdominal distention, abdominal pain, constipation, diarrhea, nausea and vomiting.  Endocrine: Negative for polydipsia, polyphagia and polyuria.  Genitourinary: Negative for dysuria, flank pain, hematuria and urgency.  Musculoskeletal: Negative for back pain, gait problem, myalgias and neck pain.  Skin: Negative for pallor, rash and wound.  Neurological: Negative for seizures, syncope, weakness, numbness and headaches.  Psychiatric/Behavioral: Negative for confusion and dysphoric mood.     Objective:    BP 122/82   Pulse 87   Ht 6\' 1"  (1.854 m)   Wt 202 lb 3.2 oz (91.7 kg)   BMI 26.68 kg/m   Wt Readings from Last 3 Encounters:  07/20/19 202 lb 3.2 oz (91.7 kg)  04/26/19 204 lb 6.4 oz (92.7 kg)  03/22/19 203 lb 12.8 oz (92.4 kg)     Physical Exam Constitutional:      General: He is not in acute distress.    Appearance: He is well-developed.  HENT:     Head: Normocephalic and atraumatic.  Neck:     Thyroid: No thyromegaly.     Trachea: No tracheal deviation.  Cardiovascular:     Rate and Rhythm: Normal rate.     Pulses:          Dorsalis pedis pulses are 1+ on the right side and 1+ on the left side.       Posterior tibial pulses are 1+ on the right side and 1+ on the left side.     Heart sounds: S1 normal and S2 normal. No murmur heard.  No gallop.   Pulmonary:     Effort: Pulmonary effort is normal. No respiratory distress.     Breath sounds: No wheezing.  Abdominal:     General: There is no distension.     Tenderness: There is no abdominal tenderness. There is no guarding.  Musculoskeletal:     Right shoulder: No swelling or deformity.     Cervical back: Normal range of motion and neck supple.  Skin:    General: Skin is warm and dry.     Findings: No rash.     Nails: There is no clubbing.  Neurological:     Mental Status: He is alert and oriented to person, place, and time.     Cranial Nerves: No cranial nerve deficit.     Sensory: No sensory deficit.     Gait: Gait normal.     Deep Tendon Reflexes: Reflexes are normal and symmetric.  Psychiatric:        Speech: Speech normal.        Behavior: Behavior normal. Behavior is cooperative.        Thought Content: Thought content normal.        Judgment: Judgment normal.      CMP ( most recent) CMP     Component Value Date/Time   NA 138 03/10/2019 0934   NA 139 04/23/2014 1040   NA 138 10/04/2013 2025   K 3.9 03/10/2019 0934  K 3.0 (L) 10/04/2013 2025   CL 102 03/10/2019 0934   CL  104 10/04/2013 2025   CO2 27 03/10/2019 0934   CO2 25 10/04/2013 2025   GLUCOSE 205 (H) 03/10/2019 0934   GLUCOSE 175 (H) 10/04/2013 2025   BUN 9 03/10/2019 0934   BUN 15 04/23/2014 1040   BUN 13 10/04/2013 2025   CREATININE 0.79 03/10/2019 0934   CALCIUM 9.5 03/10/2019 0934   CALCIUM 9.2 10/04/2013 2025   PROT 7.0 03/10/2019 0934   PROT 7.4 10/04/2013 2025   ALBUMIN 4.6 10/18/2018 1406   ALBUMIN 4.1 10/04/2013 2025   AST 15 03/10/2019 0934   AST 8 (L) 10/04/2013 2025   ALT 32 03/10/2019 0934   ALT 19 10/04/2013 2025   ALKPHOS 98 10/18/2018 1406   ALKPHOS 114 10/04/2013 2025   BILITOT 0.6 03/10/2019 0934   BILITOT 0.4 10/04/2013 2025   GFRNONAA 105 03/10/2019 0934   GFRAA 121 03/10/2019 0934     Diabetic Labs (most recent): Lab Results  Component Value Date   HGBA1C 6.4 (A) 07/20/2019   HGBA1C 8.7 (A) 03/22/2019   HGBA1C 7.0 (H) 10/18/2018     Lipid Panel ( most recent) Lipid Panel     Component Value Date/Time   CHOL 240 (H) 03/10/2019 0934   CHOL 180 03/23/2013 0422   TRIG 264 (H) 03/10/2019 0934   TRIG 218 (H) 03/23/2013 0422   HDL 30 (L) 03/10/2019 0934   HDL 21 (L) 03/23/2013 0422   CHOLHDL 8.0 (H) 03/10/2019 0934   VLDL 53.8 (H) 10/18/2018 1406   VLDL 44 (H) 03/23/2013 0422   LDLCALC 167 (H) 03/10/2019 0934   LDLCALC 115 (H) 03/23/2013 0422   LDLDIRECT 176.0 10/18/2018 1406      Lab Results  Component Value Date   TSH 1.38 10/18/2018   TSH 1.07 06/11/2016   TSH 1.66 03/12/2014   TSH 1.140 11/24/2013   TSH 1.42 02/25/2012   FREET4 1.00 04/23/2014      Assessment & Plan:   1. Uncontrolled type 2 diabetes mellitus with hyperglycemia (HCC)  - Jacob Baker has currently uncontrolled symptomatic type 2 DM since  49 years of age. He does not monitor blood glucose regularly, reports lowest blood glucose readings he had was 115, highest 170.  His point-of-care A1c 6.4% improving from 8.7%.    Recent labs reviewed, showing normal renal  function. - I had a long discussion with him about the progressive nature of diabetes and the pathology behind its complications. -his diabetes is complicated by smoking and he remains at a high risk for more acute and chronic complications which include CAD, CVA, CKD, retinopathy, and neuropathy. These are all discussed in detail with him.  - I have counseled him on diet  and weight management  by adopting a carbohydrate restricted/protein rich diet. Patient is encouraged to switch to  unprocessed or minimally processed     complex starch and increased protein intake (animal or plant source), fruits, and vegetables. -  he is advised to stick to a routine mealtimes to eat 3 meals  a day and avoid unnecessary snacks ( to snack only to correct hypoglycemia).   - he  admits there is a room for improvement in his diet and drink choices. -  Suggestion is made for him to avoid simple carbohydrates  from his diet including Cakes, Sweet Desserts / Pastries, Ice Cream, Soda (diet and regular), Sweet Tea, Candies, Chips, Cookies, Sweet Pastries,  Store Bought Juices,  Alcohol in Excess of  1-2 drinks a day, Artificial Sweeteners, Coffee Creamer, and "Sugar-free" Products. This will help patient to have stable blood glucose profile and potentially avoid unintended weight gain.   - he will be scheduled with Jearld Fenton, RDN, CDE for diabetes education.  - I have approached him with the following individualized plan to manage  his diabetes and patient agrees:   -Based on his current glycemic response, he will not need insulin treatment.  -He promises to do better in his lifestyle modifications.  He is advised to continue Metformin 1000 mg ER daily after breakfast.  He did not afford Onglyza, benefiting from low-dose glipizide.  He is advised to continue glipizide 5 mg XL p.o. daily at breakfast.    - Specific targets for  A1c;  LDL, HDL, Triglycerides, and  Waist Circumference were discussed with the  patient.  2) Blood Pressure /Hypertension: -His blood pressure is controlled to target.  He is advised to continue amlodipine 10 mg p.o. daily at breakfast.   3) Lipids/Hyperlipidemia:   Review of his recent repeat lipid panel showed still high LDL of 167.  He has not been consistent taking his Crestor.  He is approached for better consistency with Crestor 10 mg p.o. nightly.      Side effects and precautions discussed with him.  4)  Weight/Diet:  Body mass index is 26.68 kg/m.  -    he is not a candidate for major weight loss. I discussed with him the fact that loss of 5 - 10% of his  current body weight will have the most impact on his diabetes management.  Exercise, and detailed carbohydrates information provided  -  detailed on discharge instructions.  5) Chronic Care/Health Maintenance:  -he  is on  Statin medications and  is encouraged to initiate and continue to follow up with Ophthalmology, Dentist,  Podiatrist at least yearly or according to recommendations, and advised to  quit smoking. -He is extensively counseled for smoking cessation.  -  I have recommended yearly flu vaccine and pneumonia vaccine at least every 5 years; moderate intensity exercise for up to 150 minutes weekly; and  sleep for at least 7 hours a day.  - he is  advised to maintain close follow up with [name], MD for primary care needs, as well as his other providers for optimal and coordinated care.  - Time spent on this patient care encounter:  35 min, of which > 50% was spent in  counseling and the rest reviewing his blood glucose logs , discussing his hypoglycemia and hyperglycemia episodes, reviewing his current and  previous labs / studies  ( including abstraction from other facilities) and medications  doses and developing a  long term treatment plan and documenting his care.   Please refer to Patient Instructions for Blood Glucose Monitoring and Insulin/Medications Dosing Guide"  in media tab for  additional information. Please  also refer to " Patient Self Inventory" in the Media  tab for reviewed elements of pertinent patient history.  Jacob Baker participated in the discussions, expressed understanding, and voiced agreement with the above plans.  All questions were answered to his satisfaction. he is encouraged to contact clinic should he have any questions or concerns prior to his return visit.  Follow up plan: - Return in about 4 months (around 11/19/2019) for F/U with Pre-visit Labs, Meter, Logs, A1c here.Glade Lloyd, MD [facility] [address]  [place], [place] [address] Phone: [phone]  Fax: [phone]    07/20/2019, 6:49 PM  This note was partially dictated with voice recognition software. Similar sounding words can be transcribed inadequately or may not  be corrected upon review.

## 2019-08-07 ENCOUNTER — Institutional Professional Consult (permissible substitution): Payer: BC Managed Care – PPO | Admitting: Pulmonary Disease

## 2019-08-31 ENCOUNTER — Other Ambulatory Visit: Payer: Self-pay | Admitting: Endocrinology

## 2019-09-05 ENCOUNTER — Telehealth: Payer: Self-pay | Admitting: "Endocrinology

## 2019-09-05 MED ORDER — METFORMIN HCL ER (MOD) 1000 MG PO TB24: 1000.0000 mg | ORAL_TABLET | Freq: Every day | ORAL | 0 refills | Status: DC

## 2019-09-05 NOTE — Telephone Encounter (Signed)
Rx refill sent.

## 2019-09-05 NOTE — Telephone Encounter (Signed)
Pt requesting refill on metFORMIN (GLUMETZA) 1000 MG (MOD) 24 hr tablet. [place].

## 2019-10-03 NOTE — Telephone Encounter (Signed)
Returned call to patient and left voicemail that medication was approved by insurance.

## 2019-10-03 NOTE — Telephone Encounter (Signed)
Patient is calling and states they did not let him pick up this RX on 8/10. He is unsure if it needs a prior authorization or what is needing to be done for him to get this. States he has not taken it for a couples days

## 2019-10-04 ENCOUNTER — Other Ambulatory Visit: Payer: Self-pay

## 2019-10-04 DIAGNOSIS — E1165 Type 2 diabetes mellitus with hyperglycemia: Secondary | ICD-10-CM

## 2019-10-04 MED ORDER — METFORMIN HCL 500 MG PO TABS: 500.0000 mg | ORAL_TABLET | Freq: Two times a day (BID) | ORAL | 3 refills | Status: DC

## 2019-10-19 ENCOUNTER — Other Ambulatory Visit: Payer: Self-pay

## 2019-10-20 ENCOUNTER — Ambulatory Visit (INDEPENDENT_AMBULATORY_CARE_PROVIDER_SITE_OTHER): Payer: BC Managed Care – PPO | Admitting: Internal Medicine

## 2019-10-20 ENCOUNTER — Encounter: Payer: Self-pay | Admitting: Internal Medicine

## 2019-10-20 ENCOUNTER — Other Ambulatory Visit: Payer: Self-pay

## 2019-10-20 VITALS — BP 124/88 | HR 81 | Temp 98.4°F | Ht 72.99 in | Wt 200.2 lb

## 2019-10-20 DIAGNOSIS — E1121 Type 2 diabetes mellitus with diabetic nephropathy: Secondary | ICD-10-CM

## 2019-10-20 DIAGNOSIS — Z125 Encounter for screening for malignant neoplasm of prostate: Secondary | ICD-10-CM

## 2019-10-20 DIAGNOSIS — Z0000 Encounter for general adult medical examination without abnormal findings: Secondary | ICD-10-CM | POA: Diagnosis not present

## 2019-10-20 DIAGNOSIS — Z23 Encounter for immunization: Secondary | ICD-10-CM | POA: Diagnosis not present

## 2019-10-20 DIAGNOSIS — E1165 Type 2 diabetes mellitus with hyperglycemia: Secondary | ICD-10-CM

## 2019-10-20 DIAGNOSIS — R194 Change in bowel habit: Secondary | ICD-10-CM | POA: Diagnosis not present

## 2019-10-20 DIAGNOSIS — K5904 Chronic idiopathic constipation: Secondary | ICD-10-CM

## 2019-10-20 DIAGNOSIS — E782 Mixed hyperlipidemia: Secondary | ICD-10-CM

## 2019-10-20 MED ORDER — LACTULOSE 10 GM/15ML PO SOLN: 30.0000 g | Freq: Every day | ORAL | 0 refills | Status: DC | PRN

## 2019-10-20 NOTE — Patient Instructions (Signed)
Let's try the Lactulose again for the constipation.  Every other day  You can also double the metformin dose and suspend the glipizide,  To see if it helps the bowels move more regularly    Jacob Baker Maintenance, Male Adopting a healthy lifestyle and getting preventive care are important in promoting health and wellness. Ask your health care provider about:  The right schedule for you to have regular tests and exams.  Things you can do on your own to prevent diseases and keep yourself healthy. What should I know about diet, weight, and exercise? Eat a healthy diet   Eat a diet that includes plenty of vegetables, fruits, low-fat dairy products, and lean protein.  Do not eat a lot of foods that are high in solid fats, added sugars, or sodium. Maintain a healthy weight Body mass index (BMI) is a measurement that can be used to identify possible weight problems. It estimates body fat based on height and weight. Your health care provider can help determine your BMI and help you achieve or maintain a healthy weight. Get regular exercise Get regular exercise. This is one of the most important things you can do for your health. Most adults should:  Exercise for at least 150 minutes each week. The exercise should increase your heart rate and make you sweat (moderate-intensity exercise).  Do strengthening exercises at least twice a week. This is in addition to the moderate-intensity exercise.  Spend less time sitting. Even light physical activity can be beneficial. Watch cholesterol and blood lipids Have your blood tested for lipids and cholesterol at 51 years of age, then have this test every 5 years. You may need to have your cholesterol levels checked more often if:  Your lipid or cholesterol levels are high.  You are older than 51 years of age.  You are at high risk for heart disease. What should I know about cancer screening? Many types of cancers  can be detected early and may often be prevented. Depending on your health history and family history, you may need to have cancer screening at various ages. This may include screening for:  Colorectal cancer.  Prostate cancer.  Skin cancer.  Lung cancer. What should I know about heart disease, diabetes, and high blood pressure? Blood pressure and heart disease  High blood pressure causes heart disease and increases the risk of stroke. This is more likely to develop in people who have high blood pressure readings, are of African descent, or are overweight.  Talk with your health care provider about your target blood pressure readings.  Have your blood pressure checked: ? Every 3-5 years if you are 61-66 years of age. ? Every year if you are 20 years old or older.  If you are between the ages of 81 and 56 and are a current or former smoker, ask your health care provider if you should have a one-time screening for abdominal aortic aneurysm (AAA). Diabetes Have regular diabetes screenings. This checks your fasting blood sugar level. Have the screening done:  Once every three years after age 62 if you are at a normal weight and have a low risk for diabetes.  More often and at a younger age if you are overweight or have a high risk for diabetes. What should I know about preventing infection? Hepatitis B If you have a higher risk for hepatitis B, you should be screened for this virus. Talk with your health care provider  to find out if you are at risk for hepatitis B infection. Hepatitis C Blood testing is recommended for:  Everyone born from [birth year] through 1967.  Anyone with known risk factors for hepatitis C. Sexually transmitted infections (STIs)  You should be screened each year for STIs, including gonorrhea and chlamydia, if: ? You are sexually active and are younger than 51 years of age. ? You are older than 51 years of age and your health care provider tells you that you are at  risk for this type of infection. ? Your sexual activity has changed since you were last screened, and you are at increased risk for chlamydia or gonorrhea. Ask your health care provider if you are at risk.  Ask your health care provider about whether you are at high risk for HIV. Your health care provider may recommend a prescription medicine to help prevent HIV infection. If you choose to take medicine to prevent HIV, you should first get tested for HIV. You should then be tested every 3 months for as long as you are taking the medicine. Follow these instructions at home: Lifestyle  Do not use any products that contain nicotine or tobacco, such as cigarettes, e-cigarettes, and chewing tobacco. If you need help quitting, ask your health care provider.  Do not use street drugs.  Do not share needles.  Ask your health care provider for help if you need support or information about quitting drugs. Alcohol use  Do not drink alcohol if your health care provider tells you not to drink.  If you drink alcohol: ? Limit how much you have to 0-2 drinks a day. ? Be aware of how much alcohol is in your drink. In the [place] one drink equals one 12 oz bottle of beer (355 mL), one 5 oz glass of wine (148 mL), or one 1 oz glass of hard liquor (44 mL). General instructions  Schedule regular health, dental, and eye exams.  Stay current with your vaccines.  Tell your health care provider if: ? You often feel depressed. ? You have ever been abused or do not feel safe at home. Summary  Adopting a healthy lifestyle and getting preventive care are important in promoting health and wellness.  Follow your health care provider's instructions about healthy diet, exercising, and getting tested or screened for diseases.  Follow your health care provider's instructions on monitoring your cholesterol and blood pressure. This information is not intended to replace advice given to you by your health care  provider. Make sure you discuss any questions you have with your health care provider. Document Revised: 01/05/2018 Document Reviewed: 01/05/2018 Elsevier Patient Education  2020 [employer].

## 2019-10-20 NOTE — Progress Notes (Signed)
Patient ID: Jacob Baker, male    DOB: 07/30/1970  Age: 49 y.o. MRN: [id number]  The patient is here for annual wellness examination and management of other chronic and acute problems.   The risk factors are reflected in the social history.  The roster of all physicians providing medical care to patient - is listed in the Snapshot section of the chart.  Activities of daily living:  The patient is 100% independent in all ADLs: dressing, toileting, feeding as well as independent mobility  Home safety : The patient has smoke detectors in the home. They wear seatbelts.  There are no firearms at home. There is no violence in the home.   There is no risks for hepatitis, STDs or HIV. There is no   history of blood transfusion. They have no travel history to infectious disease endemic areas of the world.  The patient has seen their dentist in the last six month. They have seen their eye doctor in the last year. They admit to slight hearing difficulty with regard to whispered voices and some television programs.  They have deferred audiologic testing in the last year.  They do not  have excessive sun exposure. Discussed the need for sun protection: hats, long sleeves and use of sunscreen if there is significant sun exposure.   Diet: the importance of a healthy diet is discussed. They do have a healthy diet.  The benefits of regular aerobic exercise were discussed. He does not exercise regularly  Depression screen: there are no signs or vegative symptoms of depression- irritability, change in appetite, anhedonia, sadness/tearfullness.  Cognitive assessment: the patient manages all their financial and personal affairs and is actively engaged. They could relate day,date,year and events; recalled 2/3 objects at 3 minutes; performed clock-face test normally.  The following portions of the patient's history were reviewed and updated as appropriate: allergies, current medications, past family history, past  medical history,  past surgical history, past social history  and problem list.  Visual acuity was not assessed per patient preference since she has regular follow up with her ophthalmologist. Hearing and body mass index were assessed and reviewed.   During the course of the visit the patient was educated and counseled about appropriate screening and preventive services including : fall prevention , diabetes screening, nutrition counseling, colorectal cancer screening, and recommended immunizations.    CC: The primary encounter diagnosis was Prostate cancer screening. Diagnoses of Diabetes mellitus with microalbuminuric diabetic nephropathy ([place]), Need for immunization against influenza, Change in bowel habits, Uncontrolled type 2 diabetes mellitus with hyperglycemia ([place]), Mixed hyperlipidemia, Chronic idiopathic constipation, and Encounter for preventive health examination were also pertinent to this visit.   1) DM: managed by Endicrinology with metformin.  Due for a1c   2)  Constipation despite using sennakot once daily in the am   Unable lst year to get linzess or amitiza.  Repeat rx for lactulsoe,  Talk to Catie   History Jacob Baker has a past medical history of CHF (congestive heart failure) ([place]), Diabetes mellitus without complication ([place]), DVT (deep venous thrombosis) ([place]) (04/2017), GERD (gastroesophageal reflux disease), History of migraine, Hyperlipidemia, Hypertension, and Syncope and collapse.   He has a past surgical history that includes Finger amputation (2009); Colonoscopy; Esophagogastroduodenoscopy (egd) with propofol (N/A, 04/08/2015); Esophagogastroduodenoscopy (egd) with propofol (N/A, 09/24/2016); Esophageal dilation (09/24/2016); Colonoscopy with propofol (N/A, 08/06/2017); and Polypectomy (08/06/2017).   His family history includes Diabetes in his father; Heart disease in his father; Hypertension in his brother, brother, father,  mother, and sister.He reports that he has been  smoking cigarettes. He has a 54.00 pack-year smoking history. He has never used smokeless tobacco. He reports that he does not drink alcohol and does not use drugs.  Outpatient Medications Prior to Visit  Medication Sig Dispense Refill  . albuterol (VENTOLIN HFA) 108 (90 Base) MCG/ACT inhaler Inhale 2 puffs into the lungs every 6 (six) hours as needed for wheezing or shortness of breath. 8 g 1  . amLODipine (NORVASC) 10 MG tablet Take 1 tablet (10 mg total) by mouth daily. 90 tablet 1  . cyanocobalamin 1000 MCG tablet Take 100 mcg by mouth daily.    Marland Kitchen glipiZIDE (GLUCOTROL XL) 5 MG 24 hr tablet Take 1 tablet (5 mg total) by mouth daily with breakfast. 90 tablet 1  . glucose blood (ONETOUCH VERIO) test strip Test sugars twice daily 50 each 6  . metFORMIN (GLUCOPHAGE) 500 MG tablet Take 1 tablet (500 mg total) by mouth 2 (two) times daily with a meal. 180 tablet 3  . ONETOUCH DELICA LANCETS 58N MISC Test sugars twice daily 50 each 3  . pantoprazole (PROTONIX) 40 MG tablet TAKE 1 TABLET(40 MG) BY MOUTH DAILY 90 tablet 3  . Plecanatide (TRULANCE) 3 MG TABS Take 3 mg by mouth daily. 30 tablet 5  . rosuvastatin (CRESTOR) 20 MG tablet Take 1 tablet (20 mg total) by mouth at bedtime. 90 tablet 1   No facility-administered medications prior to visit.    Review of Systems  Objective:  BP 124/88 (BP Location: Left Arm, Patient Position: Sitting)   Pulse 81   Temp 98.4 F (36.9 C)   Ht 6' 0.99" (1.854 m)   Wt 200 lb 3.2 oz (90.8 kg)   SpO2 98%   BMI 26.42 kg/m   Physical Exam    Assessment & Plan:   Problem List Items Addressed This Visit      Unprioritized   Diabetes mellitus with microalbuminuric diabetic nephropathy (HCC)   Relevant Medications   rosuvastatin (CRESTOR) 40 MG tablet   Other Relevant Orders   Hemoglobin A1c (Completed)   Comprehensive metabolic panel (Completed)   Lipid panel (Completed)   Microalbumin / creatinine urine ratio (Completed)   Change in bowel habits     Chronic constipation despite use of laxatives .  Previous attempts to obtain linzess were unsuccessful due to noninsurance      Relevant Orders   Ambulatory referral to Chronic Care Management Services   Chronic idiopathic constipation    He has tried all otc products without success except for stimulant laxatives. colonoscopy  For same was unrevealing in 2019. Repeat appeal for Linzess forthcoming      Relevant Medications   lactulose ([place]) 10 GM/15ML solution   Encounter for preventive health examination    age appropriate education and counseling updated, referrals for preventative services and immunizations addressed, dietary and smoking counseling addressed, most recent labs reviewed.  I have personally reviewed and have noted:  1) the patient's medical and social history 2) The pt's use of alcohol, tobacco, and illicit drugs 3) The patient's current medications and supplements 4) Functional ability including ADL's, fall risk, home safety risk, hearing and visual impairment 5) Diet and physical activities 6) Evidence for depression or mood disorder 7) The patient's height, weight, and BMI have been recorded in the chart  I have made referrals, and provided counseling and education based on review of the above      Mixed hyperlipidemia    Managed  with rosuvastatin  20 mg since September for elevated 10 yr risk . LDL is still not at goal.  Will recommend increase to 40 mg daily     Lab Results  Component Value Date   CHOL 263 (H) 10/20/2019   HDL 27 (L) 10/20/2019   LDLCALC 194 (H) 10/20/2019   LDLDIRECT 176.0 10/18/2018   TRIG 229 (H) 10/20/2019   CHOLHDL 9.7 (H) 10/20/2019         Relevant Medications   rosuvastatin (CRESTOR) 40 MG tablet   Uncontrolled type 2 diabetes mellitus with hyperglycemia ([place])    Patient was referred to endocrinologist in September 2020  for uncontrolled T2DM and is seeing Dr Dorris Fetch in [place]. A1c has risen to 8.7 . He could not  afford onglyza and has been taking metformin and glipizide .  Given his drop in a1c  Warned of hypoglycemic events.   Lab Results  Component Value Date   HGBA1C 5.9 (H) 10/20/2019   Lab Results  Component Value Date   MICROALBUR 2.6 10/20/2019   MICROALBUR 1.8 03/10/2019           Relevant Medications   rosuvastatin (CRESTOR) 40 MG tablet    Other Visit Diagnoses    Prostate cancer screening    -  Primary   Relevant Orders   PSA (Completed)   Need for immunization against influenza       Relevant Orders   Flu Vaccine QUAD 36+ mos IM (Completed)      I have changed Jacob Baker's rosuvastatin. I am also having him start on lactulose. Additionally, I am having him maintain his OneTouch Delica Lancets 44I, glucose blood, cyanocobalamin, pantoprazole, Trulance, glipiZIDE, albuterol, amLODipine, and metFORMIN.  Meds ordered this encounter  Medications  . lactulose (CHRONULAC) 10 GM/15ML solution    Sig: Take 45 mLs (30 g total) by mouth daily as needed for mild constipation.    Dispense:  236 mL    Refill:  0  . rosuvastatin (CRESTOR) 40 MG tablet    Sig: Take 1 tablet (40 mg total) by mouth at bedtime.    Dispense:  90 tablet    Refill:  1    Medications Discontinued During This Encounter  Medication Reason  . rosuvastatin (CRESTOR) 20 MG tablet     Follow-up: No follow-ups on file.   [name], MD

## 2019-10-21 LAB — LIPID PANEL
Cholesterol: 263 mg/dL — ABNORMAL HIGH (ref <200)
HDL: 27 mg/dL — ABNORMAL LOW (ref >=40)
LDL Cholesterol (Calc): 194 mg/dL (calc) — ABNORMAL HIGH
Non-HDL Cholesterol (Calc): 236 mg/dL (calc) — ABNORMAL HIGH (ref <130)
Total CHOL/HDL Ratio: 9.7 (calc) — ABNORMAL HIGH (ref <5.0)
Triglycerides: 229 mg/dL — ABNORMAL HIGH (ref <150)

## 2019-10-21 LAB — COMPREHENSIVE METABOLIC PANEL
AG Ratio: 2.3 (calc) (ref 1.0–2.5)
ALT: 18 U/L (ref 9–46)
AST: 12 U/L (ref 10–35)
Albumin: 4.8 g/dL (ref 3.6–5.1)
Alkaline phosphatase (APISO): 96 U/L (ref 35–144)
BUN: 13 mg/dL (ref 7–25)
CO2: 27 mmol/L (ref 20–32)
Calcium: 9.8 mg/dL (ref 8.6–10.3)
Chloride: 105 mmol/L (ref 98–110)
Creat: 1.04 mg/dL (ref 0.70–1.33)
Globulin: 2.1 g/dL (calc) (ref 1.9–3.7)
Glucose, Bld: 87 mg/dL (ref 65–99)
Potassium: 3.6 mmol/L (ref 3.5–5.3)
Sodium: 141 mmol/L (ref 135–146)
Total Bilirubin: 0.8 mg/dL (ref 0.2–1.2)
Total Protein: 6.9 g/dL (ref 6.1–8.1)

## 2019-10-21 LAB — MICROALBUMIN / CREATININE URINE RATIO
Creatinine, Urine: 214 mg/dL (ref 20–320)
Microalb Creat Ratio: 12 mcg/mg creat (ref <30)
Microalb, Ur: 2.6 mg/dL

## 2019-10-21 LAB — HEMOGLOBIN A1C
Hgb A1c MFr Bld: 5.9 % of total Hgb — ABNORMAL HIGH (ref <5.7)
Mean Plasma Glucose: 123 (calc)
eAG (mmol/L): 6.8 (calc)

## 2019-10-21 LAB — PSA: PSA: 0.67 ng/mL (ref <=4.0)

## 2019-10-22 MED ORDER — ROSUVASTATIN CALCIUM 40 MG PO TABS
40.0000 mg | ORAL_TABLET | Freq: Every day | ORAL | 1 refills | Status: DC
Start: 2019-10-22 — End: 2021-01-01

## 2019-10-22 NOTE — Assessment & Plan Note (Signed)
He has tried all otc products without success except for stimulant laxatives. colonoscopy  For same was unrevealing in 2019. Repeat appeal for Linzess forthcoming

## 2019-10-22 NOTE — Progress Notes (Signed)
Your diabetes is under excellent control currently. You have lowered your a1c  from 6.4. to 6.9  However, your cholesterol is too high.  Please let me know how often you are taking rosuvastatin

## 2019-10-22 NOTE — Assessment & Plan Note (Signed)

## 2019-10-22 NOTE — Assessment & Plan Note (Addendum)
Patient was referred to endocrinologist in September 2020  for uncontrolled T2DM and is seeing Dr Dorris Fetch in [place]. A1c has risen to 8.7 . He could not afford onglyza and has been taking metformin and glipizide .  Given his drop in a1c  Warned of hypoglycemic events.   Lab Results  Component Value Date   HGBA1C 5.9 (H) 10/20/2019   Lab Results  Component Value Date   MICROALBUR 2.6 10/20/2019   MICROALBUR 1.8 03/10/2019

## 2019-10-22 NOTE — Assessment & Plan Note (Signed)
Managed with rosuvastatin  20 mg since September for elevated 10 yr risk . LDL is still not at goal.  Will recommend increase to 40 mg daily     Lab Results  Component Value Date   CHOL 263 (H) 10/20/2019   HDL 27 (L) 10/20/2019   LDLCALC 194 (H) 10/20/2019   LDLDIRECT 176.0 10/18/2018   TRIG 229 (H) 10/20/2019   CHOLHDL 9.7 (H) 10/20/2019

## 2019-10-22 NOTE — Assessment & Plan Note (Signed)
Chronic constipation despite use of laxatives .  Previous attempts to obtain linzess were unsuccessful due to noninsurance

## 2019-10-23 ENCOUNTER — Telehealth: Payer: Self-pay

## 2019-10-23 NOTE — Chronic Care Management (AMB) (Signed)
  Care Management   Outreach Note  10/23/2019 Name: Jacob Baker MRN: [id number] DOB: 03/28/1970  Referred by: [name], MD Reason for referral : Care Coordination (Outreach to Schedule referral for pharm D )   An unsuccessful telephone outreach was attempted today. The patient was referred to the case management team for assistance with care management and care coordination.   Follow Up Plan: A HIPAA compliant phone message was left for the patient providing contact information and requesting a return call.  The care management team will reach out to the patient again over the next 7 days.  If patient returns call to provider office, please advise to call [facility] at [address], [place], [place], [place] [address] Direct Dial: [phone] [email or web address]: [email or web address]

## 2019-10-26 NOTE — Chronic Care Management (AMB) (Signed)
  Care Management   Outreach Note  10/26/2019 Name: Jacob Baker MRN: [id number] DOB: 1970-08-11  Referred by: [name], MD Reason for referral : Care Coordination (Outreach to Schedule referral for pharm D ) and Care Coordination (Second Outreach to Schedule referral for pharm D )   A second unsuccessful telephone outreach was attempted today. The patient was referred to the case management team for assistance with care management and care coordination.   Follow Up Plan: A HIPAA compliant phone message was left for the patient providing contact information and requesting a return call.  The care management team will reach out to the patient again over the next 7 days.  If patient returns call to provider office, please advise to call [facility]  at [address], [place], [place], [place] [address] Direct Dial: [phone] [email or web address]: [email or web address]

## 2019-10-31 NOTE — Chronic Care Management (AMB) (Signed)
  Care Management   Outreach Note  10/31/2019 Name: Jacob Baker MRN: [id number] DOB: 04/15/1970  Referred by: [name], MD Reason for referral : Care Coordination (Outreach to Schedule referral for pharm D ), Care Coordination (Second Outreach to Schedule referral for pharm D ), and Care Coordination ( Third Outreach to Schedule referral for pharm D )   Third unsuccessful telephone outreach was attempted today. The patient was referred to the case management team for assistance with care management and care coordination. The patient's primary care provider has been notified of our unsuccessful attempts to make or maintain contact with the patient. The care management team is pleased to engage with this patient at any time in the future should he/she be interested in assistance from the care management team.   Follow Up Plan: A HIPAA compliant phone message was left for the patient providing contact information and requesting a return call.  The care management team will reach out to the patient again over the next 7 days.  If patient returns call to provider office, please advise to call [facility] at [address], [place], [place], [place] [address] Direct Dial: [phone] [email or web address]: [email or web address]

## 2019-11-03 NOTE — Chronic Care Management (AMB) (Signed)
  Care Management   Outreach Note  11/03/2019 Name: Jacob Baker MRN: [id number] DOB: [birth year]  Referred by: [name], MD Reason for referral : Care Coordination (Outreach to Schedule referral for pharm D ), Care Coordination (Second Outreach to Schedule referral for pharm D ), Care Coordination ( Third Outreach to Schedule referral for pharm D ), and Care Coordination (fourth  Outreach to Schedule referral for pharm D )   Third unsuccessful telephone outreach was attempted today. The patient was referred to the case management team for assistance with care management and care coordination. The patient's primary care provider has been notified of our unsuccessful attempts to make or maintain contact with the patient. The care management team is pleased to engage with this patient at any time in the future should he/she be interested in assistance from the care management team.   Follow Up Plan: A HIPAA compliant phone message was left for the patient providing contact information and requesting a return call.  We have been unable to make contact with the patient for follow up. The care management team is available to follow up with the patient after provider conversation with the patient regarding recommendation for care management engagement and subsequent re-referral to the care management team.   Noreene Larsson, [address], [place], [place] [address] Direct Dial: [phone] [email or web address]: [email or web address]

## 2019-11-09 ENCOUNTER — Other Ambulatory Visit: Payer: Self-pay | Admitting: "Endocrinology

## 2019-11-14 ENCOUNTER — Other Ambulatory Visit: Payer: Self-pay | Admitting: "Endocrinology

## 2019-11-16 DIAGNOSIS — E782 Mixed hyperlipidemia: Secondary | ICD-10-CM | POA: Diagnosis not present

## 2019-11-16 DIAGNOSIS — E1165 Type 2 diabetes mellitus with hyperglycemia: Secondary | ICD-10-CM | POA: Diagnosis not present

## 2019-11-17 LAB — COMPLETE METABOLIC PANEL WITH GFR
AG Ratio: 2.1 (calc) (ref 1.0–2.5)
ALT: 13 U/L (ref 9–46)
AST: 10 U/L (ref 10–35)
Albumin: 4.6 g/dL (ref 3.6–5.1)
Alkaline phosphatase (APISO): 96 U/L (ref 35–144)
BUN: 10 mg/dL (ref 7–25)
CO2: 24 mmol/L (ref 20–32)
Calcium: 9.6 mg/dL (ref 8.6–10.3)
Chloride: 107 mmol/L (ref 98–110)
Creat: 1.04 mg/dL (ref 0.70–1.33)
GFR, Est African American: 96 mL/min/{1.73_m2} (ref >=60)
GFR, Est Non African American: 83 mL/min/{1.73_m2} (ref >=60)
Globulin: 2.2 g/dL (calc) (ref 1.9–3.7)
Glucose, Bld: 113 mg/dL — ABNORMAL HIGH (ref 65–99)
Potassium: 3.6 mmol/L (ref 3.5–5.3)
Sodium: 141 mmol/L (ref 135–146)
Total Bilirubin: 0.6 mg/dL (ref 0.2–1.2)
Total Protein: 6.8 g/dL (ref 6.1–8.1)

## 2019-11-17 LAB — LIPID PANEL
Cholesterol: 186 mg/dL (ref <200)
HDL: 30 mg/dL — ABNORMAL LOW (ref >=40)
LDL Cholesterol (Calc): 127 mg/dL (calc) — ABNORMAL HIGH
Non-HDL Cholesterol (Calc): 156 mg/dL (calc) — ABNORMAL HIGH (ref <130)
Total CHOL/HDL Ratio: 6.2 (calc) — ABNORMAL HIGH (ref <5.0)
Triglycerides: 175 mg/dL — ABNORMAL HIGH (ref <150)

## 2019-11-17 LAB — T4, FREE: Free T4: 1.2 ng/dL (ref 0.8–1.8)

## 2019-11-17 LAB — TSH: TSH: 1.32 mIU/L (ref 0.40–4.50)

## 2019-11-21 ENCOUNTER — Other Ambulatory Visit: Payer: Self-pay

## 2019-11-21 ENCOUNTER — Ambulatory Visit (INDEPENDENT_AMBULATORY_CARE_PROVIDER_SITE_OTHER): Payer: BC Managed Care – PPO | Admitting: "Endocrinology

## 2019-11-21 ENCOUNTER — Encounter: Payer: Self-pay | Admitting: "Endocrinology

## 2019-11-21 VITALS — BP 128/85 | HR 68 | Ht 73.0 in | Wt 201.4 lb

## 2019-11-21 DIAGNOSIS — E1165 Type 2 diabetes mellitus with hyperglycemia: Secondary | ICD-10-CM

## 2019-11-21 DIAGNOSIS — E782 Mixed hyperlipidemia: Secondary | ICD-10-CM | POA: Diagnosis not present

## 2019-11-21 NOTE — Progress Notes (Signed)
11/21/2019, 5:42 PM   Endocrinology follow-up note  Subjective:    Patient ID: Jacob Baker, male    DOB: 15-Jul-1970.  [name] is being seen in follow-up after he was seen in consultation for management of currently uncontrolled symptomatic diabetes requested by  [name], MD.   Past Medical History:  Diagnosis Date  . CHF (congestive heart failure) ([place])   . Diabetes mellitus without complication ([place])   . DVT (deep venous thrombosis) ([place]) 04/2017  . GERD (gastroesophageal reflux disease)   . History of migraine    none in over 2 yrs  . Hyperlipidemia   . Hypertension   . Syncope and collapse    x1 - approx 2-3 yrs ago - dehydration    Past Surgical History:  Procedure Laterality Date  . COLONOSCOPY    . COLONOSCOPY WITH PROPOFOL N/A 08/06/2017   Procedure: COLONOSCOPY WITH PROPOFOL;  Surgeon: Lucilla Lame, MD;  Location: [facility];  Service: Endoscopy;  Laterality: N/A;  Diabetic - oral meds  . ESOPHAGEAL DILATION  09/24/2016   Procedure: ESOPHAGEAL DILATION;  Surgeon: Lucilla Lame, MD;  Location: [facility];  Service: Gastroenterology;;  . ESOPHAGOGASTRODUODENOSCOPY (EGD) WITH PROPOFOL N/A 04/08/2015   Procedure: ESOPHAGOGASTRODUODENOSCOPY (EGD) WITH PROPOFOL with dialation;  Surgeon: Lucilla Lame, MD;  Location: [facility];  Service: Endoscopy;  Laterality: N/A;  diabetic - oral meds  . ESOPHAGOGASTRODUODENOSCOPY (EGD) WITH PROPOFOL N/A 09/24/2016   Procedure: ESOPHAGOGASTRODUODENOSCOPY (EGD) WITH PROPOFOL WITH DILATION;  Surgeon: Lucilla Lame, MD;  Location: [facility];  Service: Gastroenterology;  Laterality: N/A;  Diabetic - oral meds  . FINGER AMPUTATION  2009   partial removal left index finger   . POLYPECTOMY  08/06/2017   Procedure: POLYPECTOMY INTESTINAL;  Surgeon: Lucilla Lame, MD;  Location: [facility];  Service: Endoscopy;;     Social History   Socioeconomic History  . Marital status: Married    Spouse name: Not on file  . Number of children: Not on file  . Years of education: Not on file  . Highest education level: Not on file  Occupational History  . Not on file  Tobacco Use  . Smoking status: Current Every Day Smoker    Packs/day: 2.00    Years: 27.00    Pack years: 54.00    Types: Cigarettes  . Smokeless tobacco: Never Used  Vaping Use  . Vaping Use: Never used  Substance and Sexual Activity  . Alcohol use: No  . Drug use: No  . Sexual activity: Not on file  Other Topics Concern  . Not on file  Social History Narrative  . Not on file   Social Determinants of Health   Financial Resource Strain:   . Difficulty of Paying Living Expenses: Not on file  Food Insecurity:   . Worried About Charity fundraiser in the Last Year: Not on file  . Ran Out of Food in the Last Year: Not on file  Transportation Needs:   . Lack of Transportation (Medical): Not on file  . Lack of Transportation (Non-Medical): Not on file  Physical Activity:   . Days of  Exercise per Week: Not on file  . Minutes of Exercise per Session: Not on file  Stress:   . Feeling of Stress : Not on file  Social Connections:   . Frequency of Communication with Friends and Family: Not on file  . Frequency of Social Gatherings with Friends and Family: Not on file  . Attends Religious Services: Not on file  . Active Member of Clubs or Organizations: Not on file  . Attends Archivist Meetings: Not on file  . Marital Status: Not on file    Family History  Problem Relation Age of Onset  . Heart disease Father        CAD  . Hypertension Father   . Diabetes Father   . Hypertension Sister   . Hypertension Brother   . Hypertension Brother   . Hypertension Mother     Outpatient Encounter Medications as of 11/21/2019  Medication Sig  . albuterol (VENTOLIN HFA) 108 (90 Base) MCG/ACT inhaler Inhale 2 puffs into the  lungs every 6 (six) hours as needed for wheezing or shortness of breath.  Marland Kitchen amLODipine (NORVASC) 10 MG tablet Take 1 tablet (10 mg total) by mouth daily.  . cyanocobalamin 1000 MCG tablet Take 100 mcg by mouth daily.  Marland Kitchen glucose blood (ONETOUCH VERIO) test strip Test sugars twice daily  . lactulose (CHRONULAC) 10 GM/15ML solution Take 45 mLs (30 g total) by mouth daily as needed for mild constipation.  . metFORMIN (GLUCOPHAGE) 500 MG tablet Take 1 tablet (500 mg total) by mouth 2 (two) times daily with a meal.  . ONETOUCH DELICA LANCETS 87F MISC Test sugars twice daily  . pantoprazole (PROTONIX) 40 MG tablet TAKE 1 TABLET(40 MG) BY MOUTH DAILY  . Plecanatide (TRULANCE) 3 MG TABS Take 3 mg by mouth daily.  . rosuvastatin (CRESTOR) 40 MG tablet Take 1 tablet (40 mg total) by mouth at bedtime.  . [DISCONTINUED] glipiZIDE (GLUCOTROL XL) 5 MG 24 hr tablet TAKE 1 TABLET(5 MG) BY MOUTH DAILY WITH BREAKFAST   No facility-administered encounter medications on file as of 11/21/2019.    ALLERGIES: No Known Allergies  VACCINATION STATUS: Immunization History  Administered Date(s) Administered  . Influenza Split 12/15/2013  . Influenza,inj,Quad PF,6+ Mos 10/26/2014, 10/14/2015, 10/13/2017, 10/18/2018, 10/20/2019  . Influenza-Unspecified 09/26/2016  . Moderna SARS-COVID-2 Vaccination 04/28/2019, 05/30/2019  . Pneumococcal Polysaccharide-23 10/14/2015  . Tdap 10/26/2014    Diabetes He presents for his follow-up diabetic visit. He has type 2 diabetes mellitus. His disease course has been improving. There are no hypoglycemic associated symptoms. Pertinent negatives for hypoglycemia include no confusion, headaches, pallor or seizures. There are no diabetic associated symptoms. Pertinent negatives for diabetes include no chest pain, no fatigue, no polydipsia, no polyphagia, no polyuria and no weakness. Symptoms are improving. There are no diabetic complications. Risk factors for coronary artery disease  include dyslipidemia, diabetes mellitus, male sex, family history and tobacco exposure. Current diabetic treatment includes oral agent (dual therapy). His weight is fluctuating minimally. He is following a generally unhealthy diet. When asked about meal planning, he reported none. He has not had a previous visit with a dietitian. He rarely participates in exercise. His home blood glucose trend is decreasing steadily. (He does not monitor blood glucose regularly.  His point-of-care A1c is 6.4% improving from 8.7%.   )  Hyperlipidemia This is a chronic problem. The current episode started more than 1 year ago. The problem is uncontrolled. Exacerbating diseases include diabetes. Pertinent negatives include no chest pain, myalgias  or shortness of breath. Current antihyperlipidemic treatment includes statins. Risk factors for coronary artery disease include diabetes mellitus, dyslipidemia, male sex and family history.  Hypertension This is a chronic problem. The current episode started more than 1 year ago. The problem is controlled. Pertinent negatives include no chest pain, headaches, neck pain, palpitations or shortness of breath. Risk factors for coronary artery disease include family history, dyslipidemia, diabetes mellitus, male gender and smoking/tobacco exposure. Past treatments include calcium channel blockers.     Review of Systems  Constitutional: Negative for chills, fatigue, fever and unexpected weight change.  HENT: Negative for dental problem, mouth sores and trouble swallowing.   Eyes: Negative for visual disturbance.  Respiratory: Negative for cough, choking, chest tightness, shortness of breath and wheezing.   Cardiovascular: Negative for chest pain, palpitations and leg swelling.  Gastrointestinal: Negative for abdominal distention, abdominal pain, constipation, diarrhea, nausea and vomiting.  Endocrine: Negative for polydipsia, polyphagia and polyuria.  Genitourinary: Negative for  dysuria, flank pain, hematuria and urgency.  Musculoskeletal: Negative for back pain, gait problem, myalgias and neck pain.  Skin: Negative for pallor, rash and wound.  Neurological: Negative for seizures, syncope, weakness, numbness and headaches.  Psychiatric/Behavioral: Negative for confusion and dysphoric mood.    Objective:    BP 128/85   Pulse 68   Ht 6\' 1"  (1.854 m)   Wt 201 lb 6.4 oz (91.4 kg)   BMI 26.57 kg/m   Wt Readings from Last 3 Encounters:  11/21/19 201 lb 6.4 oz (91.4 kg)  10/20/19 200 lb 3.2 oz (90.8 kg)  07/20/19 202 lb 3.2 oz (91.7 kg)     Physical Exam Constitutional:      General: He is not in acute distress.    Appearance: He is well-developed.  HENT:     Head: Normocephalic and atraumatic.  Neck:     Thyroid: No thyromegaly.     Trachea: No tracheal deviation.  Cardiovascular:     Rate and Rhythm: Normal rate.     Pulses:          Dorsalis pedis pulses are 1+ on the right side and 1+ on the left side.       Posterior tibial pulses are 1+ on the right side and 1+ on the left side.     Heart sounds: S1 normal and S2 normal. No murmur heard.  No gallop.   Pulmonary:     Effort: Pulmonary effort is normal. No respiratory distress.     Breath sounds: No wheezing.  Abdominal:     General: There is no distension.     Tenderness: There is no abdominal tenderness. There is no guarding.  Musculoskeletal:     Right shoulder: No swelling or deformity.     Cervical back: Normal range of motion and neck supple.  Skin:    General: Skin is warm and dry.     Findings: No rash.     Nails: There is no clubbing.  Neurological:     Mental Status: He is alert and oriented to person, place, and time.     Cranial Nerves: No cranial nerve deficit.     Sensory: No sensory deficit.     Gait: Gait normal.     Deep Tendon Reflexes: Reflexes are normal and symmetric.  Psychiatric:        Speech: Speech normal.        Behavior: Behavior normal. Behavior is  cooperative.        Thought Content: Thought content  normal.        Judgment: Judgment normal.      CMP ( most recent) CMP     Component Value Date/Time   NA 141 11/16/2019 1505   NA 139 04/23/2014 1040   NA 138 10/04/2013 2025   K 3.6 11/16/2019 1505   K 3.0 (L) 10/04/2013 2025   CL 107 11/16/2019 1505   CL 104 10/04/2013 2025   CO2 24 11/16/2019 1505   CO2 25 10/04/2013 2025   GLUCOSE 113 (H) 11/16/2019 1505   GLUCOSE 175 (H) 10/04/2013 2025   BUN 10 11/16/2019 1505   BUN 15 04/23/2014 1040   BUN 13 10/04/2013 2025   CREATININE 1.04 11/16/2019 1505   CALCIUM 9.6 11/16/2019 1505   CALCIUM 9.2 10/04/2013 2025   PROT 6.8 11/16/2019 1505   PROT 7.4 10/04/2013 2025   ALBUMIN 4.6 10/18/2018 1406   ALBUMIN 4.1 10/04/2013 2025   AST 10 11/16/2019 1505   AST 8 (L) 10/04/2013 2025   ALT 13 11/16/2019 1505   ALT 19 10/04/2013 2025   ALKPHOS 98 10/18/2018 1406   ALKPHOS 114 10/04/2013 2025   BILITOT 0.6 11/16/2019 1505   BILITOT 0.4 10/04/2013 2025   GFRNONAA 83 11/16/2019 1505   GFRAA 96 11/16/2019 1505     Diabetic Labs (most recent): Lab Results  Component Value Date   HGBA1C 5.9 (H) 10/20/2019   HGBA1C 6.4 (A) 07/20/2019   HGBA1C 8.7 (A) 03/22/2019     Lipid Panel ( most recent) Lipid Panel     Component Value Date/Time   CHOL 186 11/16/2019 1505   CHOL 180 03/23/2013 0422   TRIG 175 (H) 11/16/2019 1505   TRIG 218 (H) 03/23/2013 0422   HDL 30 (L) 11/16/2019 1505   HDL 21 (L) 03/23/2013 0422   CHOLHDL 6.2 (H) 11/16/2019 1505   VLDL 53.8 (H) 10/18/2018 1406   VLDL 44 (H) 03/23/2013 0422   LDLCALC 127 (H) 11/16/2019 1505   LDLCALC 115 (H) 03/23/2013 0422   LDLDIRECT 176.0 10/18/2018 1406      Lab Results  Component Value Date   TSH 1.32 11/16/2019   TSH 1.38 10/18/2018   TSH 1.07 06/11/2016   TSH 1.66 03/12/2014   TSH 1.140 11/24/2013   TSH 1.42 02/25/2012   FREET4 1.2 11/16/2019   FREET4 1.00 04/23/2014      Assessment & Plan:   1.  Uncontrolled type 2 diabetes mellitus with hyperglycemia (HCC)  - [name] has currently uncontrolled symptomatic type 2 DM since  49 years of age. He does not monitor blood glucose regularly, reports lowest blood glucose readings he had was 115, highest 170.  His point-of-care A1c 6.4% improving from 8.7%.    Recent labs reviewed, showing normal renal function. - I had a long discussion with him about the progressive nature of diabetes and the pathology behind its complications. -his diabetes is complicated by smoking and he remains at a high risk for more acute and chronic complications which include CAD, CVA, CKD, retinopathy, and neuropathy. These are all discussed in detail with him.  - I have counseled him on diet  and weight management  by adopting a carbohydrate restricted/protein rich diet. Patient is encouraged to switch to  unprocessed or minimally processed     complex starch and increased protein intake (animal or plant source), fruits, and vegetables. -  he is advised to stick to a routine mealtimes to eat 3 meals  a day and avoid unnecessary snacks (  to snack only to correct hypoglycemia).  - he  admits there is a room for improvement in his diet and drink choices. -  Suggestion is made for him to avoid simple carbohydrates  from his diet including Cakes, Sweet Desserts / Pastries, Ice Cream, Soda (diet and regular), Sweet Tea, Candies, Chips, Cookies, Sweet Pastries,  Store Bought Juices, Alcohol in Excess of  1-2 drinks a day, Artificial Sweeteners, Coffee Creamer, and "Sugar-free" Products. This will help patient to have stable blood glucose profile and potentially avoid unintended weight gain.   - he will be scheduled with Jearld Fenton, RDN, CDE for diabetes education.  - I have approached him with the following individualized plan to manage  his diabetes and patient agrees:   -Based on his current glycemic response, he will benefit from de-escalation of his treatment.   He is advised to discontinue glipizide.  He is advised to continue Metformin 500 mg p.o. twice daily.     - Specific targets for  A1c;  LDL, HDL, Triglycerides, and  Waist Circumference were discussed with the patient.  2) Blood Pressure /Hypertension: -His blood pressure is controlled to target.  He is advised to continue amlodipine 10 mg p.o. daily at breakfast.   3) Lipids/Hyperlipidemia:   Review of his recent repeat lipid panel showed still high LDL of 167.  He has not been consistent taking his Crestor.  He is approached for better consistency with Crestor 10 mg p.o. nightly.      Side effects and precautions discussed with him.  4)  Weight/Diet:  Body mass index is 26.57 kg/m.  -    he is not a candidate for major weight loss. I discussed with him the fact that loss of 5 - 10% of his  current body weight will have the most impact on his diabetes management.  Exercise, and detailed carbohydrates information provided  -  detailed on discharge instructions.  5) Chronic Care/Health Maintenance:  -he  is on  Statin medications and  is encouraged to initiate and continue to follow up with Ophthalmology, Dentist,  Podiatrist at least yearly or according to recommendations, and advised to  quit smoking. -He is extensively counseled for smoking cessation.  -  I have recommended yearly flu vaccine and pneumonia vaccine at least every 5 years; moderate intensity exercise for up to 150 minutes weekly; and  sleep for at least 7 hours a day.  - he is  advised to maintain close follow up with [name], MD for primary care needs, as well as his other providers for optimal and coordinated care.     - Time spent on this patient care encounter:  20 minutes of which 50% was spent in  counseling and the rest reviewing  his current and  previous labs / studies and medications  doses and developing a plan for long term care. [name]  participated in the discussions, expressed understanding,  and voiced agreement with the above plans.  All questions were answered to his satisfaction. he is encouraged to contact clinic should he have any questions or concerns prior to his return visit.   Follow up plan: - Return in about 6 months (around 05/21/2020) for A1c -NV, Urine MA - NV.  Glade Lloyd, MD [facility] [address] [place], [place] [address] Phone: [phone]  Fax: [phone]    11/21/2019, 5:42 PM  This note was partially dictated with voice recognition software. Similar sounding words  can be transcribed inadequately or may not  be corrected upon review.

## 2019-11-21 NOTE — Patient Instructions (Signed)

## 2020-01-09 MED ORDER — PANTOPRAZOLE SODIUM 40 MG PO TBEC
DELAYED_RELEASE_TABLET | ORAL | 1 refills | Status: DC
Start: 2020-01-09 — End: 2020-07-22

## 2020-02-05 ENCOUNTER — Other Ambulatory Visit: Payer: Self-pay

## 2020-02-05 MED ORDER — AMLODIPINE BESYLATE 10 MG PO TABS: 10.0000 mg | ORAL_TABLET | Freq: Every day | ORAL | 1 refills | Status: DC

## 2020-03-20 ENCOUNTER — Ambulatory Visit
Admission: EM | Admit: 2020-03-20 | Discharge: 2020-03-20 | Disposition: A | Discharge status: Home / Self Care | Payer: BC Managed Care – PPO | Attending: Physician Assistant | Admitting: Physician Assistant

## 2020-03-20 ENCOUNTER — Emergency Department (HOSPITAL_COMMUNITY)
Admission: EM | Admit: 2020-03-20 | Discharge: 2020-03-20 | Disposition: A | Discharge status: Home / Self Care | Payer: BC Managed Care – PPO | Attending: Emergency Medicine | Admitting: Emergency Medicine

## 2020-03-20 ENCOUNTER — Other Ambulatory Visit: Payer: Self-pay

## 2020-03-20 ENCOUNTER — Ambulatory Visit (INDEPENDENT_AMBULATORY_CARE_PROVIDER_SITE_OTHER): Payer: BC Managed Care – PPO

## 2020-03-20 ENCOUNTER — Encounter (HOSPITAL_COMMUNITY): Payer: Self-pay

## 2020-03-20 ENCOUNTER — Encounter: Payer: Self-pay | Admitting: Emergency Medicine

## 2020-03-20 ENCOUNTER — Emergency Department (HOSPITAL_COMMUNITY): Payer: BC Managed Care – PPO

## 2020-03-20 DIAGNOSIS — F17210 Nicotine dependence, cigarettes, uncomplicated: Secondary | ICD-10-CM | POA: Insufficient documentation

## 2020-03-20 DIAGNOSIS — R079 Chest pain, unspecified: Secondary | ICD-10-CM

## 2020-03-20 DIAGNOSIS — R059 Cough, unspecified: Secondary | ICD-10-CM | POA: Diagnosis not present

## 2020-03-20 DIAGNOSIS — I509 Heart failure, unspecified: Secondary | ICD-10-CM | POA: Insufficient documentation

## 2020-03-20 DIAGNOSIS — Z7984 Long term (current) use of oral hypoglycemic drugs: Secondary | ICD-10-CM | POA: Diagnosis not present

## 2020-03-20 DIAGNOSIS — Z79899 Other long term (current) drug therapy: Secondary | ICD-10-CM | POA: Diagnosis not present

## 2020-03-20 DIAGNOSIS — E876 Hypokalemia: Secondary | ICD-10-CM | POA: Insufficient documentation

## 2020-03-20 DIAGNOSIS — E1140 Type 2 diabetes mellitus with diabetic neuropathy, unspecified: Secondary | ICD-10-CM | POA: Insufficient documentation

## 2020-03-20 DIAGNOSIS — I110 Hypertensive heart disease with heart failure: Secondary | ICD-10-CM | POA: Diagnosis not present

## 2020-03-20 DIAGNOSIS — R0789 Other chest pain: Secondary | ICD-10-CM | POA: Insufficient documentation

## 2020-03-20 DIAGNOSIS — I10 Essential (primary) hypertension: Secondary | ICD-10-CM | POA: Diagnosis not present

## 2020-03-20 LAB — CBC
HCT: 34.9 % — ABNORMAL LOW (ref 39.0–52.0)
Hemoglobin: 12.2 g/dL — ABNORMAL LOW (ref 13.0–17.0)
MCH: 28.4 pg (ref 26.0–34.0)
MCHC: 35 g/dL (ref 30.0–36.0)
MCV: 81.2 fL (ref 80.0–100.0)
Platelets: 249 10*3/uL (ref 150–400)
RBC: 4.3 MIL/uL (ref 4.22–5.81)
RDW: 13.8 % (ref 11.5–15.5)
WBC: 7.4 10*3/uL (ref 4.0–10.5)
nRBC: 0 % (ref 0.0–0.2)

## 2020-03-20 LAB — BASIC METABOLIC PANEL
Anion gap: 9 (ref 5–15)
BUN: 14 mg/dL (ref 6–20)
CO2: 24 mmol/L (ref 22–32)
Calcium: 8.7 mg/dL — ABNORMAL LOW (ref 8.9–10.3)
Chloride: 107 mmol/L (ref 98–111)
Creatinine, Ser: 1.17 mg/dL (ref 0.61–1.24)
GFR, Estimated: >60 mL/min (ref >60)
Glucose, Bld: 172 mg/dL — ABNORMAL HIGH (ref 70–99)
Potassium: 3.2 mmol/L — ABNORMAL LOW (ref 3.5–5.1)
Sodium: 140 mmol/L (ref 135–145)

## 2020-03-20 LAB — TROPONIN I (HIGH SENSITIVITY)
Troponin I (High Sensitivity): 3 ng/L (ref <18)
Troponin I (High Sensitivity): 4 ng/L (ref <18)

## 2020-03-20 MED ORDER — POTASSIUM CHLORIDE 20 MEQ PO PACK: 40.0000 meq | PACK | Freq: Once | ORAL | Status: DC

## 2020-03-20 NOTE — Discharge Instructions (Signed)
You have been seen and discharged from the emergency department.  Your potassium level was slightly low, you were given replacement in the emergency department.  Follow-up as an outpatient to have repeat blood work done to evaluate your potassium level.  Follow-up with your primary provider for further cardiac evaluation. Take home medications as prescribed. If you have any worsening symptoms, severe chest pain, difficulty breathing or further concerns for health please return to an emergency department for further evaluation.

## 2020-03-20 NOTE — Discharge Instructions (Signed)
Go to the Emergency department for evaluation  

## 2020-03-20 NOTE — ED Triage Notes (Signed)
Pain to LT chest when breathing and has sharp pains in that area randomly x 3 days.  Pt does report mild cough as well.

## 2020-03-20 NOTE — ED Provider Notes (Signed)
[facility]    CSN: [id number] Arrival date & time: 03/20/20  1211      History   Chief Complaint Chief Complaint  Patient presents with  . Chest Pain    HPI [name] is a 50 y.o. male.   The history is provided by the patient. No language interpreter was used.  Chest Pain Pain location:  L chest Pain quality: aching   Pain radiates to:  Does not radiate Pain severity:  Moderate Onset quality:  Gradual Duration:  3 days Timing:  Constant Progression:  Worsening Context: movement   Relieved by:  Nothing Worsened by:  Nothing Ineffective treatments:  None tried Associated symptoms: no abdominal pain   Risk factors: diabetes mellitus, high cholesterol, hypertension, male sex and smoking     Past Medical History:  Diagnosis Date  . CHF (congestive heart failure) ([place])   . Diabetes mellitus without complication ([place])   . DVT (deep venous thrombosis) ([place]) 04/2017  . GERD (gastroesophageal reflux disease)   . History of migraine    none in over 2 yrs  . Hyperlipidemia   . Hypertension   . Syncope and collapse    x1 - approx 2-3 yrs ago - dehydration    Patient Active Problem List   Diagnosis Date Noted  . Sensation of chest tightness 04/28/2019  . Uncontrolled type 2 diabetes mellitus with hyperglycemia ([place]) 10/26/2018  . Mixed hyperlipidemia 10/26/2018  . Chronic idiopathic constipation   . Change in bowel habits   . Polyp of sigmoid colon   . Rectal polyp   . Chronic pain of right hip 10/15/2015  . Neuropathy 07/24/2015  . Essential hypertension 07/24/2015  . Dyslipidemia with low high density lipoprotein (HDL) cholesterol with hypertriglyceridemia due to type 2 diabetes mellitus ([place]) 07/24/2015  . Problems with swallowing and mastication   . Encounter for preventive health examination 10/26/2014  . Pituitary insufficiency ([place]) 09/12/2014  . Low testosterone 09/11/2014  . Onychomycosis 04/18/2014  . Abdominal pain 11/26/2013  .  Diabetes mellitus with microalbuminuric diabetic nephropathy ([place]) 11/26/2013  . Current smoker 11/24/2013  . Hiatal hernia 11/15/2013  . Tubular adenoma of colon 11/15/2013  . History of Helicobacter pylori infection 10/18/2013  . Gastritis due to nonsteroidal anti-inflammatory drug (NSAID) 10/14/2013  . HTN (hypertension) 04/26/2013  . Anxiety 04/26/2013  . Chest pain 10/12/2011  . Back ache 09/28/2011    Past Surgical History:  Procedure Laterality Date  . COLONOSCOPY    . COLONOSCOPY WITH PROPOFOL N/A 08/06/2017   Procedure: COLONOSCOPY WITH PROPOFOL;  Surgeon: Lucilla Lame, MD;  Location: [place];  Service: Endoscopy;  Laterality: N/A;  Diabetic - oral meds  . ESOPHAGEAL DILATION  09/24/2016   Procedure: ESOPHAGEAL DILATION;  Surgeon: Lucilla Lame, MD;  Location: [place];  Service: Gastroenterology;;  . ESOPHAGOGASTRODUODENOSCOPY (EGD) WITH PROPOFOL N/A 04/08/2015   Procedure: ESOPHAGOGASTRODUODENOSCOPY (EGD) WITH PROPOFOL with dialation;  Surgeon: Lucilla Lame, MD;  Location: [place];  Service: Endoscopy;  Laterality: N/A;  diabetic - oral meds  . ESOPHAGOGASTRODUODENOSCOPY (EGD) WITH PROPOFOL N/A 09/24/2016   Procedure: ESOPHAGOGASTRODUODENOSCOPY (EGD) WITH PROPOFOL WITH DILATION;  Surgeon: Lucilla Lame, MD;  Location: [place];  Service: Gastroenterology;  Laterality: N/A;  Diabetic - oral meds  . FINGER AMPUTATION  2009   partial removal left index finger   . POLYPECTOMY  08/06/2017   Procedure: POLYPECTOMY INTESTINAL;  Surgeon: Lucilla Lame, MD;  Location: [place];  Service: Endoscopy;;  Home Medications    Prior to Admission medications   Medication Sig Start Date End Date Taking? Authorizing Provider  albuterol (VENTOLIN HFA) 108 (90 Base) MCG/ACT inhaler Inhale 2 puffs into the lungs every 6 (six) hours as needed for wheezing or shortness of breath. 04/28/19   [name], MD  amLODipine (NORVASC) 10 MG  tablet Take 1 tablet (10 mg total) by mouth daily. 02/05/20   [name], MD  cyanocobalamin 1000 MCG tablet Take 100 mcg by mouth daily.    [provider]  glucose blood (ONETOUCH VERIO) test strip Test sugars twice daily 04/26/14   Phadke, Radhika P, MD  lactulose (CHRONULAC) 10 GM/15ML solution Take 45 mLs (30 g total) by mouth daily as needed for mild constipation. 10/20/19   [name], MD  metFORMIN (GLUCOPHAGE) 500 MG tablet Take 1 tablet (500 mg total) by mouth 2 (two) times daily with a meal. 10/04/19   Nida, Marella Chimes, MD  [facility] DELICA LANCETS 53G MISC Test sugars twice daily 04/26/14   Phadke, Radhika P, MD  pantoprazole (PROTONIX) 40 MG tablet TAKE 1 TABLET(40 MG) BY MOUTH DAILY 01/09/20   [name], MD  Plecanatide (TRULANCE) 3 MG TABS Take 3 mg by mouth daily. 11/02/18   [name], MD  rosuvastatin (CRESTOR) 40 MG tablet Take 1 tablet (40 mg total) by mouth at bedtime. 10/22/19   [name], MD    Family History Family History  Problem Relation Age of Onset  . Heart disease Father        CAD  . Hypertension Father   . Diabetes Father   . Hypertension Sister   . Hypertension Brother   . Hypertension Brother   . Hypertension Mother     Social History Social History   Tobacco Use  . Smoking status: Current Every Day Smoker    Packs/day: 2.00    Years: 27.00    Pack years: 54.00    Types: Cigarettes  . Smokeless tobacco: Never Used  Vaping Use  . Vaping Use: Never used  Substance Use Topics  . Alcohol use: No  . Drug use: No     Allergies   Patient has no known allergies.   Review of Systems Review of Systems  Cardiovascular: Positive for chest pain.  Gastrointestinal: Negative for abdominal pain.  All other systems reviewed and are negative.    Physical Exam Triage Vital Signs ED Triage Vitals  Enc Vitals Group     BP 03/20/20 1239 135/87     Pulse Rate 03/20/20 1239 78     Resp 03/20/20 1239 18     Temp  03/20/20 1239 97.9 F (36.6 C)     Temp Source 03/20/20 1239 Oral     SpO2 03/20/20 1239 97 %     Weight 03/20/20 1238 215 lb (97.5 kg)     Height 03/20/20 1238 6\' 1"  (1.854 m)     Head Circumference --      Peak Flow --      Pain Score 03/20/20 1237 8     Pain Loc --      Pain Edu? --      Excl. in [place]? --    No data found.  Updated Vital Signs BP 135/87 (BP Location: Right Arm)   Pulse 78   Temp 97.9 F (36.6 C) (Oral)   Resp 18   Ht 6\' 1"  (1.854 m)   Wt 97.5 kg   SpO2 97%  BMI 28.37 kg/m   Visual Acuity Right Eye Distance:   Left Eye Distance:   Bilateral Distance:    Right Eye Near:   Left Eye Near:    Bilateral Near:     Physical Exam Vitals and nursing note reviewed.  Constitutional:      Appearance: He is well-developed and well-nourished.  HENT:     Head: Normocephalic and atraumatic.  Eyes:     Conjunctiva/sclera: Conjunctivae normal.  Cardiovascular:     Rate and Rhythm: Normal rate and regular rhythm.     Heart sounds: Normal heart sounds. No murmur heard.   Pulmonary:     Effort: Pulmonary effort is normal. No respiratory distress.     Breath sounds: Normal breath sounds.  Abdominal:     Palpations: Abdomen is soft.     Tenderness: There is no abdominal tenderness.  Musculoskeletal:        General: No edema.     Cervical back: Neck supple.  Skin:    General: Skin is warm and dry.  Neurological:     General: No focal deficit present.     Mental Status: He is alert.  Psychiatric:        Mood and Affect: Mood and affect and mood normal.      UC Treatments / Results  Labs (all labs ordered are listed, but only abnormal results are displayed) Labs Reviewed - No data to display  EKG   Radiology DG Chest 2 View  Result Date: 03/20/2020 CLINICAL DATA:  Cough.  Chest pain. EXAM: CHEST - 2 VIEW COMPARISON:  04/26/2019. FINDINGS: Mediastinum and hilar structures normal. Lungs are clear. No pleural effusion or pneumothorax. Heart size  normal. Degenerative change thoracic spine. IMPRESSION: No acute cardiopulmonary disease. Electronically Signed   By: [name]  Register   On: 03/20/2020 13:39    Procedures Procedures (including critical care time)  Medications Ordered in UC Medications - No data to display  Initial Impression / Assessment and Plan / UC Course  I have reviewed the triage vital signs and the nursing notes.  Pertinent labs & imaging results that were available during my care of the patient were reviewed by me and considered in my medical decision making (see chart for details).     MDM: Chest xray  Normal  EKG  No st changes,  I am concerned that pt already has a heart score of 4 before troponin.  Pt advised to go to ED to have labs and further evaluation.  Final Clinical Impressions(s) / UC Diagnoses   Final diagnoses:  Chest pain, unspecified type     Discharge Instructions     Go to the Emergency department for evaluation.     ED Prescriptions    None     PDMP not reviewed this encounter.   Fransico Meadow, [place] 03/20/20 [phone]

## 2020-03-20 NOTE — ED Notes (Signed)
Pt left facility before discharge instructions or meds given.

## 2020-03-20 NOTE — ED Provider Notes (Signed)
[facility] EMERGENCY DEPARTMENT Provider Note   CSN: [id number] Arrival date & time: 03/20/20  1716     History Chief Complaint  Patient presents with  . Chest Pain    GEHRIG PATRAS is a 50 y.o. male.  HPI   50 year old male with past medical history of HTN, HLD, CHF, DM, provoked DVT not on anticoagulation presents the emergency department with left-sided chest discomfort.  Patient states has been going on for the past couple days.  It is along the left chest.  Sometimes worse with deep breaths, worse with certain movements.  He has no associated shortness of breath, cough/hemoptysis.  Denies any fever, swelling of his lower extremities, GI symptoms.  He has no known history of cardiac disease.  Past Medical History:  Diagnosis Date  . CHF (congestive heart failure) ([place])   . Diabetes mellitus without complication ([place])   . DVT (deep venous thrombosis) ([place]) 04/2017  . GERD (gastroesophageal reflux disease)   . History of migraine    none in over 2 yrs  . Hyperlipidemia   . Hypertension   . Syncope and collapse    x1 - approx 2-3 yrs ago - dehydration    Patient Active Problem List   Diagnosis Date Noted  . Sensation of chest tightness 04/28/2019  . Uncontrolled type 2 diabetes mellitus with hyperglycemia ([place]) 10/26/2018  . Mixed hyperlipidemia 10/26/2018  . Chronic idiopathic constipation   . Change in bowel habits   . Polyp of sigmoid colon   . Rectal polyp   . Chronic pain of right hip 10/15/2015  . Neuropathy 07/24/2015  . Essential hypertension 07/24/2015  . Dyslipidemia with low high density lipoprotein (HDL) cholesterol with hypertriglyceridemia due to type 2 diabetes mellitus ([place]) 07/24/2015  . Problems with swallowing and mastication   . Encounter for preventive health examination 10/26/2014  . Pituitary insufficiency ([place]) 09/12/2014  . Low testosterone 09/11/2014  . Onychomycosis 04/18/2014  . Abdominal pain 11/26/2013  . Diabetes mellitus with  microalbuminuric diabetic nephropathy ([place]) 11/26/2013  . Current smoker 11/24/2013  . Hiatal hernia 11/15/2013  . Tubular adenoma of colon 11/15/2013  . History of Helicobacter pylori infection 10/18/2013  . Gastritis due to nonsteroidal anti-inflammatory drug (NSAID) 10/14/2013  . HTN (hypertension) 04/26/2013  . Anxiety 04/26/2013  . Chest pain 10/12/2011  . Back ache 09/28/2011    Past Surgical History:  Procedure Laterality Date  . COLONOSCOPY    . COLONOSCOPY WITH PROPOFOL N/A 08/06/2017   Procedure: COLONOSCOPY WITH PROPOFOL;  Surgeon: Lucilla Lame, MD;  Location: [place];  Service: Endoscopy;  Laterality: N/A;  Diabetic - oral meds  . ESOPHAGEAL DILATION  09/24/2016   Procedure: ESOPHAGEAL DILATION;  Surgeon: Lucilla Lame, MD;  Location: [place];  Service: Gastroenterology;;  . ESOPHAGOGASTRODUODENOSCOPY (EGD) WITH PROPOFOL N/A 04/08/2015   Procedure: ESOPHAGOGASTRODUODENOSCOPY (EGD) WITH PROPOFOL with dialation;  Surgeon: Lucilla Lame, MD;  Location: [place];  Service: Endoscopy;  Laterality: N/A;  diabetic - oral meds  . ESOPHAGOGASTRODUODENOSCOPY (EGD) WITH PROPOFOL N/A 09/24/2016   Procedure: ESOPHAGOGASTRODUODENOSCOPY (EGD) WITH PROPOFOL WITH DILATION;  Surgeon: Lucilla Lame, MD;  Location: [place];  Service: Gastroenterology;  Laterality: N/A;  Diabetic - oral meds  . FINGER AMPUTATION  2009   partial removal left index finger   . POLYPECTOMY  08/06/2017   Procedure: POLYPECTOMY INTESTINAL;  Surgeon: Lucilla Lame, MD;  Location: [place];  Service: Endoscopy;;       Family History  Problem Relation Age  of Onset  . Heart disease Father        CAD  . Hypertension Father   . Diabetes Father   . Hypertension Sister   . Hypertension Brother   . Hypertension Brother   . Hypertension Mother     Social History   Tobacco Use  . Smoking status: Current Every Day Smoker    Packs/day: 2.00    Years: 27.00    Pack  years: 54.00    Types: Cigarettes  . Smokeless tobacco: Never Used  Vaping Use  . Vaping Use: Never used  Substance Use Topics  . Alcohol use: No  . Drug use: No    Home Medications Prior to Admission medications   Medication Sig Start Date End Date Taking? Authorizing Provider  albuterol (VENTOLIN HFA) 108 (90 Base) MCG/ACT inhaler Inhale 2 puffs into the lungs every 6 (six) hours as needed for wheezing or shortness of breath. 04/28/19   [name], MD  amLODipine (NORVASC) 10 MG tablet Take 1 tablet (10 mg total) by mouth daily. 02/05/20   [name], MD  cyanocobalamin 1000 MCG tablet Take 100 mcg by mouth daily.    [provider]  glucose blood (ONETOUCH VERIO) test strip Test sugars twice daily 04/26/14   Phadke, Radhika P, MD  lactulose (CHRONULAC) 10 GM/15ML solution Take 45 mLs (30 g total) by mouth daily as needed for mild constipation. 10/20/19   [name], MD  metFORMIN (GLUCOPHAGE) 500 MG tablet Take 1 tablet (500 mg total) by mouth 2 (two) times daily with a meal. 10/04/19   Nida, Marella Chimes, MD  [facility] DELICA LANCETS 40X MISC Test sugars twice daily 04/26/14   Phadke, Radhika P, MD  pantoprazole (PROTONIX) 40 MG tablet TAKE 1 TABLET(40 MG) BY MOUTH DAILY 01/09/20   [name], MD  Plecanatide (TRULANCE) 3 MG TABS Take 3 mg by mouth daily. 11/02/18   [name], MD  rosuvastatin (CRESTOR) 40 MG tablet Take 1 tablet (40 mg total) by mouth at bedtime. 10/22/19   [name], MD    Allergies    Patient has no known allergies.  Review of Systems   Review of Systems  Constitutional: Negative for chills and fever.  HENT: Negative for congestion.   Eyes: Negative for visual disturbance.  Respiratory: Negative for cough, choking, chest tightness and shortness of breath.   Cardiovascular: Positive for chest pain. Negative for palpitations and leg swelling.  Gastrointestinal: Negative for abdominal pain, diarrhea and vomiting.   Genitourinary: Negative for dysuria.  Skin: Negative for rash.  Neurological: Negative for headaches.    Physical Exam Updated Vital Signs BP (!) 168/88   Pulse 77   Temp 98 F (36.7 C) (Oral)   Resp 18   Ht 6\' 1"  (1.854 m)   Wt 97.5 kg   SpO2 99%   BMI 28.37 kg/m   Physical Exam Vitals and nursing note reviewed.  Constitutional:      Appearance: Normal appearance.  HENT:     Head: Normocephalic.     Mouth/Throat:     Mouth: Mucous membranes are moist.  Cardiovascular:     Rate and Rhythm: Normal rate.  Pulmonary:     Effort: Pulmonary effort is normal. No tachypnea or respiratory distress.     Breath sounds: No decreased breath sounds or wheezing.  Chest:     Comments: Patient points to the left costochondral joint as to where his discomfort is, with certain movements of  the left arm this reproduces the discomfort, if he takes an a very large breath this hurts the area as well.  The discomfort is somewhat reproducible.  No palpated crepitus. Abdominal:     Palpations: Abdomen is soft.     Tenderness: There is no abdominal tenderness.  Musculoskeletal:     Right lower leg: No edema.     Left lower leg: No edema.  Skin:    General: Skin is warm.  Neurological:     Mental Status: He is alert and oriented to person, place, and time. Mental status is at baseline.  Psychiatric:        Mood and Affect: Mood normal.     ED Results / Procedures / Treatments   Labs (all labs ordered are listed, but only abnormal results are displayed) Labs Reviewed  BASIC METABOLIC PANEL - Abnormal; Notable for the following components:      Result Value   Potassium 3.2 (*)    Glucose, Bld 172 (*)    Calcium 8.7 (*)    All other components within normal limits  CBC - Abnormal; Notable for the following components:   Hemoglobin 12.2 (*)    HCT 34.9 (*)    All other components within normal limits  TROPONIN I (HIGH SENSITIVITY)  TROPONIN I (HIGH SENSITIVITY)    EKG EKG  Interpretation  Date/Time:  Wednesday March 20 2020 17:25:47 EST Ventricular Rate:  79 PR Interval:  228 QRS Duration: 80 QT Interval:  376 QTC Calculation: 431 R Axis:   40 Text Interpretation: Sinus rhythm with 1st degree A-V block Otherwise normal ECG NSR, T wave changes old Confirmed by Lavenia Atlas [phone]) on 03/20/2020 5:47:39 PM   Radiology DG Chest 2 View  Result Date: 03/20/2020 CLINICAL DATA:  Hypertension, diabetes, left-sided chest pain for 1 week EXAM: CHEST - 2 VIEW COMPARISON:  03/20/2020 at 1:16 p.m. FINDINGS: The heart size and mediastinal contours are within normal limits. Both lungs are clear. The visualized skeletal structures are unremarkable. IMPRESSION: No active cardiopulmonary disease. Electronically Signed   By: Randa Ngo M.D.   On: 03/20/2020 18:18   DG Chest 2 View  Result Date: 03/20/2020 CLINICAL DATA:  Cough.  Chest pain. EXAM: CHEST - 2 VIEW COMPARISON:  04/26/2019. FINDINGS: Mediastinum and hilar structures normal. Lungs are clear. No pleural effusion or pneumothorax. Heart size normal. Degenerative change thoracic spine. IMPRESSION: No acute cardiopulmonary disease. Electronically Signed   By: [name]  Register   On: 03/20/2020 13:39    Procedures Procedures   Medications Ordered in ED Medications  potassium chloride (KLOR-CON) packet 40 mEq (has no administration in time range)    ED Course  I have reviewed the triage vital signs and the nursing notes.  Pertinent labs & imaging results that were available during my care of the patient were reviewed by me and considered in my medical decision making (see chart for details).    MDM Rules/Calculators/A&P                          50 year old male presents the emergency department left-sided chest discomfort.  This has been intermittent for over a week, nonexertional, not associated with any shortness of breath, cough/hemoptysis.  No swelling of his lower extremities.  Certain movements  makes the pain worse, if he takes a large deep breath this can reproduce the pain, the discomfort is somewhat reproducible on physical exam as well.  EKG shows sinus rhythm, nonspecific  T wave changes which is present on previous.  Chest x-ray shows no focal finding.  Blood work shows a troponin trending from 3-4, potassium is slightly low at 3.2, replacement given.  Patient has no tachycardia, hypoxia or tachypnea.  He displays no respiratory distress.  He is speaking in full sentences and is comfortable.  At rest and without start movement/large deep breaths he is pain-free.  I have a very low suspicion for PE given his normal vitals.  Low suspicion for ACS given his reassuring EKG and troponin and the musculoskeletal component of the chest pain.  This does not strike me as cardiopulmonary, this appears more musculoskeletal.  Discussed with the patient calling his primary doctor and following up tomorrow for reevaluation.  He agrees.  Patient will be discharged and treated as an outpatient.  Discharge plan and strict return to ED precautions discussed, patient verbalizes understanding and agreement.  Final Clinical Impression(s) / ED Diagnoses Final diagnoses:  Atypical chest pain  Hypokalemia    Rx / DC Orders ED Discharge Orders    None       Lorelle Gibbs, DO 03/20/20 2302

## 2020-03-20 NOTE — ED Triage Notes (Signed)
Pt to er, pt states that he is here for some chest pain, states that he has had the pain for the past week, states that the pain is worse when he takes a deep breath.  Pt talking in full sentences, states that he has had a similar episode in the past and was dx with pleurisy.

## 2020-03-24 ENCOUNTER — Emergency Department (HOSPITAL_COMMUNITY): Payer: BC Managed Care – PPO

## 2020-03-24 ENCOUNTER — Other Ambulatory Visit: Payer: Self-pay

## 2020-03-24 ENCOUNTER — Emergency Department (HOSPITAL_COMMUNITY)
Admission: EM | Admit: 2020-03-24 | Discharge: 2020-03-24 | Disposition: A | Discharge status: Home / Self Care | Payer: BC Managed Care – PPO | Attending: Emergency Medicine | Admitting: Emergency Medicine

## 2020-03-24 DIAGNOSIS — Z79899 Other long term (current) drug therapy: Secondary | ICD-10-CM | POA: Insufficient documentation

## 2020-03-24 DIAGNOSIS — Z7984 Long term (current) use of oral hypoglycemic drugs: Secondary | ICD-10-CM | POA: Insufficient documentation

## 2020-03-24 DIAGNOSIS — F17210 Nicotine dependence, cigarettes, uncomplicated: Secondary | ICD-10-CM | POA: Insufficient documentation

## 2020-03-24 DIAGNOSIS — I110 Hypertensive heart disease with heart failure: Secondary | ICD-10-CM | POA: Diagnosis not present

## 2020-03-24 DIAGNOSIS — R0781 Pleurodynia: Secondary | ICD-10-CM | POA: Diagnosis not present

## 2020-03-24 DIAGNOSIS — R0789 Other chest pain: Secondary | ICD-10-CM | POA: Insufficient documentation

## 2020-03-24 DIAGNOSIS — I509 Heart failure, unspecified: Secondary | ICD-10-CM | POA: Diagnosis not present

## 2020-03-24 DIAGNOSIS — R079 Chest pain, unspecified: Secondary | ICD-10-CM | POA: Diagnosis not present

## 2020-03-24 DIAGNOSIS — E1140 Type 2 diabetes mellitus with diabetic neuropathy, unspecified: Secondary | ICD-10-CM | POA: Diagnosis not present

## 2020-03-24 DIAGNOSIS — R002 Palpitations: Secondary | ICD-10-CM | POA: Diagnosis not present

## 2020-03-24 LAB — CBC WITH DIFFERENTIAL/PLATELET
Abs Immature Granulocytes: 0.03 10*3/uL (ref 0.00–0.07)
Basophils Absolute: 0.1 10*3/uL (ref 0.0–0.1)
Basophils Relative: 1 %
Eosinophils Absolute: 0.2 10*3/uL (ref 0.0–0.5)
Eosinophils Relative: 3 %
HCT: 37.1 % — ABNORMAL LOW (ref 39.0–52.0)
Hemoglobin: 13.3 g/dL (ref 13.0–17.0)
Immature Granulocytes: 0 %
Lymphocytes Relative: 23 %
Lymphs Abs: 1.8 10*3/uL (ref 0.7–4.0)
MCH: 28.7 pg (ref 26.0–34.0)
MCHC: 35.8 g/dL (ref 30.0–36.0)
MCV: 80 fL (ref 80.0–100.0)
Monocytes Absolute: 0.5 10*3/uL (ref 0.1–1.0)
Monocytes Relative: 6 %
Neutro Abs: 5.4 10*3/uL (ref 1.7–7.7)
Neutrophils Relative %: 67 %
Platelets: 274 10*3/uL (ref 150–400)
RBC: 4.64 MIL/uL (ref 4.22–5.81)
RDW: 13.4 % (ref 11.5–15.5)
WBC: 8 10*3/uL (ref 4.0–10.5)
nRBC: 0 % (ref 0.0–0.2)

## 2020-03-24 LAB — COMPREHENSIVE METABOLIC PANEL
ALT: 27 U/L (ref 0–44)
AST: 15 U/L (ref 15–41)
Albumin: 4.3 g/dL (ref 3.5–5.0)
Alkaline Phosphatase: 98 U/L (ref 38–126)
Anion gap: 10 (ref 5–15)
BUN: 12 mg/dL (ref 6–20)
CO2: 24 mmol/L (ref 22–32)
Calcium: 8.8 mg/dL — ABNORMAL LOW (ref 8.9–10.3)
Chloride: 106 mmol/L (ref 98–111)
Creatinine, Ser: 0.82 mg/dL (ref 0.61–1.24)
GFR, Estimated: >60 mL/min (ref >60)
Glucose, Bld: 142 mg/dL — ABNORMAL HIGH (ref 70–99)
Potassium: 3.1 mmol/L — ABNORMAL LOW (ref 3.5–5.1)
Sodium: 140 mmol/L (ref 135–145)
Total Bilirubin: 0.5 mg/dL (ref 0.3–1.2)
Total Protein: 7.1 g/dL (ref 6.5–8.1)

## 2020-03-24 LAB — TROPONIN I (HIGH SENSITIVITY): Troponin I (High Sensitivity): 4 ng/L (ref <18)

## 2020-03-24 MED ORDER — KETOROLAC TROMETHAMINE 30 MG/ML IJ SOLN
30.0000 mg | Freq: Once | INTRAMUSCULAR | Status: AC
  Administered 2020-03-24: 30 mg via INTRAVENOUS
  Filled 2020-03-24: qty 1

## 2020-03-24 MED ORDER — HYDROCODONE-ACETAMINOPHEN 5-325 MG PO TABS
1.0000 | ORAL_TABLET | Freq: Once | ORAL | Status: AC
  Administered 2020-03-24: 1 via ORAL
  Filled 2020-03-24: qty 1

## 2020-03-24 MED ORDER — INDOMETHACIN 50 MG PO CAPS: 50.0000 mg | ORAL_CAPSULE | Freq: Two times a day (BID) | ORAL | 0 refills | Status: DC

## 2020-03-24 MED ORDER — POTASSIUM CHLORIDE ER 10 MEQ PO TBCR: 10.0000 meq | EXTENDED_RELEASE_TABLET | Freq: Every day | ORAL | 0 refills | Status: DC

## 2020-03-24 MED ORDER — POTASSIUM CHLORIDE CRYS ER 20 MEQ PO TBCR
40.0000 meq | EXTENDED_RELEASE_TABLET | Freq: Once | ORAL | Status: AC
  Administered 2020-03-24: 40 meq via ORAL
  Filled 2020-03-24: qty 2

## 2020-03-24 NOTE — ED Notes (Signed)
Patient transported to X-ray 

## 2020-03-24 NOTE — ED Triage Notes (Signed)
Pt here pov from [place] with CP; was here recently for the same states it isn't any better. That it is feeling worse. Called his PCP but they wanted to do a virtual visit so he came here. Took 325 asa this morning. Pain on palpation. No SOB, cough, dizziness.

## 2020-03-24 NOTE — ED Provider Notes (Signed)
[facility] EMERGENCY DEPARTMENT Provider Note   CSN: [id number] Arrival date & time: 03/24/20  2107     History Chief Complaint  Patient presents with  . Chest Pain    Jacob Baker is a 50 y.o. male.  HPI Patient is a 50 year old male with a history of hypertension, hyperlipidemia, CHF, DM, provoked DVT not on anticoagulation, who presents the emergency department due to left-sided chest pain.  Patient seen with the same complaints 4 days ago.  Had a reassuring cardiac work-up and was discharged home in stable condition.  He states that his symptoms have persisted.  Notes pain along the left chest but seems to be worst along the left ribs diffusely.  Worsens with movement of the left arm as well as with palpation.  Denies any shortness of breath, cough, dizziness, diaphoresis, abdominal pain, nausea, vomiting.  No other complaints at this time.  Upon further questioning, patient notes a history of similar symptoms about 4 to 5 years ago.  States that he was diagnosed with pleurisy and discharged on anti-inflammatories.  He states these provided relief.    Past Medical History:  Diagnosis Date  . CHF (congestive heart failure) ([place])   . Diabetes mellitus without complication ([place])   . DVT (deep venous thrombosis) ([place]) 04/2017  . GERD (gastroesophageal reflux disease)   . History of migraine    none in over 2 yrs  . Hyperlipidemia   . Hypertension   . Syncope and collapse    x1 - approx 2-3 yrs ago - dehydration    Patient Active Problem List   Diagnosis Date Noted  . Sensation of chest tightness 04/28/2019  . Uncontrolled type 2 diabetes mellitus with hyperglycemia ([place]) 10/26/2018  . Mixed hyperlipidemia 10/26/2018  . Chronic idiopathic constipation   . Change in bowel habits   . Polyp of sigmoid colon   . Rectal polyp   . Chronic pain of right hip 10/15/2015  . Neuropathy 07/24/2015  . Essential hypertension 07/24/2015  . Dyslipidemia with low high density  lipoprotein (HDL) cholesterol with hypertriglyceridemia due to type 2 diabetes mellitus ([place]) 07/24/2015  . Problems with swallowing and mastication   . Encounter for preventive health examination 10/26/2014  . Pituitary insufficiency ([place]) 09/12/2014  . Low testosterone 09/11/2014  . Onychomycosis 04/18/2014  . Abdominal pain 11/26/2013  . Diabetes mellitus with microalbuminuric diabetic nephropathy ([place]) 11/26/2013  . Current smoker 11/24/2013  . Hiatal hernia 11/15/2013  . Tubular adenoma of colon 11/15/2013  . History of Helicobacter pylori infection 10/18/2013  . Gastritis due to nonsteroidal anti-inflammatory drug (NSAID) 10/14/2013  . HTN (hypertension) 04/26/2013  . Anxiety 04/26/2013  . Chest pain 10/12/2011  . Back ache 09/28/2011    Past Surgical History:  Procedure Laterality Date  . COLONOSCOPY    . COLONOSCOPY WITH PROPOFOL N/A 08/06/2017   Procedure: COLONOSCOPY WITH PROPOFOL;  Surgeon: Lucilla Lame, MD;  Location: [place];  Service: Endoscopy;  Laterality: N/A;  Diabetic - oral meds  . ESOPHAGEAL DILATION  09/24/2016   Procedure: ESOPHAGEAL DILATION;  Surgeon: Lucilla Lame, MD;  Location: [place];  Service: Gastroenterology;;  . ESOPHAGOGASTRODUODENOSCOPY (EGD) WITH PROPOFOL N/A 04/08/2015   Procedure: ESOPHAGOGASTRODUODENOSCOPY (EGD) WITH PROPOFOL with dialation;  Surgeon: Lucilla Lame, MD;  Location: [place];  Service: Endoscopy;  Laterality: N/A;  diabetic - oral meds  . ESOPHAGOGASTRODUODENOSCOPY (EGD) WITH PROPOFOL N/A 09/24/2016   Procedure: ESOPHAGOGASTRODUODENOSCOPY (EGD) WITH PROPOFOL WITH DILATION;  Surgeon: Lucilla Lame, MD;  Location: [facility];  Service: Gastroenterology;  Laterality: N/A;  Diabetic - oral meds  . FINGER AMPUTATION  2009   partial removal left index finger   . POLYPECTOMY  08/06/2017   Procedure: POLYPECTOMY INTESTINAL;  Surgeon: Lucilla Lame, MD;  Location: [place];  Service:  Endoscopy;;       Family History  Problem Relation Age of Onset  . Heart disease Father        CAD  . Hypertension Father   . Diabetes Father   . Hypertension Sister   . Hypertension Brother   . Hypertension Brother   . Hypertension Mother     Social History   Tobacco Use  . Smoking status: Current Every Day Smoker    Packs/day: 2.00    Years: 27.00    Pack years: 54.00    Types: Cigarettes  . Smokeless tobacco: Never Used  Vaping Use  . Vaping Use: Never used  Substance Use Topics  . Alcohol use: No  . Drug use: No    Home Medications Prior to Admission medications   Medication Sig Start Date End Date Taking? Authorizing Provider  indomethacin (INDOCIN) 50 MG capsule Take 1 capsule (50 mg total) by mouth 2 (two) times daily with a meal. 03/24/20  Yes Fawna Cranmer, PA-C  potassium chloride (KLOR-CON) 10 MEQ tablet Take 1 tablet (10 mEq total) by mouth daily. 03/24/20  Yes Rayna Sexton, PA-C  albuterol (VENTOLIN HFA) 108 (90 Base) MCG/ACT inhaler Inhale 2 puffs into the lungs every 6 (six) hours as needed for wheezing or shortness of breath. 04/28/19   [name], MD  amLODipine (NORVASC) 10 MG tablet Take 1 tablet (10 mg total) by mouth daily. 02/05/20   [name], MD  cyanocobalamin 1000 MCG tablet Take 100 mcg by mouth daily.    [provider]  glucose blood (ONETOUCH VERIO) test strip Test sugars twice daily 04/26/14   Phadke, Radhika P, MD  lactulose (CHRONULAC) 10 GM/15ML solution Take 45 mLs (30 g total) by mouth daily as needed for mild constipation. 10/20/19   [name], MD  metFORMIN (GLUCOPHAGE) 500 MG tablet Take 1 tablet (500 mg total) by mouth 2 (two) times daily with a meal. 10/04/19   Nida, Marella Chimes, MD  [facility] DELICA LANCETS 81O MISC Test sugars twice daily 04/26/14   Phadke, Radhika P, MD  pantoprazole (PROTONIX) 40 MG tablet TAKE 1 TABLET(40 MG) BY MOUTH DAILY 01/09/20   [name], MD  Plecanatide (TRULANCE) 3 MG  TABS Take 3 mg by mouth daily. 11/02/18   [name], MD  rosuvastatin (CRESTOR) 40 MG tablet Take 1 tablet (40 mg total) by mouth at bedtime. 10/22/19   [name], MD    Allergies    Patient has no known allergies.  Review of Systems   Review of Systems  All other systems reviewed and are negative. Ten systems reviewed and are negative for acute change, except as noted in the HPI.   Physical Exam Updated Vital Signs BP (!) 171/86 (BP Location: Left Arm)   Pulse 70   Temp 97.8 F (36.6 C) (Oral)   Resp 16   Ht 6\' 1"  (1.854 m)   Wt 97.5 kg   SpO2 100%   BMI 28.37 kg/m   Physical Exam Vitals and nursing note reviewed.  Constitutional:      General: He is not in acute distress.    Appearance: Normal appearance. He is well-developed and normal weight.  He is not ill-appearing, toxic-appearing or diaphoretic.  HENT:     Head: Normocephalic and atraumatic.     Right Ear: External ear normal.     Left Ear: External ear normal.     Nose: Nose normal.     Mouth/Throat:     Mouth: Mucous membranes are moist.     Pharynx: Oropharynx is clear. No oropharyngeal exudate or posterior oropharyngeal erythema.  Eyes:     Extraocular Movements: Extraocular movements intact.  Cardiovascular:     Rate and Rhythm: Normal rate and regular rhythm.     Pulses: Normal pulses.          Radial pulses are 2+ on the right side and 2+ on the left side.       Dorsalis pedis pulses are 2+ on the right side and 2+ on the left side.     Heart sounds: Normal heart sounds. Heart sounds not distant. No murmur heard.  No systolic murmur is present.  No diastolic murmur is present. No friction rub. No gallop.   Pulmonary:     Effort: Pulmonary effort is normal. No tachypnea, accessory muscle usage or respiratory distress.     Breath sounds: Normal breath sounds. No stridor. No decreased breath sounds, wheezing, rhonchi or rales.  Chest:     Chest wall: Tenderness present. No crepitus.      Comments: Tenderness appreciated diffusely along the left lateral anterior chest wall as well as the left ribs. Abdominal:     General: Abdomen is flat.     Tenderness: There is no abdominal tenderness.  Musculoskeletal:        General: Normal range of motion.     Cervical back: Normal range of motion and neck supple. No tenderness.     Right lower leg: No tenderness. No edema.     Left lower leg: No tenderness. No edema.     Comments: No leg swelling.  No calf tenderness.  Skin:    General: Skin is warm and dry.  Neurological:     General: No focal deficit present.     Mental Status: He is alert and oriented to person, place, and time.  Psychiatric:        Mood and Affect: Mood normal.        Behavior: Behavior normal.     ED Results / Procedures / Treatments   Labs (all labs ordered are listed, but only abnormal results are displayed) Labs Reviewed  COMPREHENSIVE METABOLIC PANEL - Abnormal; Notable for the following components:      Result Value   Potassium 3.1 (*)    Glucose, Bld 142 (*)    Calcium 8.8 (*)    All other components within normal limits  CBC WITH DIFFERENTIAL/PLATELET - Abnormal; Notable for the following components:   HCT 37.1 (*)    All other components within normal limits  TROPONIN I (HIGH SENSITIVITY)   EKG EKG Interpretation  Date/Time: 03/24/2020   Ventricular Rate: 81   PR Interval:    QRS Duration: 88   QT Interval: 364    QTC Calculation: 423      Text Interpretation: Sinus rhythm; borderline prolonged PR; probable left atrial enlargement; borderline T wave abnormalities.    Radiology DG Ribs Unilateral W/Chest Left  Result Date: 03/24/2020 CLINICAL DATA:  Left rib pain EXAM: LEFT RIBS AND CHEST - 3+ VIEW COMPARISON:  None. FINDINGS: No fracture or other bone lesions are seen involving the ribs. There is no evidence of pneumothorax  or pleural effusion. Both lungs are clear. Heart size and mediastinal contours are within normal limits.  IMPRESSION: Negative. Electronically Signed   By: Fidela Salisbury MD   On: 03/24/2020 22:39   Procedures Procedures   Medications Ordered in ED Medications  ketorolac (TORADOL) 30 MG/ML injection 30 mg (30 mg Intravenous Given 03/24/20 2146)  potassium chloride SA (KLOR-CON) CR tablet 40 mEq (40 mEq Oral Given 03/24/20 2227)  HYDROcodone-acetaminophen (NORCO/VICODIN) 5-325 MG per tablet 1 tablet (1 tablet Oral Given 03/24/20 2313)   ED Course  I have reviewed the triage vital signs and the nursing notes.  Pertinent labs & imaging results that were available during my care of the patient were reviewed by me and considered in my medical decision making (see chart for details).    MDM Rules/Calculators/A&P                          Pt is a 50 y.o. male who presents the emergency department with a left-sided chest pain as well as left rib pain.  No history of trauma.  Reproducible on my exam.  Labs: CBC with hematocrit of 37.1 CMP with potassium of 3.1 which was repleted with Klor-Con.  A glucose of 142 and a calcium of 8.8. Troponin of 4.  Imaging: X-rays of the left ribs and chest were negative.   I, Rayna Sexton, PA-C, personally reviewed and evaluated these images and lab results as part of my medical decision-making.  Patient evaluated again today for left-sided chest and rib pain.  It is reproducible and seems to be worst diffusely along the left ribs.  He was initially evaluated for this 4 days ago and had 2 - troponins.  Reassuring ECG.  His troponin today is 4.  ECG once again reassuring. No leg swelling, cough, hemoptysis, shortness of breath/hypoxia, tachycardia.  Doubt PE at this time.  He was mildly hypokalemic and was given Klor-Con once again for this.  Will discharge on a short course of a potassium supplement.  Recommended that he follow-up with his PCP to have this rechecked in 2 weeks.    Per records, patient was diagnosed with pleurisy in 2018 after having a reassuring  cardiac workup as well as a CTA of the chest.  States his pain today is very similar to that prior pain.    Kidney function is normal today.  Will discharge on a course of indomethacin.  We discussed return precautions in length.  He knows if his symptoms do not improve or begin to worsen, needs to return to the emergency department.  He also understands if he develops any shortness of breath, diaphoresis, nausea, vomiting, swelling in his legs, these are all reasons to return to the emergency department for reevaluation.  Otherwise, recommended PCP follow-up.  His questions were answered and he was amicable at the time of discharge.  Note: Portions of this report may have been transcribed using voice recognition software. Every effort was made to ensure accuracy; however, inadvertent computerized transcription errors may be present.    Final Clinical Impression(s) / ED Diagnoses Final diagnoses:  Atypical chest pain    Rx / DC Orders ED Discharge Orders         Ordered    indomethacin (INDOCIN) 50 MG capsule  2 times daily with meals        03/24/20 2231    potassium chloride (KLOR-CON) 10 MEQ tablet  Daily  03/24/20 2231           Rayna Sexton, PA-C 03/25/20 0841    Hayden Rasmussen, MD 03/25/20 1010

## 2020-03-24 NOTE — ED Notes (Signed)
ED Provider at bedside. 

## 2020-03-24 NOTE — Discharge Instructions (Addendum)
Like we discussed, I have prescribed you 2 medications.  The first medication is for your left-sided chest pain.  If this is in fact pleurisy, anti-inflammatories should help treat your symptoms.  I am prescribing you indomethacin which you can take twice per day for the next 2 weeks.  Please take this as prescribed.  Please try to take this with a small amount of food, as it can cause stomach irritation.  The second medication is called potassium.  During your last 2 visits your potassium has been low.  I would like you to take a once daily supplement for the next 2 weeks and follow-up with your regular doctor to have your potassium rechecked in 2 to 3 weeks.  If you develop new or worsening symptoms, please return to the emergency department for reevaluation.  Otherwise, please follow-up with your regular doctor.  It was a pleasure to meet you.

## 2020-05-13 ENCOUNTER — Ambulatory Visit (INDEPENDENT_AMBULATORY_CARE_PROVIDER_SITE_OTHER): Payer: BC Managed Care – PPO | Admitting: Gastroenterology

## 2020-05-13 ENCOUNTER — Encounter: Payer: Self-pay | Admitting: Gastroenterology

## 2020-05-13 ENCOUNTER — Other Ambulatory Visit: Payer: Self-pay

## 2020-05-13 VITALS — BP 148/83 | HR 88 | Temp 97.4°F | Ht 73.0 in | Wt 196.4 lb

## 2020-05-13 DIAGNOSIS — R1084 Generalized abdominal pain: Secondary | ICD-10-CM | POA: Diagnosis not present

## 2020-05-13 NOTE — Progress Notes (Signed)
Primary Care Physician: [name], MD  Primary Gastroenterologist:  Dr. Lucilla Lame  Chief Complaint  Patient presents with  . New Patient (Initial Visit)    Abdominal pain    HPI: Jacob Baker is a 50 y.o. male here with a history of 2 recent visits to the emergency department for chest pain that was on 23 February and 27 February.  The patient's diagnosis after a work-up was that the patient was having musculoskeletal pain that was reproducible to palpation.  The patient's cardiac work-up was negative.  The patient has seen me in the past for change in bowel habits with a feeling of incomplete evacuation.  The patient has had multiple upper endoscopies and colonoscopies in the past.  The patient has also had a history of H. pylori for which she was treated. The patient reports that he is sometimes constipated and will only move his bowels every few days to once a week.  He also reports that he was started on Linzess but it was not covered by his insurance so therefore he did not take it on a regular basis.  He does report that it did work.  There is no report of any unexplained weight loss fevers chills nausea or vomiting.  The patient's abdominal pain is not related to movement nor is it related to his bowel movements.   Past Medical History:  Diagnosis Date  . CHF (congestive heart failure) ([place])   . Diabetes mellitus without complication ([place])   . DVT (deep venous thrombosis) ([place]) 04/2017  . GERD (gastroesophageal reflux disease)   . History of migraine    none in over 2 yrs  . Hyperlipidemia   . Hypertension   . Syncope and collapse    x1 - approx 2-3 yrs ago - dehydration    Current Outpatient Medications  Medication Sig Dispense Refill  . albuterol (VENTOLIN HFA) 108 (90 Base) MCG/ACT inhaler Inhale 2 puffs into the lungs every 6 (six) hours as needed for wheezing or shortness of breath. 8 g 1  . amLODipine (NORVASC) 10 MG tablet Take 1 tablet (10 mg total) by  mouth daily. 90 tablet 1  . cyanocobalamin 1000 MCG tablet Take 100 mcg by mouth daily.    Marland Kitchen glucose blood (ONETOUCH VERIO) test strip Test sugars twice daily 50 each 6  . metFORMIN (GLUCOPHAGE) 500 MG tablet Take 1 tablet (500 mg total) by mouth 2 (two) times daily with a meal. 180 tablet 3  . ONETOUCH DELICA LANCETS 98X MISC Test sugars twice daily 50 each 3  . pantoprazole (PROTONIX) 40 MG tablet TAKE 1 TABLET(40 MG) BY MOUTH DAILY 90 tablet 1  . potassium chloride (KLOR-CON) 10 MEQ tablet Take 1 tablet (10 mEq total) by mouth daily. 14 tablet 0  . rosuvastatin (CRESTOR) 40 MG tablet Take 1 tablet (40 mg total) by mouth at bedtime. 90 tablet 1  . indomethacin (INDOCIN) 50 MG capsule Take 1 capsule (50 mg total) by mouth 2 (two) times daily with a meal. (Patient not taking: Reported on 05/13/2020) 30 capsule 0  . lactulose (CHRONULAC) 10 GM/15ML solution Take 45 mLs (30 g total) by mouth daily as needed for mild constipation. (Patient not taking: Reported on 05/13/2020) 236 mL 0  . Plecanatide (TRULANCE) 3 MG TABS Take 3 mg by mouth daily. (Patient not taking: Reported on 05/13/2020) 30 tablet 5   No current facility-administered medications for this visit.    Allergies as of 05/13/2020  . (  No Known Allergies)    ROS:  General: Negative for anorexia, weight loss, fever, chills, fatigue, weakness. ENT: Negative for hoarseness, difficulty swallowing , nasal congestion. CV: Negative for chest pain, angina, palpitations, dyspnea on exertion, peripheral edema.  Respiratory: Negative for dyspnea at rest, dyspnea on exertion, cough, sputum, wheezing.  GI: See history of present illness. GU:  Negative for dysuria, hematuria, urinary incontinence, urinary frequency, nocturnal urination.  Endo: Negative for unusual weight change.    Physical Examination:   BP (!) 148/83   Pulse 88   Temp (!) 97.4 F (36.3 C) (Temporal)   Ht 6\' 1"  (1.854 m)   Wt 196 lb 6.4 oz (89.1 kg)   BMI 25.91 kg/m    General: Well-nourished, well-developed in no acute distress.  Eyes: No icterus. Conjunctivae pink. Lungs: Clear to auscultation bilaterally. Non-labored. Heart: Regular rate and rhythm, no murmurs rubs or gallops.  Abdomen: Bowel sounds are normal, nontender, nondistended, no hepatosplenomegaly or masses, no abdominal bruits or hernia , no rebound or guarding.   Extremities: No lower extremity edema. No clubbing or deformities. Neuro: Alert and oriented x 3.  Grossly intact. Skin: Warm and dry, no jaundice.   Psych: Alert and cooperative, normal mood and affect.  Labs:    Imaging Studies: No results found.  Assessment and Plan:   Jacob Baker is a 49 y.o. y/o male who comes in today with a history of chronic left-sided abdominal pain with a colonoscopy not showing the cause of his abdominal discomfort.  The patient will be set up for a CT scan of the abdomen and pelvis to look for a cause of his chronic constipation and his abdominal pain to rule out any sort of chronic inflammatory processes going on in the abdomen versus possible kidney stones versus chronic diverticulitis.  The patient will also be given samples of Trulance to see if this helps his bowel movements and if it does we can try to give him a prescription for it.  The patient has been explained the plan agrees with it.     Lucilla Lame, MD. Marval Regal    Note: This dictation was prepared with Dragon dictation along with smaller phrase technology. Any transcriptional errors that result from this process are unintentional.

## 2020-05-20 ENCOUNTER — Other Ambulatory Visit: Payer: Self-pay

## 2020-05-20 MED ORDER — TRULANCE 3 MG PO TABS: 3.0000 mg | ORAL_TABLET | Freq: Every day | ORAL | 5 refills | Status: DC

## 2020-05-22 ENCOUNTER — Other Ambulatory Visit: Payer: Self-pay

## 2020-05-22 ENCOUNTER — Encounter: Payer: Self-pay | Admitting: "Endocrinology

## 2020-05-22 ENCOUNTER — Ambulatory Visit (INDEPENDENT_AMBULATORY_CARE_PROVIDER_SITE_OTHER): Payer: BC Managed Care – PPO | Admitting: "Endocrinology

## 2020-05-22 VITALS — BP 114/66 | HR 76 | Ht 73.0 in | Wt 202.2 lb

## 2020-05-22 DIAGNOSIS — E782 Mixed hyperlipidemia: Secondary | ICD-10-CM

## 2020-05-22 DIAGNOSIS — F17200 Nicotine dependence, unspecified, uncomplicated: Secondary | ICD-10-CM

## 2020-05-22 DIAGNOSIS — E1165 Type 2 diabetes mellitus with hyperglycemia: Secondary | ICD-10-CM

## 2020-05-22 LAB — POCT GLYCOSYLATED HEMOGLOBIN (HGB A1C): HbA1c, POC (controlled diabetic range): 8 % — AB (ref 0.0–7.0)

## 2020-05-22 LAB — POCT UA - MICROALBUMIN
Albumin/Creatinine Ratio, Urine, POC: <30
Creatinine, POC: 300 mg/dL
Microalbumin Ur, POC: 30 mg/L

## 2020-05-22 MED ORDER — GLIPIZIDE ER 5 MG PO TB24: 5.0000 mg | ORAL_TABLET | Freq: Every day | ORAL | 1 refills | Status: DC

## 2020-05-22 NOTE — Progress Notes (Signed)
05/22/2020, 4:53 PM   Endocrinology follow-up note  Subjective:    Patient ID: ZYQUAN RIZK, male    DOB: 11/25/1970.  [name] is being seen in follow-up after he was seen in consultation for management of currently uncontrolled symptomatic diabetes requested by  [name], MD.   Past Medical History:  Diagnosis Date  . CHF (congestive heart failure) ([place])   . Diabetes mellitus without complication ([place])   . DVT (deep venous thrombosis) ([place]) 04/2017  . GERD (gastroesophageal reflux disease)   . History of migraine    none in over 2 yrs  . Hyperlipidemia   . Hypertension   . Syncope and collapse    x1 - approx 2-3 yrs ago - dehydration    Past Surgical History:  Procedure Laterality Date  . COLONOSCOPY    . COLONOSCOPY WITH PROPOFOL N/A 08/06/2017   Procedure: COLONOSCOPY WITH PROPOFOL;  Surgeon: Lucilla Lame, MD;  Location: [facility];  Service: Endoscopy;  Laterality: N/A;  Diabetic - oral meds  . ESOPHAGEAL DILATION  09/24/2016   Procedure: ESOPHAGEAL DILATION;  Surgeon: Lucilla Lame, MD;  Location: [place];  Service: Gastroenterology;;  . ESOPHAGOGASTRODUODENOSCOPY (EGD) WITH PROPOFOL N/A 04/08/2015   Procedure: ESOPHAGOGASTRODUODENOSCOPY (EGD) WITH PROPOFOL with dialation;  Surgeon: Lucilla Lame, MD;  Location: [facility];  Service: Endoscopy;  Laterality: N/A;  diabetic - oral meds  . ESOPHAGOGASTRODUODENOSCOPY (EGD) WITH PROPOFOL N/A 09/24/2016   Procedure: ESOPHAGOGASTRODUODENOSCOPY (EGD) WITH PROPOFOL WITH DILATION;  Surgeon: Lucilla Lame, MD;  Location: [facility];  Service: Gastroenterology;  Laterality: N/A;  Diabetic - oral meds  . FINGER AMPUTATION  2009   partial removal left index finger   . POLYPECTOMY  08/06/2017   Procedure: POLYPECTOMY INTESTINAL;  Surgeon: Lucilla Lame, MD;  Location: [facility];  Service: Endoscopy;;     Social History   Socioeconomic History  . Marital status: Married    Spouse name: Not on file  . Number of children: Not on file  . Years of education: Not on file  . Highest education level: Not on file  Occupational History  . Not on file  Tobacco Use  . Smoking status: Current Every Day Smoker    Packs/day: 2.00    Years: 27.00    Pack years: 54.00    Types: Cigarettes  . Smokeless tobacco: Never Used  Vaping Use  . Vaping Use: Never used  Substance and Sexual Activity  . Alcohol use: No  . Drug use: No  . Sexual activity: Not on file  Other Topics Concern  . Not on file  Social History Narrative  . Not on file   Social Determinants of Health   Financial Resource Strain: Not on file  Food Insecurity: Not on file  Transportation Needs: Not on file  Physical Activity: Not on file  Stress: Not on file  Social Connections: Not on file    Family History  Problem Relation Age of Onset  . Heart disease Father        CAD  . Hypertension Father   . Diabetes Father   . Hypertension Sister   .  Hypertension Brother   . Hypertension Brother   . Hypertension Mother     Outpatient Encounter Medications as of 05/22/2020  Medication Sig  . glipiZIDE (GLUCOTROL XL) 5 MG 24 hr tablet Take 1 tablet (5 mg total) by mouth daily with breakfast.  . albuterol (VENTOLIN HFA) 108 (90 Base) MCG/ACT inhaler Inhale 2 puffs into the lungs every 6 (six) hours as needed for wheezing or shortness of breath.  Marland Kitchen amLODipine (NORVASC) 10 MG tablet Take 1 tablet (10 mg total) by mouth daily.  . cyanocobalamin 1000 MCG tablet Take 100 mcg by mouth daily.  Marland Kitchen glucose blood (ONETOUCH VERIO) test strip Test sugars twice daily  . metFORMIN (GLUCOPHAGE) 500 MG tablet Take 1 tablet (500 mg total) by mouth 2 (two) times daily with a meal.  . ONETOUCH DELICA LANCETS 99991111 MISC Test sugars twice daily  . pantoprazole (PROTONIX) 40 MG tablet TAKE 1 TABLET(40 MG) BY MOUTH DAILY  . Plecanatide  (TRULANCE) 3 MG TABS Take 3 mg by mouth daily.  . potassium chloride (KLOR-CON) 10 MEQ tablet Take 1 tablet (10 mEq total) by mouth daily. (Patient not taking: Reported on 05/22/2020)  . rosuvastatin (CRESTOR) 40 MG tablet Take 1 tablet (40 mg total) by mouth at bedtime.  . [DISCONTINUED] indomethacin (INDOCIN) 50 MG capsule Take 1 capsule (50 mg total) by mouth 2 (two) times daily with a meal. (Patient not taking: Reported on 05/13/2020)  . [DISCONTINUED] lactulose (CHRONULAC) 10 GM/15ML solution Take 45 mLs (30 g total) by mouth daily as needed for mild constipation. (Patient not taking: Reported on 05/13/2020)   No facility-administered encounter medications on file as of 05/22/2020.    ALLERGIES: No Known Allergies  VACCINATION STATUS: Immunization History  Administered Date(s) Administered  . Influenza Split 12/15/2013  . Influenza,inj,Quad PF,6+ Mos 10/26/2014, 10/14/2015, 10/13/2017, 10/18/2018, 10/20/2019  . Influenza-Unspecified 09/26/2016  . Moderna Sars-Covid-2 Vaccination 04/28/2019, 05/30/2019  . Pneumococcal Polysaccharide-23 10/14/2015  . Tdap 10/26/2014    Diabetes He presents for his follow-up diabetic visit. He has type 2 diabetes mellitus. His disease course has been worsening. There are no hypoglycemic associated symptoms. Pertinent negatives for hypoglycemia include no confusion, headaches, pallor or seizures. Associated symptoms include polydipsia and polyuria. Pertinent negatives for diabetes include no chest pain, no fatigue, no polyphagia and no weakness. Symptoms are worsening. There are no diabetic complications. Risk factors for coronary artery disease include dyslipidemia, diabetes mellitus, male sex, family history and tobacco exposure. Current diabetic treatment includes oral agent (dual therapy). His weight is fluctuating minimally. He is following a generally unhealthy diet. When asked about meal planning, he reported none. He has not had a previous visit with a  dietitian. He rarely participates in exercise. His home blood glucose trend is increasing steadily. (He presents with loss of control with A1c of 8% increasing from 6.4%.  He did not bring any logs nor meter.  He has not been monitoring regularly.  )  Hyperlipidemia This is a chronic problem. The current episode started more than 1 year ago. The problem is uncontrolled. Exacerbating diseases include diabetes. Pertinent negatives include no chest pain, myalgias or shortness of breath. Current antihyperlipidemic treatment includes statins. Risk factors for coronary artery disease include diabetes mellitus, dyslipidemia, male sex and family history.  Hypertension This is a chronic problem. The current episode started more than 1 year ago. The problem is controlled. Pertinent negatives include no chest pain, headaches, neck pain, palpitations or shortness of breath. Risk factors for coronary artery disease include family  history, dyslipidemia, diabetes mellitus, male gender and smoking/tobacco exposure. Past treatments include calcium channel blockers.     Review of Systems  Constitutional: Negative for chills, fatigue, fever and unexpected weight change.  HENT: Negative for dental problem, mouth sores and trouble swallowing.   Eyes: Negative for visual disturbance.  Respiratory: Negative for cough, choking, chest tightness, shortness of breath and wheezing.   Cardiovascular: Negative for chest pain, palpitations and leg swelling.  Gastrointestinal: Negative for abdominal distention, abdominal pain, constipation, diarrhea, nausea and vomiting.  Endocrine: Positive for polydipsia and polyuria. Negative for polyphagia.  Genitourinary: Negative for dysuria, flank pain, hematuria and urgency.  Musculoskeletal: Negative for back pain, gait problem, myalgias and neck pain.  Skin: Negative for pallor, rash and wound.  Neurological: Negative for seizures, syncope, weakness, numbness and headaches.   Psychiatric/Behavioral: Negative for confusion and dysphoric mood.    Objective:    BP 114/66   Pulse 76   Ht 6\' 1"  (1.854 m)   Wt 202 lb 3.2 oz (91.7 kg)   BMI 26.68 kg/m   Wt Readings from Last 3 Encounters:  05/22/20 202 lb 3.2 oz (91.7 kg)  05/13/20 196 lb 6.4 oz (89.1 kg)  03/24/20 215 lb (97.5 kg)     Physical Exam Constitutional:      General: He is not in acute distress.    Appearance: He is well-developed.  HENT:     Head: Normocephalic and atraumatic.  Neck:     Thyroid: No thyromegaly.     Trachea: No tracheal deviation.  Cardiovascular:     Rate and Rhythm: Normal rate.     Pulses:          Dorsalis pedis pulses are 1+ on the right side and 1+ on the left side.       Posterior tibial pulses are 1+ on the right side and 1+ on the left side.     Heart sounds: S1 normal and S2 normal. No murmur heard. No gallop.   Pulmonary:     Effort: Pulmonary effort is normal. No respiratory distress.     Breath sounds: No wheezing.  Abdominal:     General: There is no distension.     Tenderness: There is no abdominal tenderness. There is no guarding.  Musculoskeletal:     Right shoulder: No swelling or deformity.     Cervical back: Normal range of motion and neck supple.  Skin:    General: Skin is warm and dry.     Findings: No rash.     Nails: There is no clubbing.  Neurological:     Mental Status: He is alert and oriented to person, place, and time.     Cranial Nerves: No cranial nerve deficit.     Sensory: No sensory deficit.     Gait: Gait normal.     Deep Tendon Reflexes: Reflexes are normal and symmetric.  Psychiatric:        Speech: Speech normal.        Behavior: Behavior normal. Behavior is cooperative.        Thought Content: Thought content normal.        Judgment: Judgment normal.       CMP ( most recent) CMP     Component Value Date/Time   NA 140 03/24/2020 2141   NA 139 04/23/2014 1040   NA 138 10/04/2013 2025   K 3.1 (L) 03/24/2020  2141   K 3.0 (L) 10/04/2013 2025   CL 106 03/24/2020 2141  CL 104 10/04/2013 2025   CO2 24 03/24/2020 2141   CO2 25 10/04/2013 2025   GLUCOSE 142 (H) 03/24/2020 2141   GLUCOSE 175 (H) 10/04/2013 2025   BUN 12 03/24/2020 2141   BUN 15 04/23/2014 1040   BUN 13 10/04/2013 2025   CREATININE 0.82 03/24/2020 2141   CREATININE 1.04 11/16/2019 1505   CALCIUM 8.8 (L) 03/24/2020 2141   CALCIUM 9.2 10/04/2013 2025   PROT 7.1 03/24/2020 2141   PROT 7.4 10/04/2013 2025   ALBUMIN 4.3 03/24/2020 2141   ALBUMIN 4.1 10/04/2013 2025   AST 15 03/24/2020 2141   AST 8 (L) 10/04/2013 2025   ALT 27 03/24/2020 2141   ALT 19 10/04/2013 2025   ALKPHOS 98 03/24/2020 2141   ALKPHOS 114 10/04/2013 2025   BILITOT 0.5 03/24/2020 2141   BILITOT 0.4 10/04/2013 2025   GFRNONAA >60 03/24/2020 2141   GFRNONAA 83 11/16/2019 1505   GFRAA 96 11/16/2019 1505     Diabetic Labs (most recent): Lab Results  Component Value Date   HGBA1C 8.0 (A) 05/22/2020   HGBA1C 5.9 (H) 10/20/2019   HGBA1C 6.4 (A) 07/20/2019     Lipid Panel ( most recent) Lipid Panel     Component Value Date/Time   CHOL 186 11/16/2019 1505   CHOL 180 03/23/2013 0422   TRIG 175 (H) 11/16/2019 1505   TRIG 218 (H) 03/23/2013 0422   HDL 30 (L) 11/16/2019 1505   HDL 21 (L) 03/23/2013 0422   CHOLHDL 6.2 (H) 11/16/2019 1505   VLDL 53.8 (H) 10/18/2018 1406   VLDL 44 (H) 03/23/2013 0422   LDLCALC 127 (H) 11/16/2019 1505   LDLCALC 115 (H) 03/23/2013 0422   LDLDIRECT 176.0 10/18/2018 1406      Lab Results  Component Value Date   TSH 1.32 11/16/2019   TSH 1.38 10/18/2018   TSH 1.07 06/11/2016   TSH 1.66 03/12/2014   TSH 1.140 11/24/2013   TSH 1.42 02/25/2012   FREET4 1.2 11/16/2019   FREET4 1.00 04/23/2014      Assessment & Plan:   1. Uncontrolled type 2 diabetes mellitus with hyperglycemia (HCC)  - [name] has currently uncontrolled symptomatic type 2 DM since  50 years of age.  He presents with loss of control with  A1c of 8% increasing from 6.4%.  He did not bring any logs nor meter.  He has not been monitoring regularly.     Recent labs reviewed, showing normal renal function. - I had a long discussion with him about the progressive nature of diabetes and the pathology behind its complications. -his diabetes is complicated by smoking and he remains at a high risk for more acute and chronic complications which include CAD, CVA, CKD, retinopathy, and neuropathy. These are all discussed in detail with him.  - I have counseled him on diet  and weight management  by adopting a carbohydrate restricted/protein rich diet. Patient is encouraged to switch to  unprocessed or minimally processed     complex starch and increased protein intake (animal or plant source), fruits, and vegetables. -  he is advised to stick to a routine mealtimes to eat 3 meals  a day and avoid unnecessary snacks ( to snack only to correct hypoglycemia).   - he acknowledges that there is a room for improvement in his food and drink choices. - Suggestion is made for him to avoid simple carbohydrates  from his diet including Cakes, Sweet Desserts, Ice Cream, Soda (diet and regular), Sweet  Tea, Candies, Chips, Cookies, Store Bought Juices, Alcohol in Excess of  1-2 drinks a day, Artificial Sweeteners,  Coffee Creamer, and "Sugar-free" Products, Lemonade. This will help patient to have more stable blood glucose profile and potentially avoid unintended weight gain.   - he will be scheduled with Jearld Fenton, RDN, CDE for diabetes education.  - I have approached him with the following individualized plan to manage  his diabetes and patient agrees:   -Based on his current glycemic response, he will need more treatment.  I discussed and added glipizide 5 mg XL p.o. daily at breakfast along with his metformin 500 mg p.o. twice daily.   -He is approached and willing to start monitoring blood glucose at least once a day-daily before breakfast and  report if fasting blood glucose is greater than 200 or less than 70.    - Specific targets for  A1c;  LDL, HDL, Triglycerides, and  Waist Circumference were discussed with the patient.  2) Blood Pressure /Hypertension: -His blood pressure is controlled to target.  He is advised to continue amlodipine 10 mg p.o. daily at breakfast.   3) Lipids/Hyperlipidemia:   Review of his recent repeat lipid panel showed slight improvement in his LDL to 127 from 167.  This could be response to his Crestor 40 mg p.o. nightly.  He is advised to continue.  4)  Weight/Diet:  Body mass index is 26.68 kg/m.  -    he is not a candidate for major weight loss. I discussed with him the fact that loss of 5 - 10% of his  current body weight will have the most impact on his diabetes management.  Exercise, and detailed carbohydrates information provided  -  detailed on discharge instructions.  5) Chronic Care/Health Maintenance:  -he  is on  Statin medications and  is encouraged to initiate and continue to follow up with Ophthalmology, Dentist,  Podiatrist at least yearly or according to recommendations, and advised to  quit smoking.  The patient was counseled on the dangers of tobacco use, and was advised to quit.  Reviewed strategies to maximize success, including removing cigarettes and smoking materials from environment.  -  I have recommended yearly flu vaccine and pneumonia vaccine at least every 5 years; moderate intensity exercise for up to 150 minutes weekly; and  sleep for at least 7 hours a day.  - he is  advised to maintain close follow up with [name], MD for primary care needs, as well as his other providers for optimal and coordinated care.    I spent 32 minutes in the care of the patient today including review of labs from Thyroid Function, CMP, and other relevant labs ; imaging/biopsy records (current and previous including abstractions from other facilities); face-to-face time discussing  his  lab results and symptoms, medications doses, his options of short and long term treatment based on the latest standards of care / guidelines;   and documenting the encounter.  [name]  participated in the discussions, expressed understanding, and voiced agreement with the above plans.  All questions were answered to his satisfaction. he is encouraged to contact clinic should he have any questions or concerns prior to his return visit.   Follow up plan: - Return in about 4 months (around 09/21/2020) for Bring Meter and Logs- A1c in Office.  Glade Lloyd, MD [facility] [address] [place], [place] [address] Phone: [phone]  Fax: [phone]  05/22/2020, 4:53 PM  This note was partially dictated with voice recognition software. Similar sounding words can be transcribed inadequately or may not  be corrected upon review.

## 2020-05-22 NOTE — Patient Instructions (Signed)
                                     Advice for Weight Management  -For most of us the best way to lose weight is by diet management. Generally speaking, diet management means consuming less calories intentionally which over time brings about progressive weight loss.  This can be achieved more effectively by restricting carbohydrate consumption to the minimum possible.  So, it is critically important to know your numbers: how much calorie you are consuming and how much calorie you need. More importantly, our carbohydrates sources should be unprocessed or minimally processed complex starch food items.   Sometimes, it is important to balance nutrition by increasing protein intake (animal or plant source), fruits, and vegetables.  -Sticking to a routine mealtime to eat 3 meals a day and avoiding unnecessary snacks is shown to have a big role in weight control. Under normal circumstances, the only time we lose real weight is when we are hungry, so allow hunger to take place- hunger means no food between meal times, only water.  It is not advisable to starve.   -It is better to avoid simple carbohydrates including: Cakes, Sweet Desserts, Ice Cream, Soda (diet and regular), Sweet Tea, Candies, Chips, Cookies, Store Bought Juices, Alcohol in Excess of  1-2 drinks a day, Lemonade,  Artificial Sweeteners, Doughnuts, Coffee Creamers, "Sugar-free" Products, etc, etc.  This is not a complete list.....    -Consulting with certified diabetes educators is proven to provide you with the most accurate and current information on diet.  Also, you may be  interested in discussing diet options/exchanges , we can schedule a visit with Jacob Baker, RDN, CDE for individualized nutrition education.  -Exercise: If you are able: 30 -60 minutes a day ,4 days a week, or 150 minutes a week.  The longer the better.  Combine stretch, strength, and aerobic activities.  If you were told in the  past that you have high risk for cardiovascular diseases, you may seek evaluation by your heart doctor prior to initiating moderate to intense exercise programs.                                  Additional Care Considerations for Diabetes   -Diabetes  is a chronic disease.  The most important care consideration is regular follow-up with your diabetes care provider with the goal being avoiding or delaying its complications and to take advantage of advances in medications and technology.    -Type 2 diabetes is known to coexist with other important comorbidities such as high blood pressure and high cholesterol.  It is critical to control not only the diabetes but also the high blood pressure and high cholesterol to minimize and delay the risk of complications including coronary artery disease, stroke, amputations, blindness, etc.    - Studies showed that people with diabetes will benefit from a class of medications known as ACE inhibitors and statins.  Unless there are specific reasons not to be on these medications, the standard of care is to consider getting one from these groups of medications at an optimal doses.  These medications are generally considered safe and proven to help protect the heart and the kidneys.    - People with diabetes are encouraged to initiate and maintain regular follow-up with eye doctors, foot   doctors, dentists , and if necessary heart and kidney doctors.     - It is highly recommended that people with diabetes quit smoking or stay away from smoking, and get yearly  flu vaccine and pneumonia vaccine at least every 5 years.  One other important lifestyle recommendation is to ensure adequate sleep - at least 6-7 hours of uninterrupted sleep at night.  -Exercise: If you are able: 30 -60 minutes a day, 4 days a week, or 150 minutes a week.  The longer the better.  Combine stretch, strength, and aerobic activities.  If you were told in the past that you have high risk for  cardiovascular diseases, you may seek evaluation by your heart doctor prior to initiating moderate to intense exercise programs.          

## 2020-05-29 ENCOUNTER — Encounter: Payer: Self-pay | Admitting: Internal Medicine

## 2020-05-29 ENCOUNTER — Telehealth (INDEPENDENT_AMBULATORY_CARE_PROVIDER_SITE_OTHER): Payer: BC Managed Care – PPO | Admitting: Internal Medicine

## 2020-05-29 ENCOUNTER — Ambulatory Visit
Admission: RE | Admit: 2020-05-29 | Discharge: 2020-05-29 | Disposition: A | Discharge status: Home / Self Care | Payer: BC Managed Care – PPO | Source: Ambulatory Visit | Attending: Gastroenterology | Admitting: Gastroenterology

## 2020-05-29 ENCOUNTER — Other Ambulatory Visit: Payer: Self-pay

## 2020-05-29 DIAGNOSIS — J22 Unspecified acute lower respiratory infection: Secondary | ICD-10-CM | POA: Diagnosis not present

## 2020-05-29 DIAGNOSIS — R1084 Generalized abdominal pain: Secondary | ICD-10-CM

## 2020-05-29 DIAGNOSIS — R109 Unspecified abdominal pain: Secondary | ICD-10-CM | POA: Diagnosis not present

## 2020-05-29 LAB — POCT I-STAT CREATININE: Creatinine, Ser: 0.9 mg/dL (ref 0.61–1.24)

## 2020-05-29 MED ORDER — IOHEXOL 300 MG/ML  SOLN
100.0000 mL | Freq: Once | INTRAMUSCULAR | Status: AC | PRN
  Administered 2020-05-29: 100 mL via INTRAVENOUS

## 2020-05-29 MED ORDER — AZITHROMYCIN 500 MG PO TABS: 500.0000 mg | ORAL_TABLET | Freq: Every day | ORAL | 0 refills | Status: DC

## 2020-05-29 MED ORDER — CHERATUSSIN AC 100-10 MG/5ML PO SOLN: 10.0000 mL | Freq: Every evening | ORAL | 0 refills | Status: DC | PRN

## 2020-05-29 MED ORDER — PREDNISONE 10 MG PO TABS: ORAL_TABLET | ORAL | 0 refills | Status: DC

## 2020-05-29 NOTE — Patient Instructions (Signed)
I am treating you for an atypical pneumonia since you are coughing and having pleurisy  I have sent azithromycin, an antibiotic to take once daily for 7 days, along with a 6 day prednisone taper ,  To the [facility] in [place]  I have also sent a cough suppressant that contains codeine to take 30 minutes before bedtime   If the chest pain and cough do not resolve by the end of 2 weeks, please let me know and I will continue the workup   PLEASE PLEASE PLEASE QUIT SMOKING!  Here's why:  1) Your lungs will not heal if they are constantly being irritated by smoke  2) Tobacco abuse and diabetes are a deadly combination.  Your risk of heart attack DOUBLES

## 2020-05-29 NOTE — Progress Notes (Signed)
Virtual Visit via [place]  This visit type was conducted due to national recommendations for restrictions regarding the COVID-19 pandemic (e.g. social distancing).  This format is felt to be most appropriate for this patient at this time.  All issues noted in this document were discussed and addressed.  No physical exam was performed (except for noted visual exam findings with Video Visits).   I connected with@ on 05/29/20 at  4:30 PM EDT by a video enabled telemedicine application  and verified that I am speaking with the correct person using two identifiers. Location patient: home Location provider: work or home office Persons participating in the virtual visit: patient, provider  I discussed the limitations, risks, security and privacy concerns of performing an evaluation and management service by telephone and the availability of in person appointments. I also discussed with the patient that there may be a patient responsible charge related to this service. The patient expressed understanding and agreed to proceed.   Reason for visit: nocturnal cough   HPI:    50 yr old male with ongoing tobacco abuse,  Type 2 DM  , GERD presents with nocturnal cough following a COVID NEGATIVE URI several weeks ago that was treated with decongestants and cough suppressants.  The cough is mild and infrequent during the day.  It is non productive and not accompanied by dyspnea and wheezing.  However he has developed a non exertional chest wall pain that is suggestive of pleurisy , and notes that the cough is keeping him up at night.  He has continued to smoke during his protracted illness     ROS: Patient denies headache, fevers, malaise, unintentional weight loss, skin rash, eye pain, sinus congestion and sinus pain, sore throat, dysphagia,  hemoptysis ,dyspnea, wheezing, palpitations, orthopnea, edema, abdominal pain, nausea, melena, diarrhea, constipation, flank pain, dysuria, hematuria, urinary   Frequency, nocturia, numbness, tingling, seizures,  Focal weakness, Loss of consciousness,  Tremor, insomnia, depression, anxiety, and suicidal ideation.      Past Medical History:  Diagnosis Date  . CHF (congestive heart failure) ([place])   . Diabetes mellitus without complication ([place])   . DVT (deep venous thrombosis) ([place]) 04/2017  . GERD (gastroesophageal reflux disease)   . History of migraine    none in over 2 yrs  . Hyperlipidemia   . Hypertension   . Syncope and collapse    x1 - approx 2-3 yrs ago - dehydration    Past Surgical History:  Procedure Laterality Date  . COLONOSCOPY    . COLONOSCOPY WITH PROPOFOL N/A 08/06/2017   Procedure: COLONOSCOPY WITH PROPOFOL;  Surgeon: Lucilla Lame, MD;  Location: [facility];  Service: Endoscopy;  Laterality: N/A;  Diabetic - oral meds  . ESOPHAGEAL DILATION  09/24/2016   Procedure: ESOPHAGEAL DILATION;  Surgeon: Lucilla Lame, MD;  Location: [facility];  Service: Gastroenterology;;  . ESOPHAGOGASTRODUODENOSCOPY (EGD) WITH PROPOFOL N/A 04/08/2015   Procedure: ESOPHAGOGASTRODUODENOSCOPY (EGD) WITH PROPOFOL with dialation;  Surgeon: Lucilla Lame, MD;  Location: [facility];  Service: Endoscopy;  Laterality: N/A;  diabetic - oral meds  . ESOPHAGOGASTRODUODENOSCOPY (EGD) WITH PROPOFOL N/A 09/24/2016   Procedure: ESOPHAGOGASTRODUODENOSCOPY (EGD) WITH PROPOFOL WITH DILATION;  Surgeon: Lucilla Lame, MD;  Location: [facility];  Service: Gastroenterology;  Laterality: N/A;  Diabetic - oral meds  . FINGER AMPUTATION  2009   partial removal left index finger   . POLYPECTOMY  08/06/2017   Procedure: POLYPECTOMY INTESTINAL;  Surgeon: Lucilla Lame, MD;  Location: [facility];  Service: Endoscopy;;    Family History  Problem Relation Age of Onset  . Heart disease Father        CAD  . Hypertension Father   . Diabetes Father   . Hypertension Sister   . Hypertension Brother   . Hypertension Brother   .  Hypertension Mother     SOCIAL HX:  reports that he has been smoking cigarettes. He has a 54.00 pack-year smoking history. He has never used smokeless tobacco. He reports that he does not drink alcohol and does not use drugs.   Current Outpatient Medications:  .  albuterol (VENTOLIN HFA) 108 (90 Base) MCG/ACT inhaler, Inhale 2 puffs into the lungs every 6 (six) hours as needed for wheezing or shortness of breath., Disp: 8 g, Rfl: 1 .  amLODipine (NORVASC) 10 MG tablet, Take 1 tablet (10 mg total) by mouth daily., Disp: 90 tablet, Rfl: 1 .  azithromycin (ZITHROMAX) 500 MG tablet, Take 1 tablet (500 mg total) by mouth daily., Disp: 7 tablet, Rfl: 0 .  cyanocobalamin 1000 MCG tablet, Take 100 mcg by mouth daily., Disp: , Rfl:  .  glipiZIDE (GLUCOTROL XL) 5 MG 24 hr tablet, Take 1 tablet (5 mg total) by mouth daily with breakfast., Disp: 90 tablet, Rfl: 1 .  glucose blood (ONETOUCH VERIO) test strip, Test sugars twice daily, Disp: 50 each, Rfl: 6 .  guaiFENesin-codeine (CHERATUSSIN AC) 100-10 MG/5ML syrup, Take 10 mLs by mouth at bedtime as needed for cough., Disp: 120 mL, Rfl: 0 .  metFORMIN (GLUCOPHAGE) 500 MG tablet, Take 1 tablet (500 mg total) by mouth 2 (two) times daily with a meal., Disp: 180 tablet, Rfl: 3 .  ONETOUCH DELICA LANCETS 37D MISC, Test sugars twice daily, Disp: 50 each, Rfl: 3 .  pantoprazole (PROTONIX) 40 MG tablet, TAKE 1 TABLET(40 MG) BY MOUTH DAILY, Disp: 90 tablet, Rfl: 1 .  Plecanatide (TRULANCE) 3 MG TABS, Take 3 mg by mouth daily., Disp: 30 tablet, Rfl: 5 .  predniSONE (DELTASONE) 10 MG tablet, 6 tablets on Day 1 , then reduce by 1 tablet daily until gone, Disp: 21 tablet, Rfl: 0 .  rosuvastatin (CRESTOR) 40 MG tablet, Take 1 tablet (40 mg total) by mouth at bedtime., Disp: 90 tablet, Rfl: 1 .  potassium chloride (KLOR-CON) 10 MEQ tablet, Take 1 tablet (10 mEq total) by mouth daily. (Patient not taking: No sig reported), Disp: 14 tablet, Rfl: 0  EXAM:  VITALS per  patient if applicable:  GENERAL: alert, oriented, appears well and in no acute distress  HEENT: atraumatic, conjunttiva clear, no obvious abnormalities on inspection of external nose and ears  NECK: normal movements of the head and neck  LUNGS: on inspection no signs of respiratory distress, breathing rate appears normal, no obvious gross SOB, gasping or wheezing  CV: no obvious cyanosis  MS: moves all visible extremities without noticeable abnormality  PSYCH/NEURO: pleasant and cooperative, no obvious depression or anxiety, speech and thought processing grossly intact  ASSESSMENT AND PLAN:  Discussed the following assessment and plan:  Lower respiratory infection (e.g., bronchitis, pneumonia, pneumonitis, pulmonitis)  Lower respiratory infection (e.g., bronchitis, pneumonia, pneumonitis, pulmonitis) May have been due to COVID with a false negative COVID test several weeks ago .  Given his current symptoms and tobacco history will treat with azithromycin, prednisone taper and cough suppressant     I discussed the assessment and treatment plan with the patient. The patient was provided an opportunity to ask questions and all were answered. The  patient agreed with the plan and demonstrated an understanding of the instructions.   The patient was advised to call back or seek an in-person evaluation if the symptoms worsen or if the condition fails to improve as anticipated.   I spent 30 minutes dedicated to the care of this patient on the date of this encounter to include pre-visit review of his medical history,  Face-to-face time with the patient , and post visit ordering of testing and therapeutics.    [name], MD

## 2020-05-31 ENCOUNTER — Telehealth: Payer: Self-pay

## 2020-05-31 DIAGNOSIS — J22 Unspecified acute lower respiratory infection: Secondary | ICD-10-CM | POA: Insufficient documentation

## 2020-05-31 NOTE — Telephone Encounter (Signed)
-----   Message from Lucilla Lame, MD sent at 05/30/2020  7:30 PM EDT ----- Please let the patient know that the CT scan did not show any acute issues with his abdomen or pelvis to explain his symptoms.  There was some prostate enlargement which she can discuss with his primary care provider otherwise the CT scan ruled out anything inflammatory or obstructive as the cause of his discomfort.

## 2020-05-31 NOTE — Assessment & Plan Note (Signed)
May have been due to [place] with a false negative COVID test several weeks ago .  Given his current symptoms and tobacco history will treat with azithromycin, prednisone taper and cough suppressant

## 2020-05-31 NOTE — Telephone Encounter (Signed)
Pt notified of CT scan results through mychart.  

## 2020-06-11 ENCOUNTER — Other Ambulatory Visit: Payer: Self-pay | Admitting: Internal Medicine

## 2020-06-11 ENCOUNTER — Ambulatory Visit (HOSPITAL_COMMUNITY)
Admission: RE | Admit: 2020-06-11 | Discharge: 2020-06-11 | Disposition: A | Discharge status: Home / Self Care | Payer: BC Managed Care – PPO | Source: Ambulatory Visit | Attending: Internal Medicine | Admitting: Internal Medicine

## 2020-06-11 ENCOUNTER — Other Ambulatory Visit: Payer: Self-pay

## 2020-06-11 DIAGNOSIS — R059 Cough, unspecified: Secondary | ICD-10-CM | POA: Diagnosis not present

## 2020-06-11 DIAGNOSIS — R053 Chronic cough: Secondary | ICD-10-CM | POA: Insufficient documentation

## 2020-06-11 DIAGNOSIS — F17200 Nicotine dependence, unspecified, uncomplicated: Secondary | ICD-10-CM | POA: Diagnosis not present

## 2020-06-11 MED ORDER — CHERATUSSIN AC 100-10 MG/5ML PO SOLN: 10.0000 mL | Freq: Every evening | ORAL | 0 refills | Status: DC | PRN

## 2020-06-13 ENCOUNTER — Other Ambulatory Visit: Payer: Self-pay | Admitting: Internal Medicine

## 2020-07-03 ENCOUNTER — Ambulatory Visit: Payer: BC Managed Care – PPO | Admitting: Gastroenterology

## 2020-07-03 ENCOUNTER — Encounter: Payer: Self-pay | Admitting: Gastroenterology

## 2020-07-03 ENCOUNTER — Other Ambulatory Visit: Payer: Self-pay

## 2020-07-03 VITALS — BP 142/86 | HR 78 | Temp 97.8°F | Ht 73.0 in | Wt 200.2 lb

## 2020-07-03 DIAGNOSIS — R109 Unspecified abdominal pain: Secondary | ICD-10-CM

## 2020-07-03 MED ORDER — DICYCLOMINE HCL 20 MG PO TABS: 20.0000 mg | ORAL_TABLET | Freq: Three times a day (TID) | ORAL | 0 refills | Status: DC

## 2020-07-03 NOTE — Progress Notes (Signed)
Primary Care Physician: [name], MD  Primary Gastroenterologist:  Dr. Lucilla Lame  Chief Complaint  Patient presents with  . Constipation    Abdominal pain    HPI: Jacob Baker is a 50 y.o. male here for follow-up of his constipation and abdominal pain.  The patient has had a CT scan and a colonoscopy without any source of his abdominal pain being seen.  The patient reports that the pain is a dull nagging pain and is constant.  It is not made any better or worse by eating or drinking.  He also states that it will sometimes wake him up from sleep.  There is no report of any unexplained weight loss.  Past Medical History:  Diagnosis Date  . CHF (congestive heart failure) ([place])   . Diabetes mellitus without complication ([place])   . DVT (deep venous thrombosis) ([place]) 04/2017  . GERD (gastroesophageal reflux disease)   . History of migraine    none in over 2 yrs  . Hyperlipidemia   . Hypertension   . Syncope and collapse    x1 - approx 2-3 yrs ago - dehydration    Current Outpatient Medications  Medication Sig Dispense Refill  . albuterol (VENTOLIN HFA) 108 (90 Base) MCG/ACT inhaler Inhale 2 puffs into the lungs every 6 (six) hours as needed for wheezing or shortness of breath. 8 g 1  . amLODipine (NORVASC) 10 MG tablet Take 1 tablet (10 mg total) by mouth daily. 90 tablet 1  . cyanocobalamin 1000 MCG tablet Take 100 mcg by mouth daily.    [place] Kitchen dicyclomine (BENTYL) 20 MG tablet Take 1 tablet (20 mg total) by mouth 3 (three) times daily before meals. 90 tablet 0  . glipiZIDE (GLUCOTROL XL) 5 MG 24 hr tablet Take 1 tablet (5 mg total) by mouth daily with breakfast. 90 tablet 1  . glucose blood (ONETOUCH VERIO) test strip Test sugars twice daily 50 each 6  . metFORMIN (GLUCOPHAGE) 500 MG tablet Take 1 tablet (500 mg total) by mouth 2 (two) times daily with a meal. 180 tablet 3  . ONETOUCH DELICA LANCETS 75T MISC Test sugars twice daily 50 each 3  . pantoprazole (PROTONIX) 40 MG  tablet TAKE 1 TABLET(40 MG) BY MOUTH DAILY 90 tablet 1  . Plecanatide (TRULANCE) 3 MG TABS Take 3 mg by mouth daily. 30 tablet 5  . potassium chloride (KLOR-CON) 10 MEQ tablet Take 1 tablet (10 mEq total) by mouth daily. 14 tablet 0  . rosuvastatin (CRESTOR) 40 MG tablet Take 1 tablet (40 mg total) by mouth at bedtime. 90 tablet 1   No current facility-administered medications for this visit.    Allergies as of 07/03/2020  . (No Known Allergies)    ROS:  General: Negative for anorexia, weight loss, fever, chills, fatigue, weakness. ENT: Negative for hoarseness, difficulty swallowing , nasal congestion. CV: Negative for chest pain, angina, palpitations, dyspnea on exertion, peripheral edema.  Respiratory: Negative for dyspnea at rest, dyspnea on exertion, cough, sputum, wheezing.  GI: See history of present illness. GU:  Negative for dysuria, hematuria, urinary incontinence, urinary frequency, nocturnal urination.  Endo: Negative for unusual weight change.    Physical Examination:   BP (!) 142/86   Pulse 78   Temp 97.8 F (36.6 C) (Temporal)   Ht 6\' 1"  (1.854 m)   Wt 200 lb 3.2 oz (90.8 kg)   BMI 26.41 kg/m   General: Well-nourished, well-developed in no acute distress.  Eyes: No  icterus. Conjunctivae pink. Lungs: Clear to auscultation bilaterally. Non-labored. Heart: Regular rate and rhythm, no murmurs rubs or gallops.  Abdomen: Bowel sounds are normal, nontender, nondistended, no hepatosplenomegaly or masses, no abdominal bruits or hernia , no rebound or guarding.   Extremities: No lower extremity edema. No clubbing or deformities. Neuro: Alert and oriented x 3.  Grossly intact. Skin: Warm and dry, no jaundice.   Psych: Alert and cooperative, normal mood and affect.  Labs:    Imaging Studies: DG Chest 2 View  Result Date: 06/13/2020 CLINICAL DATA:  50 year old male with history of persistent cough for the past several weeks. EXAM: CHEST - 2 VIEW COMPARISON:  Chest  x-ray 03/24/2020. FINDINGS: Lung volumes are normal. No consolidative airspace disease. No pleural effusions. No pneumothorax. No pulmonary nodule or mass noted. Pulmonary vasculature and the cardiomediastinal silhouette are within normal limits. IMPRESSION: No radiographic evidence of acute cardiopulmonary disease. Electronically Signed   By: Vinnie Langton M.D.   On: 06/13/2020 16:11    Assessment and Plan:   Jacob Baker is a 50 y.o. y/o male with abdominal pain that is not characteristic of a hollow organ with a constant nagging pain not being colicky or crampy and not associated with eating drinking or bowel movements.  It does not appear on physical exam to be musculoskeletal either and the patient has had no worry symptoms such as weight loss nausea or vomiting and he has had a colonoscopy without any cause for the symptoms seen.  The patient has also had a CT scan that was negative.  He has had this discomfort for years.  The patient will be tried on a trial of antispasmodics on the chance that his pain perception of intestinal cramps may be the nagging feeling he is reporting.  He will try the medication and let me know if it works.  The patient has been explained the plan and agrees with it.     Lucilla Lame, MD. [facility]    Note: This dictation was prepared with Dragon dictation along with smaller phrase technology. Any transcriptional errors that result from this process are unintentional.

## 2020-07-22 ENCOUNTER — Other Ambulatory Visit: Payer: Self-pay | Admitting: Internal Medicine

## 2020-07-31 ENCOUNTER — Encounter: Payer: Self-pay | Admitting: Internal Medicine

## 2020-07-31 ENCOUNTER — Other Ambulatory Visit: Payer: Self-pay

## 2020-07-31 ENCOUNTER — Ambulatory Visit (INDEPENDENT_AMBULATORY_CARE_PROVIDER_SITE_OTHER): Payer: BC Managed Care – PPO | Admitting: Internal Medicine

## 2020-07-31 VITALS — BP 126/84 | HR 82 | Temp 96.7°F | Resp 14 | Ht 71.0 in | Wt 203.0 lb

## 2020-07-31 DIAGNOSIS — N528 Other male erectile dysfunction: Secondary | ICD-10-CM | POA: Diagnosis not present

## 2020-07-31 DIAGNOSIS — R7989 Other specified abnormal findings of blood chemistry: Secondary | ICD-10-CM

## 2020-07-31 DIAGNOSIS — E1169 Type 2 diabetes mellitus with other specified complication: Secondary | ICD-10-CM

## 2020-07-31 DIAGNOSIS — R1010 Upper abdominal pain, unspecified: Secondary | ICD-10-CM

## 2020-07-31 DIAGNOSIS — E782 Mixed hyperlipidemia: Secondary | ICD-10-CM | POA: Diagnosis not present

## 2020-07-31 DIAGNOSIS — E1165 Type 2 diabetes mellitus with hyperglycemia: Secondary | ICD-10-CM

## 2020-07-31 DIAGNOSIS — E1121 Type 2 diabetes mellitus with diabetic nephropathy: Secondary | ICD-10-CM | POA: Diagnosis not present

## 2020-07-31 DIAGNOSIS — K5904 Chronic idiopathic constipation: Secondary | ICD-10-CM

## 2020-07-31 DIAGNOSIS — E785 Hyperlipidemia, unspecified: Secondary | ICD-10-CM

## 2020-07-31 MED ORDER — SILDENAFIL CITRATE 20 MG PO TABS: 20.0000 mg | ORAL_TABLET | Freq: Three times a day (TID) | ORAL | 5 refills | Status: DC

## 2020-07-31 NOTE — Progress Notes (Signed)
Subjective:  Patient ID: Jacob Baker, male    DOB: 1970/04/13  Age: 50 y.o. MRN: [id number]  CC: The primary encounter diagnosis was Other male erectile dysfunction. Diagnoses of Low testosterone, Mixed hyperlipidemia, Diabetes mellitus with microalbuminuric diabetic nephropathy (HCC), Chronic idiopathic constipation, Pain of upper abdomen, Uncontrolled type 2 diabetes mellitus with hyperglycemia ([place]), and Hyperlipidemia associated with type 2 diabetes mellitus ([place]) were also pertinent to this visit.  HPI Jacob Baker presents for FOLLOW UP on multipe chronic conditions   1) RUQ pain :   he was referred to Dr.  Allen Norris when symptoms failed to resolve with treatment for gastritis with PPI>  an Abd CT and  colonoscopy were both negative for GI pathology (EGD not done,  was done in 2018 for dysphagia and  stomach was normal) .  He was  Given Bentyl on June 8 with follow up planned.   2) Aortic atherosclerosis : seen on CT; discussed with patient today   3) BPH also noted on CT .  PSA's have been done annually and are normal/ he is asymptomatic, but has been having some difficulty in maintaining an erection and is requesting medication   4) DM type 2   a2c was 8.0 on April 27,  has been on  a eating binge with candy  and cakes.  Fasting sugars have been as high as 172 .  Working 2 am to 4 pm .  Eats a biscuit at 6;30 am , lunch is at 1:00 and is usually leftovers  of dinner at home.  Taking glipizide XL 5mg  daily  Denies neuropathy, hypoglycemic episodes.      Outpatient Medications Prior to Visit  Medication Sig Dispense Refill   albuterol (VENTOLIN HFA) 108 (90 Base) MCG/ACT inhaler Inhale 2 puffs into the lungs every 6 (six) hours as needed for wheezing or shortness of breath. 8 g 1   amLODipine (NORVASC) 10 MG tablet Take 1 tablet (10 mg total) by mouth daily. 90 tablet 1   cyanocobalamin 1000 MCG tablet Take 100 mcg by mouth daily.     dicyclomine (BENTYL) 20 MG tablet Take 1 tablet  (20 mg total) by mouth 3 (three) times daily before meals. 90 tablet 0   glucose blood (ONETOUCH VERIO) test strip Test sugars twice daily 50 each 6   ONETOUCH DELICA LANCETS 51V MISC Test sugars twice daily 50 each 3   pantoprazole (PROTONIX) 40 MG tablet TAKE 1 TABLET(40 MG) BY MOUTH DAILY 90 tablet 1   Plecanatide (TRULANCE) 3 MG TABS Take 3 mg by mouth daily. 30 tablet 5   potassium chloride (KLOR-CON) 10 MEQ tablet Take 1 tablet (10 mEq total) by mouth daily. 14 tablet 0   rosuvastatin (CRESTOR) 40 MG tablet Take 1 tablet (40 mg total) by mouth at bedtime. 90 tablet 1   glipiZIDE (GLUCOTROL XL) 5 MG 24 hr tablet Take 1 tablet (5 mg total) by mouth daily with breakfast. 90 tablet 1   metFORMIN (GLUCOPHAGE) 500 MG tablet Take 1 tablet (500 mg total) by mouth 2 (two) times daily with a meal. 180 tablet 3   No facility-administered medications prior to visit.    Review of Systems;  Patient denies headache, fevers, malaise, unintentional weight loss, skin rash, eye pain, sinus congestion and sinus pain, sore throat, dysphagia,  hemoptysis , cough, dyspnea, wheezing, chest pain, palpitations, orthopnea, edema, abdominal pain, nausea, melena, diarrhea, constipation, flank pain, dysuria, hematuria, urinary  Frequency, nocturia, numbness, tingling, seizures,  Focal  weakness, Loss of consciousness,  Tremor, insomnia, depression, anxiety, and suicidal ideation.      Objective:  BP 126/84 (BP Location: Left Arm, Patient Position: Sitting, Cuff Size: Large)   Pulse 82   Temp (!) 96.7 F (35.9 C) (Temporal)   Resp 14   Ht 5\' 11"  (1.803 m)   Wt 203 lb (92.1 kg)   SpO2 98%   BMI 28.31 kg/m   BP Readings from Last 3 Encounters:  07/31/20 126/84  07/03/20 (!) 142/86  05/29/20 (!) 144/64    Wt Readings from Last 3 Encounters:  07/31/20 203 lb (92.1 kg)  07/03/20 200 lb 3.2 oz (90.8 kg)  05/29/20 195 lb (88.5 kg)    General appearance: alert, cooperative and appears stated age Ears:  normal TM's and external ear canals both ears Throat: lips, mucosa, and tongue normal; teeth and gums normal Neck: no adenopathy, no carotid bruit, supple, symmetrical, trachea midline and thyroid not enlarged, symmetric, no tenderness/mass/nodules Back: symmetric, no curvature. ROM normal. No CVA tenderness. Lungs: clear to auscultation bilaterally Heart: regular rate and rhythm, S1, S2 normal, no murmur, click, rub or gallop Abdomen: soft, non-tender; bowel sounds normal; no masses,  no organomegaly Pulses: 2+ and symmetric Skin: Skin color, texture, turgor normal. No rashes or lesions Lymph nodes: Cervical, supraclavicular, and axillary nodes normal.  Lab Results  Component Value Date   HGBA1C 8.0 (A) 05/22/2020   HGBA1C 5.9 (H) 10/20/2019   HGBA1C 6.4 (A) 07/20/2019    Lab Results  Component Value Date   CREATININE 0.90 05/29/2020   CREATININE 0.82 03/24/2020   CREATININE 1.17 03/20/2020    Lab Results  Component Value Date   WBC 8.0 03/24/2020   HGB 13.3 03/24/2020   HCT 37.1 (L) 03/24/2020   PLT 274 03/24/2020   GLUCOSE 142 (H) 03/24/2020   CHOL 186 11/16/2019   TRIG 175 (H) 11/16/2019   HDL 30 (L) 11/16/2019   LDLDIRECT 176.0 10/18/2018   LDLCALC 127 (H) 11/16/2019   ALT 27 03/24/2020   AST 15 03/24/2020   NA 140 03/24/2020   K 3.1 (L) 03/24/2020   CL 106 03/24/2020   CREATININE 0.90 05/29/2020   BUN 12 03/24/2020   CO2 24 03/24/2020   TSH 1.32 11/16/2019   PSA 0.67 10/20/2019   INR 0.96 10/13/2011   HGBA1C 8.0 (A) 05/22/2020   MICROALBUR 30 05/22/2020    DG Chest 2 View  Result Date: 06/13/2020 CLINICAL DATA:  50 year old male with history of persistent cough for the past several weeks. EXAM: CHEST - 2 VIEW COMPARISON:  Chest x-ray 03/24/2020. FINDINGS: Lung volumes are normal. No consolidative airspace disease. No pleural effusions. No pneumothorax. No pulmonary nodule or mass noted. Pulmonary vasculature and the cardiomediastinal silhouette are within  normal limits. IMPRESSION: No radiographic evidence of acute cardiopulmonary disease. Electronically Signed   By: Vinnie Langton M.D.   On: 06/13/2020 16:11    Assessment & Plan:   Problem List Items Addressed This Visit       Unprioritized   Abdominal pain    Functional,  Per GI ,  With normal CT and colonoscopy. Bentyl given       Chronic idiopathic constipation    He has tried all otc products without success except for stimulant laxatives. colonoscopy  For same was unrevealing in 2019. He has seen GI and Trulance samples given        Hyperlipidemia associated with type 2 diabetes mellitus (HCC)    Loss of control noted  on lowest doses of  metformin and glipizide due to frequent dietary indiscretions.  Will increase metformin to 850 mg bid and increase glipizide in one month for post prandials > 160.  He has no microalbuminuria. Continue statin   Lab Results  Component Value Date   HGBA1C 8.0 (A) 05/22/2020   Lab Results  Component Value Date   MICROALBUR 30 05/22/2020   MICROALBUR 2.6 10/20/2019            Relevant Medications   metFORMIN (GLUCOPHAGE) 850 MG tablet   glipiZIDE (GLUCOTROL XL) 5 MG 24 hr tablet   Low testosterone    Rechecking level  Last know level was normal in 2018. Primary symptom is ED  Lab Results  Component Value Date   TESTOSTERONE 455 10/13/2016          Relevant Orders   Testosterone   Testosterone, free   Testosterone, % free   Sex hormone binding globulin   RESOLVED: Mixed hyperlipidemia   Relevant Medications   sildenafil (REVATIO) 20 MG tablet   Other Relevant Orders   Lipid panel   RESOLVED: Uncontrolled type 2 diabetes mellitus with hyperglycemia (HCC)   Relevant Medications   metFORMIN (GLUCOPHAGE) 850 MG tablet   glipiZIDE (GLUCOTROL XL) 5 MG 24 hr tablet   Other Visit Diagnoses     Other male erectile dysfunction    -  Primary       I have changed Shanon Brow A. Ritsema's metFORMIN and glipiZIDE. I am also  having him start on sildenafil. Additionally, I am having him maintain his OneTouch Delica Lancets 60Y, glucose blood, cyanocobalamin, albuterol, rosuvastatin, amLODipine, potassium chloride, Trulance, dicyclomine, and pantoprazole.  Meds ordered this encounter  Medications   sildenafil (REVATIO) 20 MG tablet    Sig: Take 1 tablet (20 mg total) by mouth 3 (three) times daily.    Dispense:  30 tablet    Refill:  5   metFORMIN (GLUCOPHAGE) 850 MG tablet    Sig: Take 1 tablet (850 mg total) by mouth 2 (two) times daily with a meal.    Dispense:  180 tablet    Refill:  0   glipiZIDE (GLUCOTROL XL) 5 MG 24 hr tablet    Sig: Take 2 tablets (10 mg total) by mouth daily with breakfast.    Dispense:  180 tablet    Refill:  1   A total of 40 minutes of face to face time was spent with patient more than half of which was spent in counselling on his diabetes management,  reviewing recent GI workup for constipation and  abdominal pain, and coordination of care    Medications Discontinued During This Encounter  Medication Reason   metFORMIN (GLUCOPHAGE) 500 MG tablet    glipiZIDE (GLUCOTROL XL) 5 MG 24 hr tablet     Follow-up: No follow-ups on file.   [name], MD

## 2020-07-31 NOTE — Patient Instructions (Addendum)
Check sugars once daily either at:  1) waking up 1 am (fasting)  2) 4 pm at home ( 2 hr post lunch)  3) 2 hrs after dinner     Return on July 27 for fasting labs   Your CPE is due on or after Sept 24.  You will not need labs because we will have done them in late July     You can take metamucil or fibercon daily  and  you can add colace (docusate  200 mg daily) as a stool softener .  This may enablel you to take the  Trulance every other da.  Make sure you get 60 ounces of water minimum daily ( 3 or 4 bottles) .    Dr Malachi Bonds  has 40 g sugar in each 12 ounce can and 40 grams of caffeine!!     Prescribing generic Viagra for ED.  You may use up to 3 tablets at a time,  as needed . Prior authorization may be required

## 2020-08-03 MED ORDER — GLIPIZIDE ER 5 MG PO TB24: 10.0000 mg | ORAL_TABLET | Freq: Every day | ORAL | 1 refills | Status: DC

## 2020-08-03 MED ORDER — METFORMIN HCL 850 MG PO TABS: 850.0000 mg | ORAL_TABLET | Freq: Two times a day (BID) | ORAL | 0 refills | Status: DC

## 2020-08-03 NOTE — Assessment & Plan Note (Addendum)
Loss of control noted on lowest doses of  metformin and glipizide due to frequent dietary indiscretions.  Will increase metformin to 850 mg bid and increase glipizide in one month for post prandials > 160.  He has no microalbuminuria. Continue statin   Lab Results  Component Value Date   HGBA1C 8.0 (A) 05/22/2020   Lab Results  Component Value Date   MICROALBUR 30 05/22/2020   MICROALBUR 2.6 10/20/2019

## 2020-08-03 NOTE — Assessment & Plan Note (Signed)
Functional,  Per GI ,  With normal CT and colonoscopy. Bentyl given

## 2020-08-03 NOTE — Assessment & Plan Note (Signed)
Rechecking level  Last know level was normal in 2018. Primary symptom is ED  Lab Results  Component Value Date   TESTOSTERONE 455 10/13/2016

## 2020-08-03 NOTE — Assessment & Plan Note (Signed)
He has tried all otc products without success except for stimulant laxatives. colonoscopy  For same was unrevealing in 2019. He has seen GI and Trulance samples given

## 2020-08-09 ENCOUNTER — Other Ambulatory Visit: Payer: Self-pay | Admitting: Internal Medicine

## 2020-08-12 ENCOUNTER — Telehealth: Payer: Self-pay | Admitting: Internal Medicine

## 2020-08-12 NOTE — Telephone Encounter (Signed)
LMTCB. Need to let pt know that his labs have been ordered for him to have done at [place].

## 2020-08-12 NOTE — Addendum Note (Signed)
Addended by: Adair Laundry on: 08/12/2020 01:38 PM   Modules accepted: Orders

## 2020-08-12 NOTE — Telephone Encounter (Signed)
PT called to inquire about getting his lab orders for July 27 sent to [facility] for his labs that way he does not have to come all the way to [place]. He would like for it to be sent to the [facility] across from the [facility].

## 2020-08-14 NOTE — Telephone Encounter (Signed)
LM letting pt know that labs have been reordered to have done at [place] across from [facility].

## 2020-08-21 ENCOUNTER — Other Ambulatory Visit: Payer: BC Managed Care – PPO

## 2020-08-21 DIAGNOSIS — E1121 Type 2 diabetes mellitus with diabetic nephropathy: Secondary | ICD-10-CM | POA: Diagnosis not present

## 2020-08-21 DIAGNOSIS — E782 Mixed hyperlipidemia: Secondary | ICD-10-CM | POA: Diagnosis not present

## 2020-08-21 DIAGNOSIS — R7989 Other specified abnormal findings of blood chemistry: Secondary | ICD-10-CM | POA: Diagnosis not present

## 2020-08-26 LAB — COMPREHENSIVE METABOLIC PANEL
ALT: 12 IU/L (ref 0–44)
AST: 8 IU/L (ref 0–40)
Albumin/Globulin Ratio: 2 (ref 1.2–2.2)
Albumin: 4.7 g/dL (ref 3.8–4.9)
Alkaline Phosphatase: 126 IU/L — ABNORMAL HIGH (ref 44–121)
BUN/Creatinine Ratio: 13 (ref 9–20)
BUN: 12 mg/dL (ref 6–24)
Bilirubin Total: 0.5 mg/dL (ref 0.0–1.2)
CO2: 22 mmol/L (ref 20–29)
Calcium: 9.7 mg/dL (ref 8.7–10.2)
Chloride: 104 mmol/L (ref 96–106)
Creatinine, Ser: 0.9 mg/dL (ref 0.76–1.27)
Globulin, Total: 2.3 g/dL (ref 1.5–4.5)
Glucose: 142 mg/dL — ABNORMAL HIGH (ref 65–99)
Potassium: 4.3 mmol/L (ref 3.5–5.2)
Sodium: 142 mmol/L (ref 134–144)
Total Protein: 7 g/dL (ref 6.0–8.5)
eGFR: 103 mL/min/{1.73_m2} (ref >59)

## 2020-08-26 LAB — HEMOGLOBIN A1C
Est. average glucose Bld gHb Est-mCnc: 143 mg/dL
Hgb A1c MFr Bld: 6.6 % — ABNORMAL HIGH (ref 4.8–5.6)

## 2020-08-26 LAB — MICROALBUMIN / CREATININE URINE RATIO
Creatinine, Urine: 148 mg/dL
Microalb/Creat Ratio: 11 mg/g creat (ref 0–29)
Microalbumin, Urine: 16.9 ug/mL

## 2020-08-26 LAB — TESTOSTERONE: Testosterone: 182 ng/dL — ABNORMAL LOW (ref 264–916)

## 2020-08-26 LAB — LIPID PANEL
Chol/HDL Ratio: 6.9 ratio — ABNORMAL HIGH (ref 0.0–5.0)
Cholesterol, Total: 192 mg/dL (ref 100–199)
HDL: 28 mg/dL — ABNORMAL LOW (ref >39)
LDL Chol Calc (NIH): 133 mg/dL — ABNORMAL HIGH (ref 0–99)
Triglycerides: 170 mg/dL — ABNORMAL HIGH (ref 0–149)
VLDL Cholesterol Cal: 31 mg/dL (ref 5–40)

## 2020-08-26 LAB — SEX HORMONE BINDING GLOBULIN: Sex Hormone Binding: 29.6 nmol/L (ref 19.3–76.4)

## 2020-08-26 LAB — TESTOSTERONE, FREE: Testosterone, Free: 2.4 pg/mL — ABNORMAL LOW (ref 7.2–24.0)

## 2020-08-26 LAB — TESTOSTERONE, % FREE: Testosterone-% Free: 0.7 %

## 2020-09-24 ENCOUNTER — Other Ambulatory Visit: Payer: Self-pay

## 2020-09-24 ENCOUNTER — Ambulatory Visit (INDEPENDENT_AMBULATORY_CARE_PROVIDER_SITE_OTHER): Payer: BC Managed Care – PPO | Admitting: "Endocrinology

## 2020-09-24 ENCOUNTER — Encounter: Payer: Self-pay | Admitting: "Endocrinology

## 2020-09-24 VITALS — BP 128/82 | HR 85 | Ht 71.0 in

## 2020-09-24 DIAGNOSIS — E782 Mixed hyperlipidemia: Secondary | ICD-10-CM | POA: Diagnosis not present

## 2020-09-24 DIAGNOSIS — E1165 Type 2 diabetes mellitus with hyperglycemia: Secondary | ICD-10-CM | POA: Diagnosis not present

## 2020-09-24 NOTE — Patient Instructions (Signed)

## 2020-09-24 NOTE — Progress Notes (Signed)
09/24/2020, 6:19 PM   Endocrinology follow-up note  Subjective:    Patient ID: Jacob Baker, male    DOB: [birth year].  Jacob Baker is being seen in follow-up after he was seen in consultation for management of currently uncontrolled symptomatic diabetes requested by  [name], MD.   Past Medical History:  Diagnosis Date   CHF (congestive heart failure) ([place])    Diabetes mellitus without complication ([place])    DVT (deep venous thrombosis) ([place]) 04/2017   GERD (gastroesophageal reflux disease)    History of migraine    none in over 2 yrs   Hyperlipidemia    Hypertension    Syncope and collapse    x1 - approx 2-3 yrs ago - dehydration    Past Surgical History:  Procedure Laterality Date   COLONOSCOPY     COLONOSCOPY WITH PROPOFOL N/A 08/06/2017   Procedure: COLONOSCOPY WITH PROPOFOL;  Surgeon: Lucilla Lame, MD;  Location: [place];  Service: Endoscopy;  Laterality: N/A;  Diabetic - oral meds   ESOPHAGEAL DILATION  09/24/2016   Procedure: ESOPHAGEAL DILATION;  Surgeon: Lucilla Lame, MD;  Location: [place];  Service: Gastroenterology;;   ESOPHAGOGASTRODUODENOSCOPY (EGD) WITH PROPOFOL N/A 04/08/2015   Procedure: ESOPHAGOGASTRODUODENOSCOPY (EGD) WITH PROPOFOL with dialation;  Surgeon: Lucilla Lame, MD;  Location: [place];  Service: Endoscopy;  Laterality: N/A;  diabetic - oral meds   ESOPHAGOGASTRODUODENOSCOPY (EGD) WITH PROPOFOL N/A 09/24/2016   Procedure: ESOPHAGOGASTRODUODENOSCOPY (EGD) WITH PROPOFOL WITH DILATION;  Surgeon: Lucilla Lame, MD;  Location: [place];  Service: Gastroenterology;  Laterality: N/A;  Diabetic - oral meds   FINGER AMPUTATION  2009   partial removal left index finger    POLYPECTOMY  08/06/2017   Procedure: POLYPECTOMY INTESTINAL;  Surgeon: Lucilla Lame, MD;  Location: [place];  Service: Endoscopy;;    Social History    Socioeconomic History   Marital status: Married    Spouse name: Not on file   Number of children: Not on file   Years of education: Not on file   Highest education level: Not on file  Occupational History   Not on file  Tobacco Use   Smoking status: Every Day    Packs/day: 2.00    Years: 27.00    Pack years: 54.00    Types: Cigarettes   Smokeless tobacco: Never  Vaping Use   Vaping Use: Never used  Substance and Sexual Activity   Alcohol use: No   Drug use: No   Sexual activity: Not on file  Other Topics Concern   Not on file  Social History Narrative   Not on file   Social Determinants of Health   Financial Resource Strain: Not on file  Food Insecurity: Not on file  Transportation Needs: Not on file  Physical Activity: Not on file  Stress: Not on file  Social Connections: Not on file    Family History  Problem Relation Age of Onset   Heart disease Father        CAD   Hypertension Father    Diabetes Father    Hypertension Sister    Hypertension Brother  Hypertension Brother    Hypertension Mother     Outpatient Encounter Medications as of 09/24/2020  Medication Sig   albuterol (VENTOLIN HFA) 108 (90 Base) MCG/ACT inhaler Inhale 2 puffs into the lungs every 6 (six) hours as needed for wheezing or shortness of breath.   amLODipine (NORVASC) 10 MG tablet TAKE 1 TABLET(10 MG) BY MOUTH DAILY   cyanocobalamin 1000 MCG tablet Take 100 mcg by mouth daily.   dicyclomine (BENTYL) 20 MG tablet Take 1 tablet (20 mg total) by mouth 3 (three) times daily before meals.   glipiZIDE (GLUCOTROL XL) 5 MG 24 hr tablet Take 2 tablets (10 mg total) by mouth daily with breakfast.   glucose blood (ONETOUCH VERIO) test strip Test sugars twice daily   metFORMIN (GLUCOPHAGE) 850 MG tablet Take 1 tablet (850 mg total) by mouth 2 (two) times daily with a meal.   ONETOUCH DELICA LANCETS 99991111 MISC Test sugars twice daily   pantoprazole (PROTONIX) 40 MG tablet TAKE 1 TABLET(40 MG) BY  MOUTH DAILY   Plecanatide (TRULANCE) 3 MG TABS Take 3 mg by mouth daily.   potassium chloride (KLOR-CON) 10 MEQ tablet Take 1 tablet (10 mEq total) by mouth daily.   rosuvastatin (CRESTOR) 40 MG tablet Take 1 tablet (40 mg total) by mouth at bedtime.   sildenafil (REVATIO) 20 MG tablet Take 1 tablet (20 mg total) by mouth 3 (three) times daily.   No facility-administered encounter medications on file as of 09/24/2020.    ALLERGIES: No Known Allergies  VACCINATION STATUS: Immunization History  Administered Date(s) Administered   Influenza Split 12/15/2013   Influenza,inj,Quad PF,6+ Mos 10/26/2014, 10/14/2015, 10/13/2017, 10/18/2018, 10/20/2019   Influenza-Unspecified 09/26/2016   Moderna Sars-Covid-2 Vaccination 04/28/2019, 05/30/2019   Pneumococcal Polysaccharide-23 10/14/2015   Tdap 10/26/2014    Diabetes He presents for his follow-up diabetic visit. He has type 2 diabetes mellitus. His disease course has been improving. There are no hypoglycemic associated symptoms. Pertinent negatives for hypoglycemia include no confusion, headaches, pallor or seizures. Associated symptoms include polydipsia and polyuria. Pertinent negatives for diabetes include no chest pain, no fatigue, no polyphagia and no weakness. Symptoms are improving. There are no diabetic complications. Risk factors for coronary artery disease include dyslipidemia, diabetes mellitus, male sex, family history and tobacco exposure. Current diabetic treatment includes oral agent (dual therapy). His weight is fluctuating minimally. He is following a generally unhealthy diet. When asked about meal planning, he reported none. He has not had a previous visit with a dietitian. He rarely participates in exercise. His home blood glucose trend is decreasing steadily. His breakfast blood glucose range is generally 130-140 mg/dl. His overall blood glucose range is 130-140 mg/dl. (He presents with improved A1c of 6.6% from 8%.  He did not  monitor blood glucose adequately.  He denies hypoglycemia.    )  Hyperlipidemia This is a chronic problem. The current episode started more than 1 year ago. The problem is uncontrolled. Exacerbating diseases include diabetes. Pertinent negatives include no chest pain, myalgias or shortness of breath. Current antihyperlipidemic treatment includes statins. Risk factors for coronary artery disease include diabetes mellitus, dyslipidemia, male sex and family history.  Hypertension This is a chronic problem. The current episode started more than 1 year ago. The problem is controlled. Pertinent negatives include no chest pain, headaches, neck pain, palpitations or shortness of breath. Risk factors for coronary artery disease include family history, dyslipidemia, diabetes mellitus, male gender and smoking/tobacco exposure. Past treatments include calcium channel blockers.    Review  of Systems  Constitutional:  Negative for chills, fatigue, fever and unexpected weight change.  HENT:  Negative for dental problem, mouth sores and trouble swallowing.   Eyes:  Negative for visual disturbance.  Respiratory:  Negative for cough, choking, chest tightness, shortness of breath and wheezing.   Cardiovascular:  Negative for chest pain, palpitations and leg swelling.  Gastrointestinal:  Negative for abdominal distention, abdominal pain, constipation, diarrhea, nausea and vomiting.  Endocrine: Positive for polydipsia and polyuria. Negative for polyphagia.  Genitourinary:  Negative for dysuria, flank pain, hematuria and urgency.  Musculoskeletal:  Negative for back pain, gait problem, myalgias and neck pain.  Skin:  Negative for pallor, rash and wound.  Neurological:  Negative for seizures, syncope, weakness, numbness and headaches.  Psychiatric/Behavioral:  Negative for confusion and dysphoric mood.    Objective:    BP 128/82   Pulse 85   Ht '5\' 11"'$  (1.803 m)   BMI 28.31 kg/m   Wt Readings from Last 3  Encounters:  07/31/20 203 lb (92.1 kg)  07/03/20 200 lb 3.2 oz (90.8 kg)  05/29/20 195 lb (88.5 kg)     Physical Exam Constitutional:      General: He is not in acute distress.    Appearance: He is well-developed.  HENT:     Head: Normocephalic and atraumatic.  Neck:     Thyroid: No thyromegaly.     Trachea: No tracheal deviation.  Cardiovascular:     Rate and Rhythm: Normal rate.     Pulses:          Dorsalis pedis pulses are 1+ on the right side and 1+ on the left side.       Posterior tibial pulses are 1+ on the right side and 1+ on the left side.     Heart sounds: S1 normal and S2 normal. No murmur heard.   No gallop.  Pulmonary:     Effort: Pulmonary effort is normal. No respiratory distress.     Breath sounds: No wheezing.  Abdominal:     General: There is no distension.     Tenderness: There is no abdominal tenderness. There is no guarding.  Musculoskeletal:     Right shoulder: No swelling or deformity.     Cervical back: Normal range of motion and neck supple.  Skin:    General: Skin is warm and dry.     Findings: No rash.     Nails: There is no clubbing.  Neurological:     Mental Status: He is alert and oriented to person, place, and time.     Cranial Nerves: No cranial nerve deficit.     Sensory: No sensory deficit.     Gait: Gait normal.     Deep Tendon Reflexes: Reflexes are normal and symmetric.  Psychiatric:        Speech: Speech normal.        Behavior: Behavior normal. Behavior is cooperative.        Thought Content: Thought content normal.        Judgment: Judgment normal.      CMP ( most recent) CMP     Component Value Date/Time   NA 142 08/21/2020 0824   NA 138 10/04/2013 2025   K 4.3 08/21/2020 0824   K 3.0 (L) 10/04/2013 2025   CL 104 08/21/2020 0824   CL 104 10/04/2013 2025   CO2 22 08/21/2020 0824   CO2 25 10/04/2013 2025   GLUCOSE 142 (H) 08/21/2020 0824   GLUCOSE  142 (H) 03/24/2020 2141   GLUCOSE 175 (H) 10/04/2013 2025   BUN 12  08/21/2020 0824   BUN 13 10/04/2013 2025   CREATININE 0.90 08/21/2020 0824   CREATININE 1.04 11/16/2019 1505   CALCIUM 9.7 08/21/2020 0824   CALCIUM 9.2 10/04/2013 2025   PROT 7.0 08/21/2020 0824   PROT 7.4 10/04/2013 2025   ALBUMIN 4.7 08/21/2020 0824   ALBUMIN 4.1 10/04/2013 2025   AST 8 08/21/2020 0824   AST 8 (L) 10/04/2013 2025   ALT 12 08/21/2020 0824   ALT 19 10/04/2013 2025   ALKPHOS 126 (H) 08/21/2020 0824   ALKPHOS 114 10/04/2013 2025   BILITOT 0.5 08/21/2020 0824   BILITOT 0.4 10/04/2013 2025   GFRNONAA >60 03/24/2020 2141   GFRNONAA 83 11/16/2019 1505   GFRAA 96 11/16/2019 1505     Diabetic Labs (most recent): Lab Results  Component Value Date   HGBA1C 6.6 (H) 08/21/2020   HGBA1C 8.0 (A) 05/22/2020   HGBA1C 5.9 (H) 10/20/2019     Lipid Panel ( most recent) Lipid Panel     Component Value Date/Time   CHOL 192 08/21/2020 0824   CHOL 180 03/23/2013 0422   TRIG 170 (H) 08/21/2020 0824   TRIG 218 (H) 03/23/2013 0422   HDL 28 (L) 08/21/2020 0824   HDL 21 (L) 03/23/2013 0422   CHOLHDL 6.9 (H) 08/21/2020 0824   CHOLHDL 6.2 (H) 11/16/2019 1505   VLDL 53.8 (H) 10/18/2018 1406   VLDL 44 (H) 03/23/2013 0422   LDLCALC 133 (H) 08/21/2020 0824   LDLCALC 127 (H) 11/16/2019 1505   LDLCALC 115 (H) 03/23/2013 0422   LDLDIRECT 176.0 10/18/2018 1406   LABVLDL 31 08/21/2020 0824      Lab Results  Component Value Date   TSH 1.32 11/16/2019   TSH 1.38 10/18/2018   TSH 1.07 06/11/2016   TSH 1.66 03/12/2014   TSH 1.140 11/24/2013   TSH 1.42 02/25/2012   FREET4 1.2 11/16/2019   FREET4 1.00 04/23/2014      Assessment & Plan:   1. Uncontrolled type 2 diabetes mellitus with hyperglycemia (HCC)  - Jacob Baker has currently uncontrolled symptomatic type 2 DM since  50 years of age.  He presents with improved A1c of 6.6% from 8%.  He did not monitor blood glucose adequately.  He denies hypoglycemia.      Recent labs reviewed, showing normal renal function. -  I had a long discussion with him about the progressive nature of diabetes and the pathology behind its complications. -his diabetes is complicated by smoking and he remains at a high risk for more acute and chronic complications which include CAD, CVA, CKD, retinopathy, and neuropathy. These are all discussed in detail with him.  - I have counseled him on diet  and weight management  by adopting a carbohydrate restricted/protein rich diet. Patient is encouraged to switch to  unprocessed or minimally processed     complex starch and increased protein intake (animal or plant source), fruits, and vegetables. -  he is advised to stick to a routine mealtimes to eat 3 meals  a day and avoid unnecessary snacks ( to snack only to correct hypoglycemia).    - he acknowledges that there is a room for improvement in his food and drink choices. - Suggestion is made for him to avoid simple carbohydrates  from his diet including Cakes, Sweet Desserts, Ice Cream, Soda (diet and regular), Sweet Tea, Candies, Chips, Cookies, Store Bought Juices, Alcohol in Excess  of  1-2 drinks a day, Artificial Sweeteners,  Coffee Creamer, and "Sugar-free" Products, Lemonade. This will help patient to have more stable blood glucose profile and potentially avoid unintended weight gain.   - he will be scheduled with Jearld Fenton, RDN, CDE for diabetes education.  - I have approached him with the following individualized plan to manage  his diabetes and patient agrees:   -Based on his current glycemic response, he will not need insulin treatment for now.  He is advised to lower his glipizide to 5 mg XL p.o. daily only at breakfast.  He is advised to continue metformin 850 mg p.o. twice daily.    -He is approached and willing to start monitoring blood glucose at least once a day-daily before breakfast and report if fasting blood glucose is greater than 200 or less than 70.    - Specific targets for  A1c;  LDL, HDL, Triglycerides,  and  Waist Circumference were discussed with the patient.  2) Blood Pressure /Hypertension: -His blood pressure is controlled to target.  He is advised to continue amlodipine 10 mg p.o. daily at breakfast.   3) Lipids/Hyperlipidemia:   Review of his recent repeat lipid panel showed slight improvement in his LDL to 127 from 167.  This could be response to his Crestor 40 mg p.o. nightly.  He is advised to continue.  4)  Weight/Diet:  Body mass index is 28.31 kg/m.  -    he is not a candidate for major weight loss. I discussed with him the fact that loss of 5 - 10% of his  current body weight will have the most impact on his diabetes management.  Exercise, and detailed carbohydrates information provided  -  detailed on discharge instructions.  5) Chronic Care/Health Maintenance:  -he  is on  Statin medications and  is encouraged to initiate and continue to follow up with Ophthalmology, Dentist,  Podiatrist at least yearly or according to recommendations, and advised to  quit smoking.  The patient was counseled on the dangers of tobacco use, and was advised to quit.  Reviewed strategies to maximize success, including removing cigarettes and smoking materials from environment.  -  I have recommended yearly flu vaccine and pneumonia vaccine at least every 5 years; moderate intensity exercise for up to 150 minutes weekly; and  sleep for at least 7 hours a day.  - he is  advised to maintain close follow up with [name], MD for primary care needs, as well as his other providers for optimal and coordinated care.    I spent 31 minutes in the care of the patient today including review of labs from [facility], Lipids, Thyroid Function, Hematology (current and previous including abstractions from other facilities); face-to-face time discussing  his blood glucose readings/logs, discussing hypoglycemia and hyperglycemia episodes and symptoms, medications doses, his options of short and long term treatment  based on the latest standards of care / guidelines;  discussion about incorporating lifestyle medicine;  and documenting the encounter.    Please refer to Patient Instructions for Blood Glucose Monitoring and Insulin/Medications Dosing Guide"  in media tab for additional information. Please  also refer to " Patient Self Inventory" in the Media  tab for reviewed elements of pertinent patient history.  Jacob Baker participated in the discussions, expressed understanding, and voiced agreement with the above plans.  All questions were answered to his satisfaction. he is encouraged to contact clinic should he have any questions or concerns prior  to his return visit.   Follow up plan: - Return in about 4 months (around 01/24/2021) for Bring Meter and Logs- A1c in Office.  Glade Lloyd, MD [facility] [address] [place], [place] [address] Phone: [phone]  Fax: [phone]    09/24/2020, 6:19 PM  This note was partially dictated with voice recognition software. Similar sounding words can be transcribed inadequately or may not  be corrected upon review.

## 2020-10-21 ENCOUNTER — Other Ambulatory Visit: Payer: Self-pay

## 2020-10-21 ENCOUNTER — Ambulatory Visit (INDEPENDENT_AMBULATORY_CARE_PROVIDER_SITE_OTHER): Payer: BC Managed Care – PPO | Admitting: Internal Medicine

## 2020-10-21 ENCOUNTER — Encounter: Payer: Self-pay | Admitting: Internal Medicine

## 2020-10-21 VITALS — BP 126/84 | HR 87 | Temp 96.4°F | Ht 73.0 in | Wt 201.6 lb

## 2020-10-21 DIAGNOSIS — T39395A Adverse effect of other nonsteroidal anti-inflammatory drugs [NSAID], initial encounter: Secondary | ICD-10-CM

## 2020-10-21 DIAGNOSIS — F17200 Nicotine dependence, unspecified, uncomplicated: Secondary | ICD-10-CM

## 2020-10-21 DIAGNOSIS — Z125 Encounter for screening for malignant neoplasm of prostate: Secondary | ICD-10-CM | POA: Diagnosis not present

## 2020-10-21 DIAGNOSIS — K2960 Other gastritis without bleeding: Secondary | ICD-10-CM

## 2020-10-21 DIAGNOSIS — Z0000 Encounter for general adult medical examination without abnormal findings: Secondary | ICD-10-CM

## 2020-10-21 DIAGNOSIS — E1169 Type 2 diabetes mellitus with other specified complication: Secondary | ICD-10-CM

## 2020-10-21 DIAGNOSIS — F17209 Nicotine dependence, unspecified, with unspecified nicotine-induced disorders: Secondary | ICD-10-CM

## 2020-10-21 DIAGNOSIS — M25511 Pain in right shoulder: Secondary | ICD-10-CM | POA: Diagnosis not present

## 2020-10-21 DIAGNOSIS — Z23 Encounter for immunization: Secondary | ICD-10-CM

## 2020-10-21 DIAGNOSIS — E782 Mixed hyperlipidemia: Secondary | ICD-10-CM

## 2020-10-21 DIAGNOSIS — G8929 Other chronic pain: Secondary | ICD-10-CM | POA: Diagnosis not present

## 2020-10-21 MED ORDER — CELECOXIB 200 MG PO CAPS: 200.0000 mg | ORAL_CAPSULE | Freq: Two times a day (BID) | ORAL | 0 refills | Status: DC

## 2020-10-21 NOTE — Progress Notes (Signed)
Patient ID: Jacob Baker, male    DOB: Sep 16, 1970  Age: 50 y.o. MRN: [id number]  The patient is here for annual preventive examination and management of other chronic and acute problems.  This visit occurred during the SARS-CoV-2 public health emergency.  Safety protocols were in place, including screening questions prior to the visit, additional usage of staff PPE, and extensive cleaning of exam room while observing appropriate contact time as indicated for disinfecting solutions.     The risk factors are reflected in the social history.  The roster of all physicians providing medical care to patient - is listed in the Snapshot section of the chart.  Activities of daily living:  The patient is 100% independent in all ADLs: dressing, toileting, feeding as well as independent mobility  Home safety : The patient has smoke detectors in the home. They wear seatbelts.  There are no firearms at home. There is no violence in the home.   There is no risks for hepatitis, STDs or HIV. There is no   history of blood transfusion. They have no travel history to infectious disease endemic areas of the world.  The patient has seen their dentist in the last six month. They have seen their eye doctor in the last year. They admit to slight hearing difficulty with regard to whispered voices and some television programs.  They have deferred audiologic testing in the last year.  They do not  have excessive sun exposure. Discussed the need for sun protection: hats, long sleeves and use of sunscreen if there is significant sun exposure.   Diet: the importance of a healthy diet is discussed. They do have a healthy diet.  The benefits of regular aerobic exercise were discussed. She walks 4 times per week ,  20 minutes.   Depression screen: there are no signs or vegative symptoms of depression- irritability, change in appetite, anhedonia, sadness/tearfullness.  Cognitive assessment: the patient manages all their  financial and personal affairs and is actively engaged. They could relate day,date,year and events; recalled 2/3 objects at 3 minutes; performed clock-face test normally.  The following portions of the patient's history were reviewed and updated as appropriate: allergies, current medications, past family history, past medical history,  past surgical history, past social history  and problem list.  Visual acuity was not assessed per patient preference since she has regular follow up with her ophthalmologist. Hearing and body mass index were assessed and reviewed.   During the course of the visit the patient was educated and counseled about appropriate screening and preventive services including : fall prevention , diabetes screening, nutrition counseling, colorectal cancer screening, and recommended immunizations.    CC: The primary encounter diagnosis was Chronic right shoulder pain. Diagnoses of Prostate cancer screening, Encounter for preventive health examination, Dyslipidemia with low high density lipoprotein (HDL) cholesterol with hypertriglyceridemia due to type 2 diabetes mellitus ([place]), Gastritis due to nonsteroidal anti-inflammatory drug (NSAID), Current smoker, and Tobacco use disorder, continuous were also pertinent to this visit.  1) recurrent episodes of prolonged right shoulder pain since 2020.   Saw Orthopedist I June 2020 who injected shoulder and recommended therapy and MRI   Neither was done due to cost and time.  Shoulder pain resolved for while but will periodically return and is currently persistent and ruining his sleep.  Pain is worse with adduction (reaching across chest to grab the shoulder harness . 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  Currently taking ibuprofen daily, 1000  mg or more once daily at the most .      History Jacob Baker has a past medical history of CHF (congestive heart failure) ([place]), Diabetes mellitus without complication ([place]), DVT (deep venous  thrombosis) ([place]) (04/2017), GERD (gastroesophageal reflux disease), History of migraine, Hyperlipidemia, Hypertension, and Syncope and collapse.   He has a past surgical history that includes Finger amputation (2009); Colonoscopy; Esophagogastroduodenoscopy (egd) with propofol (N/A, 04/08/2015); Esophagogastroduodenoscopy (egd) with propofol (N/A, 09/24/2016); Esophageal dilation (09/24/2016); Colonoscopy with propofol (N/A, 08/06/2017); and Polypectomy (08/06/2017).   His family history includes Diabetes in his father; Heart disease in his father; Hypertension in his brother, brother, father, mother, and sister.He reports that he has been smoking cigarettes. He has a 54.00 pack-year smoking history. He has never used smokeless tobacco. He reports that he does not drink alcohol and does not use drugs.  Outpatient Medications Prior to Visit  Medication Sig Dispense Refill   albuterol (VENTOLIN HFA) 108 (90 Base) MCG/ACT inhaler Inhale 2 puffs into the lungs every 6 (six) hours as needed for wheezing or shortness of breath. 8 g 1   amLODipine (NORVASC) 10 MG tablet TAKE 1 TABLET(10 MG) BY MOUTH DAILY 90 tablet 1   cyanocobalamin 1000 MCG tablet Take 100 mcg by mouth daily.     dicyclomine (BENTYL) 20 MG tablet Take 1 tablet (20 mg total) by mouth 3 (three) times daily before meals. 90 tablet 0   glipiZIDE (GLUCOTROL XL) 5 MG 24 hr tablet Take 2 tablets (10 mg total) by mouth daily with breakfast. 180 tablet 1   glucose blood (ONETOUCH VERIO) test strip Test sugars twice daily 50 each 6   metFORMIN (GLUCOPHAGE) 850 MG tablet Take 1 tablet (850 mg total) by mouth 2 (two) times daily with a meal. 180 tablet 0   ONETOUCH DELICA LANCETS 09T MISC Test sugars twice daily 50 each 3   pantoprazole (PROTONIX) 40 MG tablet TAKE 1 TABLET(40 MG) BY MOUTH DAILY 90 tablet 1   Plecanatide (TRULANCE) 3 MG TABS Take 3 mg by mouth daily. 30 tablet 5   potassium chloride (KLOR-CON) 10 MEQ tablet Take 1 tablet (10 mEq total)  by mouth daily. 14 tablet 0   rosuvastatin (CRESTOR) 40 MG tablet Take 1 tablet (40 mg total) by mouth at bedtime. 90 tablet 1   sildenafil (REVATIO) 20 MG tablet Take 1 tablet (20 mg total) by mouth 3 (three) times daily. 30 tablet 5   No facility-administered medications prior to visit.    Review of Systems  Patient denies headache, fevers, malaise, unintentional weight loss, skin rash, eye pain, sinus congestion and sinus pain, sore throat, dysphagia,  hemoptysis , cough, dyspnea, wheezing, chest pain, palpitations, orthopnea, edema, abdominal pain, nausea, melena, diarrhea, constipation, flank pain, dysuria, hematuria, urinary  Frequency, nocturia, numbness, tingling, seizures,  Focal weakness, Loss of consciousness,  Tremor, insomnia, depression, anxiety, and suicidal ideation.     Objective:  BP 126/84   Pulse 87   Temp (!) 96.4 F (35.8 C)   Ht 6\' 1"  (1.854 m)   Wt 201 lb 9.6 oz (91.4 kg)   SpO2 96%   BMI 26.60 kg/m   Physical Exam  General appearance: alert, cooperative and appears stated age Ears: normal TM's and external ear canals both ears Throat: lips, mucosa, and tongue normal; teeth and gums normal Neck: no adenopathy, no carotid bruit, supple, symmetrical, trachea midline and thyroid not enlarged, symmetric, no tenderness/mass/nodules Back: symmetric, no curvature. ROM normal. No CVA tenderness. Lungs: clear to  auscultation bilaterally Heart: regular rate and rhythm, S1, S2 normal, no murmur, click, rub or gallop Abdomen: soft, non-tender; bowel sounds normal; no masses,  no organomegaly Pulses: 2+ and symmetric Skin: Skin color, texture, turgor normal. No rashes or lesions Lymph nodes: Cervical, supraclavicular, and axillary nodes normal.   Assessment & Plan:   Problem List Items Addressed This Visit       Unprioritized   Chronic right shoulder pain - Primary   Relevant Medications   celecoxib (CELEBREX) 200 MG capsule   Other Relevant Orders    Ambulatory referral to Sports Medicine   Current smoker    Still smoking one pack daily.  Risks of continued tobacco use were discussed. he is not currently interested in tobacco cessation.       Dyslipidemia with low high density lipoprotein (HDL) cholesterol with hypertriglyceridemia due to type 2 diabetes mellitus ([facility])    Tolerating Crestor 40 mg daily with reduction in triglycerides.  Reinforced need to continue medication and quit smoking  Lab Results  Component Value Date   CHOL 192 08/21/2020   HDL 28 (L) 08/21/2020   LDLCALC 133 (H) 08/21/2020   LDLDIRECT 176.0 10/18/2018   TRIG 170 (H) 08/21/2020   CHOLHDL 6.9 (H) 08/21/2020         Encounter for preventive health examination    age appropriate education and counseling updated, referrals for preventative services and immunizations addressed, dietary and smoking counseling addressed, most recent labs reviewed.  I have personally reviewed and have noted:   1) the patient's medical and social history 2) The pt's use of alcohol, tobacco, and illicit drugs 3) The patient's current medications and supplements 4) Functional ability including ADL's, fall risk, home safety risk, hearing and visual impairment 5) Diet and physical activities 6) Evidence for depression or mood disorder 7) The patient's height, weight, and BMI have been recorded in the chart   I have made referrals, and provided counseling and education based on review of the above      Gastritis due to nonsteroidal anti-inflammatory drug (NSAID)    He is currently taking 1000 mg ibuprofen daily for shoulder pain without gastritis.  Advised to change to  celebrex prn and use tylenol  1000 mg bid.  .continue  protonix       Other Visit Diagnoses     Prostate cancer screening       Relevant Orders   PSA   Tobacco use disorder, continuous       Relevant Orders   Ambulatory referral to Smoking Cessation Program       I am having Shanon Brow A. Winkels start on  celecoxib. I am also having him maintain his OneTouch Delica Lancets 97J, glucose blood, cyanocobalamin, albuterol, rosuvastatin, potassium chloride, Trulance, dicyclomine, pantoprazole, sildenafil, metFORMIN, glipiZIDE, and amLODipine.  Meds ordered this encounter  Medications   celecoxib (CELEBREX) 200 MG capsule    Sig: Take 1 capsule (200 mg total) by mouth 2 (two) times daily.    Dispense:  60 capsule    Refill:  0    There are no discontinued medications.  Follow-up: No follow-ups on file.   [name], MD

## 2020-10-21 NOTE — Assessment & Plan Note (Signed)
Still smoking one pack daily.  Risks of continued tobacco use were discussed. he is not currently interested in tobacco cessation.

## 2020-10-21 NOTE — Patient Instructions (Addendum)
STOP THE IBUPROFEN  ONCE YOU START THE CELEBREX.   You can add up to 2000 mg of acetominophen (tylenol) every day safely  In divided doses (500 mg every 6 hours  Or 1000 mg every 12 hours.)   Referral to Hulan Saas MD (sports medicine)

## 2020-10-21 NOTE — Assessment & Plan Note (Signed)
He is currently taking 1000 mg ibuprofen daily for shoulder pain without gastritis.  Advised to change to  celebrex prn and use tylenol  1000 mg bid.  .continue  protonix

## 2020-10-21 NOTE — Assessment & Plan Note (Addendum)
Tolerating Crestor 40 mg daily with reduction in triglycerides.  Reinforced need to continue medication and quit smoking  Lab Results  Component Value Date   CHOL 192 08/21/2020   HDL 28 (L) 08/21/2020   LDLCALC 133 (H) 08/21/2020   LDLDIRECT 176.0 10/18/2018   TRIG 170 (H) 08/21/2020   CHOLHDL 6.9 (H) 08/21/2020

## 2020-10-21 NOTE — Assessment & Plan Note (Signed)

## 2020-10-22 LAB — PSA: PSA: 0.72 ng/mL (ref 0.10–4.00)

## 2020-11-09 ENCOUNTER — Other Ambulatory Visit: Payer: Self-pay | Admitting: "Endocrinology

## 2020-11-10 ENCOUNTER — Other Ambulatory Visit: Payer: Self-pay | Admitting: Internal Medicine

## 2020-11-10 DIAGNOSIS — E1165 Type 2 diabetes mellitus with hyperglycemia: Secondary | ICD-10-CM

## 2020-11-19 ENCOUNTER — Other Ambulatory Visit: Payer: Self-pay

## 2020-11-19 ENCOUNTER — Ambulatory Visit: Payer: Self-pay

## 2020-11-19 ENCOUNTER — Encounter: Payer: Self-pay | Admitting: Sports Medicine

## 2020-11-19 ENCOUNTER — Ambulatory Visit: Payer: BC Managed Care – PPO | Admitting: Family Medicine

## 2020-11-19 ENCOUNTER — Ambulatory Visit: Payer: BC Managed Care – PPO | Admitting: Sports Medicine

## 2020-11-19 ENCOUNTER — Other Ambulatory Visit: Payer: Self-pay | Admitting: Internal Medicine

## 2020-11-19 VITALS — BP 138/88 | HR 91 | Ht 73.0 in | Wt 199.0 lb

## 2020-11-19 DIAGNOSIS — M25511 Pain in right shoulder: Secondary | ICD-10-CM

## 2020-11-19 DIAGNOSIS — G8929 Other chronic pain: Secondary | ICD-10-CM

## 2020-11-19 DIAGNOSIS — M7501 Adhesive capsulitis of right shoulder: Secondary | ICD-10-CM

## 2020-11-19 NOTE — Patient Instructions (Addendum)
Referral to physical therapy  See me again in 4 weeks

## 2020-11-19 NOTE — Progress Notes (Signed)
Jacob Baker D.[place] Phone: [phone]   Assessment and Plan:     1. Chronic right shoulder pain 2. Adhesive capsulitis of right shoulder -Chronic with exacerbation, initial sports medicine visit - Patient likely experiencing adhesive capsulitis with initial irritation 2 years ago, and reevaluation 6 months ago that is progressed to its current state as well as PMH DM type II - Patient elected for large-volume subacromial CSI with goal of breaking scar tissue.  Tolerated well per procedure note below - Stressed the importance of HEP and starting physical therapy to help reverse adhesive capsulitis process - Do not recommend lifting more than 15 pounds with right arm.  Work note provided - Ambulatory referral to Physical Therapy   Procedure: Subacromial Injection Side: Right  Risks explained and consent was given verbally. The site was cleaned with alcohol prep. A steroid injection was performed from posterior approach using 8 mL of 1% lidocaine without epinephrine and 12mL of kenalog 40mg /ml. This was well tolerated and resulted in symptomatic relief.  Needle was removed, hemostasis achieved, and post injection instructions were explained.   Pt was advised to call or return to clinic if these symptoms worsen or fail to improve as anticipated.   Pertinent previous records reviewed include x-ray from 06/2018   Follow Up: In 4 weeks for reevaluation.  Could consider repeat CSI to help further break up scar tissue at that time and ultrasound   Subjective:   I, Jacob Baker, am serving as a scribe for Dr Jacob Baker.  This visit occurred during the SARS-CoV-2 public health emergency.  Safety protocols were in place, including screening questions prior to the visit, additional usage of staff PPE, and extensive cleaning of exam room while observing appropriate contact time as indicated for disinfecting solutions.    Chief Complaint: Right shoulder pain   HPI:   11/19/20 Patient is a 50 year old male presenting with right shoulder pain that has been going on since 2020. Patient was seen by an orthopedist and right shoulder was injected, it was recommended that patient get an MRI and start PT. Patient had to decline due to cost. Patient had relief from the injection for awhile but has now been intermittently bothering him. Patient is having a hard time sleeping due to the pain, pain is worse with adduction. 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 Patient is trying ibuprofen daily 100mg  at the most. Patient was seen by PCP on 09/2620 and was prescribed Celebrex, to stop ibuprofen, take Tylenol and referred to sports med for evaluation. Last right shoulder X ray was 07/25/2018.   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 Pain in anterior aspect for about 6 months now. No acute injury to area. Denies any radiating symptoms. Hard to put on shirt, pull seatbelt across body. No relief with IBU or Celebrex.   Relevant Historical Information: Hypertension, DM type II  Additional pertinent review of systems negative.   Current Outpatient Medications:    albuterol (VENTOLIN HFA) 108 (90 Base) MCG/ACT inhaler, Inhale 2 puffs into the lungs every 6 (six) hours as needed for wheezing or shortness of breath., Disp: 8 g, Rfl: 1   amLODipine (NORVASC) 10 MG tablet, TAKE 1 TABLET(10 MG) BY MOUTH DAILY, Disp: 90 tablet, Rfl: 1   celecoxib (CELEBREX) 200 MG capsule,  TAKE 1 CAPSULE(200 MG) BY MOUTH TWICE DAILY, Disp: 60 capsule, Rfl: 0   cyanocobalamin 1000 MCG tablet, Take 100 mcg by mouth daily., Disp: , Rfl:    dicyclomine (BENTYL) 20 MG tablet, Take 1 tablet (20 mg total) by mouth 3 (three) times daily before meals., Disp: 90 tablet, Rfl: 0   glipiZIDE (GLUCOTROL XL) 5 MG 24 hr tablet, Take 2 tablets (10 mg  total) by mouth daily with breakfast., Disp: 180 tablet, Rfl: 1   glucose blood (ONETOUCH VERIO) test strip, Test sugars twice daily, Disp: 50 each, Rfl: 6   metFORMIN (GLUCOPHAGE) 850 MG tablet, TAKE 1 TABLET(850 MG) BY MOUTH TWICE DAILY WITH A MEAL., Disp: 180 tablet, Rfl: 1   ONETOUCH DELICA LANCETS 54O MISC, Test sugars twice daily, Disp: 50 each, Rfl: 3   pantoprazole (PROTONIX) 40 MG tablet, TAKE 1 TABLET(40 MG) BY MOUTH DAILY, Disp: 90 tablet, Rfl: 1   Plecanatide (TRULANCE) 3 MG TABS, Take 3 mg by mouth daily., Disp: 30 tablet, Rfl: 5   potassium chloride (KLOR-CON) 10 MEQ tablet, Take 1 tablet (10 mEq total) by mouth daily., Disp: 14 tablet, Rfl: 0   rosuvastatin (CRESTOR) 40 MG tablet, Take 1 tablet (40 mg total) by mouth at bedtime., Disp: 90 tablet, Rfl: 1   sildenafil (REVATIO) 20 MG tablet, Take 1 tablet (20 mg total) by mouth 3 (three) times daily., Disp: 30 tablet, Rfl: 5   Objective:     Vitals:   11/19/20 1454  BP: 138/88  Pulse: 91  SpO2: 97%  Weight: 199 lb (90.3 kg)  Height: 6\' 1"  (1.854 m)      Body mass index is 26.25 kg/m.    Physical Exam:    Gen: Appears well, nad, nontoxic and pleasant Neuro:sensation intact, strength is 5/5 with df/pf/inv/ev, muscle tone wnl Skin: no suspicious lesion or defmority Psych: A&O, appropriate mood and affect  Right shoulder: no deformity, swelling or muscle wasting No scapular winging FF 80, abd 60, int 20, ext 60 TTP over anterior and lateral shoulder musculature NTTP over the , clavicle, ac, coracoid, biceps groove, humerus, trapezius, cervical spine Special tests difficult to perform due to limited ROM Negative speeds Neg ant drawer, sulcus sign, apprehension Negative Spurling's test bilat FROM of neck    Electronically signed by:  Jacob Baker D.[facility] 4:44 PM 11/19/20

## 2020-12-10 ENCOUNTER — Other Ambulatory Visit: Payer: Self-pay

## 2020-12-10 ENCOUNTER — Encounter (HOSPITAL_COMMUNITY): Payer: Self-pay | Admitting: Occupational Therapy

## 2020-12-10 ENCOUNTER — Ambulatory Visit (HOSPITAL_COMMUNITY): Payer: BC Managed Care – PPO | Attending: Sports Medicine | Admitting: Occupational Therapy

## 2020-12-10 DIAGNOSIS — M25511 Pain in right shoulder: Secondary | ICD-10-CM | POA: Diagnosis not present

## 2020-12-10 DIAGNOSIS — R29898 Other symptoms and signs involving the musculoskeletal system: Secondary | ICD-10-CM | POA: Diagnosis not present

## 2020-12-10 DIAGNOSIS — M25611 Stiffness of right shoulder, not elsewhere classified: Secondary | ICD-10-CM | POA: Diagnosis not present

## 2020-12-10 DIAGNOSIS — G8929 Other chronic pain: Secondary | ICD-10-CM | POA: Insufficient documentation

## 2020-12-10 NOTE — Patient Instructions (Signed)
  1) Flexion Wall Stretch    Face wall, place affected handon wall in front of you. Slide hand up the wall  and lean body in towards the wall. Hold for 10 seconds. Repeat 3-5 times. 1-2 times/day.     2) Towel Stretch with Internal Rotation       Gently pull up (or to the side) your affected arm  behind your back with the assist of a towel. Hold 10 seconds, repeat 3-5 times. 1-2 times/day.             3) Corner Stretch    Stand at a corner of a wall, place your arms on the walls with elbows bent. Lean into the corner until a stretch is felt along the front of your chest and/or shoulders. Hold for 10 seconds. Repeat 3-5X, 1-2 times/day.    4) Posterior Capsule Stretch    Bring the involved arm across chest. Grasp elbow and pull toward chest until you feel a stretch in the back of the upper arm and shoulder. Hold 10 seconds. Repeat 3-5X. Complete 1-2 times/day.    5) Scapular Retraction    Tuck chin back as you pinch shoulder blades together.  Hold 5 seconds. Repeat 3-5X. Complete 1-2 times/day.    6) External Rotation Stretch:     Place your affected hand on the wall with the elbow bent and gently turn your body the opposite direction until a stretch is felt. Hold 10 seconds, repeat 3-5X. Complete 1-2 times/day.    7) Shoulder Abduction Stretch     Stand beside wall place hand on wall beside you. Slide hand up the wall  and lean body in towards the wall. Hold for 10 seconds. Repeat 3-5 times. 1-2 times/day.

## 2020-12-10 NOTE — Therapy (Signed)
[place], [place], [address] Phone: [phone]   Fax:  [phone]  Occupational Therapy Evaluation  Patient Details  Name: Jacob Baker MRN: [id number] Date of Birth: [birth year] Referring Provider (OT): Dr. Glennon Mac   Encounter Date: 12/10/2020   OT End of Session - 12/10/20 1557     Visit Number 1    Number of Visits 5    Date for OT Re-Evaluation 01/09/21    Authorization Type BCBS; $40 copay    OT Start Time 1513    OT Stop Time 1547    OT Time Calculation (min) 34 min    Activity Tolerance Patient tolerated treatment well    Behavior During Therapy [facility] for tasks assessed/performed             Past Medical History:  Diagnosis Date   CHF (congestive heart failure) ([place])    Diabetes mellitus without complication ([place])    DVT (deep venous thrombosis) ([place]) 04/2017   GERD (gastroesophageal reflux disease)    History of migraine    none in over 2 yrs   Hyperlipidemia    Hypertension    Syncope and collapse    x1 - approx 2-3 yrs ago - dehydration    Past Surgical History:  Procedure Laterality Date   COLONOSCOPY     COLONOSCOPY WITH PROPOFOL N/A 08/06/2017   Procedure: COLONOSCOPY WITH PROPOFOL;  Surgeon: Lucilla Lame, MD;  Location: [place];  Service: Endoscopy;  Laterality: N/A;  Diabetic - oral meds   ESOPHAGEAL DILATION  09/24/2016   Procedure: ESOPHAGEAL DILATION;  Surgeon: Lucilla Lame, MD;  Location: [place];  Service: Gastroenterology;;   ESOPHAGOGASTRODUODENOSCOPY (EGD) WITH PROPOFOL N/A 04/08/2015   Procedure: ESOPHAGOGASTRODUODENOSCOPY (EGD) WITH PROPOFOL with dialation;  Surgeon: Lucilla Lame, MD;  Location: [place];  Service: Endoscopy;  Laterality: N/A;  diabetic - oral meds   ESOPHAGOGASTRODUODENOSCOPY (EGD) WITH PROPOFOL N/A 09/24/2016   Procedure: ESOPHAGOGASTRODUODENOSCOPY (EGD) WITH PROPOFOL WITH DILATION;  Surgeon: Lucilla Lame, MD;   Location: [place];  Service: Gastroenterology;  Laterality: N/A;  Diabetic - oral meds   FINGER AMPUTATION  2009   partial removal left index finger    POLYPECTOMY  08/06/2017   Procedure: POLYPECTOMY INTESTINAL;  Surgeon: Lucilla Lame, MD;  Location: [place];  Service: Endoscopy;;    There were no vitals filed for this visit.   Subjective Assessment - 12/10/20 1548     Subjective  S: It's not hurting too bad today.    Pertinent History Pt is a 50 y/o male presenting with right shoulder pain that began approximately 2 years ago, improved, then worsened again approximately 6 months ago. Pt was dx of adhesive capsulitis. Pt was referred to occupational therapy for evaluation and treatment by Dr. Glennon Mac.    Special Tests FOTO: 64/100    Patient Stated Goals To have less pain.    Currently in Pain? Yes    Pain Score 3     Pain Location Shoulder    Pain Orientation Right    Pain Descriptors / Indicators Aching;Sore    Pain Type Chronic pain    Pain Radiating Towards N/A    Pain Onset More than a month ago    Pain Frequency Intermittent    Aggravating Factors  movement    Pain Relieving Factors OTC pain medication    Effect of Pain on Daily Activities mod effect on ADLs    Multiple Pain  Sites No               [facility] OT Assessment - 12/10/20 1513       Assessment   Medical Diagnosis right shoulder adhesive capsulities    Referring Provider (OT) Dr. Glennon Mac    Onset Date/Surgical Date --   approximately 2 years ago with worsening 6 months ago   Hand Dominance Right    Next MD Visit 12/24/2020    Prior Therapy None      Precautions   Precautions None      Restrictions   Weight Bearing Restrictions No      Balance Screen   Has the patient fallen in the past 6 months No      Prior Function   Level of Independence Independent    Vocation Full time employment    Vocation Requirements driving    Leisure watching tv      ADL    ADL comments Pt is having difficulty with dressing, bathing, reaching behind back and overhead.      Written Expression   Dominant Hand Right      Cognition   Overall Cognitive Status Within Functional Limits for tasks assessed      Observation/Other Assessments   Focus on Therapeutic Outcomes (FOTO)  64/100      ROM / Strength   AROM / PROM / Strength AROM;PROM;Strength      Palpation   Palpation comment min/mod fascial restrictions at anterior shoulder and trapezius regions      AROM   Overall AROM Comments Assessed seated, er/IR adducted    AROM Assessment Site Shoulder    Right/Left Shoulder Right    Right Shoulder Flexion 120 Degrees    Right Shoulder ABduction 132 Degrees    Right Shoulder Internal Rotation 90 Degrees    Right Shoulder External Rotation 46 Degrees      PROM   Overall PROM Comments Assessed supine, er/IR adducted    PROM Assessment Site Shoulder    Right/Left Shoulder Right    Right Shoulder Flexion 154 Degrees    Right Shoulder ABduction 150 Degrees    Right Shoulder Internal Rotation 90 Degrees    Right Shoulder External Rotation 65 Degrees      Strength   Overall Strength Comments Assessed seated, er/IR adducted    Strength Assessment Site Shoulder    Right/Left Shoulder Right    Right Shoulder Flexion 5/5    Right Shoulder ABduction 5/5    Right Shoulder Internal Rotation 5/5    Right Shoulder External Rotation 5/5                              OT Education - 12/10/20 1546     Education Details shoulder stretches    Person(s) Educated Patient    Methods Explanation;Demonstration;Handout    Comprehension Verbalized understanding;Returned demonstration              OT Short Term Goals - 12/10/20 1600       OT SHORT TERM GOAL #1   Title Pt will be provided with and educated on HEP to improve mobility in RUE required for ADL completion.    Time 4    Period Weeks    Status New    Target Date 01/09/21      OT  SHORT TERM GOAL #2   Title Pt will decrease RUE fascial restrictions to minimal amounts to improve ability to  perform functional reaching tasks.    Time 4    Period Weeks    Status New      OT SHORT TERM GOAL #3   Title Pt will increase RUE A/ROM to [facility] to improve ability to reach overhead and behind back during dressing and bathing tasks.    Time 4    Period Weeks    Status New      OT SHORT TERM GOAL #4   Title Pt will decrease pain in RUE to 3/10 or less to improve ability to use RUE as dominant during reaching and ADL task completion.    Time 4    Period Weeks    Status New                      Plan - 12/10/20 1557     Clinical Impression Statement A: Pt is a 49 y/o male presenting with right shoulder adhesive capsulits limiting use of dominant RUE during ADLs and reaching tasks. Pt with improvement during evaluation after passive stretching and with shoulder stretches. Pt completing stretches with good form. Recommend 1x/week with HEP completion between visits to which pt is agreeable.    OT Occupational Profile and History Problem Focused Assessment - Including review of records relating to presenting problem    Occupational performance deficits (Please refer to evaluation for details): ADL's;IADL's;Leisure;Work    Marketing executive / Function / Physical Skills ADL;Endurance;Muscle spasms;UE functional use;Fascial restriction;Pain;ROM;IADL;Strength    Rehab Potential Good    Clinical Decision Making Limited treatment options, no task modification necessary    Comorbidities Affecting Occupational Performance: None    Modification or Assistance to Complete Evaluation  No modification of tasks or assist necessary to complete eval    OT Frequency 1x / week    OT Duration 4 weeks    OT Treatment/Interventions Self-care/ADL training;Ultrasound;DME and/or AE instruction;Patient/family education;Passive range of motion;Cryotherapy;Electrical Stimulation;Moist Heat;Therapeutic  exercise;Manual Therapy;Therapeutic activities    Plan P: Pt will benefit from skilled OT services to decrease pain and fascial restrictions and improve joint ROM, strength, and functional use during ADLs. Treatment plan: myofascial release and manual techniques, P/ROM, AA/ROM, A/ROM, scapular mobility/stability/strengthening, general RUE strengthening, modalities prn    OT Home Exercise Plan 11/15: shoulder stretches             Patient will benefit from skilled therapeutic intervention in order to improve the following deficits and impairments:   Body Structure / Function / Physical Skills: ADL, Endurance, Muscle spasms, UE functional use, Fascial restriction, Pain, ROM, IADL, Strength       Visit Diagnosis: Chronic right shoulder pain  Stiffness of right shoulder, not elsewhere classified  Other symptoms and signs involving the musculoskeletal system    Problem List Patient Active Problem List   Diagnosis Date Noted   Chronic right shoulder pain 10/21/2020   Uncontrolled type 2 diabetes mellitus with hyperglycemia ([place]) 10/26/2018   Mixed hyperlipidemia 10/26/2018   Chronic idiopathic constipation    Change in bowel habits    Polyp of sigmoid colon    Rectal polyp    Chronic pain of right hip 10/15/2015   Neuropathy 07/24/2015   Essential hypertension 07/24/2015   Dyslipidemia with low high density lipoprotein (HDL) cholesterol with hypertriglyceridemia due to type 2 diabetes mellitus ([place]) 07/24/2015   Encounter for preventive health examination 10/26/2014   Pituitary insufficiency ([place]) 09/12/2014   Low testosterone 09/11/2014   Onychomycosis 04/18/2014   Hyperlipidemia associated with type 2 diabetes mellitus (  [place]) 11/26/2013   Current smoker 11/24/2013   Hiatal hernia 11/15/2013   Tubular adenoma of colon 47/82/9562   History of Helicobacter pylori infection 10/18/2013   Gastritis due to nonsteroidal anti-inflammatory drug (NSAID) 10/14/2013   HTN  (hypertension) 04/26/2013   Anxiety 04/26/2013   Chest pain 10/12/2011    Guadelupe Sabin, OTR/L  [phone] 12/10/2020, 4:02 PM  [place], [place], [address] Phone: [phone]   Fax:  [phone]  Name: Jacob Baker MRN: [id number] Date of Birth: 26-Aug-1970

## 2020-12-12 ENCOUNTER — Ambulatory Visit (HOSPITAL_COMMUNITY): Payer: BC Managed Care – PPO | Admitting: Occupational Therapy

## 2020-12-17 ENCOUNTER — Other Ambulatory Visit: Payer: Self-pay | Admitting: *Deleted

## 2020-12-17 ENCOUNTER — Ambulatory Visit (HOSPITAL_COMMUNITY): Payer: BC Managed Care – PPO | Admitting: Occupational Therapy

## 2020-12-17 DIAGNOSIS — F17210 Nicotine dependence, cigarettes, uncomplicated: Secondary | ICD-10-CM

## 2020-12-17 DIAGNOSIS — Z87891 Personal history of nicotine dependence: Secondary | ICD-10-CM

## 2020-12-23 NOTE — Progress Notes (Signed)
Jacob Baker D.[place] Phone: [phone]   Assessment and Plan:     1. Chronic right shoulder pain 2. Adhesive capsulitis of right shoulder -Chronic with exacerbation, subsequent sports medicine visit - Mild improvement in range of motion and strength with subacromial CSI and physical therapy since last visit, though little change in pain - Patient elected for intra-articular right shoulder CSI.  Tolerated well per note below - Continue physical therapy and HEP - May use Tylenol/NSAIDs as needed for pain control - Do not recommend lifting more than 15 pounds with right arm - [place] LIMITED JOINT SPACE STRUCTURES UP RIGHT(NO LINKED CHARGES); Future   Procedure: Ultrasound Guided Glenohumeral Joint Injection Side: Right Diagnosis: Adhesive capsulitis [place] Indication:  - accuracy is paramount for diagnosis - to ensure therapeutic efficacy or procedural success - to reduce procedural risk  After PARQ discussed and consent was given verbally. The site was cleaned with chlorhexidine prep. An ultrasound transducer was placed on the posterior shoulder.  The posterior capsule, labrum, and infraspinatus were identified.  A steroid injection was performed under ultrasound guidance with sterile technique using  30ml of 1% lidocaine without epinephrine and 80 mg of triamcinolone (KENALOG) 40mg /ml. This was well tolerated and resulted in relief.  Needle was removed and dressing placed and post injection instructions were given including  a discussion of likely return of pain today after the anesthetic wears off (with the possibility of worsened pain) until the steroid starts to work in 1-3 days.   Pt was advised to call or return to clinic if these symptoms worsen or fail to improve as anticipated.    Pertinent previous records reviewed include PT note from 12/24/2020   Follow Up: 2 weeks for reevaluation.  Could consider 1  additional CSI at that time.  If no improvement or worsening of symptoms would also consider shoulder MRI   Subjective:   I, Jacob Baker, am serving as a scribe for Dr. Glennon Mac  Chief Complaint: right shoulder pain follow up   HPI:   11/19/20 Patient is a 50 year old male presenting with right shoulder pain that has been going on since 2020. Patient was seen by an orthopedist and right shoulder was injected, it was recommended that patient get an MRI and start PT. Patient had to decline due to cost. Patient had relief from the injection for awhile but has now been intermittently bothering him. Patient is having a hard time sleeping due to the pain, pain is worse with adduction. 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 Patient is trying ibuprofen daily 100mg  at the most. Patient was seen by PCP on 09/2620 and was prescribed Celebrex, to stop ibuprofen, take Tylenol and referred to sports med for evaluation. Last right shoulder X ray was 07/25/2018.    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 Pain in anterior aspect for about 6 months now. No acute injury to area. Denies any radiating symptoms. Hard to put on shirt, pull seatbelt across body. No relief with IBU or Celebrex.   12/24/20 Patient states that he just had PT today so his shoulder is pretty sore. Pain has not gotten worse and still in the same location. Patient states PT will make him sore for a couple days but will feel better after a couple days.  Relevant Historical Information: Hypertension, DM type II  Additional pertinent review of systems negative.   Current Outpatient Medications:    albuterol (VENTOLIN HFA) 108 (90 Base) MCG/ACT inhaler, Inhale 2 puffs into the lungs every 6 (six) hours as needed for wheezing or shortness of breath., Disp: 8 g, Rfl: 1   amLODipine (NORVASC) 10 MG tablet, TAKE 1  TABLET(10 MG) BY MOUTH DAILY, Disp: 90 tablet, Rfl: 1   celecoxib (CELEBREX) 200 MG capsule, TAKE 1 CAPSULE(200 MG) BY MOUTH TWICE DAILY, Disp: 60 capsule, Rfl: 0   cyanocobalamin 1000 MCG tablet, Take 100 mcg by mouth daily., Disp: , Rfl:    dicyclomine (BENTYL) 20 MG tablet, Take 1 tablet (20 mg total) by mouth 3 (three) times daily before meals., Disp: 90 tablet, Rfl: 0   glipiZIDE (GLUCOTROL XL) 5 MG 24 hr tablet, Take 2 tablets (10 mg total) by mouth daily with breakfast., Disp: 180 tablet, Rfl: 1   glucose blood (ONETOUCH VERIO) test strip, Test sugars twice daily, Disp: 50 each, Rfl: 6   metFORMIN (GLUCOPHAGE) 850 MG tablet, TAKE 1 TABLET(850 MG) BY MOUTH TWICE DAILY WITH A MEAL., Disp: 180 tablet, Rfl: 1   ONETOUCH DELICA LANCETS 09B MISC, Test sugars twice daily, Disp: 50 each, Rfl: 3   pantoprazole (PROTONIX) 40 MG tablet, TAKE 1 TABLET(40 MG) BY MOUTH DAILY, Disp: 90 tablet, Rfl: 1   Plecanatide (TRULANCE) 3 MG TABS, Take 3 mg by mouth daily., Disp: 30 tablet, Rfl: 5   potassium chloride (KLOR-CON) 10 MEQ tablet, Take 1 tablet (10 mEq total) by mouth daily., Disp: 14 tablet, Rfl: 0   rosuvastatin (CRESTOR) 40 MG tablet, Take 1 tablet (40 mg total) by mouth at bedtime., Disp: 90 tablet, Rfl: 1   sildenafil (REVATIO) 20 MG tablet, Take 1 tablet (20 mg total) by mouth 3 (three) times daily., Disp: 30 tablet, Rfl: 5   Objective:     Vitals:   12/24/20 1510  BP: 140/80  Pulse: 92  SpO2: 96%  Weight: 195 lb (88.5 kg)  Height: 6\' 1"  (1.854 m)      Body mass index is 25.73 kg/m.    Physical Exam:    Gen: Appears well, nad, nontoxic and pleasant Neuro:sensation intact, strength is 5/5 with df/pf/inv/ev, muscle tone wnl Skin: no suspicious lesion or defmority Psych: A&O, appropriate mood and affect  Right shoulder: no deformity, swelling or muscle wasting No scapular winging FF 150, abd 150, int 15, ext 70 TTP over anterior and lateral shoulder musculature NTTP over the [place],  clavicle, ac, coracoid, biceps groove, humerus, trapezius, cervical spine Negative O'Brien, crossarm, empty can Negative speeds Neg ant drawer, sulcus sign, apprehension Negative Spurling's test bilat FROM of neck    Electronically signed by:  Jacob Baker D.[facility] 3:42 PM 12/24/20

## 2020-12-24 ENCOUNTER — Other Ambulatory Visit: Payer: Self-pay

## 2020-12-24 ENCOUNTER — Ambulatory Visit: Payer: BC Managed Care – PPO | Admitting: Sports Medicine

## 2020-12-24 ENCOUNTER — Encounter (HOSPITAL_COMMUNITY): Payer: Self-pay | Admitting: Occupational Therapy

## 2020-12-24 ENCOUNTER — Ambulatory Visit: Payer: Self-pay

## 2020-12-24 ENCOUNTER — Ambulatory Visit (HOSPITAL_COMMUNITY): Payer: BC Managed Care – PPO | Admitting: Occupational Therapy

## 2020-12-24 VITALS — BP 140/80 | HR 92 | Ht 73.0 in | Wt 195.0 lb

## 2020-12-24 DIAGNOSIS — M25611 Stiffness of right shoulder, not elsewhere classified: Secondary | ICD-10-CM | POA: Diagnosis not present

## 2020-12-24 DIAGNOSIS — G8929 Other chronic pain: Secondary | ICD-10-CM

## 2020-12-24 DIAGNOSIS — M7501 Adhesive capsulitis of right shoulder: Secondary | ICD-10-CM

## 2020-12-24 DIAGNOSIS — M25511 Pain in right shoulder: Secondary | ICD-10-CM

## 2020-12-24 DIAGNOSIS — R29898 Other symptoms and signs involving the musculoskeletal system: Secondary | ICD-10-CM | POA: Diagnosis not present

## 2020-12-24 NOTE — Therapy (Signed)
[place], [place], [address] Phone: [phone]   Fax:  [phone]  Occupational Therapy Treatment  Patient Details  Name: Jacob Baker MRN: [id number] Date of Birth: December 05, 1970 Referring Provider (OT): Dr. Glennon Mac   Encounter Date: 12/24/2020   OT End of Session - 12/24/20 1415     Visit Number 2    Number of Visits 5    Date for OT Re-Evaluation 01/09/21    Authorization Type BCBS; $40 copay    OT Start Time 1343    OT Stop Time 1414   pt needed to leave a few minutes early for MD appt   OT Time Calculation (min) 31 min    Activity Tolerance Patient tolerated treatment well    Behavior During Therapy [facility] for tasks assessed/performed             Past Medical History:  Diagnosis Date   CHF (congestive heart failure) ([place])    Diabetes mellitus without complication ([place])    DVT (deep venous thrombosis) ([place]) 04/2017   GERD (gastroesophageal reflux disease)    History of migraine    none in over 2 yrs   Hyperlipidemia    Hypertension    Syncope and collapse    x1 - approx 2-3 yrs ago - dehydration    Past Surgical History:  Procedure Laterality Date   COLONOSCOPY     COLONOSCOPY WITH PROPOFOL N/A 08/06/2017   Procedure: COLONOSCOPY WITH PROPOFOL;  Surgeon: Lucilla Lame, MD;  Location: [place];  Service: Endoscopy;  Laterality: N/A;  Diabetic - oral meds   ESOPHAGEAL DILATION  09/24/2016   Procedure: ESOPHAGEAL DILATION;  Surgeon: Lucilla Lame, MD;  Location: [place];  Service: Gastroenterology;;   ESOPHAGOGASTRODUODENOSCOPY (EGD) WITH PROPOFOL N/A 04/08/2015   Procedure: ESOPHAGOGASTRODUODENOSCOPY (EGD) WITH PROPOFOL with dialation;  Surgeon: Lucilla Lame, MD;  Location: [place];  Service: Endoscopy;  Laterality: N/A;  diabetic - oral meds   ESOPHAGOGASTRODUODENOSCOPY (EGD) WITH PROPOFOL N/A 09/24/2016   Procedure: ESOPHAGOGASTRODUODENOSCOPY (EGD) WITH  PROPOFOL WITH DILATION;  Surgeon: Lucilla Lame, MD;  Location: [place];  Service: Gastroenterology;  Laterality: N/A;  Diabetic - oral meds   FINGER AMPUTATION  2009   partial removal left index finger    POLYPECTOMY  08/06/2017   Procedure: POLYPECTOMY INTESTINAL;  Surgeon: Lucilla Lame, MD;  Location: [place];  Service: Endoscopy;;    There were no vitals filed for this visit.   Subjective Assessment - 12/24/20 1339     Subjective  S: I haven't had time to do my exercises.    Currently in Pain? No/denies                [facility] OT Assessment - 12/24/20 1339       Assessment   Medical Diagnosis right shoulder adhesive capsulities      Precautions   Precautions None                      OT Treatments/Exercises (OP) - 12/24/20 1344       Exercises   Exercises Shoulder      Shoulder Exercises: Supine   Protraction PROM;5 reps;AROM;10 reps    Horizontal ABduction PROM;5 reps;AROM;10 reps    External Rotation PROM;5 reps;AROM;10 reps    Internal Rotation PROM;5 reps;AROM;10 reps    Flexion PROM;5 reps;AROM;10 reps    ABduction PROM;5 reps;AROM;10 reps      Shoulder Exercises: Standing  Extension Theraband;10 reps    Theraband Level (Shoulder Extension) Level 3 (Green)    Row Theraband;10 reps    Theraband Level (Shoulder Row) Level 3 (Green)    Retraction Theraband;10 reps    Theraband Level (Shoulder Retraction) Level 3 (Green)      Shoulder Exercises: Therapy Ball   Other Therapy Ball Exercises green therapy ball: flexion, circles each direction 10X      Shoulder Exercises: ROM/Strengthening   X to V Arms 10X A/ROM    Other ROM/Strengthening Exercises Y Lift Off, 10X      Shoulder Exercises: Stretch   Internal Rotation Stretch 2 reps   20" holds, vertical towel   Wall Stretch - Flexion 2 reps;20 seconds    Wall Stretch - ABduction 2 reps;20 seconds                      OT Short Term Goals - 12/24/20 1349        OT SHORT TERM GOAL #1   Title Pt will be provided with and educated on HEP to improve mobility in RUE required for ADL completion.    Time 4    Period Weeks    Status On-going    Target Date 01/09/21      OT SHORT TERM GOAL #2   Title Pt will decrease RUE fascial restrictions to minimal amounts to improve ability to perform functional reaching tasks.    Time 4    Period Weeks    Status On-going      OT SHORT TERM GOAL #3   Title Pt will increase RUE A/ROM to [facility] to improve ability to reach overhead and behind back during dressing and bathing tasks.    Time 4    Period Weeks    Status On-going      OT SHORT TERM GOAL #4   Title Pt will decrease pain in RUE to 3/10 or less to improve ability to use RUE as dominant during reaching and ADL task completion.    Time 4    Period Weeks    Status On-going                      Plan - 12/24/20 1350     Clinical Impression Statement A: Pt reports his shoulder feels about the same, he has not had time to complete his HEP due to working long hours. Short session today as pt has MD appt in [place] immediately after session. P/ROM completed, pt with good joint ROM, has painful catching at approximately 75% range at top and anterior shoulder. Pt completing A/ROM in supine, continues to have painful catching at front and top of shoulder near end ROM. Pt completing shoulder stretches, green scapular theraband, and therapy ball strengthening with good ROM achieved. Verbal cuing for form and technique.    Body Structure / Function / Physical Skills ADL;Endurance;Muscle spasms;UE functional use;Fascial restriction;Pain;ROM;IADL;Strength    Plan P: Follow up on MD appt, continue with shoulder ROM and strengthening work, add ball on wall; update HEP    OT Home Exercise Plan 11/15: shoulder stretches    Consulted and Agree with Plan of Care Patient             Patient will benefit from skilled therapeutic intervention in order  to improve the following deficits and impairments:   Body Structure / Function / Physical Skills: ADL, Endurance, Muscle spasms, UE functional use, Fascial restriction, Pain, ROM, IADL, Strength  Visit Diagnosis: Chronic right shoulder pain  Stiffness of right shoulder, not elsewhere classified  Other symptoms and signs involving the musculoskeletal system    Problem List Patient Active Problem List   Diagnosis Date Noted   Chronic right shoulder pain 10/21/2020   Uncontrolled type 2 diabetes mellitus with hyperglycemia ([place]) 10/26/2018   Mixed hyperlipidemia 10/26/2018   Chronic idiopathic constipation    Change in bowel habits    Polyp of sigmoid colon    Rectal polyp    Chronic pain of right hip 10/15/2015   Neuropathy 07/24/2015   Essential hypertension 07/24/2015   Dyslipidemia with low high density lipoprotein (HDL) cholesterol with hypertriglyceridemia due to type 2 diabetes mellitus ([place]) 07/24/2015   Encounter for preventive health examination 10/26/2014   Pituitary insufficiency ([place]) 09/12/2014   Low testosterone 09/11/2014   Onychomycosis 04/18/2014   Hyperlipidemia associated with type 2 diabetes mellitus ([place]) 11/26/2013   Current smoker 11/24/2013   Hiatal hernia 11/15/2013   Tubular adenoma of colon 03/88/8280   History of Helicobacter pylori infection 10/18/2013   Gastritis due to nonsteroidal anti-inflammatory drug (NSAID) 10/14/2013   HTN (hypertension) 04/26/2013   Anxiety 04/26/2013   Chest pain 10/12/2011    Guadelupe Sabin, OTR/L  [phone] 12/24/2020, 2:17 PM  [place], [place], [address] Phone: [phone]   Fax:  [phone]  Name: Jacob Baker MRN: [id number] Date of Birth: March 14, 1970

## 2020-12-24 NOTE — Patient Instructions (Addendum)
Good to see you Injection given in right shoulder today  Follow up in 2 weeks

## 2020-12-26 ENCOUNTER — Ambulatory Visit (HOSPITAL_COMMUNITY): Payer: BC Managed Care – PPO | Admitting: Occupational Therapy

## 2020-12-26 DIAGNOSIS — I214 Non-ST elevation (NSTEMI) myocardial infarction: Secondary | ICD-10-CM

## 2020-12-26 HISTORY — DX: Non-ST elevation (NSTEMI) myocardial infarction: I21.4

## 2020-12-30 ENCOUNTER — Emergency Department (HOSPITAL_COMMUNITY): Payer: BC Managed Care – PPO

## 2020-12-30 ENCOUNTER — Inpatient Hospital Stay (HOSPITAL_COMMUNITY)
Admission: EM | Admit: 2020-12-30 | Discharge: 2021-01-02 | DRG: 247 | Disposition: A | Discharge status: Home / Self Care | Payer: BC Managed Care – PPO | Attending: Cardiovascular Disease | Admitting: Cardiovascular Disease

## 2020-12-30 ENCOUNTER — Encounter (HOSPITAL_COMMUNITY): Payer: Self-pay

## 2020-12-30 ENCOUNTER — Other Ambulatory Visit: Payer: Self-pay

## 2020-12-30 DIAGNOSIS — J449 Chronic obstructive pulmonary disease, unspecified: Secondary | ICD-10-CM | POA: Diagnosis present

## 2020-12-30 DIAGNOSIS — K219 Gastro-esophageal reflux disease without esophagitis: Secondary | ICD-10-CM

## 2020-12-30 DIAGNOSIS — E782 Mixed hyperlipidemia: Secondary | ICD-10-CM | POA: Diagnosis present

## 2020-12-30 DIAGNOSIS — Z86718 Personal history of other venous thrombosis and embolism: Secondary | ICD-10-CM | POA: Diagnosis not present

## 2020-12-30 DIAGNOSIS — R079 Chest pain, unspecified: Secondary | ICD-10-CM | POA: Diagnosis present

## 2020-12-30 DIAGNOSIS — I252 Old myocardial infarction: Secondary | ICD-10-CM | POA: Diagnosis present

## 2020-12-30 DIAGNOSIS — Z79899 Other long term (current) drug therapy: Secondary | ICD-10-CM | POA: Diagnosis not present

## 2020-12-30 DIAGNOSIS — I214 Non-ST elevation (NSTEMI) myocardial infarction: Principal | ICD-10-CM | POA: Diagnosis present

## 2020-12-30 DIAGNOSIS — E1165 Type 2 diabetes mellitus with hyperglycemia: Secondary | ICD-10-CM | POA: Diagnosis not present

## 2020-12-30 DIAGNOSIS — F17210 Nicotine dependence, cigarettes, uncomplicated: Secondary | ICD-10-CM | POA: Diagnosis not present

## 2020-12-30 DIAGNOSIS — R778 Other specified abnormalities of plasma proteins: Secondary | ICD-10-CM

## 2020-12-30 DIAGNOSIS — Z20822 Contact with and (suspected) exposure to covid-19: Secondary | ICD-10-CM | POA: Diagnosis not present

## 2020-12-30 DIAGNOSIS — E230 Hypopituitarism: Secondary | ICD-10-CM | POA: Diagnosis not present

## 2020-12-30 DIAGNOSIS — Z8249 Family history of ischemic heart disease and other diseases of the circulatory system: Secondary | ICD-10-CM | POA: Diagnosis not present

## 2020-12-30 DIAGNOSIS — D72829 Elevated white blood cell count, unspecified: Secondary | ICD-10-CM | POA: Diagnosis not present

## 2020-12-30 DIAGNOSIS — E1169 Type 2 diabetes mellitus with other specified complication: Secondary | ICD-10-CM | POA: Diagnosis present

## 2020-12-30 DIAGNOSIS — K760 Fatty (change of) liver, not elsewhere classified: Secondary | ICD-10-CM | POA: Diagnosis not present

## 2020-12-30 DIAGNOSIS — Z89022 Acquired absence of left finger(s): Secondary | ICD-10-CM

## 2020-12-30 DIAGNOSIS — Z833 Family history of diabetes mellitus: Secondary | ICD-10-CM | POA: Diagnosis not present

## 2020-12-30 DIAGNOSIS — I10 Essential (primary) hypertension: Secondary | ICD-10-CM | POA: Diagnosis present

## 2020-12-30 DIAGNOSIS — Z86010 Personal history of colonic polyps: Secondary | ICD-10-CM | POA: Diagnosis not present

## 2020-12-30 DIAGNOSIS — E785 Hyperlipidemia, unspecified: Secondary | ICD-10-CM | POA: Diagnosis present

## 2020-12-30 DIAGNOSIS — Z7984 Long term (current) use of oral hypoglycemic drugs: Secondary | ICD-10-CM | POA: Diagnosis not present

## 2020-12-30 DIAGNOSIS — Z955 Presence of coronary angioplasty implant and graft: Secondary | ICD-10-CM | POA: Diagnosis not present

## 2020-12-30 DIAGNOSIS — I25119 Atherosclerotic heart disease of native coronary artery with unspecified angina pectoris: Secondary | ICD-10-CM | POA: Diagnosis present

## 2020-12-30 DIAGNOSIS — F17200 Nicotine dependence, unspecified, uncomplicated: Secondary | ICD-10-CM | POA: Diagnosis not present

## 2020-12-30 DIAGNOSIS — Z720 Tobacco use: Secondary | ICD-10-CM | POA: Diagnosis not present

## 2020-12-30 DIAGNOSIS — I2510 Atherosclerotic heart disease of native coronary artery without angina pectoris: Secondary | ICD-10-CM | POA: Diagnosis not present

## 2020-12-30 DIAGNOSIS — I25110 Atherosclerotic heart disease of native coronary artery with unstable angina pectoris: Secondary | ICD-10-CM | POA: Diagnosis not present

## 2020-12-30 DIAGNOSIS — Z87891 Personal history of nicotine dependence: Secondary | ICD-10-CM | POA: Diagnosis present

## 2020-12-30 LAB — CBC
HCT: 39.5 % (ref 39.0–52.0)
Hemoglobin: 14.4 g/dL (ref 13.0–17.0)
MCH: 29.4 pg (ref 26.0–34.0)
MCHC: 36.5 g/dL — ABNORMAL HIGH (ref 30.0–36.0)
MCV: 80.6 fL (ref 80.0–100.0)
Platelets: 313 10*3/uL (ref 150–400)
RBC: 4.9 MIL/uL (ref 4.22–5.81)
RDW: 13 % (ref 11.5–15.5)
WBC: 13.2 10*3/uL — ABNORMAL HIGH (ref 4.0–10.5)
nRBC: 0 % (ref 0.0–0.2)

## 2020-12-30 LAB — BASIC METABOLIC PANEL
Anion gap: 7 (ref 5–15)
BUN: 17 mg/dL (ref 6–20)
CO2: 24 mmol/L (ref 22–32)
Calcium: 8.8 mg/dL — ABNORMAL LOW (ref 8.9–10.3)
Chloride: 104 mmol/L (ref 98–111)
Creatinine, Ser: 0.85 mg/dL (ref 0.61–1.24)
GFR, Estimated: >60 mL/min (ref >60)
Glucose, Bld: 171 mg/dL — ABNORMAL HIGH (ref 70–99)
Potassium: 3.5 mmol/L (ref 3.5–5.1)
Sodium: 135 mmol/L (ref 135–145)

## 2020-12-30 LAB — RESP PANEL BY RT-PCR (FLU A&B, COVID) ARPGX2
Influenza A by PCR: NEGATIVE
Influenza B by PCR: NEGATIVE
SARS Coronavirus 2 by RT PCR: NEGATIVE

## 2020-12-30 LAB — TROPONIN I (HIGH SENSITIVITY)
Troponin I (High Sensitivity): 607 ng/L (ref <18)
Troponin I (High Sensitivity): 687 ng/L (ref <18)

## 2020-12-30 MED ORDER — HEPARIN (PORCINE) 25000 UT/250ML-% IV SOLN
1450.0000 [IU]/h | INTRAVENOUS | Status: DC
  Administered 2020-12-30: 1200 [IU]/h via INTRAVENOUS
  Administered 2020-12-31: 1450 [IU]/h via INTRAVENOUS
  Filled 2020-12-30 (×2): qty 250

## 2020-12-30 MED ORDER — ASPIRIN 81 MG PO CHEW
324.0000 mg | CHEWABLE_TABLET | Freq: Once | ORAL | Status: AC
  Administered 2020-12-30: 324 mg via ORAL
  Filled 2020-12-30: qty 4

## 2020-12-30 MED ORDER — IOHEXOL 350 MG/ML SOLN
100.0000 mL | Freq: Once | INTRAVENOUS | Status: AC | PRN
  Administered 2020-12-30: 80 mL via INTRAVENOUS

## 2020-12-30 MED ORDER — HEPARIN BOLUS VIA INFUSION
4000.0000 [IU] | Freq: Once | INTRAVENOUS | Status: AC
  Administered 2020-12-30: 4000 [IU] via INTRAVENOUS

## 2020-12-30 NOTE — H&P (Signed)
History and Physical  Jacob Baker LNL:[id number] DOB: 1970-09-20 DOA: 12/30/2020  Referring physician: Daleen Bo, MD PCP: [name], MD  Patient coming from: Home  Chief Complaint: Chest pain  HPI: Jacob Baker is a 50 y.o. male with medical history significant for T2DM, CHF, essential hypertension, COPD, GERD, hyperlipidemia who presents to the emergency department due to chest pain.  Patient complained of 2 to 3-week onset of chest pain which was midsternal with radiation to the back, this was intermittent and worsens with exertion.  Patient was sent yesterday, it was rated as 8/10 on pain scale, pain was nonreproducible, so he decided to go to the ED for further evaluation and management.  Patient states that he has been compliant with his medications.  He denies fever, chills, nausea, vomiting, headache.  Patient reports history of heart attack in his dad in his early 65s.  ED Course:  In the emergency department, BP was 163/103, but other vital signs are within normal range.  Work-up in the ED showed normal CBC except for leukocytosis and normal BMP except for hyperglycemia.  Troponin x2-607 > 687.  Influenza A, B, SARS coronavirus 2 was negative. Chest x-ray showed no active cardiopulmonary disease CT angiogram for chest abdomen and pelvis showed no evidence of aortic dissection or pulmonary embolism Aspirin 324 mg x 1 was given, Heparin drip was started.  Hospitalist was asked to admit patient for further evaluation and management.  Review of Systems: Constitutional: Negative for chills and fever.  HENT: Negative for ear pain and sore throat.   Eyes: Negative for pain and visual disturbance.  Respiratory: Negative for cough, chest tightness and shortness of breath.   Cardiovascular: Negative for chest pain and palpitations.  Gastrointestinal: Negative for abdominal pain and vomiting.  Endocrine: Negative for polyphagia and polyuria.  Genitourinary: Negative for  decreased urine volume, dysuria, enuresis Musculoskeletal: Negative for arthralgias and back pain.  Skin: Negative for color change and rash.  Allergic/Immunologic: Negative for immunocompromised state.  Neurological: Negative for tremors, syncope, speech difficulty, weakness, light-headedness and headaches.  Hematological: Does not bruise/bleed easily.  All other systems reviewed and are negative   Past Medical History:  Diagnosis Date   CHF (congestive heart failure) (HCC)    Diabetes mellitus without complication ([place])    DVT (deep venous thrombosis) ([place]) 04/2017   GERD (gastroesophageal reflux disease)    History of migraine    none in over 2 yrs   Hyperlipidemia    Hypertension    Syncope and collapse    x1 - approx 2-3 yrs ago - dehydration   Past Surgical History:  Procedure Laterality Date   COLONOSCOPY     COLONOSCOPY WITH PROPOFOL N/A 08/06/2017   Procedure: COLONOSCOPY WITH PROPOFOL;  Surgeon: Lucilla Lame, MD;  Location: [place];  Service: Endoscopy;  Laterality: N/A;  Diabetic - oral meds   ESOPHAGEAL DILATION  09/24/2016   Procedure: ESOPHAGEAL DILATION;  Surgeon: Lucilla Lame, MD;  Location: [place];  Service: Gastroenterology;;   ESOPHAGOGASTRODUODENOSCOPY (EGD) WITH PROPOFOL N/A 04/08/2015   Procedure: ESOPHAGOGASTRODUODENOSCOPY (EGD) WITH PROPOFOL with dialation;  Surgeon: Lucilla Lame, MD;  Location: [place];  Service: Endoscopy;  Laterality: N/A;  diabetic - oral meds   ESOPHAGOGASTRODUODENOSCOPY (EGD) WITH PROPOFOL N/A 09/24/2016   Procedure: ESOPHAGOGASTRODUODENOSCOPY (EGD) WITH PROPOFOL WITH DILATION;  Surgeon: Lucilla Lame, MD;  Location: [place];  Service: Gastroenterology;  Laterality: N/A;  Diabetic - oral meds   FINGER AMPUTATION  2009  partial removal left index finger    POLYPECTOMY  08/06/2017   Procedure: POLYPECTOMY INTESTINAL;  Surgeon: Lucilla Lame, MD;  Location: [place];  Service:  Endoscopy;;    Social History:  reports that he has been smoking cigarettes. He has a 54.00 pack-year smoking history. He has never used smokeless tobacco. He reports that he does not drink alcohol and does not use drugs.   No Known Allergies  Family History  Problem Relation Age of Onset   Heart disease Father        CAD   Hypertension Father    Diabetes Father    Hypertension Sister    Hypertension Brother    Hypertension Brother    Hypertension Mother      Prior to Admission medications   Medication Sig Start Date End Date Taking? Authorizing Provider  albuterol (VENTOLIN HFA) 108 (90 Base) MCG/ACT inhaler Inhale 2 puffs into the lungs every 6 (six) hours as needed for wheezing or shortness of breath. 04/28/19   [name], MD  amLODipine (NORVASC) 10 MG tablet TAKE 1 TABLET(10 MG) BY MOUTH DAILY 08/09/20   [name], MD  celecoxib (CELEBREX) 200 MG capsule TAKE 1 CAPSULE(200 MG) BY MOUTH TWICE DAILY 11/19/20   [name], MD  cyanocobalamin 1000 MCG tablet Take 100 mcg by mouth daily.    [provider]  dicyclomine (BENTYL) 20 MG tablet Take 1 tablet (20 mg total) by mouth 3 (three) times daily before meals. 07/03/20   Lucilla Lame, MD  glipiZIDE (GLUCOTROL XL) 5 MG 24 hr tablet Take 2 tablets (10 mg total) by mouth daily with breakfast. 08/03/20   [name], MD  glucose blood (ONETOUCH VERIO) test strip Test sugars twice daily 04/26/14   Phadke, Radhika P, MD  metFORMIN (GLUCOPHAGE) 850 MG tablet TAKE 1 TABLET(850 MG) BY MOUTH TWICE DAILY WITH A MEAL. 11/11/20   [name], MD  [facility] DELICA LANCETS 50N MISC Test sugars twice daily 04/26/14   Phadke, Radhika P, MD  pantoprazole (PROTONIX) 40 MG tablet TAKE 1 TABLET(40 MG) BY MOUTH DAILY 07/22/20   [name], MD  Plecanatide (TRULANCE) 3 MG TABS Take 3 mg by mouth daily. 05/20/20   Lucilla Lame, MD  potassium chloride (KLOR-CON) 10 MEQ tablet Take 1 tablet (10 mEq total) by mouth daily. 03/24/20    Rayna Sexton, PA-C  rosuvastatin (CRESTOR) 40 MG tablet Take 1 tablet (40 mg total) by mouth at bedtime. 10/22/19   [name], MD  sildenafil (REVATIO) 20 MG tablet Take 1 tablet (20 mg total) by mouth 3 (three) times daily. 07/31/20   [name], MD    Physical Exam: BP (!) 141/88 (BP Location: Left Arm)   Pulse 75   Temp 97.7 F (36.5 C) (Oral)   Resp 16   Ht 6\' 1"  (1.854 m)   Wt 88.8 kg   SpO2 99%   BMI 25.83 kg/m   General: 50 y.o. year-old male well developed well nourished in no acute distress.  Alert and oriented x3. HEENT: NCAT, EOMI Neck: Supple, trachea medial Cardiovascular: Regular rate and rhythm with no rubs or gallops.  No thyromegaly or JVD noted.  No lower extremity edema. 2/4 pulses in all 4 extremities. Respiratory: Clear to auscultation with no wheezes or rales. Good inspiratory effort. Abdomen: Soft, nontender nondistended with normal bowel sounds x4 quadrants. Muskuloskeletal: No cyanosis, clubbing or edema noted bilaterally Neuro: CN II-XII intact, strength 5/5 x 4, sensation,  reflexes intact Skin: No ulcerative lesions noted or rashes Psychiatry: Judgement and insight appear normal. Mood is appropriate for condition and setting          Labs on Admission:  Basic Metabolic Panel: Recent Labs  Lab 12/30/20 1804  NA 135  K 3.5  CL 104  CO2 24  GLUCOSE 171*  BUN 17  CREATININE 0.85  CALCIUM 8.8*   Liver Function Tests: No results for input(s): AST, ALT, ALKPHOS, BILITOT, PROT, ALBUMIN in the last 168 hours. No results for input(s): LIPASE, AMYLASE in the last 168 hours. No results for input(s): AMMONIA in the last 168 hours. CBC: Recent Labs  Lab 12/30/20 1804  WBC 13.2*  HGB 14.4  HCT 39.5  MCV 80.6  PLT 313   Cardiac Enzymes: No results for input(s): CKTOTAL, CKMB, CKMBINDEX, TROPONINI in the last 168 hours.  BNP (last 3 results) No results for input(s): BNP in the last 8760 hours.  ProBNP (last 3 results) No results  for input(s): PROBNP in the last 8760 hours.  CBG: No results for input(s): GLUCAP in the last 168 hours.  Radiological Exams on Admission: DG Chest 2 View  Result Date: 12/30/2020 CLINICAL DATA:  Chest pain. EXAM: CHEST - 2 VIEW COMPARISON:  Chest radiograph dated 06/11/2020. FINDINGS: The heart size and mediastinal contours are within normal limits. Both lungs are clear. The visualized skeletal structures are unremarkable. IMPRESSION: No active cardiopulmonary disease. Electronically Signed   By: Anner Crete M.D.   On: 12/30/2020 18:36   CT Angio Chest/Abd/Pel for Dissection W and/or W/WO  Result Date: 12/30/2020 CLINICAL DATA:  Chest and abdominal pain for several hours, initial encounter EXAM: CT ANGIOGRAPHY CHEST, ABDOMEN AND PELVIS TECHNIQUE: Non-contrast CT of the chest was initially obtained. Multidetector CT imaging through the chest, abdomen and pelvis was performed using the standard protocol during bolus administration of intravenous contrast. Multiplanar reconstructed images and MIPs were obtained and reviewed to evaluate the vascular anatomy. CONTRAST:  27mL OMNIPAQUE IOHEXOL 350 MG/ML SOLN COMPARISON:  CT from 10/24/2016, 05/29/2020 FINDINGS: CTA CHEST FINDINGS Cardiovascular: Precontrast images show no hyperdense crescent to suggest acute aortic injury. Post-contrast images show no aneurysmal dilatation or dissection. No cardiac enlargement is seen. No coronary calcifications are noted. Pulmonary artery as visualized is within normal limits although not timed for embolus evaluation. Mediastinum/Nodes: Thoracic inlet is within normal limits. No sizable hilar or mediastinal adenopathy is noted. The esophagus is within normal limits. Lungs/Pleura: Small ovoid nodular density is noted along the major fissure on the left best seen on image number 68 consistent with small subpleural lymph node. This is stable from the prior exam and consistent with a benign etiology. No focal infiltrate or  sizable effusion is seen. No parenchymal nodules are noted. Musculoskeletal: Degenerative changes of the thoracic spine are noted. No acute rib abnormality is seen. Review of the MIP images confirms the above findings. CTA ABDOMEN AND PELVIS FINDINGS VASCULAR Aorta: Abdominal aorta shows no aneurysmal dilatation or dissection. Minimal atherosclerotic calcifications are seen. Celiac: Variant anatomy is noted with the common hepatic artery arising directly from the aorta. Left gastric and splenic artery are within normal limits. SMA: Patent without evidence of aneurysm, dissection, vasculitis or significant stenosis. Renals: Both renal arteries are patent without evidence of aneurysm, dissection, vasculitis, fibromuscular dysplasia or significant stenosis. IMA: Patent without evidence of aneurysm, dissection, vasculitis or significant stenosis. Inflow: Iliacs show mild eccentric plaque in the proximal left common iliac artery although no stenosis is seen. No other  focal abnormality is noted. Veins: No specific venous abnormality is noted. Review of the MIP images confirms the above findings. NON-VASCULAR Hepatobiliary: Fatty infiltration of the liver is noted. Gallbladder is within normal limits. Pancreas: Unremarkable. No pancreatic ductal dilatation or surrounding inflammatory changes. Spleen: Normal in size without focal abnormality. Adrenals/Urinary Tract: Adrenal glands are within normal limits. Kidneys demonstrate a normal enhancement pattern bilaterally. No renal calculi or obstructive changes are seen. The ureters are within normal limits. Bladder is partially distended. Stomach/Bowel: Appendix is within normal limits. No obstructive or inflammatory changes of the colon seen. Small bowel and stomach are within normal limits. Lymphatic: No significant lymphadenopathy is noted. Reproductive: Prostate is unremarkable. Other: No abdominal wall hernia or abnormality. No abdominopelvic ascites. Musculoskeletal: No  acute bony abnormality is noted. Degenerative changes of lumbar spine are seen. Review of the MIP images confirms the above findings. IMPRESSION: CTA of the chest: No evidence of aortic dissection or pulmonary embolism. Stable subpleural lymph nodes unchanged from 2018. No acute abnormality is noted. CTA of the abdomen and pelvis: Mild atherosclerotic changes are seen without acute dissection or aneurysmal dilatation. Fatty infiltration of the liver. No other focal abnormality is noted. Electronically Signed   By: Inez Catalina M.D.   On: 12/30/2020 20:19    EKG: I independently viewed the EKG done and my findings are as followed: Normal sinus rhythm at a rate of 82 bpm.  Nonspecific ST and T wave abnormality.  Assessment/Plan Present on Admission:  Chest pain  Current smoker  Essential hypertension  Mixed hyperlipidemia  Principal Problem:   Chest pain Active Problems:   Current smoker   Essential hypertension   Mixed hyperlipidemia   Leukocytosis   Hyperglycemia due to diabetes mellitus (HCC)   Elevated troponin   GERD (gastroesophageal reflux disease)  Chest pain possibly due to NSTEMI Elevated troponin Cardiovascular risk factors include hypertension, hyperlipidemia, diabetes mellitus, tobacco use Continue telemetry  Troponins x2 - 607 > 687, continue to trend troponin EKG personally reviewed showed normal sinus rhythm at a rate of 82 bpm with non specific ST and T wave abnormalities Cardiology will be consulted to help decide if Stress test is needed in am Versus other  diagnostic modalities.    Respiratory 24 mg x 1 was given, continue aspirin 81 mg daily  Hyperglycemia secondary to T2DM Continue ISS and hypoglycemia protocol Glipizide and metformin will be held at this time  Leukocytosis possibly reactive WBC 13.2, continue to monitor WBC with morning labs  GERD Continue Protonix  Essential hypertension Continue amlodipine  Mixed hyperlipidemia Continue  Crestor  Tobacco use Patient was counseled on tobacco abuse cessation  DVT prophylaxis: Heparin drip  Code Status: Full code  Family Communication: Wife at bedside (all questions answered to satisfaction)  Disposition Plan:  Patient is from:                        home Anticipated DC to:                   SNF or family members home Anticipated DC date:               2-3 days Anticipated DC barriers:          Patient requires inpatient management due to chest pain pending cardiology consult  Consults called: Cardiology  Admission status: Observation   Bernadette Hoit MD [facility]  12/31/2020, 5:39 AM

## 2020-12-30 NOTE — Progress Notes (Signed)
ANTICOAGULATION CONSULT NOTE - Initial Consult  Pharmacy Consult for Heparin Indication: chest pain/ACS  No Known Allergies  Patient Measurements: Height: 6\' 1"  (185.4 cm) Weight: 88.5 kg (195 lb) IBW/kg (Calculated) : 79.9 Heparin Dosing Weight: 88.5 kg  Vital Signs: Temp: 98.2 F (36.8 C) (12/05 2201) BP: 144/96 (12/05 2130) Pulse Rate: 71 (12/05 2130)  Labs: Recent Labs    12/30/20 1804 12/30/20 2018  HGB 14.4  --   HCT 39.5  --   PLT 313  --   CREATININE 0.85  --   TROPONINIHS 607* 687*    Estimated Creatinine Clearance: 114.9 mL/min (by C-G formula based on SCr of 0.85 mg/dL).   Medical History: Past Medical History:  Diagnosis Date   CHF (congestive heart failure) ([place])    Diabetes mellitus without complication (HCC)    DVT (deep venous thrombosis) ([place]) 04/2017   GERD (gastroesophageal reflux disease)    History of migraine    none in over 2 yrs   Hyperlipidemia    Hypertension    Syncope and collapse    x1 - approx 2-3 yrs ago - dehydration   Assessment:  50 yr old male to begin IV heparin for chest pain/ACS.  Not on anticoagulants prior to admission.  Goal of Therapy:  Heparin level 0.3-0.7 units/ml Monitor platelets by anticoagulation protocol: Yes   Plan:  Heparin 4000 units IV bolus. Heparin drip to begin at 1200 units/hr Heparin level ~6 hrs after drip begins. Daily heparin level and CBC while on heparin.  Arty Baumgartner, RPh 12/30/2020,10:05 PM

## 2020-12-30 NOTE — ED Notes (Signed)
Critical value trop 607  Reported to edp at this time.

## 2020-12-30 NOTE — ED Triage Notes (Signed)
Patient complaining of chest pain that started today around 2pm while he was driving for work.

## 2020-12-30 NOTE — ED Provider Notes (Signed)
[facility] Provider Note   CSN: [id number] Arrival date & time: 12/30/20  1739     History Chief Complaint  Patient presents with   Chest Pain    Jacob Baker is a 50 y.o. male.  HPI He is here for evaluation of chest pain, left anterior which has been ongoing for 2 weeks.  He had a serious bout of pain in the left chest, about a week ago.  Since then he has noticed dyspnea on exertion.  He has a history of congestive heart failure.  He does not have available stress test or cardiac restratification test, visible within the electronic medical record.  He has history of diabetes and high blood pressure.  He has been taking his usual prescribed medications.  He denies headache, focal weakness or paresthesia.  No known trauma.  He has been eating and drinking well.    Past Medical History:  Diagnosis Date   CHF (congestive heart failure) ([place])    Diabetes mellitus without complication ([place])    DVT (deep venous thrombosis) ([place]) 04/2017   GERD (gastroesophageal reflux disease)    History of migraine    none in over 2 yrs   Hyperlipidemia    Hypertension    Syncope and collapse    x1 - approx 2-3 yrs ago - dehydration    Patient Active Problem List   Diagnosis Date Noted   Chronic right shoulder pain 10/21/2020   Uncontrolled type 2 diabetes mellitus with hyperglycemia ([place]) 10/26/2018   Mixed hyperlipidemia 10/26/2018   Chronic idiopathic constipation    Change in bowel habits    Polyp of sigmoid colon    Rectal polyp    Chronic pain of right hip 10/15/2015   Neuropathy 07/24/2015   Essential hypertension 07/24/2015   Dyslipidemia with low high density lipoprotein (HDL) cholesterol with hypertriglyceridemia due to type 2 diabetes mellitus ([place]) 07/24/2015   Encounter for preventive health examination 10/26/2014   Pituitary insufficiency ([place]) 09/12/2014   Low testosterone 09/11/2014   Onychomycosis 04/18/2014   Hyperlipidemia associated with type  2 diabetes mellitus ([place]) 11/26/2013   Current smoker 11/24/2013   Hiatal hernia 11/15/2013   Tubular adenoma of colon 60/10/9321   History of Helicobacter pylori infection 10/18/2013   Gastritis due to nonsteroidal anti-inflammatory drug (NSAID) 10/14/2013   HTN (hypertension) 04/26/2013   Anxiety 04/26/2013   Chest pain 10/12/2011    Past Surgical History:  Procedure Laterality Date   COLONOSCOPY     COLONOSCOPY WITH PROPOFOL N/A 08/06/2017   Procedure: COLONOSCOPY WITH PROPOFOL;  Surgeon: Lucilla Lame, MD;  Location: [facility];  Service: Endoscopy;  Laterality: N/A;  Diabetic - oral meds   ESOPHAGEAL DILATION  09/24/2016   Procedure: ESOPHAGEAL DILATION;  Surgeon: Lucilla Lame, MD;  Location: [facility];  Service: Gastroenterology;;   ESOPHAGOGASTRODUODENOSCOPY (EGD) WITH PROPOFOL N/A 04/08/2015   Procedure: ESOPHAGOGASTRODUODENOSCOPY (EGD) WITH PROPOFOL with dialation;  Surgeon: Lucilla Lame, MD;  Location: [facility];  Service: Endoscopy;  Laterality: N/A;  diabetic - oral meds   ESOPHAGOGASTRODUODENOSCOPY (EGD) WITH PROPOFOL N/A 09/24/2016   Procedure: ESOPHAGOGASTRODUODENOSCOPY (EGD) WITH PROPOFOL WITH DILATION;  Surgeon: Lucilla Lame, MD;  Location: [facility];  Service: Gastroenterology;  Laterality: N/A;  Diabetic - oral meds   FINGER AMPUTATION  2009   partial removal left index finger    POLYPECTOMY  08/06/2017   Procedure: POLYPECTOMY INTESTINAL;  Surgeon: Lucilla Lame, MD;  Location: [facility];  Service: Endoscopy;;  Family History  Problem Relation Age of Onset   Heart disease Father        CAD   Hypertension Father    Diabetes Father    Hypertension Sister    Hypertension Brother    Hypertension Brother    Hypertension Mother     Social History   Tobacco Use   Smoking status: Every Day    Packs/day: 2.00    Years: 27.00    Pack years: 54.00    Types: Cigarettes   Smokeless tobacco: Never  Vaping Use    Vaping Use: Never used  Substance Use Topics   Alcohol use: No   Drug use: No    Home Medications Prior to Admission medications   Medication Sig Start Date End Date Taking? Authorizing Provider  albuterol (VENTOLIN HFA) 108 (90 Base) MCG/ACT inhaler Inhale 2 puffs into the lungs every 6 (six) hours as needed for wheezing or shortness of breath. 04/28/19   [name], MD  amLODipine (NORVASC) 10 MG tablet TAKE 1 TABLET(10 MG) BY MOUTH DAILY 08/09/20   [name], MD  celecoxib (CELEBREX) 200 MG capsule TAKE 1 CAPSULE(200 MG) BY MOUTH TWICE DAILY 11/19/20   [name], MD  cyanocobalamin 1000 MCG tablet Take 100 mcg by mouth daily.    [provider]  dicyclomine (BENTYL) 20 MG tablet Take 1 tablet (20 mg total) by mouth 3 (three) times daily before meals. 07/03/20   Lucilla Lame, MD  glipiZIDE (GLUCOTROL XL) 5 MG 24 hr tablet Take 2 tablets (10 mg total) by mouth daily with breakfast. 08/03/20   [name], MD  glucose blood (ONETOUCH VERIO) test strip Test sugars twice daily 04/26/14   Phadke, Radhika P, MD  metFORMIN (GLUCOPHAGE) 850 MG tablet TAKE 1 TABLET(850 MG) BY MOUTH TWICE DAILY WITH A MEAL. 11/11/20   [name], MD  [facility] DELICA LANCETS 25D MISC Test sugars twice daily 04/26/14   Phadke, Radhika P, MD  pantoprazole (PROTONIX) 40 MG tablet TAKE 1 TABLET(40 MG) BY MOUTH DAILY 07/22/20   [name], MD  Plecanatide (TRULANCE) 3 MG TABS Take 3 mg by mouth daily. 05/20/20   Lucilla Lame, MD  potassium chloride (KLOR-CON) 10 MEQ tablet Take 1 tablet (10 mEq total) by mouth daily. 03/24/20   Rayna Sexton, PA-C  rosuvastatin (CRESTOR) 40 MG tablet Take 1 tablet (40 mg total) by mouth at bedtime. 10/22/19   [name], MD  sildenafil (REVATIO) 20 MG tablet Take 1 tablet (20 mg total) by mouth 3 (three) times daily. 07/31/20   [name], MD    Allergies    Patient has no known allergies.  Review of Systems   Review of Systems  All other systems  reviewed and are negative.  Physical Exam Updated Vital Signs BP (!) 144/96   Pulse 71   Temp 98.2 F (36.8 C)   Resp 18   Ht 6\' 1"  (1.854 m)   Wt 88.5 kg   SpO2 99%   BMI 25.73 kg/m   Physical Exam Vitals and nursing note reviewed.  Constitutional:      General: He is not in acute distress.    Appearance: He is well-developed. He is not ill-appearing or diaphoretic.  HENT:     Head: Normocephalic and atraumatic.     Right Ear: External ear normal.     Left Ear: External ear normal.  Eyes:     Conjunctiva/sclera: Conjunctivae normal.  Pupils: Pupils are equal, round, and reactive to light.  Neck:     Trachea: Phonation normal.  Cardiovascular:     Rate and Rhythm: Normal rate and regular rhythm.     Heart sounds: Normal heart sounds.  Pulmonary:     Effort: Pulmonary effort is normal. No respiratory distress.     Breath sounds: Normal breath sounds. No stridor.  Chest:     Chest wall: No tenderness.  Abdominal:     General: There is no distension.     Palpations: Abdomen is soft.     Tenderness: There is no abdominal tenderness. There is no guarding.  Musculoskeletal:        General: Normal range of motion.     Cervical back: Normal range of motion and neck supple.  Skin:    General: Skin is warm and dry.  Neurological:     Mental Status: He is alert and oriented to person, place, and time.     Cranial Nerves: No cranial nerve deficit.     Sensory: No sensory deficit.     Motor: No abnormal muscle tone.     Coordination: Coordination normal.  Psychiatric:        Mood and Affect: Mood normal.        Behavior: Behavior normal.        Thought Content: Thought content normal.        Judgment: Judgment normal.    ED Results / Procedures / Treatments   Labs (all labs ordered are listed, but only abnormal results are displayed) Labs Reviewed  BASIC METABOLIC PANEL - Abnormal; Notable for the following components:      Result Value   Glucose, Bld 171 (*)     Calcium 8.8 (*)    All other components within normal limits  CBC - Abnormal; Notable for the following components:   WBC 13.2 (*)    MCHC 36.5 (*)    All other components within normal limits  TROPONIN I (HIGH SENSITIVITY) - Abnormal; Notable for the following components:   Troponin I (High Sensitivity) 607 (*)    All other components within normal limits  TROPONIN I (HIGH SENSITIVITY) - Abnormal; Notable for the following components:   Troponin I (High Sensitivity) 687 (*)    All other components within normal limits  RESP PANEL BY RT-PCR (FLU A&B, COVID) ARPGX2  HEPARIN LEVEL (UNFRACTIONATED)  CBC    EKG EKG Interpretation  Date/Time:  Monday December 30 2020 17:51:53 EST Ventricular Rate:  82 PR Interval:  198 QRS Duration: 78 QT Interval:  362 QTC Calculation: 422 R Axis:   19 Text Interpretation: Normal sinus rhythm Nonspecific ST and T wave abnormality Abnormal ECG since last tracing no significant change Confirmed by Daleen Bo [phone]) on 12/30/2020 7:14:04 PM  Radiology DG Chest 2 View  Result Date: 12/30/2020 CLINICAL DATA:  Chest pain. EXAM: CHEST - 2 VIEW COMPARISON:  Chest radiograph dated 06/11/2020. FINDINGS: The heart size and mediastinal contours are within normal limits. Both lungs are clear. The visualized skeletal structures are unremarkable. IMPRESSION: No active cardiopulmonary disease. Electronically Signed   By: Anner Crete M.D.   On: 12/30/2020 18:36   CT Angio Chest/Abd/Pel for Dissection W and/or W/WO  Result Date: 12/30/2020 CLINICAL DATA:  Chest and abdominal pain for several hours, initial encounter EXAM: CT ANGIOGRAPHY CHEST, ABDOMEN AND PELVIS TECHNIQUE: Non-contrast CT of the chest was initially obtained. Multidetector CT imaging through the chest, abdomen and pelvis was performed using the standard  protocol during bolus administration of intravenous contrast. Multiplanar reconstructed images and MIPs were obtained and reviewed to  evaluate the vascular anatomy. CONTRAST:  23mL OMNIPAQUE IOHEXOL 350 MG/ML SOLN COMPARISON:  CT from 10/24/2016, 05/29/2020 FINDINGS: CTA CHEST FINDINGS Cardiovascular: Precontrast images show no hyperdense crescent to suggest acute aortic injury. Post-contrast images show no aneurysmal dilatation or dissection. No cardiac enlargement is seen. No coronary calcifications are noted. Pulmonary artery as visualized is within normal limits although not timed for embolus evaluation. Mediastinum/Nodes: Thoracic inlet is within normal limits. No sizable hilar or mediastinal adenopathy is noted. The esophagus is within normal limits. Lungs/Pleura: Small ovoid nodular density is noted along the major fissure on the left best seen on image number 68 consistent with small subpleural lymph node. This is stable from the prior exam and consistent with a benign etiology. No focal infiltrate or sizable effusion is seen. No parenchymal nodules are noted. Musculoskeletal: Degenerative changes of the thoracic spine are noted. No acute rib abnormality is seen. Review of the MIP images confirms the above findings. CTA ABDOMEN AND PELVIS FINDINGS VASCULAR Aorta: Abdominal aorta shows no aneurysmal dilatation or dissection. Minimal atherosclerotic calcifications are seen. Celiac: Variant anatomy is noted with the common hepatic artery arising directly from the aorta. Left gastric and splenic artery are within normal limits. SMA: Patent without evidence of aneurysm, dissection, vasculitis or significant stenosis. Renals: Both renal arteries are patent without evidence of aneurysm, dissection, vasculitis, fibromuscular dysplasia or significant stenosis. IMA: Patent without evidence of aneurysm, dissection, vasculitis or significant stenosis. Inflow: Iliacs show mild eccentric plaque in the proximal left common iliac artery although no stenosis is seen. No other focal abnormality is noted. Veins: No specific venous abnormality is noted.  Review of the MIP images confirms the above findings. NON-VASCULAR Hepatobiliary: Fatty infiltration of the liver is noted. Gallbladder is within normal limits. Pancreas: Unremarkable. No pancreatic ductal dilatation or surrounding inflammatory changes. Spleen: Normal in size without focal abnormality. Adrenals/Urinary Tract: Adrenal glands are within normal limits. Kidneys demonstrate a normal enhancement pattern bilaterally. No renal calculi or obstructive changes are seen. The ureters are within normal limits. Bladder is partially distended. Stomach/Bowel: Appendix is within normal limits. No obstructive or inflammatory changes of the colon seen. Small bowel and stomach are within normal limits. Lymphatic: No significant lymphadenopathy is noted. Reproductive: Prostate is unremarkable. Other: No abdominal wall hernia or abnormality. No abdominopelvic ascites. Musculoskeletal: No acute bony abnormality is noted. Degenerative changes of lumbar spine are seen. Review of the MIP images confirms the above findings. IMPRESSION: CTA of the chest: No evidence of aortic dissection or pulmonary embolism. Stable subpleural lymph nodes unchanged from 2018. No acute abnormality is noted. CTA of the abdomen and pelvis: Mild atherosclerotic changes are seen without acute dissection or aneurysmal dilatation. Fatty infiltration of the liver. No other focal abnormality is noted. Electronically Signed   By: Inez Catalina M.D.   On: 12/30/2020 20:19    Procedures .Critical Care Performed by: Daleen Bo, MD Authorized by: Daleen Bo, MD   Critical care provider statement:    Critical care time (minutes):  35   Critical care start time:  12/30/2020 7:20 PM   Critical care end time:  12/30/2020 9:55 PM   Critical care time was exclusive of:  Separately billable procedures and treating other patients   Critical care was necessary to treat or prevent imminent or life-threatening deterioration of the following conditions:   Cardiac failure   Critical care was time spent personally by  me on the following activities:  Blood draw for specimens, development of treatment plan with patient or surrogate, discussions with consultants, evaluation of patient's response to treatment, examination of patient, ordering and performing treatments and interventions, ordering and review of laboratory studies, ordering and review of radiographic studies, pulse oximetry, re-evaluation of patient's condition and review of old charts   Medications Ordered in ED Medications  heparin ADULT infusion 100 units/mL (25000 units/270mL) (1,200 Units/hr Intravenous New Bag/Given 12/30/20 2212)  aspirin chewable tablet 324 mg (324 mg Oral Given 12/30/20 1919)  iohexol (OMNIPAQUE) 350 MG/ML injection 100 mL (80 mLs Intravenous Contrast Given 12/30/20 2012)  heparin bolus via infusion 4,000 Units (4,000 Units Intravenous Bolus from Bag 12/30/20 2212)    ED Course  I have reviewed the triage vital signs and the nursing notes.  Pertinent labs & imaging results that were available during my care of the patient were reviewed by me and considered in my medical decision making (see chart for details).    MDM Rules/Calculators/A&P                            Patient Vitals for the past 24 hrs:  BP Temp Pulse Resp SpO2 Height Weight  12/30/20 2201 -- 98.2 F (36.8 C) -- -- -- -- --  12/30/20 2130 (!) 144/96 -- 71 18 99 % -- --  12/30/20 2100 (!) 147/93 -- 75 17 98 % -- --  12/30/20 2000 (!) 140/95 -- 72 (!) 21 99 % -- --  12/30/20 1930 -- -- 74 17 100 % -- --  12/30/20 1920 (!) 149/106 -- 78 17 100 % -- --  12/30/20 1747 -- -- -- -- -- 6\' 1"  (1.854 m) 88.5 kg    9:43 PM Reevaluation with update and discussion. After initial assessment and treatment, an updated evaluation reveals since seen earlier, no change in status.  Updated patient and family member on findings and plan.  They are agreeable. Daleen Bo   Medical Decision Making:  This  patient is presenting for evaluation of ongoing chest pain with shortness of breath, which does require a range of treatment options, and is a complaint that involves a high risk of morbidity and mortality. The differential diagnoses include ACS, PE, aortic dissection. I decided to review old records, and in summary millage male with chronic comorbidities presenting with concerning coronary syndrome versus large vessel dissection.  I obtained additional historical information from family member at bedside.  Clinical Laboratory Tests Ordered, included CBC, Metabolic panel, and delta troponin, viral panel . Review indicates normal except troponin elevated, slightly increased on delta testing, white count high, glucose high, calcium low. Radiologic Tests Ordered, included chest x-ray, CT angiogram chest abdomen pelvis.  I independently Visualized: Radiograph images, which show no acute abnormality  Cardiac Monitor Tracing which shows normal sinus    Critical Interventions-clinical evaluation, laboratory testing, EKG, cardiac monitor, aspirin, heparin, observation, discussion with cardiology, arrangements for hospitalization.  After These Interventions, the Patient was reevaluated and was found with signs and symptoms of NSTEMI.  No other associated vascular or intrathoracic abnormalities.  Significant history of tobacco abuse, hypertension, diabetes and dyslipidemia.  Screening viral panel infections negative.  Etiology of troponin elevation is not clear and could represent a missed MI.  No evidence for heart failure by radiographic imaging.  Telephone consultation with cardiology for discussion of planning for treatment.  He is agreeable to see the patient as a Optometrist, Dr.  Foye Clock.  He recommends   CRITICAL CARE-yes Performed by: Daleen Bo  Nursing Notes Reviewed/ Care Coordinated Applicable Imaging Reviewed Interpretation of Laboratory Data incorporated into ED treatment   10:52  PM-Consult complete with hospitalist. Patient case explained and discussed.  He agrees to admit patient for further evaluation and treatment. Call ended at 10:28 PM  Final Clinical Impression(s) / ED Diagnoses Final diagnoses:  NSTEMI (non-ST elevated myocardial infarction) [facility])    Rx / [place] Orders ED Discharge Orders     None        Daleen Bo, MD 12/30/20 2245

## 2020-12-31 ENCOUNTER — Encounter (HOSPITAL_COMMUNITY)
Admission: EM | Disposition: A | Discharge status: Home / Self Care | Payer: Self-pay | Source: Home / Self Care | Attending: Cardiology

## 2020-12-31 ENCOUNTER — Ambulatory Visit (HOSPITAL_COMMUNITY): Payer: BC Managed Care – PPO | Admitting: Occupational Therapy

## 2020-12-31 ENCOUNTER — Observation Stay (HOSPITAL_BASED_OUTPATIENT_CLINIC_OR_DEPARTMENT_OTHER): Payer: BC Managed Care – PPO

## 2020-12-31 DIAGNOSIS — K219 Gastro-esophageal reflux disease without esophagitis: Secondary | ICD-10-CM

## 2020-12-31 DIAGNOSIS — Z720 Tobacco use: Secondary | ICD-10-CM

## 2020-12-31 DIAGNOSIS — I509 Heart failure, unspecified: Secondary | ICD-10-CM | POA: Insufficient documentation

## 2020-12-31 DIAGNOSIS — I214 Non-ST elevation (NSTEMI) myocardial infarction: Principal | ICD-10-CM

## 2020-12-31 DIAGNOSIS — E1165 Type 2 diabetes mellitus with hyperglycemia: Secondary | ICD-10-CM

## 2020-12-31 DIAGNOSIS — E785 Hyperlipidemia, unspecified: Secondary | ICD-10-CM | POA: Diagnosis not present

## 2020-12-31 DIAGNOSIS — Z89022 Acquired absence of left finger(s): Secondary | ICD-10-CM | POA: Diagnosis not present

## 2020-12-31 DIAGNOSIS — Z86010 Personal history of colonic polyps: Secondary | ICD-10-CM | POA: Diagnosis not present

## 2020-12-31 DIAGNOSIS — E1169 Type 2 diabetes mellitus with other specified complication: Secondary | ICD-10-CM

## 2020-12-31 DIAGNOSIS — R778 Other specified abnormalities of plasma proteins: Secondary | ICD-10-CM

## 2020-12-31 DIAGNOSIS — I25110 Atherosclerotic heart disease of native coronary artery with unstable angina pectoris: Secondary | ICD-10-CM | POA: Diagnosis not present

## 2020-12-31 DIAGNOSIS — I10 Essential (primary) hypertension: Secondary | ICD-10-CM | POA: Diagnosis present

## 2020-12-31 DIAGNOSIS — Z7984 Long term (current) use of oral hypoglycemic drugs: Secondary | ICD-10-CM | POA: Diagnosis not present

## 2020-12-31 DIAGNOSIS — I252 Old myocardial infarction: Secondary | ICD-10-CM | POA: Diagnosis present

## 2020-12-31 DIAGNOSIS — Z8249 Family history of ischemic heart disease and other diseases of the circulatory system: Secondary | ICD-10-CM | POA: Diagnosis not present

## 2020-12-31 DIAGNOSIS — Z20822 Contact with and (suspected) exposure to covid-19: Secondary | ICD-10-CM | POA: Diagnosis present

## 2020-12-31 DIAGNOSIS — R079 Chest pain, unspecified: Secondary | ICD-10-CM | POA: Diagnosis not present

## 2020-12-31 DIAGNOSIS — D72829 Elevated white blood cell count, unspecified: Secondary | ICD-10-CM

## 2020-12-31 DIAGNOSIS — Z79899 Other long term (current) drug therapy: Secondary | ICD-10-CM | POA: Diagnosis not present

## 2020-12-31 DIAGNOSIS — J449 Chronic obstructive pulmonary disease, unspecified: Secondary | ICD-10-CM | POA: Diagnosis present

## 2020-12-31 DIAGNOSIS — F17210 Nicotine dependence, cigarettes, uncomplicated: Secondary | ICD-10-CM | POA: Diagnosis present

## 2020-12-31 DIAGNOSIS — Z86718 Personal history of other venous thrombosis and embolism: Secondary | ICD-10-CM | POA: Diagnosis not present

## 2020-12-31 DIAGNOSIS — E782 Mixed hyperlipidemia: Secondary | ICD-10-CM | POA: Diagnosis present

## 2020-12-31 DIAGNOSIS — Z955 Presence of coronary angioplasty implant and graft: Secondary | ICD-10-CM | POA: Diagnosis not present

## 2020-12-31 DIAGNOSIS — F17200 Nicotine dependence, unspecified, uncomplicated: Secondary | ICD-10-CM | POA: Diagnosis not present

## 2020-12-31 DIAGNOSIS — I2510 Atherosclerotic heart disease of native coronary artery without angina pectoris: Secondary | ICD-10-CM

## 2020-12-31 DIAGNOSIS — E230 Hypopituitarism: Secondary | ICD-10-CM | POA: Diagnosis present

## 2020-12-31 DIAGNOSIS — I25119 Atherosclerotic heart disease of native coronary artery with unspecified angina pectoris: Secondary | ICD-10-CM | POA: Diagnosis present

## 2020-12-31 DIAGNOSIS — Z833 Family history of diabetes mellitus: Secondary | ICD-10-CM | POA: Diagnosis not present

## 2020-12-31 HISTORY — PX: CORONARY STENT INTERVENTION: CATH118234

## 2020-12-31 HISTORY — PX: LEFT HEART CATH AND CORONARY ANGIOGRAPHY: CATH118249

## 2020-12-31 LAB — ECHOCARDIOGRAM COMPLETE
AR max vel: 3.26 cm2
AV Area VTI: 3.29 cm2
AV Area mean vel: 3.02 cm2
AV Mean grad: 3 mmHg
AV Peak grad: 5.2 mmHg
Ao pk vel: 1.14 m/s
Area-P 1/2: 3 cm2
Calc EF: 54.4 %
Height: 73 in
MV VTI: 3.23 cm2
S' Lateral: 2.6 cm
Single Plane A2C EF: 54.9 %
Single Plane A4C EF: 56.7 %
Weight: 3132.3 oz

## 2020-12-31 LAB — GLUCOSE, CAPILLARY
Glucose-Capillary: 231 mg/dL — ABNORMAL HIGH (ref 70–99)
Glucose-Capillary: 211 mg/dL — ABNORMAL HIGH (ref 70–99)
Glucose-Capillary: 177 mg/dL — ABNORMAL HIGH (ref 70–99)
Glucose-Capillary: 284 mg/dL — ABNORMAL HIGH (ref 70–99)

## 2020-12-31 LAB — CBC
HCT: 37.7 % — ABNORMAL LOW (ref 39.0–52.0)
Hemoglobin: 13.2 g/dL (ref 13.0–17.0)
MCH: 28.4 pg (ref 26.0–34.0)
MCHC: 35 g/dL (ref 30.0–36.0)
MCV: 81.3 fL (ref 80.0–100.0)
Platelets: 267 10*3/uL (ref 150–400)
RBC: 4.64 MIL/uL (ref 4.22–5.81)
RDW: 12.9 % (ref 11.5–15.5)
WBC: 9.7 10*3/uL (ref 4.0–10.5)
nRBC: 0 % (ref 0.0–0.2)

## 2020-12-31 LAB — POCT ACTIVATED CLOTTING TIME
Activated Clotting Time: 269 seconds
Activated Clotting Time: 281 seconds

## 2020-12-31 LAB — HEMOGLOBIN A1C
Hgb A1c MFr Bld: 7.4 % — ABNORMAL HIGH (ref 4.8–5.6)
Mean Plasma Glucose: 165.68 mg/dL

## 2020-12-31 LAB — COMPREHENSIVE METABOLIC PANEL
ALT: 12 U/L (ref 0–44)
AST: 10 U/L — ABNORMAL LOW (ref 15–41)
Albumin: 3.8 g/dL (ref 3.5–5.0)
Alkaline Phosphatase: 88 U/L (ref 38–126)
Anion gap: 8 (ref 5–15)
BUN: 17 mg/dL (ref 6–20)
CO2: 24 mmol/L (ref 22–32)
Calcium: 8.7 mg/dL — ABNORMAL LOW (ref 8.9–10.3)
Chloride: 105 mmol/L (ref 98–111)
Creatinine, Ser: 0.86 mg/dL (ref 0.61–1.24)
GFR, Estimated: >60 mL/min (ref >60)
Glucose, Bld: 254 mg/dL — ABNORMAL HIGH (ref 70–99)
Potassium: 3.5 mmol/L (ref 3.5–5.1)
Sodium: 137 mmol/L (ref 135–145)
Total Bilirubin: 0.5 mg/dL (ref 0.3–1.2)
Total Protein: 6 g/dL — ABNORMAL LOW (ref 6.5–8.1)

## 2020-12-31 LAB — TROPONIN I (HIGH SENSITIVITY)
Troponin I (High Sensitivity): 415 ng/L (ref <18)
Troponin I (High Sensitivity): 408 ng/L (ref <18)

## 2020-12-31 LAB — HIV ANTIBODY (ROUTINE TESTING W REFLEX): HIV Screen 4th Generation wRfx: NONREACTIVE

## 2020-12-31 LAB — HEPARIN LEVEL (UNFRACTIONATED)
Heparin Unfractionated: 0.12 IU/mL — ABNORMAL LOW (ref 0.30–0.70)
Heparin Unfractionated: 0.4 IU/mL (ref 0.30–0.70)

## 2020-12-31 LAB — MAGNESIUM: Magnesium: 2.1 mg/dL (ref 1.7–2.4)

## 2020-12-31 LAB — PHOSPHORUS: Phosphorus: 3.2 mg/dL (ref 2.5–4.6)

## 2020-12-31 SURGERY — LEFT HEART CATH AND CORONARY ANGIOGRAPHY
Anesthesia: LOCAL

## 2020-12-31 MED ORDER — SODIUM CHLORIDE 0.9% FLUSH
3.0000 mL | Freq: Two times a day (BID) | INTRAVENOUS | Status: DC
  Administered 2021-01-01: 3 mL via INTRAVENOUS

## 2020-12-31 MED ORDER — NITROGLYCERIN 1 MG/10 ML FOR IR/CATH LAB
INTRA_ARTERIAL | Status: AC
  Filled 2020-12-31: qty 10

## 2020-12-31 MED ORDER — SODIUM CHLORIDE 0.9% FLUSH: 3.0000 mL | INTRAVENOUS | Status: DC | PRN

## 2020-12-31 MED ORDER — CLOPIDOGREL BISULFATE 75 MG PO TABS
75.0000 mg | ORAL_TABLET | Freq: Every day | ORAL | Status: DC
  Administered 2021-01-01: 75 mg via ORAL
  Filled 2020-12-31: qty 1

## 2020-12-31 MED ORDER — SODIUM CHLORIDE 0.9 % IV SOLN
250.0000 mL | INTRAVENOUS | Status: DC | PRN
  Administered 2020-12-31: 250 mL via INTRAVENOUS

## 2020-12-31 MED ORDER — INSULIN ASPART 100 UNIT/ML IJ SOLN
0.0000 [IU] | Freq: Every day | INTRAMUSCULAR | Status: DC
  Administered 2020-12-31: 3 [IU] via SUBCUTANEOUS

## 2020-12-31 MED ORDER — HYDRALAZINE HCL 20 MG/ML IJ SOLN: 10.0000 mg | INTRAMUSCULAR | Status: AC | PRN

## 2020-12-31 MED ORDER — LIDOCAINE HCL (PF) 1 % IJ SOLN
INTRAMUSCULAR | Status: DC | PRN
  Administered 2020-12-31: 2 mL

## 2020-12-31 MED ORDER — ATORVASTATIN CALCIUM 80 MG PO TABS: 80.0000 mg | ORAL_TABLET | Freq: Every day | ORAL | Status: DC

## 2020-12-31 MED ORDER — HEPARIN SODIUM (PORCINE) 1000 UNIT/ML IJ SOLN
INTRAMUSCULAR | Status: AC
  Filled 2020-12-31: qty 10

## 2020-12-31 MED ORDER — CLOPIDOGREL BISULFATE 300 MG PO TABS
ORAL_TABLET | ORAL | Status: AC
  Filled 2020-12-31: qty 2

## 2020-12-31 MED ORDER — IOHEXOL 350 MG/ML SOLN
INTRAVENOUS | Status: DC | PRN
  Administered 2020-12-31: 205 mL

## 2020-12-31 MED ORDER — HEPARIN SODIUM (PORCINE) 1000 UNIT/ML IJ SOLN
INTRAMUSCULAR | Status: DC | PRN
  Administered 2020-12-31: 2000 [IU] via INTRAVENOUS
  Administered 2020-12-31: 4500 [IU] via INTRAVENOUS
  Administered 2020-12-31: 2000 [IU] via INTRAVENOUS
  Administered 2020-12-31: 5500 [IU] via INTRAVENOUS

## 2020-12-31 MED ORDER — METOPROLOL TARTRATE 12.5 MG HALF TABLET
12.5000 mg | ORAL_TABLET | Freq: Two times a day (BID) | ORAL | Status: DC
  Administered 2020-12-31 – 2021-01-01 (×4): 12.5 mg via ORAL
  Filled 2020-12-31 (×3): qty 1

## 2020-12-31 MED ORDER — ACETAMINOPHEN 325 MG PO TABS
650.0000 mg | ORAL_TABLET | ORAL | Status: DC | PRN
  Administered 2020-12-31 – 2021-01-02 (×3): 650 mg via ORAL
  Filled 2020-12-31 (×3): qty 2

## 2020-12-31 MED ORDER — LABETALOL HCL 5 MG/ML IV SOLN: 10.0000 mg | INTRAVENOUS | Status: AC | PRN

## 2020-12-31 MED ORDER — SODIUM CHLORIDE 0.9 % WEIGHT BASED INFUSION: 3.0000 mL/kg/h | INTRAVENOUS | Status: DC

## 2020-12-31 MED ORDER — FENTANYL CITRATE (PF) 100 MCG/2ML IJ SOLN
INTRAMUSCULAR | Status: AC
  Filled 2020-12-31: qty 2

## 2020-12-31 MED ORDER — HEPARIN (PORCINE) IN NACL 2000-0.9 UNIT/L-% IV SOLN
INTRAVENOUS | Status: AC
  Filled 2020-12-31: qty 1000

## 2020-12-31 MED ORDER — SODIUM CHLORIDE 0.9 % WEIGHT BASED INFUSION
1.0000 mL/kg/h | INTRAVENOUS | Status: DC
  Administered 2020-12-31: 1 mL/kg/h via INTRAVENOUS

## 2020-12-31 MED ORDER — PANTOPRAZOLE SODIUM 40 MG PO TBEC
40.0000 mg | DELAYED_RELEASE_TABLET | Freq: Every day | ORAL | Status: DC
  Administered 2020-12-31 – 2021-01-02 (×3): 40 mg via ORAL
  Filled 2020-12-31 (×4): qty 1

## 2020-12-31 MED ORDER — FENTANYL CITRATE (PF) 100 MCG/2ML IJ SOLN
INTRAMUSCULAR | Status: DC | PRN
  Administered 2020-12-31: 25 ug via INTRAVENOUS

## 2020-12-31 MED ORDER — AMLODIPINE BESYLATE 10 MG PO TABS
10.0000 mg | ORAL_TABLET | Freq: Every day | ORAL | Status: DC
  Administered 2020-12-31 – 2021-01-01 (×2): 10 mg via ORAL
  Filled 2020-12-31: qty 2
  Filled 2020-12-31: qty 1
  Filled 2020-12-31: qty 2

## 2020-12-31 MED ORDER — HEPARIN BOLUS VIA INFUSION
2000.0000 [IU] | Freq: Once | INTRAVENOUS | Status: AC
  Administered 2020-12-31: 2000 [IU] via INTRAVENOUS
  Filled 2020-12-31: qty 2000

## 2020-12-31 MED ORDER — ROSUVASTATIN CALCIUM 20 MG PO TABS
40.0000 mg | ORAL_TABLET | Freq: Every day | ORAL | Status: DC
  Administered 2020-12-31 – 2021-01-01 (×2): 40 mg via ORAL
  Filled 2020-12-31 (×2): qty 2

## 2020-12-31 MED ORDER — CLOPIDOGREL BISULFATE 300 MG PO TABS
ORAL_TABLET | ORAL | Status: DC | PRN
  Administered 2020-12-31: 600 mg via ORAL

## 2020-12-31 MED ORDER — INSULIN ASPART 100 UNIT/ML IJ SOLN
0.0000 [IU] | Freq: Three times a day (TID) | INTRAMUSCULAR | Status: DC
  Administered 2021-01-01: 3 [IU] via SUBCUTANEOUS
  Administered 2021-01-01: 5 [IU] via SUBCUTANEOUS
  Administered 2021-01-01: 8 [IU] via SUBCUTANEOUS
  Administered 2021-01-02: 3 [IU] via SUBCUTANEOUS

## 2020-12-31 MED ORDER — HEPARIN (PORCINE) IN NACL 1000-0.9 UT/500ML-% IV SOLN
INTRAVENOUS | Status: DC | PRN
  Administered 2020-12-31 (×2): 500 mL

## 2020-12-31 MED ORDER — ASPIRIN 81 MG PO CHEW: 81.0000 mg | CHEWABLE_TABLET | Freq: Every day | ORAL | Status: DC

## 2020-12-31 MED ORDER — MIDAZOLAM HCL 2 MG/2ML IJ SOLN
INTRAMUSCULAR | Status: DC | PRN
  Administered 2020-12-31: 1 mg via INTRAVENOUS

## 2020-12-31 MED ORDER — VERAPAMIL HCL 2.5 MG/ML IV SOLN
INTRAVENOUS | Status: DC | PRN
  Administered 2020-12-31: 10 mL via INTRA_ARTERIAL

## 2020-12-31 MED ORDER — SODIUM CHLORIDE 0.9 % IV SOLN: INTRAVENOUS | Status: AC

## 2020-12-31 MED ORDER — ONDANSETRON HCL 4 MG/2ML IJ SOLN: 4.0000 mg | Freq: Four times a day (QID) | INTRAMUSCULAR | Status: DC | PRN

## 2020-12-31 MED ORDER — HEPARIN (PORCINE) 25000 UT/250ML-% IV SOLN
1450.0000 [IU]/h | INTRAVENOUS | Status: DC
Start: 2021-01-01 — End: 2020-12-31

## 2020-12-31 MED ORDER — MIDAZOLAM HCL 2 MG/2ML IJ SOLN
INTRAMUSCULAR | Status: AC
  Filled 2020-12-31: qty 2

## 2020-12-31 MED ORDER — LIDOCAINE HCL (PF) 1 % IJ SOLN
INTRAMUSCULAR | Status: AC
  Filled 2020-12-31: qty 30

## 2020-12-31 MED ORDER — VERAPAMIL HCL 2.5 MG/ML IV SOLN
INTRAVENOUS | Status: AC
  Filled 2020-12-31: qty 2

## 2020-12-31 MED ORDER — ASPIRIN EC 81 MG PO TBEC
81.0000 mg | DELAYED_RELEASE_TABLET | Freq: Every day | ORAL | Status: DC
  Administered 2020-12-31 – 2021-01-02 (×3): 81 mg via ORAL
  Filled 2020-12-31 (×4): qty 1

## 2020-12-31 SURGICAL SUPPLY — 16 items
BALLN SAPPHIRE 2.0X12 (BALLOONS) ×2
BALLOON SAPPHIRE 2.0X12 (BALLOONS) IMPLANT
CATH 5FR JL3.5 JR4 ANG PIG MP (CATHETERS) ×1 IMPLANT
CATH VISTA GUIDE 6FR XBLAD3.5 (CATHETERS) ×1 IMPLANT
DEVICE RAD COMP TR BAND LRG (VASCULAR PRODUCTS) ×1 IMPLANT
GLIDESHEATH SLEND A-KIT 6F 22G (SHEATH) ×1 IMPLANT
GUIDEWIRE INQWIRE 1.5J.035X260 (WIRE) IMPLANT
INQWIRE 1.5J .035X260CM (WIRE) ×4
KIT ENCORE 26 ADVANTAGE (KITS) ×1 IMPLANT
KIT HEART LEFT (KITS) ×2 IMPLANT
PACK CARDIAC CATHETERIZATION (CUSTOM PROCEDURE TRAY) ×2 IMPLANT
SHEATH PROBE COVER 6X72 (BAG) ×1 IMPLANT
STENT ONYX FRONTIER 2.0X12 (Permanent Stent) ×1 IMPLANT
TRANSDUCER W/STOPCOCK (MISCELLANEOUS) ×2 IMPLANT
TUBING CIL FLEX 10 FLL-RA (TUBING) ×2 IMPLANT
WIRE ASAHI PROWATER 180CM (WIRE) ×1 IMPLANT

## 2020-12-31 NOTE — Consult Note (Addendum)
Cardiology Consultation:   Patient ID: Jacob Baker MRN: [id number]; DOB: [birth year]  Admit date: 12/30/2020 Date of Consult: 12/31/2020  PCP:  Jacob Mc, MD   [place] Providers Cardiologist: Jacob Baker  Patient Profile:   Jacob Baker is a 50 y.o. male with a past medical history of HTN, HLD, Type 2 DM, tobacco use and prior DVT who is being seen 12/31/2020 for the evaluation of NSTEMI at the request of Jacob Baker.  History of Present Illness:   Jacob Baker presented to [facility] ED on 12/30/2020 for evaluation of chest pain.  In talking with the patient and his wife today, he reports having intermittent chest pain for the past week which initially started occurring with activity but has now started to occur at rest as well. 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 No nausea, vomiting or diaphoresis. Over the past few nights, he has noticed pain upon lying down to go to sleep as well.  He was taking Motrin and Naproxen with slight improvement but pain would persist and did not specifically improve with positional changes. He denies any associated orthopnea, PND or lower extremity edema. No recent viral illnesses. He does report a strong family history of CAD with his father having required CABG in his 48's. The patient does smoke approximately 1.5 packs/day and has done so since age 48. He denies any alcohol use or recreational drug use.  Initial labs showed WBC 13.2, Hgb 14.4, platelets 313, Na+ 135, K+ 3.5 and creatinine 0.85. Initial Hs Troponin 607 with repeat values of 687 and 415. Negative for COVID and Influenza. CXR with no active cardiopulmonary disease. CTA shows no evidence of aortic dissection or PE. Was noted to have stable lymph nodes unchanged from 2018. Report mentioned no coronary calcifications noted. EKG shows NSR, HR 82 with slight ST  depression along the inferior leads.   He has been started on IV Heparin and ASA 81mg  daily. Continued on PTA Amlodipine 10mg  daily and Crestor 40mg  daily.    Past Medical History:  Diagnosis Date   CHF (congestive heart failure) (HCC)    Diabetes mellitus without complication ([place])    DVT (deep venous thrombosis) ([place]) 04/2017   GERD (gastroesophageal reflux disease)    History of migraine    none in over 2 yrs   Hyperlipidemia    Hypertension    Syncope and collapse    x1 - approx 2-3 yrs ago - dehydration    Past Surgical History:  Procedure Laterality Date   COLONOSCOPY     COLONOSCOPY WITH PROPOFOL N/A 08/06/2017   Procedure: COLONOSCOPY WITH PROPOFOL;  Surgeon: Jacob Lame, MD;  Location: [facility];  Service: Endoscopy;  Laterality: N/A;  Diabetic - oral meds   ESOPHAGEAL DILATION  09/24/2016   Procedure: ESOPHAGEAL DILATION;  Surgeon: Jacob Lame, MD;  Location: [place];  Service: Gastroenterology;;   ESOPHAGOGASTRODUODENOSCOPY (EGD) WITH PROPOFOL N/A 04/08/2015   Procedure: ESOPHAGOGASTRODUODENOSCOPY (EGD) WITH PROPOFOL with dialation;  Surgeon: Jacob Lame, MD;  Location: [place];  Service: Endoscopy;  Laterality: N/A;  diabetic - oral meds   ESOPHAGOGASTRODUODENOSCOPY (EGD) WITH PROPOFOL N/A 09/24/2016   Procedure: ESOPHAGOGASTRODUODENOSCOPY (EGD) WITH PROPOFOL WITH DILATION;  Surgeon: Jacob Lame, MD;  Location: [facility];  Service: Gastroenterology;  Laterality: N/A;  Diabetic - oral meds   FINGER AMPUTATION  2009  partial removal left index finger    POLYPECTOMY  08/06/2017   Procedure: POLYPECTOMY INTESTINAL;  Surgeon: Jacob Lame, MD;  Location: [place];  Service: Endoscopy;;     Home Medications:  Prior to Admission medications   Medication Sig Start Date End Date Taking? Authorizing Provider  albuterol (VENTOLIN HFA) 108 (90 Base) MCG/ACT inhaler Inhale 2 puffs into the lungs every 6 (six) hours as needed  for wheezing or shortness of breath. 04/28/19   Jacob Mc, MD  amLODipine (NORVASC) 10 MG tablet TAKE 1 TABLET(10 MG) BY MOUTH DAILY 08/09/20   Jacob Mc, MD  celecoxib (CELEBREX) 200 MG capsule TAKE 1 CAPSULE(200 MG) BY MOUTH TWICE DAILY 11/19/20   Jacob Mc, MD  cyanocobalamin 1000 MCG tablet Take 100 mcg by mouth daily.    [provider]  dicyclomine (BENTYL) 20 MG tablet Take 1 tablet (20 mg total) by mouth 3 (three) times daily before meals. 07/03/20   Jacob Lame, MD  glipiZIDE (GLUCOTROL XL) 5 MG 24 hr tablet Take 2 tablets (10 mg total) by mouth daily with breakfast. 08/03/20   Jacob Mc, MD  glucose blood (ONETOUCH VERIO) test strip Test sugars twice daily 04/26/14   Baker, Jacob P, MD  metFORMIN (GLUCOPHAGE) 850 MG tablet TAKE 1 TABLET(850 MG) BY MOUTH TWICE DAILY WITH A MEAL. 11/11/20   Jacob Mc, MD  [facility] DELICA LANCETS 75Z MISC Test sugars twice daily 04/26/14   Baker, Jacob P, MD  pantoprazole (PROTONIX) 40 MG tablet TAKE 1 TABLET(40 MG) BY MOUTH DAILY 07/22/20   Jacob Mc, MD  Plecanatide (TRULANCE) 3 MG TABS Take 3 mg by mouth daily. 05/20/20   Jacob Lame, MD  potassium chloride (KLOR-CON) 10 MEQ tablet Take 1 tablet (10 mEq total) by mouth daily. 03/24/20   Jacob Sexton, PA-C  rosuvastatin (CRESTOR) 40 MG tablet Take 1 tablet (40 mg total) by mouth at bedtime. 10/22/19   Jacob Mc, MD  sildenafil (REVATIO) 20 MG tablet Take 1 tablet (20 mg total) by mouth 3 (three) times daily. 07/31/20   Jacob Mc, MD    Inpatient Medications: Scheduled Meds:  amLODipine  10 mg Oral Daily   aspirin EC  81 mg Oral Daily   insulin aspart  0-15 Units Subcutaneous TID WC   insulin aspart  0-5 Units Subcutaneous QHS   pantoprazole  40 mg Oral Daily   rosuvastatin  40 mg Oral QHS   Continuous Infusions:  heparin 1,450 Units/hr (12/31/20 0659)   PRN Meds:   Allergies:   No Known Allergies  Social History:   Social History    Socioeconomic History   Marital status: Married    Spouse name: Not on file   Number of children: Not on file   Years of education: Not on file   Highest education level: Not on file  Occupational History   Not on file  Tobacco Use   Smoking status: Every Day    Packs/day: 2.00    Years: 27.00    Pack years: 54.00    Types: Cigarettes   Smokeless tobacco: Never  Vaping Use   Vaping Use: Never used  Substance and Sexual Activity   Alcohol use: No   Drug use: No   Sexual activity: Not on file  Other Topics Concern   Not on file  Social History Narrative   Not on file   Social Determinants of Health   Financial Resource Strain: Not on file  Food Insecurity: Not on file  Transportation Needs: Not on file  Physical Activity: Not on file  Stress: Not on file  Social Connections: Not on file  Intimate Partner Violence: Not on file    Family History:    Family History  Problem Relation Age of Onset   Heart disease Father        CAD   Hypertension Father    Diabetes Father    Hypertension Sister    Hypertension Brother    Hypertension Brother    Hypertension Mother      ROS:  Please see the history of present illness.   All other ROS reviewed and negative.     Physical Exam/Data:   Vitals:   12/30/20 2302 12/30/20 2320 12/30/20 2334 12/31/20 0330  BP:   (!) 163/103 (!) 141/88  Pulse:   73 75  Resp:   16 16  Temp: 98.4 F (36.9 C)  97.7 F (36.5 C)   TempSrc:   Oral   SpO2:  100% 100% 99%  Weight:   88.8 kg   Height:   6\' 1"  (1.854 m)     Intake/Output Summary (Last 24 hours) at 12/31/2020 0903 Last data filed at 12/31/2020 0500 Gross per 24 hour  Intake 215.68 ml  Output --  Net 215.68 ml   Last 3 Weights 12/30/2020 12/30/2020 12/24/2020  Weight (lbs) 195 lb 12.3 oz 195 lb 195 lb  Weight (kg) 88.8 kg 88.451 kg 88.451 kg     Body mass index is 25.83 kg/m.  General:  Well nourished, well developed, in no acute distress. HEENT: normal Neck: no  JVD Vascular: No carotid bruits; Distal pulses 2+ bilaterally Cardiac:  normal S1, S2; RRR; no murmur. Lungs:  clear to auscultation bilaterally, no wheezing, rhonchi or rales  Abd: soft, nontender, no hepatomegaly  Ext: no edema Musculoskeletal:  No deformities, BUE and BLE strength normal and equal Skin: warm and dry  Neuro:  CNs 2-12 intact, no focal abnormalities noted Psych:  Normal affect   EKG:  The EKG was personally reviewed and demonstrates:  NSR, HR 82 with slight ST depression along the inferior leads.  Telemetry:  Telemetry was personally reviewed and demonstrates: NSR, HR in 60's.   Relevant CV Studies:  Stress Echocardiogram: October 14, 2011 INTERPRETATION     Interpretation: Indeterminate.    Note: 4/10 Resting chest pain, no change with stress    Normal diastolic function    Trivial PR    Mild concentric LVH    Maximum workload of  7.00 METs was achieved during exercise.    Laboratory Data:  High Sensitivity Troponin:   Recent Labs  Lab 12/30/20 1804 12/30/20 2018 12/31/20 0603 12/31/20 0745  TROPONINIHS 607* 687* 415* 408*     Chemistry Recent Labs  Lab 12/30/20 1804 12/31/20 0603  NA 135 137  K 3.5 3.5  CL 104 105  CO2 24 24  GLUCOSE 171* 254*  BUN 17 17  CREATININE 0.85 0.86  CALCIUM 8.8* 8.7*  MG  --  2.1  GFRNONAA >60 >60  ANIONGAP 7 8    Recent Labs  Lab 12/31/20 0603  PROT 6.0*  ALBUMIN 3.8  AST 10*  ALT 12  ALKPHOS 88  BILITOT 0.5   Lipids No results for input(s): CHOL, TRIG, HDL, LABVLDL, LDLCALC, CHOLHDL in the last 168 hours.  Hematology Recent Labs  Lab 12/30/20 1804 12/31/20 0603  WBC 13.2* 9.7  RBC 4.90 4.64  HGB 14.4 13.2  HCT 39.5 37.7*  MCV 80.6 81.3  MCH 29.4 28.4  MCHC 36.5* 35.0  RDW 13.0 12.9  PLT 313 267   Thyroid No results for input(s): TSH, FREET4 in the last 168 hours.  BNPNo results for input(s): BNP, PROBNP in the last 168 hours.  DDimer No results for input(s): DDIMER in the last 168  hours.   Radiology/Studies:  DG Chest 2 View  Result Date: 12/30/2020 CLINICAL DATA:  Chest pain. EXAM: CHEST - 2 VIEW COMPARISON:  Chest radiograph dated 06/11/2020. FINDINGS: The heart size and mediastinal contours are within normal limits. Both lungs are clear. The visualized skeletal structures are unremarkable. IMPRESSION: No active cardiopulmonary disease. Electronically Signed   By: Anner Crete M.D.   On: 12/30/2020 18:36   CT Angio Chest/Abd/Pel for Dissection W and/or W/WO  Result Date: 12/30/2020 CLINICAL DATA:  Chest and abdominal pain for several hours, initial encounter EXAM: CT ANGIOGRAPHY CHEST, ABDOMEN AND PELVIS TECHNIQUE: Non-contrast CT of the chest was initially obtained. Multidetector CT imaging through the chest, abdomen and pelvis was performed using the standard protocol during bolus administration of intravenous contrast. Multiplanar reconstructed images and MIPs were obtained and reviewed to evaluate the vascular anatomy. CONTRAST:  69mL OMNIPAQUE IOHEXOL 350 MG/ML SOLN COMPARISON:  CT from 10/24/2016, 05/29/2020 FINDINGS: CTA CHEST FINDINGS Cardiovascular: Precontrast images show no hyperdense crescent to suggest acute aortic injury. Post-contrast images show no aneurysmal dilatation or dissection. No cardiac enlargement is seen. No coronary calcifications are noted. Pulmonary artery as visualized is within normal limits although not timed for embolus evaluation. Mediastinum/Nodes: Thoracic inlet is within normal limits. No sizable hilar or mediastinal adenopathy is noted. The esophagus is within normal limits. Lungs/Pleura: Small ovoid nodular density is noted along the major fissure on the left best seen on image number 68 consistent with small subpleural lymph node. This is stable from the prior exam and consistent with a benign etiology. No focal infiltrate or sizable effusion is seen. No parenchymal nodules are noted. Musculoskeletal: Degenerative changes of the  thoracic spine are noted. No acute rib abnormality is seen. Review of the MIP images confirms the above findings. CTA ABDOMEN AND PELVIS FINDINGS VASCULAR Aorta: Abdominal aorta shows no aneurysmal dilatation or dissection. Minimal atherosclerotic calcifications are seen. Celiac: Variant anatomy is noted with the common hepatic artery arising directly from the aorta. Left gastric and splenic artery are within normal limits. SMA: Patent without evidence of aneurysm, dissection, vasculitis or significant stenosis. Renals: Both renal arteries are patent without evidence of aneurysm, dissection, vasculitis, fibromuscular dysplasia or significant stenosis. IMA: Patent without evidence of aneurysm, dissection, vasculitis or significant stenosis. Inflow: Iliacs show mild eccentric plaque in the proximal left common iliac artery although no stenosis is seen. No other focal abnormality is noted. Veins: No specific venous abnormality is noted. Review of the MIP images confirms the above findings. NON-VASCULAR Hepatobiliary: Fatty infiltration of the liver is noted. Gallbladder is within normal limits. Pancreas: Unremarkable. No pancreatic ductal dilatation or surrounding inflammatory changes. Spleen: Normal in size without focal abnormality. Adrenals/Urinary Tract: Adrenal glands are within normal limits. Kidneys demonstrate a normal enhancement pattern bilaterally. No renal calculi or obstructive changes are seen. The ureters are within normal limits. Bladder is partially distended. Stomach/Bowel: Appendix is within normal limits. No obstructive or inflammatory changes of the colon seen. Small bowel and stomach are within normal limits. Lymphatic: No significant lymphadenopathy is noted. Reproductive: Prostate is unremarkable. Other: No abdominal wall hernia or abnormality. No abdominopelvic ascites. Musculoskeletal: No acute bony abnormality is noted.  Degenerative changes of lumbar spine are seen. Review of the MIP images  confirms the above findings. IMPRESSION: CTA of the chest: No evidence of aortic dissection or pulmonary embolism. Stable subpleural lymph nodes unchanged from 2018. No acute abnormality is noted. CTA of the abdomen and pelvis: Mild atherosclerotic changes are seen without acute dissection or aneurysmal dilatation. Fatty infiltration of the liver. No other focal abnormality is noted. Electronically Signed   By: Inez Catalina M.D.   On: 12/30/2020 20:19     Assessment and Plan:   1. NSTEMI - He presents with chest pain for the past week which was initially occurring with exertion but does report occasional worsening episodes at night. Reports associated dyspnea as well. - Hs troponin values peaked at 687 and are trending down on most recent check.  CTA negative for a PE or aortic dissection. EKG shows normal sinus rhythm with slight ST depression along the inferior leads. Echocardiogram is pending to assess LV function and wall motion. - Will review with Dr. Harl Bowie but given his enzyme elevation along with multiple cardiac risk factors (HTN, Type 2 DM, HLD, tobacco use and family history of CAD), would anticipate a cardiac catheterization for definitive evaluation. The patient understands that risks include but are not limited to stroke (1 in 1000), death (1 in 29), kidney failure [usually temporary] (1 in 500), bleeding (1 in 200), allergic reaction [possibly serious] (1 in 200). If unrevealing, may need to consider a cMRI given his mixed symptoms.  - Continue ASA, Crestor 40mg  daily and IV Heparin.  2. HTN - BP at 141/88 this AM. He has been continued on PTA Amlodipine 10mg  daily. Pending echo and cath results, may require adjustment in medical therapy.   3. HLD - LDL was elevated to 133 in 07/2020. Will recheck FLP. He remains on Crestor 40mg  daily.   4. Type 2 DM - Hgb A1c at 6.6 in 07/2020. SSI ordered by the admitting team.   5. Tobacco Use - Currently smokes 1.5 ppd. Cessation  advised.   Risk Assessment/Risk Scores:     TIMI Risk Score for Unstable Angina or Non-ST Elevation MI:   The patient's TIMI risk score is 4, which indicates a 20% risk of all cause mortality, Jacob or recurrent myocardial infarction or need for urgent revascularization in the next 14 days.  For questions or updates, please contact [place] Please consult [email or web address] for contact info under    Signed, Erma Heritage, PA-C  12/31/2020 9:03 AM  Attending note  Patient seen and discussed with PA Ahmed Prima, I agree with her documentation. 50 yo male history of HTN, DM2, HTN, COPD, HL, admitted with chest pain. Exertional symptoms that have been progressing.    K 3.5 Cr 0.85 BUN 17 WBC 13.2 Plt 313 Trop 607-->687-->415 COVID neg flu neg CXR no acute process CTA C/A/P: no acute aortic findings, no PE Echo pending EKG SR no acute ischemic changes  Patient with multiple CAD risk factors presents with chest pain and elevated troponin consistent with NSTEMI. Medical therapy with ASA 81, hep gtt, crestor 40mg . Start lopressor 12.5mg  bid, would likely add ACE/ARB after cath. Plan for transfer to [facility] for left heart cath to cardiology service. Prelim read on echo shows normal LVEF without WMAs.    [name] MD

## 2020-12-31 NOTE — Progress Notes (Signed)
[facility] PROGRESS NOTE  Patient: Jacob Baker PZX:[id number]   PCP: [name], MD DOB: Aug 27, 1970   DOA: 12/30/2020   DOS: 12/31/2020    Subjective: Currently no chest pain.  Objective:  Vitals:   12/31/20 0330 12/31/20 1100  BP: (!) 141/88 (!) 152/95  Pulse: 75 73  Resp: 16 20  Temp:  98.8 F (37.1 C)  SpO2: 99% 98%    S1-S2 present. Clear to auscultation. Bowel sound present No edema. No focal deficit.  Assessment and plan: Non-STEMI Plan is to go to [facility] for cardiac catheterization under cardiology service. Appreciate their assistance. Hospitalist will service will Sign off.  Author: Berle Mull, MD [facility] 12/31/2020 12:27 PM   If 7PM-7AM, please contact night-coverage at [email or web address]

## 2020-12-31 NOTE — Interval H&P Note (Signed)
Cath Lab Visit (complete for each Cath Lab visit)  Clinical Evaluation Leading to the Procedure:   ACS: Yes.    Non-ACS:    Anginal Classification: CCS III  Anti-ischemic medical therapy: Minimal Therapy (1 class of medications)  Non-Invasive Test Results: No non-invasive testing performed  Prior CABG: No previous CABG      History and Physical Interval Note:  12/31/2020 4:38 PM  Jacob Baker  has presented today for surgery, with the diagnosis of nstemi.  The various methods of treatment have been discussed with the patient and family. After consideration of risks, benefits and other options for treatment, the patient has consented to  Procedure(s): LEFT HEART CATH AND CORONARY ANGIOGRAPHY (N/A) as a surgical intervention.  The patient's history has been reviewed, patient examined, no change in status, stable for surgery.  I have reviewed the patient's chart and labs.  Questions were answered to the patient's satisfaction.     Belva Crome III

## 2020-12-31 NOTE — Progress Notes (Signed)
ANTICOAGULATION CONSULT NOTE Pharmacy Consult for Heparin Indication: chest pain/ACS  No Known Allergies  Patient Measurements: Height: 6\' 1"  (185.4 cm) Weight: 88.8 kg (195 lb 12.3 oz) IBW/kg (Calculated) : 79.9 Heparin Dosing Weight: 88.5 kg  Vital Signs: Temp: 97.7 F (36.5 C) (12/05 2334) Temp Source: Oral (12/05 2334) BP: 141/88 (12/06 0330) Pulse Rate: 75 (12/06 0330)  Labs: Recent Labs    12/30/20 1804 12/30/20 2018 12/31/20 0603  HGB 14.4  --  13.2  HCT 39.5  --  37.7*  PLT 313  --  267  HEPARINUNFRC  --   --  0.12*  CREATININE 0.85  --   --   TROPONINIHS 607* 687*  --      Estimated Creatinine Clearance: 114.9 mL/min (by C-G formula based on SCr of 0.85 mg/dL).  Assessment: 50 y.o. male with chest pain for heparin   Goal of Therapy:  Heparin level 0.3-0.7 units/ml Monitor platelets by anticoagulation protocol: Yes   Plan:  Heparin 2000 units IV bolus, then increase heparin 1450 units/hr Check heparin level in 6 hours.   Hartwell Vandiver, Bronson Curb, RPh 12/31/2020,6:40 AM

## 2020-12-31 NOTE — Progress Notes (Addendum)
ANTICOAGULATION CONSULT NOTE  Pharmacy Consult for IV Heparin Indication: chest pain/ACS/NSTEMI  No Known Allergies  Patient Measurements: Height: 6\' 1"  (185.4 cm) Weight: 88.8 kg (195 lb 12.3 oz) IBW/kg (Calculated) : 79.9 Heparin Dosing Weight: 88.5 kg  Vital Signs: Temp: 97.9 F (36.6 C) (12/06 1814) Temp Source: Oral (12/06 1814) BP: 155/91 (12/06 1814) Pulse Rate: 67 (12/06 1814)  Labs: Recent Labs    12/30/20 1804 12/30/20 2018 12/31/20 0603 12/31/20 0745 12/31/20 1411  HGB 14.4  --  13.2  --   --   HCT 39.5  --  37.7*  --   --   PLT 313  --  267  --   --   HEPARINUNFRC  --   --  0.12*  --  0.40  CREATININE 0.85  --  0.86  --   --   TROPONINIHS 607* 687* 415* 408*  --     Estimated Creatinine Clearance: 113.6 mL/min (by C-G formula based on SCr of 0.86 mg/dL).  Medical History: Past Medical History:  Diagnosis Date   CHF (congestive heart failure) ([place])    Diabetes mellitus without complication (HCC)    DVT (deep venous thrombosis) ([place]) 04/2017   GERD (gastroesophageal reflux disease)    History of migraine    none in over 2 yrs   Hyperlipidemia    Hypertension    Syncope and collapse    x1 - approx 2-3 yrs ago - dehydration    Assessment: 50 yr old male to begin IV heparin for chest pain/NSTEMI (pt was not on anticoagulant PTA). Pt is S/P cardiac cath this afternoon; pharmacy was consulted to resume IV heparin 8 hrs after sheath removal (per cath lab procedure log, sheath was removed at 1754 PM today).   Pt had therapeutic heparin level of 0.40 units/ml prior to cath; H/H 13.2/37.7, plt 267. Per RN, no bleeding issues observed post cath.  Goal of Therapy:  Heparin level 0.3-0.7 units/ml Monitor platelets by anticoagulation protocol: Yes   Plan:  Restart heparin infusion at 1450 units/hr ~8 hrs post sheath removal (resume heparin at 0200 AM on 12/6) Check heparin level 6 hrs after resuming heparin infusion Monitor daily heparin level,  CBC Monitor for bleeding  Gillermina Hu, PharmD, BCPS, [facility] Clinical Pharmacist 12/31/2020 6:20 PM   ADDENDUM: Heparin consult was discontinued by provider at ~1900

## 2020-12-31 NOTE — Progress Notes (Signed)
ANTICOAGULATION CONSULT NOTE -   Pharmacy Consult for Heparin Indication: chest pain/ACS/NSTEMI  No Known Allergies  Patient Measurements: Height: 6\' 1"  (185.4 cm) Weight: 88.8 kg (195 lb 12.3 oz) IBW/kg (Calculated) : 79.9 Heparin Dosing Weight: 88.5 kg  Vital Signs: Temp: 98.1 F (36.7 C) (12/06 1445) Temp Source: Oral (12/06 1445) BP: 134/101 (12/06 1445) Pulse Rate: 76 (12/06 1445)  Labs: Recent Labs    12/30/20 1804 12/30/20 2018 12/31/20 0603 12/31/20 0745 12/31/20 1411  HGB 14.4  --  13.2  --   --   HCT 39.5  --  37.7*  --   --   PLT 313  --  267  --   --   HEPARINUNFRC  --   --  0.12*  --  0.40  CREATININE 0.85  --  0.86  --   --   TROPONINIHS 607* 687* 415* 408*  --      Estimated Creatinine Clearance: 113.6 mL/min (by C-G formula based on SCr of 0.86 mg/dL).   Medical History: Past Medical History:  Diagnosis Date   CHF (congestive heart failure) ([place])    Diabetes mellitus without complication (HCC)    DVT (deep venous thrombosis) ([place]) 04/2017   GERD (gastroesophageal reflux disease)    History of migraine    none in over 2 yrs   Hyperlipidemia    Hypertension    Syncope and collapse    x1 - approx 2-3 yrs ago - dehydration   Assessment:  50 yr old male to begin IV heparin for chest pain/NSTEMI.  Not on anticoagulants prior to admission.  HL 0.40- therapeutic CBC WNL Trop 687 > 408  Goal of Therapy:  Heparin level 0.3-0.7 units/ml Monitor platelets by anticoagulation protocol: Yes   Plan:  Continue heparin infusion at 1450 units/hr. Heparin level in 6 hours and daily. Monitor H&H and platelets.    Margot Ables, PharmD Clinical Pharmacist 12/31/2020 3:35 PM

## 2020-12-31 NOTE — Progress Notes (Signed)
Arrived from [facility] via [place]. Heparin Infusing. Denies any current pain.

## 2020-12-31 NOTE — Progress Notes (Signed)
*  PRELIMINARY RESULTS* Echocardiogram 2D Echocardiogram has been performed.  Jacob Baker 12/31/2020, 9:26 AM

## 2020-12-31 NOTE — CV Procedure (Signed)
Dominant right coronary with 60% mid stenosis 60% distal and tandem 50% stenoses for the distal. Left main is widely patent Circumflex is relatively small with 2 diffusely diseased obtuse marginals.  The proximal vessel contains 80 to 90% stenosis. The LAD is large contains luminal irregularities but no high-grade obstruction.  The first diagonal is large and branches with the larger branch which is most medial to the LAD contains 99% stenosis just after bifurcating.  This was felt to be the likely culprit and was treated with a 2.0 x 12 mm Onyx frontier stent to 12 atm.  2 balloon inflations were performed. Left main is widely patent. LV gram demonstrated mild mid anterior wall hypokinesis.  EF 55%.  EDP was normal.

## 2020-12-31 NOTE — H&P (View-Only) (Signed)
Cardiology Consultation:   Patient ID: Jacob Baker MRN: [id number]; DOB: 1970-12-08  Admit date: 12/30/2020 Date of Consult: 12/31/2020  PCP:  Jacob Mc, MD   [facility] Cardiologist: Jacob Baker  Patient Profile:   Jacob Baker is a 50 y.o. male with a past medical history of HTN, HLD, Type 2 DM, tobacco use and prior DVT who is being seen 12/31/2020 for the evaluation of NSTEMI at the request of Jacob Baker.  History of Present Illness:   Jacob Baker presented to [facility] on 12/30/2020 for evaluation of chest pain.  In talking with the patient and his wife today, he reports having intermittent chest pain for the past week which initially started occurring with activity but has now started to occur at rest as well. 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 No nausea, vomiting or diaphoresis. Over the past few nights, he has noticed pain upon lying down to go to sleep as well.  He was taking Motrin and Naproxen with slight improvement but pain would persist and did not specifically improve with positional changes. He denies any associated orthopnea, PND or lower extremity edema. No recent viral illnesses. He does report a strong family history of CAD with his father having required CABG in his 55's. The patient does smoke approximately 1.5 packs/day and has done so since age 39. He denies any alcohol use or recreational drug use.  Initial labs showed WBC 13.2, Hgb 14.4, platelets 313, Na+ 135, K+ 3.5 and creatinine 0.85. Initial Hs Troponin 607 with repeat values of 687 and 415. Negative for COVID and Influenza. CXR with no active cardiopulmonary disease. CTA shows no evidence of aortic dissection or PE. Was noted to have stable lymph nodes unchanged from 2018. Report mentioned no coronary calcifications noted. EKG shows NSR, HR 82 with slight ST  depression along the inferior leads.   He has been started on IV Heparin and ASA 81mg  daily. Continued on PTA Amlodipine 10mg  daily and Crestor 40mg  daily.    Past Medical History:  Diagnosis Date   CHF (congestive heart failure) (HCC)    Diabetes mellitus without complication ([place])    DVT (deep venous thrombosis) ([place]) 04/2017   GERD (gastroesophageal reflux disease)    History of migraine    none in over 2 yrs   Hyperlipidemia    Hypertension    Syncope and collapse    x1 - approx 2-3 yrs ago - dehydration    Past Surgical History:  Procedure Laterality Date   COLONOSCOPY     COLONOSCOPY WITH PROPOFOL N/A 08/06/2017   Procedure: COLONOSCOPY WITH PROPOFOL;  Surgeon: [facility] Lame, MD;  Location: [facility];  Service: Endoscopy;  Laterality: N/A;  Diabetic - oral meds   ESOPHAGEAL DILATION  09/24/2016   Procedure: ESOPHAGEAL DILATION;  Surgeon: [facility] Lame, MD;  Location: [facility];  Service: Gastroenterology;;   ESOPHAGOGASTRODUODENOSCOPY (EGD) WITH PROPOFOL N/A 04/08/2015   Procedure: ESOPHAGOGASTRODUODENOSCOPY (EGD) WITH PROPOFOL with dialation;  Surgeon: [facility] Lame, MD;  Location: [facility];  Service: Endoscopy;  Laterality: N/A;  diabetic - oral meds   ESOPHAGOGASTRODUODENOSCOPY (EGD) WITH PROPOFOL N/A 09/24/2016   Procedure: ESOPHAGOGASTRODUODENOSCOPY (EGD) WITH PROPOFOL WITH DILATION;  Surgeon: [facility] Lame, MD;  Location: [facility];  Service: Gastroenterology;  Laterality: N/A;  Diabetic - oral meds   FINGER AMPUTATION  2009  partial removal left index finger    POLYPECTOMY  08/06/2017   Procedure: POLYPECTOMY INTESTINAL;  Surgeon: [facility] Lame, MD;  Location: [facility];  Service: Endoscopy;;     Home Medications:  Prior to Admission medications   Medication Sig Start Date End Date Taking? Authorizing Provider  albuterol (VENTOLIN HFA) 108 (90 Base) MCG/ACT inhaler Inhale 2 puffs into the lungs every 6 (six) hours as needed  for wheezing or shortness of breath. 04/28/19   [facility] Mc, MD  amLODipine (NORVASC) 10 MG tablet TAKE 1 TABLET(10 MG) BY MOUTH DAILY 08/09/20   [facility] Mc, MD  celecoxib (CELEBREX) 200 MG capsule TAKE 1 CAPSULE(200 MG) BY MOUTH TWICE DAILY 11/19/20   [facility] Mc, MD  cyanocobalamin 1000 MCG tablet Take 100 mcg by mouth daily.    [provider]  dicyclomine (BENTYL) 20 MG tablet Take 1 tablet (20 mg total) by mouth 3 (three) times daily before meals. 07/03/20   Jacob Lame, MD  glipiZIDE (GLUCOTROL XL) 5 MG 24 hr tablet Take 2 tablets (10 mg total) by mouth daily with breakfast. 08/03/20   Jacob Mc, MD  glucose blood (ONETOUCH VERIO) test strip Test sugars twice daily 04/26/14   Baker, Jacob P, MD  metFORMIN (GLUCOPHAGE) 850 MG tablet TAKE 1 TABLET(850 MG) BY MOUTH TWICE DAILY WITH A MEAL. 11/11/20   [facility] Mc, MD  [facility] DELICA LANCETS 71I MISC Test sugars twice daily 04/26/14   Baker, Jacob P, MD  pantoprazole (PROTONIX) 40 MG tablet TAKE 1 TABLET(40 MG) BY MOUTH DAILY 07/22/20   Jacob Mc, MD  Plecanatide (TRULANCE) 3 MG TABS Take 3 mg by mouth daily. 05/20/20   Jacob Lame, MD  potassium chloride (KLOR-CON) 10 MEQ tablet Take 1 tablet (10 mEq total) by mouth daily. 03/24/20   Jacob Sexton, PA-C  rosuvastatin (CRESTOR) 40 MG tablet Take 1 tablet (40 mg total) by mouth at bedtime. 10/22/19   Jacob Mc, MD  sildenafil (REVATIO) 20 MG tablet Take 1 tablet (20 mg total) by mouth 3 (three) times daily. 07/31/20   [facility] Mc, MD    Inpatient Medications: Scheduled Meds:  amLODipine  10 mg Oral Daily   aspirin EC  81 mg Oral Daily   insulin aspart  0-15 Units Subcutaneous TID WC   insulin aspart  0-5 Units Subcutaneous QHS   pantoprazole  40 mg Oral Daily   rosuvastatin  40 mg Oral QHS   Continuous Infusions:  heparin 1,450 Units/hr (12/31/20 0659)   PRN Meds:   Allergies:   No Known Allergies  Social History:   Social History    Socioeconomic History   Marital status: Married    Spouse name: Not on file   Number of children: Not on file   Years of education: Not on file   Highest education level: Not on file  Occupational History   Not on file  Tobacco Use   Smoking status: Every Day    Packs/day: 2.00    Years: 27.00    Pack years: 54.00    Types: Cigarettes   Smokeless tobacco: Never  Vaping Use   Vaping Use: Never used  Substance and Sexual Activity   Alcohol use: No   Drug use: No   Sexual activity: Not on file  Other Topics Concern   Not on file  Social History Narrative   Not on file   Social Determinants of Health   Financial Resource Strain: Not on file  Food Insecurity: Not on file  Transportation Needs: Not on file  Physical Activity: Not on file  Stress: Not on file  Social Connections: Not on file  Intimate Partner Violence: Not on file    Family History:    Family History  Problem Relation Age of Onset   Heart disease Father        CAD   Hypertension Father    Diabetes Father    Hypertension Sister    Hypertension Brother    Hypertension Brother    Hypertension Mother      ROS:  Please see the history of present illness.   All other ROS reviewed and negative.     Physical Exam/Data:   Vitals:   12/30/20 2302 12/30/20 2320 12/30/20 2334 12/31/20 0330  BP:   (!) 163/103 (!) 141/88  Pulse:   73 75  Resp:   16 16  Temp: 98.4 F (36.9 C)  97.7 F (36.5 C)   TempSrc:   Oral   SpO2:  100% 100% 99%  Weight:   88.8 kg   Height:   6\' 1"  (1.854 m)     Intake/Output Summary (Last 24 hours) at 12/31/2020 0903 Last data filed at 12/31/2020 0500 Gross per 24 hour  Intake 215.68 ml  Output --  Net 215.68 ml   Last 3 Weights 12/30/2020 12/30/2020 12/24/2020  Weight (lbs) 195 lb 12.3 oz 195 lb 195 lb  Weight (kg) 88.8 kg 88.451 kg 88.451 kg     Body mass index is 25.83 kg/m.  General:  Well nourished, well developed, in no acute distress. HEENT: normal Neck: no  JVD Vascular: No carotid bruits; Distal pulses 2+ bilaterally Cardiac:  normal S1, S2; RRR; no murmur. Lungs:  clear to auscultation bilaterally, no wheezing, rhonchi or rales  Abd: soft, nontender, no hepatomegaly  Ext: no edema Musculoskeletal:  No deformities, BUE and BLE strength normal and equal Skin: warm and dry  Neuro:  CNs 2-12 intact, no focal abnormalities noted Psych:  Normal affect   EKG:  The EKG was personally reviewed and demonstrates:  NSR, HR 82 with slight ST depression along the inferior leads.  Telemetry:  Telemetry was personally reviewed and demonstrates: NSR, HR in 60's.   Relevant CV Studies:  Stress Echocardiogram: 10-15-2011 INTERPRETATION     Interpretation: Indeterminate.    Note: 4/10 Resting chest pain, no change with stress    Normal diastolic function    Trivial PR    Mild concentric LVH    Maximum workload of  7.00 METs was achieved during exercise.    Laboratory Data:  High Sensitivity Troponin:   Recent Labs  Lab 12/30/20 1804 12/30/20 2018 12/31/20 0603 12/31/20 0745  TROPONINIHS 607* 687* 415* 408*     Chemistry Recent Labs  Lab 12/30/20 1804 12/31/20 0603  NA 135 137  K 3.5 3.5  CL 104 105  CO2 24 24  GLUCOSE 171* 254*  BUN 17 17  CREATININE 0.85 0.86  CALCIUM 8.8* 8.7*  MG  --  2.1  GFRNONAA >60 >60  ANIONGAP 7 8    Recent Labs  Lab 12/31/20 0603  PROT 6.0*  ALBUMIN 3.8  AST 10*  ALT 12  ALKPHOS 88  BILITOT 0.5   Lipids No results for input(s): CHOL, TRIG, HDL, LABVLDL, LDLCALC, CHOLHDL in the last 168 hours.  Hematology Recent Labs  Lab 12/30/20 1804 12/31/20 0603  WBC 13.2* 9.7  RBC 4.90 4.64  HGB 14.4 13.2  HCT 39.5 37.7*  MCV 80.6 81.3  MCH 29.4 28.4  MCHC 36.5* 35.0  RDW 13.0 12.9  PLT 313 267   Thyroid No results for input(s): TSH, FREET4 in the last 168 hours.  BNPNo results for input(s): BNP, PROBNP in the last 168 hours.  DDimer No results for input(s): DDIMER in the last 168  hours.   Radiology/Studies:  DG Chest 2 View  Result Date: 12/30/2020 CLINICAL DATA:  Chest pain. EXAM: CHEST - 2 VIEW COMPARISON:  Chest radiograph dated 06/11/2020. FINDINGS: The heart size and mediastinal contours are within normal limits. Both lungs are clear. The visualized skeletal structures are unremarkable. IMPRESSION: No active cardiopulmonary disease. Electronically Signed   By: Anner Crete M.D.   On: 12/30/2020 18:36   CT Angio Chest/Abd/Pel for Dissection W and/or W/WO  Result Date: 12/30/2020 CLINICAL DATA:  Chest and abdominal pain for several hours, initial encounter EXAM: CT ANGIOGRAPHY CHEST, ABDOMEN AND PELVIS TECHNIQUE: Non-contrast CT of the chest was initially obtained. Multidetector CT imaging through the chest, abdomen and pelvis was performed using the standard protocol during bolus administration of intravenous contrast. Multiplanar reconstructed images and MIPs were obtained and reviewed to evaluate the vascular anatomy. CONTRAST:  8mL OMNIPAQUE IOHEXOL 350 MG/ML SOLN COMPARISON:  CT from 10/24/2016, 05/29/2020 FINDINGS: CTA CHEST FINDINGS Cardiovascular: Precontrast images show no hyperdense crescent to suggest acute aortic injury. Post-contrast images show no aneurysmal dilatation or dissection. No cardiac enlargement is seen. No coronary calcifications are noted. Pulmonary artery as visualized is within normal limits although not timed for embolus evaluation. Mediastinum/Nodes: Thoracic inlet is within normal limits. No sizable hilar or mediastinal adenopathy is noted. The esophagus is within normal limits. Lungs/Pleura: Small ovoid nodular density is noted along the major fissure on the left best seen on image number 68 consistent with small subpleural lymph node. This is stable from the prior exam and consistent with a benign etiology. No focal infiltrate or sizable effusion is seen. No parenchymal nodules are noted. Musculoskeletal: Degenerative changes of the  thoracic spine are noted. No acute rib abnormality is seen. Review of the MIP images confirms the above findings. CTA ABDOMEN AND PELVIS FINDINGS VASCULAR Aorta: Abdominal aorta shows no aneurysmal dilatation or dissection. Minimal atherosclerotic calcifications are seen. Celiac: Variant anatomy is noted with the common hepatic artery arising directly from the aorta. Left gastric and splenic artery are within normal limits. SMA: Patent without evidence of aneurysm, dissection, vasculitis or significant stenosis. Renals: Both renal arteries are patent without evidence of aneurysm, dissection, vasculitis, fibromuscular dysplasia or significant stenosis. IMA: Patent without evidence of aneurysm, dissection, vasculitis or significant stenosis. Inflow: Iliacs show mild eccentric plaque in the proximal left common iliac artery although no stenosis is seen. No other focal abnormality is noted. Veins: No specific venous abnormality is noted. Review of the MIP images confirms the above findings. NON-VASCULAR Hepatobiliary: Fatty infiltration of the liver is noted. Gallbladder is within normal limits. Pancreas: Unremarkable. No pancreatic ductal dilatation or surrounding inflammatory changes. Spleen: Normal in size without focal abnormality. Adrenals/Urinary Tract: Adrenal glands are within normal limits. Kidneys demonstrate a normal enhancement pattern bilaterally. No renal calculi or obstructive changes are seen. The ureters are within normal limits. Bladder is partially distended. Stomach/Bowel: Appendix is within normal limits. No obstructive or inflammatory changes of the colon seen. Small bowel and stomach are within normal limits. Lymphatic: No significant lymphadenopathy is noted. Reproductive: Prostate is unremarkable. Other: No abdominal wall hernia or abnormality. No abdominopelvic ascites. Musculoskeletal: No acute bony abnormality is noted.  Degenerative changes of lumbar spine are seen. Review of the MIP images  confirms the above findings. IMPRESSION: CTA of the chest: No evidence of aortic dissection or pulmonary embolism. Stable subpleural lymph nodes unchanged from 2018. No acute abnormality is noted. CTA of the abdomen and pelvis: Mild atherosclerotic changes are seen without acute dissection or aneurysmal dilatation. Fatty infiltration of the liver. No other focal abnormality is noted. Electronically Signed   By: Inez Catalina M.D.   On: 12/30/2020 20:19     Assessment and Plan:   1. NSTEMI - He presents with chest pain for the past week which was initially occurring with exertion but does report occasional worsening episodes at night. Reports associated dyspnea as well. - Hs troponin values peaked at 687 and are trending down on most recent check.  CTA negative for a PE or aortic dissection. EKG shows normal sinus rhythm with slight ST depression along the inferior leads. Echocardiogram is pending to assess LV function and wall motion. - Will review with Dr. Harl Bowie but given his enzyme elevation along with multiple cardiac risk factors (HTN, Type 2 DM, HLD, tobacco use and family history of CAD), would anticipate a cardiac catheterization for definitive evaluation. The patient understands that risks include but are not limited to stroke (1 in 1000), death (1 in 68), kidney failure [usually temporary] (1 in 500), bleeding (1 in 200), allergic reaction [possibly serious] (1 in 200). If unrevealing, may need to consider a cMRI given his mixed symptoms.  - Continue ASA, Crestor 40mg  daily and IV Heparin.  2. HTN - BP at 141/88 this AM. He has been continued on PTA Amlodipine 10mg  daily. Pending echo and cath results, may require adjustment in medical therapy.   3. HLD - LDL was elevated to 133 in 07/2020. Will recheck FLP. He remains on Crestor 40mg  daily.   4. Type 2 DM - Hgb A1c at 6.6 in 07/2020. SSI ordered by the admitting team.   5. Tobacco Use - Currently smokes 1.5 ppd. Cessation  advised.   Risk Assessment/Risk Scores:     TIMI Risk Score for Unstable Angina or Non-ST Elevation MI:   The patient's TIMI risk score is 4, which indicates a 20% risk of all cause mortality, Jacob or recurrent myocardial infarction or need for urgent revascularization in the next 14 days.  For questions or updates, please contact [place] Please consult [email or web address] for contact info under    Signed, Erma Heritage, PA-C  12/31/2020 9:03 AM  Attending note  Patient seen and discussed with PA [facility], I agree with her documentation. 50 yo male history of HTN, DM2, HTN, COPD, HL, admitted with chest pain. Exertional symptoms that have been progressing.    K 3.5 Cr 0.85 BUN 17 WBC 13.2 Plt 313 Trop 607-->687-->415 COVID neg flu neg CXR no acute process CTA C/A/P: no acute aortic findings, no PE Echo pending EKG SR no acute ischemic changes  Patient with multiple CAD risk factors presents with chest pain and elevated troponin consistent with NSTEMI. Medical therapy with ASA 81, hep gtt, crestor 40mg . Start lopressor 12.5mg  bid, would likely add ACE/ARB after cath. Plan for transfer to [facility] for left heart cath to cardiology service. Prelim read on echo shows normal LVEF without WMAs.    [name] MD

## 2021-01-01 ENCOUNTER — Encounter: Payer: BC Managed Care – PPO | Admitting: Primary Care

## 2021-01-01 ENCOUNTER — Other Ambulatory Visit (HOSPITAL_COMMUNITY): Payer: Self-pay

## 2021-01-01 ENCOUNTER — Ambulatory Visit: Payer: BC Managed Care – PPO | Attending: Acute Care

## 2021-01-01 ENCOUNTER — Encounter (HOSPITAL_COMMUNITY): Payer: Self-pay | Admitting: Interventional Cardiology

## 2021-01-01 ENCOUNTER — Encounter (HOSPITAL_COMMUNITY)
Admission: EM | Disposition: A | Discharge status: Home / Self Care | Payer: Self-pay | Source: Home / Self Care | Attending: Cardiology

## 2021-01-01 DIAGNOSIS — I25110 Atherosclerotic heart disease of native coronary artery with unstable angina pectoris: Secondary | ICD-10-CM

## 2021-01-01 DIAGNOSIS — E782 Mixed hyperlipidemia: Secondary | ICD-10-CM

## 2021-01-01 DIAGNOSIS — R778 Other specified abnormalities of plasma proteins: Secondary | ICD-10-CM

## 2021-01-01 HISTORY — PX: CORONARY STENT INTERVENTION: CATH118234

## 2021-01-01 HISTORY — PX: LEFT HEART CATH AND CORONARY ANGIOGRAPHY: CATH118249

## 2021-01-01 LAB — BASIC METABOLIC PANEL
Anion gap: 8 (ref 5–15)
BUN: 13 mg/dL (ref 6–20)
CO2: 25 mmol/L (ref 22–32)
Calcium: 8.7 mg/dL — ABNORMAL LOW (ref 8.9–10.3)
Chloride: 103 mmol/L (ref 98–111)
Creatinine, Ser: 1.03 mg/dL (ref 0.61–1.24)
GFR, Estimated: >60 mL/min (ref >60)
Glucose, Bld: 236 mg/dL — ABNORMAL HIGH (ref 70–99)
Potassium: 3.6 mmol/L (ref 3.5–5.1)
Sodium: 136 mmol/L (ref 135–145)

## 2021-01-01 LAB — GLUCOSE, CAPILLARY
Glucose-Capillary: 267 mg/dL — ABNORMAL HIGH (ref 70–99)
Glucose-Capillary: 216 mg/dL — ABNORMAL HIGH (ref 70–99)
Glucose-Capillary: 175 mg/dL — ABNORMAL HIGH (ref 70–99)
Glucose-Capillary: 156 mg/dL — ABNORMAL HIGH (ref 70–99)

## 2021-01-01 LAB — CBC
HCT: 36.3 % — ABNORMAL LOW (ref 39.0–52.0)
Hemoglobin: 13 g/dL (ref 13.0–17.0)
MCH: 28.3 pg (ref 26.0–34.0)
MCHC: 35.8 g/dL (ref 30.0–36.0)
MCV: 78.9 fL — ABNORMAL LOW (ref 80.0–100.0)
Platelets: 239 10*3/uL (ref 150–400)
RBC: 4.6 MIL/uL (ref 4.22–5.81)
RDW: 12.9 % (ref 11.5–15.5)
WBC: 9.9 10*3/uL (ref 4.0–10.5)
nRBC: 0 % (ref 0.0–0.2)

## 2021-01-01 LAB — LIPID PANEL
Cholesterol: 248 mg/dL — ABNORMAL HIGH (ref 0–200)
HDL: 30 mg/dL — ABNORMAL LOW (ref >40)
LDL Cholesterol: 178 mg/dL — ABNORMAL HIGH (ref 0–99)
Total CHOL/HDL Ratio: 8.3 RATIO
Triglycerides: 202 mg/dL — ABNORMAL HIGH (ref <150)
VLDL: 40 mg/dL (ref 0–40)

## 2021-01-01 LAB — POCT ACTIVATED CLOTTING TIME: Activated Clotting Time: 275 seconds

## 2021-01-01 SURGERY — CORONARY STENT INTERVENTION
Anesthesia: LOCAL

## 2021-01-01 MED ORDER — MIDAZOLAM HCL 2 MG/2ML IJ SOLN
INTRAMUSCULAR | Status: DC | PRN
  Administered 2021-01-01: 1 mg via INTRAVENOUS
  Administered 2021-01-01: 2 mg via INTRAVENOUS

## 2021-01-01 MED ORDER — VERAPAMIL HCL 2.5 MG/ML IV SOLN
INTRAVENOUS | Status: AC
  Filled 2021-01-01: qty 2

## 2021-01-01 MED ORDER — SODIUM CHLORIDE 0.9 % IV SOLN: 250.0000 mL | INTRAVENOUS | Status: DC | PRN

## 2021-01-01 MED ORDER — FENTANYL CITRATE (PF) 100 MCG/2ML IJ SOLN
INTRAMUSCULAR | Status: DC | PRN
  Administered 2021-01-01: 25 ug via INTRAVENOUS
  Administered 2021-01-01: 50 ug via INTRAVENOUS

## 2021-01-01 MED ORDER — VERAPAMIL HCL 2.5 MG/ML IV SOLN
INTRAVENOUS | Status: DC | PRN
  Administered 2021-01-01: 10 mL via INTRA_ARTERIAL

## 2021-01-01 MED ORDER — FENTANYL CITRATE (PF) 100 MCG/2ML IJ SOLN
INTRAMUSCULAR | Status: AC
  Filled 2021-01-01: qty 2

## 2021-01-01 MED ORDER — SODIUM CHLORIDE 0.9 % WEIGHT BASED INFUSION
1.0000 mL/kg/h | INTRAVENOUS | Status: DC
  Administered 2021-01-01: 1 mL/kg/h via INTRAVENOUS

## 2021-01-01 MED ORDER — HEPARIN (PORCINE) IN NACL 2000-0.9 UNIT/L-% IV SOLN
INTRAVENOUS | Status: DC | PRN
  Administered 2021-01-01: 1000 mL

## 2021-01-01 MED ORDER — SODIUM CHLORIDE 0.9% FLUSH: 3.0000 mL | INTRAVENOUS | Status: DC | PRN

## 2021-01-01 MED ORDER — LIDOCAINE HCL (PF) 1 % IJ SOLN
INTRAMUSCULAR | Status: AC
  Filled 2021-01-01: qty 30

## 2021-01-01 MED ORDER — LISINOPRIL 40 MG PO TABS: 40.0000 mg | ORAL_TABLET | Freq: Every day | ORAL | Status: DC

## 2021-01-01 MED ORDER — HEPARIN SODIUM (PORCINE) 1000 UNIT/ML IJ SOLN
INTRAMUSCULAR | Status: DC | PRN
  Administered 2021-01-01: 10000 [IU] via INTRAVENOUS
  Administered 2021-01-01: 3000 [IU] via INTRAVENOUS

## 2021-01-01 MED ORDER — MIDAZOLAM HCL 2 MG/2ML IJ SOLN
INTRAMUSCULAR | Status: AC
  Filled 2021-01-01: qty 2

## 2021-01-01 MED ORDER — ONDANSETRON HCL 4 MG/2ML IJ SOLN: 4.0000 mg | Freq: Four times a day (QID) | INTRAMUSCULAR | Status: DC | PRN

## 2021-01-01 MED ORDER — HEPARIN SODIUM (PORCINE) 1000 UNIT/ML IJ SOLN
INTRAMUSCULAR | Status: AC
  Filled 2021-01-01: qty 10

## 2021-01-01 MED ORDER — SODIUM CHLORIDE 0.9% FLUSH
3.0000 mL | Freq: Two times a day (BID) | INTRAVENOUS | Status: DC
  Administered 2021-01-01: 3 mL via INTRAVENOUS

## 2021-01-01 MED ORDER — LIDOCAINE HCL (PF) 1 % IJ SOLN
INTRAMUSCULAR | Status: DC | PRN
  Administered 2021-01-01: 2 mL

## 2021-01-01 MED ORDER — LABETALOL HCL 5 MG/ML IV SOLN: 10.0000 mg | INTRAVENOUS | Status: AC | PRN

## 2021-01-01 MED ORDER — CLOPIDOGREL BISULFATE 75 MG PO TABS: 75.0000 mg | ORAL_TABLET | Freq: Every day | ORAL | Status: DC

## 2021-01-01 MED ORDER — HYDRALAZINE HCL 20 MG/ML IJ SOLN: 10.0000 mg | INTRAMUSCULAR | Status: AC | PRN

## 2021-01-01 MED ORDER — SODIUM CHLORIDE 0.9 % WEIGHT BASED INFUSION
3.0000 mL/kg/h | INTRAVENOUS | Status: DC
  Administered 2021-01-01: 3 mL/kg/h via INTRAVENOUS

## 2021-01-01 MED ORDER — HEPARIN (PORCINE) IN NACL 2000-0.9 UNIT/L-% IV SOLN
INTRAVENOUS | Status: AC
  Filled 2021-01-01: qty 1000

## 2021-01-01 MED ORDER — NITROGLYCERIN 1 MG/10 ML FOR IR/CATH LAB
INTRA_ARTERIAL | Status: DC | PRN
  Administered 2021-01-01: 400 ug via INTRA_ARTERIAL

## 2021-01-01 MED ORDER — SODIUM CHLORIDE 0.9 % IV SOLN: INTRAVENOUS | Status: AC

## 2021-01-01 MED ORDER — NITROGLYCERIN 1 MG/10 ML FOR IR/CATH LAB
INTRA_ARTERIAL | Status: AC
  Filled 2021-01-01: qty 10

## 2021-01-01 MED ORDER — IOHEXOL 350 MG/ML SOLN
INTRAVENOUS | Status: DC | PRN
  Administered 2021-01-01: 70 mL

## 2021-01-01 SURGICAL SUPPLY — 21 items
BALLN  ~~LOC~~ SAPPHIRE 4.5X12 (BALLOONS) ×1
BALLN SAPPHIRE 3.0X15 (BALLOONS) ×2
BALLN ~~LOC~~ SAPPHIRE 4.5X12 (BALLOONS) ×1
BALLOON SAPPHIRE 3.0X15 (BALLOONS) ×1 IMPLANT
BALLOON ~~LOC~~ SAPPHIRE 4.5X12 (BALLOONS) ×1 IMPLANT
CATH DRAGONFLY OPTIS 2.7FR (CATHETERS) ×2 IMPLANT
CATH INFINITI 5 FR JL3.5 (CATHETERS) ×2 IMPLANT
CATH VISTA GUIDE 6FR XBRCA (CATHETERS) ×2 IMPLANT
DEVICE RAD COMP TR BAND LRG (VASCULAR PRODUCTS) ×2 IMPLANT
GLIDESHEATH SLEND SS 6F .021 (SHEATH) ×2 IMPLANT
GUIDEWIRE INQWIRE 1.5J.035X260 (WIRE) ×1 IMPLANT
INQWIRE 1.5J .035X260CM (WIRE) ×2
KIT ENCORE 26 ADVANTAGE (KITS) ×2 IMPLANT
KIT HEART LEFT (KITS) ×2 IMPLANT
KIT HEMO VALVE WATCHDOG (MISCELLANEOUS) ×2 IMPLANT
PACK CARDIAC CATHETERIZATION (CUSTOM PROCEDURE TRAY) ×2 IMPLANT
STENT ONYX FRONTIER 4.0X18 (Permanent Stent) ×2 IMPLANT
TRANSDUCER W/STOPCOCK (MISCELLANEOUS) ×2 IMPLANT
TUBING CIL FLEX 10 FLL-RA (TUBING) ×2 IMPLANT
WIRE ASAHI PROWATER 180CM (WIRE) ×2 IMPLANT
WIRE HI TORQ BMW 190CM (WIRE) ×2 IMPLANT

## 2021-01-01 NOTE — Progress Notes (Signed)
Stated chest pain 4 out of 10 around 23:00 this shift, and asked for PRN tylenol. Given and patient fell back asleep without signs of pain. TR band off per protocol with no complications. Anticipating possible discharge today.

## 2021-01-01 NOTE — Progress Notes (Signed)
CARDIAC REHAB PHASE I   Offered to walk with pt. Pt declines at this time, had some CP this morning. Stent/MI education completed with pt. Pt educated on importance of ASA, Plavix, statin, and NTG. Pt given MI book along with heart healthy and diabetic diets. Pt given smoking cessation tip sheet. Will refer to CRP II [place].   [id number] Rufina Falco, RN BSN 01/01/2021 8:45 AM

## 2021-01-01 NOTE — H&P (View-Only) (Signed)
Progress Note  Patient Name: Jacob Baker Date of Encounter: 01/01/2021  [place] Cardiologist: None   Subjective   Intermittent chest pain overnight into this morning. Rates 4/10.   Inpatient Medications    Scheduled Meds:  amLODipine  10 mg Oral Daily   aspirin EC  81 mg Oral Daily   clopidogrel  75 mg Oral Q breakfast   insulin aspart  0-15 Units Subcutaneous TID WC   insulin aspart  0-5 Units Subcutaneous QHS   metoprolol tartrate  12.5 mg Oral BID   pantoprazole  40 mg Oral Daily   rosuvastatin  40 mg Oral QHS   sodium chloride flush  3 mL Intravenous Q12H   Continuous Infusions:  sodium chloride 250 mL (12/31/20 2320)   PRN Meds: sodium chloride, acetaminophen, ondansetron (ZOFRAN) IV, sodium chloride flush   Vital Signs    Vitals:   12/31/20 1745 12/31/20 1750 12/31/20 1814 01/01/21 0629  BP: (!) 153/94 (!) 163/96 (!) 155/91 131/87  Pulse: 71 69 67 70  Resp: 15 20  18   Temp:   97.9 F (36.6 C) 98.2 F (36.8 C)  TempSrc:   Oral Oral  SpO2: 100% 100% 100% 98%  Weight:      Height:        Intake/Output Summary (Last 24 hours) at 01/01/2021 1012 Last data filed at 12/31/2020 2320 Gross per 24 hour  Intake 539.58 ml  Output --  Net 539.58 ml   Last 3 Weights 12/30/2020 12/30/2020 12/24/2020  Weight (lbs) 195 lb 12.3 oz 195 lb 195 lb  Weight (kg) 88.8 kg 88.451 kg 88.451 kg      Telemetry    SR, PVC - Personally Reviewed  ECG    SR, 64 bpm - Personally Reviewed  Physical Exam   GEN: No acute distress.   Neck: No JVD Cardiac: RRR, no murmurs, rubs, or gallops.  Respiratory: Clear to auscultation bilaterally. GI: Soft, nontender, non-distended  MS: No edema; No deformity. Right radial cath site stable.  Neuro:  Nonfocal  Psych: Normal affect   Labs    High Sensitivity Troponin:   Recent Labs  Lab 12/30/20 1804 12/30/20 2018 12/31/20 0603 12/31/20 0745  TROPONINIHS 607* 687* 415* 408*     Chemistry Recent Labs  Lab  12/30/20 1804 12/31/20 0603 01/01/21 0406  NA 135 137 136  K 3.5 3.5 3.6  CL 104 105 103  CO2 24 24 25   GLUCOSE 171* 254* 236*  BUN 17 17 13   CREATININE 0.85 0.86 1.03  CALCIUM 8.8* 8.7* 8.7*  MG  --  2.1  --   PROT  --  6.0*  --   ALBUMIN  --  3.8  --   AST  --  10*  --   ALT  --  12  --   ALKPHOS  --  88  --   BILITOT  --  0.5  --   GFRNONAA >60 >60 >60  ANIONGAP 7 8 8     Lipids  Recent Labs  Lab 01/01/21 0406  CHOL 248*  TRIG 202*  HDL 30*  LDLCALC 178*  CHOLHDL 8.3    Hematology Recent Labs  Lab 12/30/20 1804 12/31/20 0603 01/01/21 0406  WBC 13.2* 9.7 9.9  RBC 4.90 4.64 4.60  HGB 14.4 13.2 13.0  HCT 39.5 37.7* 36.3*  MCV 80.6 81.3 78.9*  MCH 29.4 28.4 28.3  MCHC 36.5* 35.0 35.8  RDW 13.0 12.9 12.9  PLT 313 267 239   Thyroid  No results for input(s): TSH, FREET4 in the last 168 hours.  BNPNo results for input(s): BNP, PROBNP in the last 168 hours.  DDimer No results for input(s): DDIMER in the last 168 hours.   Radiology    DG Chest 2 View  Result Date: 12/30/2020 CLINICAL DATA:  Chest pain. EXAM: CHEST - 2 VIEW COMPARISON:  Chest radiograph dated 06/11/2020. FINDINGS: The heart size and mediastinal contours are within normal limits. Both lungs are clear. The visualized skeletal structures are unremarkable. IMPRESSION: No active cardiopulmonary disease. Electronically Signed   By: Anner Crete M.D.   On: 12/30/2020 18:36   CARDIAC CATHETERIZATION  Result Date: 12/31/2020 CONCLUSIONS: High-grade stenosis in the large mid diagonal branch 99% reduced to 0% with TIMI-3 flow using a 2.0 x 12 Onyx frontier.  Chest discomfort was dissimilar to presenting complaint. Left main is normal LAD has mild diffuse disease Circumflex contains 80 to 90% proximal stenosis.  The vessel is diffusely diseased and gives origin to 2-2 small obtuse marginal branches.  If symptoms continue, perhaps this proximal lesion should be stented. RCA has a "rosary bead "appearance.  It  is a large vessel.  There is 60% mid, 60% distal, diffuse disease in the PDA and LV branches. Left ventriculography demonstrated mid anterior wall hypokinesis.  EF 50%.  LVEDP is normal. RECOMMENDATIONS: Aggressive risk factor modification. Smoking cessation. Monitor for recurrent symptoms and consider PCI of circumflex if symptoms do not abate with PCI of this moderate-sized diagonal branch  ECHOCARDIOGRAM COMPLETE  Result Date: 12/31/2020    ECHOCARDIOGRAM REPORT   Patient Name:   Jacob Baker Date of Exam: 12/31/2020 Medical Rec #:  [id number]      Height:       73.0 in Accession #:    [id number]     Weight:       195.8 lb Date of Birth:  11/07/1970      BSA:          2.132 m Patient Age:    50 years       BP:           141/88 mmHg Patient Gender: M              HR:           75 bpm. Exam Location:  [facility] Procedure: 2D Echo, Cardiac Doppler and Color Doppler Indications:    NSTEMI  History:        Patient has no prior history of Echocardiogram examinations.                 Signs/Symptoms:Chest Pain; Risk Factors:Hypertension, Diabetes,                 Dyslipidemia and Current Smoker.  Sonographer:    Wenda Low Referring Phys: [id number] [place]  1. Left ventricular ejection fraction, by estimation, is 65 to 70%. The left ventricle has normal function. The left ventricle has no regional wall motion abnormalities. There is mild left ventricular hypertrophy. Left ventricular diastolic parameters were normal.  2. Right ventricular systolic function is normal. The right ventricular size is normal. Tricuspid regurgitation signal is inadequate for assessing PA pressure.  3. The mitral valve is normal in structure. Trivial mitral valve regurgitation. No evidence of mitral stenosis.  4. The aortic valve is tricuspid. Aortic valve regurgitation is not visualized. No aortic stenosis is present.  5. The inferior vena cava is normal in size with greater than 50% respiratory variability,  suggesting right atrial pressure of 3 mmHg. FINDINGS  Left Ventricle: Left ventricular ejection fraction, by estimation, is 65 to 70%. The left ventricle has normal function. The left ventricle has no regional wall motion abnormalities. The left ventricular internal cavity size was normal in size. There is  mild left ventricular hypertrophy. Left ventricular diastolic parameters were normal. Right Ventricle: The right ventricular size is normal. No increase in right ventricular wall thickness. Right ventricular systolic function is normal. Tricuspid regurgitation signal is inadequate for assessing PA pressure. Left Atrium: Left atrial size was normal in size. Right Atrium: Right atrial size was normal in size. Pericardium: There is no evidence of pericardial effusion. Mitral Valve: The mitral valve is normal in structure. Trivial mitral valve regurgitation. No evidence of mitral valve stenosis. MV peak gradient, 2.3 mmHg. The mean mitral valve gradient is 1.0 mmHg. Tricuspid Valve: The tricuspid valve is normal in structure. Tricuspid valve regurgitation is not demonstrated. No evidence of tricuspid stenosis. Aortic Valve: The aortic valve is tricuspid. Aortic valve regurgitation is not visualized. No aortic stenosis is present. Aortic valve mean gradient measures 3.0 mmHg. Aortic valve peak gradient measures 5.2 mmHg. Aortic valve area, by VTI measures 3.29 cm. Pulmonic Valve: The pulmonic valve was not well visualized. Pulmonic valve regurgitation is mild. No evidence of pulmonic stenosis. Aorta: The aortic root is normal in size and structure. Venous: The inferior vena cava is normal in size with greater than 50% respiratory variability, suggesting right atrial pressure of 3 mmHg. IAS/Shunts: No atrial level shunt detected by color flow Doppler.  LEFT VENTRICLE PLAX 2D LVIDd:         4.60 cm     Diastology LVIDs:         2.60 cm     LV e' medial:    5.35 cm/s LV PW:         1.10 cm     LV E/e' medial:  12.3 LV  IVS:        1.10 cm     LV e' lateral:   11.80 cm/s LVOT diam:     2.30 cm     LV E/e' lateral: 5.6 LV SV:         77 LV SV Index:   36 LVOT Area:     4.15 cm  LV Volumes (MOD) LV vol d, MOD A2C: 96.8 ml LV vol d, MOD A4C: 62.1 ml LV vol s, MOD A2C: 43.7 ml LV vol s, MOD A4C: 26.9 ml LV SV MOD A2C:     53.1 ml LV SV MOD A4C:     62.1 ml LV SV MOD BP:      43.8 ml RIGHT VENTRICLE RV Basal diam:  3.40 cm RV Mid diam:    3.40 cm RV S prime:     14.80 cm/s TAPSE (M-mode): 2.7 cm LEFT ATRIUM             Index        RIGHT ATRIUM           Index LA diam:        3.50 cm 1.64 cm/m   RA Area:     22.10 cm LA Vol (A2C):   72.4 ml 33.96 ml/m  RA Volume:   68.70 ml  32.22 ml/m LA Vol (A4C):   53.5 ml 25.09 ml/m LA Biplane Vol: 64.9 ml 30.44 ml/m  AORTIC VALVE  PULMONIC VALVE AV Area (Vmax):    3.26 cm     PV Vmax:       0.73 m/s AV Area (Vmean):   3.02 cm     PV Peak grad:  2.1 mmHg AV Area (VTI):     3.29 cm AV Vmax:           114.00 cm/s AV Vmean:          79.400 cm/s AV VTI:            0.235 m AV Peak Grad:      5.2 mmHg AV Mean Grad:      3.0 mmHg LVOT Vmax:         89.40 cm/s LVOT Vmean:        57.700 cm/s LVOT VTI:          0.186 m LVOT/AV VTI ratio: 0.79  AORTA Ao Root diam: 3.70 cm Ao Asc diam:  3.30 cm MITRAL VALVE MV Area (PHT): 3.00 cm    SHUNTS MV Area VTI:   3.23 cm    Systemic VTI:  0.19 m MV Peak grad:  2.3 mmHg    Systemic Diam: 2.30 cm MV Mean grad:  1.0 mmHg MV Vmax:       0.75 m/s MV Vmean:      43.0 cm/s MV Decel Time: 253 msec MV E velocity: 65.60 cm/s MV A velocity: 61.70 cm/s MV E/A ratio:  1.06 [name] MD Electronically signed by [name] MD Signature Date/Time: 12/31/2020/11:23:06 AM    Final    CT Angio Chest/Abd/Pel for Dissection W and/or W/WO  Result Date: 12/30/2020 CLINICAL DATA:  Chest and abdominal pain for several hours, initial encounter EXAM: CT ANGIOGRAPHY CHEST, ABDOMEN AND PELVIS TECHNIQUE: Non-contrast CT of the chest was initially obtained.  Multidetector CT imaging through the chest, abdomen and pelvis was performed using the standard protocol during bolus administration of intravenous contrast. Multiplanar reconstructed images and MIPs were obtained and reviewed to evaluate the vascular anatomy. CONTRAST:  52mL OMNIPAQUE IOHEXOL 350 MG/ML SOLN COMPARISON:  CT from 10/24/2016, 05/29/2020 FINDINGS: CTA CHEST FINDINGS Cardiovascular: Precontrast images show no hyperdense crescent to suggest acute aortic injury. Post-contrast images show no aneurysmal dilatation or dissection. No cardiac enlargement is seen. No coronary calcifications are noted. Pulmonary artery as visualized is within normal limits although not timed for embolus evaluation. Mediastinum/Nodes: Thoracic inlet is within normal limits. No sizable hilar or mediastinal adenopathy is noted. The esophagus is within normal limits. Lungs/Pleura: Small ovoid nodular density is noted along the major fissure on the left best seen on image number 68 consistent with small subpleural lymph node. This is stable from the prior exam and consistent with a benign etiology. No focal infiltrate or sizable effusion is seen. No parenchymal nodules are noted. Musculoskeletal: Degenerative changes of the thoracic spine are noted. No acute rib abnormality is seen. Review of the MIP images confirms the above findings. CTA ABDOMEN AND PELVIS FINDINGS VASCULAR Aorta: Abdominal aorta shows no aneurysmal dilatation or dissection. Minimal atherosclerotic calcifications are seen. Celiac: Variant anatomy is noted with the common hepatic artery arising directly from the aorta. Left gastric and splenic artery are within normal limits. SMA: Patent without evidence of aneurysm, dissection, vasculitis or significant stenosis. Renals: Both renal arteries are patent without evidence of aneurysm, dissection, vasculitis, fibromuscular dysplasia or significant stenosis. IMA: Patent without evidence of aneurysm, dissection,  vasculitis or significant stenosis. Inflow: Iliacs show mild eccentric plaque in the proximal left common iliac  artery although no stenosis is seen. No other focal abnormality is noted. Veins: No specific venous abnormality is noted. Review of the MIP images confirms the above findings. NON-VASCULAR Hepatobiliary: Fatty infiltration of the liver is noted. Gallbladder is within normal limits. Pancreas: Unremarkable. No pancreatic ductal dilatation or surrounding inflammatory changes. Spleen: Normal in size without focal abnormality. Adrenals/Urinary Tract: Adrenal glands are within normal limits. Kidneys demonstrate a normal enhancement pattern bilaterally. No renal calculi or obstructive changes are seen. The ureters are within normal limits. Bladder is partially distended. Stomach/Bowel: Appendix is within normal limits. No obstructive or inflammatory changes of the colon seen. Small bowel and stomach are within normal limits. Lymphatic: No significant lymphadenopathy is noted. Reproductive: Prostate is unremarkable. Other: No abdominal wall hernia or abnormality. No abdominopelvic ascites. Musculoskeletal: No acute bony abnormality is noted. Degenerative changes of lumbar spine are seen. Review of the MIP images confirms the above findings. IMPRESSION: CTA of the chest: No evidence of aortic dissection or pulmonary embolism. Stable subpleural lymph nodes unchanged from 2018. No acute abnormality is noted. CTA of the abdomen and pelvis: Mild atherosclerotic changes are seen without acute dissection or aneurysmal dilatation. Fatty infiltration of the liver. No other focal abnormality is noted. Electronically Signed   By: Inez Catalina M.D.   On: 12/30/2020 20:19    Cardiac Studies   Cath: 12/31/20  CONCLUSIONS: High-grade stenosis in the large mid diagonal branch 99% reduced to 0% with TIMI-3 flow using a 2.0 x 12 Onyx frontier.  Chest discomfort was dissimilar to presenting complaint. Left main is normal LAD  has mild diffuse disease Circumflex contains 80 to 90% proximal stenosis.  The vessel is diffusely diseased and gives origin to 2-2 small obtuse marginal branches.  If symptoms continue, perhaps this proximal lesion should be stented. RCA has a "rosary bead "appearance.  It is a large vessel.  There is 60% mid, 60% distal, diffuse disease in the PDA and LV branches. Left ventriculography demonstrated mid anterior wall hypokinesis.  EF 50%.  LVEDP is normal.   RECOMMENDATIONS:   Aggressive risk factor modification. Smoking cessation. Monitor for recurrent symptoms and consider PCI of circumflex if symptoms do not abate with PCI of this moderate-sized diagonal branch  Diagnostic Dominance: Right Intervention    Echo: 12/31/20  IMPRESSIONS     1. Left ventricular ejection fraction, by estimation, is 65 to 70%. The  left ventricle has normal function. The left ventricle has no regional  wall motion abnormalities. There is mild left ventricular hypertrophy.  Left ventricular diastolic parameters  were normal.   2. Right ventricular systolic function is normal. The right ventricular  size is normal. Tricuspid regurgitation signal is inadequate for assessing  PA pressure.   3. The mitral valve is normal in structure. Trivial mitral valve  regurgitation. No evidence of mitral stenosis.   4. The aortic valve is tricuspid. Aortic valve regurgitation is not  visualized. No aortic stenosis is present.   5. The inferior vena cava is normal in size with greater than 50%  respiratory variability, suggesting right atrial pressure of 3 mmHg.   FINDINGS   Left Ventricle: Left ventricular ejection fraction, by estimation, is 65  to 70%. The left ventricle has normal function. The left ventricle has no  regional wall motion abnormalities. The left ventricular internal cavity  size was normal in size. There is   mild left ventricular hypertrophy. Left ventricular diastolic parameters  were  normal.   Right Ventricle: The right ventricular size  is normal. No increase in  right ventricular wall thickness. Right ventricular systolic function is  normal. Tricuspid regurgitation signal is inadequate for assessing PA  pressure.   Left Atrium: Left atrial size was normal in size.   Right Atrium: Right atrial size was normal in size.   Pericardium: There is no evidence of pericardial effusion.   Mitral Valve: The mitral valve is normal in structure. Trivial mitral  valve regurgitation. No evidence of mitral valve stenosis. MV peak  gradient, 2.3 mmHg. The mean mitral valve gradient is 1.0 mmHg.   Tricuspid Valve: The tricuspid valve is normal in structure. Tricuspid  valve regurgitation is not demonstrated. No evidence of tricuspid  stenosis.   Aortic Valve: The aortic valve is tricuspid. Aortic valve regurgitation is  not visualized. No aortic stenosis is present. Aortic valve mean gradient  measures 3.0 mmHg. Aortic valve peak gradient measures 5.2 mmHg. Aortic  valve area, by VTI measures 3.29  cm.   Pulmonic Valve: The pulmonic valve was not well visualized. Pulmonic valve  regurgitation is mild. No evidence of pulmonic stenosis.   Aorta: The aortic root is normal in size and structure.   Venous: The inferior vena cava is normal in size with greater than 50%  respiratory variability, suggesting right atrial pressure of 3 mmHg.   IAS/Shunts: No atrial level shunt detected by color flow Doppler.   Patient Profile     50 y.o. male with past medical history of hypertension, hyperlipidemia, diabetes, tobacco use and prior DVT who presented with chest pain and found to have NSTEMI.  Assessment & Plan    Non-STEMI: High-sensitivity troponin peaked at 687.  Underwent cardiac catheterization 12/6 with high-grade stenosis of large diagonal branch of 99% treated with PCI/DES x1.  Does have residual disease of 80 to 90% and circumflex.  Also 60% mid/distal disease of the PDA  and LV branches.  Placed on DAPT with aspirin/plavix post cath with recommendations for at least 1 year.  Did have residual chest pain overnight.  Discussed with patient regarding potential treatment of circumflex disease.  MD to review cath films.  -- Keep n.p.o. this morning with plans for intervention this afternoon --On aspirin/plavix, statin, beta-blocker  Hypertension: Blood pressures are borderline. --We will further titrate metoprolol to 25 mg twice daily --Favor transitioning from amlodipine to lisinopril given his history of diabetes (start lisinopril 40 mg daily tomorrow)  Hyperlipidemia: LDL 178 --Now on Crestor 40 mg daily  Diabetes: Hemoglobin A1c 7.4 --SSI while inpt -- Consider SGLT2  Tobacco use: Cessation  For questions or updates, please contact [place] Please consult [email or web address] for contact info under        Signed, Reino Bellis, NP  01/01/2021, 10:12 AM     Patient seen and examined. Agree with assessment and plan.  Patient has had recurrent chest pain this morning.  Cath data reviewed.  He underwent successful DES stenting to high-grade diagonal stenosis yesterday.  There is significant 85% proximal circumflex stenosis with diffuse distal circumflex disease and probable 60 to 70% mid RCA stenoses.  In light of recurrent anginal symptomatology, recommend PCI to the circumflex vessel today.  The patient has eaten breakfast this morning.  We will keep n.p.o. and placed on cath schedule for left circumflex intervention this afternoon.  Right radial cath site from yesterday is stable.  Patient is aware of risk benefits of the procedure.  LDL cholesterol 178, patient now on rosuvastatin 40 mg daily.  Smoking cessation is essential.  [name]  A. Claiborne Billings, MD, [facility] 01/01/2021 10:43 AM

## 2021-01-01 NOTE — Progress Notes (Incomplete)
Hello,  The Pharmacy team is conducting a discharge transitions of care quality improvement initiative. The recommendations below are for your consideration.    Jacob Baker is a 50 y.o. male (MRN: [id number], DOB: [birth year]) who was recently hospitalized on 12/30/2020 for NSTEMI. They are anticipated to visit your clinic for post-discharge follow-up and may benefit from assistance with medication initiation and/or access.     Relevant medication access issues which may benefit from further intervention include:   Please consider the following therapy recommendations at follow-up appointment below:     Other relevant medication issues from their recent admission include:   We appreciate your assistance with the implementation of these recommendations. Please let us know if there is anything we can help you with at this time.       Thank you,   Hildred Laser, PharmD Clinical Pharmacist **Pharmacist phone directory can now be found on [email or web address] ([email or web address]).  Listed under [place].

## 2021-01-01 NOTE — Progress Notes (Signed)
Inpatient Diabetes Program Recommendations  AACE/ADA: New Consensus Statement on Inpatient Glycemic Control (2015)  Target Ranges:  Prepandial:   less than 140 mg/dL      Peak postprandial:   less than 180 mg/dL (1-2 hours)      Critically ill patients:  140 - 180 mg/dL   Lab Results  Component Value Date   GLUCAP 216 (H) 01/01/2021   HGBA1C 7.4 (H) 12/31/2020    Review of Glycemic Control  Latest Reference Range & Units 12/31/20 11:24 12/31/20 16:44 12/31/20 19:32 01/01/21 07:25 01/01/21 11:52  Glucose-Capillary 70 - 99 mg/dL 211 (H) 177 (H) 284 (H) 267 (H) 216 (H)   Diabetes history: DM 2 Outpatient Diabetes medications:  Glucotrol XL 10 mg daily, Metformin 850 mg bid Current orders for Inpatient glycemic control:  Novolog moderate tid with meals and HS  Inpatient Diabetes Program Recommendations:    Consider adding Semglee 12 units daily while in the hospital and oral agents on hold.  Agree with addition of SGLT-2 at d/c (and possibly d/c Glucotrol).    Thanks,  Adah Perl, RN, BC-ADM Inpatient Diabetes Coordinator Pager [phone]  (8a-5p)

## 2021-01-01 NOTE — Research (Signed)
ID- [id number],  SOS-[facility]I  Authorized Site Personnel  [x]   Shirley Muscat, RN     []   Jasmine Pang, RN  []   Philemon Kingdom, RN  []   Amy Enid Derry, RCIS  []   Berneda Rose, RN   SUBJECT NAME: ___[name]_ MRN: _[id number]________   Date of study introduction: _07-DEC-2022    Time of introduction: __0930___  (24 hour clock)  During the subject's hospital visit , the authorized site personnel discussed with  with the subject the possibility to participate in the [facility] study, and provided  the subject with the approved Subject Information Leaflet (SIL)-Informed Consent form (ICF) in a language that he/she can understand.  The subject took the document home to reflect on his/her participation in this study before signing it.   [x]   The Subject will return to the Cardiovascular Research office, at a later day, to            complete the consenting process.  OR  []   The Subject will be given ample time to reflect on this study but will be completing            consent process prior to his/ her discharge from this admission.   **Form based on [employer] [id number]

## 2021-01-01 NOTE — Progress Notes (Addendum)
Progress Note  Patient Name: Jacob Baker Date of Encounter: 01/01/2021  [place] Cardiologist: None   Subjective   Intermittent chest pain overnight into this morning. Rates 4/10.   Inpatient Medications    Scheduled Meds:  amLODipine  10 mg Oral Daily   aspirin EC  81 mg Oral Daily   clopidogrel  75 mg Oral Q breakfast   insulin aspart  0-15 Units Subcutaneous TID WC   insulin aspart  0-5 Units Subcutaneous QHS   metoprolol tartrate  12.5 mg Oral BID   pantoprazole  40 mg Oral Daily   rosuvastatin  40 mg Oral QHS   sodium chloride flush  3 mL Intravenous Q12H   Continuous Infusions:  sodium chloride 250 mL (12/31/20 2320)   PRN Meds: sodium chloride, acetaminophen, ondansetron (ZOFRAN) IV, sodium chloride flush   Vital Signs    Vitals:   12/31/20 1745 12/31/20 1750 12/31/20 1814 01/01/21 0629  BP: (!) 153/94 (!) 163/96 (!) 155/91 131/87  Pulse: 71 69 67 70  Resp: 15 20  18   Temp:   97.9 F (36.6 C) 98.2 F (36.8 C)  TempSrc:   Oral Oral  SpO2: 100% 100% 100% 98%  Weight:      Height:        Intake/Output Summary (Last 24 hours) at 01/01/2021 1012 Last data filed at 12/31/2020 2320 Gross per 24 hour  Intake 539.58 ml  Output --  Net 539.58 ml   Last 3 Weights 12/30/2020 12/30/2020 12/24/2020  Weight (lbs) 195 lb 12.3 oz 195 lb 195 lb  Weight (kg) 88.8 kg 88.451 kg 88.451 kg      Telemetry    SR, PVC - Personally Reviewed  ECG    SR, 64 bpm - Personally Reviewed  Physical Exam   GEN: No acute distress.   Neck: No JVD Cardiac: RRR, no murmurs, rubs, or gallops.  Respiratory: Clear to auscultation bilaterally. GI: Soft, nontender, non-distended  MS: No edema; No deformity. Right radial cath site stable.  Neuro:  Nonfocal  Psych: Normal affect   Labs    High Sensitivity Troponin:   Recent Labs  Lab 12/30/20 1804 12/30/20 2018 12/31/20 0603 12/31/20 0745  TROPONINIHS 607* 687* 415* 408*     Chemistry Recent Labs  Lab  12/30/20 1804 12/31/20 0603 01/01/21 0406  NA 135 137 136  K 3.5 3.5 3.6  CL 104 105 103  CO2 24 24 25   GLUCOSE 171* 254* 236*  BUN 17 17 13   CREATININE 0.85 0.86 1.03  CALCIUM 8.8* 8.7* 8.7*  MG  --  2.1  --   PROT  --  6.0*  --   ALBUMIN  --  3.8  --   AST  --  10*  --   ALT  --  12  --   ALKPHOS  --  88  --   BILITOT  --  0.5  --   GFRNONAA >60 >60 >60  ANIONGAP 7 8 8     Lipids  Recent Labs  Lab 01/01/21 0406  CHOL 248*  TRIG 202*  HDL 30*  LDLCALC 178*  CHOLHDL 8.3    Hematology Recent Labs  Lab 12/30/20 1804 12/31/20 0603 01/01/21 0406  WBC 13.2* 9.7 9.9  RBC 4.90 4.64 4.60  HGB 14.4 13.2 13.0  HCT 39.5 37.7* 36.3*  MCV 80.6 81.3 78.9*  MCH 29.4 28.4 28.3  MCHC 36.5* 35.0 35.8  RDW 13.0 12.9 12.9  PLT 313 267 239   Thyroid  No results for input(s): TSH, FREET4 in the last 168 hours.  BNPNo results for input(s): BNP, PROBNP in the last 168 hours.  DDimer No results for input(s): DDIMER in the last 168 hours.   Radiology    DG Chest 2 View  Result Date: 12/30/2020 CLINICAL DATA:  Chest pain. EXAM: CHEST - 2 VIEW COMPARISON:  Chest radiograph dated 06/11/2020. FINDINGS: The heart size and mediastinal contours are within normal limits. Both lungs are clear. The visualized skeletal structures are unremarkable. IMPRESSION: No active cardiopulmonary disease. Electronically Signed   By: Anner Crete M.D.   On: 12/30/2020 18:36   CARDIAC CATHETERIZATION  Result Date: 12/31/2020 CONCLUSIONS: High-grade stenosis in the large mid diagonal branch 99% reduced to 0% with TIMI-3 flow using a 2.0 x 12 Onyx frontier.  Chest discomfort was dissimilar to presenting complaint. Left main is normal LAD has mild diffuse disease Circumflex contains 80 to 90% proximal stenosis.  The vessel is diffusely diseased and gives origin to 2-2 small obtuse marginal branches.  If symptoms continue, perhaps this proximal lesion should be stented. RCA has a "rosary bead "appearance.  It  is a large vessel.  There is 60% mid, 60% distal, diffuse disease in the PDA and LV branches. Left ventriculography demonstrated mid anterior wall hypokinesis.  EF 50%.  LVEDP is normal. RECOMMENDATIONS: Aggressive risk factor modification. Smoking cessation. Monitor for recurrent symptoms and consider PCI of circumflex if symptoms do not abate with PCI of this moderate-sized diagonal branch  ECHOCARDIOGRAM COMPLETE  Result Date: 12/31/2020    ECHOCARDIOGRAM REPORT   Patient Name:   [name] Date of Exam: 12/31/2020 Medical Rec #:  [id number]      Height:       73.0 in Accession #:    [id number]     Weight:       195.8 lb Date of Birth:  1970-05-08      BSA:          2.132 m Patient Age:    86 years       BP:           141/88 mmHg Patient Gender: M              HR:           75 bpm. Exam Location:  [facility] Procedure: 2D Echo, Cardiac Doppler and Color Doppler Indications:    NSTEMI  History:        Patient has no prior history of Echocardiogram examinations.                 Signs/Symptoms:Chest Pain; Risk Factors:Hypertension, Diabetes,                 Dyslipidemia and Current Smoker.  Sonographer:    Wenda Low Referring Phys: [id number] [place]  1. Left ventricular ejection fraction, by estimation, is 65 to 70%. The left ventricle has normal function. The left ventricle has no regional wall motion abnormalities. There is mild left ventricular hypertrophy. Left ventricular diastolic parameters were normal.  2. Right ventricular systolic function is normal. The right ventricular size is normal. Tricuspid regurgitation signal is inadequate for assessing PA pressure.  3. The mitral valve is normal in structure. Trivial mitral valve regurgitation. No evidence of mitral stenosis.  4. The aortic valve is tricuspid. Aortic valve regurgitation is not visualized. No aortic stenosis is present.  5. The inferior vena cava is normal in size with greater than 50% respiratory variability,  suggesting right atrial pressure of 3 mmHg. FINDINGS  Left Ventricle: Left ventricular ejection fraction, by estimation, is 65 to 70%. The left ventricle has normal function. The left ventricle has no regional wall motion abnormalities. The left ventricular internal cavity size was normal in size. There is  mild left ventricular hypertrophy. Left ventricular diastolic parameters were normal. Right Ventricle: The right ventricular size is normal. No increase in right ventricular wall thickness. Right ventricular systolic function is normal. Tricuspid regurgitation signal is inadequate for assessing PA pressure. Left Atrium: Left atrial size was normal in size. Right Atrium: Right atrial size was normal in size. Pericardium: There is no evidence of pericardial effusion. Mitral Valve: The mitral valve is normal in structure. Trivial mitral valve regurgitation. No evidence of mitral valve stenosis. MV peak gradient, 2.3 mmHg. The mean mitral valve gradient is 1.0 mmHg. Tricuspid Valve: The tricuspid valve is normal in structure. Tricuspid valve regurgitation is not demonstrated. No evidence of tricuspid stenosis. Aortic Valve: The aortic valve is tricuspid. Aortic valve regurgitation is not visualized. No aortic stenosis is present. Aortic valve mean gradient measures 3.0 mmHg. Aortic valve peak gradient measures 5.2 mmHg. Aortic valve area, by VTI measures 3.29 cm. Pulmonic Valve: The pulmonic valve was not well visualized. Pulmonic valve regurgitation is mild. No evidence of pulmonic stenosis. Aorta: The aortic root is normal in size and structure. Venous: The inferior vena cava is normal in size with greater than 50% respiratory variability, suggesting right atrial pressure of 3 mmHg. IAS/Shunts: No atrial level shunt detected by color flow Doppler.  LEFT VENTRICLE PLAX 2D LVIDd:         4.60 cm     Diastology LVIDs:         2.60 cm     LV e' medial:    5.35 cm/s LV PW:         1.10 cm     LV E/e' medial:  12.3 LV  IVS:        1.10 cm     LV e' lateral:   11.80 cm/s LVOT diam:     2.30 cm     LV E/e' lateral: 5.6 LV SV:         77 LV SV Index:   36 LVOT Area:     4.15 cm  LV Volumes (MOD) LV vol d, MOD A2C: 96.8 ml LV vol d, MOD A4C: 62.1 ml LV vol s, MOD A2C: 43.7 ml LV vol s, MOD A4C: 26.9 ml LV SV MOD A2C:     53.1 ml LV SV MOD A4C:     62.1 ml LV SV MOD BP:      43.8 ml RIGHT VENTRICLE RV Basal diam:  3.40 cm RV Mid diam:    3.40 cm RV S prime:     14.80 cm/s TAPSE (M-mode): 2.7 cm LEFT ATRIUM             Index        RIGHT ATRIUM           Index LA diam:        3.50 cm 1.64 cm/m   RA Area:     22.10 cm LA Vol (A2C):   72.4 ml 33.96 ml/m  RA Volume:   68.70 ml  32.22 ml/m LA Vol (A4C):   53.5 ml 25.09 ml/m LA Biplane Vol: 64.9 ml 30.44 ml/m  AORTIC VALVE  PULMONIC VALVE AV Area (Vmax):    3.26 cm     PV Vmax:       0.73 m/s AV Area (Vmean):   3.02 cm     PV Peak grad:  2.1 mmHg AV Area (VTI):     3.29 cm AV Vmax:           114.00 cm/s AV Vmean:          79.400 cm/s AV VTI:            0.235 m AV Peak Grad:      5.2 mmHg AV Mean Grad:      3.0 mmHg LVOT Vmax:         89.40 cm/s LVOT Vmean:        57.700 cm/s LVOT VTI:          0.186 m LVOT/AV VTI ratio: 0.79  AORTA Ao Root diam: 3.70 cm Ao Asc diam:  3.30 cm MITRAL VALVE MV Area (PHT): 3.00 cm    SHUNTS MV Area VTI:   3.23 cm    Systemic VTI:  0.19 m MV Peak grad:  2.3 mmHg    Systemic Diam: 2.30 cm MV Mean grad:  1.0 mmHg MV Vmax:       0.75 m/s MV Vmean:      43.0 cm/s MV Decel Time: 253 msec MV E velocity: 65.60 cm/s MV A velocity: 61.70 cm/s MV E/A ratio:  1.06 [name] MD Electronically signed by [name] MD Signature Date/Time: 12/31/2020/11:23:06 AM    Final    CT Angio Chest/Abd/Pel for Dissection W and/or W/WO  Result Date: 12/30/2020 CLINICAL DATA:  Chest and abdominal pain for several hours, initial encounter EXAM: CT ANGIOGRAPHY CHEST, ABDOMEN AND PELVIS TECHNIQUE: Non-contrast CT of the chest was initially obtained.  Multidetector CT imaging through the chest, abdomen and pelvis was performed using the standard protocol during bolus administration of intravenous contrast. Multiplanar reconstructed images and MIPs were obtained and reviewed to evaluate the vascular anatomy. CONTRAST:  25mL OMNIPAQUE IOHEXOL 350 MG/ML SOLN COMPARISON:  CT from 10/24/2016, 05/29/2020 FINDINGS: CTA CHEST FINDINGS Cardiovascular: Precontrast images show no hyperdense crescent to suggest acute aortic injury. Post-contrast images show no aneurysmal dilatation or dissection. No cardiac enlargement is seen. No coronary calcifications are noted. Pulmonary artery as visualized is within normal limits although not timed for embolus evaluation. Mediastinum/Nodes: Thoracic inlet is within normal limits. No sizable hilar or mediastinal adenopathy is noted. The esophagus is within normal limits. Lungs/Pleura: Small ovoid nodular density is noted along the major fissure on the left best seen on image number 68 consistent with small subpleural lymph node. This is stable from the prior exam and consistent with a benign etiology. No focal infiltrate or sizable effusion is seen. No parenchymal nodules are noted. Musculoskeletal: Degenerative changes of the thoracic spine are noted. No acute rib abnormality is seen. Review of the MIP images confirms the above findings. CTA ABDOMEN AND PELVIS FINDINGS VASCULAR Aorta: Abdominal aorta shows no aneurysmal dilatation or dissection. Minimal atherosclerotic calcifications are seen. Celiac: Variant anatomy is noted with the common hepatic artery arising directly from the aorta. Left gastric and splenic artery are within normal limits. SMA: Patent without evidence of aneurysm, dissection, vasculitis or significant stenosis. Renals: Both renal arteries are patent without evidence of aneurysm, dissection, vasculitis, fibromuscular dysplasia or significant stenosis. IMA: Patent without evidence of aneurysm, dissection,  vasculitis or significant stenosis. Inflow: Iliacs show mild eccentric plaque in the proximal left common iliac  artery although no stenosis is seen. No other focal abnormality is noted. Veins: No specific venous abnormality is noted. Review of the MIP images confirms the above findings. NON-VASCULAR Hepatobiliary: Fatty infiltration of the liver is noted. Gallbladder is within normal limits. Pancreas: Unremarkable. No pancreatic ductal dilatation or surrounding inflammatory changes. Spleen: Normal in size without focal abnormality. Adrenals/Urinary Tract: Adrenal glands are within normal limits. Kidneys demonstrate a normal enhancement pattern bilaterally. No renal calculi or obstructive changes are seen. The ureters are within normal limits. Bladder is partially distended. Stomach/Bowel: Appendix is within normal limits. No obstructive or inflammatory changes of the colon seen. Small bowel and stomach are within normal limits. Lymphatic: No significant lymphadenopathy is noted. Reproductive: Prostate is unremarkable. Other: No abdominal wall hernia or abnormality. No abdominopelvic ascites. Musculoskeletal: No acute bony abnormality is noted. Degenerative changes of lumbar spine are seen. Review of the MIP images confirms the above findings. IMPRESSION: CTA of the chest: No evidence of aortic dissection or pulmonary embolism. Stable subpleural lymph nodes unchanged from 2018. No acute abnormality is noted. CTA of the abdomen and pelvis: Mild atherosclerotic changes are seen without acute dissection or aneurysmal dilatation. Fatty infiltration of the liver. No other focal abnormality is noted. Electronically Signed   By: Inez Catalina M.D.   On: 12/30/2020 20:19    Cardiac Studies   Cath: 12/31/20  CONCLUSIONS: High-grade stenosis in the large mid diagonal branch 99% reduced to 0% with TIMI-3 flow using a 2.0 x 12 Onyx frontier.  Chest discomfort was dissimilar to presenting complaint. Left main is normal LAD  has mild diffuse disease Circumflex contains 80 to 90% proximal stenosis.  The vessel is diffusely diseased and gives origin to 2-2 small obtuse marginal branches.  If symptoms continue, perhaps this proximal lesion should be stented. RCA has a "rosary bead "appearance.  It is a large vessel.  There is 60% mid, 60% distal, diffuse disease in the PDA and LV branches. Left ventriculography demonstrated mid anterior wall hypokinesis.  EF 50%.  LVEDP is normal.   RECOMMENDATIONS:   Aggressive risk factor modification. Smoking cessation. Monitor for recurrent symptoms and consider PCI of circumflex if symptoms do not abate with PCI of this moderate-sized diagonal branch  Diagnostic Dominance: Right Intervention    Echo: 12/31/20  IMPRESSIONS     1. Left ventricular ejection fraction, by estimation, is 65 to 70%. The  left ventricle has normal function. The left ventricle has no regional  wall motion abnormalities. There is mild left ventricular hypertrophy.  Left ventricular diastolic parameters  were normal.   2. Right ventricular systolic function is normal. The right ventricular  size is normal. Tricuspid regurgitation signal is inadequate for assessing  PA pressure.   3. The mitral valve is normal in structure. Trivial mitral valve  regurgitation. No evidence of mitral stenosis.   4. The aortic valve is tricuspid. Aortic valve regurgitation is not  visualized. No aortic stenosis is present.   5. The inferior vena cava is normal in size with greater than 50%  respiratory variability, suggesting right atrial pressure of 3 mmHg.   FINDINGS   Left Ventricle: Left ventricular ejection fraction, by estimation, is 65  to 70%. The left ventricle has normal function. The left ventricle has no  regional wall motion abnormalities. The left ventricular internal cavity  size was normal in size. There is   mild left ventricular hypertrophy. Left ventricular diastolic parameters  were  normal.   Right Ventricle: The right ventricular size  is normal. No increase in  right ventricular wall thickness. Right ventricular systolic function is  normal. Tricuspid regurgitation signal is inadequate for assessing PA  pressure.   Left Atrium: Left atrial size was normal in size.   Right Atrium: Right atrial size was normal in size.   Pericardium: There is no evidence of pericardial effusion.   Mitral Valve: The mitral valve is normal in structure. Trivial mitral  valve regurgitation. No evidence of mitral valve stenosis. MV peak  gradient, 2.3 mmHg. The mean mitral valve gradient is 1.0 mmHg.   Tricuspid Valve: The tricuspid valve is normal in structure. Tricuspid  valve regurgitation is not demonstrated. No evidence of tricuspid  stenosis.   Aortic Valve: The aortic valve is tricuspid. Aortic valve regurgitation is  not visualized. No aortic stenosis is present. Aortic valve mean gradient  measures 3.0 mmHg. Aortic valve peak gradient measures 5.2 mmHg. Aortic  valve area, by VTI measures 3.29  cm.   Pulmonic Valve: The pulmonic valve was not well visualized. Pulmonic valve  regurgitation is mild. No evidence of pulmonic stenosis.   Aorta: The aortic root is normal in size and structure.   Venous: The inferior vena cava is normal in size with greater than 50%  respiratory variability, suggesting right atrial pressure of 3 mmHg.   IAS/Shunts: No atrial level shunt detected by color flow Doppler.   Patient Profile     50 y.o. male with past medical history of hypertension, hyperlipidemia, diabetes, tobacco use and prior DVT who presented with chest pain and found to have NSTEMI.  Assessment & Plan    Non-STEMI: High-sensitivity troponin peaked at 687.  Underwent cardiac catheterization 12/6 with high-grade stenosis of large diagonal branch of 99% treated with PCI/DES x1.  Does have residual disease of 80 to 90% and circumflex.  Also 60% mid/distal disease of the PDA  and LV branches.  Placed on DAPT with aspirin/plavix post cath with recommendations for at least 1 year.  Did have residual chest pain overnight.  Discussed with patient regarding potential treatment of circumflex disease.  MD to review cath films.  -- Keep n.p.o. this morning with plans for intervention this afternoon --On aspirin/plavix, statin, beta-blocker  Hypertension: Blood pressures are borderline. --We will further titrate metoprolol to 25 mg twice daily --Favor transitioning from amlodipine to lisinopril given his history of diabetes (start lisinopril 40 mg daily tomorrow)  Hyperlipidemia: LDL 178 --Now on Crestor 40 mg daily  Diabetes: Hemoglobin A1c 7.4 --SSI while inpt -- Consider SGLT2  Tobacco use: Cessation  For questions or updates, please contact [place] Please consult [email or web address] for contact info under        Signed, Reino Bellis, NP  01/01/2021, 10:12 AM     Patient seen and examined. Agree with assessment and plan.  Patient has had recurrent chest pain this morning.  Cath data reviewed.  He underwent successful DES stenting to high-grade diagonal stenosis yesterday.  There is significant 85% proximal circumflex stenosis with diffuse distal circumflex disease and probable 60 to 70% mid RCA stenoses.  In light of recurrent anginal symptomatology, recommend PCI to the circumflex vessel today.  The patient has eaten breakfast this morning.  We will keep n.p.o. and placed on cath schedule for left circumflex intervention this afternoon.  Right radial cath site from yesterday is stable.  Patient is aware of risk benefits of the procedure.  LDL cholesterol 178, patient now on rosuvastatin 40 mg daily.  Smoking cessation is essential.  [name]  A. Claiborne Billings, MD, [facility] 01/01/2021 10:43 AM

## 2021-01-01 NOTE — Interval H&P Note (Signed)
Cath Lab Visit (complete for each Cath Lab visit)  Clinical Evaluation Leading to the Procedure:   ACS: Yes.    Non-ACS:    Anginal Classification: CCS IV  Anti-ischemic medical therapy: Minimal Therapy (1 class of medications)  Non-Invasive Test Results: No non-invasive testing performed  Prior CABG: No previous CABG      History and Physical Interval Note:  01/01/2021 2:44 PM  Jacob Baker  has presented today for surgery, with the diagnosis of CAD.  The various methods of treatment have been discussed with the patient and family. After consideration of risks, benefits and other options for treatment, the patient has consented to  Procedure(s): CORONARY STENT INTERVENTION (N/A) as a surgical intervention.  The patient's history has been reviewed, patient examined, no change in status, stable for surgery.  I have reviewed the patient's chart and labs.  Questions were answered to the patient's satisfaction.     Larae Grooms

## 2021-01-01 NOTE — TOC Benefit Eligibility Note (Signed)
Patient Teacher, English as a foreign language completed.    The patient is currently admitted and upon discharge could be taking Farxiga 10 mg.  The current 30 day co-pay is, $136.91.   The patient is currently admitted and upon discharge could be taking Jardiance 10 mg.  The current 30 day co-pay is, $272.23.   The patient is insured through [employer] [place], [place] Patient Advocate Specialist [place] Patient Advocate Team Direct Number: [phone]  Fax: [phone]

## 2021-01-02 ENCOUNTER — Other Ambulatory Visit (HOSPITAL_COMMUNITY): Payer: Self-pay

## 2021-01-02 ENCOUNTER — Encounter (HOSPITAL_COMMUNITY): Payer: Self-pay | Admitting: Interventional Cardiology

## 2021-01-02 ENCOUNTER — Telehealth (HOSPITAL_COMMUNITY): Payer: Self-pay | Admitting: Pharmacist

## 2021-01-02 DIAGNOSIS — E1165 Type 2 diabetes mellitus with hyperglycemia: Secondary | ICD-10-CM

## 2021-01-02 DIAGNOSIS — Z955 Presence of coronary angioplasty implant and graft: Secondary | ICD-10-CM

## 2021-01-02 DIAGNOSIS — I10 Essential (primary) hypertension: Secondary | ICD-10-CM

## 2021-01-02 DIAGNOSIS — F17200 Nicotine dependence, unspecified, uncomplicated: Secondary | ICD-10-CM

## 2021-01-02 LAB — CBC
HCT: 35.1 % — ABNORMAL LOW (ref 39.0–52.0)
Hemoglobin: 12.2 g/dL — ABNORMAL LOW (ref 13.0–17.0)
MCH: 28.1 pg (ref 26.0–34.0)
MCHC: 34.8 g/dL (ref 30.0–36.0)
MCV: 80.9 fL (ref 80.0–100.0)
Platelets: 243 10*3/uL (ref 150–400)
RBC: 4.34 MIL/uL (ref 4.22–5.81)
RDW: 12.8 % (ref 11.5–15.5)
WBC: 10.5 10*3/uL (ref 4.0–10.5)
nRBC: 0 % (ref 0.0–0.2)

## 2021-01-02 LAB — BASIC METABOLIC PANEL
Anion gap: 7 (ref 5–15)
BUN: 14 mg/dL (ref 6–20)
CO2: 24 mmol/L (ref 22–32)
Calcium: 8.9 mg/dL (ref 8.9–10.3)
Chloride: 104 mmol/L (ref 98–111)
Creatinine, Ser: 0.83 mg/dL (ref 0.61–1.24)
GFR, Estimated: >60 mL/min (ref >60)
Glucose, Bld: 168 mg/dL — ABNORMAL HIGH (ref 70–99)
Potassium: 3.5 mmol/L (ref 3.5–5.1)
Sodium: 135 mmol/L (ref 135–145)

## 2021-01-02 LAB — GLUCOSE, CAPILLARY
Glucose-Capillary: 184 mg/dL — ABNORMAL HIGH (ref 70–99)
Glucose-Capillary: 240 mg/dL — ABNORMAL HIGH (ref 70–99)

## 2021-01-02 MED ORDER — LISINOPRIL 20 MG PO TABS
20.0000 mg | ORAL_TABLET | Freq: Every day | ORAL | 11 refills | Status: DC
Start: 2021-01-02 — End: 2021-01-13
  Filled 2021-01-02: qty 30, 30d supply, fill #0

## 2021-01-02 MED ORDER — METOPROLOL TARTRATE 25 MG PO TABS
12.5000 mg | ORAL_TABLET | Freq: Two times a day (BID) | ORAL | 11 refills | Status: DC
  Filled 2021-01-02: qty 30, 30d supply, fill #0

## 2021-01-02 MED ORDER — TICAGRELOR 90 MG PO TABS
90.0000 mg | ORAL_TABLET | Freq: Two times a day (BID) | ORAL | 11 refills | Status: DC
  Filled 2021-01-02: qty 60, 30d supply, fill #0

## 2021-01-02 MED ORDER — DAPAGLIFLOZIN PROPANEDIOL 10 MG PO TABS
10.0000 mg | ORAL_TABLET | Freq: Every day | ORAL | Status: DC
  Administered 2021-01-02: 10 mg via ORAL
  Filled 2021-01-02: qty 1

## 2021-01-02 MED ORDER — LISINOPRIL 20 MG PO TABS
20.0000 mg | ORAL_TABLET | Freq: Every day | ORAL | Status: DC
  Administered 2021-01-02: 20 mg via ORAL
  Filled 2021-01-02: qty 1

## 2021-01-02 MED ORDER — METFORMIN HCL 850 MG PO TABS: ORAL_TABLET | ORAL | 1 refills | Status: DC

## 2021-01-02 MED ORDER — DAPAGLIFLOZIN PROPANEDIOL 10 MG PO TABS
10.0000 mg | ORAL_TABLET | Freq: Every day | ORAL | 11 refills | Status: DC
  Filled 2021-01-02: qty 30, 30d supply, fill #0

## 2021-01-02 MED ORDER — AMLODIPINE BESYLATE 5 MG PO TABS
5.0000 mg | ORAL_TABLET | Freq: Every day | ORAL | Status: DC
  Administered 2021-01-02: 5 mg via ORAL
  Filled 2021-01-02 (×2): qty 1

## 2021-01-02 MED ORDER — TICAGRELOR 90 MG PO TABS: 90.0000 mg | ORAL_TABLET | Freq: Two times a day (BID) | ORAL | Status: DC

## 2021-01-02 MED ORDER — METOPROLOL TARTRATE 25 MG PO TABS
25.0000 mg | ORAL_TABLET | Freq: Two times a day (BID) | ORAL | Status: DC
  Administered 2021-01-02: 25 mg via ORAL
  Filled 2021-01-02: qty 1

## 2021-01-02 MED ORDER — AMLODIPINE BESYLATE 5 MG PO TABS
5.0000 mg | ORAL_TABLET | Freq: Every day | ORAL | 11 refills | Status: DC
Start: 2021-01-02 — End: 2021-01-13
  Filled 2021-01-02: qty 30, 30d supply, fill #0

## 2021-01-02 MED ORDER — TICAGRELOR 90 MG PO TABS
180.0000 mg | ORAL_TABLET | Freq: Once | ORAL | Status: AC
  Administered 2021-01-02: 180 mg via ORAL
  Filled 2021-01-02: qty 2

## 2021-01-02 MED ORDER — ASPIRIN 81 MG PO TBEC
81.0000 mg | DELAYED_RELEASE_TABLET | Freq: Every day | ORAL | 3 refills | Status: DC
  Filled 2021-01-02: qty 30, 30d supply, fill #0

## 2021-01-02 MED ORDER — METOPROLOL TARTRATE 25 MG PO TABS
25.0000 mg | ORAL_TABLET | Freq: Two times a day (BID) | ORAL | 11 refills | Status: DC
  Filled 2021-01-02: qty 60, 30d supply, fill #0

## 2021-01-02 MED ORDER — NITROGLYCERIN 0.4 MG SL SUBL
0.4000 mg | SUBLINGUAL_TABLET | SUBLINGUAL | 1 refills | Status: DC | PRN
  Filled 2021-01-02: qty 25, 8d supply, fill #0

## 2021-01-02 MED ORDER — LISINOPRIL 40 MG PO TABS
40.0000 mg | ORAL_TABLET | Freq: Every day | ORAL | 11 refills | Status: DC
  Filled 2021-01-02: qty 30, 30d supply, fill #0

## 2021-01-02 MED ORDER — NICOTINE 21 MG/24HR TD PT24
21.0000 mg | MEDICATED_PATCH | TRANSDERMAL | 2 refills | Status: DC
  Filled 2021-01-02: qty 28, 28d supply, fill #0

## 2021-01-02 MED ORDER — ROSUVASTATIN CALCIUM 40 MG PO TABS
40.0000 mg | ORAL_TABLET | Freq: Every day | ORAL | 3 refills | Status: DC
  Filled 2021-01-02: qty 30, 30d supply, fill #0

## 2021-01-02 NOTE — Progress Notes (Addendum)
CARDIAC REHAB PHASE I   PRE:  Rate/Rhythm: 70 SR  BP:  Sitting: 141/94      SaO2: 98 RA  MODE:  Ambulation: 470 ft   POST:  Rate/Rhythm: 87 SR  BP:  Sitting: 154/91    SaO2: 99 RA  Pt ambulated 443ft in hallway independently with steady gait. Pt denies CP, SOB, or dizziness. Reinforced importance of ASA, Brilinta, statin, and NTG. Questions/concerns addressed. Pt requesting note for work, NP aware. Referred to [facility] [place].  [id number] Jacob Falco, RN BSN 01/02/2021 9:47 AM

## 2021-01-02 NOTE — Telephone Encounter (Signed)
Hello,  The Pharmacy team is conducting a discharge transitions of care quality improvement initiative. The recommendations below are for your consideration.    Jacob Baker is a 50 y.o. male (MRN: [id number], DOB: [birth year]) who was recently hospitalized on 12/30/2020 for NSTEMI. They are anticipated to visit your clinic for post-discharge follow-up and may benefit from assistance with medication initiation and/or access.     Relevant medication access issues which may benefit from further intervention include: -Brilinta cost with insurance is ~ $200/month but he can use the copay card which was provided at discharge -The Jardiance cost is ~$270/month but he can use the copay card which was provided at discharge  Please consider the following therapy recommendations at follow-up appointment below:   -He stated an interest in smoking cessation and was provided nicotine patches at discharge. Follow up with his success -Lisinopril and metoprolol were added and his PTA norvasc was decreased. Watch his BP, norvasc may need to be discontinued  We appreciate your assistance with the implementation of these recommendations. Please let us know if there is anything we can help you with at this time.        Thank you, Hildred Laser, PharmD Clinical Pharmacist **Pharmacist phone directory can now be found on [email or web address] ([email or web address]).  Listed under [place].

## 2021-01-02 NOTE — TOC Benefit Eligibility Note (Signed)
Patient Teacher, English as a foreign language completed.    The patient is currently admitted and upon discharge could be taking Brilinta 90 mg.  The current 30 day co-pay is, $203.02.   The patient is insured through [place], [place] Patient Advocate Specialist [place] Patient Advocate Team Direct Number: [phone]  Fax: [phone]

## 2021-01-02 NOTE — Progress Notes (Signed)
TR BAND REMOVAL  LOCATION:    right radial  DEFLATED PER PROTOCOL:    Yes.    TIME BAND OFF / DRESSING APPLIED:    2100   SITE UPON ARRIVAL:    Level 0  SITE AFTER BAND REMOVAL:    Level 0  CIRCULATION SENSATION AND MOVEMENT:    Within Normal Limits   Yes.    COMMENTS:   Pt.tolerated procedure well

## 2021-01-02 NOTE — Discharge Summary (Addendum)
Discharge Summary    Patient ID: Jacob Baker MRN: [id number]; DOB: 02-26-1970  Admit date: 12/30/2020 Discharge date: 01/02/2021  PCP:  [name], MD   [facility] Providers Cardiologist:  [name], MD     Discharge Diagnoses    Principal Problem:   NSTEMI (non-ST elevated myocardial infarction) [facility]) Active Problems:   Current smoker   Hyperlipidemia associated with type 2 diabetes mellitus ([place])   Pituitary insufficiency ([place])   Chest pain   Essential hypertension   Uncontrolled type 2 diabetes mellitus with hyperglycemia ([place])   Mixed hyperlipidemia   Leukocytosis   Hyperglycemia due to diabetes mellitus (HCC)   Elevated troponin   GERD (gastroesophageal reflux disease)   Status post coronary artery stent placement  Diagnostic Studies/Procedures    Cath: 12/31/20  CONCLUSIONS: High-grade stenosis in the large mid diagonal branch 99% reduced to 0% with TIMI-3 flow using a 2.0 x 12 Onyx frontier.  Chest discomfort was dissimilar to presenting complaint. Left main is normal LAD has mild diffuse disease Circumflex contains 80 to 90% proximal stenosis.  The vessel is diffusely diseased and gives origin to 2-2 small obtuse marginal branches.  If symptoms continue, perhaps this proximal lesion should be stented. RCA has a "rosary bead "appearance.  It is a large vessel.  There is 60% mid, 60% distal, diffuse disease in the PDA and LV branches. Left ventriculography demonstrated mid anterior wall hypokinesis.  EF 50%.  LVEDP is normal.   RECOMMENDATIONS:   Aggressive risk factor modification. Smoking cessation. Monitor for recurrent symptoms and consider PCI of circumflex if symptoms do not abate with PCI of this moderate-sized diagonal branch   Diagnostic Dominance: Right Intervention    Cath: 01/01/21    Prox Cx lesion is 85% stenosed.  This was a small vessel that was diffusely diseased.   Prox RCA to Mid RCA lesion is 80% stenosed.   A  drug-eluting stent was successfully placed using a STENT ONYX FRONTIER 4.0X18, posdilated to 4.5 mm.   Post intervention, there is a 0% residual stenosis.   Dist RCA lesion is 50% stenosed.   Diagonal stent was widely patent.   LV end diastolic pressure is normal.   There is no aortic valve stenosis.   Successful PCI of RCA.  He needs aggressive secondary prevention, smoking cessation.  Would consider changing Plavix to Brilinta since he had ACS.   Diagnostic Dominance: Right Intervention   _____________   History of Present Illness     Jacob Baker is a 50 y.o. male with PMH of HTN, HLD, Type 2 DM, tobacco use and prior DVT who presented to [facility] with chest pain and found to have a NSTEMI.   [name] presented to [facility] ED on 12/30/2020 for evaluation of chest pain.  In talking with the patient and his wife, he reported having intermittent chest pain for the past week which initially started occurring with activity but then started to occur at rest as well. 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 No nausea, vomiting or diaphoresis. Over the past few nights, he has noticed pain upon lying down to go to sleep as well.  He was taking Motrin and Naproxen with slight improvement but pain would persist and did not specifically improve with positional changes. He denied any associated orthopnea, PND or lower extremity edema. No recent viral illnesses. He  did report a strong family history of CAD with his father having required CABG in his 56's. The patient does smoke approximately 1.5 packs/day and has done so since age 23. He denied any alcohol use or recreational drug use.   Initial labs showed WBC 13.2, Hgb 14.4, platelets 313, Na+ 135, K+ 3.5 and creatinine 0.85. Initial Hs Troponin 607 with repeat values of 687 and 415. Negative for COVID and Influenza. CXR with no  active cardiopulmonary disease. CTA shows no evidence of aortic dissection or PE. Was noted to have stable lymph nodes unchanged from 2018. Report mentioned no coronary calcifications noted. EKG showed NSR, HR 82 with slight ST depression along the inferior leads.    He was started on IV Heparin and ASA 81mg  daily. Continued on PTA Amlodipine 10mg  daily and Crestor 40mg  daily.   Hospital Course     Non-STEMI: High-sensitivity troponin peaked at 687.  Underwent cardiac catheterization 12/6 with high-grade stenosis of large diagonal branch of 99% treated with PCI/DES x1.  Did have residual disease of 80 to 90% and circumflex.  Also 60% mid/distal disease of the PDA and LV branches. Placed on DAPT with aspirin/plavix post cath with recommendations for at least 1 year.  He continued to have recurrent chest pain post cath and went back to cath 12/7 with successful PCI of mRCA. Recommendations to transition from plavix to Brilinta. She was loaded with Brilinta 180mg  x1, then 90mg  BID  -- continue on aspirin/Brilinta, statin, beta-blocker -- seen CR   Hypertension: Blood pressures borderline elevated. --Increased metoprolol to 25 mg twice daily --Transitioned  amlodipine down to 5mg  daily and added to lisinopril 20mg  daily given his history of diabetes   Hyperlipidemia: LDL 178 --Now on Crestor 40 mg daily --FLP/LFTs in 8 weeks   Diabetes: Hemoglobin A1c 7.4 --Resume metformin, glipizide at discharge --Started farxiga at discharge    Tobacco use: Cessation --Nicotine patch at discharge   General: Well developed, well nourished, male appearing in no acute distress. Head: Normocephalic, atraumatic.  Neck: Supple without bruits, JVD. Lungs:  Resp regular and unlabored, CTA. Heart: RRR, S1, S2, no S3, S4, or murmur; no rub. Abdomen: Soft, non-tender, non-distended with normoactive bowel sounds. No hepatomegaly. No rebound/guarding. No obvious abdominal masses. Extremities: No clubbing, cyanosis,  edema. Distal pedal pulses are 2+ bilaterally. Right radial cath site stable without bruising or hematoma Neuro: Alert and oriented X 3. Moves all extremities spontaneously. Psych: Normal affect.   Did the patient have an acute coronary syndrome (MI, NSTEMI, STEMI, etc) this admission?:  Yes                               AHA/ACC Clinical Performance & Quality Measures: Aspirin prescribed? - Yes ADP Receptor Inhibitor (Plavix/Clopidogrel, Brilinta/Ticagrelor or Effient/Prasugrel) prescribed (includes medically managed patients)? - Yes Beta Blocker prescribed? - Yes High Intensity Statin (Lipitor 40-80mg  or Crestor 20-40mg ) prescribed? - Yes EF assessed during THIS hospitalization? - Yes For EF <40%, was ACEI/ARB prescribed? - Yes For EF <40%, Aldosterone Antagonist (Spironolactone or Eplerenone) prescribed? - Not Applicable (EF >/= 16%) Cardiac Rehab Phase II ordered (including medically managed patients)? - Yes       The patient will be scheduled for a TOC follow up appointment in 10-14 days.  A message has been sent to the [facility] and Scheduling Pool at the office where the patient should be seen for follow up.  _____________  Discharge Vitals Blood  pressure 138/90, pulse 70, temperature 98.1 F (36.7 C), temperature source Oral, resp. rate 16, height 6\' 1"  (1.854 m), weight 88.8 kg, SpO2 98 %.  Filed Weights   12/30/20 1747 12/30/20 2334  Weight: 88.5 kg 88.8 kg    Labs & Radiologic Studies    CBC Recent Labs    01/01/21 0406 01/02/21 0126  WBC 9.9 10.5  HGB 13.0 12.2*  HCT 36.3* 35.1*  MCV 78.9* 80.9  PLT 239 696   Basic Metabolic Panel Recent Labs    12/31/20 0603 01/01/21 0406 01/02/21 0126  NA 137 136 135  K 3.5 3.6 3.5  CL 105 103 104  CO2 24 25 24   GLUCOSE 254* 236* 168*  BUN 17 13 14   CREATININE 0.86 1.03 0.83  CALCIUM 8.7* 8.7* 8.9  MG 2.1  --   --   PHOS 3.2  --   --    Liver Function Tests Recent Labs    12/31/20 0603  AST 10*  ALT 12   ALKPHOS 88  BILITOT 0.5  PROT 6.0*  ALBUMIN 3.8   No results for input(s): LIPASE, AMYLASE in the last 72 hours. High Sensitivity Troponin:   Recent Labs  Lab 12/30/20 1804 12/30/20 2018 12/31/20 0603 12/31/20 0745  TROPONINIHS 607* 687* 415* 408*    BNP Invalid input(s): POCBNP D-Dimer No results for input(s): DDIMER in the last 72 hours. Hemoglobin A1C Recent Labs    12/31/20 0603  HGBA1C 7.4*   Fasting Lipid Panel Recent Labs    01/01/21 0406  CHOL 248*  HDL 30*  LDLCALC 178*  TRIG 202*  CHOLHDL 8.3   Thyroid Function Tests No results for input(s): TSH, T4TOTAL, T3FREE, THYROIDAB in the last 72 hours.  Invalid input(s): FREET3 _____________  DG Chest 2 View  Result Date: 12/30/2020 CLINICAL DATA:  Chest pain. EXAM: CHEST - 2 VIEW COMPARISON:  Chest radiograph dated 06/11/2020. FINDINGS: The heart size and mediastinal contours are within normal limits. Both lungs are clear. The visualized skeletal structures are unremarkable. IMPRESSION: No active cardiopulmonary disease. Electronically Signed   By: Anner Crete M.D.   On: 12/30/2020 18:36   CARDIAC CATHETERIZATION  Result Date: 01/01/2021   Prox Cx lesion is 85% stenosed.  This was a small vessel that was diffusely diseased.   Prox RCA to Mid RCA lesion is 80% stenosed.   A drug-eluting stent was successfully placed using a STENT ONYX FRONTIER 4.0X18, posdilated to 4.5 mm.   Post intervention, there is a 0% residual stenosis.   Dist RCA lesion is 50% stenosed.   Diagonal stent was widely patent.   LV end diastolic pressure is normal.   There is no aortic valve stenosis. Successful PCI of RCA.  He needs aggressive secondary prevention, smoking cessation.  Would consider changing Plavix to Brilinta since he had ACS.   CARDIAC CATHETERIZATION  Result Date: 12/31/2020 CONCLUSIONS: High-grade stenosis in the large mid diagonal branch 99% reduced to 0% with TIMI-3 flow using a 2.0 x 12 Onyx frontier.  Chest  discomfort was dissimilar to presenting complaint. Left main is normal LAD has mild diffuse disease Circumflex contains 80 to 90% proximal stenosis.  The vessel is diffusely diseased and gives origin to 2-2 small obtuse marginal branches.  If symptoms continue, perhaps this proximal lesion should be stented. RCA has a "rosary bead "appearance.  It is a large vessel.  There is 60% mid, 60% distal, diffuse disease in the PDA and LV branches. Left ventriculography demonstrated mid anterior  wall hypokinesis.  EF 50%.  LVEDP is normal. RECOMMENDATIONS: Aggressive risk factor modification. Smoking cessation. Monitor for recurrent symptoms and consider PCI of circumflex if symptoms do not abate with PCI of this moderate-sized diagonal branch  ECHOCARDIOGRAM COMPLETE  Result Date: 12/31/2020    ECHOCARDIOGRAM REPORT   Patient Name:   Dragon Percell Miller Date of Exam: 12/31/2020 Medical Rec #:  [id number]      Height:       73.0 in Accession #:    [id number]     Weight:       195.8 lb Date of Birth:  20-Nov-1970      BSA:          2.132 m Patient Age:    50 years       BP:           141/88 mmHg Patient Gender: M              HR:           75 bpm. Exam Location:  [facility] Procedure: 2D Echo, Cardiac Doppler and Color Doppler Indications:    NSTEMI  History:        Patient has no prior history of Echocardiogram examinations.                 Signs/Symptoms:Chest Pain; Risk Factors:Hypertension, Diabetes,                 Dyslipidemia and Current Smoker.  Sonographer:    Wenda Low Referring Phys: [id number] [place]  1. Left ventricular ejection fraction, by estimation, is 65 to 70%. The left ventricle has normal function. The left ventricle has no regional wall motion abnormalities. There is mild left ventricular hypertrophy. Left ventricular diastolic parameters were normal.  2. Right ventricular systolic function is normal. The right ventricular size is normal. Tricuspid regurgitation signal is  inadequate for assessing PA pressure.  3. The mitral valve is normal in structure. Trivial mitral valve regurgitation. No evidence of mitral stenosis.  4. The aortic valve is tricuspid. Aortic valve regurgitation is not visualized. No aortic stenosis is present.  5. The inferior vena cava is normal in size with greater than 50% respiratory variability, suggesting right atrial pressure of 3 mmHg. FINDINGS  Left Ventricle: Left ventricular ejection fraction, by estimation, is 65 to 70%. The left ventricle has normal function. The left ventricle has no regional wall motion abnormalities. The left ventricular internal cavity size was normal in size. There is  mild left ventricular hypertrophy. Left ventricular diastolic parameters were normal. Right Ventricle: The right ventricular size is normal. No increase in right ventricular wall thickness. Right ventricular systolic function is normal. Tricuspid regurgitation signal is inadequate for assessing PA pressure. Left Atrium: Left atrial size was normal in size. Right Atrium: Right atrial size was normal in size. Pericardium: There is no evidence of pericardial effusion. Mitral Valve: The mitral valve is normal in structure. Trivial mitral valve regurgitation. No evidence of mitral valve stenosis. MV peak gradient, 2.3 mmHg. The mean mitral valve gradient is 1.0 mmHg. Tricuspid Valve: The tricuspid valve is normal in structure. Tricuspid valve regurgitation is not demonstrated. No evidence of tricuspid stenosis. Aortic Valve: The aortic valve is tricuspid. Aortic valve regurgitation is not visualized. No aortic stenosis is present. Aortic valve mean gradient measures 3.0 mmHg. Aortic valve peak gradient measures 5.2 mmHg. Aortic valve area, by VTI measures 3.29 cm. Pulmonic Valve: The pulmonic valve was not well visualized. Pulmonic valve  regurgitation is mild. No evidence of pulmonic stenosis. Aorta: The aortic root is normal in size and structure. Venous: The inferior  vena cava is normal in size with greater than 50% respiratory variability, suggesting right atrial pressure of 3 mmHg. IAS/Shunts: No atrial level shunt detected by color flow Doppler.  LEFT VENTRICLE PLAX 2D LVIDd:         4.60 cm     Diastology LVIDs:         2.60 cm     LV e' medial:    5.35 cm/s LV PW:         1.10 cm     LV E/e' medial:  12.3 LV IVS:        1.10 cm     LV e' lateral:   11.80 cm/s LVOT diam:     2.30 cm     LV E/e' lateral: 5.6 LV SV:         77 LV SV Index:   36 LVOT Area:     4.15 cm  LV Volumes (MOD) LV vol d, MOD A2C: 96.8 ml LV vol d, MOD A4C: 62.1 ml LV vol s, MOD A2C: 43.7 ml LV vol s, MOD A4C: 26.9 ml LV SV MOD A2C:     53.1 ml LV SV MOD A4C:     62.1 ml LV SV MOD BP:      43.8 ml RIGHT VENTRICLE RV Basal diam:  3.40 cm RV Mid diam:    3.40 cm RV S prime:     14.80 cm/s TAPSE (M-mode): 2.7 cm LEFT ATRIUM             Index        RIGHT ATRIUM           Index LA diam:        3.50 cm 1.64 cm/m   RA Area:     22.10 cm LA Vol (A2C):   72.4 ml 33.96 ml/m  RA Volume:   68.70 ml  32.22 ml/m LA Vol (A4C):   53.5 ml 25.09 ml/m LA Biplane Vol: 64.9 ml 30.44 ml/m  AORTIC VALVE                    PULMONIC VALVE AV Area (Vmax):    3.26 cm     PV Vmax:       0.73 m/s AV Area (Vmean):   3.02 cm     PV Peak grad:  2.1 mmHg AV Area (VTI):     3.29 cm AV Vmax:           114.00 cm/s AV Vmean:          79.400 cm/s AV VTI:            0.235 m AV Peak Grad:      5.2 mmHg AV Mean Grad:      3.0 mmHg LVOT Vmax:         89.40 cm/s LVOT Vmean:        57.700 cm/s LVOT VTI:          0.186 m LVOT/AV VTI ratio: 0.79  AORTA Ao Root diam: 3.70 cm Ao Asc diam:  3.30 cm MITRAL VALVE MV Area (PHT): 3.00 cm    SHUNTS MV Area VTI:   3.23 cm    Systemic VTI:  0.19 m MV Peak grad:  2.3 mmHg    Systemic Diam: 2.30 cm MV Mean grad:  1.0 mmHg MV Vmax:  0.75 m/s MV Vmean:      43.0 cm/s MV Decel Time: 253 msec MV E velocity: 65.60 cm/s MV A velocity: 61.70 cm/s MV E/A ratio:  1.06 [name] MD Electronically  signed by [name] MD Signature Date/Time: 12/31/2020/11:23:06 AM    Final    [place] LIMITED JOINT SPACE STRUCTURES UP RIGHT(NO LINKED CHARGES)  Result Date: 12/26/2020 Procedure: Ultrasound Guided Glenohumeral Joint Injection Side: Right Diagnosis: Adhesive capsulitis [place] Indication: - accuracy is paramount for diagnosis - to ensure therapeutic efficacy or procedural success - to reduce procedural risk   After PARQ discussed and consent was given verbally. The site was cleaned with chlorhexidine prep. An ultrasound transducer was placed on the posterior shoulder.  The posterior capsule, labrum, and infraspinatus were identified.  A steroid injection was performed under ultrasound guidance with sterile technique using  67ml of 1% lidocaine without epinephrine and 80 mg of triamcinolone (KENALOG) 40mg /ml. This was well tolerated and resulted in relief.  Needle was removed and dressing placed and post injection instructions were given including  a discussion of likely return of pain today after the anesthetic wears off (with the possibility of worsened pain) until the steroid starts to work in 1-3 days.   Pt was advised to call or return to clinic if these symptoms worsen or fail to improve as anticipated.     CT Angio Chest/Abd/Pel for Dissection W and/or W/WO  Result Date: 12/30/2020 CLINICAL DATA:  Chest and abdominal pain for several hours, initial encounter EXAM: CT ANGIOGRAPHY CHEST, ABDOMEN AND PELVIS TECHNIQUE: Non-contrast CT of the chest was initially obtained. Multidetector CT imaging through the chest, abdomen and pelvis was performed using the standard protocol during bolus administration of intravenous contrast. Multiplanar reconstructed images and MIPs were obtained and reviewed to evaluate the vascular anatomy. CONTRAST:  55mL OMNIPAQUE IOHEXOL 350 MG/ML SOLN COMPARISON:  CT from 10/24/2016, 05/29/2020 FINDINGS: CTA CHEST FINDINGS Cardiovascular: Precontrast images show no hyperdense crescent  to suggest acute aortic injury. Post-contrast images show no aneurysmal dilatation or dissection. No cardiac enlargement is seen. No coronary calcifications are noted. Pulmonary artery as visualized is within normal limits although not timed for embolus evaluation. Mediastinum/Nodes: Thoracic inlet is within normal limits. No sizable hilar or mediastinal adenopathy is noted. The esophagus is within normal limits. Lungs/Pleura: Small ovoid nodular density is noted along the major fissure on the left best seen on image number 68 consistent with small subpleural lymph node. This is stable from the prior exam and consistent with a benign etiology. No focal infiltrate or sizable effusion is seen. No parenchymal nodules are noted. Musculoskeletal: Degenerative changes of the thoracic spine are noted. No acute rib abnormality is seen. Review of the MIP images confirms the above findings. CTA ABDOMEN AND PELVIS FINDINGS VASCULAR Aorta: Abdominal aorta shows no aneurysmal dilatation or dissection. Minimal atherosclerotic calcifications are seen. Celiac: Variant anatomy is noted with the common hepatic artery arising directly from the aorta. Left gastric and splenic artery are within normal limits. SMA: Patent without evidence of aneurysm, dissection, vasculitis or significant stenosis. Renals: Both renal arteries are patent without evidence of aneurysm, dissection, vasculitis, fibromuscular dysplasia or significant stenosis. IMA: Patent without evidence of aneurysm, dissection, vasculitis or significant stenosis. Inflow: Iliacs show mild eccentric plaque in the proximal left common iliac artery although no stenosis is seen. No other focal abnormality is noted. Veins: No specific venous abnormality is noted. Review of the MIP images confirms the above findings. NON-VASCULAR Hepatobiliary: Fatty infiltration of the liver  is noted. Gallbladder is within normal limits. Pancreas: Unremarkable. No pancreatic ductal dilatation or  surrounding inflammatory changes. Spleen: Normal in size without focal abnormality. Adrenals/Urinary Tract: Adrenal glands are within normal limits. Kidneys demonstrate a normal enhancement pattern bilaterally. No renal calculi or obstructive changes are seen. The ureters are within normal limits. Bladder is partially distended. Stomach/Bowel: Appendix is within normal limits. No obstructive or inflammatory changes of the colon seen. Small bowel and stomach are within normal limits. Lymphatic: No significant lymphadenopathy is noted. Reproductive: Prostate is unremarkable. Other: No abdominal wall hernia or abnormality. No abdominopelvic ascites. Musculoskeletal: No acute bony abnormality is noted. Degenerative changes of lumbar spine are seen. Review of the MIP images confirms the above findings. IMPRESSION: CTA of the chest: No evidence of aortic dissection or pulmonary embolism. Stable subpleural lymph nodes unchanged from 2018. No acute abnormality is noted. CTA of the abdomen and pelvis: Mild atherosclerotic changes are seen without acute dissection or aneurysmal dilatation. Fatty infiltration of the liver. No other focal abnormality is noted. Electronically Signed   By: Inez Catalina M.D.   On: 12/30/2020 20:19   Disposition   Pt is being discharged home today in good condition.  Follow-up Plans & Appointments     Follow-up Information     Theora Gianotti, NP Follow up on 01/22/2021.   Specialties: Nurse Practitioner, Cardiology, Radiology Why: at 2:15pm for your follow up appt Contact information: [place] [address] [phone]                Discharge Instructions     AMB Referral to Cardiac Rehabilitation - Phase II   Complete by: As directed    Diagnosis: Coronary Stents   After initial evaluation and assessments completed: Virtual Based Care may be provided alone or in conjunction with Phase 2 Cardiac Rehab based on patient barriers.: Yes   Amb  Referral to Cardiac Rehabilitation   Complete by: As directed    Diagnosis:  Coronary Stents NSTEMI     After initial evaluation and assessments completed: Virtual Based Care may be provided alone or in conjunction with Phase 2 Cardiac Rehab based on patient barriers.: Yes   Call MD for:  difficulty breathing, headache or visual disturbances   Complete by: As directed    Call MD for:  persistant dizziness or light-headedness   Complete by: As directed    Call MD for:  redness, tenderness, or signs of infection (pain, swelling, redness, odor or green/yellow discharge around incision site)   Complete by: As directed    Diet - low sodium heart healthy   Complete by: As directed    Discharge instructions   Complete by: As directed    Radial Site Care Refer to this sheet in the next few weeks. These instructions provide you with information on caring for yourself after your procedure. Your caregiver may also give you more specific instructions. Your treatment has been planned according to current medical practices, but problems sometimes occur. Call your caregiver if you have any problems or questions after your procedure. HOME CARE INSTRUCTIONS You may shower the day after the procedure. Remove the bandage (dressing) and gently wash the site with plain soap and water. Gently pat the site dry.  Do not apply powder or lotion to the site.  Do not submerge the affected site in water for 3 to 5 days.  Inspect the site at least twice daily.  Do not flex or bend the affected arm for  24 hours.  No lifting over 5 pounds (2.3 kg) for 5 days after your procedure.  Do not drive home if you are discharged the same day of the procedure. Have someone else drive you.  You may drive 24 hours after the procedure unless otherwise instructed by your caregiver.  What to expect: Any bruising will usually fade within 1 to 2 weeks.  Blood that collects in the tissue (hematoma) may be painful to the touch. It should  usually decrease in size and tenderness within 1 to 2 weeks.  SEEK IMMEDIATE MEDICAL CARE IF: You have unusual pain at the radial site.  You have redness, warmth, swelling, or pain at the radial site.  You have drainage (other than a small amount of blood on the dressing).  You have chills.  You have a fever or persistent symptoms for more than 72 hours.  You have a fever and your symptoms suddenly get worse.  Your arm becomes pale, cool, tingly, or numb.  You have heavy bleeding from the site. Hold pressure on the site.    PLEASE DO NOT MISS ANY DOSES OF YOUR BRILINTA!!!!! Also keep a log of you blood pressures and bring back to your follow up appt. Please call the office with any questions.   Patients taking blood thinners should generally stay away from medicines like ibuprofen, Advil, Motrin, naproxen, and Aleve due to risk of stomach bleeding. You may take Tylenol as directed or talk to your primary doctor about alternatives.  Some studies suggest Prilosec/Omeprazole interacts with Plavix. We changed your Prilosec/Omeprazole to the equivalent dose of Protonix for less chance of interaction.  PLEASE ENSURE THAT YOU DO NOT RUN OUT OF YOUR BRILINTA. This medication is very important to remain on for at least one year. IF you have issues obtaining this medication due to cost please CALL the office 3-5 business days prior to running out in order to prevent missing doses of this medication.   Increase activity slowly   Complete by: As directed        Discharge Medications   Allergies as of 01/02/2021   No Known Allergies      Medication List     TAKE these medications    albuterol 108 (90 Base) MCG/ACT inhaler Commonly known as: VENTOLIN HFA Inhale 2 puffs into the lungs every 6 (six) hours as needed for wheezing or shortness of breath.   amLODipine 5 MG tablet Commonly known as: NORVASC Take 1 tablet (5 mg total) by mouth daily. What changed:  medication strength See the  new instructions.   aspirin 81 MG EC tablet Take 1 tablet (81 mg total) by mouth daily. Swallow whole. What changed:  medication strength how much to take additional instructions   cyanocobalamin 1000 MCG tablet Take 100 mcg by mouth daily.   dapagliflozin propanediol 10 MG Tabs tablet Commonly known as: FARXIGA Take 1 tablet (10 mg total) by mouth daily.   glipiZIDE 5 MG 24 hr tablet Commonly known as: GLUCOTROL XL Take 2 tablets (10 mg total) by mouth daily with breakfast.   glucose blood test strip Commonly known as: OneTouch Verio Test sugars twice daily   lisinopril 20 MG tablet Commonly known as: ZESTRIL Take 1 tablet (20 mg total) by mouth daily.   metFORMIN 850 MG tablet Commonly known as: GLUCOPHAGE TAKE 1 TABLET(850 MG) BY MOUTH TWICE DAILY WITH A MEAL. Start taking on: January 03, 2021   metoprolol tartrate 25 MG tablet Commonly known as: LOPRESSOR Take  1 tablet (25 mg total) by mouth 2 (two) times daily.   nicotine 21 mg/24hr patch Commonly known as: NICODERM CQ - dosed in mg/24 hours Place 1 patch (21 mg total) onto the skin daily.   nitroGLYCERIN 0.4 MG SL tablet Commonly known as: Nitrostat Place 1 tablet (0.4 mg total) under the tongue every 5 (five) minutes as needed for chest pain.   OneTouch Delica Lancets 22G Misc Test sugars twice daily   pantoprazole 40 MG tablet Commonly known as: PROTONIX TAKE 1 TABLET(40 MG) BY MOUTH DAILY   rosuvastatin 40 MG tablet Commonly known as: Crestor Take 1 tablet (40 mg total) by mouth daily.   ticagrelor 90 MG Tabs tablet Commonly known as: BRILINTA Take 1 tablet (90 mg total) by mouth 2 (two) times daily.   Trulance 3 MG Tabs Generic drug: Plecanatide Take 3 mg by mouth daily.         Outstanding Labs/Studies   N/a   Duration of Discharge Encounter   Greater than 30 minutes including physician time.  Signed, Reino Bellis, NP 01/02/2021, 10:03 AM   Patient seen and examined. Agree  with assessment and plan.  Patient feels well without recurrent chest pain.  He is status post PCI to his RCA which appeared tighter as initially felt on his original catheterization.  The RCA is a large vessel which was successfully stented.  He still has residual proximal circumflex stenosis but his distal circumflex is very small caliber.  Presently, we will add lisinopril at 20 mg in light of his diabetes mellitus, decrease amlodipine from 10 mg down to 5 mg and beta-blocker has been initiated.  Procedure has been added for his diabetes.  I discussed the importance of absolute smoking cessation.  Plan discharge today with office visit follow-up in 2 weeks.   Troy Sine, MD, [facility] 01/02/2021 10:03 AM

## 2021-01-02 NOTE — Plan of Care (Signed)

## 2021-01-03 ENCOUNTER — Emergency Department (HOSPITAL_COMMUNITY)
Admission: EM | Admit: 2021-01-03 | Discharge: 2021-01-03 | Disposition: A | Discharge status: Home / Self Care | Payer: BC Managed Care – PPO | Attending: Emergency Medicine | Admitting: Emergency Medicine

## 2021-01-03 ENCOUNTER — Other Ambulatory Visit: Payer: Self-pay

## 2021-01-03 ENCOUNTER — Encounter (HOSPITAL_COMMUNITY): Payer: Self-pay | Admitting: Emergency Medicine

## 2021-01-03 ENCOUNTER — Emergency Department (HOSPITAL_COMMUNITY): Payer: BC Managed Care – PPO

## 2021-01-03 ENCOUNTER — Telehealth: Payer: Self-pay

## 2021-01-03 DIAGNOSIS — E119 Type 2 diabetes mellitus without complications: Secondary | ICD-10-CM | POA: Insufficient documentation

## 2021-01-03 DIAGNOSIS — K5900 Constipation, unspecified: Secondary | ICD-10-CM | POA: Insufficient documentation

## 2021-01-03 DIAGNOSIS — R0689 Other abnormalities of breathing: Secondary | ICD-10-CM | POA: Diagnosis not present

## 2021-01-03 DIAGNOSIS — I110 Hypertensive heart disease with heart failure: Secondary | ICD-10-CM | POA: Diagnosis not present

## 2021-01-03 DIAGNOSIS — I509 Heart failure, unspecified: Secondary | ICD-10-CM | POA: Insufficient documentation

## 2021-01-03 DIAGNOSIS — F17210 Nicotine dependence, cigarettes, uncomplicated: Secondary | ICD-10-CM | POA: Diagnosis not present

## 2021-01-03 DIAGNOSIS — R109 Unspecified abdominal pain: Secondary | ICD-10-CM | POA: Diagnosis not present

## 2021-01-03 DIAGNOSIS — R1033 Periumbilical pain: Secondary | ICD-10-CM | POA: Diagnosis not present

## 2021-01-03 DIAGNOSIS — R1084 Generalized abdominal pain: Secondary | ICD-10-CM | POA: Diagnosis not present

## 2021-01-03 DIAGNOSIS — I10 Essential (primary) hypertension: Secondary | ICD-10-CM | POA: Diagnosis not present

## 2021-01-03 LAB — CBC
HCT: 36.5 % — ABNORMAL LOW (ref 39.0–52.0)
Hemoglobin: 13 g/dL (ref 13.0–17.0)
MCH: 29.5 pg (ref 26.0–34.0)
MCHC: 35.6 g/dL (ref 30.0–36.0)
MCV: 82.8 fL (ref 80.0–100.0)
Platelets: 222 10*3/uL (ref 150–400)
RBC: 4.41 MIL/uL (ref 4.22–5.81)
RDW: 12.8 % (ref 11.5–15.5)
WBC: 13.5 10*3/uL — ABNORMAL HIGH (ref 4.0–10.5)
nRBC: 0 % (ref 0.0–0.2)

## 2021-01-03 LAB — COMPREHENSIVE METABOLIC PANEL
ALT: 17 U/L (ref 0–44)
AST: 16 U/L (ref 15–41)
Albumin: 4 g/dL (ref 3.5–5.0)
Alkaline Phosphatase: 91 U/L (ref 38–126)
Anion gap: 8 (ref 5–15)
BUN: 23 mg/dL — ABNORMAL HIGH (ref 6–20)
CO2: 23 mmol/L (ref 22–32)
Calcium: 8.5 mg/dL — ABNORMAL LOW (ref 8.9–10.3)
Chloride: 105 mmol/L (ref 98–111)
Creatinine, Ser: 0.99 mg/dL (ref 0.61–1.24)
GFR, Estimated: >60 mL/min (ref >60)
Glucose, Bld: 175 mg/dL — ABNORMAL HIGH (ref 70–99)
Potassium: 3.7 mmol/L (ref 3.5–5.1)
Sodium: 136 mmol/L (ref 135–145)
Total Bilirubin: 1 mg/dL (ref 0.3–1.2)
Total Protein: 6.8 g/dL (ref 6.5–8.1)

## 2021-01-03 LAB — TROPONIN I (HIGH SENSITIVITY)
Troponin I (High Sensitivity): 293 ng/L (ref <18)
Troponin I (High Sensitivity): 277 ng/L (ref <18)

## 2021-01-03 LAB — LIPASE, BLOOD: Lipase: 48 U/L (ref 11–51)

## 2021-01-03 MED ORDER — POLYETHYLENE GLYCOL 3350 17 G PO PACK: 17.0000 g | PACK | Freq: Every day | ORAL | 1 refills | Status: DC

## 2021-01-03 MED ORDER — BISACODYL 10 MG RE SUPP: 10.0000 mg | RECTAL | 0 refills | Status: DC | PRN

## 2021-01-03 MED ORDER — IOHEXOL 300 MG/ML  SOLN
100.0000 mL | Freq: Once | INTRAMUSCULAR | Status: AC | PRN
  Administered 2021-01-03: 100 mL via INTRAVENOUS

## 2021-01-03 MED ORDER — HYDROMORPHONE HCL 1 MG/ML IJ SOLN
0.5000 mg | Freq: Once | INTRAMUSCULAR | Status: AC
  Administered 2021-01-03: 0.5 mg via INTRAVENOUS
  Filled 2021-01-03: qty 1

## 2021-01-03 MED ORDER — SODIUM CHLORIDE 0.9 % IV BOLUS
1000.0000 mL | Freq: Once | INTRAVENOUS | Status: AC
  Administered 2021-01-03: 1000 mL via INTRAVENOUS

## 2021-01-03 NOTE — Discharge Instructions (Signed)
You were evaluated in the Emergency Department and after careful evaluation, we did not find any emergent condition requiring admission or further testing in the hospital.  Your exam/testing today is overall reassuring.  Symptoms may be due to constipation.  Recommend daily use of Metamucil to add more fiber to your diet.  This is found over-the-counter.  Recommend taking MiraLAX up to 6 times daily until you achieve soft stools.  Can also use the Dulcolax suppositories as needed to achieve soft stools.  Can decrease these medicines to daily use or as needed once you start having soft bowel movements.  Please return to the Emergency Department if you experience any worsening of your condition.   Thank you for allowing [place] to be a part of your care.

## 2021-01-03 NOTE — Telephone Encounter (Signed)
-----   Message from Cheryln Manly, NP sent at 01/02/2021 10:03 AM EST ----- Regarding: Needs TOC call Needs a TOC call please, thanks!

## 2021-01-03 NOTE — ED Triage Notes (Signed)
Pt brought in by [facility] for abd pain that woke him up tonight. Pt recently admitted for NSTEMI and had stent placed to mid RCA on 12/7. Pt denies any chest pain.

## 2021-01-03 NOTE — ED Provider Notes (Signed)
[facility] Emergency Department Provider Note MRN:  [id number]  Arrival date & time: 01/03/21     Chief Complaint   Abdominal Pain   History of Present Illness   Jacob Baker is a 50 y.o. year-old male with a history of CHF, diabetes, NSTEMI presenting to the ED with chief complaint of abdominal.  Location: Periumbilical Duration: A few hours Onset: Sudden Timing: Constant Description: Aches Severity: Moderate Exacerbating/Alleviating Factors: None Associated Symptoms: No bowel movement for 3 to 4 days Pertinent Negatives: No nausea vomiting, no chest pain or shortness of breath  Additional History: Recently discharged from the hospital after having a heart attack   Review of Systems  A complete 10 system review of systems was obtained and all systems are negative except as noted in the HPI and PMH.   Patient's Health History    Past Medical History:  Diagnosis Date   CHF (congestive heart failure) (HCC)    Diabetes mellitus without complication (HCC)    DVT (deep venous thrombosis) ([place]) 04/2017   GERD (gastroesophageal reflux disease)    History of migraine    none in over 2 yrs   Hyperlipidemia    Hypertension    Syncope and collapse    x1 - approx 2-3 yrs ago - dehydration    Past Surgical History:  Procedure Laterality Date   COLONOSCOPY     COLONOSCOPY WITH PROPOFOL N/A 08/06/2017   Procedure: COLONOSCOPY WITH PROPOFOL;  Surgeon: Lucilla Lame, MD;  Location: [place];  Service: Endoscopy;  Laterality: N/A;  Diabetic - oral meds   CORONARY STENT INTERVENTION N/A 12/31/2020   Procedure: CORONARY STENT INTERVENTION;  Surgeon: Belva Crome, MD;  Location: [place] [facility];  Service: Cardiovascular;  Laterality: N/A;   CORONARY STENT INTERVENTION N/A 01/01/2021   Procedure: CORONARY STENT INTERVENTION;  Surgeon: [name], MD;  Location: [place] [facility];  Service: Cardiovascular;  Laterality: N/A;   ESOPHAGEAL  DILATION  09/24/2016   Procedure: ESOPHAGEAL DILATION;  Surgeon: Lucilla Lame, MD;  Location: [place];  Service: Gastroenterology;;   ESOPHAGOGASTRODUODENOSCOPY (EGD) WITH PROPOFOL N/A 04/08/2015   Procedure: ESOPHAGOGASTRODUODENOSCOPY (EGD) WITH PROPOFOL with dialation;  Surgeon: Lucilla Lame, MD;  Location: [place];  Service: Endoscopy;  Laterality: N/A;  diabetic - oral meds   ESOPHAGOGASTRODUODENOSCOPY (EGD) WITH PROPOFOL N/A 09/24/2016   Procedure: ESOPHAGOGASTRODUODENOSCOPY (EGD) WITH PROPOFOL WITH DILATION;  Surgeon: Lucilla Lame, MD;  Location: [place];  Service: Gastroenterology;  Laterality: N/A;  Diabetic - oral meds   FINGER AMPUTATION  2009   partial removal left index finger    LEFT HEART CATH AND CORONARY ANGIOGRAPHY N/A 12/31/2020   Procedure: LEFT HEART CATH AND CORONARY ANGIOGRAPHY;  Surgeon: Belva Crome, MD;  Location: [place] [facility];  Service: Cardiovascular;  Laterality: N/A;   LEFT HEART CATH AND CORONARY ANGIOGRAPHY N/A 01/01/2021   Procedure: LEFT HEART CATH AND CORONARY ANGIOGRAPHY;  Surgeon: [name], MD;  Location: [place] [facility];  Service: Cardiovascular;  Laterality: N/A;   POLYPECTOMY  08/06/2017   Procedure: POLYPECTOMY INTESTINAL;  Surgeon: Lucilla Lame, MD;  Location: [place];  Service: Endoscopy;;    Family History  Problem Relation Age of Onset   Heart disease Father        CAD   Hypertension Father    Diabetes Father    Hypertension Sister    Hypertension Brother    Hypertension Brother    Hypertension Mother  Social History   Socioeconomic History   Marital status: Married    Spouse name: Not on file   Number of children: Not on file   Years of education: Not on file   Highest education level: Not on file  Occupational History   Not on file  Tobacco Use   Smoking status: Every Day    Packs/day: 2.00    Years: 27.00    Pack years: 54.00    Types: Cigarettes   Smokeless  tobacco: Never  Vaping Use   Vaping Use: Never used  Substance and Sexual Activity   Alcohol use: No   Drug use: No   Sexual activity: Not on file  Other Topics Concern   Not on file  Social History Narrative   Not on file   Social Determinants of Health   Financial Resource Strain: Not on file  Food Insecurity: Not on file  Transportation Needs: Not on file  Physical Activity: Not on file  Stress: Not on file  Social Connections: Not on file  Intimate Partner Violence: Not on file     Physical Exam   Vitals:   01/03/21 0500 01/03/21 0530  BP: (!) 142/81 (!) 144/90  Pulse: 77 72  Resp: 15 12  Temp:    SpO2: 97% 97%    CONSTITUTIONAL: Well-appearing, NAD NEURO:  Alert and oriented x 3, no focal deficits EYES:  eyes equal and reactive ENT/NECK:  no LAD, no JVD CARDIO: Regular rate, well-perfused, normal S1 and S2 PULM:  CTAB no wheezing or rhonchi GI/GU:  normal bowel sounds, non-distended, non-tender MSK/SPINE:  No gross deformities, no edema SKIN:  no rash, atraumatic PSYCH:  Appropriate speech and behavior  *Additional and/or pertinent findings included in MDM below  Diagnostic and Interventional Summary    EKG Interpretation  Date/Time:  Friday January 03 2021 03:14:54 EST Ventricular Rate:  69 PR Interval:  207 QRS Duration: 85 QT Interval:  382 QTC Calculation: 410 R Axis:   8 Text Interpretation: Sinus rhythm Borderline prolonged PR interval Confirmed by Gerlene Fee [phone]) on 01/03/2021 6:21:39 AM       Labs Reviewed  CBC - Abnormal; Notable for the following components:      Result Value   WBC 13.5 (*)    HCT 36.5 (*)    All other components within normal limits  COMPREHENSIVE METABOLIC PANEL - Abnormal; Notable for the following components:   Glucose, Bld 175 (*)    BUN 23 (*)    Calcium 8.5 (*)    All other components within normal limits  TROPONIN I (HIGH SENSITIVITY) - Abnormal; Notable for the following components:   Troponin I  (High Sensitivity) 293 (*)    All other components within normal limits  TROPONIN I (HIGH SENSITIVITY) - Abnormal; Notable for the following components:   Troponin I (High Sensitivity) 277 (*)    All other components within normal limits  LIPASE, BLOOD    CT ABDOMEN PELVIS W CONTRAST  Final Result      Medications  HYDROmorphone (DILAUDID) injection 0.5 mg (0.5 mg Intravenous Given 01/03/21 0359)  sodium chloride 0.9 % bolus 1,000 mL (0 mLs Intravenous Stopped 01/03/21 0543)  iohexol (OMNIPAQUE) 300 MG/ML solution 100 mL (100 mLs Intravenous Contrast Given 01/03/21 0449)     Procedures  /  Critical Care Procedures  ED Course and Medical Decision Making  I have reviewed the triage vital signs, the nursing notes, and pertinent available records from the EMR.  Listed above  are laboratory and imaging tests that I personally ordered, reviewed, and interpreted and then considered in my medical decision making (see below for details).  Periumbilical abdominal pain, awaiting CT to evaluate for pancreatitis, perforated viscus, small bowel obstruction, ileus.     CT is overall reassuring, no evidence of constipation.  Otherwise work-up is unrevealing.  Troponin is elevated but downtrending and given patient's recent NSTEMI this seems like a normal trajectory downward, not having any chest pain at this time, EKG is without ischemic findings.  Patient is appropriate for discharge.  Barth Kirks. Sedonia Small, [place] [email or web address]  Final Clinical Impressions(s) / ED Diagnoses     ICD-10-CM   1. Constipation, unspecified constipation type  K59.00       ED Discharge Orders          Ordered    polyethylene glycol (MIRALAX) 17 g packet  Daily        01/03/21 0703    bisacodyl (DULCOLAX) 10 MG suppository  As needed        01/03/21 0703             Discharge Instructions Discussed with and Provided to Patient:     Discharge  Instructions      You were evaluated in the Emergency Department and after careful evaluation, we did not find any emergent condition requiring admission or further testing in the hospital.  Your exam/testing today is overall reassuring.  Symptoms may be due to constipation.  Recommend daily use of Metamucil to add more fiber to your diet.  This is found over-the-counter.  Recommend taking MiraLAX up to 6 times daily until you achieve soft stools.  Can also use the Dulcolax suppositories as needed to achieve soft stools.  Can decrease these medicines to daily use or as needed once you start having soft bowel movements.  Please return to the Emergency Department if you experience any worsening of your condition.   Thank you for allowing [place] to be a part of your care.        Maudie Flakes, MD 01/03/21 [phone]

## 2021-01-06 ENCOUNTER — Encounter (HOSPITAL_COMMUNITY): Payer: Self-pay | Admitting: *Deleted

## 2021-01-06 ENCOUNTER — Telehealth: Payer: Self-pay | Admitting: Cardiology

## 2021-01-06 ENCOUNTER — Emergency Department (HOSPITAL_COMMUNITY)
Admission: EM | Admit: 2021-01-06 | Discharge: 2021-01-07 | Disposition: A | Discharge status: Home / Self Care | Payer: BC Managed Care – PPO | Source: Home / Self Care | Attending: Emergency Medicine | Admitting: Emergency Medicine

## 2021-01-06 ENCOUNTER — Other Ambulatory Visit: Payer: Self-pay

## 2021-01-06 ENCOUNTER — Emergency Department (HOSPITAL_COMMUNITY): Payer: BC Managed Care – PPO

## 2021-01-06 DIAGNOSIS — K219 Gastro-esophageal reflux disease without esophagitis: Secondary | ICD-10-CM | POA: Diagnosis not present

## 2021-01-06 DIAGNOSIS — Z7982 Long term (current) use of aspirin: Secondary | ICD-10-CM | POA: Diagnosis not present

## 2021-01-06 DIAGNOSIS — Z7984 Long term (current) use of oral hypoglycemic drugs: Secondary | ICD-10-CM | POA: Insufficient documentation

## 2021-01-06 DIAGNOSIS — R7989 Other specified abnormal findings of blood chemistry: Secondary | ICD-10-CM | POA: Diagnosis not present

## 2021-01-06 DIAGNOSIS — Z86718 Personal history of other venous thrombosis and embolism: Secondary | ICD-10-CM | POA: Diagnosis not present

## 2021-01-06 DIAGNOSIS — E119 Type 2 diabetes mellitus without complications: Secondary | ICD-10-CM | POA: Insufficient documentation

## 2021-01-06 DIAGNOSIS — K810 Acute cholecystitis: Secondary | ICD-10-CM | POA: Diagnosis not present

## 2021-01-06 DIAGNOSIS — E1165 Type 2 diabetes mellitus with hyperglycemia: Secondary | ICD-10-CM | POA: Diagnosis not present

## 2021-01-06 DIAGNOSIS — E785 Hyperlipidemia, unspecified: Secondary | ICD-10-CM | POA: Diagnosis not present

## 2021-01-06 DIAGNOSIS — I110 Hypertensive heart disease with heart failure: Secondary | ICD-10-CM | POA: Diagnosis not present

## 2021-01-06 DIAGNOSIS — K828 Other specified diseases of gallbladder: Secondary | ICD-10-CM | POA: Diagnosis not present

## 2021-01-06 DIAGNOSIS — D649 Anemia, unspecified: Secondary | ICD-10-CM | POA: Diagnosis not present

## 2021-01-06 DIAGNOSIS — I2510 Atherosclerotic heart disease of native coronary artery without angina pectoris: Secondary | ICD-10-CM | POA: Diagnosis not present

## 2021-01-06 DIAGNOSIS — Z7902 Long term (current) use of antithrombotics/antiplatelets: Secondary | ICD-10-CM | POA: Diagnosis not present

## 2021-01-06 DIAGNOSIS — Z79899 Other long term (current) drug therapy: Secondary | ICD-10-CM | POA: Insufficient documentation

## 2021-01-06 DIAGNOSIS — G4489 Other headache syndrome: Secondary | ICD-10-CM | POA: Diagnosis not present

## 2021-01-06 DIAGNOSIS — R112 Nausea with vomiting, unspecified: Secondary | ICD-10-CM | POA: Insufficient documentation

## 2021-01-06 DIAGNOSIS — I509 Heart failure, unspecified: Secondary | ICD-10-CM | POA: Diagnosis not present

## 2021-01-06 DIAGNOSIS — R1011 Right upper quadrant pain: Secondary | ICD-10-CM | POA: Diagnosis not present

## 2021-01-06 DIAGNOSIS — J9811 Atelectasis: Secondary | ICD-10-CM | POA: Diagnosis not present

## 2021-01-06 DIAGNOSIS — I252 Old myocardial infarction: Secondary | ICD-10-CM | POA: Diagnosis not present

## 2021-01-06 DIAGNOSIS — E876 Hypokalemia: Secondary | ICD-10-CM | POA: Diagnosis not present

## 2021-01-06 DIAGNOSIS — R110 Nausea: Secondary | ICD-10-CM | POA: Diagnosis not present

## 2021-01-06 DIAGNOSIS — R0789 Other chest pain: Secondary | ICD-10-CM | POA: Insufficient documentation

## 2021-01-06 DIAGNOSIS — R109 Unspecified abdominal pain: Secondary | ICD-10-CM | POA: Diagnosis not present

## 2021-01-06 DIAGNOSIS — F17210 Nicotine dependence, cigarettes, uncomplicated: Secondary | ICD-10-CM | POA: Diagnosis not present

## 2021-01-06 DIAGNOSIS — R1084 Generalized abdominal pain: Secondary | ICD-10-CM | POA: Insufficient documentation

## 2021-01-06 DIAGNOSIS — R079 Chest pain, unspecified: Secondary | ICD-10-CM | POA: Diagnosis not present

## 2021-01-06 DIAGNOSIS — Z20822 Contact with and (suspected) exposure to covid-19: Secondary | ICD-10-CM | POA: Diagnosis not present

## 2021-01-06 DIAGNOSIS — R1013 Epigastric pain: Secondary | ICD-10-CM | POA: Diagnosis not present

## 2021-01-06 DIAGNOSIS — Z89022 Acquired absence of left finger(s): Secondary | ICD-10-CM | POA: Diagnosis not present

## 2021-01-06 DIAGNOSIS — N281 Cyst of kidney, acquired: Secondary | ICD-10-CM | POA: Diagnosis not present

## 2021-01-06 DIAGNOSIS — Z955 Presence of coronary angioplasty implant and graft: Secondary | ICD-10-CM | POA: Diagnosis not present

## 2021-01-06 DIAGNOSIS — Z8249 Family history of ischemic heart disease and other diseases of the circulatory system: Secondary | ICD-10-CM | POA: Diagnosis not present

## 2021-01-06 DIAGNOSIS — I10 Essential (primary) hypertension: Secondary | ICD-10-CM | POA: Diagnosis not present

## 2021-01-06 LAB — CBC
HCT: 36.5 % — ABNORMAL LOW (ref 39.0–52.0)
Hemoglobin: 12.8 g/dL — ABNORMAL LOW (ref 13.0–17.0)
MCH: 29 pg (ref 26.0–34.0)
MCHC: 35.1 g/dL (ref 30.0–36.0)
MCV: 82.8 fL (ref 80.0–100.0)
Platelets: 253 10*3/uL (ref 150–400)
RBC: 4.41 MIL/uL (ref 4.22–5.81)
RDW: 13.4 % (ref 11.5–15.5)
WBC: 13 10*3/uL — ABNORMAL HIGH (ref 4.0–10.5)
nRBC: 0 % (ref 0.0–0.2)

## 2021-01-06 LAB — D-DIMER, QUANTITATIVE: D-Dimer, Quant: 2.44 ug/mL-FEU — ABNORMAL HIGH (ref 0.00–0.50)

## 2021-01-06 MED ORDER — ONDANSETRON HCL 4 MG/2ML IJ SOLN: 4.0000 mg | Freq: Once | INTRAMUSCULAR | Status: DC

## 2021-01-06 MED ORDER — ONDANSETRON 4 MG PO TBDP
4.0000 mg | ORAL_TABLET | Freq: Once | ORAL | Status: AC
  Administered 2021-01-07: 4 mg via ORAL
  Filled 2021-01-06: qty 1

## 2021-01-06 MED ORDER — ALUM & MAG HYDROXIDE-SIMETH 200-200-20 MG/5ML PO SUSP
30.0000 mL | Freq: Once | ORAL | Status: AC
  Administered 2021-01-06: 30 mL via ORAL
  Filled 2021-01-06: qty 30

## 2021-01-06 MED ORDER — LIDOCAINE VISCOUS HCL 2 % MT SOLN
15.0000 mL | Freq: Once | OROMUCOSAL | Status: AC
  Administered 2021-01-06: 15 mL via ORAL
  Filled 2021-01-06: qty 15

## 2021-01-06 MED ORDER — ASPIRIN 81 MG PO CHEW
324.0000 mg | CHEWABLE_TABLET | Freq: Once | ORAL | Status: AC
  Administered 2021-01-06: 324 mg via ORAL
  Filled 2021-01-06: qty 4

## 2021-01-06 NOTE — ED Triage Notes (Signed)
Pt c/o chest pain; pt states he was admitted last week for a heart attack and had stents placed; pt states he was discharged on the 8th but continues to have chest pain

## 2021-01-06 NOTE — Progress Notes (Deleted)
Benito Mccreedy D.[place]vik Verdon Phone: [phone]   Assessment and Plan:     There are no diagnoses linked to this encounter.  ***   Pertinent previous records reviewed include ***   Follow Up: ***     Subjective:    I, Moenique Parris, am serving as a Education administrator for Doctor Glennon Mac  Chief Complaint: chronic right pain   HPI:  11/19/20 Patient is a 50 year old male presenting with right shoulder pain that has been going on since 2020. Patient was seen by an orthopedist and right shoulder was injected, it was recommended that patient get an MRI and start PT. Patient had to decline due to cost. Patient had relief from the injection for awhile but has now been intermittently bothering him. Patient is having a hard time sleeping due to the pain, pain is worse with adduction. 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 Patient is trying ibuprofen daily 100mg  at the most. Patient was seen by PCP on 09/2620 and was prescribed Celebrex, to stop ibuprofen, take Tylenol and referred to sports med for evaluation. Last right shoulder X ray was 07/25/2018.    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 Pain in anterior aspect for about 6 months now. No acute injury to area. Denies any radiating symptoms. Hard to put on shirt, pull seatbelt across body. No relief with IBU or Celebrex.    12/24/20 Patient states that he just had PT today so his shoulder is pretty sore. Pain has not gotten worse and still in the same location. Patient states PT will make him sore for a couple days but will feel better after a couple days.   01/07/2021 Patient states    Relevant Historical Information: Hypertension, DM type II    Additional pertinent review of systems negative.   Current Outpatient Medications:    albuterol  (VENTOLIN HFA) 108 (90 Base) MCG/ACT inhaler, Inhale 2 puffs into the lungs every 6 (six) hours as needed for wheezing or shortness of breath. (Patient not taking: Reported on 12/31/2020), Disp: 8 g, Rfl: 1   amLODipine (NORVASC) 5 MG tablet, Take 1 tablet (5 mg total) by mouth daily., Disp: 30 tablet, Rfl: 11   aspirin 81 MG EC tablet, Take 1 tablet (81 mg total) by mouth daily. Swallow whole., Disp: 90 tablet, Rfl: 3   bisacodyl (DULCOLAX) 10 MG suppository, Place 1 suppository (10 mg total) rectally as needed for moderate constipation., Disp: 12 suppository, Rfl: 0   cyanocobalamin 1000 MCG tablet, Take 100 mcg by mouth daily., Disp: , Rfl:    dapagliflozin propanediol (FARXIGA) 10 MG TABS tablet, Take 1 tablet (10 mg total) by mouth daily., Disp: 30 tablet, Rfl: 11   glipiZIDE (GLUCOTROL XL) 5 MG 24 hr tablet, Take 2 tablets (10 mg total) by mouth daily with breakfast., Disp: 180 tablet, Rfl: 1   glucose blood (ONETOUCH VERIO) test strip, Test sugars twice daily, Disp: 50 each, Rfl: 6   lisinopril (ZESTRIL) 20 MG tablet, Take 1 tablet (20 mg total) by mouth daily., Disp: 30 tablet, Rfl: 11   metFORMIN (GLUCOPHAGE) 850 MG tablet, TAKE 1 TABLET(850 MG) BY MOUTH TWICE DAILY WITH A MEAL., Disp: 180 tablet, Rfl: 1   metoprolol tartrate (LOPRESSOR) 25 MG tablet, Take 1 tablet (25 mg total) by mouth 2 (  two) times daily., Disp: 60 tablet, Rfl: 11   nicotine (NICODERM CQ - DOSED IN MG/24 HOURS) 21 mg/24hr patch, Place 1 patch (21 mg total) onto the skin daily., Disp: 30 patch, Rfl: 2   nitroGLYCERIN (NITROSTAT) 0.4 MG SL tablet, Place 1 tablet (0.4 mg total) under the tongue every 5 (five) minutes as needed for chest pain., Disp: 25 tablet, Rfl: 1   ONETOUCH DELICA LANCETS 03T MISC, Test sugars twice daily, Disp: 50 each, Rfl: 3   pantoprazole (PROTONIX) 40 MG tablet, TAKE 1 TABLET(40 MG) BY MOUTH DAILY, Disp: 90 tablet, Rfl: 1   Plecanatide (TRULANCE) 3 MG TABS, Take 3 mg by mouth daily. (Patient not  taking: Reported on 12/31/2020), Disp: 30 tablet, Rfl: 5   polyethylene glycol (MIRALAX) 17 g packet, Take 17 g by mouth daily., Disp: 30 each, Rfl: 1   rosuvastatin (CRESTOR) 40 MG tablet, Take 1 tablet (40 mg total) by mouth daily., Disp: 90 tablet, Rfl: 3   ticagrelor (BRILINTA) 90 MG TABS tablet, Take 1 tablet (90 mg total) by mouth 2 (two) times daily., Disp: 60 tablet, Rfl: 11   Objective:     There were no vitals filed for this visit.    There is no height or weight on file to calculate BMI.    Physical Exam:    ***   Electronically signed by:  Benito Mccreedy D.[facility] 9:39 AM 01/06/21

## 2021-01-06 NOTE — Telephone Encounter (Signed)
Pt called with abd pain.  Mid abd - recently sen in ER 01/03/21 after discharge 12/5 post NSTEMI and stents.  He is on ASA and brilinta.  In ER his abd CT with stool an pt notes he is having BMs daily.  No chest pain.  No SOB.  I asked him to increase protonix to twice a day.  The Brilinta could be irritating his stomach lining.  I asked him to see his PCP back may need to see GI.  If pain severe he should return to ER.  Pt agreeable.

## 2021-01-06 NOTE — ED Provider Notes (Signed)
[facility] EMERGENCY DEPARTMENT Provider Note   CSN: [id number] Arrival date & time: 01/06/21  2204     History Chief Complaint  Patient presents with   Chest Pain    Jacob Baker is a 50 y.o. male.  Patient reports left-sided chest pain ever since his discharge from the hospital on December 8.  He received 2 stents.  He reports the pain is constant but waxes and wanes in severity.  He tried nitroglycerin without relief.  States the pain is the same as when he left the hospital is unchanged.  There are some associated shortness of breath.  The pain does not radiate to his arm, neck or back.  No nausea.  No fever.  He does have 1 episode of vomiting when he left the hospital but none since. Also complaining of diffuse abdominal pain since his discharge on the eighth.  Pain is constant.  Was seen in the ED 3 days ago and diagnosed with constipation.  States no improvement.  States moving his bowels normally and on every day basis.  No further vomiting.  No fever.  Abdominal pain is diffuse. No pain with urination or blood in the urine.  He states he is having a bowel movement every day and taking MiraLAX. States compliance with his medication   Underwent cardiac catheterization 12/6 with high-grade stenosis of large diagonal branch of 99% treated with PCI/DES x1.  Did have residual disease of 80 to 90% and circumflex.  Also 60% mid/distal disease of the PDA and LV branches. Placed on DAPT with aspirin/plavix post cath with recommendations for at least 1 year.  He continued to have recurrent chest pain post cath and went back to cath 12/7 with successful PCI of mRCA. Recommendations to transition from plavix to Brilinta  The history is provided by the patient.  Chest Pain Associated symptoms: abdominal pain, nausea and vomiting   Associated symptoms: no back pain, no dizziness, no fever, no headache, no shortness of breath and no weakness       Past Medical History:  Diagnosis Date    CHF (congestive heart failure) ([place])    Diabetes mellitus without complication ([place])    DVT (deep venous thrombosis) ([place]) 04/2017   GERD (gastroesophageal reflux disease)    History of migraine    none in over 2 yrs   Hyperlipidemia    Hypertension    Syncope and collapse    x1 - approx 2-3 yrs ago - dehydration    Patient Active Problem List   Diagnosis Date Noted   Status post coronary artery stent placement    Leukocytosis 12/31/2020   Hyperglycemia due to diabetes mellitus ([place]) 12/31/2020   Elevated troponin 12/31/2020   GERD (gastroesophageal reflux disease) 12/31/2020   NSTEMI (non-ST elevated myocardial infarction) ([place]) 12/31/2020   Chronic right shoulder pain 10/21/2020   Uncontrolled type 2 diabetes mellitus with hyperglycemia ([place]) 10/26/2018   Mixed hyperlipidemia 10/26/2018   Chronic idiopathic constipation    Change in bowel habits    Polyp of sigmoid colon    Rectal polyp    Chronic pain of right hip 10/15/2015   Neuropathy 07/24/2015   Essential hypertension 07/24/2015   Dyslipidemia with low high density lipoprotein (HDL) cholesterol with hypertriglyceridemia due to type 2 diabetes mellitus ([place]) 07/24/2015   Encounter for preventive health examination 10/26/2014   Pituitary insufficiency ([place]) 09/12/2014   Low testosterone 09/11/2014   Onychomycosis 04/18/2014   Hyperlipidemia associated with type 2 diabetes mellitus ([place]) 11/26/2013  Current smoker 11/24/2013   Hiatal hernia 11/15/2013   Tubular adenoma of colon 30/16/0109   History of Helicobacter pylori infection 10/18/2013   Gastritis due to nonsteroidal anti-inflammatory drug (NSAID) 10/14/2013   HTN (hypertension) 04/26/2013   Anxiety 04/26/2013   Chest pain 10/12/2011    Past Surgical History:  Procedure Laterality Date   COLONOSCOPY     COLONOSCOPY WITH PROPOFOL N/A 08/06/2017   Procedure: COLONOSCOPY WITH PROPOFOL;  Surgeon: Lucilla Lame, MD;  Location: [place];  Service:  Endoscopy;  Laterality: N/A;  Diabetic - oral meds   CORONARY STENT INTERVENTION N/A 12/31/2020   Procedure: CORONARY STENT INTERVENTION;  Surgeon: Belva Crome, MD;  Location: [place] [facility];  Service: Cardiovascular;  Laterality: N/A;   CORONARY STENT INTERVENTION N/A 01/01/2021   Procedure: CORONARY STENT INTERVENTION;  Surgeon: [name], MD;  Location: [place] [facility];  Service: Cardiovascular;  Laterality: N/A;   ESOPHAGEAL DILATION  09/24/2016   Procedure: ESOPHAGEAL DILATION;  Surgeon: Lucilla Lame, MD;  Location: [place];  Service: Gastroenterology;;   ESOPHAGOGASTRODUODENOSCOPY (EGD) WITH PROPOFOL N/A 04/08/2015   Procedure: ESOPHAGOGASTRODUODENOSCOPY (EGD) WITH PROPOFOL with dialation;  Surgeon: Lucilla Lame, MD;  Location: [place];  Service: Endoscopy;  Laterality: N/A;  diabetic - oral meds   ESOPHAGOGASTRODUODENOSCOPY (EGD) WITH PROPOFOL N/A 09/24/2016   Procedure: ESOPHAGOGASTRODUODENOSCOPY (EGD) WITH PROPOFOL WITH DILATION;  Surgeon: Lucilla Lame, MD;  Location: [place];  Service: Gastroenterology;  Laterality: N/A;  Diabetic - oral meds   FINGER AMPUTATION  2009   partial removal left index finger    LEFT HEART CATH AND CORONARY ANGIOGRAPHY N/A 12/31/2020   Procedure: LEFT HEART CATH AND CORONARY ANGIOGRAPHY;  Surgeon: Belva Crome, MD;  Location: [place] [facility];  Service: Cardiovascular;  Laterality: N/A;   LEFT HEART CATH AND CORONARY ANGIOGRAPHY N/A 01/01/2021   Procedure: LEFT HEART CATH AND CORONARY ANGIOGRAPHY;  Surgeon: [name], MD;  Location: [place] [facility];  Service: Cardiovascular;  Laterality: N/A;   POLYPECTOMY  08/06/2017   Procedure: POLYPECTOMY INTESTINAL;  Surgeon: Lucilla Lame, MD;  Location: [place];  Service: Endoscopy;;       Family History  Problem Relation Age of Onset   Heart disease Father        CAD   Hypertension Father    Diabetes Father    Hypertension Sister     Hypertension Brother    Hypertension Brother    Hypertension Mother     Social History   Tobacco Use   Smoking status: Every Day    Packs/day: 2.00    Years: 27.00    Pack years: 54.00    Types: Cigarettes   Smokeless tobacco: Never  Vaping Use   Vaping Use: Never used  Substance Use Topics   Alcohol use: No   Drug use: No    Home Medications Prior to Admission medications   Medication Sig Start Date End Date Taking? Authorizing Provider  albuterol (VENTOLIN HFA) 108 (90 Base) MCG/ACT inhaler Inhale 2 puffs into the lungs every 6 (six) hours as needed for wheezing or shortness of breath. Patient not taking: Reported on 12/31/2020 04/28/19   [name], MD  amLODipine (NORVASC) 5 MG tablet Take 1 tablet (5 mg total) by mouth daily. 01/02/21   Tobb, Kardie, DO  aspirin 81 MG EC tablet Take 1 tablet (81 mg total) by mouth daily. Swallow whole. 01/02/21   Tobb, Kardie, DO  bisacodyl (DULCOLAX) 10  MG suppository Place 1 suppository (10 mg total) rectally as needed for moderate constipation. 01/03/21   Maudie Flakes, MD  cyanocobalamin 1000 MCG tablet Take 100 mcg by mouth daily.    [provider]  dapagliflozin propanediol (FARXIGA) 10 MG TABS tablet Take 1 tablet (10 mg total) by mouth daily. 01/02/21   Tobb, Kardie, DO  glipiZIDE (GLUCOTROL XL) 5 MG 24 hr tablet Take 2 tablets (10 mg total) by mouth daily with breakfast. 08/03/20   [name], MD  glucose blood (ONETOUCH VERIO) test strip Test sugars twice daily 04/26/14   Phadke, Radhika P, MD  lisinopril (ZESTRIL) 20 MG tablet Take 1 tablet (20 mg total) by mouth daily. 01/02/21   Tobb, Kardie, DO  metFORMIN (GLUCOPHAGE) 850 MG tablet TAKE 1 TABLET(850 MG) BY MOUTH TWICE DAILY WITH A MEAL. 01/03/21   Tobb, Kardie, DO  metoprolol tartrate (LOPRESSOR) 25 MG tablet Take 1 tablet (25 mg total) by mouth 2 (two) times daily. 01/02/21   Tobb, Kardie, DO  nicotine (NICODERM CQ - DOSED IN MG/24 HOURS) 21 mg/24hr patch Place 1 patch  (21 mg total) onto the skin daily. 01/02/21 01/02/22  Tobb, Kardie, DO  nitroGLYCERIN (NITROSTAT) 0.4 MG SL tablet Place 1 tablet (0.4 mg total) under the tongue every 5 (five) minutes as needed for chest pain. 01/02/21 01/02/22  Tobb, Kardie, DO  ONETOUCH DELICA LANCETS 82U MISC Test sugars twice daily 04/26/14   Phadke, Radhika P, MD  pantoprazole (PROTONIX) 40 MG tablet TAKE 1 TABLET(40 MG) BY MOUTH DAILY 07/22/20   [name], MD  Plecanatide (TRULANCE) 3 MG TABS Take 3 mg by mouth daily. Patient not taking: Reported on 12/31/2020 05/20/20   Lucilla Lame, MD  polyethylene glycol (MIRALAX) 17 g packet Take 17 g by mouth daily. 01/03/21   Maudie Flakes, MD  rosuvastatin (CRESTOR) 40 MG tablet Take 1 tablet (40 mg total) by mouth daily. 01/02/21   Tobb, Kardie, DO  ticagrelor (BRILINTA) 90 MG TABS tablet Take 1 tablet (90 mg total) by mouth 2 (two) times daily. 01/02/21   Tobb, Godfrey Pick, DO    Allergies    Patient has no known allergies.  Review of Systems   Review of Systems  Constitutional:  Negative for activity change, appetite change and fever.  HENT:  Negative for congestion and rhinorrhea.   Respiratory:  Positive for chest tightness. Negative for shortness of breath.   Cardiovascular:  Positive for chest pain.  Gastrointestinal:  Positive for abdominal pain, nausea and vomiting.  Genitourinary:  Negative for dysuria and hematuria.  Musculoskeletal:  Negative for arthralgias, back pain and myalgias.  Skin:  Negative for rash.  Neurological:  Negative for dizziness, weakness and headaches.   all other systems are negative except as noted in the HPI and PMH.   Physical Exam Updated Vital Signs BP (!) 172/97 (BP Location: Right Arm)   Pulse 67   Temp 98.6 F (37 C)   Resp 18   Ht 6\' 1"  (1.854 m)   Wt 88.5 kg   SpO2 100%   BMI 25.73 kg/m   Physical Exam Vitals and nursing note reviewed.  Constitutional:      General: He is not in acute distress.    Appearance: He is  well-developed.  HENT:     Head: Normocephalic and atraumatic.     Mouth/Throat:     Pharynx: No oropharyngeal exudate.  Eyes:     Conjunctiva/sclera: Conjunctivae normal.     Pupils: Pupils  are equal, round, and reactive to light.  Neck:     Comments: No meningismus. Cardiovascular:     Rate and Rhythm: Normal rate and regular rhythm.     Heart sounds: Normal heart sounds. No murmur heard. Pulmonary:     Effort: Pulmonary effort is normal. No respiratory distress.     Breath sounds: Normal breath sounds.     Comments: Chest pain not reproducible Chest:     Chest wall: No tenderness.  Abdominal:     Palpations: Abdomen is soft.     Tenderness: There is abdominal tenderness. There is no guarding or rebound.     Comments: Diffuse tenderness. No guarding or rebound  Musculoskeletal:        General: No tenderness. Normal range of motion.     Cervical back: Normal range of motion and neck supple.  Skin:    General: Skin is warm.  Neurological:     General: No focal deficit present.     Mental Status: He is alert and oriented to person, place, and time. Mental status is at baseline.     Cranial Nerves: No cranial nerve deficit.     Motor: No abnormal muscle tone.     Coordination: Coordination normal.     Comments:  5/5 strength throughout. CN 2-12 intact.Equal grip strength.   Psychiatric:        Behavior: Behavior normal.    ED Results / Procedures / Treatments   Labs (all labs ordered are listed, but only abnormal results are displayed) Labs Reviewed  BASIC METABOLIC PANEL - Abnormal; Notable for the following components:      Result Value   Glucose, Bld 165 (*)    All other components within normal limits  CBC - Abnormal; Notable for the following components:   WBC 13.0 (*)    Hemoglobin 12.8 (*)    HCT 36.5 (*)    All other components within normal limits  D-DIMER, QUANTITATIVE - Abnormal; Notable for the following components:   D-Dimer, Quant 2.44 (*)    All  other components within normal limits  TROPONIN I (HIGH SENSITIVITY) - Abnormal; Notable for the following components:   Troponin I (High Sensitivity) 73 (*)    All other components within normal limits  TROPONIN I (HIGH SENSITIVITY) - Abnormal; Notable for the following components:   Troponin I (High Sensitivity) 68 (*)    All other components within normal limits  HEPATIC FUNCTION PANEL  LIPASE, BLOOD    EKG EKG Interpretation  Date/Time:  Tuesday January 07 2021 01:44:41 EST Ventricular Rate:  69 PR Interval:  225 QRS Duration: 90 QT Interval:  387 QTC Calculation: 415 R Axis:   3 Text Interpretation: Sinus rhythm Prolonged PR interval No significant change was found Confirmed by Ezequiel Essex [phone]) on 01/07/2021 1:48:08 AM  Radiology DG Chest 2 View  Result Date: 01/07/2021 CLINICAL DATA:  Chest pain. EXAM: CHEST - 2 VIEW COMPARISON:  Chest radiograph dated 12/30/2020. FINDINGS: No focal consolidation, pleural effusion, pneumothorax. The cardiac silhouette is within limits. No acute osseous pathology. IMPRESSION: No active cardiopulmonary disease. Electronically Signed   By: Anner Crete M.D.   On: 01/07/2021 00:02   CT Angio Chest PE W and/or Wo Contrast  Result Date: 01/07/2021 CLINICAL DATA:  Chest pain, status post heart attack with stent placement, upper abdominal pain. EXAM: CT ANGIOGRAPHY CHEST CT ABDOMEN AND PELVIS WITH CONTRAST TECHNIQUE: Multidetector CT imaging of the chest was performed using the standard protocol during bolus administration  of intravenous contrast. Multiplanar CT image reconstructions and MIPs were obtained to evaluate the vascular anatomy. Multidetector CT imaging of the abdomen and pelvis was performed using the standard protocol during bolus administration of intravenous contrast. CONTRAST:  17mL OMNIPAQUE IOHEXOL 350 MG/ML SOLN COMPARISON:  CT abdomen/pelvis dated 01/03/2021. CTA chest abdomen pelvis dated 12/30/2020. FINDINGS: CTA  CHEST FINDINGS Cardiovascular: Satisfactory opacification of the bilateral pulmonary arteries to the segmental level. No evidence of pulmonary embolism. Although not tailored for evaluation of the thoracic aorta, there is no evidence of thoracic aortic aneurysm or dissection. The heart is normal in size.  No pericardial effusion. Coronary stent in the 1st diagonal. Coronary atherosclerosis of the right coronary artery. Mediastinum/Nodes: No suspicious mediastinal lymphadenopathy. Visualized thyroid is unremarkable. Lungs/Pleura: Mild dependent atelectasis in the bilateral lower lobes. No focal consolidation. No suspicious pulmonary nodules. No pleural effusion or pneumothorax. Musculoskeletal: Degenerative changes of the thoracic spine. Review of the MIP images confirms the above findings. CT ABDOMEN and PELVIS FINDINGS Hepatobiliary: Liver is within normal limits. Gallbladder is unremarkable. No intrahepatic or extrahepatic ductal dilatation. Pancreas: Within normal limits. Spleen: Within normal limits. Adrenals/Urinary Tract: Adrenal glands are within normal limits. Subcentimeter right renal cysts. Left kidney is within normal limits. No hydronephrosis. Bladder is within normal limits. Stomach/Bowel: Stomach is within normal limits No evidence of bowel obstruction. Normal appendix (series 2/image 69). No colonic wall thickening or inflammatory changes. Vascular/Lymphatic: No evidence of abdominal aortic aneurysm. Atherosclerotic calcifications of the abdominal aorta and branch vessels. No suspicious abdominopelvic lymphadenopathy. Reproductive: Mild prostatomegaly. Other: No abdominopelvic ascites. Musculoskeletal: Visualized osseous structures are within normal limits. Review of the MIP images confirms the above findings. IMPRESSION: No evidence of pulmonary embolism. No acute findings in the chest, abdomen, or pelvis. No interval change from recent CTs. Electronically Signed   By: Julian Hy M.D.   On:  01/07/2021 01:14   CT ABDOMEN PELVIS W CONTRAST  Result Date: 01/07/2021 CLINICAL DATA:  Chest pain, status post heart attack with stent placement, upper abdominal pain. EXAM: CT ANGIOGRAPHY CHEST CT ABDOMEN AND PELVIS WITH CONTRAST TECHNIQUE: Multidetector CT imaging of the chest was performed using the standard protocol during bolus administration of intravenous contrast. Multiplanar CT image reconstructions and MIPs were obtained to evaluate the vascular anatomy. Multidetector CT imaging of the abdomen and pelvis was performed using the standard protocol during bolus administration of intravenous contrast. CONTRAST:  116mL OMNIPAQUE IOHEXOL 350 MG/ML SOLN COMPARISON:  CT abdomen/pelvis dated 01/03/2021. CTA chest abdomen pelvis dated 12/30/2020. FINDINGS: CTA CHEST FINDINGS Cardiovascular: Satisfactory opacification of the bilateral pulmonary arteries to the segmental level. No evidence of pulmonary embolism. Although not tailored for evaluation of the thoracic aorta, there is no evidence of thoracic aortic aneurysm or dissection. The heart is normal in size.  No pericardial effusion. Coronary stent in the 1st diagonal. Coronary atherosclerosis of the right coronary artery. Mediastinum/Nodes: No suspicious mediastinal lymphadenopathy. Visualized thyroid is unremarkable. Lungs/Pleura: Mild dependent atelectasis in the bilateral lower lobes. No focal consolidation. No suspicious pulmonary nodules. No pleural effusion or pneumothorax. Musculoskeletal: Degenerative changes of the thoracic spine. Review of the MIP images confirms the above findings. CT ABDOMEN and PELVIS FINDINGS Hepatobiliary: Liver is within normal limits. Gallbladder is unremarkable. No intrahepatic or extrahepatic ductal dilatation. Pancreas: Within normal limits. Spleen: Within normal limits. Adrenals/Urinary Tract: Adrenal glands are within normal limits. Subcentimeter right renal cysts. Left kidney is within normal limits. No  hydronephrosis. Bladder is within normal limits. Stomach/Bowel: Stomach is within normal limits No  evidence of bowel obstruction. Normal appendix (series 2/image 69). No colonic wall thickening or inflammatory changes. Vascular/Lymphatic: No evidence of abdominal aortic aneurysm. Atherosclerotic calcifications of the abdominal aorta and branch vessels. No suspicious abdominopelvic lymphadenopathy. Reproductive: Mild prostatomegaly. Other: No abdominopelvic ascites. Musculoskeletal: Visualized osseous structures are within normal limits. Review of the MIP images confirms the above findings. IMPRESSION: No evidence of pulmonary embolism. No acute findings in the chest, abdomen, or pelvis. No interval change from recent CTs. Electronically Signed   By: Julian Hy M.D.   On: 01/07/2021 01:14    Procedures Procedures   Medications Ordered in ED Medications  alum & mag hydroxide-simeth (MAALOX/MYLANTA) 200-200-20 MG/5ML suspension 30 mL (has no administration in time range)    And  lidocaine (XYLOCAINE) 2 % viscous mouth solution 15 mL (has no administration in time range)  aspirin chewable tablet 324 mg (has no administration in time range)  ondansetron (ZOFRAN) injection 4 mg (has no administration in time range)    ED Course  I have reviewed the triage vital signs and the nursing notes.  Pertinent labs & imaging results that were available during my care of the patient were reviewed by me and considered in my medical decision making (see chart for details).    MDM Rules/Calculators/A&P                           Ongoing left-sided chest pain.  Since stent placement last week.  EKG here without acute ischemia.  Diffuse abdominal pain without peritoneal signs.  CT scan 3 days ago did not show any acute pathology  Troponin today is 3 which is likely still downtrending from previous values.  D-dimer is elevated.  Given ongoing left-sided chest pain that is constant, CT scan obtained  to rule out pulmonary embolism.  This is negative for pulmonary embolism negative for aortic dissection.  No acute findings on CT abdomen pelvis.  D/w cardiology Dr. Clayton Bibles who reviewed patient's chart and catheterization reports.  He agrees low suspicion for in-stent stenosis.  Patient with residual disease and it would not be uncommon to have ongoing angina.  It is reassuring that troponins are downtrending.  He is quite hypertensive in the ED despite compliance with his medications. Dr. Clayton Bibles feels better blood pressure control might help his symptoms.  He recommends increasing amlodipine to 10 mg daily, lisinopril to 30 mg daily and imdur 30 mg.  Discussed with patient and his significant other.  They are hesitant to take additional blood pressure medications at this time as they were recently decreased while he was in the hospital.  They agreed to starting Imdur. Both are frustrated that he is still having pain.  Discussed reassuring work-up today and the concern that he could still have small vessel disease causing ischemia but has no evidence of new heart attack today or stent occlusion.  Discussed he still has coronary disease but no evidence of heart attack today. it is possible that he could still be having pain due to small vessel disease. No other evident explanation for his pain.  No evidence of pulmonary embolism or aortic dissection.  No pneumonia.  No pneumothorax.  Could consider GI etiology of pain. Discussed that he should follow-up with her cardiologist sooner than his scheduled on December 28. Troponins continue to downtrend today.  Discussed better blood pressure control to help controlling his pain. Return to the ED with exertional chest pain, pain associated shortness of breath, nausea,  vomiting, sweating or any other concerns. Final Clinical Impression(s) / ED Diagnoses Final diagnoses:  Chest pain, unspecified type    Rx / DC Orders ED Discharge Orders     None         Jorden Mahl, Annie Main, MD 01/07/21 [id number]

## 2021-01-06 NOTE — Telephone Encounter (Signed)
Pt has appt w/ C. Sharolyn Douglas 01/22/21

## 2021-01-07 ENCOUNTER — Other Ambulatory Visit: Payer: Self-pay

## 2021-01-07 ENCOUNTER — Emergency Department (HOSPITAL_COMMUNITY): Payer: BC Managed Care – PPO

## 2021-01-07 ENCOUNTER — Ambulatory Visit: Payer: BC Managed Care – PPO | Admitting: Sports Medicine

## 2021-01-07 ENCOUNTER — Inpatient Hospital Stay (HOSPITAL_COMMUNITY)
Admission: EM | Admit: 2021-01-07 | Discharge: 2021-01-13 | DRG: 446 | Disposition: A | Discharge status: Home / Self Care | Payer: BC Managed Care – PPO | Attending: Family Medicine | Admitting: Family Medicine

## 2021-01-07 ENCOUNTER — Encounter (HOSPITAL_COMMUNITY): Payer: Self-pay

## 2021-01-07 ENCOUNTER — Ambulatory Visit (HOSPITAL_COMMUNITY): Payer: BC Managed Care – PPO

## 2021-01-07 DIAGNOSIS — Z8719 Personal history of other diseases of the digestive system: Secondary | ICD-10-CM | POA: Diagnosis present

## 2021-01-07 DIAGNOSIS — E1165 Type 2 diabetes mellitus with hyperglycemia: Secondary | ICD-10-CM | POA: Diagnosis not present

## 2021-01-07 DIAGNOSIS — Z8249 Family history of ischemic heart disease and other diseases of the circulatory system: Secondary | ICD-10-CM

## 2021-01-07 DIAGNOSIS — R109 Unspecified abdominal pain: Secondary | ICD-10-CM | POA: Diagnosis not present

## 2021-01-07 DIAGNOSIS — R1084 Generalized abdominal pain: Secondary | ICD-10-CM

## 2021-01-07 DIAGNOSIS — Z7984 Long term (current) use of oral hypoglycemic drugs: Secondary | ICD-10-CM

## 2021-01-07 DIAGNOSIS — K828 Other specified diseases of gallbladder: Secondary | ICD-10-CM | POA: Diagnosis not present

## 2021-01-07 DIAGNOSIS — Z89022 Acquired absence of left finger(s): Secondary | ICD-10-CM

## 2021-01-07 DIAGNOSIS — Z86718 Personal history of other venous thrombosis and embolism: Secondary | ICD-10-CM

## 2021-01-07 DIAGNOSIS — I110 Hypertensive heart disease with heart failure: Secondary | ICD-10-CM | POA: Diagnosis present

## 2021-01-07 DIAGNOSIS — E876 Hypokalemia: Secondary | ICD-10-CM | POA: Diagnosis present

## 2021-01-07 DIAGNOSIS — R1011 Right upper quadrant pain: Secondary | ICD-10-CM | POA: Diagnosis present

## 2021-01-07 DIAGNOSIS — K219 Gastro-esophageal reflux disease without esophagitis: Secondary | ICD-10-CM | POA: Diagnosis present

## 2021-01-07 DIAGNOSIS — I10 Essential (primary) hypertension: Secondary | ICD-10-CM

## 2021-01-07 DIAGNOSIS — Z20822 Contact with and (suspected) exposure to covid-19: Secondary | ICD-10-CM | POA: Diagnosis present

## 2021-01-07 DIAGNOSIS — F17210 Nicotine dependence, cigarettes, uncomplicated: Secondary | ICD-10-CM | POA: Diagnosis present

## 2021-01-07 DIAGNOSIS — Z7902 Long term (current) use of antithrombotics/antiplatelets: Secondary | ICD-10-CM

## 2021-01-07 DIAGNOSIS — I2510 Atherosclerotic heart disease of native coronary artery without angina pectoris: Secondary | ICD-10-CM | POA: Diagnosis present

## 2021-01-07 DIAGNOSIS — R7989 Other specified abnormal findings of blood chemistry: Secondary | ICD-10-CM | POA: Diagnosis present

## 2021-01-07 DIAGNOSIS — Z955 Presence of coronary angioplasty implant and graft: Secondary | ICD-10-CM

## 2021-01-07 DIAGNOSIS — R110 Nausea: Secondary | ICD-10-CM | POA: Diagnosis present

## 2021-01-07 DIAGNOSIS — Z7982 Long term (current) use of aspirin: Secondary | ICD-10-CM

## 2021-01-07 DIAGNOSIS — Z79899 Other long term (current) drug therapy: Secondary | ICD-10-CM

## 2021-01-07 DIAGNOSIS — I509 Heart failure, unspecified: Secondary | ICD-10-CM | POA: Diagnosis present

## 2021-01-07 DIAGNOSIS — D649 Anemia, unspecified: Secondary | ICD-10-CM | POA: Diagnosis present

## 2021-01-07 DIAGNOSIS — E785 Hyperlipidemia, unspecified: Secondary | ICD-10-CM | POA: Diagnosis present

## 2021-01-07 DIAGNOSIS — K810 Acute cholecystitis: Principal | ICD-10-CM | POA: Diagnosis present

## 2021-01-07 DIAGNOSIS — I252 Old myocardial infarction: Secondary | ICD-10-CM

## 2021-01-07 DIAGNOSIS — R1013 Epigastric pain: Secondary | ICD-10-CM

## 2021-01-07 LAB — CBC
HCT: 37.2 % — ABNORMAL LOW (ref 39.0–52.0)
Hemoglobin: 13.1 g/dL (ref 13.0–17.0)
MCH: 29.1 pg (ref 26.0–34.0)
MCHC: 35.2 g/dL (ref 30.0–36.0)
MCV: 82.7 fL (ref 80.0–100.0)
Platelets: 265 10*3/uL (ref 150–400)
RBC: 4.5 MIL/uL (ref 4.22–5.81)
RDW: 13.2 % (ref 11.5–15.5)
WBC: 19 10*3/uL — ABNORMAL HIGH (ref 4.0–10.5)
nRBC: 0 % (ref 0.0–0.2)

## 2021-01-07 LAB — HEPATIC FUNCTION PANEL
ALT: 30 U/L (ref 0–44)
ALT: 75 U/L — ABNORMAL HIGH (ref 0–44)
AST: 15 U/L (ref 15–41)
AST: 36 U/L (ref 15–41)
Albumin: 4.1 g/dL (ref 3.5–5.0)
Albumin: 4.3 g/dL (ref 3.5–5.0)
Alkaline Phosphatase: 119 U/L (ref 38–126)
Alkaline Phosphatase: 91 U/L (ref 38–126)
Bilirubin, Direct: 0.1 mg/dL (ref 0.0–0.2)
Bilirubin, Direct: 0.5 mg/dL — ABNORMAL HIGH (ref 0.0–0.2)
Indirect Bilirubin: 0.7 mg/dL (ref 0.3–0.9)
Indirect Bilirubin: 1.1 mg/dL — ABNORMAL HIGH (ref 0.3–0.9)
Total Bilirubin: 0.8 mg/dL (ref 0.3–1.2)
Total Bilirubin: 1.6 mg/dL — ABNORMAL HIGH (ref 0.3–1.2)
Total Protein: 7.1 g/dL (ref 6.5–8.1)
Total Protein: 7.2 g/dL (ref 6.5–8.1)

## 2021-01-07 LAB — BASIC METABOLIC PANEL
Anion gap: 12 (ref 5–15)
Anion gap: 9 (ref 5–15)
BUN: 11 mg/dL (ref 6–20)
BUN: 12 mg/dL (ref 6–20)
CO2: 22 mmol/L (ref 22–32)
CO2: 23 mmol/L (ref 22–32)
Calcium: 8.9 mg/dL (ref 8.9–10.3)
Calcium: 9.3 mg/dL (ref 8.9–10.3)
Chloride: 102 mmol/L (ref 98–111)
Chloride: 106 mmol/L (ref 98–111)
Creatinine, Ser: 0.88 mg/dL (ref 0.61–1.24)
Creatinine, Ser: 0.95 mg/dL (ref 0.61–1.24)
GFR, Estimated: >60 mL/min (ref >60)
GFR, Estimated: >60 mL/min (ref >60)
Glucose, Bld: 165 mg/dL — ABNORMAL HIGH (ref 70–99)
Glucose, Bld: 224 mg/dL — ABNORMAL HIGH (ref 70–99)
Potassium: 3.6 mmol/L (ref 3.5–5.1)
Potassium: 3.9 mmol/L (ref 3.5–5.1)
Sodium: 137 mmol/L (ref 135–145)
Sodium: 137 mmol/L (ref 135–145)

## 2021-01-07 LAB — GLUCOSE, CAPILLARY: Glucose-Capillary: 230 mg/dL — ABNORMAL HIGH (ref 70–99)

## 2021-01-07 LAB — LIPASE, BLOOD
Lipase: 26 U/L (ref 11–51)
Lipase: 50 U/L (ref 11–51)

## 2021-01-07 LAB — TROPONIN I (HIGH SENSITIVITY)
Troponin I (High Sensitivity): 37 ng/L — ABNORMAL HIGH (ref <18)
Troponin I (High Sensitivity): 46 ng/L — ABNORMAL HIGH (ref <18)
Troponin I (High Sensitivity): 68 ng/L — ABNORMAL HIGH (ref <18)
Troponin I (High Sensitivity): 73 ng/L — ABNORMAL HIGH (ref <18)

## 2021-01-07 LAB — BRAIN NATRIURETIC PEPTIDE: B Natriuretic Peptide: 66 pg/mL (ref 0.0–100.0)

## 2021-01-07 MED ORDER — ONDANSETRON HCL 4 MG PO TABS
4.0000 mg | ORAL_TABLET | Freq: Four times a day (QID) | ORAL | Status: DC | PRN
  Filled 2021-01-07: qty 1

## 2021-01-07 MED ORDER — PANTOPRAZOLE SODIUM 40 MG IV SOLR
40.0000 mg | Freq: Every day | INTRAVENOUS | Status: DC
  Administered 2021-01-07: 40 mg via INTRAVENOUS
  Filled 2021-01-07: qty 40

## 2021-01-07 MED ORDER — ISOSORBIDE MONONITRATE ER 30 MG PO TB24: 30.0000 mg | ORAL_TABLET | Freq: Every day | ORAL | 0 refills | Status: DC

## 2021-01-07 MED ORDER — NITROGLYCERIN 0.4 MG SL SUBL
0.4000 mg | SUBLINGUAL_TABLET | Freq: Once | SUBLINGUAL | Status: AC
  Administered 2021-01-07: 0.4 mg via SUBLINGUAL
  Filled 2021-01-07: qty 1

## 2021-01-07 MED ORDER — LISINOPRIL 10 MG PO TABS
10.0000 mg | ORAL_TABLET | Freq: Once | ORAL | Status: DC
  Filled 2021-01-07: qty 1

## 2021-01-07 MED ORDER — ISOSORBIDE MONONITRATE ER 60 MG PO TB24
30.0000 mg | ORAL_TABLET | Freq: Every day | ORAL | Status: DC
  Administered 2021-01-08 – 2021-01-13 (×6): 30 mg via ORAL
  Filled 2021-01-07 (×6): qty 1

## 2021-01-07 MED ORDER — ACETAMINOPHEN 650 MG RE SUPP: 650.0000 mg | Freq: Four times a day (QID) | RECTAL | Status: DC | PRN

## 2021-01-07 MED ORDER — MORPHINE SULFATE (PF) 4 MG/ML IV SOLN
4.0000 mg | Freq: Once | INTRAVENOUS | Status: AC
  Administered 2021-01-07: 4 mg via INTRAVENOUS
  Filled 2021-01-07: qty 1

## 2021-01-07 MED ORDER — SODIUM CHLORIDE 0.9 % IV BOLUS
1000.0000 mL | Freq: Once | INTRAVENOUS | Status: AC
  Administered 2021-01-07: 1000 mL via INTRAVENOUS

## 2021-01-07 MED ORDER — AMLODIPINE BESYLATE 5 MG PO TABS
5.0000 mg | ORAL_TABLET | Freq: Once | ORAL | Status: DC
  Filled 2021-01-07: qty 1

## 2021-01-07 MED ORDER — KETOROLAC TROMETHAMINE 30 MG/ML IJ SOLN
30.0000 mg | Freq: Once | INTRAMUSCULAR | Status: AC
  Administered 2021-01-07: 30 mg via INTRAVENOUS
  Filled 2021-01-07: qty 1

## 2021-01-07 MED ORDER — AMLODIPINE BESYLATE 5 MG PO TABS
5.0000 mg | ORAL_TABLET | Freq: Every day | ORAL | Status: DC
  Administered 2021-01-08 – 2021-01-13 (×6): 5 mg via ORAL
  Filled 2021-01-07 (×6): qty 1

## 2021-01-07 MED ORDER — LISINOPRIL 10 MG PO TABS
20.0000 mg | ORAL_TABLET | Freq: Every day | ORAL | Status: DC
  Administered 2021-01-08 – 2021-01-13 (×6): 20 mg via ORAL
  Filled 2021-01-07 (×6): qty 2

## 2021-01-07 MED ORDER — ASPIRIN EC 81 MG PO TBEC
81.0000 mg | DELAYED_RELEASE_TABLET | Freq: Every day | ORAL | Status: DC
  Administered 2021-01-08 – 2021-01-13 (×6): 81 mg via ORAL
  Filled 2021-01-07 (×6): qty 1

## 2021-01-07 MED ORDER — POLYETHYLENE GLYCOL 3350 17 G PO PACK: 17.0000 g | PACK | Freq: Every day | ORAL | Status: DC | PRN

## 2021-01-07 MED ORDER — INSULIN ASPART 100 UNIT/ML IJ SOLN
0.0000 [IU] | INTRAMUSCULAR | Status: DC
  Administered 2021-01-07: 3 [IU] via SUBCUTANEOUS
  Administered 2021-01-08: 17:00:00 2 [IU] via SUBCUTANEOUS
  Administered 2021-01-08: 09:00:00 3 [IU] via SUBCUTANEOUS
  Administered 2021-01-08: 05:00:00 2 [IU] via SUBCUTANEOUS
  Administered 2021-01-08: 3 [IU] via SUBCUTANEOUS

## 2021-01-07 MED ORDER — FAMOTIDINE IN NACL 20-0.9 MG/50ML-% IV SOLN
20.0000 mg | Freq: Once | INTRAVENOUS | Status: AC
  Administered 2021-01-07: 20 mg via INTRAVENOUS
  Filled 2021-01-07: qty 50

## 2021-01-07 MED ORDER — TICAGRELOR 90 MG PO TABS
90.0000 mg | ORAL_TABLET | Freq: Two times a day (BID) | ORAL | Status: DC
  Administered 2021-01-07 – 2021-01-13 (×12): 90 mg via ORAL
  Filled 2021-01-07 (×12): qty 1

## 2021-01-07 MED ORDER — HEPARIN SODIUM (PORCINE) 5000 UNIT/ML IJ SOLN
5000.0000 [IU] | Freq: Three times a day (TID) | INTRAMUSCULAR | Status: DC
  Administered 2021-01-07 – 2021-01-13 (×16): 5000 [IU] via SUBCUTANEOUS
  Filled 2021-01-07 (×17): qty 1

## 2021-01-07 MED ORDER — MORPHINE SULFATE (PF) 4 MG/ML IV SOLN: 4.0000 mg | Freq: Once | INTRAVENOUS | Status: DC

## 2021-01-07 MED ORDER — HYDROMORPHONE HCL 1 MG/ML IJ SOLN
1.0000 mg | Freq: Four times a day (QID) | INTRAMUSCULAR | Status: DC | PRN
  Administered 2021-01-07 – 2021-01-09 (×4): 1 mg via INTRAVENOUS
  Filled 2021-01-07 (×4): qty 1

## 2021-01-07 MED ORDER — ACETAMINOPHEN 325 MG PO TABS
650.0000 mg | ORAL_TABLET | Freq: Four times a day (QID) | ORAL | Status: DC | PRN
  Administered 2021-01-08 – 2021-01-09 (×3): 650 mg via ORAL
  Filled 2021-01-07 (×4): qty 2

## 2021-01-07 MED ORDER — LABETALOL HCL 5 MG/ML IV SOLN: 10.0000 mg | INTRAVENOUS | Status: DC | PRN

## 2021-01-07 MED ORDER — METOPROLOL TARTRATE 25 MG PO TABS
25.0000 mg | ORAL_TABLET | Freq: Two times a day (BID) | ORAL | Status: DC
  Administered 2021-01-07 – 2021-01-12 (×10): 25 mg via ORAL
  Filled 2021-01-07 (×10): qty 1

## 2021-01-07 MED ORDER — ONDANSETRON HCL 4 MG/2ML IJ SOLN
4.0000 mg | Freq: Four times a day (QID) | INTRAMUSCULAR | Status: DC | PRN
  Administered 2021-01-07: 4 mg via INTRAVENOUS
  Filled 2021-01-07 (×2): qty 2

## 2021-01-07 MED ORDER — LABETALOL HCL 5 MG/ML IV SOLN
10.0000 mg | Freq: Once | INTRAVENOUS | Status: AC
  Administered 2021-01-07: 10 mg via INTRAVENOUS
  Filled 2021-01-07: qty 4

## 2021-01-07 MED ORDER — SODIUM CHLORIDE 0.9 % IV SOLN: INTRAVENOUS | Status: AC

## 2021-01-07 MED ORDER — IOHEXOL 350 MG/ML SOLN
100.0000 mL | Freq: Once | INTRAVENOUS | Status: AC | PRN
  Administered 2021-01-07: 100 mL via INTRAVENOUS

## 2021-01-07 NOTE — H&P (Signed)
History and Physical    RANDEE UPCHURCH DGL:[id number] DOB: 01-31-1970 DOA: 01/07/2021  PCP: [name], MD   Patient coming from: Home  I have personally briefly reviewed patient's old medical records in [place]  Chief Complaint: Abdominal pain  HPI: RUSTIN ERHART is a 50 y.o. male with medical history significant for  CAD, HTN, DM. Presented to the ED with persistent abdominal pain that started after he was hospitalized for NSTEMI and underwent cath 2ce- 12/6 and 12/7.  No vomiting, no loose stool.  Reports abdominal pain is worsened in severity.  Pain is mostly in his upper abdomen.  He denies black stool.  No blood in stools.  Severity of pain is not affected by food intake.  This pain is different from the recent pain he had with his heart attack, he'd had chest pains then and that has resolved. Reports NSAID use 800mg  about 3 times a week on average for headaches.  This is patient's third visit to the ED in the past 5 days for abdominal pain.  His last visit to the ED was yesterday 12/12, work-up included CT abdomen and pelvis with contrast, CTA to rule out PE contrast which were unremarkable.  Troponins were downtrending from recent hospitalization.  ED provider talked to cardiologist on-call Dr. Clayton Bibles, low suspicion for in-stent stenosis, patient has residual disease and it would not be uncommon to have ongoing angina, but reassuring that his troponins are downtrending and recommended better control of patient's blood pressure.  ED Course: Blood pressure elevated systolic up to 518A.  Temperature 98.1.  Heart rate 71-95.  Respiratory rate 14-21.  O2 sats 100% on room air.  Yesterday continued to downtrend from 46 > 37.  Lipase normal 26.  Slight ALT elevation at 75, T bili 1.6. Imaging today at included 2 view chest x-ray which was unremarkable, right upper quadrant ultrasound-gallbladder wall thickening with dependent sludge and probable tiny stones.  No Pericholecystic  fluid or sonographic Murphy sign. IV morphine 4 mg given without improvement in patient's pain.  Hospitalist admit.  Review of Systems: As per HPI all other systems reviewed and negative.  Past Medical History:  Diagnosis Date   CHF (congestive heart failure) (HCC)    Diabetes mellitus without complication ([place])    DVT (deep venous thrombosis) ([place]) 04/2017   GERD (gastroesophageal reflux disease)    History of migraine    none in over 2 yrs   Hyperlipidemia    Hypertension    Syncope and collapse    x1 - approx 2-3 yrs ago - dehydration    Past Surgical History:  Procedure Laterality Date   COLONOSCOPY     COLONOSCOPY WITH PROPOFOL N/A 08/06/2017   Procedure: COLONOSCOPY WITH PROPOFOL;  Surgeon: Lucilla Lame, MD;  Location: [place];  Service: Endoscopy;  Laterality: N/A;  Diabetic - oral meds   CORONARY STENT INTERVENTION N/A 12/31/2020   Procedure: CORONARY STENT INTERVENTION;  Surgeon: Belva Crome, MD;  Location: [place] [facility];  Service: Cardiovascular;  Laterality: N/A;   CORONARY STENT INTERVENTION N/A 01/01/2021   Procedure: CORONARY STENT INTERVENTION;  Surgeon: [name], MD;  Location: [place] [facility];  Service: Cardiovascular;  Laterality: N/A;   ESOPHAGEAL DILATION  09/24/2016   Procedure: ESOPHAGEAL DILATION;  Surgeon: Lucilla Lame, MD;  Location: [place];  Service: Gastroenterology;;   ESOPHAGOGASTRODUODENOSCOPY (EGD) WITH PROPOFOL N/A 04/08/2015   Procedure: ESOPHAGOGASTRODUODENOSCOPY (EGD) WITH PROPOFOL with dialation;  Surgeon: Lucilla Lame,  MD;  Location: [place];  Service: Endoscopy;  Laterality: N/A;  diabetic - oral meds   ESOPHAGOGASTRODUODENOSCOPY (EGD) WITH PROPOFOL N/A 09/24/2016   Procedure: ESOPHAGOGASTRODUODENOSCOPY (EGD) WITH PROPOFOL WITH DILATION;  Surgeon: Lucilla Lame, MD;  Location: [place];  Service: Gastroenterology;  Laterality: N/A;  Diabetic - oral meds   FINGER AMPUTATION  2009    partial removal left index finger    LEFT HEART CATH AND CORONARY ANGIOGRAPHY N/A 12/31/2020   Procedure: LEFT HEART CATH AND CORONARY ANGIOGRAPHY;  Surgeon: Belva Crome, MD;  Location: [place] [facility];  Service: Cardiovascular;  Laterality: N/A;   LEFT HEART CATH AND CORONARY ANGIOGRAPHY N/A 01/01/2021   Procedure: LEFT HEART CATH AND CORONARY ANGIOGRAPHY;  Surgeon: [name], MD;  Location: [place] [facility];  Service: Cardiovascular;  Laterality: N/A;   POLYPECTOMY  08/06/2017   Procedure: POLYPECTOMY INTESTINAL;  Surgeon: Lucilla Lame, MD;  Location: [place];  Service: Endoscopy;;     reports that he has been smoking cigarettes. He has a 54.00 pack-year smoking history. He has never used smokeless tobacco. He reports that he does not drink alcohol and does not use drugs.  No Known Allergies  Family History  Problem Relation Age of Onset   Heart disease Father        CAD   Hypertension Father    Diabetes Father    Hypertension Sister    Hypertension Brother    Hypertension Brother    Hypertension Mother     Prior to Admission medications   Medication Sig Start Date End Date Taking? Authorizing Provider  amLODipine (NORVASC) 5 MG tablet Take 1 tablet (5 mg total) by mouth daily. 01/02/21  Yes Tobb, Kardie, DO  aspirin 81 MG EC tablet Take 1 tablet (81 mg total) by mouth daily. Swallow whole. 01/02/21  Yes Tobb, Kardie, DO  bisacodyl (DULCOLAX) 10 MG suppository Place 1 suppository (10 mg total) rectally as needed for moderate constipation. 01/03/21  Yes Maudie Flakes, MD  cyanocobalamin 1000 MCG tablet Take 100 mcg by mouth daily.   Yes [provider]  dapagliflozin propanediol (FARXIGA) 10 MG TABS tablet Take 1 tablet (10 mg total) by mouth daily. 01/02/21  Yes Tobb, Kardie, DO  glipiZIDE (GLUCOTROL XL) 5 MG 24 hr tablet Take 2 tablets (10 mg total) by mouth daily with breakfast. 08/03/20  Yes [name], MD  glucose blood (ONETOUCH VERIO)  test strip Test sugars twice daily 04/26/14  Yes Phadke, Radhika P, MD  isosorbide mononitrate (IMDUR) 30 MG 24 hr tablet Take 1 tablet (30 mg total) by mouth daily. 01/07/21  Yes Rancour, Annie Main, MD  lisinopril (ZESTRIL) 20 MG tablet Take 1 tablet (20 mg total) by mouth daily. 01/02/21  Yes Tobb, Kardie, DO  metFORMIN (GLUCOPHAGE) 850 MG tablet TAKE 1 TABLET(850 MG) BY MOUTH TWICE DAILY WITH A MEAL. 01/03/21  Yes Tobb, Kardie, DO  metoprolol tartrate (LOPRESSOR) 25 MG tablet Take 1 tablet (25 mg total) by mouth 2 (two) times daily. 01/02/21  Yes Tobb, Kardie, DO  nicotine (NICODERM CQ - DOSED IN MG/24 HOURS) 21 mg/24hr patch Place 1 patch (21 mg total) onto the skin daily. 01/02/21 01/02/22 Yes Tobb, Kardie, DO  nitroGLYCERIN (NITROSTAT) 0.4 MG SL tablet Place 1 tablet (0.4 mg total) under the tongue every 5 (five) minutes as needed for chest pain. 01/02/21 01/02/22 Yes Tobb, Kardie, DO  ONETOUCH DELICA LANCETS 53G MISC Test sugars twice daily 04/26/14  Yes Phadke, Radhika  P, MD  pantoprazole (PROTONIX) 40 MG tablet TAKE 1 TABLET(40 MG) BY MOUTH DAILY 07/22/20  Yes [name], MD  polyethylene glycol (MIRALAX) 17 g packet Take 17 g by mouth daily. 01/03/21  Yes Maudie Flakes, MD  rosuvastatin (CRESTOR) 40 MG tablet Take 1 tablet (40 mg total) by mouth daily. 01/02/21  Yes Tobb, Kardie, DO  ticagrelor (BRILINTA) 90 MG TABS tablet Take 1 tablet (90 mg total) by mouth 2 (two) times daily. 01/02/21  Yes Tobb, Kardie, DO  albuterol (VENTOLIN HFA) 108 (90 Base) MCG/ACT inhaler Inhale 2 puffs into the lungs every 6 (six) hours as needed for wheezing or shortness of breath. Patient not taking: Reported on 12/31/2020 04/28/19   [name], MD  Plecanatide (TRULANCE) 3 MG TABS Take 3 mg by mouth daily. Patient not taking: Reported on 12/31/2020 05/20/20   Lucilla Lame, MD    Physical Exam: Vitals:   01/07/21 1230 01/07/21 1406 01/07/21 1430 01/07/21 1500  BP: (!) 171/91 (!) 179/98 (!) 193/100 (!) 171/75   Pulse: 79 95 87 82  Resp: 16 (!) 21 15 17   Temp:      TempSrc:      SpO2: 100% 100% 100% 100%  Weight:      Height:        Constitutional: Appears uncomfortable due to abdominal pain Vitals:   01/07/21 1230 01/07/21 1406 01/07/21 1430 01/07/21 1500  BP: (!) 171/91 (!) 179/98 (!) 193/100 (!) 171/75  Pulse: 79 95 87 82  Resp: 16 (!) 21 15 17   Temp:      TempSrc:      SpO2: 100% 100% 100% 100%  Weight:      Height:       Eyes: PERRL, lids and conjunctivae normal ENMT: Mucous membranes are dry.  Neck: normal, supple, no masses, no thyromegaly Respiratory: clear to auscultation bilaterally, no wheezing, no crackles. Normal respiratory effort. No accessory muscle use.  Cardiovascular: Regular rate and rhythm, no murmurs / rubs / gallops. No extremity edema.  Warm and well-perfused. Abdomen: Marked epigastric tenderness, no masses palpated. No hepatosplenomegaly..  Musculoskeletal: no clubbing / cyanosis. No joint deformity upper and lower extremities.   Skin: no rashes, lesions, ulcers. No induration Neurologic: No apparent cranial nerve abnormality, moving all extremities spontaneously.Marland Kitchen  Psychiatric: Normal judgment and insight. Alert and oriented x 3. Normal mood.   Labs on Admission: I have personally reviewed following labs and imaging studies  CBC: Recent Labs  Lab 01/01/21 0406 01/02/21 0126 01/03/21 0542 01/06/21 2331 01/07/21 1142  WBC 9.9 10.5 13.5* 13.0* 19.0*  HGB 13.0 12.2* 13.0 12.8* 13.1  HCT 36.3* 35.1* 36.5* 36.5* 37.2*  MCV 78.9* 80.9 82.8 82.8 82.7  PLT 239 243 222 253 283   Basic Metabolic Panel: Recent Labs  Lab 01/01/21 0406 01/02/21 0126 01/03/21 0542 01/06/21 2331 01/07/21 1142  NA 136 135 136 137 137  K 3.6 3.5 3.7 3.6 3.9  CL 103 104 105 106 102  CO2 25 24 23 22 23   GLUCOSE 236* 168* 175* 165* 224*  BUN 13 14 23* 12 11  CREATININE 1.03 0.83 0.99 0.95 0.88  CALCIUM 8.7* 8.9 8.5* 8.9 9.3   Liver Function Tests: Recent Labs  Lab  01/03/21 0542 01/06/21 2331 01/07/21 1316  AST 16 15 36  ALT 17 30 75*  ALKPHOS 91 91 119  BILITOT 1.0 0.8 1.6*  PROT 6.8 7.1 7.2  ALBUMIN 4.0 4.1 4.3   Recent Labs  Lab 01/03/21 0542  01/06/21 2331 01/07/21 1316  LIPASE 48 50 26   CBG: Recent Labs  Lab 01/01/21 1152 01/01/21 1606 01/01/21 2143 01/02/21 0814 01/02/21 1013  GLUCAP 216* 175* 156* 184* 240*    Radiological Exams on Admission: DG Chest 2 View  Result Date: 01/07/2021 CLINICAL DATA:  Chest pain and shortness of breath. EXAM: CHEST - 2 VIEW COMPARISON:  CT chest from same day.  Chest x-ray from yesterday. FINDINGS: The heart size and mediastinal contours are within normal limits. Both lungs are clear. The visualized skeletal structures are unremarkable. IMPRESSION: No active cardiopulmonary disease. Electronically Signed   By: Titus Dubin M.D.   On: 01/07/2021 12:18   DG Chest 2 View  Result Date: 01/07/2021 CLINICAL DATA:  Chest pain. EXAM: CHEST - 2 VIEW COMPARISON:  Chest radiograph dated 12/30/2020. FINDINGS: No focal consolidation, pleural effusion, pneumothorax. The cardiac silhouette is within limits. No acute osseous pathology. IMPRESSION: No active cardiopulmonary disease. Electronically Signed   By: Anner Crete M.D.   On: 01/07/2021 00:02   CT Angio Chest PE W and/or Wo Contrast  Result Date: 01/07/2021 CLINICAL DATA:  Chest pain, status post heart attack with stent placement, upper abdominal pain. EXAM: CT ANGIOGRAPHY CHEST CT ABDOMEN AND PELVIS WITH CONTRAST TECHNIQUE: Multidetector CT imaging of the chest was performed using the standard protocol during bolus administration of intravenous contrast. Multiplanar CT image reconstructions and MIPs were obtained to evaluate the vascular anatomy. Multidetector CT imaging of the abdomen and pelvis was performed using the standard protocol during bolus administration of intravenous contrast. CONTRAST:  142mL OMNIPAQUE IOHEXOL 350 MG/ML SOLN  COMPARISON:  CT abdomen/pelvis dated 01/03/2021. CTA chest abdomen pelvis dated 12/30/2020. FINDINGS: CTA CHEST FINDINGS Cardiovascular: Satisfactory opacification of the bilateral pulmonary arteries to the segmental level. No evidence of pulmonary embolism. Although not tailored for evaluation of the thoracic aorta, there is no evidence of thoracic aortic aneurysm or dissection. The heart is normal in size.  No pericardial effusion. Coronary stent in the 1st diagonal. Coronary atherosclerosis of the right coronary artery. Mediastinum/Nodes: No suspicious mediastinal lymphadenopathy. Visualized thyroid is unremarkable. Lungs/Pleura: Mild dependent atelectasis in the bilateral lower lobes. No focal consolidation. No suspicious pulmonary nodules. No pleural effusion or pneumothorax. Musculoskeletal: Degenerative changes of the thoracic spine. Review of the MIP images confirms the above findings. CT ABDOMEN and PELVIS FINDINGS Hepatobiliary: Liver is within normal limits. Gallbladder is unremarkable. No intrahepatic or extrahepatic ductal dilatation. Pancreas: Within normal limits. Spleen: Within normal limits. Adrenals/Urinary Tract: Adrenal glands are within normal limits. Subcentimeter right renal cysts. Left kidney is within normal limits. No hydronephrosis. Bladder is within normal limits. Stomach/Bowel: Stomach is within normal limits No evidence of bowel obstruction. Normal appendix (series 2/image 69). No colonic wall thickening or inflammatory changes. Vascular/Lymphatic: No evidence of abdominal aortic aneurysm. Atherosclerotic calcifications of the abdominal aorta and branch vessels. No suspicious abdominopelvic lymphadenopathy. Reproductive: Mild prostatomegaly. Other: No abdominopelvic ascites. Musculoskeletal: Visualized osseous structures are within normal limits. Review of the MIP images confirms the above findings. IMPRESSION: No evidence of pulmonary embolism. No acute findings in the chest, abdomen,  or pelvis. No interval change from recent CTs. Electronically Signed   By: Julian Hy M.D.   On: 01/07/2021 01:14   CT ABDOMEN PELVIS W CONTRAST  Result Date: 01/07/2021 CLINICAL DATA:  Chest pain, status post heart attack with stent placement, upper abdominal pain. EXAM: CT ANGIOGRAPHY CHEST CT ABDOMEN AND PELVIS WITH CONTRAST TECHNIQUE: Multidetector CT imaging of the chest was performed using the  standard protocol during bolus administration of intravenous contrast. Multiplanar CT image reconstructions and MIPs were obtained to evaluate the vascular anatomy. Multidetector CT imaging of the abdomen and pelvis was performed using the standard protocol during bolus administration of intravenous contrast. CONTRAST:  134mL OMNIPAQUE IOHEXOL 350 MG/ML SOLN COMPARISON:  CT abdomen/pelvis dated 01/03/2021. CTA chest abdomen pelvis dated 12/30/2020. FINDINGS: CTA CHEST FINDINGS Cardiovascular: Satisfactory opacification of the bilateral pulmonary arteries to the segmental level. No evidence of pulmonary embolism. Although not tailored for evaluation of the thoracic aorta, there is no evidence of thoracic aortic aneurysm or dissection. The heart is normal in size.  No pericardial effusion. Coronary stent in the 1st diagonal. Coronary atherosclerosis of the right coronary artery. Mediastinum/Nodes: No suspicious mediastinal lymphadenopathy. Visualized thyroid is unremarkable. Lungs/Pleura: Mild dependent atelectasis in the bilateral lower lobes. No focal consolidation. No suspicious pulmonary nodules. No pleural effusion or pneumothorax. Musculoskeletal: Degenerative changes of the thoracic spine. Review of the MIP images confirms the above findings. CT ABDOMEN and PELVIS FINDINGS Hepatobiliary: Liver is within normal limits. Gallbladder is unremarkable. No intrahepatic or extrahepatic ductal dilatation. Pancreas: Within normal limits. Spleen: Within normal limits. Adrenals/Urinary Tract: Adrenal glands are  within normal limits. Subcentimeter right renal cysts. Left kidney is within normal limits. No hydronephrosis. Bladder is within normal limits. Stomach/Bowel: Stomach is within normal limits No evidence of bowel obstruction. Normal appendix (series 2/image 69). No colonic wall thickening or inflammatory changes. Vascular/Lymphatic: No evidence of abdominal aortic aneurysm. Atherosclerotic calcifications of the abdominal aorta and branch vessels. No suspicious abdominopelvic lymphadenopathy. Reproductive: Mild prostatomegaly. Other: No abdominopelvic ascites. Musculoskeletal: Visualized osseous structures are within normal limits. Review of the MIP images confirms the above findings. IMPRESSION: No evidence of pulmonary embolism. No acute findings in the chest, abdomen, or pelvis. No interval change from recent CTs. Electronically Signed   By: Julian Hy M.D.   On: 01/07/2021 01:14    EKG: Independently reviewed.  Sinus rhythm rate 72.  QTc 436.  No significant change from prior.  Assessment/Plan Principal Problem:   Intractable abdominal pain Active Problems:   HTN (hypertension)   Essential hypertension   Status post coronary artery stent placement   Intractable epigastric abdominal pain- significant NSAID use, 800 mg on average 3 times a week.  Denies melena or hematochezia.  No vomiting.  Hemoglobin stable.  Mild T bili elevation 1.5.  Question gastritis, peptic ulcer disease. - CT abdomen and pelvis with contrast without acute abnormality.   - Right upper quadrant ultrasound shows gallbladder wall thickening with dependent sludge and probable tiny stones.  No pericholecystic fluid or sonographic Murphy sign.    -Consult GI in the morning -Bowel rest with clear liquid diet -  N. P.o. midnight - IV Protonix 40 daily -Obtain UDS -No improvement in pain with 4 mg morphine we will start Dilaudid 1 mg every 6 PRN -1 L bolus given, continue N/s 100cc/hr x 20hr  Recent NSTEMI with stent  placement-troponins peaked at 687, and have gradually trended downwards 37 today.  Underwent cardiac cath twice 12/6 and again 12/7 due to persistent chest pain, had 2 stents placed.  Discharged on aspirin and Brilinta.   HTN-elevated to 190s, with uncontrolled pain.  Reports compliance with medications since discharge. -Resume Norvasc 5, lisinopril 20, metoprolol 25, Imdur now on 30 mg( adjusted yesterday). -As needed labetalol 10 mg  Diabetes mellitus-A1c 7.4, glucose 224. - SSI- S -Hold glipizide, metformin, Farxiga  DVT prophylaxis: Lovenox Code Status: Full code Family Communication: Spouse  later at bedside. Disposition Plan: ~ 2 days Consults called: GI Admission status:  Obs tele  Bethena Roys MD [facility]  01/07/2021, 5:09 PM

## 2021-01-07 NOTE — ED Notes (Signed)
Gone to CT

## 2021-01-07 NOTE — Discharge Instructions (Addendum)
Your testing is reassuring.  There is no evidence of a new heart attack or blockage of your stents.  As we discussed you might have some residual small vessel disease which can cause pain.  The cardiologist recommends adjusting your blood pressure medications as we discussed and starting a medication called Imdur.  You should also increase your Norvasc to 10 mg a day and lisinopril to 30 mg a day. You should call the cardiology office to have an appointment scheduled sooner than December 28. Return to the ED if your chest pain becomes worse, you develop exertional chest pain, shortness of breath, vomiting, sweating, any other concerns

## 2021-01-07 NOTE — ED Provider Notes (Signed)
[facility] EMERGENCY DEPARTMENT Provider Note   CSN: [id number] Arrival date & time: 01/07/21  1114     History Chief Complaint  Patient presents with   Chest Pain    Jacob Baker is a 50 y.o. male.  HPI  50 year old male with past medical history of CAD, recent NSTEMI with stent, CHF, DM, DVT, HTN, HLD presents to the emergency department with ongoing epigastric and chest pain.  Patient recently had a stent placed, he is come back to the ER multiple times for ongoing epigastric/chest pain.  Patient states the pain is epigastric, causes nausea, he is not able to p.o. or tolerate his oral medications, specifically his hypertensive medications.  He has been put on multiple different medications and his omeprazole has been increased without any relief.  Denies any fever.  Intermittently loose stools.  In regards to the chest pain this is chronic, ongoing, exertional.  Not present at rest.  No shortness of breath or hemoptysis.   Past Medical History:  Diagnosis Date   CHF (congestive heart failure) ([place])    Diabetes mellitus without complication ([place])    DVT (deep venous thrombosis) ([place]) 04/2017   GERD (gastroesophageal reflux disease)    History of migraine    none in over 2 yrs   Hyperlipidemia    Hypertension    Syncope and collapse    x1 - approx 2-3 yrs ago - dehydration    Patient Active Problem List   Diagnosis Date Noted   Intractable abdominal pain 01/07/2021   Status post coronary artery stent placement    Leukocytosis 12/31/2020   Hyperglycemia due to diabetes mellitus ([place]) 12/31/2020   Elevated troponin 12/31/2020   GERD (gastroesophageal reflux disease) 12/31/2020   NSTEMI (non-ST elevated myocardial infarction) ([place]) 12/31/2020   Chronic right shoulder pain 10/21/2020   Uncontrolled type 2 diabetes mellitus with hyperglycemia ([place]) 10/26/2018   Mixed hyperlipidemia 10/26/2018   Chronic idiopathic constipation    Change in bowel habits    Polyp of  sigmoid colon    Rectal polyp    Chronic pain of right hip 10/15/2015   Neuropathy 07/24/2015   Essential hypertension 07/24/2015   Dyslipidemia with low high density lipoprotein (HDL) cholesterol with hypertriglyceridemia due to type 2 diabetes mellitus ([place]) 07/24/2015   Encounter for preventive health examination 10/26/2014   Pituitary insufficiency ([place]) 09/12/2014   Low testosterone 09/11/2014   Onychomycosis 04/18/2014   Hyperlipidemia associated with type 2 diabetes mellitus ([place]) 11/26/2013   Current smoker 11/24/2013   Hiatal hernia 11/15/2013   Tubular adenoma of colon 65/68/1275   History of Helicobacter pylori infection 10/18/2013   Gastritis due to nonsteroidal anti-inflammatory drug (NSAID) 10/14/2013   HTN (hypertension) 04/26/2013   Anxiety 04/26/2013   Chest pain 10/12/2011    Past Surgical History:  Procedure Laterality Date   COLONOSCOPY     COLONOSCOPY WITH PROPOFOL N/A 08/06/2017   Procedure: COLONOSCOPY WITH PROPOFOL;  Surgeon: Lucilla Lame, MD;  Location: [place];  Service: Endoscopy;  Laterality: N/A;  Diabetic - oral meds   CORONARY STENT INTERVENTION N/A 12/31/2020   Procedure: CORONARY STENT INTERVENTION;  Surgeon: Belva Crome, MD;  Location: [place] CV LAB;  Service: Cardiovascular;  Laterality: N/A;   CORONARY STENT INTERVENTION N/A 01/01/2021   Procedure: CORONARY STENT INTERVENTION;  Surgeon: [name], MD;  Location: [place] [facility];  Service: Cardiovascular;  Laterality: N/A;   ESOPHAGEAL DILATION  09/24/2016   Procedure: ESOPHAGEAL DILATION;  Surgeon: Lucilla Lame,  MD;  Location: [place];  Service: Gastroenterology;;   ESOPHAGOGASTRODUODENOSCOPY (EGD) WITH PROPOFOL N/A 04/08/2015   Procedure: ESOPHAGOGASTRODUODENOSCOPY (EGD) WITH PROPOFOL with dialation;  Surgeon: Lucilla Lame, MD;  Location: [place];  Service: Endoscopy;  Laterality: N/A;  diabetic - oral meds   ESOPHAGOGASTRODUODENOSCOPY (EGD)  WITH PROPOFOL N/A 09/24/2016   Procedure: ESOPHAGOGASTRODUODENOSCOPY (EGD) WITH PROPOFOL WITH DILATION;  Surgeon: Lucilla Lame, MD;  Location: [place];  Service: Gastroenterology;  Laterality: N/A;  Diabetic - oral meds   FINGER AMPUTATION  2009   partial removal left index finger    LEFT HEART CATH AND CORONARY ANGIOGRAPHY N/A 12/31/2020   Procedure: LEFT HEART CATH AND CORONARY ANGIOGRAPHY;  Surgeon: Belva Crome, MD;  Location: [place] [facility];  Service: Cardiovascular;  Laterality: N/A;   LEFT HEART CATH AND CORONARY ANGIOGRAPHY N/A 01/01/2021   Procedure: LEFT HEART CATH AND CORONARY ANGIOGRAPHY;  Surgeon: [name], MD;  Location: [place] [facility];  Service: Cardiovascular;  Laterality: N/A;   POLYPECTOMY  08/06/2017   Procedure: POLYPECTOMY INTESTINAL;  Surgeon: Lucilla Lame, MD;  Location: [place];  Service: Endoscopy;;       Family History  Problem Relation Age of Onset   Heart disease Father        CAD   Hypertension Father    Diabetes Father    Hypertension Sister    Hypertension Brother    Hypertension Brother    Hypertension Mother     Social History   Tobacco Use   Smoking status: Every Day    Packs/day: 2.00    Years: 27.00    Pack years: 54.00    Types: Cigarettes   Smokeless tobacco: Never  Vaping Use   Vaping Use: Never used  Substance Use Topics   Alcohol use: No   Drug use: No    Home Medications Prior to Admission medications   Medication Sig Start Date End Date Taking? Authorizing Provider  amLODipine (NORVASC) 5 MG tablet Take 1 tablet (5 mg total) by mouth daily. 01/02/21  Yes Tobb, Kardie, DO  aspirin 81 MG EC tablet Take 1 tablet (81 mg total) by mouth daily. Swallow whole. 01/02/21  Yes Tobb, Kardie, DO  bisacodyl (DULCOLAX) 10 MG suppository Place 1 suppository (10 mg total) rectally as needed for moderate constipation. 01/03/21  Yes Maudie Flakes, MD  cyanocobalamin 1000 MCG tablet Take 100 mcg by mouth  daily.   Yes [provider]  dapagliflozin propanediol (FARXIGA) 10 MG TABS tablet Take 1 tablet (10 mg total) by mouth daily. 01/02/21  Yes Tobb, Kardie, DO  glipiZIDE (GLUCOTROL XL) 5 MG 24 hr tablet Take 2 tablets (10 mg total) by mouth daily with breakfast. 08/03/20  Yes [name], MD  glucose blood (ONETOUCH VERIO) test strip Test sugars twice daily 04/26/14  Yes Phadke, Radhika P, MD  isosorbide mononitrate (IMDUR) 30 MG 24 hr tablet Take 1 tablet (30 mg total) by mouth daily. 01/07/21  Yes Rancour, Annie Main, MD  lisinopril (ZESTRIL) 20 MG tablet Take 1 tablet (20 mg total) by mouth daily. 01/02/21  Yes Tobb, Kardie, DO  metFORMIN (GLUCOPHAGE) 850 MG tablet TAKE 1 TABLET(850 MG) BY MOUTH TWICE DAILY WITH A MEAL. 01/03/21  Yes Tobb, Kardie, DO  metoprolol tartrate (LOPRESSOR) 25 MG tablet Take 1 tablet (25 mg total) by mouth 2 (two) times daily. 01/02/21  Yes Tobb, Kardie, DO  nicotine (NICODERM CQ - DOSED IN MG/24 HOURS) 21 mg/24hr patch Place 1  patch (21 mg total) onto the skin daily. 01/02/21 01/02/22 Yes Tobb, Kardie, DO  nitroGLYCERIN (NITROSTAT) 0.4 MG SL tablet Place 1 tablet (0.4 mg total) under the tongue every 5 (five) minutes as needed for chest pain. 01/02/21 01/02/22 Yes Tobb, Kardie, DO  ONETOUCH DELICA LANCETS 02D MISC Test sugars twice daily 04/26/14  Yes Phadke, Radhika P, MD  pantoprazole (PROTONIX) 40 MG tablet TAKE 1 TABLET(40 MG) BY MOUTH DAILY 07/22/20  Yes [name], MD  polyethylene glycol (MIRALAX) 17 g packet Take 17 g by mouth daily. 01/03/21  Yes Maudie Flakes, MD  rosuvastatin (CRESTOR) 40 MG tablet Take 1 tablet (40 mg total) by mouth daily. 01/02/21  Yes Tobb, Kardie, DO  ticagrelor (BRILINTA) 90 MG TABS tablet Take 1 tablet (90 mg total) by mouth 2 (two) times daily. 01/02/21  Yes Tobb, Kardie, DO  albuterol (VENTOLIN HFA) 108 (90 Base) MCG/ACT inhaler Inhale 2 puffs into the lungs every 6 (six) hours as needed for wheezing or shortness of breath. Patient  not taking: Reported on 12/31/2020 04/28/19   [name], MD  Plecanatide (TRULANCE) 3 MG TABS Take 3 mg by mouth daily. Patient not taking: Reported on 12/31/2020 05/20/20   Lucilla Lame, MD    Allergies    Patient has no known allergies.  Review of Systems   Review of Systems  Constitutional:  Negative for chills and fever.  HENT:  Negative for congestion.   Eyes:  Negative for visual disturbance.  Respiratory:  Negative for shortness of breath.   Cardiovascular:  Positive for chest pain.  Gastrointestinal:  Positive for abdominal pain and nausea. Negative for diarrhea and vomiting.  Genitourinary:  Negative for dysuria.  Musculoskeletal:  Negative for back pain.  Skin:  Negative for rash.  Neurological:  Negative for headaches.   Physical Exam Updated Vital Signs BP (!) 171/75    Pulse 82    Temp 98.1 F (36.7 C) (Oral)    Resp 17    Ht 6\' 1"  (1.854 m)    Wt 88.5 kg    SpO2 100%    BMI 25.73 kg/m   Physical Exam Vitals and nursing note reviewed.  Constitutional:      Appearance: Normal appearance.  HENT:     Head: Normocephalic.     Mouth/Throat:     Mouth: Mucous membranes are moist.  Cardiovascular:     Rate and Rhythm: Normal rate.  Pulmonary:     Effort: Pulmonary effort is normal. No respiratory distress.  Abdominal:     Palpations: Abdomen is soft.     Tenderness: There is abdominal tenderness. There is guarding. There is no rebound.     Comments: Epigastric tenderness with voluntary guarding, soft abdomen  Skin:    General: Skin is warm.  Neurological:     Mental Status: He is alert and oriented to person, place, and time. Mental status is at baseline.  Psychiatric:        Mood and Affect: Mood normal.    ED Results / Procedures / Treatments   Labs (all labs ordered are listed, but only abnormal results are displayed) Labs Reviewed  BASIC METABOLIC PANEL - Abnormal; Notable for the following components:      Result Value   Glucose, Bld 224 (*)     All other components within normal limits  CBC - Abnormal; Notable for the following components:   WBC 19.0 (*)    HCT 37.2 (*)    All other components  within normal limits  HEPATIC FUNCTION PANEL - Abnormal; Notable for the following components:   ALT 75 (*)    Total Bilirubin 1.6 (*)    Bilirubin, Direct 0.5 (*)    Indirect Bilirubin 1.1 (*)    All other components within normal limits  TROPONIN I (HIGH SENSITIVITY) - Abnormal; Notable for the following components:   Troponin I (High Sensitivity) 46 (*)    All other components within normal limits  TROPONIN I (HIGH SENSITIVITY) - Abnormal; Notable for the following components:   Troponin I (High Sensitivity) 37 (*)    All other components within normal limits  BRAIN NATRIURETIC PEPTIDE  LIPASE, BLOOD    EKG None  Radiology DG Chest 2 View  Result Date: 01/07/2021 CLINICAL DATA:  Chest pain and shortness of breath. EXAM: CHEST - 2 VIEW COMPARISON:  CT chest from same day.  Chest x-ray from yesterday. FINDINGS: The heart size and mediastinal contours are within normal limits. Both lungs are clear. The visualized skeletal structures are unremarkable. IMPRESSION: No active cardiopulmonary disease. Electronically Signed   By: Titus Dubin M.D.   On: 01/07/2021 12:18   DG Chest 2 View  Result Date: 01/07/2021 CLINICAL DATA:  Chest pain. EXAM: CHEST - 2 VIEW COMPARISON:  Chest radiograph dated 12/30/2020. FINDINGS: No focal consolidation, pleural effusion, pneumothorax. The cardiac silhouette is within limits. No acute osseous pathology. IMPRESSION: No active cardiopulmonary disease. Electronically Signed   By: Anner Crete M.D.   On: 01/07/2021 00:02   CT Angio Chest PE W and/or Wo Contrast  Result Date: 01/07/2021 CLINICAL DATA:  Chest pain, status post heart attack with stent placement, upper abdominal pain. EXAM: CT ANGIOGRAPHY CHEST CT ABDOMEN AND PELVIS WITH CONTRAST TECHNIQUE: Multidetector CT imaging of the chest was  performed using the standard protocol during bolus administration of intravenous contrast. Multiplanar CT image reconstructions and MIPs were obtained to evaluate the vascular anatomy. Multidetector CT imaging of the abdomen and pelvis was performed using the standard protocol during bolus administration of intravenous contrast. CONTRAST:  167mL OMNIPAQUE IOHEXOL 350 MG/ML SOLN COMPARISON:  CT abdomen/pelvis dated 01/03/2021. CTA chest abdomen pelvis dated 12/30/2020. FINDINGS: CTA CHEST FINDINGS Cardiovascular: Satisfactory opacification of the bilateral pulmonary arteries to the segmental level. No evidence of pulmonary embolism. Although not tailored for evaluation of the thoracic aorta, there is no evidence of thoracic aortic aneurysm or dissection. The heart is normal in size.  No pericardial effusion. Coronary stent in the 1st diagonal. Coronary atherosclerosis of the right coronary artery. Mediastinum/Nodes: No suspicious mediastinal lymphadenopathy. Visualized thyroid is unremarkable. Lungs/Pleura: Mild dependent atelectasis in the bilateral lower lobes. No focal consolidation. No suspicious pulmonary nodules. No pleural effusion or pneumothorax. Musculoskeletal: Degenerative changes of the thoracic spine. Review of the MIP images confirms the above findings. CT ABDOMEN and PELVIS FINDINGS Hepatobiliary: Liver is within normal limits. Gallbladder is unremarkable. No intrahepatic or extrahepatic ductal dilatation. Pancreas: Within normal limits. Spleen: Within normal limits. Adrenals/Urinary Tract: Adrenal glands are within normal limits. Subcentimeter right renal cysts. Left kidney is within normal limits. No hydronephrosis. Bladder is within normal limits. Stomach/Bowel: Stomach is within normal limits No evidence of bowel obstruction. Normal appendix (series 2/image 69). No colonic wall thickening or inflammatory changes. Vascular/Lymphatic: No evidence of abdominal aortic aneurysm. Atherosclerotic  calcifications of the abdominal aorta and branch vessels. No suspicious abdominopelvic lymphadenopathy. Reproductive: Mild prostatomegaly. Other: No abdominopelvic ascites. Musculoskeletal: Visualized osseous structures are within normal limits. Review of the MIP images confirms the above findings. IMPRESSION:  No evidence of pulmonary embolism. No acute findings in the chest, abdomen, or pelvis. No interval change from recent CTs. Electronically Signed   By: Julian Hy M.D.   On: 01/07/2021 01:14   CT ABDOMEN PELVIS W CONTRAST  Result Date: 01/07/2021 CLINICAL DATA:  Chest pain, status post heart attack with stent placement, upper abdominal pain. EXAM: CT ANGIOGRAPHY CHEST CT ABDOMEN AND PELVIS WITH CONTRAST TECHNIQUE: Multidetector CT imaging of the chest was performed using the standard protocol during bolus administration of intravenous contrast. Multiplanar CT image reconstructions and MIPs were obtained to evaluate the vascular anatomy. Multidetector CT imaging of the abdomen and pelvis was performed using the standard protocol during bolus administration of intravenous contrast. CONTRAST:  123mL OMNIPAQUE IOHEXOL 350 MG/ML SOLN COMPARISON:  CT abdomen/pelvis dated 01/03/2021. CTA chest abdomen pelvis dated 12/30/2020. FINDINGS: CTA CHEST FINDINGS Cardiovascular: Satisfactory opacification of the bilateral pulmonary arteries to the segmental level. No evidence of pulmonary embolism. Although not tailored for evaluation of the thoracic aorta, there is no evidence of thoracic aortic aneurysm or dissection. The heart is normal in size.  No pericardial effusion. Coronary stent in the 1st diagonal. Coronary atherosclerosis of the right coronary artery. Mediastinum/Nodes: No suspicious mediastinal lymphadenopathy. Visualized thyroid is unremarkable. Lungs/Pleura: Mild dependent atelectasis in the bilateral lower lobes. No focal consolidation. No suspicious pulmonary nodules. No pleural effusion or  pneumothorax. Musculoskeletal: Degenerative changes of the thoracic spine. Review of the MIP images confirms the above findings. CT ABDOMEN and PELVIS FINDINGS Hepatobiliary: Liver is within normal limits. Gallbladder is unremarkable. No intrahepatic or extrahepatic ductal dilatation. Pancreas: Within normal limits. Spleen: Within normal limits. Adrenals/Urinary Tract: Adrenal glands are within normal limits. Subcentimeter right renal cysts. Left kidney is within normal limits. No hydronephrosis. Bladder is within normal limits. Stomach/Bowel: Stomach is within normal limits No evidence of bowel obstruction. Normal appendix (series 2/image 69). No colonic wall thickening or inflammatory changes. Vascular/Lymphatic: No evidence of abdominal aortic aneurysm. Atherosclerotic calcifications of the abdominal aorta and branch vessels. No suspicious abdominopelvic lymphadenopathy. Reproductive: Mild prostatomegaly. Other: No abdominopelvic ascites. Musculoskeletal: Visualized osseous structures are within normal limits. Review of the MIP images confirms the above findings. IMPRESSION: No evidence of pulmonary embolism. No acute findings in the chest, abdomen, or pelvis. No interval change from recent CTs. Electronically Signed   By: Julian Hy M.D.   On: 01/07/2021 01:14    Procedures Procedures   Medications Ordered in ED Medications  famotidine (PEPCID) IVPB 20 mg premix (has no administration in time range)  labetalol (NORMODYNE) injection 10 mg (has no administration in time range)  morphine 4 MG/ML injection 4 mg (4 mg Intravenous Given 01/07/21 1407)  sodium chloride 0.9 % bolus 1,000 mL (1,000 mLs Intravenous New Bag/Given 01/07/21 1457)    ED Course  I have reviewed the triage vital signs and the nursing notes.  Pertinent labs & imaging results that were available during my care of the patient were reviewed by me and considered in my medical decision making (see chart for details).    MDM  Rules/Calculators/A&P                           50 year old male presents emergency department ongoing epigastric abdominal pain.  He states this radiates up into his chest.  He is had chronic ongoing chest pain stemming from an NSTEMI with recent stent placement.  He is been seen in the emergency department multiple times for this complaint, troponins  are appropriately downtrending.  Most recently he was seen last evening with the same complaints.  Troponin was downtrending, cardiology was contacted and they have cleared him from their standpoint.  He had a CT of the chest abdomen pelvis done yesterday.  No identified PE, abdominal exam was negative.  He was treated symptomatically and discharged.  He returns today with worsening epigastric pain.  He is tender on exam, otherwise nondistended and nonacute abdomen.  He is hypertensive, has not been able to tolerate his oral medications.  Afebrile.  Repeat blood work today looks stable.  He just had the recent CT scan so I do not feel any repeat emergent imaging is warranted.  Patient will be admitted for unresolved abdominal pain for IV therapy and possible further evaluation, imaging and consultation.  Patients evaluation and results requires admission for further treatment and care. Patient agrees with admission plan, offers no new complaints and is stable/unchanged at time of admit.  Final Clinical Impression(s) / ED Diagnoses Final diagnoses:  Epigastric pain  Generalized abdominal pain    Rx / DC Orders ED Discharge Orders     None        Lorelle Gibbs, DO 01/07/21 8887

## 2021-01-07 NOTE — ED Triage Notes (Signed)
BIB EMS complaining of chest pain hx of MI and stents and told on the 12th he has a blockage that is going to continue to have pain.  Was given fentanyl 183mcg, zofran 4mg  1 nitro and ASA 324mg  with EMS.

## 2021-01-07 NOTE — Telephone Encounter (Signed)
Agree with pcp evaluation   Zandra Abts MD

## 2021-01-07 NOTE — ED Notes (Signed)
Edp notified of elevated bp

## 2021-01-07 NOTE — ED Notes (Signed)
Pt's spouse is requesting to speak with EDP in regards to pt not receiving pain medication. When this RN tried to explain that pt was receiving the nitroglycerin for his chest pain she continued to say he was receiving nitro for a heart attack.  Dr. Wyvonnia Dusky made aware of spouses' request to speak with hime

## 2021-01-08 ENCOUNTER — Observation Stay (HOSPITAL_COMMUNITY): Payer: BC Managed Care – PPO

## 2021-01-08 ENCOUNTER — Encounter (HOSPITAL_COMMUNITY): Payer: Self-pay | Admitting: Internal Medicine

## 2021-01-08 DIAGNOSIS — Z86718 Personal history of other venous thrombosis and embolism: Secondary | ICD-10-CM | POA: Diagnosis not present

## 2021-01-08 DIAGNOSIS — E1165 Type 2 diabetes mellitus with hyperglycemia: Secondary | ICD-10-CM | POA: Diagnosis present

## 2021-01-08 DIAGNOSIS — R110 Nausea: Secondary | ICD-10-CM | POA: Diagnosis not present

## 2021-01-08 DIAGNOSIS — R1011 Right upper quadrant pain: Secondary | ICD-10-CM | POA: Diagnosis not present

## 2021-01-08 DIAGNOSIS — Z8249 Family history of ischemic heart disease and other diseases of the circulatory system: Secondary | ICD-10-CM | POA: Diagnosis not present

## 2021-01-08 DIAGNOSIS — I252 Old myocardial infarction: Secondary | ICD-10-CM | POA: Diagnosis not present

## 2021-01-08 DIAGNOSIS — R7989 Other specified abnormal findings of blood chemistry: Secondary | ICD-10-CM

## 2021-01-08 DIAGNOSIS — E785 Hyperlipidemia, unspecified: Secondary | ICD-10-CM | POA: Diagnosis present

## 2021-01-08 DIAGNOSIS — Z7902 Long term (current) use of antithrombotics/antiplatelets: Secondary | ICD-10-CM | POA: Diagnosis not present

## 2021-01-08 DIAGNOSIS — I509 Heart failure, unspecified: Secondary | ICD-10-CM | POA: Diagnosis present

## 2021-01-08 DIAGNOSIS — D649 Anemia, unspecified: Secondary | ICD-10-CM | POA: Diagnosis present

## 2021-01-08 DIAGNOSIS — Z955 Presence of coronary angioplasty implant and graft: Secondary | ICD-10-CM | POA: Diagnosis not present

## 2021-01-08 DIAGNOSIS — Z20822 Contact with and (suspected) exposure to covid-19: Secondary | ICD-10-CM | POA: Diagnosis present

## 2021-01-08 DIAGNOSIS — Z8719 Personal history of other diseases of the digestive system: Secondary | ICD-10-CM | POA: Diagnosis present

## 2021-01-08 DIAGNOSIS — K810 Acute cholecystitis: Secondary | ICD-10-CM | POA: Diagnosis present

## 2021-01-08 DIAGNOSIS — R1013 Epigastric pain: Secondary | ICD-10-CM | POA: Diagnosis present

## 2021-01-08 DIAGNOSIS — Z89022 Acquired absence of left finger(s): Secondary | ICD-10-CM | POA: Diagnosis not present

## 2021-01-08 DIAGNOSIS — I110 Hypertensive heart disease with heart failure: Secondary | ICD-10-CM | POA: Diagnosis present

## 2021-01-08 DIAGNOSIS — Z7984 Long term (current) use of oral hypoglycemic drugs: Secondary | ICD-10-CM | POA: Diagnosis not present

## 2021-01-08 DIAGNOSIS — I2510 Atherosclerotic heart disease of native coronary artery without angina pectoris: Secondary | ICD-10-CM | POA: Diagnosis present

## 2021-01-08 DIAGNOSIS — E876 Hypokalemia: Secondary | ICD-10-CM | POA: Diagnosis present

## 2021-01-08 DIAGNOSIS — K219 Gastro-esophageal reflux disease without esophagitis: Secondary | ICD-10-CM | POA: Diagnosis present

## 2021-01-08 DIAGNOSIS — Z7982 Long term (current) use of aspirin: Secondary | ICD-10-CM | POA: Diagnosis not present

## 2021-01-08 DIAGNOSIS — Z79899 Other long term (current) drug therapy: Secondary | ICD-10-CM | POA: Diagnosis not present

## 2021-01-08 DIAGNOSIS — F17210 Nicotine dependence, cigarettes, uncomplicated: Secondary | ICD-10-CM | POA: Diagnosis present

## 2021-01-08 LAB — CBC
HCT: 35.9 % — ABNORMAL LOW (ref 39.0–52.0)
Hemoglobin: 12.4 g/dL — ABNORMAL LOW (ref 13.0–17.0)
MCH: 28.4 pg (ref 26.0–34.0)
MCHC: 34.5 g/dL (ref 30.0–36.0)
MCV: 82.3 fL (ref 80.0–100.0)
Platelets: 248 10*3/uL (ref 150–400)
RBC: 4.36 MIL/uL (ref 4.22–5.81)
RDW: 13.2 % (ref 11.5–15.5)
WBC: 23.8 10*3/uL — ABNORMAL HIGH (ref 4.0–10.5)
nRBC: 0 % (ref 0.0–0.2)

## 2021-01-08 LAB — RAPID URINE DRUG SCREEN, HOSP PERFORMED
Amphetamines: NOT DETECTED
Barbiturates: NOT DETECTED
Benzodiazepines: NOT DETECTED
Cocaine: NOT DETECTED
Opiates: POSITIVE — AB
Tetrahydrocannabinol: POSITIVE — AB

## 2021-01-08 LAB — BASIC METABOLIC PANEL
Anion gap: 12 (ref 5–15)
BUN: 10 mg/dL (ref 6–20)
CO2: 22 mmol/L (ref 22–32)
Calcium: 8.7 mg/dL — ABNORMAL LOW (ref 8.9–10.3)
Chloride: 103 mmol/L (ref 98–111)
Creatinine, Ser: 0.83 mg/dL (ref 0.61–1.24)
GFR, Estimated: >60 mL/min (ref >60)
Glucose, Bld: 197 mg/dL — ABNORMAL HIGH (ref 70–99)
Potassium: 3.7 mmol/L (ref 3.5–5.1)
Sodium: 137 mmol/L (ref 135–145)

## 2021-01-08 LAB — GLUCOSE, CAPILLARY
Glucose-Capillary: 165 mg/dL — ABNORMAL HIGH (ref 70–99)
Glucose-Capillary: 167 mg/dL — ABNORMAL HIGH (ref 70–99)
Glucose-Capillary: 185 mg/dL — ABNORMAL HIGH (ref 70–99)
Glucose-Capillary: 210 mg/dL — ABNORMAL HIGH (ref 70–99)
Glucose-Capillary: 214 mg/dL — ABNORMAL HIGH (ref 70–99)
Glucose-Capillary: 227 mg/dL — ABNORMAL HIGH (ref 70–99)

## 2021-01-08 MED ORDER — INSULIN ASPART 100 UNIT/ML IJ SOLN
0.0000 [IU] | Freq: Every day | INTRAMUSCULAR | Status: DC
  Administered 2021-01-08: 22:00:00 2 [IU] via SUBCUTANEOUS

## 2021-01-08 MED ORDER — INSULIN ASPART 100 UNIT/ML IJ SOLN
0.0000 [IU] | Freq: Three times a day (TID) | INTRAMUSCULAR | Status: DC
  Administered 2021-01-09 (×2): 3 [IU] via SUBCUTANEOUS
  Administered 2021-01-09: 2 [IU] via SUBCUTANEOUS
  Administered 2021-01-10: 3 [IU] via SUBCUTANEOUS

## 2021-01-08 MED ORDER — TECHNETIUM TC 99M MEBROFENIN IV KIT
5.0000 | PACK | Freq: Once | INTRAVENOUS | Status: AC | PRN
  Administered 2021-01-08: 13:00:00 5.3 via INTRAVENOUS

## 2021-01-08 MED ORDER — METRONIDAZOLE 500 MG/100ML IV SOLN
500.0000 mg | Freq: Two times a day (BID) | INTRAVENOUS | Status: DC
  Administered 2021-01-08 – 2021-01-10 (×5): 500 mg via INTRAVENOUS
  Filled 2021-01-08 (×5): qty 100

## 2021-01-08 MED ORDER — MORPHINE SULFATE (PF) 4 MG/ML IV SOLN
3.0000 mg | INTRAVENOUS | Status: AC
  Administered 2021-01-08: 14:00:00 3 mg via INTRAVENOUS
  Filled 2021-01-08: qty 1

## 2021-01-08 MED ORDER — SODIUM CHLORIDE 0.9 % IV SOLN
2.0000 g | INTRAVENOUS | Status: DC
  Administered 2021-01-08 – 2021-01-10 (×3): 2 g via INTRAVENOUS
  Filled 2021-01-08 (×3): qty 20

## 2021-01-08 MED ORDER — PANTOPRAZOLE SODIUM 40 MG IV SOLR
40.0000 mg | Freq: Two times a day (BID) | INTRAVENOUS | Status: DC
  Administered 2021-01-08 – 2021-01-13 (×11): 40 mg via INTRAVENOUS
  Filled 2021-01-08 (×11): qty 40

## 2021-01-08 NOTE — Progress Notes (Signed)
Inpatient Diabetes Program Recommendations  AACE/ADA: New Consensus Statement on Inpatient Glycemic Control   Target Ranges:  Prepandial:   less than 140 mg/dL      Peak postprandial:   less than 180 mg/dL (1-2 hours)      Critically ill patients:  140 - 180 mg/dL    Latest Reference Range & Units 01/08/21 00:13 01/08/21 04:28 01/08/21 07:40  Glucose-Capillary 70 - 99 mg/dL 227 (H) 185 (H) 214 (H)    Review of Glycemic Control  Diabetes history: DM2 Outpatient Diabetes medications: Glipizide XL 10 mg QAM, Farxiga 10 mg daily, Metformin 850 mg BID Current orders for Inpatient glycemic control: Novolog 0-9 units Q4H  Inpatient Diabetes Program Recommendations:    Insulin: Please consider ordering Semglee 8 units Q24H.  Thanks, Barnie Alderman, RN, MSN, CDE Diabetes Coordinator Inpatient Diabetes Program [phone] (Team Pager from 8am to 5pm)

## 2021-01-08 NOTE — Progress Notes (Signed)
°  Transition of Care [facility]) Screening Note   Patient Details  Name: Jacob Baker Date of Birth: Nov 20, 1970   Transition of Care [facility]) CM/SW Contact:    Boneta Lucks, RN Phone Number: 01/08/2021, 3:31 PM    Transition of Care Department [facility]) has reviewed patient and no TOC needs have been identified at this time. We will continue to monitor patient advancement through interdisciplinary progression rounds. If new patient transition needs arise, please place a TOC consult.

## 2021-01-08 NOTE — Consult Note (Signed)
Referring Provider: No ref. provider found Primary Care Physician:  [name], MD Primary Gastroenterologist:  Dr.Wohl  Date of Admission:  Date of Consultation:   Reason for Consultation:  abdominal pain  HPI:  Jacob Baker is a 50 y.o. year old male with medical history of CAD, HTN, DM who presnted to the ED with persistent abdominal pain that started after he was hospitalized for NSTEMI and underwent caridac cath on 12/6-12/7. He denied associated vomiting or diarrhea. Pain is mostly in upper abdomen, he denied rectal bleeding or melena. Food intake does not affect his pain. Reports frequent NSAID use of 800mg  3x/week on average for headaches. He has had 3 visits total to the ED for abdominal pain in the past 6 days, troponins have been downtrending, CTA to rule out PE was unremarkable.   ED course:hypertensive in the 190s, other VSS.lipase WNL 26, Slight ALT elevation at 75, T bili 1.6, cxray unremarkable, RUQ [place] with gallbladder wall thickening and depending sludge with probably tiny stones, no pericholecystic fluid or sonographic Murphy's sign.  Consults: Labs WBC 23.8, hgb 12.4, ALT 75, T bili 1.6, ID Bili 1.1, Direct bili 0.5 Patient reports that he was awakened by  RUQ  pain a few days ago, reports that pain feels like a "contraction" In his right upper abdomen, he endorses some associated nausea without vomiting. Pain is constant, worse with movement or deep breathing. He denies any radiation of pain. He is unsure if eating makes pain worse or better as he has not been eating since it began. He denies any changes in appetite or difficulty eating prior to acute pain. He does endorse some issues with dysphagia for the past 1-2 days, happens with liquids or pills, feeling like they get stuck in his throat. Has 1 BM per day usually but sometimes he will go 3-4 days without having a BM. He states that his GI doctor has not been able to determine a cause of his infrequent BMs. He does  not take anything for constipation. No melena or rectal bleeding. Having a BM does not improve his abdominal pain. He denies any recent weight loss. Denies issues with acid reflux. He was previously taking 4-5 800mg  ibuprofen per week, this was back in the summer. Has not had any since then. He denies any early satiety prior to acute pain onset. Diet is well balanced, a lot of veggies, greens and tuna. He does not drink any alcohol.   Last Colonoscopy:- 08/06/17 One 7 mm polyp in the rectum, removed with a cold snare. Resected and retrieved. - Three 2 to 3 mm polyps in the sigmoid colon, removed with a cold biopsy forceps. Resected and retrieved. - One 6 mm polyp in the sigmoid colon, removed with a cold snare. Resected and retrieved.  Last EGD: 09/24/16- Normal esophagus. Dilated. - Normal stomach. - Normal examined duodenum. - No specimens collected.  Past Medical History:  Diagnosis Date   CHF (congestive heart failure) ([place])    Diabetes mellitus without complication ([place])    DVT (deep venous thrombosis) ([place]) 04/2017   GERD (gastroesophageal reflux disease)    History of migraine    none in over 2 yrs   Hyperlipidemia    Hypertension    Syncope and collapse    x1 - approx 2-3 yrs ago - dehydration    Past Surgical History:  Procedure Laterality Date   COLONOSCOPY     COLONOSCOPY WITH PROPOFOL N/A 08/06/2017   Procedure: COLONOSCOPY WITH PROPOFOL;  Surgeon: Lucilla Lame, MD;  Location: [facility];  Service: Endoscopy;  Laterality: N/A;  Diabetic - oral meds   CORONARY STENT INTERVENTION N/A 12/31/2020   Procedure: CORONARY STENT INTERVENTION;  Surgeon: Belva Crome, MD;  Location: [facility];  Service: Cardiovascular;  Laterality: N/A;   CORONARY STENT INTERVENTION N/A 01/01/2021   Procedure: CORONARY STENT INTERVENTION;  Surgeon: [name], MD;  Location: [facility];  Service: Cardiovascular;  Laterality: N/A;   ESOPHAGEAL DILATION  09/24/2016    Procedure: ESOPHAGEAL DILATION;  Surgeon: Lucilla Lame, MD;  Location: [place];  Service: Gastroenterology;;   ESOPHAGOGASTRODUODENOSCOPY (EGD) WITH PROPOFOL N/A 04/08/2015   Procedure: ESOPHAGOGASTRODUODENOSCOPY (EGD) WITH PROPOFOL with dialation;  Surgeon: Lucilla Lame, MD;  Location: [place];  Service: Endoscopy;  Laterality: N/A;  diabetic - oral meds   ESOPHAGOGASTRODUODENOSCOPY (EGD) WITH PROPOFOL N/A 09/24/2016   Procedure: ESOPHAGOGASTRODUODENOSCOPY (EGD) WITH PROPOFOL WITH DILATION;  Surgeon: Lucilla Lame, MD;  Location: [place];  Service: Gastroenterology;  Laterality: N/A;  Diabetic - oral meds   FINGER AMPUTATION  2009   partial removal left index finger    LEFT HEART CATH AND CORONARY ANGIOGRAPHY N/A 12/31/2020   Procedure: LEFT HEART CATH AND CORONARY ANGIOGRAPHY;  Surgeon: Belva Crome, MD;  Location: [facility];  Service: Cardiovascular;  Laterality: N/A;   LEFT HEART CATH AND CORONARY ANGIOGRAPHY N/A 01/01/2021   Procedure: LEFT HEART CATH AND CORONARY ANGIOGRAPHY;  Surgeon: [name], MD;  Location: [facility];  Service: Cardiovascular;  Laterality: N/A;   POLYPECTOMY  08/06/2017   Procedure: POLYPECTOMY INTESTINAL;  Surgeon: Lucilla Lame, MD;  Location: [facility];  Service: Endoscopy;;    Prior to Admission medications   Medication Sig Start Date End Date Taking? Authorizing Provider  amLODipine (NORVASC) 5 MG tablet Take 1 tablet (5 mg total) by mouth daily. 01/02/21  Yes Tobb, Kardie, DO  aspirin 81 MG EC tablet Take 1 tablet (81 mg total) by mouth daily. Swallow whole. 01/02/21  Yes Tobb, Kardie, DO  bisacodyl (DULCOLAX) 10 MG suppository Place 1 suppository (10 mg total) rectally as needed for moderate constipation. 01/03/21  Yes Maudie Flakes, MD  cyanocobalamin 1000 MCG tablet Take 100 mcg by mouth daily.   Yes [provider]  dapagliflozin propanediol (FARXIGA) 10 MG TABS tablet Take 1 tablet  (10 mg total) by mouth daily. 01/02/21  Yes Tobb, Kardie, DO  glipiZIDE (GLUCOTROL XL) 5 MG 24 hr tablet Take 2 tablets (10 mg total) by mouth daily with breakfast. 08/03/20  Yes [name], MD  glucose blood (ONETOUCH VERIO) test strip Test sugars twice daily 04/26/14  Yes Phadke, Radhika P, MD  isosorbide mononitrate (IMDUR) 30 MG 24 hr tablet Take 1 tablet (30 mg total) by mouth daily. 01/07/21  Yes Rancour, Annie Main, MD  lisinopril (ZESTRIL) 20 MG tablet Take 1 tablet (20 mg total) by mouth daily. 01/02/21  Yes Tobb, Kardie, DO  metFORMIN (GLUCOPHAGE) 850 MG tablet TAKE 1 TABLET(850 MG) BY MOUTH TWICE DAILY WITH A MEAL. 01/03/21  Yes Tobb, Kardie, DO  metoprolol tartrate (LOPRESSOR) 25 MG tablet Take 1 tablet (25 mg total) by mouth 2 (two) times daily. 01/02/21  Yes Tobb, Kardie, DO  nicotine (NICODERM CQ - DOSED IN MG/24 HOURS) 21 mg/24hr patch Place 1 patch (21 mg total) onto the skin daily. 01/02/21 01/02/22 Yes Tobb, Kardie, DO  nitroGLYCERIN (NITROSTAT) 0.4 MG SL tablet Place 1 tablet (0.4 mg  total) under the tongue every 5 (five) minutes as needed for chest pain. 01/02/21 01/02/22 Yes Tobb, Kardie, DO  ONETOUCH DELICA LANCETS 81E MISC Test sugars twice daily 04/26/14  Yes Phadke, Radhika P, MD  pantoprazole (PROTONIX) 40 MG tablet TAKE 1 TABLET(40 MG) BY MOUTH DAILY 07/22/20  Yes [name], MD  polyethylene glycol (MIRALAX) 17 g packet Take 17 g by mouth daily. 01/03/21  Yes Maudie Flakes, MD  rosuvastatin (CRESTOR) 40 MG tablet Take 1 tablet (40 mg total) by mouth daily. 01/02/21  Yes Tobb, Kardie, DO  ticagrelor (BRILINTA) 90 MG TABS tablet Take 1 tablet (90 mg total) by mouth 2 (two) times daily. 01/02/21  Yes Tobb, Kardie, DO  albuterol (VENTOLIN HFA) 108 (90 Base) MCG/ACT inhaler Inhale 2 puffs into the lungs every 6 (six) hours as needed for wheezing or shortness of breath. Patient not taking: Reported on 12/31/2020 04/28/19   [name], MD  Plecanatide (TRULANCE) 3 MG TABS Take 3 mg  by mouth daily. Patient not taking: Reported on 12/31/2020 05/20/20   Lucilla Lame, MD    Current Facility-Administered Medications  Medication Dose Route Frequency Provider Last Rate Last Admin   0.9 %  sodium chloride infusion   Intravenous Continuous Emokpae, Ejiroghene E, MD 100 mL/hr at 01/08/21 0427 New Bag at 01/08/21 0427   acetaminophen (TYLENOL) tablet 650 mg  650 mg Oral Q6H PRN Emokpae, Ejiroghene E, MD       Or   acetaminophen (TYLENOL) suppository 650 mg  650 mg Rectal Q6H PRN Emokpae, Ejiroghene E, MD       amLODipine (NORVASC) tablet 5 mg  5 mg Oral Daily Emokpae, Ejiroghene E, MD   5 mg at 01/08/21 0857   aspirin EC tablet 81 mg  81 mg Oral Daily Emokpae, Ejiroghene E, MD   81 mg at 01/08/21 0855   heparin injection 5,000 Units  5,000 Units Subcutaneous Q8H Emokpae, Ejiroghene E, MD   5,000 Units at 01/08/21 0611   HYDROmorphone (DILAUDID) injection 1 mg  1 mg Intravenous Q6H PRN Emokpae, Ejiroghene E, MD   1 mg at 01/08/21 0034   insulin aspart (novoLOG) injection 0-9 Units  0-9 Units Subcutaneous Q4H Emokpae, Ejiroghene E, MD   3 Units at 01/08/21 0857   isosorbide mononitrate (IMDUR) 24 hr tablet 30 mg  30 mg Oral Daily Emokpae, Ejiroghene E, MD   30 mg at 01/08/21 0855   labetalol (NORMODYNE) injection 10 mg  10 mg Intravenous Q2H PRN Emokpae, Ejiroghene E, MD       lisinopril (ZESTRIL) tablet 20 mg  20 mg Oral Daily Emokpae, Ejiroghene E, MD   20 mg at 01/08/21 0856   metoprolol tartrate (LOPRESSOR) tablet 25 mg  25 mg Oral BID Emokpae, Ejiroghene E, MD   25 mg at 01/08/21 0854   ondansetron (ZOFRAN) tablet 4 mg  4 mg Oral Q6H PRN Emokpae, Ejiroghene E, MD       Or   ondansetron (ZOFRAN) injection 4 mg  4 mg Intravenous Q6H PRN Emokpae, Ejiroghene E, MD   4 mg at 01/07/21 1655   pantoprazole (PROTONIX) injection 40 mg  40 mg Intravenous Q12H Emokpae, Courage, MD   40 mg at 01/08/21 0902   polyethylene glycol (MIRALAX / GLYCOLAX) packet 17 g  17 g Oral Daily PRN Emokpae,  Ejiroghene E, MD       ticagrelor (BRILINTA) tablet 90 mg  90 mg Oral BID Emokpae, Ejiroghene E, MD   90 mg at  01/08/21 0855    Allergies as of 01/07/2021   (No Known Allergies)    Family History  Problem Relation Age of Onset   Heart disease Father        CAD   Hypertension Father    Diabetes Father    Hypertension Sister    Hypertension Brother    Hypertension Brother    Hypertension Mother     Social History   Socioeconomic History   Marital status: Married    Spouse name: Not on file   Number of children: Not on file   Years of education: Not on file   Highest education level: Not on file  Occupational History   Not on file  Tobacco Use   Smoking status: Every Day    Packs/day: 2.00    Years: 27.00    Pack years: 54.00    Types: Cigarettes   Smokeless tobacco: Never  Vaping Use   Vaping Use: Never used  Substance and Sexual Activity   Alcohol use: No   Drug use: No   Sexual activity: Not on file  Other Topics Concern   Not on file  Social History Narrative   Not on file   Social Determinants of Health   Financial Resource Strain: Not on file  Food Insecurity: Not on file  Transportation Needs: Not on file  Physical Activity: Not on file  Stress: Not on file  Social Connections: Not on file  Intimate Partner Violence: Not on file   Review of Systems: Gen: Denies fever, chills, loss of appetite, change in weight or weight loss CV: Denies chest pain, heart palpitations, syncope, edema  Resp: Denies shortness of breath with rest, cough, wheezing GI: Denies melena, hematochezia, vomiting, diarrhea, odyonophagia, early satiety or weight loss. +RUQ pain +nausea +constipation +occasional dysphagia GU : Denies urinary burning, urinary frequency, urinary incontinence.  MS: Denies joint pain,swelling, cramping Derm: Denies rash, itching, dry skin Psych: Denies depression, anxiety,confusion, or memory loss Heme: Denies bruising, bleeding, and enlarged lymph  nodes.  Physical Exam: Vital signs in last 24 hours: Temp:  [98.1 F (36.7 C)-99.3 F (37.4 C)] 98.1 F (36.7 C) (12/14 0505) Pulse Rate:  [71-106] 95 (12/14 0505) Resp:  [14-21] 18 (12/14 0505) BP: (148-193)/(75-108) 148/92 (12/14 0505) SpO2:  [97 %-100 %] 97 % (12/14 0505) Weight:  [88.5 kg] 88.5 kg (12/13 1123)   General:   Alert,  Well-developed, well-nourished, pleasant and cooperative in NAD Head: scleral icterus Ears:  Normal auditory acuity. Nose:  No deformity, discharge,  or lesions. Mouth:  No deformity or lesions, dentition normal. Lungs:  Clear throughout to auscultation.   No wheezes, crackles, or rhonchi. No acute distress. Heart:  Regular rate and rhythm; no murmurs, clicks, rubs,  or gallops. Abdomen:  Soft, nondistended. No masses, hepatosplenomegaly or hernias noted. Normal bowel sounds, +Murphy's sign, very tender to palpation of RUQ with moderate tenderness in RUQ during palpation of rest of abdomen Rectal:  Deferred until time of colonoscopy.   Msk:  Symmetrical without gross deformities. Normal posture. Pulses:  Normal pulses noted. Extremities:  Without clubbing or edema. Neurologic:  Alert and  oriented x4;  grossly normal neurologically. Skin:  Intact without significant lesions or rashes. Psych:  Alert and cooperative. Normal mood and affect.  Intake/Output from previous day: 12/13 0701 - 12/14 0700 In: 2174.6 [P.O.:200; I.V.:924.6; IV Piggyback:1050] Out: 900 [Urine:900] Intake/Output this shift: No intake/output data recorded.  Lab Results: Recent Labs    01/06/21 2331 01/07/21 1142 01/08/21  0457  WBC 13.0* 19.0* 23.8*  HGB 12.8* 13.1 12.4*  HCT 36.5* 37.2* 35.9*  PLT 253 265 248   BMET Recent Labs    01/06/21 2331 01/07/21 1142 01/08/21 0457  NA 137 137 137  K 3.6 3.9 3.7  CL 106 102 103  CO2 22 23 22   GLUCOSE 165* 224* 197*  BUN 12 11 10   CREATININE 0.95 0.88 0.83  CALCIUM 8.9 9.3 8.7*   LFT Recent Labs    01/06/21 2331  01/07/21 1316  PROT 7.1 7.2  ALBUMIN 4.1 4.3  AST 15 36  ALT 30 75*  ALKPHOS 91 119  BILITOT 0.8 1.6*  BILIDIR 0.1 0.5*  IBILI 0.7 1.1*    Studies/Results: DG Chest 2 View  Result Date: 01/07/2021 CLINICAL DATA:  Chest pain and shortness of breath. EXAM: CHEST - 2 VIEW COMPARISON:  CT chest from same day.  Chest x-ray from yesterday. FINDINGS: The heart size and mediastinal contours are within normal limits. Both lungs are clear. The visualized skeletal structures are unremarkable. IMPRESSION: No active cardiopulmonary disease. Electronically Signed   By: Titus Dubin M.D.   On: 01/07/2021 12:18   DG Chest 2 View  Result Date: 01/07/2021 CLINICAL DATA:  Chest pain. EXAM: CHEST - 2 VIEW COMPARISON:  Chest radiograph dated 12/30/2020. FINDINGS: No focal consolidation, pleural effusion, pneumothorax. The cardiac silhouette is within limits. No acute osseous pathology. IMPRESSION: No active cardiopulmonary disease. Electronically Signed   By: Anner Crete M.D.   On: 01/07/2021 00:02   CT Angio Chest PE W and/or Wo Contrast  Result Date: 01/07/2021 CLINICAL DATA:  Chest pain, status post heart attack with stent placement, upper abdominal pain. EXAM: CT ANGIOGRAPHY CHEST CT ABDOMEN AND PELVIS WITH CONTRAST TECHNIQUE: Multidetector CT imaging of the chest was performed using the standard protocol during bolus administration of intravenous contrast. Multiplanar CT image reconstructions and MIPs were obtained to evaluate the vascular anatomy. Multidetector CT imaging of the abdomen and pelvis was performed using the standard protocol during bolus administration of intravenous contrast. CONTRAST:  128mL OMNIPAQUE IOHEXOL 350 MG/ML SOLN COMPARISON:  CT abdomen/pelvis dated 01/03/2021. CTA chest abdomen pelvis dated 12/30/2020. FINDINGS: CTA CHEST FINDINGS Cardiovascular: Satisfactory opacification of the bilateral pulmonary arteries to the segmental level. No evidence of pulmonary embolism.  Although not tailored for evaluation of the thoracic aorta, there is no evidence of thoracic aortic aneurysm or dissection. The heart is normal in size.  No pericardial effusion. Coronary stent in the 1st diagonal. Coronary atherosclerosis of the right coronary artery. Mediastinum/Nodes: No suspicious mediastinal lymphadenopathy. Visualized thyroid is unremarkable. Lungs/Pleura: Mild dependent atelectasis in the bilateral lower lobes. No focal consolidation. No suspicious pulmonary nodules. No pleural effusion or pneumothorax. Musculoskeletal: Degenerative changes of the thoracic spine. Review of the MIP images confirms the above findings. CT ABDOMEN and PELVIS FINDINGS Hepatobiliary: Liver is within normal limits. Gallbladder is unremarkable. No intrahepatic or extrahepatic ductal dilatation. Pancreas: Within normal limits. Spleen: Within normal limits. Adrenals/Urinary Tract: Adrenal glands are within normal limits. Subcentimeter right renal cysts. Left kidney is within normal limits. No hydronephrosis. Bladder is within normal limits. Stomach/Bowel: Stomach is within normal limits No evidence of bowel obstruction. Normal appendix (series 2/image 69). No colonic wall thickening or inflammatory changes. Vascular/Lymphatic: No evidence of abdominal aortic aneurysm. Atherosclerotic calcifications of the abdominal aorta and branch vessels. No suspicious abdominopelvic lymphadenopathy. Reproductive: Mild prostatomegaly. Other: No abdominopelvic ascites. Musculoskeletal: Visualized osseous structures are within normal limits. Review of the MIP images confirms the above  findings. IMPRESSION: No evidence of pulmonary embolism. No acute findings in the chest, abdomen, or pelvis. No interval change from recent CTs. Electronically Signed   By: Julian Hy M.D.   On: 01/07/2021 01:14   CT ABDOMEN PELVIS W CONTRAST  Result Date: 01/07/2021 CLINICAL DATA:  Chest pain, status post heart attack with stent placement,  upper abdominal pain. EXAM: CT ANGIOGRAPHY CHEST CT ABDOMEN AND PELVIS WITH CONTRAST TECHNIQUE: Multidetector CT imaging of the chest was performed using the standard protocol during bolus administration of intravenous contrast. Multiplanar CT image reconstructions and MIPs were obtained to evaluate the vascular anatomy. Multidetector CT imaging of the abdomen and pelvis was performed using the standard protocol during bolus administration of intravenous contrast. CONTRAST:  155mL OMNIPAQUE IOHEXOL 350 MG/ML SOLN COMPARISON:  CT abdomen/pelvis dated 01/03/2021. CTA chest abdomen pelvis dated 12/30/2020. FINDINGS: CTA CHEST FINDINGS Cardiovascular: Satisfactory opacification of the bilateral pulmonary arteries to the segmental level. No evidence of pulmonary embolism. Although not tailored for evaluation of the thoracic aorta, there is no evidence of thoracic aortic aneurysm or dissection. The heart is normal in size.  No pericardial effusion. Coronary stent in the 1st diagonal. Coronary atherosclerosis of the right coronary artery. Mediastinum/Nodes: No suspicious mediastinal lymphadenopathy. Visualized thyroid is unremarkable. Lungs/Pleura: Mild dependent atelectasis in the bilateral lower lobes. No focal consolidation. No suspicious pulmonary nodules. No pleural effusion or pneumothorax. Musculoskeletal: Degenerative changes of the thoracic spine. Review of the MIP images confirms the above findings. CT ABDOMEN and PELVIS FINDINGS Hepatobiliary: Liver is within normal limits. Gallbladder is unremarkable. No intrahepatic or extrahepatic ductal dilatation. Pancreas: Within normal limits. Spleen: Within normal limits. Adrenals/Urinary Tract: Adrenal glands are within normal limits. Subcentimeter right renal cysts. Left kidney is within normal limits. No hydronephrosis. Bladder is within normal limits. Stomach/Bowel: Stomach is within normal limits No evidence of bowel obstruction. Normal appendix (series 2/image 69).  No colonic wall thickening or inflammatory changes. Vascular/Lymphatic: No evidence of abdominal aortic aneurysm. Atherosclerotic calcifications of the abdominal aorta and branch vessels. No suspicious abdominopelvic lymphadenopathy. Reproductive: Mild prostatomegaly. Other: No abdominopelvic ascites. Musculoskeletal: Visualized osseous structures are within normal limits. Review of the MIP images confirms the above findings. IMPRESSION: No evidence of pulmonary embolism. No acute findings in the chest, abdomen, or pelvis. No interval change from recent CTs. Electronically Signed   By: Julian Hy M.D.   On: 01/07/2021 01:14   US Abdomen Limited RUQ (LIVER/GB)  Result Date: 01/07/2021 CLINICAL DATA:  5 day history of epigastric pain. EXAM: ULTRASOUND ABDOMEN LIMITED RIGHT UPPER QUADRANT COMPARISON:  None. FINDINGS: Gallbladder: Gallbladder wall is thickened up to 5 mm. There is dependent nonshadowing sludge and probable tiny stones. No pericholecystic fluid. Sonographer reports no sonographic Murphy sign. Common bile duct: Diameter: 4-5 mm Liver: No focal lesion identified. Within normal limits in parenchymal echogenicity. Portal vein is patent on color Doppler imaging with normal direction of blood flow towards the liver. Other: None. IMPRESSION: Gallbladder wall thickening with dependent sludge and probable tiny stones. No pericholecystic fluid or sonographic Murphy sign. No biliary dilatation. Nuclear scintigraphy may prove helpful to further evaluate if cystic duct obstruction is a clinical concern. Electronically Signed   By: Misty Stanley M.D.   On: 01/07/2021 16:01    Impression: RADEK CARNERO is a 50 y.o. year old male with medical history of CAD, HTN, DM who presnted to the ED with persistent abdominal pain that awoke him from sleep about a week ago. Pain is mostly in RUQ with  associated nausea, he has constipation at baseline. He reports that appetite and weight were good prior to acute  onset of pain. Pain is worsened by movement but no presence of radiation.   RUQ pain: Significant RUQ pain with palpation, positive Murphy's sign and some scleral icterus, despite only slight elevation in LFTs yesterday with ALT 75, T bili 1.6, bili direct 0.5 and bili indirect 1.1. patient reports pain is constant, worse with movement or inspiration. He has no rectal bleeding or melena. He is without vomiting but has constant nausea. RUQ [place] with some gallbladder thickening, we will proceed with HIDA scan for further evaluation of gallbladder, no endoscopic procedures at this time, will consider EGD if HIDA scan is unremarkable, however, patient's clinical presentation is not overly convincing that pain is originating from upper GI tract. He does admit to frequent ibuprofen use in the past, but has not taken any NSAIDs, reportedly since the summer.  Anemia: hgb 12.4 today, stable. appears to be 12-13 around baseline, he denies any rectal bleeding or melena. We will continue to follow this. Could be secondary to hemodilution from IVF.    Plan: HIDA scan today Anti emetics per hospitalist Remain NPO Pain meds per hospitalist, please avoid opiate pain meds 6 hrs prior to HIDA scan Trend LFTs Continue IV PPI daily    LOS: 0 days    01/08/2021, 9:26 AM  Jacob Mira L. Alver Sorrow, MSN, APRN, AGNP-C Adult-Gerontology Nurse Practitioner [facility]

## 2021-01-08 NOTE — Progress Notes (Signed)
PROGRESS NOTE     Jacob Baker, is a 50 y.o. male, DOB - [birth year], Jacob Baker:[id number]  Admit date - 01/07/2021   Admitting Physician Jacob Heindl Denton Brick, MD  Outpatient Primary MD for the patient is Jacob Mc, MD  LOS - 0  Chief Complaint  Patient presents with   Chest Pain       Brief Narrative:  50 y.o. male with medical history significant for  CAD, HTN, DM. Presented to the ED with persistent abdominal pain that started after he was hospitalized for NSTEMI and underwent cath 2ce- 12/6 and 01/01/21 admitted on 01/07/2021 with right upper quadrant pain and found to have acute cholecystitis  Assessment & Plan:   Principal Problem:   Acute cholecystitis Active Problems:   HTN (hypertension)   Essential hypertension   Hyperglycemia due to diabetes mellitus ([place])   Status post coronary artery stent placement   Intractable abdominal pain   Colicky RUQ abdominal pain   Nausea without vomiting   Elevated LFTs   1) acute cholecystitis--patient presented with right upper quadrant pain as well as leukocytosis and nausea RUQ ultrasound consistent with gallbladder wall thickening, HIDA scan consistent with acute cholecystitis -GI and general surgery input appreciated -not a great candidate for surgery given recent LHC with DAPT -IV Rocephin for now  2)CAD--- recent NSTEMI with angioplasty and stents x 2 placement,s/p LHC 12/31/20 and 01/01/21 -Continue aspirin and Brilinta  3)DM2-A1c 7.4 reflecting uncontrolled DM with hyperglycemia PTA -Continue to hold metformin, Farxiga and glipizide Use Novolog/Humalog Sliding scale insulin with Accu-Cheks/Fingersticks as ordered   Disposition/Need for in-Hospital Stay- patient unable to be discharged at this time due to --acute cholecystitis requiring IV antibiotics, not a great candidate for surgery given recent LHC with DAPT  Status is: Inpatient  Remains inpatient appropriate because: See disposition above  Disposition: The patient  is from: Home              Anticipated d/c is to: Home              Anticipated d/c date is: 2 days              Patient currently is not medically stable to d/c. Barriers: Not Clinically Stable-   Code Status :  -  Code Status: Full Code   Family Communication:    NA (patient is alert, awake and coherent)   Consults  :  Gi/Gen surgery  DVT Prophylaxis  :   - SCDs   heparin injection 5,000 Units Start: 01/07/21 2200    Lab Results  Component Value Date   PLT 248 01/08/2021    Inpatient Medications  Scheduled Meds:  amLODipine  5 mg Oral Daily   aspirin EC  81 mg Oral Daily   heparin  5,000 Units Subcutaneous Q8H   insulin aspart  0-9 Units Subcutaneous Q4H   isosorbide mononitrate  30 mg Oral Daily   lisinopril  20 mg Oral Daily   metoprolol tartrate  25 mg Oral BID   pantoprazole (PROTONIX) IV  40 mg Intravenous Q12H   ticagrelor  90 mg Oral BID   Continuous Infusions:  cefTRIAXone (ROCEPHIN)  IV     PRN Meds:.acetaminophen **OR** acetaminophen, HYDROmorphone (DILAUDID) injection, labetalol, ondansetron **OR** ondansetron (ZOFRAN) IV, polyethylene glycol   Anti-infectives (From admission, onward)    Start     Dose/Rate Route Frequency Ordered Stop   01/08/21 1800  cefTRIAXone (ROCEPHIN) 2 g in sodium chloride 0.9 % 100 mL IVPB  2 g 200 mL/hr over 30 Minutes Intravenous Every 24 hours 01/08/21 1652           Subjective: Jacob Baker today has no fevers, no emesis,  No chest pain,     Objective: Vitals:   01/07/21 2100 01/08/21 0033 01/08/21 0505 01/08/21 1453  BP:  (!) 156/96 (!) 148/92 127/74  Pulse:  96 95 95  Resp:   18 18  Temp:   98.1 F (36.7 C) (!) 97.4 F (36.3 C)  TempSrc:   Oral Oral  SpO2: 99%  97% 97%  Weight:      Height:        Intake/Output Summary (Last 24 hours) at 01/08/2021 1710 Last data filed at 01/08/2021 1300 Gross per 24 hour  Intake 1124.63 ml  Output 900 ml  Net 224.63 ml   Filed Weights   01/07/21 1123   Weight: 88.5 kg   Physical Exam  Gen:- Awake Alert,  in no apparent distress  HEENT:- [place].AT, No sclera icterus Neck-Supple Neck,No JVD,.  Lungs-  CTAB , fair symmetrical air movement CV- S1, S2 normal, regular  Abd-  +ve B.Sounds, Abd Soft, right upper quadrant tenderness Extremity/Skin:- No  edema, pedal pulses present  Psych-affect is appropriate, oriented x3 Neuro-no new focal deficits, no tremors  Data Reviewed: I have personally reviewed following labs and imaging studies  CBC: Recent Labs  Lab 01/02/21 0126 01/03/21 0542 01/06/21 2331 01/07/21 1142 01/08/21 0457  WBC 10.5 13.5* 13.0* 19.0* 23.8*  HGB 12.2* 13.0 12.8* 13.1 12.4*  HCT 35.1* 36.5* 36.5* 37.2* 35.9*  MCV 80.9 82.8 82.8 82.7 82.3  PLT 243 222 253 265 458   Basic Metabolic Panel: Recent Labs  Lab 01/02/21 0126 01/03/21 0542 01/06/21 2331 01/07/21 1142 01/08/21 0457  NA 135 136 137 137 137  K 3.5 3.7 3.6 3.9 3.7  CL 104 105 106 102 103  CO2 24 23 22 23 22   GLUCOSE 168* 175* 165* 224* 197*  BUN 14 23* 12 11 10   CREATININE 0.83 0.99 0.95 0.88 0.83  CALCIUM 8.9 8.5* 8.9 9.3 8.7*   GFR: Estimated Creatinine Clearance: 117.7 mL/min (by C-G formula based on SCr of 0.83 mg/dL). Liver Function Tests: Recent Labs  Lab 01/03/21 0542 01/06/21 2331 01/07/21 1316  AST 16 15 36  ALT 17 30 75*  ALKPHOS 91 91 119  BILITOT 1.0 0.8 1.6*  PROT 6.8 7.1 7.2  ALBUMIN 4.0 4.1 4.3   Recent Labs  Lab 01/03/21 0542 01/06/21 2331 01/07/21 1316  LIPASE 48 50 26   No results for input(s): AMMONIA in the last 168 hours. Coagulation Profile: No results for input(s): INR, PROTIME in the last 168 hours. Cardiac Enzymes: No results for input(s): CKTOTAL, CKMB, CKMBINDEX, TROPONINI in the last 168 hours. BNP (last 3 results) No results for input(s): PROBNP in the last 8760 hours. HbA1C: No results for input(s): HGBA1C in the last 72 hours. CBG: Recent Labs  Lab 01/08/21 0013 01/08/21 0428  01/08/21 0740 01/08/21 1104 01/08/21 1619  GLUCAP 227* 185* 214* 165* 167*   Lipid Profile: No results for input(s): CHOL, HDL, LDLCALC, TRIG, CHOLHDL, LDLDIRECT in the last 72 hours. Thyroid Function Tests: No results for input(s): TSH, T4TOTAL, FREET4, T3FREE, THYROIDAB in the last 72 hours. Anemia Panel: No results for input(s): VITAMINB12, FOLATE, FERRITIN, TIBC, IRON, RETICCTPCT in the last 72 hours. Urine analysis:    Component Value Date/Time   COLORURINE Yellow 10/04/2013 2025   APPEARANCEUR Clear 10/04/2013 2025   LABSPEC  1.021 10/04/2013 2025   PHURINE 5.0 10/04/2013 2025   GLUCOSEU Negative 10/04/2013 2025   HGBUR Negative 10/04/2013 2025   BILIRUBINUR Negative 10/04/2013 2025   KETONESUR Negative 10/04/2013 2025   PROTEINUR 30 mg/dL 10/04/2013 2025   NITRITE Negative 10/04/2013 2025   LEUKOCYTESUR Negative 10/04/2013 2025   Sepsis Labs: @LABRCNTIP (procalcitonin:4,lacticidven:4)  ) Recent Results (from the past 240 hour(s))  Resp Panel by RT-PCR (Flu A&B, Covid) Nasopharyngeal Swab     Status: None   Collection Time: 12/30/20  7:25 PM   Specimen: Nasopharyngeal Swab; Nasopharyngeal(NP) swabs in vial transport medium  Result Value Ref Range Status   SARS Coronavirus 2 by RT PCR NEGATIVE NEGATIVE Final    Comment: (NOTE) SARS-CoV-2 target nucleic acids are NOT DETECTED.  The SARS-CoV-2 RNA is generally detectable in upper respiratory specimens during the acute phase of infection. The lowest concentration of SARS-CoV-2 viral copies this assay can detect is 138 copies/mL. A negative result does not preclude SARS-Cov-2 infection and should not be used as the sole basis for treatment or other patient management decisions. A negative result may occur with  improper specimen collection/handling, submission of specimen other than nasopharyngeal swab, presence of viral mutation(s) within the areas targeted by this assay, and inadequate number of viral copies(<138  copies/mL). A negative result must be combined with clinical observations, patient history, and epidemiological information. The expected result is Negative.  Fact Sheet for Patients:  EntrepreneurPulse.com.au  Fact Sheet for Healthcare Providers:  IncredibleEmployment.be  This test is no t yet approved or cleared by the [place] FDA and  has been authorized for detection and/or diagnosis of SARS-CoV-2 by FDA under an Emergency Use Authorization (EUA). This EUA will remain  in effect (meaning this test can be used) for the duration of the COVID-19 declaration under Section 564(b)(1) of the Act, 21 U.S.C.section 360bbb-3(b)(1), unless the authorization is terminated  or revoked sooner.       Influenza A by PCR NEGATIVE NEGATIVE Final   Influenza B by PCR NEGATIVE NEGATIVE Final    Comment: (NOTE) The Xpert Xpress SARS-CoV-2/FLU/RSV plus assay is intended as an aid in the diagnosis of influenza from Nasopharyngeal swab specimens and should not be used as a sole basis for treatment. Nasal washings and aspirates are unacceptable for Xpert Xpress SARS-CoV-2/FLU/RSV testing.  Fact Sheet for Patients: EntrepreneurPulse.com.au  Fact Sheet for Healthcare Providers: IncredibleEmployment.be  This test is not yet approved or cleared by the [place] FDA and has been authorized for detection and/or diagnosis of SARS-CoV-2 by FDA under an Emergency Use Authorization (EUA). This EUA will remain in effect (meaning this test can be used) for the duration of the COVID-19 declaration under Section 564(b)(1) of the Act, 21 U.S.C. section 360bbb-3(b)(1), unless the authorization is terminated or revoked.  Performed at [facility], [address], [place], [place] [address]   Culture, blood (Routine X 2) w Reflex to ID Panel     Status: None (Preliminary result)   Collection Time: 01/08/21 10:12 AM   Specimen:  BLOOD RIGHT HAND  Result Value Ref Range Status   Specimen Description BLOOD RIGHT HAND  Final   Special Requests   Final    BOTTLES DRAWN AEROBIC AND ANAEROBIC Blood Culture adequate volume Performed at [facility], [address], [place], [place] [address]    Culture PENDING  Incomplete   Report Status PENDING  Incomplete  Culture, blood (Routine X 2) w Reflex to ID Panel     Status: None (Preliminary  result)   Collection Time: 01/08/21 10:13 AM   Specimen: BLOOD LEFT HAND  Result Value Ref Range Status   Specimen Description BLOOD LEFT HAND  Final   Special Requests   Final    BOTTLES DRAWN AEROBIC ONLY Blood Culture results may not be optimal due to an inadequate volume of blood received in culture bottles Performed at [facility], [address] [place], [place] [address]    Culture PENDING  Incomplete   Report Status PENDING  Incomplete      Radiology Studies: DG Chest 2 View  Result Date: 01/07/2021 CLINICAL DATA:  Chest pain and shortness of breath. EXAM: CHEST - 2 VIEW COMPARISON:  CT chest from same day.  Chest x-ray from yesterday. FINDINGS: The heart size and mediastinal contours are within normal limits. Both lungs are clear. The visualized skeletal structures are unremarkable. IMPRESSION: No active cardiopulmonary disease. Electronically Signed   By: Titus Dubin M.D.   On: 01/07/2021 12:18   DG Chest 2 View  Result Date: 01/07/2021 CLINICAL DATA:  Chest pain. EXAM: CHEST - 2 VIEW COMPARISON:  Chest radiograph dated 12/30/2020. FINDINGS: No focal consolidation, pleural effusion, pneumothorax. The cardiac silhouette is within limits. No acute osseous pathology. IMPRESSION: No active cardiopulmonary disease. Electronically Signed   By: Anner Crete M.D.   On: 01/07/2021 00:02   CT Angio Chest PE W and/or Wo Contrast  Result Date: 01/07/2021 CLINICAL DATA:  Chest pain, status post heart attack with stent placement, upper abdominal pain. EXAM: CT ANGIOGRAPHY  CHEST CT ABDOMEN AND PELVIS WITH CONTRAST TECHNIQUE: Multidetector CT imaging of the chest was performed using the standard protocol during bolus administration of intravenous contrast. Multiplanar CT image reconstructions and MIPs were obtained to evaluate the vascular anatomy. Multidetector CT imaging of the abdomen and pelvis was performed using the standard protocol during bolus administration of intravenous contrast. CONTRAST:  192mL OMNIPAQUE IOHEXOL 350 MG/ML SOLN COMPARISON:  CT abdomen/pelvis dated 01/03/2021. CTA chest abdomen pelvis dated 12/30/2020. FINDINGS: CTA CHEST FINDINGS Cardiovascular: Satisfactory opacification of the bilateral pulmonary arteries to the segmental level. No evidence of pulmonary embolism. Although not tailored for evaluation of the thoracic aorta, there is no evidence of thoracic aortic aneurysm or dissection. The heart is normal in size.  No pericardial effusion. Coronary stent in the 1st diagonal. Coronary atherosclerosis of the right coronary artery. Mediastinum/Nodes: No suspicious mediastinal lymphadenopathy. Visualized thyroid is unremarkable. Lungs/Pleura: Mild dependent atelectasis in the bilateral lower lobes. No focal consolidation. No suspicious pulmonary nodules. No pleural effusion or pneumothorax. Musculoskeletal: Degenerative changes of the thoracic spine. Review of the MIP images confirms the above findings. CT ABDOMEN and PELVIS FINDINGS Hepatobiliary: Liver is within normal limits. Gallbladder is unremarkable. No intrahepatic or extrahepatic ductal dilatation. Pancreas: Within normal limits. Spleen: Within normal limits. Adrenals/Urinary Tract: Adrenal glands are within normal limits. Subcentimeter right renal cysts. Left kidney is within normal limits. No hydronephrosis. Bladder is within normal limits. Stomach/Bowel: Stomach is within normal limits No evidence of bowel obstruction. Normal appendix (series 2/image 69). No colonic wall thickening or inflammatory  changes. Vascular/Lymphatic: No evidence of abdominal aortic aneurysm. Atherosclerotic calcifications of the abdominal aorta and branch vessels. No suspicious abdominopelvic lymphadenopathy. Reproductive: Mild prostatomegaly. Other: No abdominopelvic ascites. Musculoskeletal: Visualized osseous structures are within normal limits. Review of the MIP images confirms the above findings. IMPRESSION: No evidence of pulmonary embolism. No acute findings in the chest, abdomen, or pelvis. No interval change from recent CTs. Electronically Signed   By: Julian Hy  M.D.   On: 01/07/2021 01:14   NM Hepatobiliary Liver Func  Result Date: 01/08/2021 CLINICAL DATA:  Upper abdominal pain, RIGHT upper quadrant pain, nausea, vomiting EXAM: NUCLEAR MEDICINE HEPATOBILIARY IMAGING TECHNIQUE: Sequential images of the abdomen were obtained out to 60 minutes following intravenous administration of radiopharmaceutical. RADIOPHARMACEUTICALS:  5.3 mCi Tc-49m  Choletec IV COMPARISON:  CT abdomen and pelvis 01/07/2021, ultrasound RIGHT upper quadrant 01/07/2021 FINDINGS: Prompt tracer extraction from bloodstream indicating normal hepatocellular function. Prompt excretion of tracer into biliary tree. Small bowel visualized at 16 minutes. At 1 hour gallbladder had not visualized. Patient then received 3 mg of morphine IV and imaging was continued for 30 minutes. Gallbladder failed to visualize following morphine administration. Findings are consistent with cystic duct obstruction and acute cholecystitis. IMPRESSION: Patent CBD. Nonvisualization of gallbladder despite morphine administration consistent with acute cholecystitis. Electronically Signed   By: Lavonia Dana M.D.   On: 01/08/2021 15:03   CT ABDOMEN PELVIS W CONTRAST  Result Date: 01/07/2021 CLINICAL DATA:  Chest pain, status post heart attack with stent placement, upper abdominal pain. EXAM: CT ANGIOGRAPHY CHEST CT ABDOMEN AND PELVIS WITH CONTRAST TECHNIQUE: Multidetector  CT imaging of the chest was performed using the standard protocol during bolus administration of intravenous contrast. Multiplanar CT image reconstructions and MIPs were obtained to evaluate the vascular anatomy. Multidetector CT imaging of the abdomen and pelvis was performed using the standard protocol during bolus administration of intravenous contrast. CONTRAST:  129mL OMNIPAQUE IOHEXOL 350 MG/ML SOLN COMPARISON:  CT abdomen/pelvis dated 01/03/2021. CTA chest abdomen pelvis dated 12/30/2020. FINDINGS: CTA CHEST FINDINGS Cardiovascular: Satisfactory opacification of the bilateral pulmonary arteries to the segmental level. No evidence of pulmonary embolism. Although not tailored for evaluation of the thoracic aorta, there is no evidence of thoracic aortic aneurysm or dissection. The heart is normal in size.  No pericardial effusion. Coronary stent in the 1st diagonal. Coronary atherosclerosis of the right coronary artery. Mediastinum/Nodes: No suspicious mediastinal lymphadenopathy. Visualized thyroid is unremarkable. Lungs/Pleura: Mild dependent atelectasis in the bilateral lower lobes. No focal consolidation. No suspicious pulmonary nodules. No pleural effusion or pneumothorax. Musculoskeletal: Degenerative changes of the thoracic spine. Review of the MIP images confirms the above findings. CT ABDOMEN and PELVIS FINDINGS Hepatobiliary: Liver is within normal limits. Gallbladder is unremarkable. No intrahepatic or extrahepatic ductal dilatation. Pancreas: Within normal limits. Spleen: Within normal limits. Adrenals/Urinary Tract: Adrenal glands are within normal limits. Subcentimeter right renal cysts. Left kidney is within normal limits. No hydronephrosis. Bladder is within normal limits. Stomach/Bowel: Stomach is within normal limits No evidence of bowel obstruction. Normal appendix (series 2/image 69). No colonic wall thickening or inflammatory changes. Vascular/Lymphatic: No evidence of abdominal aortic  aneurysm. Atherosclerotic calcifications of the abdominal aorta and branch vessels. No suspicious abdominopelvic lymphadenopathy. Reproductive: Mild prostatomegaly. Other: No abdominopelvic ascites. Musculoskeletal: Visualized osseous structures are within normal limits. Review of the MIP images confirms the above findings. IMPRESSION: No evidence of pulmonary embolism. No acute findings in the chest, abdomen, or pelvis. No interval change from recent CTs. Electronically Signed   By: Julian Hy M.D.   On: 01/07/2021 01:14   US Abdomen Limited RUQ (LIVER/GB)  Result Date: 01/07/2021 CLINICAL DATA:  5 day history of epigastric pain. EXAM: ULTRASOUND ABDOMEN LIMITED RIGHT UPPER QUADRANT COMPARISON:  None. FINDINGS: Gallbladder: Gallbladder wall is thickened up to 5 mm. There is dependent nonshadowing sludge and probable tiny stones. No pericholecystic fluid. Sonographer reports no sonographic Murphy sign. Common bile duct: Diameter: 4-5 mm Liver: No focal  lesion identified. Within normal limits in parenchymal echogenicity. Portal vein is patent on color Doppler imaging with normal direction of blood flow towards the liver. Other: None. IMPRESSION: Gallbladder wall thickening with dependent sludge and probable tiny stones. No pericholecystic fluid or sonographic Murphy sign. No biliary dilatation. Nuclear scintigraphy may prove helpful to further evaluate if cystic duct obstruction is a clinical concern. Electronically Signed   By: Misty Stanley M.D.   On: 01/07/2021 16:01     Scheduled Meds:  amLODipine  5 mg Oral Daily   aspirin EC  81 mg Oral Daily   heparin  5,000 Units Subcutaneous Q8H   insulin aspart  0-9 Units Subcutaneous Q4H   isosorbide mononitrate  30 mg Oral Daily   lisinopril  20 mg Oral Daily   metoprolol tartrate  25 mg Oral BID   pantoprazole (PROTONIX) IV  40 mg Intravenous Q12H   ticagrelor  90 mg Oral BID   Continuous Infusions:  cefTRIAXone (ROCEPHIN)  IV       LOS: 0  days    Roxan Hockey M.D on 01/08/2021 at 5:10 PM  Go to [email or web address] - for contact info  [facility] - Office  [phone]  If 7PM-7AM, please contact night-coverage [email or web address] Password [email or web address] 01/08/2021, 5:10 PM

## 2021-01-09 DIAGNOSIS — K810 Acute cholecystitis: Principal | ICD-10-CM

## 2021-01-09 DIAGNOSIS — Z955 Presence of coronary angioplasty implant and graft: Secondary | ICD-10-CM

## 2021-01-09 LAB — COMPREHENSIVE METABOLIC PANEL
ALT: 54 U/L — ABNORMAL HIGH (ref 0–44)
AST: 15 U/L (ref 15–41)
Albumin: 3.1 g/dL — ABNORMAL LOW (ref 3.5–5.0)
Alkaline Phosphatase: 108 U/L (ref 38–126)
Anion gap: 12 (ref 5–15)
BUN: 14 mg/dL (ref 6–20)
CO2: 23 mmol/L (ref 22–32)
Calcium: 8.6 mg/dL — ABNORMAL LOW (ref 8.9–10.3)
Chloride: 102 mmol/L (ref 98–111)
Creatinine, Ser: 0.95 mg/dL (ref 0.61–1.24)
GFR, Estimated: >60 mL/min (ref >60)
Glucose, Bld: 218 mg/dL — ABNORMAL HIGH (ref 70–99)
Potassium: 3.2 mmol/L — ABNORMAL LOW (ref 3.5–5.1)
Sodium: 137 mmol/L (ref 135–145)
Total Bilirubin: 1.3 mg/dL — ABNORMAL HIGH (ref 0.3–1.2)
Total Protein: 6.6 g/dL (ref 6.5–8.1)

## 2021-01-09 LAB — CBC
HCT: 30.9 % — ABNORMAL LOW (ref 39.0–52.0)
Hemoglobin: 10.5 g/dL — ABNORMAL LOW (ref 13.0–17.0)
MCH: 28.1 pg (ref 26.0–34.0)
MCHC: 34 g/dL (ref 30.0–36.0)
MCV: 82.6 fL (ref 80.0–100.0)
Platelets: 227 10*3/uL (ref 150–400)
RBC: 3.74 MIL/uL — ABNORMAL LOW (ref 4.22–5.81)
RDW: 13.5 % (ref 11.5–15.5)
WBC: 19.1 10*3/uL — ABNORMAL HIGH (ref 4.0–10.5)
nRBC: 0 % (ref 0.0–0.2)

## 2021-01-09 LAB — GLUCOSE, CAPILLARY
Glucose-Capillary: 210 mg/dL — ABNORMAL HIGH (ref 70–99)
Glucose-Capillary: 227 mg/dL — ABNORMAL HIGH (ref 70–99)
Glucose-Capillary: 189 mg/dL — ABNORMAL HIGH (ref 70–99)
Glucose-Capillary: 198 mg/dL — ABNORMAL HIGH (ref 70–99)

## 2021-01-09 MED ORDER — SODIUM CHLORIDE 0.9 % IV SOLN: INTRAVENOUS | Status: AC

## 2021-01-09 MED ORDER — HYDROMORPHONE HCL 1 MG/ML IJ SOLN
1.0000 mg | INTRAMUSCULAR | Status: DC | PRN
  Administered 2021-01-09 – 2021-01-12 (×12): 1 mg via INTRAVENOUS
  Filled 2021-01-09 (×13): qty 1

## 2021-01-09 MED ORDER — POTASSIUM CHLORIDE CRYS ER 20 MEQ PO TBCR
40.0000 meq | EXTENDED_RELEASE_TABLET | ORAL | Status: AC
  Administered 2021-01-09 (×2): 40 meq via ORAL
  Filled 2021-01-09 (×2): qty 2

## 2021-01-09 MED ORDER — SODIUM CHLORIDE 0.9 % IV SOLN: INTRAVENOUS | Status: DC | PRN

## 2021-01-09 MED ORDER — ONDANSETRON HCL 4 MG PO TABS
4.0000 mg | ORAL_TABLET | Freq: Four times a day (QID) | ORAL | Status: DC | PRN
  Filled 2021-01-09: qty 1

## 2021-01-09 MED ORDER — ONDANSETRON HCL 4 MG/2ML IJ SOLN
4.0000 mg | INTRAMUSCULAR | Status: DC | PRN
  Administered 2021-01-09: 4 mg via INTRAVENOUS
  Filled 2021-01-09: qty 2

## 2021-01-09 NOTE — Progress Notes (Signed)
Initial Nutrition Assessment  DOCUMENTATION CODES:   Not applicable  INTERVENTION:  Ensure Max BID  Recommend: Heart Healthy/CHO modified diet as tolerated  NUTRITION DIAGNOSIS:   Inadequate oral intake related to poor appetite, acute illness (cholecystitis) as evidenced by meal completion < 25%, per patient/family report.   GOAL:  Patient will meet greater than or equal to 90% of their needs   MONITOR:  Diet advancement, PO intake, Supplement acceptance, Weight trends  REASON FOR ASSESSMENT:   Malnutrition Screening Tool    ASSESSMENT: A 50 yo male with hx of CHF, GERD, CAD, NSTEMI, HTN, DM. Presents with abdominal pain and chest pain, acute cholecystitis.   Patient receiving full liquids with 0% intake since admission per chart. Talked with patient about the importance of increasing protein and energy intake. He is agreeable to drink high protein ONS while diet is being advanced.  Medications: Insulin, Zofran, Protonix, Klor-Con.   Patient weights reviewed. Skin is intact.   Labs:   BMP Latest Ref Rng & Units 01/10/2021 01/09/2021 01/08/2021  Glucose 70 - 99 mg/dL 236(H) 218(H) 197(H)  BUN 6 - 20 mg/dL 14 14 10   Creatinine 0.61 - 1.24 mg/dL 0.91 0.95 0.83  BUN/Creat Ratio 9 - 20 - - -  Sodium 135 - 145 mmol/L 138 137 137  Potassium 3.5 - 5.1 mmol/L 3.6 3.2(L) 3.7  Chloride 98 - 111 mmol/L 108 102 103  CO2 22 - 32 mmol/L 19(L) 23 22  Calcium 8.9 - 10.3 mg/dL 8.3(L) 8.6(L) 8.7(L)    CBG (last 3) A1C-7.4%  Recent Labs    01/09/21 2022 01/10/21 0009 01/10/21 0715  GLUCAP 198* 204* 225*    Diet Order:   Diet Order             Diet full liquid Room service appropriate? Yes; Fluid consistency: Thin  Diet effective now                   EDUCATION NEEDS:  Education needs have been addressed  Skin:  Skin Assessment: Reviewed RN Assessment  Last BM:  12/13  Height:   Ht Readings from Last 1 Encounters:  01/07/21 6\' 1"  (1.854 m)    Weight:    Wt Readings from Last 1 Encounters:  01/07/21 88.5 kg    Ideal Body Weight:   84 kg  BMI:  Body mass index is 25.73 kg/m.  Estimated Nutritional Needs:   Kcal:  2200-2400  Protein:  107-116 gr  Fluid:  < 2 liters daily   Colman Cater MS,RD,CSG,LDN Contact: Shea Evans

## 2021-01-09 NOTE — Progress Notes (Signed)
Inpatient Diabetes Program Recommendations  AACE/ADA: New Consensus Statement on Inpatient Glycemic Control   Target Ranges:  Prepandial:   less than 140 mg/dL      Peak postprandial:   less than 180 mg/dL (1-2 hours)      Critically ill patients:  140 - 180 mg/dL    Latest Reference Range & Units 01/09/21 07:07  Glucose-Capillary 70 - 99 mg/dL 210 (H)      Latest Reference Range & Units 01/08/21 07:40 01/08/21 11:04 01/08/21 16:19 01/08/21 20:40  Glucose-Capillary 70 - 99 mg/dL 214 (H)  Novolog 3 units 165 (H) 167 (H)  Novolog 2 units 210 (H)  Novolog 2 units   Review of Glycemic Control  Diabetes history: DM2 Outpatient Diabetes medications: Glipizide XL 10 mg QAM, Farxiga 10 mg daily, Metformin 850 mg BID Current orders for Inpatient glycemic control: Novolog 0-9 units TID with meals   Inpatient Diabetes Program Recommendations:     Insulin: Per chart, 0% of meals eaten yesterday and glucose ranged from 165-214 mg/dl. Fasting glucose 210 mg today.  Please consider ordering Semglee 5 units Q24H.   Thanks Barnie Alderman, RN, MSN, CDE Diabetes Coordinator Inpatient Diabetes Program [phone] (Team Pager from 8am to 5pm)

## 2021-01-09 NOTE — Progress Notes (Signed)
PROGRESS NOTE     Jacob Baker, is a 50 y.o. male, DOB - 01-08-1971, GUY:[id number]  Admit date - 01/07/2021   Admitting Physician Kordae Buonocore Denton Brick, MD  Outpatient Primary MD for the patient is [name], MD  LOS - 1  Chief Complaint  Patient presents with   Chest Pain       Brief Narrative:  50 y.o. male with medical history significant for  CAD, HTN, DM. Presented to the ED with persistent abdominal pain that started after he was hospitalized for NSTEMI and underwent cath 2ce- 12/6 and 01/01/21 admitted on 01/07/2021 with right upper quadrant pain and found to have acute cholecystitis  Assessment & Plan:   Principal Problem:   Acute cholecystitis Active Problems:   HTN (hypertension)   Essential hypertension   Hyperglycemia due to diabetes mellitus ([place])   Status post coronary artery stent placement   Intractable abdominal pain   Colicky RUQ abdominal pain   Nausea without vomiting   Elevated LFTs   1) acute cholecystitis--patient presented with right upper quadrant pain as well as leukocytosis and nausea RUQ ultrasound consistent with gallbladder wall thickening, HIDA scan consistent with acute cholecystitis -GI and general surgery input appreciated -not a great candidate for surgery given recent LHC with DAPT -IV Rocephin and Flagyl for now -WBC down to 19.1 from 23.8 -Patient with chills  2)CAD--- recent NSTEMI with angioplasty and stents x 2 placement,s/p LHC 12/31/20 and 01/01/21 -Continue aspirin and Brilinta  3)DM2-A1c 7.4 reflecting uncontrolled DM with hyperglycemia PTA -Continue to hold metformin, Farxiga and glipizide Use Novolog/Humalog Sliding scale insulin with Accu-Cheks/Fingersticks as ordered   Disposition/Need for in-Hospital Stay- patient unable to be discharged at this time due to --acute cholecystitis requiring IV antibiotics, not a great candidate for surgery given recent LHC with DAPT  Status is: Inpatient  Remains inpatient  appropriate because: See disposition above  Disposition: The patient is from: Home              Anticipated d/c is to: Home              Anticipated d/c date is: 2 days              Patient currently is not medically stable to d/c. Barriers: Not Clinically Stable-   Code Status :  -  Code Status: Full Code   Family Communication:    (patient is alert, awake and coherent)  -Called and updated patient's wife by phone Consults  :  Gi/Gen surgery  DVT Prophylaxis  :   - SCDs   heparin injection 5,000 Units Start: 01/07/21 2200  Lab Results  Component Value Date   PLT 227 01/09/2021    Inpatient Medications  Scheduled Meds:  amLODipine  5 mg Oral Daily   aspirin EC  81 mg Oral Daily   heparin  5,000 Units Subcutaneous Q8H   insulin aspart  0-5 Units Subcutaneous QHS   insulin aspart  0-9 Units Subcutaneous TID WC   isosorbide mononitrate  30 mg Oral Daily   lisinopril  20 mg Oral Daily   metoprolol tartrate  25 mg Oral BID   pantoprazole (PROTONIX) IV  40 mg Intravenous Q12H   ticagrelor  90 mg Oral BID   Continuous Infusions:  sodium chloride     sodium chloride 125 mL/hr at 01/09/21 1220   cefTRIAXone (ROCEPHIN)  IV 2 g (01/09/21 1759)   metronidazole 500 mg (01/09/21 0948)   PRN Meds:.sodium chloride, acetaminophen **OR** acetaminophen, HYDROmorphone (  DILAUDID) injection, labetalol, ondansetron **OR** ondansetron (ZOFRAN) IV, polyethylene glycol   Anti-infectives (From admission, onward)    Start     Dose/Rate Route Frequency Ordered Stop   01/08/21 2000  metroNIDAZOLE (FLAGYL) IVPB 500 mg        500 mg 100 mL/hr over 60 Minutes Intravenous Every 12 hours 01/08/21 1721     01/08/21 1800  cefTRIAXone (ROCEPHIN) 2 g in sodium chloride 0.9 % 100 mL IVPB        2 g 200 mL/hr over 30 Minutes Intravenous Every 24 hours 01/08/21 1652           Subjective: Jacob Baker today has no fevers, no emesis,  No chest pain,   -Nausea and abdominal pain persist -Patient has  chills   Objective: Vitals:   01/08/21 2042 01/09/21 0409 01/09/21 0945 01/09/21 1606  BP: 122/72 130/78 130/81 116/78  Pulse: 93 100 94 (!) 101  Resp: 19 20  20   Temp: 98.2 F (36.8 C) 98.2 F (36.8 C)  98.7 F (37.1 C)  TempSrc:  Oral  Oral  SpO2: 97% 96%  96%  Weight:      Height:        Intake/Output Summary (Last 24 hours) at 01/09/2021 1922 Last data filed at 01/09/2021 1627 Gross per 24 hour  Intake 1656.97 ml  Output 1000 ml  Net 656.97 ml   Filed Weights   01/07/21 1123  Weight: 88.5 kg   Physical Exam  Gen:- Awake Alert,  in no apparent distress  HEENT:- [place].AT, No sclera icterus Neck-Supple Neck,No JVD,.  Lungs-  CTAB , fair symmetrical air movement CV- S1, S2 normal, regular  Abd-  +ve B.Sounds, Abd Soft, right upper quadrant tenderness Extremity/Skin:- No  edema, pedal pulses present  Psych-affect is appropriate, oriented x3 Neuro-no new focal deficits, no tremors  Data Reviewed: I have personally reviewed following labs and imaging studies  CBC: Recent Labs  Lab 01/03/21 0542 01/06/21 2331 01/07/21 1142 01/08/21 0457 01/09/21 0725  WBC 13.5* 13.0* 19.0* 23.8* 19.1*  HGB 13.0 12.8* 13.1 12.4* 10.5*  HCT 36.5* 36.5* 37.2* 35.9* 30.9*  MCV 82.8 82.8 82.7 82.3 82.6  PLT 222 253 265 248 161   Basic Metabolic Panel: Recent Labs  Lab 01/03/21 0542 01/06/21 2331 01/07/21 1142 01/08/21 0457 01/09/21 0725  NA 136 137 137 137 137  K 3.7 3.6 3.9 3.7 3.2*  CL 105 106 102 103 102  CO2 23 22 23 22 23   GLUCOSE 175* 165* 224* 197* 218*  BUN 23* 12 11 10 14   CREATININE 0.99 0.95 0.88 0.83 0.95  CALCIUM 8.5* 8.9 9.3 8.7* 8.6*   GFR: Estimated Creatinine Clearance: 102.8 mL/min (by C-G formula based on SCr of 0.95 mg/dL). Liver Function Tests: Recent Labs  Lab 01/03/21 0542 01/06/21 2331 01/07/21 1316 01/09/21 0725  AST 16 15 36 15  ALT 17 30 75* 54*  ALKPHOS 91 91 119 108  BILITOT 1.0 0.8 1.6* 1.3*  PROT 6.8 7.1 7.2 6.6  ALBUMIN 4.0  4.1 4.3 3.1*   Recent Labs  Lab 01/03/21 0542 01/06/21 2331 01/07/21 1316  LIPASE 48 50 26   No results for input(s): AMMONIA in the last 168 hours. Coagulation Profile: No results for input(s): INR, PROTIME in the last 168 hours. Cardiac Enzymes: No results for input(s): CKTOTAL, CKMB, CKMBINDEX, TROPONINI in the last 168 hours. BNP (last 3 results) No results for input(s): PROBNP in the last 8760 hours. HbA1C: No results for input(s): HGBA1C in  the last 72 hours. CBG: Recent Labs  Lab 01/08/21 1619 01/08/21 2040 01/09/21 0707 01/09/21 1104 01/09/21 1604  GLUCAP 167* 210* 210* 227* 189*   Lipid Profile: No results for input(s): CHOL, HDL, LDLCALC, TRIG, CHOLHDL, LDLDIRECT in the last 72 hours. Thyroid Function Tests: No results for input(s): TSH, T4TOTAL, FREET4, T3FREE, THYROIDAB in the last 72 hours. Anemia Panel: No results for input(s): VITAMINB12, FOLATE, FERRITIN, TIBC, IRON, RETICCTPCT in the last 72 hours. Urine analysis:    Component Value Date/Time   COLORURINE Yellow 10/04/2013 2025   APPEARANCEUR Clear 10/04/2013 2025   LABSPEC 1.021 10/04/2013 2025   PHURINE 5.0 10/04/2013 2025   GLUCOSEU Negative 10/04/2013 2025   HGBUR Negative 10/04/2013 2025   BILIRUBINUR Negative 10/04/2013 2025   KETONESUR Negative 10/04/2013 2025   PROTEINUR 30 mg/dL 10/04/2013 2025   NITRITE Negative 10/04/2013 2025   LEUKOCYTESUR Negative 10/04/2013 2025   Sepsis Labs: @LABRCNTIP (procalcitonin:4,lacticidven:4)  ) Recent Results (from the past 240 hour(s))  Resp Panel by RT-PCR (Flu A&B, Covid) Nasopharyngeal Swab     Status: None   Collection Time: 12/30/20  7:25 PM   Specimen: Nasopharyngeal Swab; Nasopharyngeal(NP) swabs in vial transport medium  Result Value Ref Range Status   SARS Coronavirus 2 by RT PCR NEGATIVE NEGATIVE Final    Comment: (NOTE) SARS-CoV-2 target nucleic acids are NOT DETECTED.  The SARS-CoV-2 RNA is generally detectable in upper  respiratory specimens during the acute phase of infection. The lowest concentration of SARS-CoV-2 viral copies this assay can detect is 138 copies/mL. A negative result does not preclude SARS-Cov-2 infection and should not be used as the sole basis for treatment or other patient management decisions. A negative result may occur with  improper specimen collection/handling, submission of specimen other than nasopharyngeal swab, presence of viral mutation(s) within the areas targeted by this assay, and inadequate number of viral copies(<138 copies/mL). A negative result must be combined with clinical observations, patient history, and epidemiological information. The expected result is Negative.  Fact Sheet for Patients:  EntrepreneurPulse.com.au  Fact Sheet for Healthcare Providers:  IncredibleEmployment.be  This test is no t yet approved or cleared by the [place] FDA and  has been authorized for detection and/or diagnosis of SARS-CoV-2 by FDA under an Emergency Use Authorization (EUA). This EUA will remain  in effect (meaning this test can be used) for the duration of the COVID-19 declaration under Section 564(b)(1) of the Act, 21 U.S.C.section 360bbb-3(b)(1), unless the authorization is terminated  or revoked sooner.       Influenza A by PCR NEGATIVE NEGATIVE Final   Influenza B by PCR NEGATIVE NEGATIVE Final    Comment: (NOTE) The Xpert Xpress SARS-CoV-2/FLU/RSV plus assay is intended as an aid in the diagnosis of influenza from Nasopharyngeal swab specimens and should not be used as a sole basis for treatment. Nasal washings and aspirates are unacceptable for Xpert Xpress SARS-CoV-2/FLU/RSV testing.  Fact Sheet for Patients: EntrepreneurPulse.com.au  Fact Sheet for Healthcare Providers: IncredibleEmployment.be  This test is not yet approved or cleared by the [place] FDA and has been  authorized for detection and/or diagnosis of SARS-CoV-2 by FDA under an Emergency Use Authorization (EUA). This EUA will remain in effect (meaning this test can be used) for the duration of the COVID-19 declaration under Section 564(b)(1) of the Act, 21 U.S.C. section 360bbb-3(b)(1), unless the authorization is terminated or revoked.  Performed at [facility], [address], [place], [place] [address]   Culture, blood (Routine X 2) w  Reflex to ID Panel     Status: None (Preliminary result)   Collection Time: 01/08/21 10:12 AM   Specimen: BLOOD RIGHT HAND  Result Value Ref Range Status   Specimen Description BLOOD RIGHT HAND  Final   Special Requests   Final    BOTTLES DRAWN AEROBIC AND ANAEROBIC Blood Culture adequate volume Performed at [facility], [address] [place], [place] [address]    Culture PENDING  Incomplete   Report Status PENDING  Incomplete  Culture, blood (Routine X 2) w Reflex to ID Panel     Status: None (Preliminary result)   Collection Time: 01/08/21 10:13 AM   Specimen: BLOOD LEFT HAND  Result Value Ref Range Status   Specimen Description BLOOD LEFT HAND  Final   Special Requests   Final    BOTTLES DRAWN AEROBIC ONLY Blood Culture results may not be optimal due to an inadequate volume of blood received in culture bottles Performed at [facility], [address] [place], [place] [address]    Culture PENDING  Incomplete   Report Status PENDING  Incomplete      Radiology Studies: NM Hepatobiliary Liver Func  Result Date: 01/08/2021 CLINICAL DATA:  Upper abdominal pain, RIGHT upper quadrant pain, nausea, vomiting EXAM: NUCLEAR MEDICINE HEPATOBILIARY IMAGING TECHNIQUE: Sequential images of the abdomen were obtained out to 60 minutes following intravenous administration of radiopharmaceutical. RADIOPHARMACEUTICALS:  5.3 mCi Tc-30m  Choletec IV COMPARISON:  CT abdomen and pelvis 01/07/2021, ultrasound RIGHT upper quadrant 01/07/2021 FINDINGS: Prompt tracer  extraction from bloodstream indicating normal hepatocellular function. Prompt excretion of tracer into biliary tree. Small bowel visualized at 16 minutes. At 1 hour gallbladder had not visualized. Patient then received 3 mg of morphine IV and imaging was continued for 30 minutes. Gallbladder failed to visualize following morphine administration. Findings are consistent with cystic duct obstruction and acute cholecystitis. IMPRESSION: Patent CBD. Nonvisualization of gallbladder despite morphine administration consistent with acute cholecystitis. Electronically Signed   By: Lavonia Dana M.D.   On: 01/08/2021 15:03     Scheduled Meds:  amLODipine  5 mg Oral Daily   aspirin EC  81 mg Oral Daily   heparin  5,000 Units Subcutaneous Q8H   insulin aspart  0-5 Units Subcutaneous QHS   insulin aspart  0-9 Units Subcutaneous TID WC   isosorbide mononitrate  30 mg Oral Daily   lisinopril  20 mg Oral Daily   metoprolol tartrate  25 mg Oral BID   pantoprazole (PROTONIX) IV  40 mg Intravenous Q12H   ticagrelor  90 mg Oral BID   Continuous Infusions:  sodium chloride     sodium chloride 125 mL/hr at 01/09/21 1220   cefTRIAXone (ROCEPHIN)  IV 2 g (01/09/21 1759)   metronidazole 500 mg (01/09/21 0948)     LOS: 1 day    Roxan Hockey M.D on 01/09/2021 at 7:22 PM  Go to [email or web address] - for contact info  [facility] - Office  [phone]  If 7PM-7AM, please contact night-coverage [email or web address] Password [email or web address] 01/09/2021, 7:22 PM

## 2021-01-09 NOTE — Consult Note (Signed)
[facility] Consult  Reason for Consult: Acute cholecystitis  Referring Physician:  Dr. Denton Brick  Chief Complaint   Chest Pain     HPI: Jacob Baker is a 50 y.o. male with CAD, HTN, DM, recent NSTEMI 12/5 s/p stent placement who comes into the hospital with worsening RUQ pain and associated nausea but no vomiting. The pain is worse now that when it started but is not associated with food. The pain is different than his cardiac pain. He had been to the ED 3 times in 5 days after his discharge and received CT a/p without major issues and down trending troponin so he was sent home. He came back and had a [place] that was indeterminate for cholecystitis and HIDA done yesterday that demonstrated cholecystitis. I was consulted. He says he still having RUQ pain but is tolerating a diet. He is worried about having the pain again.   Past Medical History:  Diagnosis Date   CHF (congestive heart failure) (HCC)    Diabetes mellitus without complication ([place])    DVT (deep venous thrombosis) ([place]) 04/2017   GERD (gastroesophageal reflux disease)    History of migraine    none in over 2 yrs   Hyperlipidemia    Hypertension    Syncope and collapse    x1 - approx 2-3 yrs ago - dehydration    Past Surgical History:  Procedure Laterality Date   COLONOSCOPY     COLONOSCOPY WITH PROPOFOL N/A 08/06/2017   Procedure: COLONOSCOPY WITH PROPOFOL;  Surgeon: Lucilla Lame, MD;  Location: [place];  Service: Endoscopy;  Laterality: N/A;  Diabetic - oral meds   CORONARY STENT INTERVENTION N/A 12/31/2020   Procedure: CORONARY STENT INTERVENTION;  Surgeon: Belva Crome, MD;  Location: [place] [facility];  Service: Cardiovascular;  Laterality: N/A;   CORONARY STENT INTERVENTION N/A 01/01/2021   Procedure: CORONARY STENT INTERVENTION;  Surgeon: [name], MD;  Location: [place] [facility];  Service: Cardiovascular;  Laterality: N/A;   ESOPHAGEAL DILATION  09/24/2016   Procedure:  ESOPHAGEAL DILATION;  Surgeon: Lucilla Lame, MD;  Location: [place];  Service: Gastroenterology;;   ESOPHAGOGASTRODUODENOSCOPY (EGD) WITH PROPOFOL N/A 04/08/2015   Procedure: ESOPHAGOGASTRODUODENOSCOPY (EGD) WITH PROPOFOL with dialation;  Surgeon: Lucilla Lame, MD;  Location: [name];  Service: Endoscopy;  Laterality: N/A;  diabetic - oral meds   ESOPHAGOGASTRODUODENOSCOPY (EGD) WITH PROPOFOL N/A 09/24/2016   Procedure: ESOPHAGOGASTRODUODENOSCOPY (EGD) WITH PROPOFOL WITH DILATION;  Surgeon: Lucilla Lame, MD;  Location: [place];  Service: Gastroenterology;  Laterality: N/A;  Diabetic - oral meds   FINGER AMPUTATION  2009   partial removal left index finger    LEFT HEART CATH AND CORONARY ANGIOGRAPHY N/A 12/31/2020   Procedure: LEFT HEART CATH AND CORONARY ANGIOGRAPHY;  Surgeon: Belva Crome, MD;  Location: [place] [facility];  Service: Cardiovascular;  Laterality: N/A;   LEFT HEART CATH AND CORONARY ANGIOGRAPHY N/A 01/01/2021   Procedure: LEFT HEART CATH AND CORONARY ANGIOGRAPHY;  Surgeon: [name], MD;  Location: [place] [facility];  Service: Cardiovascular;  Laterality: N/A;   POLYPECTOMY  08/06/2017   Procedure: POLYPECTOMY INTESTINAL;  Surgeon: Lucilla Lame, MD;  Location: [place];  Service: Endoscopy;;    Family History  Problem Relation Age of Onset   Heart disease Father        CAD   Hypertension Father    Diabetes Father    Hypertension Sister    Hypertension Brother  Hypertension Brother    Hypertension Mother     Social History   Tobacco Use   Smoking status: Every Day    Packs/day: 2.00    Years: 27.00    Pack years: 54.00    Types: Cigarettes   Smokeless tobacco: Never  Vaping Use   Vaping Use: Never used  Substance Use Topics   Alcohol use: No   Drug use: No    Medications: I have reviewed the patient's current medications. Prior to Admission:  Medications Prior to Admission  Medication Sig Dispense  Refill Last Dose   amLODipine (NORVASC) 5 MG tablet Take 1 tablet (5 mg total) by mouth daily. 30 tablet 11 01/07/2021   aspirin 81 MG EC tablet Take 1 tablet (81 mg total) by mouth daily. Swallow whole. 90 tablet 3 01/07/2021   bisacodyl (DULCOLAX) 10 MG suppository Place 1 suppository (10 mg total) rectally as needed for moderate constipation. 12 suppository 0    cyanocobalamin 1000 MCG tablet Take 100 mcg by mouth daily.      dapagliflozin propanediol (FARXIGA) 10 MG TABS tablet Take 1 tablet (10 mg total) by mouth daily. 30 tablet 11 01/07/2021   glipiZIDE (GLUCOTROL XL) 5 MG 24 hr tablet Take 2 tablets (10 mg total) by mouth daily with breakfast. 180 tablet 1 01/07/2021   glucose blood (ONETOUCH VERIO) test strip Test sugars twice daily 50 each 6    isosorbide mononitrate (IMDUR) 30 MG 24 hr tablet Take 1 tablet (30 mg total) by mouth daily. 30 tablet 0    lisinopril (ZESTRIL) 20 MG tablet Take 1 tablet (20 mg total) by mouth daily. 30 tablet 11 01/07/2021   metFORMIN (GLUCOPHAGE) 850 MG tablet TAKE 1 TABLET(850 MG) BY MOUTH TWICE DAILY WITH A MEAL. 180 tablet 1 01/07/2021   metoprolol tartrate (LOPRESSOR) 25 MG tablet Take 1 tablet (25 mg total) by mouth 2 (two) times daily. 60 tablet 11 01/07/2021   nicotine (NICODERM CQ - DOSED IN MG/24 HOURS) 21 mg/24hr patch Place 1 patch (21 mg total) onto the skin daily. 30 patch 2    nitroGLYCERIN (NITROSTAT) 0.4 MG SL tablet Place 1 tablet (0.4 mg total) under the tongue every 5 (five) minutes as needed for chest pain. 25 tablet 1 unk at [employer] LANCETS 88F MISC Test sugars twice daily 50 each 3    pantoprazole (PROTONIX) 40 MG tablet TAKE 1 TABLET(40 MG) BY MOUTH DAILY 90 tablet 1 01/07/2021   polyethylene glycol (MIRALAX) 17 g packet Take 17 g by mouth daily. 30 each 1    rosuvastatin (CRESTOR) 40 MG tablet Take 1 tablet (40 mg total) by mouth daily. 90 tablet 3 Past Week   ticagrelor (BRILINTA) 90 MG TABS tablet Take 1 tablet (90 mg  total) by mouth 2 (two) times daily. 60 tablet 11 01/07/2021 at 0900   albuterol (VENTOLIN HFA) 108 (90 Base) MCG/ACT inhaler Inhale 2 puffs into the lungs every 6 (six) hours as needed for wheezing or shortness of breath. (Patient not taking: Reported on 12/31/2020) 8 g 1 Not Taking   Plecanatide (TRULANCE) 3 MG TABS Take 3 mg by mouth daily. (Patient not taking: Reported on 12/31/2020) 30 tablet 5 Not Taking   Scheduled:  amLODipine  5 mg Oral Daily   aspirin EC  81 mg Oral Daily   heparin  5,000 Units Subcutaneous Q8H   insulin aspart  0-5 Units Subcutaneous QHS   insulin aspart  0-9 Units Subcutaneous TID WC  isosorbide mononitrate  30 mg Oral Daily   lisinopril  20 mg Oral Daily   metoprolol tartrate  25 mg Oral BID   pantoprazole (PROTONIX) IV  40 mg Intravenous Q12H   potassium chloride  40 mEq Oral Q3H   ticagrelor  90 mg Oral BID   Continuous:  sodium chloride     sodium chloride     cefTRIAXone (ROCEPHIN)  IV 2 g (01/08/21 1714)   metronidazole 500 mg (01/09/21 0948)   OIZ:TIWPYK chloride, acetaminophen **OR** acetaminophen, HYDROmorphone (DILAUDID) injection, labetalol, ondansetron **OR** ondansetron (ZOFRAN) IV, polyethylene glycol  No Known Allergies   ROS:  A comprehensive review of systems was negative except for: Gastrointestinal: positive for abdominal pain  Blood pressure 130/81, pulse 94, temperature 98.2 F (36.8 C), temperature source Oral, resp. rate 20, height 6\' 1"  (1.854 m), weight 88.5 kg, SpO2 96 %. Physical Exam Vitals reviewed.  Constitutional:      Appearance: He is well-developed.  HENT:     Head: Normocephalic.  Eyes:     Pupils: Pupils are equal, round, and reactive to light.  Cardiovascular:     Rate and Rhythm: Normal rate.  Pulmonary:     Effort: Pulmonary effort is normal.  Abdominal:     Palpations: Abdomen is soft.     Tenderness: There is abdominal tenderness in the right upper quadrant and epigastric area.  Musculoskeletal:         General: Normal range of motion.  Skin:    General: Skin is warm.  Neurological:     General: No focal deficit present.     Mental Status: He is alert and oriented to person, place, and time.  Psychiatric:        Mood and Affect: Mood normal.        Behavior: Behavior normal.    Results: Results for orders placed or performed during the hospital encounter of 01/07/21 (from the past 48 hour(s))  Basic metabolic panel     Status: Abnormal   Collection Time: 01/07/21 11:42 AM  Result Value Ref Range   Sodium 137 135 - 145 mmol/L   Potassium 3.9 3.5 - 5.1 mmol/L   Chloride 102 98 - 111 mmol/L   CO2 23 22 - 32 mmol/L   Glucose, Bld 224 (H) 70 - 99 mg/dL    Comment: Glucose reference range applies only to samples taken after fasting for at least 8 hours.   BUN 11 6 - 20 mg/dL   Creatinine, Ser 0.88 0.61 - 1.24 mg/dL   Calcium 9.3 8.9 - 10.3 mg/dL   GFR, Estimated >60 >60 mL/min    Comment: (NOTE) Calculated using the CKD-EPI Creatinine Equation (2021)    Anion gap 12 5 - 15    Comment: Performed at [facility], [address], [place], [place] [address]  CBC     Status: Abnormal   Collection Time: 01/07/21 11:42 AM  Result Value Ref Range   WBC 19.0 (H) 4.0 - 10.5 K/uL   RBC 4.50 4.22 - 5.81 MIL/uL   Hemoglobin 13.1 13.0 - 17.0 g/dL   HCT 37.2 (L) 39.0 - 52.0 %   MCV 82.7 80.0 - 100.0 fL   MCH 29.1 26.0 - 34.0 pg   MCHC 35.2 30.0 - 36.0 g/dL   RDW 13.2 11.5 - 15.5 %   Platelets 265 150 - 400 K/uL   nRBC 0.0 0.0 - 0.2 %    Comment: Performed at [facility], [address] [place], [place]  [address]  Troponin I (High Sensitivity)     Status: Abnormal   Collection Time: 01/07/21 11:42 AM  Result Value Ref Range   Troponin I (High Sensitivity) 46 (H) <18 ng/L    Comment: DELTA CHECK NOTED RESULT CALLED TO, READ BACK BY AND VERIFIED WITH: KEMP,C AT 12:25PM ON 01/07/21 BY FESTERMAN,C (NOTE) Elevated high sensitivity troponin I (hsTnI) values and significant  changes  across serial measurements may suggest ACS but many other  chronic and acute conditions are known to elevate hsTnI results.  Refer to the Links section for chest pain algorithms and additional  guidance. Performed at [facility], [address] [place], [place] [address]   Brain natriuretic peptide     Status: None   Collection Time: 01/07/21 11:42 AM  Result Value Ref Range   B Natriuretic Peptide 66.0 0.0 - 100.0 pg/mL    Comment: Performed at [facility], [address] [place], [place] [address]  Troponin I (High Sensitivity)     Status: Abnormal   Collection Time: 01/07/21  1:16 PM  Result Value Ref Range   Troponin I (High Sensitivity) 37 (H) <18 ng/L    Comment: (NOTE) Elevated high sensitivity troponin I (hsTnI) values and significant  changes across serial measurements may suggest ACS but many other  chronic and acute conditions are known to elevate hsTnI results.  Refer to the "Links" section for chest pain algorithms and additional  guidance. Performed at [facility], [address], [place], [place] [address]   Hepatic function panel     Status: Abnormal   Collection Time: 01/07/21  1:16 PM  Result Value Ref Range   Total Protein 7.2 6.5 - 8.1 g/dL   Albumin 4.3 3.5 - 5.0 g/dL   AST 36 15 - 41 U/L   ALT 75 (H) 0 - 44 U/L   Alkaline Phosphatase 119 38 - 126 U/L   Total Bilirubin 1.6 (H) 0.3 - 1.2 mg/dL   Bilirubin, Direct 0.5 (H) 0.0 - 0.2 mg/dL   Indirect Bilirubin 1.1 (H) 0.3 - 0.9 mg/dL    Comment: Performed at [facility], [address] [place], [place] [address]  Lipase, blood     Status: None   Collection Time: 01/07/21  1:16 PM  Result Value Ref Range   Lipase 26 11 - 51 U/L    Comment: Performed at [facility], [address], [place], [place] [address]  Glucose, capillary     Status: Abnormal   Collection Time: 01/07/21  8:06 PM  Result Value Ref Range   Glucose-Capillary 230 (H) 70 - 99 mg/dL    Comment: Glucose reference range applies only to  samples taken after fasting for at least 8 hours.  Glucose, capillary     Status: Abnormal   Collection Time: 01/08/21 12:13 AM  Result Value Ref Range   Glucose-Capillary 227 (H) 70 - 99 mg/dL    Comment: Glucose reference range applies only to samples taken after fasting for at least 8 hours.  Urine rapid drug screen (hosp performed)     Status: Abnormal   Collection Time: 01/08/21 12:41 AM  Result Value Ref Range   Opiates POSITIVE (A) NONE DETECTED   Cocaine NONE DETECTED NONE DETECTED   Benzodiazepines NONE DETECTED NONE DETECTED   Amphetamines NONE DETECTED NONE DETECTED   Tetrahydrocannabinol POSITIVE (A) NONE DETECTED   Barbiturates NONE DETECTED NONE DETECTED    Comment: (NOTE) DRUG SCREEN FOR MEDICAL PURPOSES ONLY.  IF CONFIRMATION IS NEEDED FOR ANY  PURPOSE, NOTIFY LAB WITHIN 5 DAYS.  LOWEST DETECTABLE LIMITS FOR URINE DRUG SCREEN Drug Class                     Cutoff (ng/mL) Amphetamine and metabolites    1000 Barbiturate and metabolites    200 Benzodiazepine                 323 Tricyclics and metabolites     300 Opiates and metabolites        300 Cocaine and metabolites        300 THC                            50 Performed at [facility], [address] [place], [place] [address]   Glucose, capillary     Status: Abnormal   Collection Time: 01/08/21  4:28 AM  Result Value Ref Range   Glucose-Capillary 185 (H) 70 - 99 mg/dL    Comment: Glucose reference range applies only to samples taken after fasting for at least 8 hours.  Basic metabolic panel     Status: Abnormal   Collection Time: 01/08/21  4:57 AM  Result Value Ref Range   Sodium 137 135 - 145 mmol/L   Potassium 3.7 3.5 - 5.1 mmol/L   Chloride 103 98 - 111 mmol/L   CO2 22 22 - 32 mmol/L   Glucose, Bld 197 (H) 70 - 99 mg/dL    Comment: Glucose reference range applies only to samples taken after fasting for at least 8 hours.   BUN 10 6 - 20 mg/dL   Creatinine, Ser 0.83 0.61 - 1.24 mg/dL   Calcium 8.7  (L) 8.9 - 10.3 mg/dL   GFR, Estimated >60 >60 mL/min    Comment: (NOTE) Calculated using the CKD-EPI Creatinine Equation (2021)    Anion gap 12 5 - 15    Comment: Performed at [facility], [address] [place], [place] [address]  CBC     Status: Abnormal   Collection Time: 01/08/21  4:57 AM  Result Value Ref Range   WBC 23.8 (H) 4.0 - 10.5 K/uL   RBC 4.36 4.22 - 5.81 MIL/uL   Hemoglobin 12.4 (L) 13.0 - 17.0 g/dL   HCT 35.9 (L) 39.0 - 52.0 %   MCV 82.3 80.0 - 100.0 fL   MCH 28.4 26.0 - 34.0 pg   MCHC 34.5 30.0 - 36.0 g/dL   RDW 13.2 11.5 - 15.5 %   Platelets 248 150 - 400 K/uL   nRBC 0.0 0.0 - 0.2 %    Comment: Performed at [facility], [address], [place], [place] [address]  Glucose, capillary     Status: Abnormal   Collection Time: 01/08/21  7:40 AM  Result Value Ref Range   Glucose-Capillary 214 (H) 70 - 99 mg/dL    Comment: Glucose reference range applies only to samples taken after fasting for at least 8 hours.  Culture, blood (Routine X 2) w Reflex to ID Panel     Status: None (Preliminary result)   Collection Time: 01/08/21 10:12 AM   Specimen: BLOOD RIGHT HAND  Result Value Ref Range   Specimen Description BLOOD RIGHT HAND    Special Requests      BOTTLES DRAWN AEROBIC AND ANAEROBIC Blood Culture adequate volume Performed at [facility], [address], [place],  [address]    Culture PENDING    Report Status PENDING  Culture, blood (Routine X 2) w Reflex to ID Panel     Status: None (Preliminary result)   Collection Time: 01/08/21 10:13 AM   Specimen: BLOOD LEFT HAND  Result Value Ref Range   Specimen Description BLOOD LEFT HAND    Special Requests      BOTTLES DRAWN AEROBIC ONLY Blood Culture results may not be optimal due to an inadequate volume of blood received in culture bottles Performed at [facility], [address], [place], [place] [address]    Culture PENDING    Report Status PENDING   Glucose, capillary     Status: Abnormal    Collection Time: 01/08/21 11:04 AM  Result Value Ref Range   Glucose-Capillary 165 (H) 70 - 99 mg/dL    Comment: Glucose reference range applies only to samples taken after fasting for at least 8 hours.  Glucose, capillary     Status: Abnormal   Collection Time: 01/08/21  4:19 PM  Result Value Ref Range   Glucose-Capillary 167 (H) 70 - 99 mg/dL    Comment: Glucose reference range applies only to samples taken after fasting for at least 8 hours.  Glucose, capillary     Status: Abnormal   Collection Time: 01/08/21  8:40 PM  Result Value Ref Range   Glucose-Capillary 210 (H) 70 - 99 mg/dL    Comment: Glucose reference range applies only to samples taken after fasting for at least 8 hours.  Glucose, capillary     Status: Abnormal   Collection Time: 01/09/21  7:07 AM  Result Value Ref Range   Glucose-Capillary 210 (H) 70 - 99 mg/dL    Comment: Glucose reference range applies only to samples taken after fasting for at least 8 hours.  CBC     Status: Abnormal   Collection Time: 01/09/21  7:25 AM  Result Value Ref Range   WBC 19.1 (H) 4.0 - 10.5 K/uL   RBC 3.74 (L) 4.22 - 5.81 MIL/uL   Hemoglobin 10.5 (L) 13.0 - 17.0 g/dL   HCT 30.9 (L) 39.0 - 52.0 %   MCV 82.6 80.0 - 100.0 fL   MCH 28.1 26.0 - 34.0 pg   MCHC 34.0 30.0 - 36.0 g/dL   RDW 13.5 11.5 - 15.5 %   Platelets 227 150 - 400 K/uL   nRBC 0.0 0.0 - 0.2 %    Comment: Performed at [facility], [address], [place], [place] [address]  Comprehensive metabolic panel     Status: Abnormal   Collection Time: 01/09/21  7:25 AM  Result Value Ref Range   Sodium 137 135 - 145 mmol/L   Potassium 3.2 (L) 3.5 - 5.1 mmol/L   Chloride 102 98 - 111 mmol/L   CO2 23 22 - 32 mmol/L   Glucose, Bld 218 (H) 70 - 99 mg/dL    Comment: Glucose reference range applies only to samples taken after fasting for at least 8 hours.   BUN 14 6 - 20 mg/dL   Creatinine, Ser 0.95 0.61 - 1.24 mg/dL   Calcium 8.6 (L) 8.9 - 10.3 mg/dL   Total Protein 6.6 6.5 - 8.1  g/dL   Albumin 3.1 (L) 3.5 - 5.0 g/dL   AST 15 15 - 41 U/L   ALT 54 (H) 0 - 44 U/L   Alkaline Phosphatase 108 38 - 126 U/L   Total Bilirubin 1.3 (H) 0.3 - 1.2 mg/dL   GFR, Estimated >60 >60 mL/min    Comment: (NOTE) Calculated using the CKD-EPI  Creatinine Equation (2021)    Anion gap 12 5 - 15    Comment: Performed at [facility], [address], [place], [place] [address]   Personally reviewed [place], CT and HIDA- large distended gallbladder with layering sludge, hida positive for cholecystitis  DG Chest 2 View  Result Date: 01/07/2021 CLINICAL DATA:  Chest pain and shortness of breath. EXAM: CHEST - 2 VIEW COMPARISON:  CT chest from same day.  Chest x-ray from yesterday. FINDINGS: The heart size and mediastinal contours are within normal limits. Both lungs are clear. The visualized skeletal structures are unremarkable. IMPRESSION: No active cardiopulmonary disease. Electronically Signed   By: Titus Dubin M.D.   On: 01/07/2021 12:18   NM Hepatobiliary Liver Func  Result Date: 01/08/2021 CLINICAL DATA:  Upper abdominal pain, RIGHT upper quadrant pain, nausea, vomiting EXAM: NUCLEAR MEDICINE HEPATOBILIARY IMAGING TECHNIQUE: Sequential images of the abdomen were obtained out to 60 minutes following intravenous administration of radiopharmaceutical. RADIOPHARMACEUTICALS:  5.3 mCi Tc-76m  Choletec IV COMPARISON:  CT abdomen and pelvis 01/07/2021, ultrasound RIGHT upper quadrant 01/07/2021 FINDINGS: Prompt tracer extraction from bloodstream indicating normal hepatocellular function. Prompt excretion of tracer into biliary tree. Small bowel visualized at 16 minutes. At 1 hour gallbladder had not visualized. Patient then received 3 mg of morphine IV and imaging was continued for 30 minutes. Gallbladder failed to visualize following morphine administration. Findings are consistent with cystic duct obstruction and acute cholecystitis. IMPRESSION: Patent CBD. Nonvisualization of gallbladder despite  morphine administration consistent with acute cholecystitis. Electronically Signed   By: Lavonia Dana M.D.   On: 01/08/2021 15:03   US Abdomen Limited RUQ (LIVER/GB)  Result Date: 01/07/2021 CLINICAL DATA:  5 day history of epigastric pain. EXAM: ULTRASOUND ABDOMEN LIMITED RIGHT UPPER QUADRANT COMPARISON:  None. FINDINGS: Gallbladder: Gallbladder wall is thickened up to 5 mm. There is dependent nonshadowing sludge and probable tiny stones. No pericholecystic fluid. Sonographer reports no sonographic Murphy sign. Common bile duct: Diameter: 4-5 mm Liver: No focal lesion identified. Within normal limits in parenchymal echogenicity. Portal vein is patent on color Doppler imaging with normal direction of blood flow towards the liver. Other: None. IMPRESSION: Gallbladder wall thickening with dependent sludge and probable tiny stones. No pericholecystic fluid or sonographic Murphy sign. No biliary dilatation. Nuclear scintigraphy may prove helpful to further evaluate if cystic duct obstruction is a clinical concern. Electronically Signed   By: Misty Stanley M.D.   On: 01/07/2021 16:01     Assessment & Plan:  Jacob Baker is a 50 y.o. male with acute cholecystitis. Discussed the normal management with cholecystectomy but fact of his recent NSTEMI and Brilinta. Discussed that risk of bleeding is higher with Brilinta but coming off is high risk for stent occlusion and further cardiac issues. Discussed that antibiotics are the best option right now and hopefully he can avoid further procedures until he is 6 months out from his stent 06/2020. Even if Brilinta could be held issue of stress of anesthesia is concerning in recent MI, so would try to avoid if possible.   Diet as tolerated  Would do 14 day  total course, on flagyl/ ceftriaxone right now, which is fine, can d/c home with Augmentin    Discussed with team and patient.   Virl Cagey 01/09/2021, 10:14 AM

## 2021-01-10 LAB — CBC
HCT: 29.6 % — ABNORMAL LOW (ref 39.0–52.0)
Hemoglobin: 10.1 g/dL — ABNORMAL LOW (ref 13.0–17.0)
MCH: 28.5 pg (ref 26.0–34.0)
MCHC: 34.1 g/dL (ref 30.0–36.0)
MCV: 83.4 fL (ref 80.0–100.0)
Platelets: 233 10*3/uL (ref 150–400)
RBC: 3.55 MIL/uL — ABNORMAL LOW (ref 4.22–5.81)
RDW: 13.4 % (ref 11.5–15.5)
WBC: 16.5 10*3/uL — ABNORMAL HIGH (ref 4.0–10.5)
nRBC: 0 % (ref 0.0–0.2)

## 2021-01-10 LAB — COMPREHENSIVE METABOLIC PANEL
ALT: 46 U/L — ABNORMAL HIGH (ref 0–44)
AST: 15 U/L (ref 15–41)
Albumin: 3 g/dL — ABNORMAL LOW (ref 3.5–5.0)
Alkaline Phosphatase: 138 U/L — ABNORMAL HIGH (ref 38–126)
Anion gap: 11 (ref 5–15)
BUN: 14 mg/dL (ref 6–20)
CO2: 19 mmol/L — ABNORMAL LOW (ref 22–32)
Calcium: 8.3 mg/dL — ABNORMAL LOW (ref 8.9–10.3)
Chloride: 108 mmol/L (ref 98–111)
Creatinine, Ser: 0.91 mg/dL (ref 0.61–1.24)
GFR, Estimated: >60 mL/min (ref >60)
Glucose, Bld: 236 mg/dL — ABNORMAL HIGH (ref 70–99)
Potassium: 3.6 mmol/L (ref 3.5–5.1)
Sodium: 138 mmol/L (ref 135–145)
Total Bilirubin: 0.9 mg/dL (ref 0.3–1.2)
Total Protein: 6.5 g/dL (ref 6.5–8.1)

## 2021-01-10 LAB — GLUCOSE, CAPILLARY
Glucose-Capillary: 157 mg/dL — ABNORMAL HIGH (ref 70–99)
Glucose-Capillary: 173 mg/dL — ABNORMAL HIGH (ref 70–99)
Glucose-Capillary: 198 mg/dL — ABNORMAL HIGH (ref 70–99)
Glucose-Capillary: 204 mg/dL — ABNORMAL HIGH (ref 70–99)
Glucose-Capillary: 208 mg/dL — ABNORMAL HIGH (ref 70–99)
Glucose-Capillary: 225 mg/dL — ABNORMAL HIGH (ref 70–99)

## 2021-01-10 MED ORDER — INSULIN ASPART 100 UNIT/ML IJ SOLN
0.0000 [IU] | Freq: Three times a day (TID) | INTRAMUSCULAR | Status: DC
  Administered 2021-01-10 – 2021-01-11 (×3): 4 [IU] via SUBCUTANEOUS
  Administered 2021-01-11: 7 [IU] via SUBCUTANEOUS
  Administered 2021-01-11: 4 [IU] via SUBCUTANEOUS
  Administered 2021-01-12: 08:00:00 7 [IU] via SUBCUTANEOUS
  Administered 2021-01-12 – 2021-01-13 (×3): 4 [IU] via SUBCUTANEOUS
  Administered 2021-01-13: 09:00:00 7 [IU] via SUBCUTANEOUS

## 2021-01-10 MED ORDER — ENSURE MAX PROTEIN PO LIQD
11.0000 [oz_av] | Freq: Two times a day (BID) | ORAL | Status: DC
  Administered 2021-01-12: 21:00:00 11 [oz_av] via ORAL

## 2021-01-10 MED ORDER — ONDANSETRON HCL 4 MG PO TABS
8.0000 mg | ORAL_TABLET | Freq: Three times a day (TID) | ORAL | Status: DC
  Administered 2021-01-10 – 2021-01-13 (×9): 8 mg via ORAL
  Filled 2021-01-10 (×8): qty 2

## 2021-01-10 MED ORDER — OXYCODONE-ACETAMINOPHEN 7.5-325 MG PO TABS
1.0000 | ORAL_TABLET | ORAL | Status: DC | PRN
  Administered 2021-01-10 (×2): 1 via ORAL
  Filled 2021-01-10 (×2): qty 1

## 2021-01-10 MED ORDER — INSULIN ASPART 100 UNIT/ML IJ SOLN
0.0000 [IU] | Freq: Every day | INTRAMUSCULAR | Status: DC
  Administered 2021-01-10 – 2021-01-13 (×2): 2 [IU] via SUBCUTANEOUS

## 2021-01-10 MED ORDER — ACETAMINOPHEN 325 MG PO TABS
650.0000 mg | ORAL_TABLET | Freq: Four times a day (QID) | ORAL | Status: DC | PRN
  Administered 2021-01-12: 10:00:00 650 mg via ORAL
  Filled 2021-01-10: qty 2

## 2021-01-10 MED ORDER — SENNOSIDES-DOCUSATE SODIUM 8.6-50 MG PO TABS
2.0000 | ORAL_TABLET | Freq: Every day | ORAL | Status: DC
  Administered 2021-01-10 – 2021-01-12 (×3): 2 via ORAL
  Filled 2021-01-10 (×3): qty 2

## 2021-01-10 MED ORDER — ACETAMINOPHEN 500 MG PO TABS
1000.0000 mg | ORAL_TABLET | Freq: Four times a day (QID) | ORAL | Status: DC
  Administered 2021-01-10: 1000 mg via ORAL
  Filled 2021-01-10: qty 2

## 2021-01-10 MED ORDER — SODIUM CHLORIDE 0.9 % IV SOLN
12.5000 mg | Freq: Four times a day (QID) | INTRAVENOUS | Status: DC | PRN
  Filled 2021-01-10: qty 0.5

## 2021-01-10 MED ORDER — POLYETHYLENE GLYCOL 3350 17 G PO PACK
17.0000 g | PACK | Freq: Two times a day (BID) | ORAL | Status: DC
  Administered 2021-01-10 – 2021-01-13 (×6): 17 g via ORAL
  Filled 2021-01-10 (×7): qty 1

## 2021-01-10 NOTE — Progress Notes (Signed)
Inpatient Diabetes Program Recommendations  AACE/ADA: New Consensus Statement on Inpatient Glycemic Control (2015)  Target Ranges:  Prepandial:   less than 140 mg/dL      Peak postprandial:   less than 180 mg/dL (1-2 hours)      Critically ill patients:  140 - 180 mg/dL    Latest Reference Range & Units 01/09/21 07:07 01/09/21 11:04 01/09/21 16:04 01/09/21 20:22  Glucose-Capillary 70 - 99 mg/dL 210 (H) 227 (H) 189 (H) 198 (H)    Latest Reference Range & Units 01/10/21 07:15  Glucose-Capillary 70 - 99 mg/dL 225 (H)      Home DM Meds: Glipizide XL 10 mg QAM      Farxiga 10 mg daily      Metformin 850 mg BID  Current Orders: Novolog 0-9 units TID AC + HS    MD- Note AM CBGs >200 yesterday and again this AM  Please consider ordering Semglee 5 units QHS (0.05 units/kg)     --Will follow patient during hospitalization--  Wyn Quaker RN, MSN, CDE Diabetes Coordinator Inpatient Glycemic Control Team Team Pager: [phone] (8a-5p)

## 2021-01-10 NOTE — Progress Notes (Signed)
[facility] Progress Note     Subjective: Having pain still, WBC down, still with nausea.   Objective: Vital signs in last 24 hours: Temp:  [98.4 F (36.9 C)-100 F (37.8 C)] 98.4 F (36.9 C) (12/16 1244) Pulse Rate:  [81-111] 81 (12/16 1244) Resp:  [18-20] 18 (12/16 1244) BP: (116-143)/(77-86) 125/83 (12/16 1244) SpO2:  [94 %-98 %] 95 % (12/16 1244) Last BM Date: 01/05/21  Intake/Output from previous day: 12/15 0701 - 12/16 0700 In: 2265.9 [P.O.:720; I.V.:1245.9; IV Piggyback:300] Out: 750 [Urine:750] Intake/Output this shift: Total I/O In: 240 [P.O.:240] Out: 1250 [Urine:1250]  General appearance: alert and no distress GI: soft, tender RUQ and some guarding   Lab Results:  Recent Labs    01/09/21 0725 01/10/21 0609  WBC 19.1* 16.5*  HGB 10.5* 10.1*  HCT 30.9* 29.6*  PLT 227 233   BMET Recent Labs    01/09/21 0725 01/10/21 0609  NA 137 138  K 3.2* 3.6  CL 102 108  CO2 23 19*  GLUCOSE 218* 236*  BUN 14 14  CREATININE 0.95 0.91  CALCIUM 8.6* 8.3*   PT/INR No results for input(s): LABPROT, INR in the last 72 hours.  Studies/Results: NM Hepatobiliary Liver Func  Result Date: 01/08/2021 CLINICAL DATA:  Upper abdominal pain, RIGHT upper quadrant pain, nausea, vomiting EXAM: NUCLEAR MEDICINE HEPATOBILIARY IMAGING TECHNIQUE: Sequential images of the abdomen were obtained out to 60 minutes following intravenous administration of radiopharmaceutical. RADIOPHARMACEUTICALS:  5.3 mCi Tc-40m  Choletec IV COMPARISON:  CT abdomen and pelvis 01/07/2021, ultrasound RIGHT upper quadrant 01/07/2021 FINDINGS: Prompt tracer extraction from bloodstream indicating normal hepatocellular function. Prompt excretion of tracer into biliary tree. Small bowel visualized at 16 minutes. At 1 hour gallbladder had not visualized. Patient then received 3 mg of morphine IV and imaging was continued for 30 minutes. Gallbladder failed to visualize following morphine  administration. Findings are consistent with cystic duct obstruction and acute cholecystitis. IMPRESSION: Patent CBD. Nonvisualization of gallbladder despite morphine administration consistent with acute cholecystitis. Electronically Signed   By: Lavonia Dana M.D.   On: 01/08/2021 15:03    Anti-infectives: Anti-infectives (From admission, onward)    Start     Dose/Rate Route Frequency Ordered Stop   01/08/21 2000  metroNIDAZOLE (FLAGYL) IVPB 500 mg        500 mg 100 mL/hr over 60 Minutes Intravenous Every 12 hours 01/08/21 1721     01/08/21 1800  cefTRIAXone (ROCEPHIN) 2 g in sodium chloride 0.9 % 100 mL IVPB        2 g 200 mL/hr over 30 Minutes Intravenous Every 24 hours 01/08/21 1652         Assessment/Plan: Patient with acute cholecystitis in the setting of recent NSTEMI 12/5 and stent placement. Want to avoid procedure if at all possible. Diet as tolerated Zofran 8mg  with meals scheduled now and PRN phenergan Still with pain, PRN For pain control, scheduled 1000 mg acetaminophen q 6 for now  Ceftriaxone and Flagyl for antibiotics, leukocytosis down, but if pain still issue could always see if zosyn treats it more thoroughly/ better pseudomonas coverage  and improves pain?  Dc home if tolerating diet and pain controlled over weekend Can see in clinic in 1-2 weeks after dc  Call the office to let them know Dr. Lysle Pearl covering this weekend      LOS: 2 days    Virl Cagey 01/10/2021

## 2021-01-10 NOTE — Progress Notes (Signed)
PROGRESS NOTE     Jacob Baker, is a 50 y.o. male, DOB - April 17, 1970, TGG:[id number]  Admit date - 01/07/2021   Admitting Physician Tiana Sivertson Denton Brick, MD  Outpatient Primary MD for the patient is [name], MD  LOS - 2  Chief Complaint  Patient presents with   Chest Pain       Brief Narrative:  50 y.o. male with medical history significant for  CAD, HTN, DM. Presented to the ED with persistent abdominal pain that started after he was hospitalized for NSTEMI and underwent cath 2ce- 12/6 and 01/01/21 admitted on 01/07/2021 with right upper quadrant pain and found to have acute cholecystitis  Assessment & Plan:   Principal Problem:   Acute cholecystitis Active Problems:   HTN (hypertension)   Essential hypertension   Hyperglycemia due to diabetes mellitus ([place])   Status post coronary artery stent placement   Intractable abdominal pain   Colicky RUQ abdominal pain   Nausea without vomiting   Elevated LFTs   1) acute cholecystitis--patient presented with right upper quadrant pain as well as leukocytosis and nausea RUQ ultrasound consistent with gallbladder wall thickening, HIDA scan consistent with acute cholecystitis -GI and general surgery input appreciated -not a great candidate for surgery given recent LHC with DAPT -IV Rocephin and Flagyl for now -WBC  23.8>> 19.1 >>16.5 Nausea and abdominal pain persist--resulting in very poor oral intake at this time--continue IV fluids until oral intake improved -Requiring IV Dilaudid and IV Zofran  2)CAD--- recent NSTEMI with angioplasty and stents x 2 placement,s/p LHC 12/31/20 and 01/01/21 -Continue aspirin and Brilinta  3)DM2-A1c 7.4 reflecting uncontrolled DM with hyperglycemia PTA -Continue to hold metformin, Farxiga and glipizide Use Novolog/Humalog Sliding scale insulin with Accu-Cheks/Fingersticks as ordered   Disposition/Need for in-Hospital Stay- patient unable to be discharged at this time due to --acute  cholecystitis requiring IV antibiotics, not a great candidate for surgery given recent LHC with DAPT -Nausea and abdominal pain persist--resulting in very poor oral intake at this time--continue IV fluids until oral intake improved -Requiring IV Dilaudid and IV Zofran -Discharge home when pain control improves and tolerating oral intake  Status is: Inpatient  Remains inpatient appropriate because: See disposition above  Disposition: The patient is from: Home              Anticipated d/c is to: Home              Anticipated d/c date is: 2 days              Patient currently is not medically stable to d/c. Barriers: Not Clinically Stable-   Code Status :  -  Code Status: Full Code   Family Communication:    (patient is alert, awake and coherent)  -Called and updated patient's wife by phone Consults  :  Gi/Gen surgery  DVT Prophylaxis  :   - SCDs   heparin injection 5,000 Units Start: 01/07/21 2200  Lab Results  Component Value Date   PLT 233 01/10/2021    Inpatient Medications  Scheduled Meds:  acetaminophen  1,000 mg Oral Q6H   amLODipine  5 mg Oral Daily   aspirin EC  81 mg Oral Daily   heparin  5,000 Units Subcutaneous Q8H   insulin aspart  0-20 Units Subcutaneous TID WC   insulin aspart  0-5 Units Subcutaneous QHS   isosorbide mononitrate  30 mg Oral Daily   lisinopril  20 mg Oral Daily   metoprolol tartrate  25 mg  Oral BID   ondansetron  8 mg Oral TID AC   pantoprazole (PROTONIX) IV  40 mg Intravenous Q12H   polyethylene glycol  17 g Oral BID   Ensure Max Protein  11 oz Oral BID   senna-docusate  2 tablet Oral QHS   ticagrelor  90 mg Oral BID   Continuous Infusions:  sodium chloride     cefTRIAXone (ROCEPHIN)  IV 2 g (01/10/21 1723)   metronidazole 500 mg (01/10/21 0921)   promethazine (PHENERGAN) injection (IM or IVPB)     PRN Meds:.sodium chloride, HYDROmorphone (DILAUDID) injection, labetalol, oxyCODONE-acetaminophen, polyethylene glycol, promethazine  (PHENERGAN) injection (IM or IVPB)   Anti-infectives (From admission, onward)    Start     Dose/Rate Route Frequency Ordered Stop   01/08/21 2000  metroNIDAZOLE (FLAGYL) IVPB 500 mg        500 mg 100 mL/hr over 60 Minutes Intravenous Every 12 hours 01/08/21 1721     01/08/21 1800  cefTRIAXone (ROCEPHIN) 2 g in sodium chloride 0.9 % 100 mL IVPB        2 g 200 mL/hr over 30 Minutes Intravenous Every 24 hours 01/08/21 1652           Subjective: [name] today has no fevers, no emesis,  No chest pain,   -Wife at bedside -Oral intake is poor due to persistent nausea and abdominal pain   Objective: Vitals:   01/10/21 0556 01/10/21 0900 01/10/21 1058 01/10/21 1244  BP: (!) 143/86 (!) 142/86 (!) 142/86 125/83  Pulse: 100 (!) 101 (!) 103 81  Resp: 20 20  18   Temp: 98.7 F (37.1 C) 100 F (37.8 C) 100 F (37.8 C) 98.4 F (36.9 C)  TempSrc: Oral Oral Oral Oral  SpO2: 95%  95% 95%  Weight:      Height:        Intake/Output Summary (Last 24 hours) at 01/10/2021 1850 Last data filed at 01/10/2021 1700 Gross per 24 hour  Intake 1488.9 ml  Output 1700 ml  Net -211.1 ml   Filed Weights   01/07/21 1123  Weight: 88.5 kg   Physical Exam  Gen:- Awake Alert,  in no apparent distress  HEENT:- [place].AT, No sclera icterus Neck-Supple Neck,No JVD,.  Lungs-  CTAB , fair symmetrical air movement CV- S1, S2 normal, regular  Abd-  +ve B.Sounds, Abd Soft, right upper quadrant tenderness Extremity/Skin:- No  edema, pedal pulses present  Psych-affect is appropriate, oriented x3 Neuro-no new focal deficits, no tremors  Data Reviewed: I have personally reviewed following labs and imaging studies  CBC: Recent Labs  Lab 01/06/21 2331 01/07/21 1142 01/08/21 0457 01/09/21 0725 01/10/21 0609  WBC 13.0* 19.0* 23.8* 19.1* 16.5*  HGB 12.8* 13.1 12.4* 10.5* 10.1*  HCT 36.5* 37.2* 35.9* 30.9* 29.6*  MCV 82.8 82.7 82.3 82.6 83.4  PLT 253 265 248 227 119   Basic Metabolic  Panel: Recent Labs  Lab 01/06/21 2331 01/07/21 1142 01/08/21 0457 01/09/21 0725 01/10/21 0609  NA 137 137 137 137 138  K 3.6 3.9 3.7 3.2* 3.6  CL 106 102 103 102 108  CO2 22 23 22 23  19*  GLUCOSE 165* 224* 197* 218* 236*  BUN 12 11 10 14 14   CREATININE 0.95 0.88 0.83 0.95 0.91  CALCIUM 8.9 9.3 8.7* 8.6* 8.3*   GFR: Estimated Creatinine Clearance: 107.3 mL/min (by C-G formula based on SCr of 0.91 mg/dL). Liver Function Tests: Recent Labs  Lab 01/06/21 2331 01/07/21 1316 01/09/21 0725 01/10/21 1478  AST 15 36 15 15  ALT 30 75* 54* 46*  ALKPHOS 91 119 108 138*  BILITOT 0.8 1.6* 1.3* 0.9  PROT 7.1 7.2 6.6 6.5  ALBUMIN 4.1 4.3 3.1* 3.0*   Recent Labs  Lab 01/06/21 2331 01/07/21 1316  LIPASE 50 26   No results for input(s): AMMONIA in the last 168 hours. Coagulation Profile: No results for input(s): INR, PROTIME in the last 168 hours. Cardiac Enzymes: No results for input(s): CKTOTAL, CKMB, CKMBINDEX, TROPONINI in the last 168 hours. BNP (last 3 results) No results for input(s): PROBNP in the last 8760 hours. HbA1C: No results for input(s): HGBA1C in the last 72 hours. CBG: Recent Labs  Lab 01/09/21 2022 01/10/21 0009 01/10/21 0715 01/10/21 1109 01/10/21 1617  GLUCAP 198* 204* 225* 157* 198*   Lipid Profile: No results for input(s): CHOL, HDL, LDLCALC, TRIG, CHOLHDL, LDLDIRECT in the last 72 hours. Thyroid Function Tests: No results for input(s): TSH, T4TOTAL, FREET4, T3FREE, THYROIDAB in the last 72 hours. Anemia Panel: No results for input(s): VITAMINB12, FOLATE, FERRITIN, TIBC, IRON, RETICCTPCT in the last 72 hours. Urine analysis:    Component Value Date/Time   COLORURINE Yellow 10/04/2013 2025   APPEARANCEUR Clear 10/04/2013 2025   LABSPEC 1.021 10/04/2013 2025   PHURINE 5.0 10/04/2013 2025   GLUCOSEU Negative 10/04/2013 2025   HGBUR Negative 10/04/2013 2025   BILIRUBINUR Negative 10/04/2013 2025   KETONESUR Negative 10/04/2013 2025    PROTEINUR 30 mg/dL 10/04/2013 2025   NITRITE Negative 10/04/2013 2025   LEUKOCYTESUR Negative 10/04/2013 2025   Sepsis Labs: @LABRCNTIP (procalcitonin:4,lacticidven:4)  ) Recent Results (from the past 240 hour(s))  Culture, blood (Routine X 2) w Reflex to ID Panel     Status: None (Preliminary result)   Collection Time: 01/08/21 10:12 AM   Specimen: BLOOD RIGHT HAND  Result Value Ref Range Status   Specimen Description BLOOD RIGHT HAND  Final   Special Requests   Final    BOTTLES DRAWN AEROBIC AND ANAEROBIC Blood Culture adequate volume   Culture   Final    NO GROWTH 2 DAYS Performed at [facility], [address], [place], [place] [address]    Report Status PENDING  Incomplete  Culture, blood (Routine X 2) w Reflex to ID Panel     Status: None (Preliminary result)   Collection Time: 01/08/21 10:13 AM   Specimen: BLOOD LEFT HAND  Result Value Ref Range Status   Specimen Description BLOOD LEFT HAND  Final   Special Requests   Final    BOTTLES DRAWN AEROBIC ONLY Blood Culture results may not be optimal due to an inadequate volume of blood received in culture bottles   Culture   Final    NO GROWTH 2 DAYS Performed at [facility], [address] [place], [place] [address]    Report Status PENDING  Incomplete      Radiology Studies: No results found.   Scheduled Meds:  acetaminophen  1,000 mg Oral Q6H   amLODipine  5 mg Oral Daily   aspirin EC  81 mg Oral Daily   heparin  5,000 Units Subcutaneous Q8H   insulin aspart  0-20 Units Subcutaneous TID WC   insulin aspart  0-5 Units Subcutaneous QHS   isosorbide mononitrate  30 mg Oral Daily   lisinopril  20 mg Oral Daily   metoprolol tartrate  25 mg Oral BID   ondansetron  8 mg Oral TID AC   pantoprazole (PROTONIX) IV  40 mg Intravenous Q12H  polyethylene glycol  17 g Oral BID   Ensure Max Protein  11 oz Oral BID   senna-docusate  2 tablet Oral QHS   ticagrelor  90 mg Oral BID   Continuous Infusions:  sodium chloride      cefTRIAXone (ROCEPHIN)  IV 2 g (01/10/21 1723)   metronidazole 500 mg (01/10/21 0921)   promethazine (PHENERGAN) injection (IM or IVPB)       LOS: 2 days    Roxan Hockey M.D on 01/10/2021 at 6:50 PM  Go to [email or web address] - for contact info  [facility] - Office  [phone]  If 7PM-7AM, please contact night-coverage [email or web address] Password [email or web address] 01/10/2021, 6:50 PM

## 2021-01-11 LAB — CBC
HCT: 27.9 % — ABNORMAL LOW (ref 39.0–52.0)
Hemoglobin: 9.8 g/dL — ABNORMAL LOW (ref 13.0–17.0)
MCH: 29.4 pg (ref 26.0–34.0)
MCHC: 35.1 g/dL (ref 30.0–36.0)
MCV: 83.8 fL (ref 80.0–100.0)
Platelets: 266 10*3/uL (ref 150–400)
RBC: 3.33 MIL/uL — ABNORMAL LOW (ref 4.22–5.81)
RDW: 14 % (ref 11.5–15.5)
WBC: 15.8 10*3/uL — ABNORMAL HIGH (ref 4.0–10.5)
nRBC: 0 % (ref 0.0–0.2)

## 2021-01-11 LAB — GLUCOSE, CAPILLARY
Glucose-Capillary: 185 mg/dL — ABNORMAL HIGH (ref 70–99)
Glucose-Capillary: 195 mg/dL — ABNORMAL HIGH (ref 70–99)
Glucose-Capillary: 215 mg/dL — ABNORMAL HIGH (ref 70–99)
Glucose-Capillary: 171 mg/dL — ABNORMAL HIGH (ref 70–99)
Glucose-Capillary: 162 mg/dL — ABNORMAL HIGH (ref 70–99)

## 2021-01-11 MED ORDER — ROSUVASTATIN CALCIUM 20 MG PO TABS
40.0000 mg | ORAL_TABLET | Freq: Every day | ORAL | Status: DC
  Administered 2021-01-11 – 2021-01-13 (×3): 40 mg via ORAL
  Filled 2021-01-11 (×3): qty 2

## 2021-01-11 MED ORDER — PIPERACILLIN-TAZOBACTAM 3.375 G IVPB 30 MIN: 3.3750 g | Freq: Four times a day (QID) | INTRAVENOUS | Status: DC

## 2021-01-11 MED ORDER — METRONIDAZOLE 500 MG PO TABS
500.0000 mg | ORAL_TABLET | Freq: Once | ORAL | Status: AC
  Administered 2021-01-11: 500 mg via ORAL
  Filled 2021-01-11: qty 1

## 2021-01-11 MED ORDER — PIPERACILLIN-TAZOBACTAM 3.375 G IVPB
3.3750 g | Freq: Three times a day (TID) | INTRAVENOUS | Status: DC
Start: 2021-01-11 — End: 2021-01-13
  Administered 2021-01-11 – 2021-01-13 (×7): 3.375 g via INTRAVENOUS
  Filled 2021-01-11 (×7): qty 50

## 2021-01-11 MED ORDER — PIPERACILLIN-TAZOBACTAM 3.375 G IVPB 30 MIN: 3.3750 g | Freq: Once | INTRAVENOUS | Status: DC

## 2021-01-11 NOTE — Progress Notes (Signed)
Subjective:  CC: Jacob Baker is a 50 y.o. male  Hospital stay day 3,   acute cholecystitis  HPI: No acute issues overnight.  Still having pain, tolerating diet but decreased appetite.  Still having BMs  ROS:  General: Denies weight loss, weight gain, fatigue, fevers, chills, and night sweats. Heart: Denies chest pain, palpitations, racing heart, irregular heartbeat, leg pain or swelling, and decreased activity tolerance. Respiratory: Denies breathing difficulty, shortness of breath, wheezing, cough, and sputum. GI: Denies change in appetite, heartburn, nausea, vomiting, constipation, diarrhea, and blood in stool. GU: Denies difficulty urinating, pain with urinating, urgency, frequency, blood in urine.   Objective:   Temp:  [98.3 F (36.8 C)-98.7 F (37.1 C)] 98.7 F (37.1 C) (12/17 0420) Pulse Rate:  [81-96] 96 (12/17 1037) Resp:  [17-20] 20 (12/17 0420) BP: (123-154)/(79-90) 143/90 (12/17 1037) SpO2:  [95 %-99 %] 98 % (12/17 0420)     Height: 6\' 1"  (185.4 cm) Weight: 88.5 kg BMI (Calculated): 25.73   Intake/Output this shift:   Intake/Output Summary (Last 24 hours) at 01/11/2021 1226 Last data filed at 01/11/2021 0900 Gross per 24 hour  Intake 1380 ml  Output 1300 ml  Net 80 ml    Constitutional :  alert, cooperative, appears stated age, and no distress  Respiratory:  clear to auscultation bilaterally  Cardiovascular:  regular rate and rhythm  Gastrointestinal: Soft, non-distended, focal TTP in RUQ/episgastric area, no guarding .   Skin: Cool and moist.   Psychiatric: Normal affect, non-agitated, not confused       LABS:  CMP Latest Ref Rng & Units 01/10/2021 01/09/2021 01/08/2021  Glucose 70 - 99 mg/dL 236(H) 218(H) 197(H)  BUN 6 - 20 mg/dL 14 14 10   Creatinine 0.61 - 1.24 mg/dL 0.91 0.95 0.83  Sodium 135 - 145 mmol/L 138 137 137  Potassium 3.5 - 5.1 mmol/L 3.6 3.2(L) 3.7  Chloride 98 - 111 mmol/L 108 102 103  CO2 22 - 32 mmol/L 19(L) 23 22  Calcium 8.9 -  10.3 mg/dL 8.3(L) 8.6(L) 8.7(L)  Total Protein 6.5 - 8.1 g/dL 6.5 6.6 -  Total Bilirubin 0.3 - 1.2 mg/dL 0.9 1.3(H) -  Alkaline Phos 38 - 126 U/L 138(H) 108 -  AST 15 - 41 U/L 15 15 -  ALT 0 - 44 U/L 46(H) 54(H) -   CBC Latest Ref Rng & Units 01/11/2021 01/10/2021 01/09/2021  WBC 4.0 - 10.5 K/uL 15.8(H) 16.5(H) 19.1(H)  Hemoglobin 13.0 - 17.0 g/dL 9.8(L) 10.1(L) 10.5(L)  Hematocrit 39.0 - 52.0 % 27.9(L) 29.6(L) 30.9(L)  Platelets 150 - 400 K/uL 266 233 227    RADS: N/a Assessment:   Acute cholecystitis, s/p NSTEMI, on brilinta.  Wbc improving. Continue current abx and management for now.  Hopefully acute episodes resolves with abx alone.  Not sure if he is candidate for chole tube placement by IR if abx not enough.  Will continue to monitor.

## 2021-01-11 NOTE — Progress Notes (Signed)
PROGRESS NOTE     Jacob Baker, is a 50 y.o. male, DOB - 1970-08-09, LNL:[id number]  Admit date - 01/07/2021   Admitting Physician Meliyah Simon Denton Brick, MD  Outpatient Primary MD for the patient is [name], MD  LOS - 3  Chief Complaint  Patient presents with   Chest Pain       Brief Narrative:  50 y.o. male with medical history significant for  CAD, HTN, DM. Presented to the ED with persistent abdominal pain that started after he was hospitalized for NSTEMI and underwent cath 2ce- 12/6 and 01/01/21 admitted on 01/07/2021 with right upper quadrant pain and found to have acute cholecystitis  Assessment & Plan:   Principal Problem:   Acute cholecystitis Active Problems:   HTN (hypertension)   Essential hypertension   Hyperglycemia due to diabetes mellitus ([place])   Status post coronary artery stent placement   Intractable abdominal pain   Colicky RUQ abdominal pain   Nausea without vomiting   Elevated LFTs    1) acute cholecystitis--patient presented with right upper quadrant pain as well as leukocytosis and nausea RUQ ultrasound consistent with gallbladder wall thickening, HIDA scan consistent with acute cholecystitis -GI and general surgery input appreciated -not a great candidate for surgery lap chole given recent LHC with DAPT -WBC  23.8>> 19.1 >>16.5>>15.8 Nausea and abdominal pain persist-- --Oral intake is not great -Requiring IV Dilaudid and IV Zofran -Okay to stop IV Rocephin and Flagyl and start IV Zosyn on 01/11/2021 -Given very slow improvement of his acute cholecystitis query if patient is a candidate for IR placement of  cholecystectomy tube  2)CAD--- recent NSTEMI with angioplasty and stents x 2 placement,s/p LHC 12/31/20 and 01/01/21 -Continue aspirin, isosorbide, lisinopril metoprolol and Brilinta -Restart Crestor  3)DM2-A1c 7.4 reflecting uncontrolled DM with hyperglycemia PTA -Continue to hold metformin, Farxiga and glipizide Use Novolog/Humalog  Sliding scale insulin with Accu-Cheks/Fingersticks as ordered   Disposition/Need for in-Hospital Stay- patient unable to be discharged at this time due to --acute cholecystitis requiring IV antibiotics, not a great candidate for surgery given recent LHC with DAPT -Nausea and abdominal pain persist--resulting in  poor oral intake at this time--  -Requiring IV Dilaudid and IV Zofran -Discharge home when pain control improves and tolerating oral intake  Status is: Inpatient  Remains inpatient appropriate because: See disposition above  Disposition: The patient is from: Home              Anticipated d/c is to: Home              Anticipated d/c date is: 2 days              Patient currently is not medically stable to d/c. Barriers: Not Clinically Stable-   Code Status :  -  Code Status: Full Code   Family Communication:    (patient is alert, awake and coherent)  Discussed with wife Consults  :  Gi/Gen surgery  DVT Prophylaxis  :   - SCDs   heparin injection 5,000 Units Start: 01/07/21 2200  Lab Results  Component Value Date   PLT 266 01/11/2021    Inpatient Medications  Scheduled Meds:  amLODipine  5 mg Oral Daily   aspirin EC  81 mg Oral Daily   heparin  5,000 Units Subcutaneous Q8H   insulin aspart  0-20 Units Subcutaneous TID WC   insulin aspart  0-5 Units Subcutaneous QHS   isosorbide mononitrate  30 mg Oral Daily   lisinopril  20  mg Oral Daily   metoprolol tartrate  25 mg Oral BID   ondansetron  8 mg Oral TID AC   pantoprazole (PROTONIX) IV  40 mg Intravenous Q12H   polyethylene glycol  17 g Oral BID   Ensure Max Protein  11 oz Oral BID   senna-docusate  2 tablet Oral QHS   ticagrelor  90 mg Oral BID   Continuous Infusions:  sodium chloride     piperacillin-tazobactam (ZOSYN)  IV 3.375 g (01/11/21 1335)   promethazine (PHENERGAN) injection (IM or IVPB)     PRN Meds:.sodium chloride, acetaminophen, HYDROmorphone (DILAUDID) injection, labetalol,  oxyCODONE-acetaminophen, promethazine (PHENERGAN) injection (IM or IVPB)   Anti-infectives (From admission, onward)    Start     Dose/Rate Route Frequency Ordered Stop   01/11/21 1400  piperacillin-tazobactam (ZOSYN) IVPB 3.375 g  Status:  Discontinued        3.375 g 100 mL/hr over 30 Minutes Intravenous Every 6 hours 01/11/21 0739 01/11/21 0805   01/11/21 0830  piperacillin-tazobactam (ZOSYN) IVPB 3.375 g  Status:  Discontinued        3.375 g 100 mL/hr over 30 Minutes Intravenous  Once 01/11/21 0739 01/11/21 0805   01/11/21 0830  metroNIDAZOLE (FLAGYL) tablet 500 mg        500 mg Oral  Once 01/11/21 0740 01/11/21 0821   01/11/21 0815  piperacillin-tazobactam (ZOSYN) IVPB 3.375 g        3.375 g 12.5 mL/hr over 240 Minutes Intravenous Every 8 hours 01/11/21 0806     01/08/21 2000  metroNIDAZOLE (FLAGYL) IVPB 500 mg  Status:  Discontinued        500 mg 100 mL/hr over 60 Minutes Intravenous Every 12 hours 01/08/21 1721 01/11/21 0740   01/08/21 1800  cefTRIAXone (ROCEPHIN) 2 g in sodium chloride 0.9 % 100 mL IVPB  Status:  Discontinued        2 g 200 mL/hr over 30 Minutes Intravenous Every 24 hours 01/08/21 1652 01/11/21 0739         Subjective: Jacob Baker today has no fevers, no emesis,  No chest pain,   - Oral intake is not great, passing gas, had BM with laxatives -Abdominal pain and nausea persist   Objective: Vitals:   01/10/21 2124 01/11/21 0420 01/11/21 1037 01/11/21 1405  BP: 123/79 (!) 154/80 (!) 143/90 123/70  Pulse: 83 96 96 80  Resp: 17 20  20   Temp: 98.3 F (36.8 C) 98.7 F (37.1 C)  98.6 F (37 C)  TempSrc: Oral Oral  Oral  SpO2: 99% 98%    Weight:      Height:        Intake/Output Summary (Last 24 hours) at 01/11/2021 1429 Last data filed at 01/11/2021 1400 Gross per 24 hour  Intake 1500 ml  Output 1000 ml  Net 500 ml   Filed Weights   01/07/21 1123  Weight: 88.5 kg   Physical Exam  Gen:- Awake Alert,  in no apparent distress  HEENT:-  [place].AT, No sclera icterus Neck-Supple Neck,No JVD,.  Lungs-  CTAB , fair symmetrical air movement CV- S1, S2 normal, regular  Abd-  +ve B.Sounds, Abd Soft, right upper quadrant tenderness, voluntary guarding Extremity/Skin:- No  edema, pedal pulses present  Psych-affect is appropriate, oriented x3 Neuro-no new focal deficits, no tremors  Data Reviewed: I have personally reviewed following labs and imaging studies  CBC: Recent Labs  Lab 01/07/21 1142 01/08/21 0457 01/09/21 0725 01/10/21 0609 01/11/21 0426  WBC 19.0* 23.8* 19.1*  16.5* 15.8*  HGB 13.1 12.4* 10.5* 10.1* 9.8*  HCT 37.2* 35.9* 30.9* 29.6* 27.9*  MCV 82.7 82.3 82.6 83.4 83.8  PLT 265 248 227 233 662   Basic Metabolic Panel: Recent Labs  Lab 01/06/21 2331 01/07/21 1142 01/08/21 0457 01/09/21 0725 01/10/21 0609  NA 137 137 137 137 138  K 3.6 3.9 3.7 3.2* 3.6  CL 106 102 103 102 108  CO2 22 23 22 23  19*  GLUCOSE 165* 224* 197* 218* 236*  BUN 12 11 10 14 14   CREATININE 0.95 0.88 0.83 0.95 0.91  CALCIUM 8.9 9.3 8.7* 8.6* 8.3*   GFR: Estimated Creatinine Clearance: 107.3 mL/min (by C-G formula based on SCr of 0.91 mg/dL). Liver Function Tests: Recent Labs  Lab 01/06/21 2331 01/07/21 1316 01/09/21 0725 01/10/21 0609  AST 15 36 15 15  ALT 30 75* 54* 46*  ALKPHOS 91 119 108 138*  BILITOT 0.8 1.6* 1.3* 0.9  PROT 7.1 7.2 6.6 6.5  ALBUMIN 4.1 4.3 3.1* 3.0*   Recent Labs  Lab 01/06/21 2331 01/07/21 1316  LIPASE 50 26   No results for input(s): AMMONIA in the last 168 hours. Coagulation Profile: No results for input(s): INR, PROTIME in the last 168 hours. Cardiac Enzymes: No results for input(s): CKTOTAL, CKMB, CKMBINDEX, TROPONINI in the last 168 hours. BNP (last 3 results) No results for input(s): PROBNP in the last 8760 hours. HbA1C: No results for input(s): HGBA1C in the last 72 hours. CBG: Recent Labs  Lab 01/10/21 2151 01/10/21 2346 01/11/21 0412 01/11/21 0725 01/11/21 1158  GLUCAP  173* 208* 185* 195* 215*   Lipid Profile: No results for input(s): CHOL, HDL, LDLCALC, TRIG, CHOLHDL, LDLDIRECT in the last 72 hours. Thyroid Function Tests: No results for input(s): TSH, T4TOTAL, FREET4, T3FREE, THYROIDAB in the last 72 hours. Anemia Panel: No results for input(s): VITAMINB12, FOLATE, FERRITIN, TIBC, IRON, RETICCTPCT in the last 72 hours. Urine analysis:    Component Value Date/Time   COLORURINE Yellow 10/04/2013 2025   APPEARANCEUR Clear 10/04/2013 2025   LABSPEC 1.021 10/04/2013 2025   PHURINE 5.0 10/04/2013 2025   GLUCOSEU Negative 10/04/2013 2025   HGBUR Negative 10/04/2013 2025   BILIRUBINUR Negative 10/04/2013 2025   KETONESUR Negative 10/04/2013 2025   PROTEINUR 30 mg/dL 10/04/2013 2025   NITRITE Negative 10/04/2013 2025   LEUKOCYTESUR Negative 10/04/2013 2025   Sepsis Labs: @LABRCNTIP (procalcitonin:4,lacticidven:4)  ) Recent Results (from the past 240 hour(s))  Culture, blood (Routine X 2) w Reflex to ID Panel     Status: None (Preliminary result)   Collection Time: 01/08/21 10:12 AM   Specimen: BLOOD RIGHT HAND  Result Value Ref Range Status   Specimen Description BLOOD RIGHT HAND  Final   Special Requests   Final    BOTTLES DRAWN AEROBIC AND ANAEROBIC Blood Culture adequate volume   Culture   Final    NO GROWTH 3 DAYS Performed at [facility], [address], [place], [place] [address]    Report Status PENDING  Incomplete  Culture, blood (Routine X 2) w Reflex to ID Panel     Status: None (Preliminary result)   Collection Time: 01/08/21 10:13 AM   Specimen: BLOOD LEFT HAND  Result Value Ref Range Status   Specimen Description BLOOD LEFT HAND  Final   Special Requests   Final    BOTTLES DRAWN AEROBIC ONLY Blood Culture results may not be optimal due to an inadequate volume of blood received in culture bottles   Culture   Final  NO GROWTH 3 DAYS Performed at [facility], [address] [place], [place] [address]    Report Status PENDING   Incomplete      Radiology Studies: No results found.   Scheduled Meds:  amLODipine  5 mg Oral Daily   aspirin EC  81 mg Oral Daily   heparin  5,000 Units Subcutaneous Q8H   insulin aspart  0-20 Units Subcutaneous TID WC   insulin aspart  0-5 Units Subcutaneous QHS   isosorbide mononitrate  30 mg Oral Daily   lisinopril  20 mg Oral Daily   metoprolol tartrate  25 mg Oral BID   ondansetron  8 mg Oral TID AC   pantoprazole (PROTONIX) IV  40 mg Intravenous Q12H   polyethylene glycol  17 g Oral BID   Ensure Max Protein  11 oz Oral BID   senna-docusate  2 tablet Oral QHS   ticagrelor  90 mg Oral BID   Continuous Infusions:  sodium chloride     piperacillin-tazobactam (ZOSYN)  IV 3.375 g (01/11/21 1335)   promethazine (PHENERGAN) injection (IM or IVPB)       LOS: 3 days    Roxan Hockey M.D on 01/11/2021 at 2:29 PM  Go to [email or web address] - for contact info  [facility] - Office  [phone]  If 7PM-7AM, please contact night-coverage [email or web address] Password [email or web address] 01/11/2021, 2:29 PM

## 2021-01-12 LAB — GLUCOSE, CAPILLARY
Glucose-Capillary: 202 mg/dL — ABNORMAL HIGH (ref 70–99)
Glucose-Capillary: 196 mg/dL — ABNORMAL HIGH (ref 70–99)
Glucose-Capillary: 187 mg/dL — ABNORMAL HIGH (ref 70–99)
Glucose-Capillary: 222 mg/dL — ABNORMAL HIGH (ref 70–99)

## 2021-01-12 LAB — CBC
HCT: 29 % — ABNORMAL LOW (ref 39.0–52.0)
Hemoglobin: 9.9 g/dL — ABNORMAL LOW (ref 13.0–17.0)
MCH: 28 pg (ref 26.0–34.0)
MCHC: 34.1 g/dL (ref 30.0–36.0)
MCV: 81.9 fL (ref 80.0–100.0)
Platelets: 304 10*3/uL (ref 150–400)
RBC: 3.54 MIL/uL — ABNORMAL LOW (ref 4.22–5.81)
RDW: 13.7 % (ref 11.5–15.5)
WBC: 14 10*3/uL — ABNORMAL HIGH (ref 4.0–10.5)
nRBC: 0 % (ref 0.0–0.2)

## 2021-01-12 LAB — COMPREHENSIVE METABOLIC PANEL
ALT: 37 U/L (ref 0–44)
AST: 16 U/L (ref 15–41)
Albumin: 2.7 g/dL — ABNORMAL LOW (ref 3.5–5.0)
Alkaline Phosphatase: 218 U/L — ABNORMAL HIGH (ref 38–126)
Anion gap: 10 (ref 5–15)
BUN: 14 mg/dL (ref 6–20)
CO2: 22 mmol/L (ref 22–32)
Calcium: 8.4 mg/dL — ABNORMAL LOW (ref 8.9–10.3)
Chloride: 107 mmol/L (ref 98–111)
Creatinine, Ser: 0.9 mg/dL (ref 0.61–1.24)
GFR, Estimated: >60 mL/min (ref >60)
Glucose, Bld: 187 mg/dL — ABNORMAL HIGH (ref 70–99)
Potassium: 3.3 mmol/L — ABNORMAL LOW (ref 3.5–5.1)
Sodium: 139 mmol/L (ref 135–145)
Total Bilirubin: 1.2 mg/dL (ref 0.3–1.2)
Total Protein: 6.4 g/dL — ABNORMAL LOW (ref 6.5–8.1)

## 2021-01-12 MED ORDER — POTASSIUM CHLORIDE CRYS ER 20 MEQ PO TBCR
40.0000 meq | EXTENDED_RELEASE_TABLET | ORAL | Status: AC
  Administered 2021-01-12 (×2): 40 meq via ORAL
  Filled 2021-01-12 (×2): qty 2

## 2021-01-12 MED ORDER — CARVEDILOL 12.5 MG PO TABS
12.5000 mg | ORAL_TABLET | Freq: Two times a day (BID) | ORAL | Status: DC
  Administered 2021-01-12 – 2021-01-13 (×3): 12.5 mg via ORAL
  Filled 2021-01-12 (×3): qty 1

## 2021-01-12 NOTE — Progress Notes (Signed)
Subjective:  CC: Jacob Baker is a 50 y.o. male  Hospital stay day 4,   acute cholecystitis  HPI: No acute issues overnight.  Still having pain,  But no issue with diet, having BM  ROS:  General: Denies weight loss, weight gain, fatigue, fevers, chills, and night sweats. Heart: Denies chest pain, palpitations, racing heart, irregular heartbeat, leg pain or swelling, and decreased activity tolerance. Respiratory: Denies breathing difficulty, shortness of breath, wheezing, cough, and sputum. GI: Denies change in appetite, heartburn, nausea, vomiting, constipation, diarrhea, and blood in stool. GU: Denies difficulty urinating, pain with urinating, urgency, frequency, blood in urine.   Objective:   Temp:  [98.1 F (36.7 C)-98.6 F (37 C)] 98.3 F (36.8 C) (12/18 0430) Pulse Rate:  [80-91] 90 (12/18 0937) Resp:  [18-20] 18 (12/18 0430) BP: (123-154)/(70-95) 133/83 (12/18 0937) SpO2:  [95 %-96 %] 95 % (12/18 0430)     Height: 6\' 1"  (185.4 cm) Weight: 88.5 kg BMI (Calculated): 25.73   Intake/Output this shift:   Intake/Output Summary (Last 24 hours) at 01/12/2021 1105 Last data filed at 01/12/2021 0900 Gross per 24 hour  Intake 741.32 ml  Output 1400 ml  Net -658.68 ml    Constitutional :  alert, cooperative, appears stated age, and no distress  Respiratory:  clear to auscultation bilaterally  Cardiovascular:  regular rate and rhythm  Gastrointestinal: Soft, non-distended, focal TTP in RUQ/episgastric area, no guarding .   Skin: Cool and moist.   Psychiatric: Normal affect, non-agitated, not confused       LABS:  CMP Latest Ref Rng & Units 01/12/2021 01/10/2021 01/09/2021  Glucose 70 - 99 mg/dL 187(H) 236(H) 218(H)  BUN 6 - 20 mg/dL 14 14 14   Creatinine 0.61 - 1.24 mg/dL 0.90 0.91 0.95  Sodium 135 - 145 mmol/L 139 138 137  Potassium 3.5 - 5.1 mmol/L 3.3(L) 3.6 3.2(L)  Chloride 98 - 111 mmol/L 107 108 102  CO2 22 - 32 mmol/L 22 19(L) 23  Calcium 8.9 - 10.3 mg/dL 8.4(L)  8.3(L) 8.6(L)  Total Protein 6.5 - 8.1 g/dL 6.4(L) 6.5 6.6  Total Bilirubin 0.3 - 1.2 mg/dL 1.2 0.9 1.3(H)  Alkaline Phos 38 - 126 U/L 218(H) 138(H) 108  AST 15 - 41 U/L 16 15 15   ALT 0 - 44 U/L 37 46(H) 54(H)   CBC Latest Ref Rng & Units 01/12/2021 01/11/2021 01/10/2021  WBC 4.0 - 10.5 K/uL 14.0(H) 15.8(H) 16.5(H)  Hemoglobin 13.0 - 17.0 g/dL 9.9(L) 9.8(L) 10.1(L)  Hematocrit 39.0 - 52.0 % 29.0(L) 27.9(L) 29.6(L)  Platelets 150 - 400 K/uL 304 266 233    RADS: N/a Assessment:   Acute cholecystitis, s/p NSTEMI, on brilinta.  Wbc continues to improve, reassuring. Continue current abx and management for now.

## 2021-01-12 NOTE — Progress Notes (Signed)
PROGRESS NOTE     Jacob Baker, is a 50 y.o. male, DOB - 30-Jun-1970, PJK:[id number]  Admit date - 01/07/2021   Admitting Physician Kenai Fluegel Denton Brick, MD  Outpatient Primary MD for the patient is [name], MD  LOS - 4  Chief Complaint  Patient presents with   Chest Pain       Brief Narrative:  50 y.o. male with medical history significant for  CAD, HTN, DM. Presented to the ED with persistent abdominal pain that started after he was hospitalized for NSTEMI and underwent cath 2ce- 12/6 and 01/01/21 admitted on 01/07/2021 with right upper quadrant pain and found to have acute cholecystitis  Assessment & Plan:   Principal Problem:   Acute cholecystitis Active Problems:   HTN (hypertension)   Essential hypertension   Hyperglycemia due to diabetes mellitus ([place])   Status post coronary artery stent placement   Intractable abdominal pain   Colicky RUQ abdominal pain   Nausea without vomiting   Elevated LFTs    1) acute cholecystitis--patient presented with right upper quadrant pain as well as leukocytosis and nausea RUQ ultrasound consistent with gallbladder wall thickening, HIDA scan consistent with acute cholecystitis -GI and general surgery input appreciated -Not a great candidate for surgery lap chole given recent LHC with DAPT -WBC  23.8>> 19.1 >>16.5>>15.8>.14.0 T Bil is 1.6 >>1.2 ALT 75 >>37, AST 36 >> 16 Nausea and abdominal pain persist-- -Requiring IV Dilaudid and IV Zofran -Stopped IV Rocephin and Flagyl and Started IV Zosyn on 01/11/2021 -slow improvement of his acute cholecystitis -??? if patient is a candidate for IR placement of  cholecystectomy tube ( he is on DAPT)  2)CAD--- recent NSTEMI with angioplasty and stents x 2 placement,s/p LHC 12/31/20 and 01/01/21 -Continue aspirin, isosorbide, lisinopril metoprolol and Brilinta -c/n  Crestor  3)DM2-A1c 7.4 reflecting uncontrolled DM with hyperglycemia PTA -Continue to hold metformin, Farxiga and  glipizide Use Novolog/Humalog Sliding scale insulin with Accu-Cheks/Fingersticks as ordered   4) hypokalemia--replace and recheck  Disposition/Need for in-Hospital Stay- patient unable to be discharged at this time due to --acute cholecystitis requiring IV antibiotics, not a great candidate for surgery given recent LHC with DAPT -Nausea and abdominal pain persist--resulting in  poor oral intake at this time--  -Requiring IV Dilaudid and IV Zofran -Possible discharge home in 24 to 48 hours if nausea and abdominal pain improved and remains afebrile  Status is: Inpatient  Remains inpatient appropriate because: See disposition above  Disposition: The patient is from: Home              Anticipated d/c is to: Home              Anticipated d/c date is: 2 days              Patient currently is not medically stable to d/c. Barriers: Not Clinically Stable-   Code Status :  -  Code Status: Full Code   Family Communication:    (patient is alert, awake and coherent)  Discussed with wife Consults  :  Gi/Gen surgery  DVT Prophylaxis  :   - SCDs   heparin injection 5,000 Units Start: 01/07/21 2200  Lab Results  Component Value Date   PLT 304 01/12/2021    Inpatient Medications  Scheduled Meds:  amLODipine  5 mg Oral Daily   aspirin EC  81 mg Oral Daily   carvedilol  12.5 mg Oral BID WC   heparin  5,000 Units Subcutaneous Q8H   insulin  aspart  0-20 Units Subcutaneous TID WC   insulin aspart  0-5 Units Subcutaneous QHS   isosorbide mononitrate  30 mg Oral Daily   lisinopril  20 mg Oral Daily   ondansetron  8 mg Oral TID AC   pantoprazole (PROTONIX) IV  40 mg Intravenous Q12H   polyethylene glycol  17 g Oral BID   Ensure Max Protein  11 oz Oral BID   rosuvastatin  40 mg Oral Daily   senna-docusate  2 tablet Oral QHS   ticagrelor  90 mg Oral BID   Continuous Infusions:  sodium chloride     piperacillin-tazobactam (ZOSYN)  IV 3.375 g (01/12/21 1355)   promethazine (PHENERGAN)  injection (IM or IVPB)     PRN Meds:.sodium chloride, acetaminophen, HYDROmorphone (DILAUDID) injection, labetalol, oxyCODONE-acetaminophen, promethazine (PHENERGAN) injection (IM or IVPB)   Anti-infectives (From admission, onward)    Start     Dose/Rate Route Frequency Ordered Stop   01/11/21 1400  piperacillin-tazobactam (ZOSYN) IVPB 3.375 g  Status:  Discontinued        3.375 g 100 mL/hr over 30 Minutes Intravenous Every 6 hours 01/11/21 0739 01/11/21 0805   01/11/21 0830  piperacillin-tazobactam (ZOSYN) IVPB 3.375 g  Status:  Discontinued        3.375 g 100 mL/hr over 30 Minutes Intravenous  Once 01/11/21 0739 01/11/21 0805   01/11/21 0830  metroNIDAZOLE (FLAGYL) tablet 500 mg        500 mg Oral  Once 01/11/21 0740 01/11/21 0821   01/11/21 0815  piperacillin-tazobactam (ZOSYN) IVPB 3.375 g        3.375 g 12.5 mL/hr over 240 Minutes Intravenous Every 8 hours 01/11/21 0806     01/08/21 2000  metroNIDAZOLE (FLAGYL) IVPB 500 mg  Status:  Discontinued        500 mg 100 mL/hr over 60 Minutes Intravenous Every 12 hours 01/08/21 1721 01/11/21 0740   01/08/21 1800  cefTRIAXone (ROCEPHIN) 2 g in sodium chloride 0.9 % 100 mL IVPB  Status:  Discontinued        2 g 200 mL/hr over 30 Minutes Intravenous Every 24 hours 01/08/21 1652 01/11/21 0739         Subjective: Jacob Baker today has no fevers, no emesis,  No chest pain,   - Nausea is improved, chronic tach improving slightly, - Abdominal pain is not worse   Objective: Vitals:   01/12/21 0820 01/12/21 0937 01/12/21 1330 01/12/21 1719  BP: (!) 154/92 133/83 116/86 128/90  Pulse: 90 90 82 91  Resp:      Temp:   98.2 F (36.8 C)   TempSrc:   Oral   SpO2:   99%   Weight:      Height:        Intake/Output Summary (Last 24 hours) at 01/12/2021 1847 Last data filed at 01/12/2021 1700 Gross per 24 hour  Intake 720 ml  Output 1800 ml  Net -1080 ml   Filed Weights   01/07/21 1123  Weight: 88.5 kg   Physical  Exam  Gen:- Awake Alert,  in no apparent distress  HEENT:- [place].AT, No sclera icterus Neck-Supple Neck,No JVD,.  Lungs-  CTAB , fair symmetrical air movement CV- S1, S2 normal, regular  Abd-  +ve B.Sounds, Abd Soft, right upper quadrant tenderness, voluntary guarding Extremity/Skin:- No  edema, pedal pulses present  Psych-affect is appropriate, oriented x3 Neuro-no new focal deficits, no tremors  Data Reviewed: I have personally reviewed following labs and imaging studies  CBC:  Recent Labs  Lab 01/08/21 0457 01/09/21 0725 01/10/21 0609 01/11/21 0426 01/12/21 0508  WBC 23.8* 19.1* 16.5* 15.8* 14.0*  HGB 12.4* 10.5* 10.1* 9.8* 9.9*  HCT 35.9* 30.9* 29.6* 27.9* 29.0*  MCV 82.3 82.6 83.4 83.8 81.9  PLT 248 227 233 266 818   Basic Metabolic Panel: Recent Labs  Lab 01/07/21 1142 01/08/21 0457 01/09/21 0725 01/10/21 0609 01/12/21 0508  NA 137 137 137 138 139  K 3.9 3.7 3.2* 3.6 3.3*  CL 102 103 102 108 107  CO2 23 22 23  19* 22  GLUCOSE 224* 197* 218* 236* 187*  BUN 11 10 14 14 14   CREATININE 0.88 0.83 0.95 0.91 0.90  CALCIUM 9.3 8.7* 8.6* 8.3* 8.4*   GFR: Estimated Creatinine Clearance: 108.5 mL/min (by C-G formula based on SCr of 0.9 mg/dL). Liver Function Tests: Recent Labs  Lab 01/06/21 2331 01/07/21 1316 01/09/21 0725 01/10/21 0609 01/12/21 0508  AST 15 36 15 15 16   ALT 30 75* 54* 46* 37  ALKPHOS 91 119 108 138* 218*  BILITOT 0.8 1.6* 1.3* 0.9 1.2  PROT 7.1 7.2 6.6 6.5 6.4*  ALBUMIN 4.1 4.3 3.1* 3.0* 2.7*   Recent Labs  Lab 01/06/21 2331 01/07/21 1316  LIPASE 50 26   No results for input(s): AMMONIA in the last 168 hours. Coagulation Profile: No results for input(s): INR, PROTIME in the last 168 hours. Cardiac Enzymes: No results for input(s): CKTOTAL, CKMB, CKMBINDEX, TROPONINI in the last 168 hours. BNP (last 3 results) No results for input(s): PROBNP in the last 8760 hours. HbA1C: No results for input(s): HGBA1C in the last 72  hours. CBG: Recent Labs  Lab 01/11/21 1608 01/11/21 2040 01/12/21 0724 01/12/21 1124 01/12/21 1648  GLUCAP 171* 162* 202* 196* 187*   Lipid Profile: No results for input(s): CHOL, HDL, LDLCALC, TRIG, CHOLHDL, LDLDIRECT in the last 72 hours. Thyroid Function Tests: No results for input(s): TSH, T4TOTAL, FREET4, T3FREE, THYROIDAB in the last 72 hours. Anemia Panel: No results for input(s): VITAMINB12, FOLATE, FERRITIN, TIBC, IRON, RETICCTPCT in the last 72 hours. Urine analysis:    Component Value Date/Time   COLORURINE Yellow 10/04/2013 2025   APPEARANCEUR Clear 10/04/2013 2025   LABSPEC 1.021 10/04/2013 2025   PHURINE 5.0 10/04/2013 2025   GLUCOSEU Negative 10/04/2013 2025   HGBUR Negative 10/04/2013 2025   BILIRUBINUR Negative 10/04/2013 2025   KETONESUR Negative 10/04/2013 2025   PROTEINUR 30 mg/dL 10/04/2013 2025   NITRITE Negative 10/04/2013 2025   LEUKOCYTESUR Negative 10/04/2013 2025   Sepsis Labs: @LABRCNTIP (procalcitonin:4,lacticidven:4)  ) Recent Results (from the past 240 hour(s))  Culture, blood (Routine X 2) w Reflex to ID Panel     Status: None (Preliminary result)   Collection Time: 01/08/21 10:12 AM   Specimen: BLOOD RIGHT HAND  Result Value Ref Range Status   Specimen Description BLOOD RIGHT HAND  Final   Special Requests   Final    BOTTLES DRAWN AEROBIC AND ANAEROBIC Blood Culture adequate volume   Culture   Final    NO GROWTH 3 DAYS Performed at [facility], [address] [place], [place] [address]    Report Status PENDING  Incomplete  Culture, blood (Routine X 2) w Reflex to ID Panel     Status: None (Preliminary result)   Collection Time: 01/08/21 10:13 AM   Specimen: BLOOD LEFT HAND  Result Value Ref Range Status   Specimen Description BLOOD LEFT HAND  Final   Special Requests   Final    BOTTLES  DRAWN AEROBIC ONLY Blood Culture results may not be optimal due to an inadequate volume of blood received in culture bottles   Culture   Final     NO GROWTH 3 DAYS Performed at [facility], [address], [place], [place] [address]    Report Status PENDING  Incomplete      Radiology Studies: No results found.   Scheduled Meds:  amLODipine  5 mg Oral Daily   aspirin EC  81 mg Oral Daily   carvedilol  12.5 mg Oral BID WC   heparin  5,000 Units Subcutaneous Q8H   insulin aspart  0-20 Units Subcutaneous TID WC   insulin aspart  0-5 Units Subcutaneous QHS   isosorbide mononitrate  30 mg Oral Daily   lisinopril  20 mg Oral Daily   ondansetron  8 mg Oral TID AC   pantoprazole (PROTONIX) IV  40 mg Intravenous Q12H   polyethylene glycol  17 g Oral BID   Ensure Max Protein  11 oz Oral BID   rosuvastatin  40 mg Oral Daily   senna-docusate  2 tablet Oral QHS   ticagrelor  90 mg Oral BID   Continuous Infusions:  sodium chloride     piperacillin-tazobactam (ZOSYN)  IV 3.375 g (01/12/21 1355)   promethazine (PHENERGAN) injection (IM or IVPB)       LOS: 4 days   Roxan Hockey M.D on 01/12/2021 at 6:47 PM  Go to [email or web address] - for contact info  [facility] - Office  [phone]  If 7PM-7AM, please contact night-coverage [email or web address] Password [facility] 01/12/2021, 6:47 PM

## 2021-01-13 LAB — CULTURE, BLOOD (ROUTINE X 2)
Culture: NO GROWTH
Culture: NO GROWTH
Special Requests: ADEQUATE

## 2021-01-13 LAB — COMPREHENSIVE METABOLIC PANEL
ALT: 37 U/L (ref 0–44)
AST: 21 U/L (ref 15–41)
Albumin: 2.7 g/dL — ABNORMAL LOW (ref 3.5–5.0)
Alkaline Phosphatase: 260 U/L — ABNORMAL HIGH (ref 38–126)
Anion gap: 10 (ref 5–15)
BUN: 14 mg/dL (ref 6–20)
CO2: 20 mmol/L — ABNORMAL LOW (ref 22–32)
Calcium: 8.3 mg/dL — ABNORMAL LOW (ref 8.9–10.3)
Chloride: 106 mmol/L (ref 98–111)
Creatinine, Ser: 0.91 mg/dL (ref 0.61–1.24)
GFR, Estimated: >60 mL/min (ref >60)
Glucose, Bld: 200 mg/dL — ABNORMAL HIGH (ref 70–99)
Potassium: 3.5 mmol/L (ref 3.5–5.1)
Sodium: 136 mmol/L (ref 135–145)
Total Bilirubin: 0.9 mg/dL (ref 0.3–1.2)
Total Protein: 6.6 g/dL (ref 6.5–8.1)

## 2021-01-13 LAB — CBC
HCT: 28.2 % — ABNORMAL LOW (ref 39.0–52.0)
Hemoglobin: 9.4 g/dL — ABNORMAL LOW (ref 13.0–17.0)
MCH: 27.6 pg (ref 26.0–34.0)
MCHC: 33.3 g/dL (ref 30.0–36.0)
MCV: 82.7 fL (ref 80.0–100.0)
Platelets: 321 10*3/uL (ref 150–400)
RBC: 3.41 MIL/uL — ABNORMAL LOW (ref 4.22–5.81)
RDW: 14.2 % (ref 11.5–15.5)
WBC: 11.1 10*3/uL — ABNORMAL HIGH (ref 4.0–10.5)
nRBC: 0 % (ref 0.0–0.2)

## 2021-01-13 LAB — GLUCOSE, CAPILLARY
Glucose-Capillary: 209 mg/dL — ABNORMAL HIGH (ref 70–99)
Glucose-Capillary: 175 mg/dL — ABNORMAL HIGH (ref 70–99)

## 2021-01-13 MED ORDER — AMLODIPINE BESYLATE 10 MG PO TABS: 10.0000 mg | ORAL_TABLET | Freq: Every day | ORAL | 1 refills | Status: DC

## 2021-01-13 MED ORDER — PANTOPRAZOLE SODIUM 40 MG PO TBEC: 40.0000 mg | DELAYED_RELEASE_TABLET | Freq: Every day | ORAL | 5 refills | Status: DC

## 2021-01-13 MED ORDER — ROSUVASTATIN CALCIUM 40 MG PO TABS: 40.0000 mg | ORAL_TABLET | Freq: Every day | ORAL | 3 refills | Status: DC

## 2021-01-13 MED ORDER — OXYCODONE-ACETAMINOPHEN 7.5-325 MG PO TABS: 1.0000 | ORAL_TABLET | Freq: Four times a day (QID) | ORAL | 0 refills | Status: DC | PRN

## 2021-01-13 MED ORDER — METFORMIN HCL 850 MG PO TABS: ORAL_TABLET | ORAL | 3 refills | Status: DC

## 2021-01-13 MED ORDER — SENNOSIDES-DOCUSATE SODIUM 8.6-50 MG PO TABS: 2.0000 | ORAL_TABLET | Freq: Every day | ORAL | 0 refills | Status: DC

## 2021-01-13 MED ORDER — AMOXICILLIN-POT CLAVULANATE 875-125 MG PO TABS: 1.0000 | ORAL_TABLET | Freq: Two times a day (BID) | ORAL | 0 refills | Status: AC

## 2021-01-13 MED ORDER — TICAGRELOR 90 MG PO TABS: 90.0000 mg | ORAL_TABLET | Freq: Two times a day (BID) | ORAL | 11 refills | Status: DC

## 2021-01-13 MED ORDER — ONDANSETRON HCL 4 MG PO TABS: 4.0000 mg | ORAL_TABLET | Freq: Every day | ORAL | 1 refills | Status: DC | PRN

## 2021-01-13 MED ORDER — PIPERACILLIN-TAZOBACTAM 3.375 G IVPB 30 MIN
3.3750 g | Freq: Once | INTRAVENOUS | Status: AC
  Administered 2021-01-13: 12:00:00 3.375 g via INTRAVENOUS
  Filled 2021-01-13: qty 50

## 2021-01-13 MED ORDER — CARVEDILOL 12.5 MG PO TABS: 12.5000 mg | ORAL_TABLET | Freq: Two times a day (BID) | ORAL | 3 refills | Status: DC

## 2021-01-13 MED ORDER — LISINOPRIL 20 MG PO TABS: 20.0000 mg | ORAL_TABLET | Freq: Every day | ORAL | 11 refills | Status: DC

## 2021-01-13 MED ORDER — ACETAMINOPHEN 325 MG PO TABS
650.0000 mg | ORAL_TABLET | Freq: Four times a day (QID) | ORAL | 0 refills | Status: DC | PRN
Start: 2021-01-13 — End: 2021-07-01

## 2021-01-13 MED ORDER — ISOSORBIDE MONONITRATE ER 30 MG PO TB24: 30.0000 mg | ORAL_TABLET | Freq: Every day | ORAL | 5 refills | Status: DC

## 2021-01-13 MED ORDER — ASPIRIN 81 MG PO TBEC: 81.0000 mg | DELAYED_RELEASE_TABLET | Freq: Every day | ORAL | 3 refills | Status: AC

## 2021-01-13 NOTE — Discharge Instructions (Addendum)
1)Please take medications as prescribed including Augmentin/Amoxicillin antibiotic  2)You are taking aspirin and Brillanta which are blood thinners so please Avoid ibuprofen/Advil/Aleve/Motrin/Goody Powders/Naproxen/BC powders/Meloxicam/Diclofenac/Indomethacin and other Nonsteroidal anti-inflammatory medications as these will make you more likely to bleed and can cause stomach ulcers, can also cause Kidney problems.   3) keep your appointment with your cardiologist as previously advised  4) you most likely need gallbladder surgery about 6 months from now or later

## 2021-01-13 NOTE — Progress Notes (Signed)
Patient discharged home. IV removed, discharge paper work given and reviewed with patient. Patient stable upon discharge.

## 2021-01-13 NOTE — Discharge Summary (Signed)
Jacob Baker, is a 50 y.o. male  DOB 1970-12-19  MRN [id number].  Admission date:  01/07/2021  Admitting Physician  Roxan Hockey, MD  Discharge Date:  01/13/2021   Primary MD  [name], MD  Recommendations for primary care physician for things to follow:   1)Please take medications as prescribed including Augmentin/Amoxicillin antibiotic  2)You are taking aspirin and Brillanta which are blood thinners so please Avoid ibuprofen/Advil/Aleve/Motrin/Goody Powders/Naproxen/BC powders/Meloxicam/Diclofenac/Indomethacin and other Nonsteroidal anti-inflammatory medications as these will make you more likely to bleed and can cause stomach ulcers, can also cause Kidney problems.   3) keep your appointment with your cardiologist as previously advised  4) you most likely need gallbladder surgery about 6 months from now or later   Admission Diagnosis  Epigastric pain [R10.13] Generalized abdominal pain [R10.84] Intractable abdominal pain [R10.9] Acute cholecystitis [K81.0]  Discharge Diagnosis  Epigastric pain [R10.13] Generalized abdominal pain [R10.84] Intractable abdominal pain [R10.9] Acute cholecystitis [K81.0]    Principal Problem:   Acute cholecystitis Active Problems:   CAD/Status post coronary artery stent placement 12/2020   Essential hypertension   Hyperglycemia due to diabetes mellitus ([place])   Intractable abdominal pain   Colicky RUQ abdominal pain   Nausea without vomiting   Elevated LFTs      Past Medical History:  Diagnosis Date   CHF (congestive heart failure) (H[facility])    Diabetes mellitus without complication ([place])    DVT (deep venous thrombosis) ([place]) 04/2017   GERD (gastroesophageal reflux disease)    History of migraine    none in over 2 yrs   Hyperlipidemia    Hypertension    Syncope and collapse    x1 - approx 2-3 yrs ago - dehydration    Past Surgical History:   Procedure Laterality Date   COLONOSCOPY     COLONOSCOPY WITH PROPOFOL N/A 08/06/2017   Procedure: COLONOSCOPY WITH PROPOFOL;  Surgeon: Lucilla Lame, MD;  Location: [place];  Service: Endoscopy;  Laterality: N/A;  Diabetic - oral meds   CORONARY STENT INTERVENTION N/A 12/31/2020   Procedure: CORONARY STENT INTERVENTION;  Surgeon: Belva Crome, MD;  Location: [place] [facility];  Service: Cardiovascular;  Laterality: N/A;   CORONARY STENT INTERVENTION N/A 01/01/2021   Procedure: CORONARY STENT INTERVENTION;  Surgeon: [name], MD;  Location: [place] [facility];  Service: Cardiovascular;  Laterality: N/A;   ESOPHAGEAL DILATION  09/24/2016   Procedure: ESOPHAGEAL DILATION;  Surgeon: Lucilla Lame, MD;  Location: [place];  Service: Gastroenterology;;   ESOPHAGOGASTRODUODENOSCOPY (EGD) WITH PROPOFOL N/A 04/08/2015   Procedure: ESOPHAGOGASTRODUODENOSCOPY (EGD) WITH PROPOFOL with dialation;  Surgeon: Lucilla Lame, MD;  Location: [place];  Service: Endoscopy;  Laterality: N/A;  diabetic - oral meds   ESOPHAGOGASTRODUODENOSCOPY (EGD) WITH PROPOFOL N/A 09/24/2016   Procedure: ESOPHAGOGASTRODUODENOSCOPY (EGD) WITH PROPOFOL WITH DILATION;  Surgeon: Lucilla Lame, MD;  Location: [place];  Service: Gastroenterology;  Laterality: N/A;  Diabetic - oral meds   FINGER AMPUTATION  2009   partial removal  left index finger    LEFT HEART CATH AND CORONARY ANGIOGRAPHY N/A 12/31/2020   Procedure: LEFT HEART CATH AND CORONARY ANGIOGRAPHY;  Surgeon: Belva Crome, MD;  Location: [place] [facility];  Service: Cardiovascular;  Laterality: N/A;   LEFT HEART CATH AND CORONARY ANGIOGRAPHY N/A 01/01/2021   Procedure: LEFT HEART CATH AND CORONARY ANGIOGRAPHY;  Surgeon: [name], MD;  Location: [place] [facility];  Service: Cardiovascular;  Laterality: N/A;   POLYPECTOMY  08/06/2017   Procedure: POLYPECTOMY INTESTINAL;  Surgeon: Lucilla Lame, MD;  Location: [place];  Service: Endoscopy;;     HPI  from the history and physical done on the day of admission:    Chief Complaint: Abdominal pain   HPI: Jacob Baker is a 50 y.o. male with medical history significant for  CAD, HTN, DM. Presented to the ED with persistent abdominal pain that started after he was hospitalized for NSTEMI and underwent cath 2ce- 12/6 and 12/7.  No vomiting, no loose stool.  Reports abdominal pain is worsened in severity.  Pain is mostly in his upper abdomen.  He denies black stool.  No blood in stools.  Severity of pain is not affected by food intake.  This pain is different from the recent pain he had with his heart attack, he'd had chest pains then and that has resolved. Reports NSAID use 800mg  about 3 times a week on average for headaches.   This is patient's third visit to the ED in the past 5 days for abdominal pain.  His last visit to the ED was yesterday 12/12, work-up included CT abdomen and pelvis with contrast, CTA to rule out PE contrast which were unremarkable.  Troponins were downtrending from recent hospitalization.  ED provider talked to cardiologist on-call Dr. Clayton Bibles, low suspicion for in-stent stenosis, patient has residual disease and it would not be uncommon to have ongoing angina, but reassuring that his troponins are downtrending and recommended better control of patient's blood pressure.   ED Course: Blood pressure elevated systolic up to 557D.  Temperature 98.1.  Heart rate 71-95.  Respiratory rate 14-21.  O2 sats 100% on room air.  Yesterday continued to downtrend from 46 > 37.  Lipase normal 26.  Slight ALT elevation at 75, T bili 1.6. Imaging today at included 2 view chest x-ray which was unremarkable, right upper quadrant ultrasound-gallbladder wall thickening with dependent sludge and probable tiny stones.  No Pericholecystic fluid or sonographic Murphy sign. IV morphine 4 mg given without improvement in patient's pain.  Hospitalist admit.     Hospital Course:    Brief Narrative:  50 y.o. male with medical history significant for  CAD, HTN, DM. Presented to the ED with persistent abdominal pain that started after he was hospitalized for NSTEMI and underwent cath 2ce- 12/6 and 01/01/21 admitted on 01/07/2021 with right upper quadrant pain and found to have acute cholecystitis    A/p 1)Acute Cholecystitis--patient presented with right upper quadrant pain as well as leukocytosis and nausea --RUQ ultrasound consistent with gallbladder wall thickening, -- HIDA scan consistent with acute cholecystitis -GI and general surgery input appreciated -Not a great candidate for surgery lap chole given recent LHC with DAPT -WBC  23.8>> 19.1 >>16.5>>15.8>.14.0>>11.1 T Bil is 1.6 >>1.2 ALT 75 >>37, AST 36 >> 16 --Treated with IV Dilaudid and IV Zofran -Stopped IV Rocephin and Flagyl and Started IV Zosyn on 01/11/2021 -Significantly improved overall tolerating oral intake well, no emesis abdominal pain is manageable -As  per general surgeon okay to discharge home at this time -If symptoms persist patient may need IR consult for cholecystectomy tube, unable to undergo lap chole due to being on DAPT -Tolerating oral intake well, no fevers -dc home on p.o. Augmentin   2)CAD--- recent NSTEMI with angioplasty and stents x 2 placement,s/p LHC 12/31/20 and 01/01/21 -Continue aspirin, isosorbide, lisinopril metoprolol and Brilinta -c/n  Crestor   3)DM2-A1c 7.4 reflecting uncontrolled DM with hyperglycemia PTA -Continue to hold metformin, Farxiga and glipizide Use Novolog/Humalog Sliding scale insulin with Accu-Cheks/Fingersticks as ordered    4) hypokalemia--replace and recheck   Disposition--Home--plan of care reviewed with patient his brother and his mother atbedside   Disposition: The patient is from: Home              Anticipated d/c is to: Home                Code Status :  -  Code Status: Full Code    Family Communication:    (patient is  alert, awake and coherent)  Also Discussed with wife  -On the day of discharge also discussed with patient brother at bedside Consults  :  Gi/Gen surgery  Discharge Condition: stable  Follow UP--PCP and general surgeon   Diet and Activity recommendation:  As advised  Discharge Instructions    Discharge Instructions     Call MD for:  difficulty breathing, headache or visual disturbances   Complete by: As directed    Call MD for:  persistant dizziness or light-headedness   Complete by: As directed    Call MD for:  persistant nausea and vomiting   Complete by: As directed    Call MD for:  severe uncontrolled pain   Complete by: As directed    Call MD for:  temperature >100.4   Complete by: As directed    Diet - low sodium heart healthy   Complete by: As directed    Diet Carb Modified   Complete by: As directed    Increase activity slowly   Complete by: As directed         Discharge Medications     Allergies as of 01/13/2021   No Known Allergies      Medication List     STOP taking these medications    albuterol 108 (90 Base) MCG/ACT inhaler Commonly known as: VENTOLIN HFA   metoprolol tartrate 25 MG tablet Commonly known as: LOPRESSOR       TAKE these medications    acetaminophen 325 MG tablet Commonly known as: TYLENOL Take 2 tablets (650 mg total) by mouth every 6 (six) hours as needed for mild pain or fever.   amLODipine 10 MG tablet Commonly known as: NORVASC Take 1 tablet (10 mg total) by mouth daily. What changed:  medication strength how much to take   amoxicillin-clavulanate 875-125 MG tablet Commonly known as: Augmentin Take 1 tablet by mouth 2 (two) times daily for 5 days.   aspirin 81 MG EC tablet Take 1 tablet (81 mg total) by mouth daily with breakfast. Swallow whole. What changed: when to take this   bisacodyl 10 MG suppository Commonly known as: Dulcolax Place 1 suppository (10 mg total) rectally as needed for moderate  constipation.   carvedilol 12.5 MG tablet Commonly known as: COREG Take 1 tablet (12.5 mg total) by mouth 2 (two) times daily with a meal.   cyanocobalamin 1000 MCG tablet Take 100 mcg by mouth daily.   Farxiga 10 MG Tabs  tablet Generic drug: dapagliflozin propanediol Take 1 tablet (10 mg total) by mouth daily.   glipiZIDE 5 MG 24 hr tablet Commonly known as: GLUCOTROL XL Take 2 tablets (10 mg total) by mouth daily with breakfast.   glucose blood test strip Commonly known as: OneTouch Verio Test sugars twice daily   isosorbide mononitrate 30 MG 24 hr tablet Commonly known as: IMDUR Take 1 tablet (30 mg total) by mouth daily. For heart What changed: additional instructions   lisinopril 20 MG tablet Commonly known as: ZESTRIL Take 1 tablet (20 mg total) by mouth daily.   metFORMIN 850 MG tablet Commonly known as: GLUCOPHAGE TAKE 1 TABLET(850 MG) BY MOUTH TWICE DAILY WITH A MEAL.   nicotine 21 mg/24hr patch Commonly known as: NICODERM CQ - dosed in mg/24 hours Place 1 patch (21 mg total) onto the skin daily.   nitroGLYCERIN 0.4 MG SL tablet Commonly known as: Nitrostat Place 1 tablet (0.4 mg total) under the tongue every 5 (five) minutes as needed for chest pain.   ondansetron 4 MG tablet Commonly known as: Zofran Take 1 tablet (4 mg total) by mouth daily as needed for nausea or vomiting.   OneTouch Delica Lancets 82N Misc Test sugars twice daily   oxyCODONE-acetaminophen 7.5-325 MG tablet Commonly known as: PERCOCET Take 1 tablet by mouth every 6 (six) hours as needed for moderate pain or severe pain.   pantoprazole 40 MG tablet Commonly known as: Protonix Take 1 tablet (40 mg total) by mouth daily. What changed: See the new instructions.   polyethylene glycol 17 g packet Commonly known as: MiraLax Take 17 g by mouth daily.   rosuvastatin 40 MG tablet Commonly known as: Crestor Take 1 tablet (40 mg total) by mouth daily.   senna-docusate 8.6-50 MG  tablet Commonly known as: Senokot-S Take 2 tablets by mouth at bedtime.   ticagrelor 90 MG Tabs tablet Commonly known as: BRILINTA Take 1 tablet (90 mg total) by mouth 2 (two) times daily.   Trulance 3 MG Tabs Generic drug: Plecanatide Take 3 mg by mouth daily.       Major procedures and Radiology Reports - PLEASE review detailed and final reports for all details, in brief -   DG Chest 2 View  Result Date: 01/07/2021 CLINICAL DATA:  Chest pain and shortness of breath. EXAM: CHEST - 2 VIEW COMPARISON:  CT chest from same day.  Chest x-ray from yesterday. FINDINGS: The heart size and mediastinal contours are within normal limits. Both lungs are clear. The visualized skeletal structures are unremarkable. IMPRESSION: No active cardiopulmonary disease. Electronically Signed   By: Titus Dubin M.D.   On: 01/07/2021 12:18   DG Chest 2 View  Result Date: 01/07/2021 CLINICAL DATA:  Chest pain. EXAM: CHEST - 2 VIEW COMPARISON:  Chest radiograph dated 12/30/2020. FINDINGS: No focal consolidation, pleural effusion, pneumothorax. The cardiac silhouette is within limits. No acute osseous pathology. IMPRESSION: No active cardiopulmonary disease. Electronically Signed   By: Anner Crete M.D.   On: 01/07/2021 00:02   DG Chest 2 View  Result Date: 12/30/2020 CLINICAL DATA:  Chest pain. EXAM: CHEST - 2 VIEW COMPARISON:  Chest radiograph dated 06/11/2020. FINDINGS: The heart size and mediastinal contours are within normal limits. Both lungs are clear. The visualized skeletal structures are unremarkable. IMPRESSION: No active cardiopulmonary disease. Electronically Signed   By: Anner Crete M.D.   On: 12/30/2020 18:36   CT Angio Chest PE W and/or Wo Contrast  Result Date: 01/07/2021 CLINICAL DATA:  Chest pain, status post heart attack with stent placement, upper abdominal pain. EXAM: CT ANGIOGRAPHY CHEST CT ABDOMEN AND PELVIS WITH CONTRAST TECHNIQUE: Multidetector CT imaging of the chest was  performed using the standard protocol during bolus administration of intravenous contrast. Multiplanar CT image reconstructions and MIPs were obtained to evaluate the vascular anatomy. Multidetector CT imaging of the abdomen and pelvis was performed using the standard protocol during bolus administration of intravenous contrast. CONTRAST:  182mL OMNIPAQUE IOHEXOL 350 MG/ML SOLN COMPARISON:  CT abdomen/pelvis dated 01/03/2021. CTA chest abdomen pelvis dated 12/30/2020. FINDINGS: CTA CHEST FINDINGS Cardiovascular: Satisfactory opacification of the bilateral pulmonary arteries to the segmental level. No evidence of pulmonary embolism. Although not tailored for evaluation of the thoracic aorta, there is no evidence of thoracic aortic aneurysm or dissection. The heart is normal in size.  No pericardial effusion. Coronary stent in the 1st diagonal. Coronary atherosclerosis of the right coronary artery. Mediastinum/Nodes: No suspicious mediastinal lymphadenopathy. Visualized thyroid is unremarkable. Lungs/Pleura: Mild dependent atelectasis in the bilateral lower lobes. No focal consolidation. No suspicious pulmonary nodules. No pleural effusion or pneumothorax. Musculoskeletal: Degenerative changes of the thoracic spine. Review of the MIP images confirms the above findings. CT ABDOMEN and PELVIS FINDINGS Hepatobiliary: Liver is within normal limits. Gallbladder is unremarkable. No intrahepatic or extrahepatic ductal dilatation. Pancreas: Within normal limits. Spleen: Within normal limits. Adrenals/Urinary Tract: Adrenal glands are within normal limits. Subcentimeter right renal cysts. Left kidney is within normal limits. No hydronephrosis. Bladder is within normal limits. Stomach/Bowel: Stomach is within normal limits No evidence of bowel obstruction. Normal appendix (series 2/image 69). No colonic wall thickening or inflammatory changes. Vascular/Lymphatic: No evidence of abdominal aortic aneurysm. Atherosclerotic  calcifications of the abdominal aorta and branch vessels. No suspicious abdominopelvic lymphadenopathy. Reproductive: Mild prostatomegaly. Other: No abdominopelvic ascites. Musculoskeletal: Visualized osseous structures are within normal limits. Review of the MIP images confirms the above findings. IMPRESSION: No evidence of pulmonary embolism. No acute findings in the chest, abdomen, or pelvis. No interval change from recent CTs. Electronically Signed   By: Julian Hy M.D.   On: 01/07/2021 01:14   NM Hepatobiliary Liver Func  Result Date: 01/08/2021 CLINICAL DATA:  Upper abdominal pain, RIGHT upper quadrant pain, nausea, vomiting EXAM: NUCLEAR MEDICINE HEPATOBILIARY IMAGING TECHNIQUE: Sequential images of the abdomen were obtained out to 60 minutes following intravenous administration of radiopharmaceutical. RADIOPHARMACEUTICALS:  5.3 mCi Tc-8m  Choletec IV COMPARISON:  CT abdomen and pelvis 01/07/2021, ultrasound RIGHT upper quadrant 01/07/2021 FINDINGS: Prompt tracer extraction from bloodstream indicating normal hepatocellular function. Prompt excretion of tracer into biliary tree. Small bowel visualized at 16 minutes. At 1 hour gallbladder had not visualized. Patient then received 3 mg of morphine IV and imaging was continued for 30 minutes. Gallbladder failed to visualize following morphine administration. Findings are consistent with cystic duct obstruction and acute cholecystitis. IMPRESSION: Patent CBD. Nonvisualization of gallbladder despite morphine administration consistent with acute cholecystitis. Electronically Signed   By: Lavonia Dana M.D.   On: 01/08/2021 15:03   CT ABDOMEN PELVIS W CONTRAST  Result Date: 01/07/2021 CLINICAL DATA:  Chest pain, status post heart attack with stent placement, upper abdominal pain. EXAM: CT ANGIOGRAPHY CHEST CT ABDOMEN AND PELVIS WITH CONTRAST TECHNIQUE: Multidetector CT imaging of the chest was performed using the standard protocol during bolus  administration of intravenous contrast. Multiplanar CT image reconstructions and MIPs were obtained to evaluate the vascular anatomy. Multidetector CT imaging of the abdomen and pelvis was performed using the standard protocol during bolus administration of  intravenous contrast. CONTRAST:  163mL OMNIPAQUE IOHEXOL 350 MG/ML SOLN COMPARISON:  CT abdomen/pelvis dated 01/03/2021. CTA chest abdomen pelvis dated 12/30/2020. FINDINGS: CTA CHEST FINDINGS Cardiovascular: Satisfactory opacification of the bilateral pulmonary arteries to the segmental level. No evidence of pulmonary embolism. Although not tailored for evaluation of the thoracic aorta, there is no evidence of thoracic aortic aneurysm or dissection. The heart is normal in size.  No pericardial effusion. Coronary stent in the 1st diagonal. Coronary atherosclerosis of the right coronary artery. Mediastinum/Nodes: No suspicious mediastinal lymphadenopathy. Visualized thyroid is unremarkable. Lungs/Pleura: Mild dependent atelectasis in the bilateral lower lobes. No focal consolidation. No suspicious pulmonary nodules. No pleural effusion or pneumothorax. Musculoskeletal: Degenerative changes of the thoracic spine. Review of the MIP images confirms the above findings. CT ABDOMEN and PELVIS FINDINGS Hepatobiliary: Liver is within normal limits. Gallbladder is unremarkable. No intrahepatic or extrahepatic ductal dilatation. Pancreas: Within normal limits. Spleen: Within normal limits. Adrenals/Urinary Tract: Adrenal glands are within normal limits. Subcentimeter right renal cysts. Left kidney is within normal limits. No hydronephrosis. Bladder is within normal limits. Stomach/Bowel: Stomach is within normal limits No evidence of bowel obstruction. Normal appendix (series 2/image 69). No colonic wall thickening or inflammatory changes. Vascular/Lymphatic: No evidence of abdominal aortic aneurysm. Atherosclerotic calcifications of the abdominal aorta and branch vessels.  No suspicious abdominopelvic lymphadenopathy. Reproductive: Mild prostatomegaly. Other: No abdominopelvic ascites. Musculoskeletal: Visualized osseous structures are within normal limits. Review of the MIP images confirms the above findings. IMPRESSION: No evidence of pulmonary embolism. No acute findings in the chest, abdomen, or pelvis. No interval change from recent CTs. Electronically Signed   By: Julian Hy M.D.   On: 01/07/2021 01:14   CT ABDOMEN PELVIS W CONTRAST  Result Date: 01/03/2021 CLINICAL DATA:  50 year old male with acute abdominal pain which woke him. Recent NSTEMI. EXAM: CT ABDOMEN AND PELVIS WITH CONTRAST TECHNIQUE: Multidetector CT imaging of the abdomen and pelvis was performed using the standard protocol following bolus administration of intravenous contrast. CONTRAST:  161mL OMNIPAQUE IOHEXOL 300 MG/ML  SOLN COMPARISON:  CTA CT Chest, Abdomen, and Pelvis 12/30/2020. CT Abdomen and Pelvis 05/29/2020 and earlier. FINDINGS: Lower chest: Borderline to mild cardiomegaly. No pericardial effusion. Similar mild lung base atelectasis. No pleural effusion. Hepatobiliary: Liver dome included on the delayed images. Some vicarious contrast excretion to the gallbladder. Liver and gallbladder are within normal limits. Pancreas: Negative. Spleen: Negative. Adrenals/Urinary Tract: Negative adrenal glands and kidneys. Symmetric renal enhancement and contrast excretion. Negative ureters. Bladder is somewhat distended but otherwise unremarkable. Pelvic phleboliths. No urinary calculus. Stomach/Bowel: Mildly redundant distal large bowel in the lower abdomen and pelvis with minimal diverticula. Retained stool throughout the colon is increased compared to earlier this month. No large bowel inflammation. Elongated but normal appendix which tracks into the right hemipelvis on series 2, image 69. No dilated small bowel. Negative terminal ileum. Unremarkable stomach and duodenum. No free air or free fluid.  Vascular/Lymphatic: Mostly noncalcified calcified aortic atherosclerosis. Major arterial structures remain patent. Portal venous system is patent. No lymphadenopathy. Reproductive: Negative. Other: No pelvic free fluid. Musculoskeletal: No acute osseous abnormality identified. Degenerated right L5-S1 assimilation joint. Chronic bony overgrowth and osteophytosis at lower thoracic costovertebral junctions. IMPRESSION: No acute or inflammatory process identified in the abdomen or pelvis. Increased retained stool throughout the colon compared to earlier this month. Electronically Signed   By: Genevie Ann M.D.   On: 01/03/2021 05:25   CARDIAC CATHETERIZATION  Result Date: 01/01/2021   Prox Cx lesion is 85% stenosed.  This was a small vessel that was diffusely diseased.   Prox RCA to Mid RCA lesion is 80% stenosed.   A drug-eluting stent was successfully placed using a STENT ONYX FRONTIER 4.0X18, posdilated to 4.5 mm.   Post intervention, there is a 0% residual stenosis.   Dist RCA lesion is 50% stenosed.   Diagonal stent was widely patent.   LV end diastolic pressure is normal.   There is no aortic valve stenosis. Successful PCI of RCA.  He needs aggressive secondary prevention, smoking cessation.  Would consider changing Plavix to Brilinta since he had ACS.   CARDIAC CATHETERIZATION  Result Date: 12/31/2020 CONCLUSIONS: High-grade stenosis in the large mid diagonal branch 99% reduced to 0% with TIMI-3 flow using a 2.0 x 12 Onyx frontier.  Chest discomfort was dissimilar to presenting complaint. Left main is normal LAD has mild diffuse disease Circumflex contains 80 to 90% proximal stenosis.  The vessel is diffusely diseased and gives origin to 2-2 small obtuse marginal branches.  If symptoms continue, perhaps this proximal lesion should be stented. RCA has a "rosary bead "appearance.  It is a large vessel.  There is 60% mid, 60% distal, diffuse disease in the PDA and LV branches. Left ventriculography demonstrated  mid anterior wall hypokinesis.  EF 50%.  LVEDP is normal. RECOMMENDATIONS: Aggressive risk factor modification. Smoking cessation. Monitor for recurrent symptoms and consider PCI of circumflex if symptoms do not abate with PCI of this moderate-sized diagonal branch  ECHOCARDIOGRAM COMPLETE  Result Date: 12/31/2020    ECHOCARDIOGRAM REPORT   Patient Name:   Davionte Percell Miller Date of Exam: 12/31/2020 Medical Rec #:  [id number]      Height:       73.0 in Accession #:    [id number]     Weight:       195.8 lb Date of Birth:  1970/07/25      BSA:          2.132 m Patient Age:    36 years       BP:           141/88 mmHg Patient Gender: M              HR:           75 bpm. Exam Location:  [facility] Procedure: 2D Echo, Cardiac Doppler and Color Doppler Indications:    NSTEMI  History:        Patient has no prior history of Echocardiogram examinations.                 Signs/Symptoms:Chest Pain; Risk Factors:Hypertension, Diabetes,                 Dyslipidemia and Current Smoker.  Sonographer:    Wenda Low Referring Phys: [id number] [place]  1. Left ventricular ejection fraction, by estimation, is 65 to 70%. The left ventricle has normal function. The left ventricle has no regional wall motion abnormalities. There is mild left ventricular hypertrophy. Left ventricular diastolic parameters were normal.  2. Right ventricular systolic function is normal. The right ventricular size is normal. Tricuspid regurgitation signal is inadequate for assessing PA pressure.  3. The mitral valve is normal in structure. Trivial mitral valve regurgitation. No evidence of mitral stenosis.  4. The aortic valve is tricuspid. Aortic valve regurgitation is not visualized. No aortic stenosis is present.  5. The inferior vena cava is normal in size with greater than 50% respiratory variability, suggesting right atrial  pressure of 3 mmHg. FINDINGS  Left Ventricle: Left ventricular ejection fraction, by estimation, is 65 to  70%. The left ventricle has normal function. The left ventricle has no regional wall motion abnormalities. The left ventricular internal cavity size was normal in size. There is  mild left ventricular hypertrophy. Left ventricular diastolic parameters were normal. Right Ventricle: The right ventricular size is normal. No increase in right ventricular wall thickness. Right ventricular systolic function is normal. Tricuspid regurgitation signal is inadequate for assessing PA pressure. Left Atrium: Left atrial size was normal in size. Right Atrium: Right atrial size was normal in size. Pericardium: There is no evidence of pericardial effusion. Mitral Valve: The mitral valve is normal in structure. Trivial mitral valve regurgitation. No evidence of mitral valve stenosis. MV peak gradient, 2.3 mmHg. The mean mitral valve gradient is 1.0 mmHg. Tricuspid Valve: The tricuspid valve is normal in structure. Tricuspid valve regurgitation is not demonstrated. No evidence of tricuspid stenosis. Aortic Valve: The aortic valve is tricuspid. Aortic valve regurgitation is not visualized. No aortic stenosis is present. Aortic valve mean gradient measures 3.0 mmHg. Aortic valve peak gradient measures 5.2 mmHg. Aortic valve area, by VTI measures 3.29 cm. Pulmonic Valve: The pulmonic valve was not well visualized. Pulmonic valve regurgitation is mild. No evidence of pulmonic stenosis. Aorta: The aortic root is normal in size and structure. Venous: The inferior vena cava is normal in size with greater than 50% respiratory variability, suggesting right atrial pressure of 3 mmHg. IAS/Shunts: No atrial level shunt detected by color flow Doppler.  LEFT VENTRICLE PLAX 2D LVIDd:         4.60 cm     Diastology LVIDs:         2.60 cm     LV e' medial:    5.35 cm/s LV PW:         1.10 cm     LV E/e' medial:  12.3 LV IVS:        1.10 cm     LV e' lateral:   11.80 cm/s LVOT diam:     2.30 cm     LV E/e' lateral: 5.6 LV SV:         77 LV SV Index:    36 LVOT Area:     4.15 cm  LV Volumes (MOD) LV vol d, MOD A2C: 96.8 ml LV vol d, MOD A4C: 62.1 ml LV vol s, MOD A2C: 43.7 ml LV vol s, MOD A4C: 26.9 ml LV SV MOD A2C:     53.1 ml LV SV MOD A4C:     62.1 ml LV SV MOD BP:      43.8 ml RIGHT VENTRICLE RV Basal diam:  3.40 cm RV Mid diam:    3.40 cm RV S prime:     14.80 cm/s TAPSE (M-mode): 2.7 cm LEFT ATRIUM             Index        RIGHT ATRIUM           Index LA diam:        3.50 cm 1.64 cm/m   RA Area:     22.10 cm LA Vol (A2C):   72.4 ml 33.96 ml/m  RA Volume:   68.70 ml  32.22 ml/m LA Vol (A4C):   53.5 ml 25.09 ml/m LA Biplane Vol: 64.9 ml 30.44 ml/m  AORTIC VALVE  PULMONIC VALVE AV Area (Vmax):    3.26 cm     PV Vmax:       0.73 m/s AV Area (Vmean):   3.02 cm     PV Peak grad:  2.1 mmHg AV Area (VTI):     3.29 cm AV Vmax:           114.00 cm/s AV Vmean:          79.400 cm/s AV VTI:            0.235 m AV Peak Grad:      5.2 mmHg AV Mean Grad:      3.0 mmHg LVOT Vmax:         89.40 cm/s LVOT Vmean:        57.700 cm/s LVOT VTI:          0.186 m LVOT/AV VTI ratio: 0.79  AORTA Ao Root diam: 3.70 cm Ao Asc diam:  3.30 cm MITRAL VALVE MV Area (PHT): 3.00 cm    SHUNTS MV Area VTI:   3.23 cm    Systemic VTI:  0.19 m MV Peak grad:  2.3 mmHg    Systemic Diam: 2.30 cm MV Mean grad:  1.0 mmHg MV Vmax:       0.75 m/s MV Vmean:      43.0 cm/s MV Decel Time: 253 msec MV E velocity: 65.60 cm/s MV A velocity: 61.70 cm/s MV E/A ratio:  1.06 [name] MD Electronically signed by [name] MD Signature Date/Time: 12/31/2020/11:23:06 AM    Final    [place] LIMITED JOINT SPACE STRUCTURES UP RIGHT(NO LINKED CHARGES)  Result Date: 12/26/2020 Procedure: Ultrasound Guided Glenohumeral Joint Injection Side: Right Diagnosis: Adhesive capsulitis [place] Indication: - accuracy is paramount for diagnosis - to ensure therapeutic efficacy or procedural success - to reduce procedural risk   After PARQ discussed and consent was given verbally. The site was  cleaned with chlorhexidine prep. An ultrasound transducer was placed on the posterior shoulder.  The posterior capsule, labrum, and infraspinatus were identified.  A steroid injection was performed under ultrasound guidance with sterile technique using  51ml of 1% lidocaine without epinephrine and 80 mg of triamcinolone (KENALOG) 40mg /ml. This was well tolerated and resulted in relief.  Needle was removed and dressing placed and post injection instructions were given including  a discussion of likely return of pain today after the anesthetic wears off (with the possibility of worsened pain) until the steroid starts to work in 1-3 days.   Pt was advised to call or return to clinic if these symptoms worsen or fail to improve as anticipated.     CT Angio Chest/Abd/Pel for Dissection W and/or W/WO  Result Date: 12/30/2020 CLINICAL DATA:  Chest and abdominal pain for several hours, initial encounter EXAM: CT ANGIOGRAPHY CHEST, ABDOMEN AND PELVIS TECHNIQUE: Non-contrast CT of the chest was initially obtained. Multidetector CT imaging through the chest, abdomen and pelvis was performed using the standard protocol during bolus administration of intravenous contrast. Multiplanar reconstructed images and MIPs were obtained and reviewed to evaluate the vascular anatomy. CONTRAST:  36mL OMNIPAQUE IOHEXOL 350 MG/ML SOLN COMPARISON:  CT from 10/24/2016, 05/29/2020 FINDINGS: CTA CHEST FINDINGS Cardiovascular: Precontrast images show no hyperdense crescent to suggest acute aortic injury. Post-contrast images show no aneurysmal dilatation or dissection. No cardiac enlargement is seen. No coronary calcifications are noted. Pulmonary artery as visualized is within normal limits although not timed for embolus evaluation. Mediastinum/Nodes: Thoracic inlet is within normal limits. No sizable hilar  or mediastinal adenopathy is noted. The esophagus is within normal limits. Lungs/Pleura: Small ovoid nodular density is noted along the  major fissure on the left best seen on image number [id number] consistent with small subpleural lymph node. This is stable from the prior exam and consistent with a benign etiology. No focal infiltrate or sizable effusion is seen. No parenchymal nodules are noted. Musculoskeletal: Degenerative changes of the thoracic spine are noted. No acute rib abnormality is seen. Review of the MIP images confirms the above findings. CTA ABDOMEN AND PELVIS FINDINGS VASCULAR Aorta: Abdominal aorta shows no aneurysmal dilatation or dissection. Minimal atherosclerotic calcifications are seen. Celiac: Variant anatomy is noted with the common hepatic artery arising directly from the aorta. Left gastric and splenic artery are within normal limits. SMA: Patent without evidence of aneurysm, dissection, vasculitis or significant stenosis. Renals: Both renal arteries are patent without evidence of aneurysm, dissection, vasculitis, fibromuscular dysplasia or significant stenosis. IMA: Patent without evidence of aneurysm, dissection, vasculitis or significant stenosis. Inflow: Iliacs show mild eccentric plaque in the proximal left common iliac artery although no stenosis is seen. No other focal abnormality is noted. Veins: No specific venous abnormality is noted. Review of the MIP images confirms the above findings. NON-VASCULAR Hepatobiliary: Fatty infiltration of the liver is noted. Gallbladder is within normal limits. Pancreas: Unremarkable. No pancreatic ductal dilatation or surrounding inflammatory changes. Spleen: Normal in size without focal abnormality. Adrenals/Urinary Tract: Adrenal glands are within normal limits. Kidneys demonstrate a normal enhancement pattern bilaterally. No renal calculi or obstructive changes are seen. The ureters are within normal limits. Bladder is partially distended. Stomach/Bowel: Appendix is within normal limits. No obstructive or inflammatory changes of the colon seen. Small bowel and stomach are within normal  limits. Lymphatic: No significant lymphadenopathy is noted. Reproductive: Prostate is unremarkable. Other: No abdominal wall hernia or abnormality. No abdominopelvic ascites. Musculoskeletal: No acute bony abnormality is noted. Degenerative changes of lumbar spine are seen. Review of the MIP images confirms the above findings. IMPRESSION: CTA of the chest: No evidence of aortic dissection or pulmonary embolism. Stable subpleural lymph nodes unchanged from 2018. No acute abnormality is noted. CTA of the abdomen and pelvis: Mild atherosclerotic changes are seen without acute dissection or aneurysmal dilatation. Fatty infiltration of the liver. No other focal abnormality is noted. Electronically Signed   By: Inez Catalina M.D.   On: 12/30/2020 20:19   US Abdomen Limited RUQ (LIVER/GB)  Result Date: 01/07/2021 CLINICAL DATA:  5 day history of epigastric pain. EXAM: ULTRASOUND ABDOMEN LIMITED RIGHT UPPER QUADRANT COMPARISON:  None. FINDINGS: Gallbladder: Gallbladder wall is thickened up to 5 mm. There is dependent nonshadowing sludge and probable tiny stones. No pericholecystic fluid. Sonographer reports no sonographic Murphy sign. Common bile duct: Diameter: 4-5 mm Liver: No focal lesion identified. Within normal limits in parenchymal echogenicity. Portal vein is patent on color Doppler imaging with normal direction of blood flow towards the liver. Other: None. IMPRESSION: Gallbladder wall thickening with dependent sludge and probable tiny stones. No pericholecystic fluid or sonographic Murphy sign. No biliary dilatation. Nuclear scintigraphy may prove helpful to further evaluate if cystic duct obstruction is a clinical concern. Electronically Signed   By: Misty Stanley M.D.   On: 01/07/2021 16:01    Micro Results  Recent Results (from the past 240 hour(s))  Culture, blood (Routine X 2) w Reflex to ID Panel     Status: None   Collection Time: 01/08/21 10:12 AM   Specimen: BLOOD RIGHT HAND  Result Value  Ref  Range Status   Specimen Description BLOOD RIGHT HAND  Final   Special Requests   Final    BOTTLES DRAWN AEROBIC AND ANAEROBIC Blood Culture adequate volume   Culture   Final    NO GROWTH 5 DAYS Performed at [facility], [address], [place], [place] [address]    Report Status 01/13/2021 FINAL  Final  Culture, blood (Routine X 2) w Reflex to ID Panel     Status: None   Collection Time: 01/08/21 10:13 AM   Specimen: BLOOD LEFT HAND  Result Value Ref Range Status   Specimen Description BLOOD LEFT HAND  Final   Special Requests   Final    BOTTLES DRAWN AEROBIC ONLY Blood Culture results may not be optimal due to an inadequate volume of blood received in culture bottles   Culture   Final    NO GROWTH 5 DAYS Performed at [facility], [address] [place], [place] [address]    Report Status 01/13/2021 FINAL  Final   Today   Subjective    [name] today has no new complaints  No fever  Or chills   No Nausea, Vomiting or Diarrhea         Patient has been seen and examined prior to discharge   Objective   Blood pressure (!) 146/92, pulse 79, temperature 98.7 F (37.1 C), temperature source Oral, resp. rate 18, height 6\' 1"  (1.854 m), weight 88.5 kg, SpO2 97 %.   Intake/Output Summary (Last 24 hours) at 01/13/2021 1121 Last data filed at 01/13/2021 0900 Gross per 24 hour  Intake 960 ml  Output 750 ml  Net 210 ml   Exam Gen:- Awake Alert, no acute distress  HEENT:- [place].AT, No sclera icterus Neck-Supple Neck,No JVD,.  Lungs-  CTAB , good air movement bilaterally  CV- S1, S2 normal, regular Abd-  +ve B.Sounds, Abd Soft, No  significant tenderness,    Extremity/Skin:- No  edema,   good pulses Psych-affect is appropriate, oriented x3 Neuro-no new focal deficits, no tremors    Data Review   CBC w Diff:  Lab Results  Component Value Date   WBC 11.1 (H) 01/13/2021   HGB 9.4 (L) 01/13/2021   HGB 13.6 10/04/2013   HCT 28.2 (L) 01/13/2021   HCT 39.4 (L)  10/04/2013   PLT 321 01/13/2021   PLT 234 10/04/2013   LYMPHOPCT 23 03/24/2020   LYMPHOPCT 13.2 10/04/2013   MONOPCT 6 03/24/2020   MONOPCT 6.7 10/04/2013   EOSPCT 3 03/24/2020   EOSPCT 1.8 10/04/2013   BASOPCT 1 03/24/2020   BASOPCT 0.4 10/04/2013    CMP:  Lab Results  Component Value Date   NA 136 01/13/2021   NA 142 08/21/2020   NA 138 10/04/2013   K 3.5 01/13/2021   K 3.0 (L) 10/04/2013   CL 106 01/13/2021   CL 104 10/04/2013   CO2 20 (L) 01/13/2021   CO2 25 10/04/2013   BUN 14 01/13/2021   BUN 12 08/21/2020   BUN 13 10/04/2013   CREATININE 0.91 01/13/2021   CREATININE 1.04 11/16/2019   PROT 6.6 01/13/2021   PROT 7.0 08/21/2020   PROT 7.4 10/04/2013   ALBUMIN 2.7 (L) 01/13/2021   ALBUMIN 4.7 08/21/2020   ALBUMIN 4.1 10/04/2013   BILITOT 0.9 01/13/2021   BILITOT 0.5 08/21/2020   BILITOT 0.4 10/04/2013   ALKPHOS 260 (H) 01/13/2021   ALKPHOS 114 10/04/2013   AST 21 01/13/2021   AST 8 (L) 10/04/2013  ALT 37 01/13/2021   ALT 19 10/04/2013  .   Total Discharge time is about 33 minutes  Roxan Hockey M.D on 01/13/2021 at 11:21 AM  Go to [email or web address] -  for contact info  [facility] - Office  [phone]

## 2021-01-14 ENCOUNTER — Ambulatory Visit (HOSPITAL_COMMUNITY): Payer: BC Managed Care – PPO | Attending: Sports Medicine

## 2021-01-17 ENCOUNTER — Telehealth (HOSPITAL_COMMUNITY): Payer: Self-pay

## 2021-01-17 ENCOUNTER — Other Ambulatory Visit (HOSPITAL_COMMUNITY): Payer: Self-pay

## 2021-01-17 NOTE — Telephone Encounter (Signed)
Transitions of Care Pharmacy  ° °Call attempted for a pharmacy transitions of care follow-up. HIPAA appropriate voicemail was left with call back information provided.  ° °Call attempt #1. Will follow-up in 2-3 days.  °  °

## 2021-01-22 ENCOUNTER — Encounter: Payer: Self-pay | Admitting: Nurse Practitioner

## 2021-01-22 ENCOUNTER — Telehealth (HOSPITAL_COMMUNITY): Payer: Self-pay

## 2021-01-22 ENCOUNTER — Ambulatory Visit: Payer: BC Managed Care – PPO | Admitting: Nurse Practitioner

## 2021-01-22 ENCOUNTER — Other Ambulatory Visit: Payer: Self-pay

## 2021-01-22 VITALS — BP 118/76 | HR 80 | Ht 73.0 in | Wt 172.0 lb

## 2021-01-22 DIAGNOSIS — I214 Non-ST elevation (NSTEMI) myocardial infarction: Secondary | ICD-10-CM | POA: Diagnosis not present

## 2021-01-22 DIAGNOSIS — I10 Essential (primary) hypertension: Secondary | ICD-10-CM | POA: Diagnosis not present

## 2021-01-22 DIAGNOSIS — I2510 Atherosclerotic heart disease of native coronary artery without angina pectoris: Secondary | ICD-10-CM

## 2021-01-22 DIAGNOSIS — E785 Hyperlipidemia, unspecified: Secondary | ICD-10-CM

## 2021-01-22 DIAGNOSIS — E1165 Type 2 diabetes mellitus with hyperglycemia: Secondary | ICD-10-CM

## 2021-01-22 NOTE — Progress Notes (Signed)
Office Visit    Patient Name: Jacob [facility] Date of Encounter: 01/22/2021  Primary Care Provider:  [name], MD Primary Cardiologist:  [name], MD  Chief Complaint    50 year old male with a history of CAD status post non-STEMI and diagonal/RCA stenting in early December 2022, hypertension, hyperlipidemia, diabetes, prior DVT, tobacco abuse, and GERD, who presents for follow-up after non-STEMI earlier this month.  Past Medical History    Past Medical History:  Diagnosis Date   (HFpEF) heart failure with preserved ejection fraction ([place])    a. 12/2020 Echo: EF 65-70%, no rwma, mild LVH, nl RV fxn, triv MR.   CAD (coronary artery disease)    a. 12/2020 NSTEMI/PCI: LM nl, LAD min irregs, D1 99 (2.0x12 Onyx Frontier DES), LCX 85p (small), RCA 80p/m (4.0x18 Onyx Frontier DES - staged following day), 60d, RPDA mod dzs. EF 50-55%.   Diabetes mellitus without complication ([place])    DVT (deep venous thrombosis) ([place]) 04/2017   GERD (gastroesophageal reflux disease)    History of migraine    none in over 2 yrs   Hyperlipidemia    Hypertension    Syncope and collapse    x1 - approx 2-3 yrs ago - dehydration   Past Surgical History:  Procedure Laterality Date   COLONOSCOPY     COLONOSCOPY WITH PROPOFOL N/A 08/06/2017   Procedure: COLONOSCOPY WITH PROPOFOL;  Surgeon: Lucilla Lame, MD;  Location: [facility];  Service: Endoscopy;  Laterality: N/A;  Diabetic - oral meds   CORONARY STENT INTERVENTION N/A 12/31/2020   Procedure: CORONARY STENT INTERVENTION;  Surgeon: Belva Crome, MD;  Location: [facility];  Service: Cardiovascular;  Laterality: N/A;   CORONARY STENT INTERVENTION N/A 01/01/2021   Procedure: CORONARY STENT INTERVENTION;  Surgeon: [name], MD;  Location: [facility];  Service: Cardiovascular;  Laterality: N/A;   ESOPHAGEAL DILATION  09/24/2016   Procedure: ESOPHAGEAL DILATION;  Surgeon: Lucilla Lame, MD;  Location: [facility];  Service: Gastroenterology;;   ESOPHAGOGASTRODUODENOSCOPY (EGD) WITH PROPOFOL N/A 04/08/2015   Procedure: ESOPHAGOGASTRODUODENOSCOPY (EGD) WITH PROPOFOL with dialation;  Surgeon: Lucilla Lame, MD;  Location: [facility];  Service: Endoscopy;  Laterality: N/A;  diabetic - oral meds   ESOPHAGOGASTRODUODENOSCOPY (EGD) WITH PROPOFOL N/A 09/24/2016   Procedure: ESOPHAGOGASTRODUODENOSCOPY (EGD) WITH PROPOFOL WITH DILATION;  Surgeon: Lucilla Lame, MD;  Location: [facility];  Service: Gastroenterology;  Laterality: N/A;  Diabetic - oral meds   FINGER AMPUTATION  2009   partial removal left index finger    LEFT HEART CATH AND CORONARY ANGIOGRAPHY N/A 12/31/2020   Procedure: LEFT HEART CATH AND CORONARY ANGIOGRAPHY;  Surgeon: Belva Crome, MD;  Location: [place] [facility];  Service: Cardiovascular;  Laterality: N/A;   LEFT HEART CATH AND CORONARY ANGIOGRAPHY N/A 01/01/2021   Procedure: LEFT HEART CATH AND CORONARY ANGIOGRAPHY;  Surgeon: [name], MD;  Location: [facility];  Service: Cardiovascular;  Laterality: N/A;   POLYPECTOMY  08/06/2017   Procedure: POLYPECTOMY INTESTINAL;  Surgeon: Lucilla Lame, MD;  Location: [facility];  Service: Endoscopy;;    Allergies  No Known Allergies  History of Present Illness    50 year old male with a history of CAD status post non-STEMI and diagonal/RCA stenting in early December 2022, hypertension, hyperlipidemia, diabetes, prior DVT, tobacco abuse, and GERD.  On December 30, 2020, he presented to [facility] with chest pain and ruled in for non-STEMI.  He was transferred  to [facility] and underwent diagnostic catheterization revealing severe first diagonal, proximal left circumflex, and proximal/mid RCA disease.  The first diagonal was felt to be the culprit and this was successfully treated with drug-eluting stent.  EF was normal by ventriculogram and echocardiogram.  He had recurrent chest pain on December 7 and  went back to the Cath Lab and underwent staged intervention of the mid right coronary artery.  He has residual 85% stenosis in the proximal left circumflex however, this is felt to be a small vessel.  He was discharged home on aspirin and Brilinta on December 8.  Unfortunately, he was seen back in the emergency department on December 9 with abdominal discomfort.  Troponin was downtrending.  CT of the abdomen was unremarkable and he was discharged from the ED.  He was seen again in the emergency department on December 12 with chest pain.  Troponin was 73 and downtrending from prior visit.  CT of the chest was negative for PE or dissection.  No acute findings on CT of the abdomen or pelvis.  Amlodipine was increased to 10 mg daily and he was placed on isosorbide mononitrate.  He presented back to the emergency department on December 13 with ongoing upper abdominal pain.  He was initially hypertensive.  Right upper quadrant ultrasound showed gallbladder wall thickening with dependent sludge and probable tiny stones.  HIDA scan showed acute cholecystitis.  He was placed on intravenous Zosyn and seen by general surgery who felt that if symptoms persist in the outpatient setting, he may need placement of a cholecystectomy tube by interventional radiology.  From a cardiac standpoint, patient has been feeling well without any chest pain or dyspnea.  He does feel like the Imdur helped.  He has had resolution of abdominal discomfort but just feels somewhat rundown as he felt like the antibiotic (Augmentin) caused nausea, diarrhea, and other GI side effects.  He denies palpitations, PND, orthopnea, dizziness, syncope, edema, or early satiety.  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  Home Medications    Current Outpatient Medications  Medication Sig Dispense Refill   acetaminophen (TYLENOL) 325 MG tablet Take 2 tablets (650 mg total) by mouth every 6  (six) hours as needed for mild pain or fever. 12 tablet 0   amLODipine (NORVASC) 10 MG tablet Take 1 tablet (10 mg total) by mouth daily. 30 tablet 1   aspirin 81 MG EC tablet Take 1 tablet (81 mg total) by mouth daily with breakfast. Swallow whole. 90 tablet 3   bisacodyl (DULCOLAX) 10 MG suppository Place 1 suppository (10 mg total) rectally as needed for moderate constipation. 12 suppository 0   carvedilol (COREG) 12.5 MG tablet Take 1 tablet (12.5 mg total) by mouth 2 (two) times daily with a meal. 60 tablet 3   cyanocobalamin 1000 MCG tablet Take 100 mcg by mouth daily.     dapagliflozin propanediol (FARXIGA) 10 MG TABS tablet Take 1 tablet (10 mg total) by mouth daily. 30 tablet 11   glipiZIDE (GLUCOTROL XL) 5 MG 24 hr tablet Take 2 tablets (10 mg total) by mouth daily with breakfast. 180 tablet 1   glucose blood (ONETOUCH VERIO) test strip Test sugars twice daily 50 each 6   isosorbide mononitrate (IMDUR) 30 MG 24 hr tablet Take 1 tablet (30 mg total) by mouth daily. For heart 30 tablet 5   lisinopril (ZESTRIL) 20 MG tablet Take 1 tablet (  20 mg total) by mouth daily. 30 tablet 11   metFORMIN (GLUCOPHAGE) 850 MG tablet TAKE 1 TABLET(850 MG) BY MOUTH TWICE DAILY WITH A MEAL. 180 tablet 3   nicotine (NICODERM CQ - DOSED IN MG/24 HOURS) 21 mg/24hr patch Place 1 patch (21 mg total) onto the skin daily. 30 patch 2   nitroGLYCERIN (NITROSTAT) 0.4 MG SL tablet Place 1 tablet (0.4 mg total) under the tongue every 5 (five) minutes as needed for chest pain. 25 tablet 1   ondansetron (ZOFRAN) 4 MG tablet Take 1 tablet (4 mg total) by mouth daily as needed for nausea or vomiting. 30 tablet 1   ONETOUCH DELICA LANCETS 62I MISC Test sugars twice daily 50 each 3   oxyCODONE-acetaminophen (PERCOCET) 7.5-325 MG tablet Take 1 tablet by mouth every 6 (six) hours as needed for moderate pain or severe pain. 12 tablet 0   pantoprazole (PROTONIX) 40 MG tablet Take 1 tablet (40 mg total) by mouth daily. 30 tablet 5    polyethylene glycol (MIRALAX) 17 g packet Take 17 g by mouth daily. 30 each 1   senna-docusate (SENOKOT-S) 8.6-50 MG tablet Take 2 tablets by mouth at bedtime. 60 tablet 0   ticagrelor (BRILINTA) 90 MG TABS tablet Take 1 tablet (90 mg total) by mouth 2 (two) times daily. 60 tablet 11   Plecanatide (TRULANCE) 3 MG TABS Take 3 mg by mouth daily. (Patient not taking: Reported on 01/22/2021) 30 tablet 5   rosuvastatin (CRESTOR) 40 MG tablet Take 1 tablet (40 mg total) by mouth daily. (Patient not taking: Reported on 01/22/2021) 90 tablet 3   No current facility-administered medications for this visit.     Review of Systems    Feeling rundown and somewhat fatigued but overall seems to be recovering well from recent bout of cholecystitis.  He denies chest pain, dyspnea, palpitations, PND, orthopnea, dizziness, syncope, edema, or early satiety.  All other systems reviewed and are otherwise negative except as noted above.   Cardiac Rehabilitation Eligibility Assessment      Physical Exam    VS:  BP 118/76    Pulse 80    Ht 6\' 1"  (1.854 m)    Wt 172 lb (78 kg)    SpO2 98%    BMI 22.69 kg/m  , BMI Body mass index is 22.69 kg/m.     GEN: Well nourished, well developed, in no acute distress. HEENT: normal. Neck: Supple, no JVD, carotid bruits, or masses. Cardiac: RRR, no murmurs, rubs, or gallops. No clubbing, cyanosis, edema.  Radials/PT 2+ and equal bilaterally.  Right radial catheterization site without bleeding, bruit, or hematoma. Respiratory:  Respirations regular and unlabored, clear to auscultation bilaterally. GI: Soft, nontender, nondistended, BS + x 4. MS: no deformity or atrophy. Skin: warm and dry, no rash. Neuro:  Strength and sensation are intact. Psych: Normal affect.  Accessory Clinical Findings    ECG personally reviewed by me today -most recent ECG performed January 24, 2021 showed sinus rhythm, 72, no acute ST or T changes.  Lab Results  Component Value Date   WBC  11.1 (H) 01/13/2021   HGB 9.4 (L) 01/13/2021   HCT 28.2 (L) 01/13/2021   MCV 82.7 01/13/2021   PLT 321 01/13/2021   Lab Results  Component Value Date   CREATININE 0.91 01/13/2021   BUN 14 01/13/2021   NA 136 01/13/2021   K 3.5 01/13/2021   CL 106 01/13/2021   CO2 20 (L) 01/13/2021   Lab Results  Component  Value Date   ALT 37 01/13/2021   AST 21 01/13/2021   ALKPHOS 260 (H) 01/13/2021   BILITOT 0.9 01/13/2021   Lab Results  Component Value Date   CHOL 248 (H) 01/01/2021   HDL 30 (L) 01/01/2021   LDLCALC 178 (H) 01/01/2021   LDLDIRECT 176.0 10/18/2018   TRIG 202 (H) 01/01/2021   CHOLHDL 8.3 01/01/2021    Lab Results  Component Value Date   HGBA1C 7.4 (H) 12/31/2020    Assessment & Plan    1.  Non-STEMI, subsequent episode of care: Patient admitted to [facility] in early December with chest pain and non-STEMI status post first diagonal and right coronary artery stenting.  He has residual 85% stenosis in a small left circumflex, which has been medically managed.  He did have an ER visit shortly after discharge for chest pain, though has had no recurrent chest pain since being placed on isosorbide therapy.  He remains on aspirin, Brilinta, beta-blocker, and ACE inhibitor therapy.  He has not been taking rosuvastatin recently as he thought it was upsetting his stomach, though ultimately, this was diagnosed as cholecystitis.  He will resume rosuvastatin and contact us if he has any recurrent GI upset (he previously was taking 10 mg without complications).  We discussed the role of cardiac rehabilitation and he will consider.  Further, we discussed DOT requirements for patients following non-STEMI with stenting.  He will not be able drive a truck for 2 months and will require exercise treadmill testing every 2 years going forward.  2.  Essential hypertension, stable on beta-blocker, ACE inhibitor, and nitrate therapy.  3.  Hyperlipidemia: LDL was 178 during admission.  His  rosuvastatin dose was increased to 40 mg daily.  He did note some GI upset with this and stopped taking it though as noted above, this was ultimately diagnosed as cholecystitis.  He is willing to retry rosuvastatin therapy and will contact us if he does not tolerate.  4.  Type 2 diabetes mellitus: A1c 7.4 in December.  He is currently on glipizide, metformin, and dapagliflozin.  He is also on statin and ACE inhibitor therapy.  5.  Tobacco abuse: Cessation advised.  6.  Disposition: Follow-up in clinic in 1 month or sooner if necessary.   Murray Hodgkins, NP 01/22/2021, 5:34 PM

## 2021-01-22 NOTE — Telephone Encounter (Signed)
Transitions of Care Pharmacy   Call attempted for a pharmacy transitions of care follow-up. Unable to leave voicemail.   Call attempt #2. Will follow-up in 2-3 days.    

## 2021-01-22 NOTE — Patient Instructions (Signed)
Medication Instructions:  Your physician recommends that you continue on your current medications as directed. Please refer to the Current Medication list given to you today.  *If you need a refill on your cardiac medications before your next appointment, please call your pharmacy*   Lab Work: None today  If you have labs (blood work) drawn today and your tests are completely normal, you will receive your results only by: [place] (if you have MyChart) OR A paper copy in the mail If you have any lab test that is abnormal or we need to change your treatment, we will call you to review the results.   Testing/Procedures: None today    Follow-Up: At [facility], you and your health needs are our priority.  As part of our continuing mission to provide you with exceptional heart care, we have created designated Provider Care Teams.  These Care Teams include your primary Cardiologist (physician) and Advanced Practice Providers (APPs -  Physician Assistants and Nurse Practitioners) who all work together to provide you with the care you need, when you need it.  We recommend signing up for the patient portal called "MyChart".  Sign up information is provided on this After Visit Summary.  MyChart is used to connect with patients for Virtual Visits (Telemedicine).  Patients are able to view lab/test results, encounter notes, upcoming appointments, etc.  Non-urgent messages can be sent to your provider as well.   To learn more about what you can do with MyChart, go to [email or web address].    Your next appointment:   1 month(s)  The format for your next appointment:   In Person  Provider:   Rozann Lesches, MD, Bernerd Pho, PA-C, or Ermalinda Barrios, PA-C    Other Instructions None

## 2021-01-23 ENCOUNTER — Encounter: Payer: Self-pay | Admitting: Internal Medicine

## 2021-01-23 ENCOUNTER — Telehealth (HOSPITAL_COMMUNITY): Payer: Self-pay

## 2021-01-23 NOTE — Telephone Encounter (Signed)
Transitions of Care Pharmacy   Call attempted for a pharmacy transitions of care follow-up. Unable to leave voicemail.  Call attempt #3. Will no longer attempt follow up for TOC pharmacy   

## 2021-01-24 ENCOUNTER — Telehealth (INDEPENDENT_AMBULATORY_CARE_PROVIDER_SITE_OTHER): Payer: BC Managed Care – PPO | Admitting: Internal Medicine

## 2021-01-24 ENCOUNTER — Encounter: Payer: Self-pay | Admitting: Internal Medicine

## 2021-01-24 ENCOUNTER — Telehealth (HOSPITAL_COMMUNITY): Payer: Self-pay | Admitting: Occupational Therapy

## 2021-01-24 VITALS — Ht 73.0 in | Wt 172.0 lb

## 2021-01-24 DIAGNOSIS — I252 Old myocardial infarction: Secondary | ICD-10-CM

## 2021-01-24 DIAGNOSIS — K810 Acute cholecystitis: Secondary | ICD-10-CM

## 2021-01-24 DIAGNOSIS — R634 Abnormal weight loss: Secondary | ICD-10-CM | POA: Diagnosis not present

## 2021-01-24 DIAGNOSIS — I10 Essential (primary) hypertension: Secondary | ICD-10-CM | POA: Diagnosis not present

## 2021-01-24 MED ORDER — MIRTAZAPINE 7.5 MG PO TABS: 7.5000 mg | ORAL_TABLET | Freq: Every day | ORAL | 1 refills | Status: DC

## 2021-01-24 NOTE — Progress Notes (Signed)
Pt stated that he is not taking the Crestor because he had been on it for two days when he had the flare up of abdominal pain and thought it was coming from that. He stated that he spoke to the cardiologist about it on Wednesday and he told pt that he should go back to taking but go back to taking the 5 mg dose.

## 2021-01-24 NOTE — Telephone Encounter (Signed)
Pt called back and was scheduled for an appt today

## 2021-01-24 NOTE — Telephone Encounter (Signed)
Attempted to call pt to discuss therapy and see if pt would like to formally discharge or if he plans to return to OT for shoulder pain. Left voicemail requesting return call.    Guadelupe Sabin, OTR/L  [phone] 01/24/21

## 2021-01-24 NOTE — Progress Notes (Signed)
Virtual Visit via [place] Note  This visit type was conducted due to national recommendations for restrictions regarding the COVID-19 pandemic (e.g. social distancing).  This format is felt to be most appropriate for this patient at this time.  All issues noted in this document were discussed and addressed.  No physical exam was performed (except for noted visual exam findings with Video Visits).   I connected withNAME@ on 01/24/21 at  2:30 PM EST by a video enabled telemedicine application and verified that I am speaking with the correct person using two identifiers. Location patient: home Location provider: work or home office Persons participating in the virtual visit: patient, provider  I discussed the limitations, risks, security and privacy concerns of performing an evaluation and management service by telephone and the availability of in person appointments. I also discussed with the patient that there may be a patient responsible charge related to this service. The patient expressed understanding and agreed to proceed.  Reason for visit: hosptial follow up  HPI:  50 yr old male with type 2 DM, hyperlipidemia, tobacco abuse hypertension and chronic abdominal pain presents for hospital follow up on 2 recent admission.  He ws admitted with NSTEMI on Dec 6  Underwent cardiac catheterization 12/6 with high-grade stenosis of large diagonal branch of 99% treated with PCI/DES x1.  Did have residual disease of 80 to 90% and circumflex.  Also 60% mid/distal disease of the PDA and LV branches. Placed on DAPT with aspirin/plavix post cath with recommendations for at least 1 year.  He continued to have recurrent chest pain post cath and went back to cath 12/7 with successful PCI of mRCAand underwent placement of DES in a 99% stenosed mid diagonal branch with restoration of TIMI 3 flow on Dec 7.  Discharged on Dec 8.  Returned to ED  on Dec 9 with persistent chest pain and periumbilical abdominal   pain and sent home with tx for constipation  after EK was nondiagnostic of acute ischemia, troponins were elevated but still trending down,  and CT abd/pelvis was done and interpreted as noncontributory (GB normal appearing)   Returned to ED on Dec 12 with continued reports of chest and abd pain.  CTA of chest abd and pelvis done to rule out PE, dissection.  Troponins  were still elevated but trending down.  Cardiology consulted;  angina suspected and Imdur added .  He was noted to be quite hypertensive,  but he  deferred advice to increase amlodipine and lisinopril since the doses had been reduced during previous hospitalization    He returned to the ED on Dec 13 with RUQ pain and was finally admitted with acute cholecystitis suggested  by  elevated alk phos and an ultrasound showing sludge and a thickened GB wall .  Acute cholecystitis was confirmed with  HIDA scan  (CT scan was again nonrevealing).  He was treated with IV abx until pain resolved uand discharged home on Augmentin. Surgery  was considered by deferred due to  the very recent DES placements and need to continue Brilinta uninterrupted for 6 months'  DM:   last a1c was 7.4  his meds were suspended durig hospitalization:  advised to suspend metformin glipIzide and Farxiga for 72 hours;  he has resumed all as directed .  \  His appetite is poor, not due to fear of eating and fear of post prandial pain: he states that he simply has a diminished appetite and an altered sense of smell  and  taste since his admission .  He has lost 25 lbs over the last month  and close to 40 lbs since February 2022 when he began having constant abdominal pain.   He has been out of work since Dec 5   ROS: See pertinent positives and negatives per HPI.  Past Medical History:  Diagnosis Date   (HFpEF) heart failure with preserved ejection fraction ([place])    a. 12/2020 Echo: EF 65-70%, no rwma, mild LVH, nl RV fxn, triv MR.   CAD (coronary artery disease)     a. 12/2020 NSTEMI/PCI: LM nl, LAD min irregs, D1 99 (2.0x12 Onyx Frontier DES), LCX 85p (small), RCA 80p/m (4.0x18 Onyx Frontier DES - staged following day), 60d, RPDA mod dzs. EF 50-55%.   Diabetes mellitus without complication ([place])    DVT (deep venous thrombosis) ([place]) 04/2017   GERD (gastroesophageal reflux disease)    History of migraine    none in over 2 yrs   Hyperlipidemia    Hypertension    Syncope and collapse    x1 - approx 2-3 yrs ago - dehydration    Past Surgical History:  Procedure Laterality Date   COLONOSCOPY     COLONOSCOPY WITH PROPOFOL N/A 08/06/2017   Procedure: COLONOSCOPY WITH PROPOFOL;  Surgeon: Lucilla Lame, MD;  Location: [facility];  Service: Endoscopy;  Laterality: N/A;  Diabetic - oral meds   CORONARY STENT INTERVENTION N/A 12/31/2020   Procedure: CORONARY STENT INTERVENTION;  Surgeon: Belva Crome, MD;  Location: [facility];  Service: Cardiovascular;  Laterality: N/A;   CORONARY STENT INTERVENTION N/A 01/01/2021   Procedure: CORONARY STENT INTERVENTION;  Surgeon: [name], MD;  Location: [facility];  Service: Cardiovascular;  Laterality: N/A;   ESOPHAGEAL DILATION  09/24/2016   Procedure: ESOPHAGEAL DILATION;  Surgeon: Lucilla Lame, MD;  Location: [facility];  Service: Gastroenterology;;   ESOPHAGOGASTRODUODENOSCOPY (EGD) WITH PROPOFOL N/A 04/08/2015   Procedure: ESOPHAGOGASTRODUODENOSCOPY (EGD) WITH PROPOFOL with dialation;  Surgeon: Lucilla Lame, MD;  Location: [facility];  Service: Endoscopy;  Laterality: N/A;  diabetic - oral meds   ESOPHAGOGASTRODUODENOSCOPY (EGD) WITH PROPOFOL N/A 09/24/2016   Procedure: ESOPHAGOGASTRODUODENOSCOPY (EGD) WITH PROPOFOL WITH DILATION;  Surgeon: Lucilla Lame, MD;  Location: [facility];  Service: Gastroenterology;  Laterality: N/A;  Diabetic - oral meds   FINGER AMPUTATION  2009   partial removal left index finger    LEFT HEART CATH AND CORONARY ANGIOGRAPHY N/A  12/31/2020   Procedure: LEFT HEART CATH AND CORONARY ANGIOGRAPHY;  Surgeon: Belva Crome, MD;  Location: [facility];  Service: Cardiovascular;  Laterality: N/A;   LEFT HEART CATH AND CORONARY ANGIOGRAPHY N/A 01/01/2021   Procedure: LEFT HEART CATH AND CORONARY ANGIOGRAPHY;  Surgeon: [name], MD;  Location: [facility];  Service: Cardiovascular;  Laterality: N/A;   POLYPECTOMY  08/06/2017   Procedure: POLYPECTOMY INTESTINAL;  Surgeon: Lucilla Lame, MD;  Location: [facility];  Service: Endoscopy;;    Family History  Problem Relation Age of Onset   Heart disease Father        CAD   Hypertension Father    Diabetes Father    Hypertension Sister    Hypertension Brother    Hypertension Brother    Hypertension Mother     SOCIAL HX:  reports that he quit smoking about 3 weeks ago. His smoking use included cigarettes. He has a 54.00 pack-year smoking history. He has never used smokeless tobacco. He  reports that he does not drink alcohol and does not use drugs.    Current Outpatient Medications:    acetaminophen (TYLENOL) 325 MG tablet, Take 2 tablets (650 mg total) by mouth every 6 (six) hours as needed for mild pain or fever., Disp: 12 tablet, Rfl: 0   amLODipine (NORVASC) 10 MG tablet, Take 1 tablet (10 mg total) by mouth daily., Disp: 30 tablet, Rfl: 1   aspirin 81 MG EC tablet, Take 1 tablet (81 mg total) by mouth daily with breakfast. Swallow whole., Disp: 90 tablet, Rfl: 3   carvedilol (COREG) 12.5 MG tablet, Take 1 tablet (12.5 mg total) by mouth 2 (two) times daily with a meal., Disp: 60 tablet, Rfl: 3   dapagliflozin propanediol (FARXIGA) 10 MG TABS tablet, Take 1 tablet (10 mg total) by mouth daily., Disp: 30 tablet, Rfl: 11   glipiZIDE (GLUCOTROL XL) 5 MG 24 hr tablet, Take 2 tablets (10 mg total) by mouth daily with breakfast., Disp: 180 tablet, Rfl: 1   glucose blood (ONETOUCH VERIO) test strip, Test sugars twice daily, Disp: 50 each, Rfl: 6    isosorbide mononitrate (IMDUR) 30 MG 24 hr tablet, Take 1 tablet (30 mg total) by mouth daily. For heart, Disp: 30 tablet, Rfl: 5   lisinopril (ZESTRIL) 20 MG tablet, Take 1 tablet (20 mg total) by mouth daily., Disp: 30 tablet, Rfl: 11   metFORMIN (GLUCOPHAGE) 850 MG tablet, TAKE 1 TABLET(850 MG) BY MOUTH TWICE DAILY WITH A MEAL., Disp: 180 tablet, Rfl: 3   mirtazapine (REMERON) 7.5 MG tablet, Take 1 tablet (7.5 mg total) by mouth at bedtime., Disp: 90 tablet, Rfl: 1   nitroGLYCERIN (NITROSTAT) 0.4 MG SL tablet, Place 1 tablet (0.4 mg total) under the tongue every 5 (five) minutes as needed for chest pain., Disp: 25 tablet, Rfl: 1   ondansetron (ZOFRAN) 4 MG tablet, Take 1 tablet (4 mg total) by mouth daily as needed for nausea or vomiting., Disp: 30 tablet, Rfl: 1   ONETOUCH DELICA LANCETS 57W MISC, Test sugars twice daily, Disp: 50 each, Rfl: 3   oxyCODONE-acetaminophen (PERCOCET) 7.5-325 MG tablet, Take 1 tablet by mouth every 6 (six) hours as needed for moderate pain or severe pain., Disp: 12 tablet, Rfl: 0   pantoprazole (PROTONIX) 40 MG tablet, Take 1 tablet (40 mg total) by mouth daily., Disp: 30 tablet, Rfl: 5   polyethylene glycol (MIRALAX) 17 g packet, Take 17 g by mouth daily., Disp: 30 each, Rfl: 1   ticagrelor (BRILINTA) 90 MG TABS tablet, Take 1 tablet (90 mg total) by mouth 2 (two) times daily., Disp: 60 tablet, Rfl: 11   bisacodyl (DULCOLAX) 10 MG suppository, Place 1 suppository (10 mg total) rectally as needed for moderate constipation. (Patient not taking: Reported on 01/24/2021), Disp: 12 suppository, Rfl: 0   cyanocobalamin 1000 MCG tablet, Take 100 mcg by mouth daily. (Patient not taking: Reported on 01/24/2021), Disp: , Rfl:    rosuvastatin (CRESTOR) 40 MG tablet, Take 1 tablet (40 mg total) by mouth daily., Disp: 90 tablet, Rfl: 3  EXAM:  VITALS per patient if applicable:  GENERAL: alert, oriented, appears well and in no acute distress  HEENT: atraumatic, conjunttiva  clear, no obvious abnormalities on inspection of external nose and ears  NECK: normal movements of the head and neck  LUNGS: on inspection no signs of respiratory distress, breathing rate appears normal, no obvious gross SOB, gasping or wheezing  CV: no obvious cyanosis  MS: moves all visible extremities without noticeable  abnormality  PSYCH/NEURO: pleasant and cooperative, AFFECT IS DEPRESSED ,  speech and thought processing grossly intact  ASSESSMENT AND PLAN:  Discussed the following assessment and plan:  Acute cholecystitis - Plan: Comprehensive metabolic panel, CBC with Differential/Platelet  History of non-ST elevation myocardial infarction (NSTEMI)  Essential hypertension  Unintentional weight loss of more than 10% body weight within 6 months  Acute cholecystitis TREATED with empiric IV antibiotics with plans to defer cholecystectomy for 3 months given placement of 2 DES on Dec 7/8 .  He currently denies post prandial pain,  But his alk phos was still rising at discharge on Dec 13 and needs rechecking.  He may need IR consult for tube placement to prevent worsening of condition.   History of non-ST elevation myocardial infarction (NSTEMI) He continues to have anginal symptoms controlled with addition of Imdur. He will need to continue Brilinta unopposed for 6 months . He has been advised to resume rosuvastatin, continue lisinopril and carvedilol and asa.   Essential hypertension Continue lisinopril, carvedilol, amlodipine and Imdur   Unintentional weight loss of more than 10% body weight within 6 months Adding mirtazipine for appetite stimulation, depression, and  and advising patient to add low fat high protein shakes to supplement diet.     I discussed the assessment and treatment plan with the patient. The patient was provided an opportunity to ask questions and all were answered. The patient agreed with the plan and demonstrated an understanding of the instructions.    The patient was advised to call back or seek an in-person evaluation if the symptoms worsen or if the condition fails to improve as anticipated.   I spent 40 minutes dedicated to the care of this patient on the date of this encounter to include pre-visit review of his medical history,  Face-to-face time with the patient , and post visit ordering of testing and therapeutics.    [name], MD

## 2021-01-24 NOTE — Telephone Encounter (Signed)
I attempted to call Pt several times. Pt phone goes straight to voicemail. [employer] office.

## 2021-01-24 NOTE — Patient Instructions (Addendum)
FAX ME THE FMLA FORM [phone]  Resume rosuvastatin ASAP.  I have sent this to [facility]

## 2021-01-25 DIAGNOSIS — R634 Abnormal weight loss: Secondary | ICD-10-CM | POA: Insufficient documentation

## 2021-01-25 MED ORDER — ROSUVASTATIN CALCIUM 40 MG PO TABS: 40.0000 mg | ORAL_TABLET | Freq: Every day | ORAL | 3 refills | Status: DC

## 2021-01-25 NOTE — Assessment & Plan Note (Addendum)
He continues to have anginal symptoms controlled with addition of Imdur. He will need to continue Brilinta unopposed for 6 months . He has been advised to resume rosuvastatin, continue lisinopril and carvedilol and asa.

## 2021-01-25 NOTE — Assessment & Plan Note (Addendum)
Adding mirtazipine for appetite stimulation, depression, and  and advising patient to add low fat high protein shakes to supplement diet.

## 2021-01-25 NOTE — Assessment & Plan Note (Signed)
Continue lisinopril, carvedilol, amlodipine and Imdur

## 2021-01-25 NOTE — Assessment & Plan Note (Signed)
TREATED with empiric IV antibiotics with plans to defer cholecystectomy for 3 months given placement of 2 DES on Dec 7/8 .  He currently denies post prandial pain,  But his alk phos was still rising at discharge on Dec 13 and needs rechecking.  He may need IR consult for tube placement to prevent worsening of condition.

## 2021-01-27 ENCOUNTER — Encounter: Payer: Self-pay | Admitting: Internal Medicine

## 2021-01-30 ENCOUNTER — Other Ambulatory Visit: Payer: Self-pay

## 2021-01-30 ENCOUNTER — Encounter: Payer: Self-pay | Admitting: "Endocrinology

## 2021-01-30 ENCOUNTER — Ambulatory Visit (INDEPENDENT_AMBULATORY_CARE_PROVIDER_SITE_OTHER): Payer: BC Managed Care – PPO | Admitting: "Endocrinology

## 2021-01-30 VITALS — BP 108/72 | HR 80 | Ht 73.0 in | Wt 172.0 lb

## 2021-01-30 DIAGNOSIS — E1159 Type 2 diabetes mellitus with other circulatory complications: Secondary | ICD-10-CM

## 2021-01-30 DIAGNOSIS — E782 Mixed hyperlipidemia: Secondary | ICD-10-CM

## 2021-01-30 MED ORDER — ROSUVASTATIN CALCIUM 10 MG PO TABS: 10.0000 mg | ORAL_TABLET | Freq: Every day | ORAL | 1 refills | Status: DC

## 2021-01-30 NOTE — Progress Notes (Signed)
01/30/2021, 8:55 PM   Endocrinology follow-up note  Subjective:    Patient ID: Jacob Baker, male    DOB: [birth year].  Jacob Baker is being seen in follow-up after he was seen in consultation for management of currently uncontrolled symptomatic diabetes requested by  [name], MD.   Past Medical History:  Diagnosis Date   (HFpEF) heart failure with preserved ejection fraction ([place])    a. 12/2020 Echo: EF 65-70%, no rwma, mild LVH, nl RV fxn, triv MR.   CAD (coronary artery disease)    a. 12/2020 NSTEMI/PCI: LM nl, LAD min irregs, D1 99 (2.0x12 Onyx Frontier DES), LCX 85p (small), RCA 80p/m (4.0x18 Onyx Frontier DES - staged following day), 60d, RPDA mod dzs. EF 50-55%.   Diabetes mellitus without complication ([place])    DVT (deep venous thrombosis) ([place]) 04/2017   GERD (gastroesophageal reflux disease)    History of migraine    none in over 2 yrs   Hyperlipidemia    Hypertension    Syncope and collapse    x1 - approx 2-3 yrs ago - dehydration    Past Surgical History:  Procedure Laterality Date   COLONOSCOPY     COLONOSCOPY WITH PROPOFOL N/A 08/06/2017   Procedure: COLONOSCOPY WITH PROPOFOL;  Surgeon: Lucilla Lame, MD;  Location: [place];  Service: Endoscopy;  Laterality: N/A;  Diabetic - oral meds   CORONARY STENT INTERVENTION N/A 12/31/2020   Procedure: CORONARY STENT INTERVENTION;  Surgeon: Belva Crome, MD;  Location: [place] [facility];  Service: Cardiovascular;  Laterality: N/A;   CORONARY STENT INTERVENTION N/A 01/01/2021   Procedure: CORONARY STENT INTERVENTION;  Surgeon: [name], MD;  Location: [place] [facility];  Service: Cardiovascular;  Laterality: N/A;   ESOPHAGEAL DILATION  09/24/2016   Procedure: ESOPHAGEAL DILATION;  Surgeon: Lucilla Lame, MD;  Location: [place];  Service: Gastroenterology;;   ESOPHAGOGASTRODUODENOSCOPY (EGD) WITH PROPOFOL  N/A 04/08/2015   Procedure: ESOPHAGOGASTRODUODENOSCOPY (EGD) WITH PROPOFOL with dialation;  Surgeon: Lucilla Lame, MD;  Location: [place];  Service: Endoscopy;  Laterality: N/A;  diabetic - oral meds   ESOPHAGOGASTRODUODENOSCOPY (EGD) WITH PROPOFOL N/A 09/24/2016   Procedure: ESOPHAGOGASTRODUODENOSCOPY (EGD) WITH PROPOFOL WITH DILATION;  Surgeon: Lucilla Lame, MD;  Location: [place];  Service: Gastroenterology;  Laterality: N/A;  Diabetic - oral meds   FINGER AMPUTATION  2009   partial removal left index finger    LEFT HEART CATH AND CORONARY ANGIOGRAPHY N/A 12/31/2020   Procedure: LEFT HEART CATH AND CORONARY ANGIOGRAPHY;  Surgeon: Belva Crome, MD;  Location: [place] [facility];  Service: Cardiovascular;  Laterality: N/A;   LEFT HEART CATH AND CORONARY ANGIOGRAPHY N/A 01/01/2021   Procedure: LEFT HEART CATH AND CORONARY ANGIOGRAPHY;  Surgeon: [name], MD;  Location: [place] [facility];  Service: Cardiovascular;  Laterality: N/A;   POLYPECTOMY  08/06/2017   Procedure: POLYPECTOMY INTESTINAL;  Surgeon: Lucilla Lame, MD;  Location: [facility];  Service: Endoscopy;;    Social History   Socioeconomic History   Marital status: Married    Spouse name: Not on file   Number of children: Not  on file   Years of education: Not on file   Highest education level: Not on file  Occupational History   Not on file  Tobacco Use   Smoking status: Former    Packs/day: 2.00    Years: 27.00    Pack years: 54.00    Types: Cigarettes    Quit date: 12/30/2020    Years since quitting: 0.0   Smokeless tobacco: Never  Vaping Use   Vaping Use: Never used  Substance and Sexual Activity   Alcohol use: No   Drug use: No   Sexual activity: Not on file  Other Topics Concern   Not on file  Social History Narrative   Not on file   Social Determinants of Health   Financial Resource Strain: Not on file  Food Insecurity: Not on file  Transportation Needs: Not on  file  Physical Activity: Not on file  Stress: Not on file  Social Connections: Not on file    Family History  Problem Relation Age of Onset   Heart disease Father        CAD   Hypertension Father    Diabetes Father    Hypertension Sister    Hypertension Brother    Hypertension Brother    Hypertension Mother     Outpatient Encounter Medications as of 01/30/2021  Medication Sig   METOPROLOL SUCCINATE PO Take by mouth 2 (two) times daily.   acetaminophen (TYLENOL) 325 MG tablet Take 2 tablets (650 mg total) by mouth every 6 (six) hours as needed for mild pain or fever.   amLODipine (NORVASC) 10 MG tablet Take 1 tablet (10 mg total) by mouth daily.   aspirin 81 MG EC tablet Take 1 tablet (81 mg total) by mouth daily with breakfast. Swallow whole.   carvedilol (COREG) 12.5 MG tablet Take 1 tablet (12.5 mg total) by mouth 2 (two) times daily with a meal.   dapagliflozin propanediol (FARXIGA) 10 MG TABS tablet Take 1 tablet (10 mg total) by mouth daily.   glipiZIDE (GLUCOTROL XL) 5 MG 24 hr tablet Take 2 tablets (10 mg total) by mouth daily with breakfast. (Patient taking differently: Take 5 mg by mouth daily with breakfast.)   glucose blood (ONETOUCH VERIO) test strip Test sugars twice daily   isosorbide mononitrate (IMDUR) 30 MG 24 hr tablet Take 1 tablet (30 mg total) by mouth daily. For heart   lisinopril (ZESTRIL) 20 MG tablet Take 1 tablet (20 mg total) by mouth daily.   metFORMIN (GLUCOPHAGE) 850 MG tablet TAKE 1 TABLET(850 MG) BY MOUTH TWICE DAILY WITH A MEAL.   mirtazapine (REMERON) 7.5 MG tablet Take 1 tablet (7.5 mg total) by mouth at bedtime. (Patient not taking: Reported on 01/30/2021)   nitroGLYCERIN (NITROSTAT) 0.4 MG SL tablet Place 1 tablet (0.4 mg total) under the tongue every 5 (five) minutes as needed for chest pain.   ondansetron (ZOFRAN) 4 MG tablet Take 1 tablet (4 mg total) by mouth daily as needed for nausea or vomiting.   ONETOUCH DELICA LANCETS 30S MISC Test sugars  twice daily   oxyCODONE-acetaminophen (PERCOCET) 7.5-325 MG tablet Take 1 tablet by mouth every 6 (six) hours as needed for moderate pain or severe pain.   pantoprazole (PROTONIX) 40 MG tablet Take 1 tablet (40 mg total) by mouth daily.   polyethylene glycol (MIRALAX) 17 g packet Take 17 g by mouth daily.   rosuvastatin (CRESTOR) 10 MG tablet Take 1 tablet (10 mg total) by mouth daily.  ticagrelor (BRILINTA) 90 MG TABS tablet Take 1 tablet (90 mg total) by mouth 2 (two) times daily.   [DISCONTINUED] bisacodyl (DULCOLAX) 10 MG suppository Place 1 suppository (10 mg total) rectally as needed for moderate constipation. (Patient not taking: Reported on 01/24/2021)   [DISCONTINUED] cyanocobalamin 1000 MCG tablet Take 100 mcg by mouth daily. (Patient not taking: Reported on 01/24/2021)   [DISCONTINUED] rosuvastatin (CRESTOR) 40 MG tablet Take 1 tablet (40 mg total) by mouth daily. (Patient not taking: Reported on 01/30/2021)   No facility-administered encounter medications on file as of 01/30/2021.    ALLERGIES: No Known Allergies  VACCINATION STATUS: Immunization History  Administered Date(s) Administered   Influenza Split 12/15/2013   Influenza,inj,Quad PF,6+ Mos 10/26/2014, 10/14/2015, 10/13/2017, 10/18/2018, 10/20/2019, 10/21/2020   Influenza-Unspecified 09/26/2016   Moderna Sars-Covid-2 Vaccination 04/28/2019, 05/30/2019   Pneumococcal Polysaccharide-23 10/14/2015   Tdap 10/26/2014    Diabetes He presents for his follow-up diabetic visit. He has type 2 diabetes mellitus. His disease course has been worsening. There are no hypoglycemic associated symptoms. Pertinent negatives for hypoglycemia include no confusion, headaches, pallor or seizures. Associated symptoms include polydipsia and polyuria. Pertinent negatives for diabetes include no chest pain, no fatigue, no polyphagia and no weakness. Symptoms are worsening. Diabetic complications include heart disease. (In the interim , he developed  chest pain, hospitalized and diagnosed with ST Elevation MI. He is s/p stent placement.) Risk factors for coronary artery disease include dyslipidemia, diabetes mellitus, male sex, family history and tobacco exposure. Current diabetic treatment includes oral agent (triple therapy). His weight is fluctuating minimally. He is following a generally unhealthy diet. When asked about meal planning, he reported none. He has not had a previous visit with a dietitian. He rarely participates in exercise. His home blood glucose trend is increasing steadily. (He presents without any meter nor logs, his a1c is 7.4% increasing from 6.6%. He denies hypoglycemia.    )  Hyperlipidemia This is a chronic problem. The current episode started more than 1 year ago. The problem is uncontrolled. Exacerbating diseases include diabetes. Pertinent negatives include no chest pain, myalgias or shortness of breath. Current antihyperlipidemic treatment includes statins. Risk factors for coronary artery disease include diabetes mellitus, dyslipidemia, male sex and family history.  Hypertension This is a chronic problem. The current episode started more than 1 year ago. The problem is controlled. Pertinent negatives include no chest pain, headaches, neck pain, palpitations or shortness of breath. Risk factors for coronary artery disease include family history, dyslipidemia, diabetes mellitus, male gender and smoking/tobacco exposure. Past treatments include calcium channel blockers. Hypertensive end-organ damage includes CAD/MI.    Review of Systems  Constitutional:  Negative for chills, fatigue, fever and unexpected weight change.  HENT:  Negative for dental problem, mouth sores and trouble swallowing.   Eyes:  Negative for visual disturbance.  Respiratory:  Negative for cough, choking, chest tightness, shortness of breath and wheezing.   Cardiovascular:  Negative for chest pain, palpitations and leg swelling.  Gastrointestinal:   Negative for abdominal distention, abdominal pain, constipation, diarrhea, nausea and vomiting.  Endocrine: Positive for polydipsia and polyuria. Negative for polyphagia.  Genitourinary:  Negative for dysuria, flank pain, hematuria and urgency.  Musculoskeletal:  Negative for back pain, gait problem, myalgias and neck pain.  Skin:  Negative for pallor, rash and wound.  Neurological:  Negative for seizures, syncope, weakness, numbness and headaches.  Psychiatric/Behavioral:  Negative for confusion and dysphoric mood.    Objective:    BP 108/72    Pulse  80    Ht 6\' 1"  (1.854 m)    Wt 172 lb (78 kg)    BMI 22.69 kg/m   Wt Readings from Last 3 Encounters:  01/30/21 172 lb (78 kg)  01/24/21 172 lb (78 kg)  01/22/21 172 lb (78 kg)     Physical Exam Constitutional:      General: He is not in acute distress.    Appearance: He is well-developed.  HENT:     Head: Normocephalic and atraumatic.  Neck:     Thyroid: No thyromegaly.     Trachea: No tracheal deviation.  Cardiovascular:     Rate and Rhythm: Normal rate.     Pulses:          Dorsalis pedis pulses are 1+ on the right side and 1+ on the left side.       Posterior tibial pulses are 1+ on the right side and 1+ on the left side.     Heart sounds: S1 normal and S2 normal. No murmur heard.   No gallop.  Pulmonary:     Effort: Pulmonary effort is normal. No respiratory distress.     Breath sounds: No wheezing.  Abdominal:     General: There is no distension.     Tenderness: There is no abdominal tenderness. There is no guarding.  Musculoskeletal:     Right shoulder: No swelling or deformity.     Cervical back: Normal range of motion and neck supple.  Skin:    General: Skin is warm and dry.     Findings: No rash.     Nails: There is no clubbing.  Neurological:     Mental Status: He is alert and oriented to person, place, and time.     Cranial Nerves: No cranial nerve deficit.     Sensory: No sensory deficit.     Gait: Gait  normal.     Deep Tendon Reflexes: Reflexes are normal and symmetric.  Psychiatric:        Speech: Speech normal.        Behavior: Behavior normal. Behavior is cooperative.        Thought Content: Thought content normal.        Judgment: Judgment normal.      CMP ( most recent) CMP     Component Value Date/Time   NA 136 01/13/2021 0348   NA 142 08/21/2020 0824   NA 138 10/04/2013 2025   K 3.5 01/13/2021 0348   K 3.0 (L) 10/04/2013 2025   CL 106 01/13/2021 0348   CL 104 10/04/2013 2025   CO2 20 (L) 01/13/2021 0348   CO2 25 10/04/2013 2025   GLUCOSE 200 (H) 01/13/2021 0348   GLUCOSE 175 (H) 10/04/2013 2025   BUN 14 01/13/2021 0348   BUN 12 08/21/2020 0824   BUN 13 10/04/2013 2025   CREATININE 0.91 01/13/2021 0348   CREATININE 1.04 11/16/2019 1505   CALCIUM 8.3 (L) 01/13/2021 0348   CALCIUM 9.2 10/04/2013 2025   PROT 6.6 01/13/2021 0348   PROT 7.0 08/21/2020 0824   PROT 7.4 10/04/2013 2025   ALBUMIN 2.7 (L) 01/13/2021 0348   ALBUMIN 4.7 08/21/2020 0824   ALBUMIN 4.1 10/04/2013 2025   AST 21 01/13/2021 0348   AST 8 (L) 10/04/2013 2025   ALT 37 01/13/2021 0348   ALT 19 10/04/2013 2025   ALKPHOS 260 (H) 01/13/2021 0348   ALKPHOS 114 10/04/2013 2025   BILITOT 0.9 01/13/2021 0348   BILITOT 0.5 08/21/2020 7062  BILITOT 0.4 10/04/2013 2025   GFRNONAA >60 01/13/2021 0348   GFRNONAA 83 11/16/2019 1505   GFRAA 96 11/16/2019 1505     Diabetic Labs (most recent): Lab Results  Component Value Date   HGBA1C 7.4 (H) 12/31/2020   HGBA1C 6.6 (H) 08/21/2020   HGBA1C 8.0 (A) 05/22/2020     Lipid Panel ( most recent) Lipid Panel     Component Value Date/Time   CHOL 248 (H) 01/01/2021 0406   CHOL 192 08/21/2020 0824   CHOL 180 03/23/2013 0422   TRIG 202 (H) 01/01/2021 0406   TRIG 218 (H) 03/23/2013 0422   HDL 30 (L) 01/01/2021 0406   HDL 28 (L) 08/21/2020 0824   HDL 21 (L) 03/23/2013 0422   CHOLHDL 8.3 01/01/2021 0406   VLDL 40 01/01/2021 0406   VLDL 44 (H)  03/23/2013 0422   LDLCALC 178 (H) 01/01/2021 0406   LDLCALC 133 (H) 08/21/2020 0824   LDLCALC 127 (H) 11/16/2019 1505   LDLCALC 115 (H) 03/23/2013 0422   LDLDIRECT 176.0 10/18/2018 1406   LABVLDL 31 08/21/2020 0824      Lab Results  Component Value Date   TSH 1.32 11/16/2019   TSH 1.38 10/18/2018   TSH 1.07 06/11/2016   TSH 1.66 03/12/2014   TSH 1.140 11/24/2013   TSH 1.42 02/25/2012   FREET4 1.2 11/16/2019   FREET4 1.00 04/23/2014      Assessment & Plan:   1. Uncontrolled type 2 diabetes mellitus with CAD - MYLZ YUAN has currently uncontrolled symptomatic type 2 DM since  51 years of age.  He presents without any meter nor logs, his a1c is 7.4% increasing from 6.6%. He denies hypoglycemia.      Recent labs reviewed, showing normal renal function. - I had a long discussion with him about the progressive nature of diabetes and the pathology behind its complications. -his diabetes is complicated by CAD s/p stent placement, smoking and he remains at a high risk for more acute and chronic complications which include CVA, CKD, retinopathy, and neuropathy. These are all discussed in detail with him.  - I have counseled him on diet  and weight management  by adopting a carbohydrate restricted/protein rich diet. Patient is encouraged to switch to  unprocessed or minimally processed     complex starch and increased protein intake (animal or plant source), fruits, and vegetables. -  he is advised to stick to a routine mealtimes to eat 3 meals  a day and avoid unnecessary snacks ( to snack only to correct hypoglycemia).    - he acknowledges that there is a room for improvement in his food and drink choices. - Suggestion is made for him to avoid simple carbohydrates  from his diet including Cakes, Sweet Desserts, Ice Cream, Soda (diet and regular), Sweet Tea, Candies, Chips, Cookies, Store Bought Juices, Alcohol , Artificial Sweeteners,  Coffee Creamer, and "Sugar-free" Products,  Lemonade. This will help patient to have more stable blood glucose profile and potentially avoid unintended weight gain.  The following Lifestyle Medicine recommendations according to [place]  [facility]) were discussed and and offered to patient and he  agrees to start the journey:  A. Whole Foods, Plant-Based Nutrition comprising of fruits and vegetables, plant-based proteins, whole-grain carbohydrates was discussed in detail with the patient.   A list for source of those nutrients were also provided to the patient.  Patient will use only water or unsweetened tea for hydration. B.  The need  to stay away from risky substances including alcohol, smoking; obtaining 7 to 9 hours of restorative sleep, at least 150 minutes of moderate intensity exercise weekly, the importance of healthy social connections,  and stress management techniques were discussed. C.  A full color page of  Calorie density of various food groups per pound showing examples of each food groups was provided to the patient.    - he will be scheduled with Jearld Fenton, RDN, CDE for diabetes education.  - I have approached him with the following individualized plan to manage  his diabetes and patient agrees:   -He was d/ced on Farxiga 10mg  po qday from the hospital. Based on his current glycemic response, he will not need insulin treatment for now.  He is advised to continue [place] 10mg  po qday, glipizide  5 mg XL p.o. daily only at breakfast.  He is advised to continue metformin 850 mg p.o. once a day.     -He is approached and willing to start monitoring blood glucose at least twice a day-daily before breakfast and at bed time and  report if fasting blood glucose is greater than 200 or less than 70.    - Specific targets for  A1c;  LDL, HDL, Triglycerides, and  Waist Circumference were discussed with the patient.  2) Blood Pressure /Hypertension: -His blood pressure is controlled to target.  He is  advised to continue amlodipine 10 mg p.o. daily at breakfast, recently added are Lisinopril 20mg , metoprolol ? Twice a day.   3) Lipids/Hyperlipidemia:   Review of his recent repeat lipid panel showed persistently elevated LDL at 133. He did not tolerate 40mg  of Crestor. He is advised to cut it and 10mg  po qhs, to advance as tolerated. The above diet will help with HPL.    4)  Weight/Diet:  Body mass index is 22.69 kg/m.  -    he is not a candidate for major weight loss. I discussed with him the fact that loss of 5 - 10% of his  current body weight will have the most impact on his diabetes management.  Exercise, and detailed carbohydrates information provided  -  detailed on discharge instructions.  5) Chronic Care/Health Maintenance:  -he  is on  Statin medications and  is encouraged to initiate and continue to follow up with Ophthalmology, Dentist,  Podiatrist at least yearly or according to recommendations, and advised to  quit smoking.  He quit smoking during the process of CAD. He advised not to restart.  -  I have recommended yearly flu vaccine and pneumonia vaccine at least every 5 years; moderate intensity exercise for up to 150 minutes weekly; and  sleep for at least 7 hours a day.  - he is  advised to maintain close follow up with [name], MD for primary care needs, as well as his other providers for optimal and coordinated care.      I spent 34 minutes in the care of the patient today including review of labs from [place], Lipids, Thyroid Function, Hematology (current and previous including abstractions from other facilities); face-to-face time discussing  his blood glucose readings/logs, discussing hypoglycemia and hyperglycemia episodes and symptoms, medications doses, his options of short and long term treatment based on the latest standards of care / guidelines;  discussion about incorporating lifestyle medicine;  and documenting the encounter.    Please refer to Patient  Instructions for Blood Glucose Monitoring and Insulin/Medications Dosing Guide"  in media tab for additional information.  Please  also refer to " Patient Self Inventory" in the Media  tab for reviewed elements of pertinent patient history.  Jacob Baker and his wife participated in the discussions, expressed understanding, and voiced agreement with the above plans.  All questions were answered to his satisfaction. he is encouraged to contact clinic should he have any questions or concerns prior to his return visit.    Follow up plan: - Return in about 3 months (around 04/30/2021) for Bring Meter and Logs- A1c in Office.  Glade Lloyd, MD [facility] [address] [place], [place] [address] Phone: [phone]  Fax: [phone]    01/30/2021, 8:55 PM  This note was partially dictated with voice recognition software. Similar sounding words can be transcribed inadequately or may not  be corrected upon review.

## 2021-01-30 NOTE — Patient Instructions (Signed)

## 2021-01-31 DIAGNOSIS — Z0279 Encounter for issue of other medical certificate: Secondary | ICD-10-CM

## 2021-02-03 ENCOUNTER — Other Ambulatory Visit: Payer: Self-pay | Admitting: Internal Medicine

## 2021-02-03 DIAGNOSIS — E1165 Type 2 diabetes mellitus with hyperglycemia: Secondary | ICD-10-CM

## 2021-02-03 NOTE — Telephone Encounter (Signed)
Pt is calling about update on FMLA paperwork

## 2021-02-04 ENCOUNTER — Telehealth: Payer: Self-pay

## 2021-02-04 ENCOUNTER — Other Ambulatory Visit (HOSPITAL_COMMUNITY): Payer: Self-pay

## 2021-02-04 NOTE — Telephone Encounter (Signed)
LMTCB. Need to let pt know that the [facility] paperwork has been completed and faxed to the number her provided. Also need to let him know that the original copy is up front for him to come pick up.

## 2021-02-05 ENCOUNTER — Telehealth: Payer: BC Managed Care – PPO | Admitting: Internal Medicine

## 2021-02-12 NOTE — Progress Notes (Signed)
Cardiology Office Note:    Date:  02/17/2021   ID:  Jacob Baker, DOB Mar 28, 1970, MRN [id number]  PCP:  Jacob Mc, MD  [facility] Cardiologist:  Jacob Dolly, MD  [facility] Electrophysiologist:  None   Referring MD: Jacob Mc, MD   Chief Complaint: 1 month follow-up  History of Present Illness:    Jacob Baker is a 51 y.o. male with a hx of with a history of CAD status post non-STEMI and diagonal/RCA stenting in early December 2022, hypertension, hyperlipidemia, diabetes, prior DVT, tobacco abuse, and GERD, who presents for follow-up after non-STEMI.   December 30, 2020, he presented to [facility] with chest pain and ruled in for non-STEMI.  He was transferred to [facility] and underwent diagnostic catheterization revealing severe first diagonal, proximal left circumflex, and proximal/mid RCA disease.  The first diagonal was felt to be the culprit and this was successfully treated with drug-eluting stent.  EF was normal by ventriculogram and echocardiogram.  He had recurrent chest pain on December 7 and went back to the Cath Lab and underwent staged intervention of the mid right coronary artery.  He has residual 85% stenosis in the proximal left circumflex however, this is felt to be a small vessel.  He was discharged home on aspirin and Brilinta on December 8.  Unfortunately, he was seen back in the emergency department on December 9 with abdominal discomfort.  Troponin was downtrending.  CT of the abdomen was unremarkable and he was discharged from the ED.    He was seen again in the emergency department on 12/12 with chest pain.  Troponin was 73 and downtrending from prior visit.  CT of the chest was negative for PE or dissection.  No acute findings on CT of the abdomen or pelvis.  Amlodipine was increased to 10 mg daily and he was placed on isosorbide mononitrate. He presented back to the emergency department on 12/13 with ongoing upper abdominal pain.  He was  initially hypertensive.  Right upper quadrant ultrasound showed gallbladder wall thickening with dependent sludge and probable tiny stones.  HIDA scan showed acute cholecystitis.  He was placed on intravenous Zosyn and seen by general surgery who felt that if symptoms persist in the outpatient setting, he may need placement of a cholecystectomy tube by interventional radiology.  Last seen 01/22/21 and was doing well from a cardiac standpoint.   Today, the patietn reports headache with Imdur. He is Imdur 30mg  daily. There have been days he has not taken it, but those days, his chest hurts. Asking about another medication for stable chest pain. Occasional SOB, not worse or different. NO LLE, orthopnea, pnd. Cannot get into cardiac rehab until Feb 27, 2021. He is taking statin still, but can only tolerate the 10mg  daily. Patient quit smoking December 4th.     Past Medical History:  Diagnosis Date   (HFpEF) heart failure with preserved ejection fraction ([place])    a. 12/2020 Echo: EF 65-70%, no rwma, mild LVH, nl RV fxn, triv MR.   CAD (coronary artery disease)    a. 12/2020 NSTEMI/PCI: LM nl, LAD min irregs, D1 99 (2.0x12 Onyx Frontier DES), LCX 85p (small), RCA 80p/m (4.0x18 Onyx Frontier DES - staged following day), 60d, RPDA mod dzs. EF 50-55%.   Diabetes mellitus without complication ([place])    DVT (deep venous thrombosis) ([place]) 04/2017   GERD (gastroesophageal reflux disease)    History of migraine    none in over 2  yrs   Hyperlipidemia    Hypertension    Syncope and collapse    x1 - approx 2-3 yrs ago - dehydration    Past Surgical History:  Procedure Laterality Date   COLONOSCOPY     COLONOSCOPY WITH PROPOFOL N/A 08/06/2017   Procedure: COLONOSCOPY WITH PROPOFOL;  Surgeon: Jacob Lame, MD;  Location: [place];  Service: Endoscopy;  Laterality: N/A;  Diabetic - oral meds   CORONARY STENT INTERVENTION N/A 12/31/2020   Procedure: CORONARY STENT INTERVENTION;  Surgeon: Jacob Crome, MD;  Location: [place] CV LAB;  Service: Cardiovascular;  Laterality: N/A;   CORONARY STENT INTERVENTION N/A 01/01/2021   Procedure: CORONARY STENT INTERVENTION;  Surgeon: Jacob Booze, MD;  Location: [place] CV [facility];  Service: Cardiovascular;  Laterality: N/A;   ESOPHAGEAL DILATION  09/24/2016   Procedure: ESOPHAGEAL DILATION;  Surgeon: Jacob Lame, MD;  Location: [place];  Service: Gastroenterology;;   ESOPHAGOGASTRODUODENOSCOPY (EGD) WITH PROPOFOL N/A 04/08/2015   Procedure: ESOPHAGOGASTRODUODENOSCOPY (EGD) WITH PROPOFOL with dialation;  Surgeon: Jacob Lame, MD;  Location: [place];  Service: Endoscopy;  Laterality: N/A;  diabetic - oral meds   ESOPHAGOGASTRODUODENOSCOPY (EGD) WITH PROPOFOL N/A 09/24/2016   Procedure: ESOPHAGOGASTRODUODENOSCOPY (EGD) WITH PROPOFOL WITH DILATION;  Surgeon: Jacob Lame, MD;  Location: [place];  Service: Gastroenterology;  Laterality: N/A;  Diabetic - oral meds   FINGER AMPUTATION  2009   partial removal left index finger    LEFT HEART CATH AND CORONARY ANGIOGRAPHY N/A 12/31/2020   Procedure: LEFT HEART CATH AND CORONARY ANGIOGRAPHY;  Surgeon: Jacob Crome, MD;  Location: [place] CV [facility];  Service: Cardiovascular;  Laterality: N/A;   LEFT HEART CATH AND CORONARY ANGIOGRAPHY N/A 01/01/2021   Procedure: LEFT HEART CATH AND CORONARY ANGIOGRAPHY;  Surgeon: Jacob Booze, MD;  Location: [place] CV [facility];  Service: Cardiovascular;  Laterality: N/A;   POLYPECTOMY  08/06/2017   Procedure: POLYPECTOMY INTESTINAL;  Surgeon: Jacob Lame, MD;  Location: [facility];  Service: Endoscopy;;    Current Medications: Current Meds  Medication Sig   acetaminophen (TYLENOL) 325 MG tablet Take 2 tablets (650 mg total) by mouth every 6 (six) hours as needed for mild pain or fever.   amLODipine (NORVASC) 10 MG tablet Take 1 tablet (10 mg total) by mouth daily.   aspirin 81 MG EC tablet Take 1 tablet (81 mg  total) by mouth daily with breakfast. Swallow whole.   dapagliflozin propanediol (FARXIGA) 10 MG TABS tablet Take 1 tablet (10 mg total) by mouth daily.   glipiZIDE (GLUCOTROL XL) 5 MG 24 hr tablet Take 2 tablets (10 mg total) by mouth daily with breakfast. (Patient taking differently: Take 5 mg by mouth daily with breakfast.)   glucose blood (ONETOUCH VERIO) test strip Test sugars twice daily   lisinopril (ZESTRIL) 20 MG tablet Take 1 tablet (20 mg total) by mouth daily.   metFORMIN (GLUCOPHAGE) 850 MG tablet TAKE 1 TABLET(850 MG) BY MOUTH TWICE DAILY WITH A MEAL.   metoprolol tartrate (LOPRESSOR) 25 MG tablet Take 1 tablet (25 mg total) by mouth 2 (two) times daily.   nitroGLYCERIN (NITROSTAT) 0.4 MG SL tablet Place 1 tablet (0.4 mg total) under the tongue every 5 (five) minutes as needed for chest pain.   ondansetron (ZOFRAN) 4 MG tablet Take 1 tablet (4 mg total) by mouth daily as needed for nausea or vomiting.   ONETOUCH DELICA LANCETS 60Y MISC Test sugars twice daily   oxyCODONE-acetaminophen (PERCOCET)  7.5-325 MG tablet Take 1 tablet by mouth every 6 (six) hours as needed for moderate pain or severe pain.   pantoprazole (PROTONIX) 40 MG tablet Take 1 tablet (40 mg total) by mouth daily.   ranolazine (RANEXA) 500 MG 12 hr tablet Take 1 tablet (500 mg total) by mouth 2 (two) times daily.   rosuvastatin (CRESTOR) 10 MG tablet Take 1 tablet (10 mg total) by mouth daily.   ticagrelor (BRILINTA) 90 MG TABS tablet Take 1 tablet (90 mg total) by mouth 2 (two) times daily.   [DISCONTINUED] carvedilol (COREG) 12.5 MG tablet Take 1 tablet (12.5 mg total) by mouth 2 (two) times daily with a meal.   [DISCONTINUED] isosorbide mononitrate (IMDUR) 30 MG 24 hr tablet Take 1 tablet (30 mg total) by mouth daily. For heart     Allergies:   Patient has no known allergies.   Social History   Socioeconomic History   Marital status: Married    Spouse name: Not on file   Number of children: Not on file    Years of education: Not on file   Highest education level: Not on file  Occupational History   Not on file  Tobacco Use   Smoking status: Former    Packs/day: 2.00    Years: 27.00    Pack years: 54.00    Types: Cigarettes    Quit date: 12/30/2020    Years since quitting: 0.1   Smokeless tobacco: Never  Vaping Use   Vaping Use: Never used  Substance and Sexual Activity   Alcohol use: No   Drug use: No   Sexual activity: Not on file  Other Topics Concern   Not on file  Social History Narrative   Not on file   Social Determinants of Health   Financial Resource Strain: Not on file  Food Insecurity: Not on file  Transportation Needs: Not on file  Physical Activity: Not on file  Stress: Not on file  Social Connections: Not on file     Family History: The patient's family history includes Diabetes in his father; Heart disease in his father; Hypertension in his brother, brother, father, mother, and sister.  ROS:   Please see the history of present illness.     All other systems reviewed and are negative.  EKGs/Labs/Other Studies Reviewed:    The following studies were reviewed today:  LHC 12/6 CONCLUSIONS: High-grade stenosis in the large mid diagonal branch 99% reduced to 0% with TIMI-3 flow using a 2.0 x 12 Onyx frontier.  Chest discomfort was dissimilar to presenting complaint. Left main is normal LAD has mild diffuse disease Circumflex contains 80 to 90% proximal stenosis.  The vessel is diffusely diseased and gives origin to 2-2 small obtuse marginal branches.  If symptoms continue, perhaps this proximal lesion should be stented. RCA has a "rosary bead "appearance.  It is a large vessel.  There is 60% mid, 60% distal, diffuse disease in the PDA and LV branches. Left ventriculography demonstrated mid anterior wall hypokinesis.  EF 50%.  LVEDP is normal.   RECOMMENDATIONS:   Aggressive risk factor modification. Smoking cessation. Monitor for recurrent symptoms and  consider PCI of circumflex if symptoms do not abate with PCI of this moderate-sized diagonal branch  LHC 12/7     Prox Cx lesion is 85% stenosed.  This was a small vessel that was diffusely diseased.   Prox RCA to Mid RCA lesion is 80% stenosed.   A drug-eluting stent was successfully placed using  a [place] 4.0X18, posdilated to 4.5 mm.   Post intervention, there is a 0% residual stenosis.   Dist RCA lesion is 50% stenosed.   Diagonal stent was widely patent.   LV end diastolic pressure is normal.   There is no aortic valve stenosis.   Successful PCI of RCA.  He needs aggressive secondary prevention, smoking cessation.  Would consider changing Plavix to Brilinta since he had ACS.    Coronary Diagrams  Diagnostic Dominance: Right Intervention    Echo 12/31/20  1. Left ventricular ejection fraction, by estimation, is 65 to 70%. The  left ventricle has normal function. The left ventricle has no regional  wall motion abnormalities. There is mild left ventricular hypertrophy.  Left ventricular diastolic parameters  were normal.   2. Right ventricular systolic function is normal. The right ventricular  size is normal. Tricuspid regurgitation signal is inadequate for assessing  PA pressure.   3. The mitral valve is normal in structure. Trivial mitral valve  regurgitation. No evidence of mitral stenosis.   4. The aortic valve is tricuspid. Aortic valve regurgitation is not  visualized. No aortic stenosis is present.   5. The inferior vena cava is normal in size with greater than 50%  respiratory variability, suggesting right atrial pressure of 3 mmHg.   EKG:  EKG is not ordered today.   Recent Labs: 12/31/2020: Magnesium 2.1 01/07/2021: B Natriuretic Peptide 66.0 01/13/2021: ALT 37; BUN 14; Creatinine, Ser 0.91; Hemoglobin 9.4; Platelets 321; Potassium 3.5; Sodium 136  Recent Lipid Panel    Component Value Date/Time   CHOL 248 (H) 01/01/2021 0406   CHOL 192  08/21/2020 0824   CHOL 180 03/23/2013 0422   TRIG 202 (H) 01/01/2021 0406   TRIG 218 (H) 03/23/2013 0422   HDL 30 (L) 01/01/2021 0406   HDL 28 (L) 08/21/2020 0824   HDL 21 (L) 03/23/2013 0422   CHOLHDL 8.3 01/01/2021 0406   VLDL 40 01/01/2021 0406   VLDL 44 (H) 03/23/2013 0422   LDLCALC 178 (H) 01/01/2021 0406   LDLCALC 133 (H) 08/21/2020 0824   LDLCALC 127 (H) 11/16/2019 1505   LDLCALC 115 (H) 03/23/2013 0422   LDLDIRECT 176.0 10/18/2018 1406      Physical Exam:    VS:  BP 132/76    Pulse 82    Ht 6\' 1"  (1.854 m)    Wt 177 lb (80.3 kg)    SpO2 98%    BMI 23.35 kg/m     Wt Readings from Last 3 Encounters:  02/17/21 177 lb (80.3 kg)  01/30/21 172 lb (78 kg)  01/24/21 172 lb (78 kg)     GEN:  Well nourished, well developed in no acute distress HEENT: Normal NECK: No JVD; No carotid bruits LYMPHATICS: No lymphadenopathy CARDIAC: RRR, no murmurs, rubs, gallops RESPIRATORY:  Clear to auscultation without rales, wheezing or rhonchi  ABDOMEN: Soft, non-tender, non-distended MUSCULOSKELETAL:  No edema; No deformity  SKIN: Warm and dry NEUROLOGIC:  Alert and oriented x 3 PSYCHIATRIC:  Normal affect   ASSESSMENT:    1. Coronary artery disease involving native coronary artery of native heart without angina pectoris   2. Dyslipidemia with low high density lipoprotein (HDL) cholesterol with hypertriglyceridemia due to type 2 diabetes mellitus ([place])   3. Hyperlipidemia LDL goal <70   4. Essential hypertension    PLAN:    In order of problems listed above:  CAD with recent NSTEMI s/p DESx2  LHC as above with stent in  the RCA and mid diag branch, residual disease noted. He is not tolerating Imdur due to headache. When he doesn't take Imdur, however, he experiences chest pain. He is on amlodipine 10mg . We will stop Imdur and try Ranexa 500mg  BID.  Also on double therapy with Coreg and Lopressor. We will stop the coreg and continue the Lopressor 25mg  BID. Continue DAPT with ASA  and Brilinta. 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 He will start Cardiac Rehab Feb 27, 2021.   HLD LDL 176 12/2020. He did not tolerate Crestor 40, so he is on 10mg . Re-check lipid profile in 6-8 weeks, suspect he needs lipid clinic referral.   HTN Stopping coreg as he was on double BB therapy Continue Lopressor, Lisinopril and amlodipine. Stopping Imdur for headaches.   Tobacco use he quit smoking Dec 4th 2022. He was congratulated.   Disposition: Follow up in 6-8 week(s) with MD/APP     Signed, Linder Prajapati Ninfa Meeker, PA-C  02/17/2021 3:03 PM    [facility]

## 2021-02-14 ENCOUNTER — Telehealth: Payer: Self-pay

## 2021-02-14 NOTE — Telephone Encounter (Signed)
Transition Care Management Unsuccessful Follow-up Telephone Call  Date of discharge and from where:  01/13/2021  [facility]  Attempts:  1st Attempt  Reason for unsuccessful TCM follow-up call:  No answer/busy Tomasa Rand, RN, BSN, CEN [place] Coordinator [phone]

## 2021-02-17 ENCOUNTER — Ambulatory Visit: Payer: BC Managed Care – PPO | Admitting: Medical

## 2021-02-17 ENCOUNTER — Other Ambulatory Visit: Payer: Self-pay | Admitting: "Endocrinology

## 2021-02-17 ENCOUNTER — Telehealth: Payer: Self-pay

## 2021-02-17 ENCOUNTER — Other Ambulatory Visit: Payer: Self-pay

## 2021-02-17 ENCOUNTER — Telehealth: Payer: Self-pay | Admitting: "Endocrinology

## 2021-02-17 ENCOUNTER — Encounter: Payer: Self-pay | Admitting: Medical

## 2021-02-17 VITALS — BP 132/76 | HR 82 | Ht 73.0 in | Wt 177.0 lb

## 2021-02-17 DIAGNOSIS — E1169 Type 2 diabetes mellitus with other specified complication: Secondary | ICD-10-CM | POA: Diagnosis not present

## 2021-02-17 DIAGNOSIS — I10 Essential (primary) hypertension: Secondary | ICD-10-CM | POA: Diagnosis not present

## 2021-02-17 DIAGNOSIS — E782 Mixed hyperlipidemia: Secondary | ICD-10-CM

## 2021-02-17 DIAGNOSIS — E785 Hyperlipidemia, unspecified: Secondary | ICD-10-CM

## 2021-02-17 DIAGNOSIS — E1165 Type 2 diabetes mellitus with hyperglycemia: Secondary | ICD-10-CM

## 2021-02-17 DIAGNOSIS — I2510 Atherosclerotic heart disease of native coronary artery without angina pectoris: Secondary | ICD-10-CM | POA: Diagnosis not present

## 2021-02-17 MED ORDER — RANOLAZINE ER 500 MG PO TB12: 500.0000 mg | ORAL_TABLET | Freq: Two times a day (BID) | ORAL | 11 refills | Status: DC

## 2021-02-17 MED ORDER — METFORMIN HCL 850 MG PO TABS: ORAL_TABLET | ORAL | 1 refills | Status: DC

## 2021-02-17 NOTE — Telephone Encounter (Signed)
Transition Care Management Unsuccessful Follow-up Telephone Call  Date of discharge and from where:  01/13/2021 [facility]  Attempts:  2nd Attempt  Reason for unsuccessful TCM follow-up call:  No answer/busy Tomasa Rand, RN, BSN, CEN [place] Coordinator [phone]

## 2021-02-17 NOTE — Patient Instructions (Signed)
Medication Instructions:   STOP Imdur  STOP Coreg   START  Ranexa 500 mg twice a day  Labwork: Fasting Lipids and Liver function tests  Testing/Procedures: None    Follow-Up: 6-8 weeks  Any Other Special Instructions Will Be Listed Below (If Applicable).   Letter given for work  If you need a refill on your cardiac medications before your next appointment, please call your pharmacy.

## 2021-02-17 NOTE — Telephone Encounter (Signed)
Patient states his PCP will not refill his Metformin 850 MG. He is asking for a refill. [facility] on [address]

## 2021-02-19 NOTE — Telephone Encounter (Signed)
Pt called in stating that his disability forms are coming from [employer]

## 2021-02-20 ENCOUNTER — Other Ambulatory Visit: Payer: Self-pay

## 2021-02-20 ENCOUNTER — Ambulatory Visit: Payer: BC Managed Care – PPO | Admitting: Internal Medicine

## 2021-02-20 ENCOUNTER — Encounter: Payer: Self-pay | Admitting: Internal Medicine

## 2021-02-20 VITALS — BP 136/80 | HR 71 | Temp 98.1°F | Ht 73.0 in | Wt 176.6 lb

## 2021-02-20 DIAGNOSIS — K2960 Other gastritis without bleeding: Secondary | ICD-10-CM

## 2021-02-20 DIAGNOSIS — Z87891 Personal history of nicotine dependence: Secondary | ICD-10-CM | POA: Diagnosis not present

## 2021-02-20 DIAGNOSIS — E785 Hyperlipidemia, unspecified: Secondary | ICD-10-CM

## 2021-02-20 DIAGNOSIS — E782 Mixed hyperlipidemia: Secondary | ICD-10-CM

## 2021-02-20 DIAGNOSIS — K810 Acute cholecystitis: Secondary | ICD-10-CM

## 2021-02-20 DIAGNOSIS — K811 Chronic cholecystitis: Secondary | ICD-10-CM

## 2021-02-20 DIAGNOSIS — Z955 Presence of coronary angioplasty implant and graft: Secondary | ICD-10-CM

## 2021-02-20 DIAGNOSIS — Z8719 Personal history of other diseases of the digestive system: Secondary | ICD-10-CM

## 2021-02-20 DIAGNOSIS — E1169 Type 2 diabetes mellitus with other specified complication: Secondary | ICD-10-CM

## 2021-02-20 DIAGNOSIS — T39395A Adverse effect of other nonsteroidal anti-inflammatory drugs [NSAID], initial encounter: Secondary | ICD-10-CM

## 2021-02-20 LAB — COMPREHENSIVE METABOLIC PANEL
ALT: 20 U/L (ref 0–53)
AST: 11 U/L (ref 0–37)
Albumin: 4.9 g/dL (ref 3.5–5.2)
Alkaline Phosphatase: 108 U/L (ref 39–117)
BUN: 11 mg/dL (ref 6–23)
CO2: 27 mEq/L (ref 19–32)
Calcium: 10 mg/dL (ref 8.4–10.5)
Chloride: 104 mEq/L (ref 96–112)
Creatinine, Ser: 0.95 mg/dL (ref 0.40–1.50)
GFR: 91.82 mL/min (ref >60.00)
Glucose, Bld: 117 mg/dL — ABNORMAL HIGH (ref 70–99)
Potassium: 3.9 mEq/L (ref 3.5–5.1)
Sodium: 141 mEq/L (ref 135–145)
Total Bilirubin: 0.9 mg/dL (ref 0.2–1.2)
Total Protein: 7.3 g/dL (ref 6.0–8.3)

## 2021-02-20 LAB — CBC WITH DIFFERENTIAL/PLATELET
Basophils Absolute: 0 10*3/uL (ref 0.0–0.1)
Basophils Relative: 0.5 % (ref 0.0–3.0)
Eosinophils Absolute: 0.1 10*3/uL (ref 0.0–0.7)
Eosinophils Relative: 1 % (ref 0.0–5.0)
HCT: 34 % — ABNORMAL LOW (ref 39.0–52.0)
Hemoglobin: 11.6 g/dL — ABNORMAL LOW (ref 13.0–17.0)
Lymphocytes Relative: 15.2 % (ref 12.0–46.0)
Lymphs Abs: 1.3 10*3/uL (ref 0.7–4.0)
MCHC: 34 g/dL (ref 30.0–36.0)
MCV: 81.5 fl (ref 78.0–100.0)
Monocytes Absolute: 0.5 10*3/uL (ref 0.1–1.0)
Monocytes Relative: 5.6 % (ref 3.0–12.0)
Neutro Abs: 6.5 10*3/uL (ref 1.4–7.7)
Neutrophils Relative %: 77.7 % — ABNORMAL HIGH (ref 43.0–77.0)
Platelets: 270 10*3/uL (ref 150.0–400.0)
RBC: 4.18 Mil/uL — ABNORMAL LOW (ref 4.22–5.81)
RDW: 15.7 % — ABNORMAL HIGH (ref 11.5–15.5)
WBC: 8.3 10*3/uL (ref 4.0–10.5)

## 2021-02-20 MED ORDER — PANTOPRAZOLE SODIUM 40 MG PO TBEC: 40.0000 mg | DELAYED_RELEASE_TABLET | Freq: Two times a day (BID) | ORAL | 0 refills | Status: DC

## 2021-02-20 NOTE — Progress Notes (Signed)
Subjective:  Patient ID: Jacob Baker, male    DOB: 08-03-1970  Age: 51 y.o. MRN: [id number]  CC: The primary encounter diagnosis was Chronic cholecystitis. Diagnoses of Acute cholecystitis, Gastritis due to nonsteroidal anti-inflammatory drug (NSAID), History of tobacco abuse, Hyperlipidemia associated with type 2 diabetes mellitus ([place]), Dyslipidemia with low high density lipoprotein (HDL) cholesterol with hypertriglyceridemia due to type 2 diabetes mellitus ([place]), CAD/Status post coronary artery stent placement 12/2020, and History of acute cholecystitis were also pertinent to this visit.   This visit occurred during the SARS-CoV-2 public health emergency.  Safety protocols were in place, including screening questions prior to the visit, additional usage of staff PPE, and extensive cleaning of exam room while observing appropriate contact time as indicated for disinfecting solutions.    HPI Jacob Baker presents for follow up on multiple issues , including CAD with recent STEMI complicated by acute  cholecystitis  Chief Complaint  Patient presents with   Follow-up    Follow up    1) history of acute cholecystitis:  since his hospitalization Dec 13  he has regained 5 of the 23  lbs he lost in December. Due to return of appetite.  He is adhering to a low fat, low carb diet.  He has has occasional episodes of RUQ pain ,  more frequently at bedtime,  which lasts for 1-2 hours, sometimes wakes him up.  Taking tylenol prn but not helping.  He was seen by general surgery in house,  but surgery was deferred because of his recent cardiac stenting requiring uninterrupted use of DAPT.  He was not referred to a general surgeon at discharge from hospital but was told he might need to have a cholecystectomy  in 6 months    2) CAD:  has days of chest pain  at rest  . Imdur was stopped due to side effects of headache.   ranexa started on Jan 23.    2)  Hyperlipidemia : he is tolerating  10 mg  crestor  3) DM:  blood sugars 110 fasting.    Has not returned to work yet due to generalized weakness resulting from his MI and weight loss.  Cleared by cardiology to return for light duty  6 to 8 hours, but not cleared to drive or operate machinery until FEB 7 .  Energy level improving , he is currently able to remain upright and awake from 10 am to 11 pm without an issue.   His short term disability was approved for 4 weeks Dec 13 through Jan 11.  He has paperwork that needs completing ASAP  `  Outpatient Medications Prior to Visit  Medication Sig Dispense Refill   acetaminophen (TYLENOL) 325 MG tablet Take 2 tablets (650 mg total) by mouth every 6 (six) hours as needed for mild pain or fever. 12 tablet 0   amLODipine (NORVASC) 10 MG tablet Take 1 tablet (10 mg total) by mouth daily. 30 tablet 1   aspirin 81 MG EC tablet Take 1 tablet (81 mg total) by mouth daily with breakfast. Swallow whole. 90 tablet 3   dapagliflozin propanediol (FARXIGA) 10 MG TABS tablet Take 1 tablet (10 mg total) by mouth daily. 30 tablet 11   glipiZIDE (GLUCOTROL XL) 5 MG 24 hr tablet Take 2 tablets (10 mg total) by mouth daily with breakfast. (Patient taking differently: Take 5 mg by mouth daily with breakfast.) 180 tablet 1   glucose blood (ONETOUCH VERIO) test strip Test sugars twice daily  50 each 6   lisinopril (ZESTRIL) 20 MG tablet Take 1 tablet (20 mg total) by mouth daily. 30 tablet 11   metFORMIN (GLUCOPHAGE) 850 MG tablet TAKE 1 TABLET(850 MG) BY MOUTH TWICE DAILY WITH A MEAL. 180 tablet 1   metoprolol tartrate (LOPRESSOR) 25 MG tablet Take 1 tablet (25 mg total) by mouth 2 (two) times daily. 60 tablet 11   mirtazapine (REMERON) 7.5 MG tablet Take 1 tablet (7.5 mg total) by mouth at bedtime. 90 tablet 1   nitroGLYCERIN (NITROSTAT) 0.4 MG SL tablet Place 1 tablet (0.4 mg total) under the tongue every 5 (five) minutes as needed for chest pain. 25 tablet 1   ondansetron (ZOFRAN) 4 MG tablet Take 1 tablet (4 mg  total) by mouth daily as needed for nausea or vomiting. 30 tablet 1   ONETOUCH DELICA LANCETS 98J MISC Test sugars twice daily 50 each 3   oxyCODONE-acetaminophen (PERCOCET) 7.5-325 MG tablet Take 1 tablet by mouth every 6 (six) hours as needed for moderate pain or severe pain. 12 tablet 0   polyethylene glycol (MIRALAX) 17 g packet Take 17 g by mouth daily. 30 each 1   ranolazine (RANEXA) 500 MG 12 hr tablet Take 1 tablet (500 mg total) by mouth 2 (two) times daily. 90 tablet 11   rosuvastatin (CRESTOR) 10 MG tablet Take 1 tablet (10 mg total) by mouth daily. 90 tablet 1   ticagrelor (BRILINTA) 90 MG TABS tablet Take 1 tablet (90 mg total) by mouth 2 (two) times daily. 60 tablet 11   pantoprazole (PROTONIX) 40 MG tablet Take 1 tablet (40 mg total) by mouth daily. 30 tablet 5   No facility-administered medications prior to visit.    Review of Systems;  Patient denies headache, fevers, malaise, unintentional weight loss, skin rash, eye pain, sinus congestion and sinus pain, sore throat, dysphagia,  hemoptysis , cough, dyspnea, wheezing, chest pain, palpitations, orthopnea, edema, abdominal pain, nausea, melena, diarrhea, constipation, flank pain, dysuria, hematuria, urinary  Frequency, nocturia, numbness, tingling, seizures,  Focal weakness, Loss of consciousness,  Tremor, insomnia, depression, anxiety, and suicidal ideation.      Objective:  BP 136/80 (BP Location: Left Arm, Patient Position: Sitting, Cuff Size: Normal)    Pulse 71    Temp 98.1 F (36.7 C) (Oral)    Ht 6\' 1"  (1.854 m)    Wt 176 lb 9.6 oz (80.1 kg)    SpO2 98%    BMI 23.30 kg/m   BP Readings from Last 3 Encounters:  02/20/21 136/80  02/17/21 132/76  01/30/21 108/72    Wt Readings from Last 3 Encounters:  02/20/21 176 lb 9.6 oz (80.1 kg)  02/17/21 177 lb (80.3 kg)  01/30/21 172 lb (78 kg)    General appearance: alert, cooperative and appears stated age Ears: normal TM's and external ear canals both ears Throat:  lips, mucosa, and tongue normal; teeth and gums normal Neck: no adenopathy, no carotid bruit, supple, symmetrical, trachea midline and thyroid not enlarged, symmetric, no tenderness/mass/nodules Back: symmetric, no curvature. ROM normal. No CVA tenderness. Lungs: clear to auscultation bilaterally Heart: regular rate and rhythm, S1, S2 normal, no murmur, click, rub or gallop Abdomen: soft, non-tender; bowel sounds normal; no masses,  no organomegaly Pulses: 2+ and symmetric Skin: Skin color, texture, turgor normal. No rashes or lesions Lymph nodes: Cervical, supraclavicular, and axillary nodes normal.  Lab Results  Component Value Date   HGBA1C 7.4 (H) 12/31/2020   HGBA1C 6.6 (H) 08/21/2020   HGBA1C  8.0 (A) 05/22/2020    Lab Results  Component Value Date   CREATININE 0.95 02/20/2021   CREATININE 0.91 01/13/2021   CREATININE 0.90 01/12/2021    Lab Results  Component Value Date   WBC 8.3 02/20/2021   HGB 11.6 (L) 02/20/2021   HCT 34.0 (L) 02/20/2021   PLT 270.0 02/20/2021   GLUCOSE 117 (H) 02/20/2021   CHOL 248 (H) 01/01/2021   TRIG 202 (H) 01/01/2021   HDL 30 (L) 01/01/2021   LDLDIRECT 176.0 10/18/2018   LDLCALC 178 (H) 01/01/2021   ALT 20 02/20/2021   AST 11 02/20/2021   NA 141 02/20/2021   K 3.9 02/20/2021   CL 104 02/20/2021   CREATININE 0.95 02/20/2021   BUN 11 02/20/2021   CO2 27 02/20/2021   TSH 1.32 11/16/2019   PSA 0.72 10/21/2020   INR 0.96 10/13/2011   HGBA1C 7.4 (H) 12/31/2020   MICROALBUR 30 05/22/2020    DG Chest 2 View  Result Date: 01/07/2021 CLINICAL DATA:  Chest pain and shortness of breath. EXAM: CHEST - 2 VIEW COMPARISON:  CT chest from same day.  Chest x-ray from yesterday. FINDINGS: The heart size and mediastinal contours are within normal limits. Both lungs are clear. The visualized skeletal structures are unremarkable. IMPRESSION: No active cardiopulmonary disease. Electronically Signed   By: Titus Dubin M.D.   On: 01/07/2021 12:18   CT  Angio Chest PE W and/or Wo Contrast  Result Date: 01/07/2021 CLINICAL DATA:  Chest pain, status post heart attack with stent placement, upper abdominal pain. EXAM: CT ANGIOGRAPHY CHEST CT ABDOMEN AND PELVIS WITH CONTRAST TECHNIQUE: Multidetector CT imaging of the chest was performed using the standard protocol during bolus administration of intravenous contrast. Multiplanar CT image reconstructions and MIPs were obtained to evaluate the vascular anatomy. Multidetector CT imaging of the abdomen and pelvis was performed using the standard protocol during bolus administration of intravenous contrast. CONTRAST:  150mL OMNIPAQUE IOHEXOL 350 MG/ML SOLN COMPARISON:  CT abdomen/pelvis dated 01/03/2021. CTA chest abdomen pelvis dated 12/30/2020. FINDINGS: CTA CHEST FINDINGS Cardiovascular: Satisfactory opacification of the bilateral pulmonary arteries to the segmental level. No evidence of pulmonary embolism. Although not tailored for evaluation of the thoracic aorta, there is no evidence of thoracic aortic aneurysm or dissection. The heart is normal in size.  No pericardial effusion. Coronary stent in the 1st diagonal. Coronary atherosclerosis of the right coronary artery. Mediastinum/Nodes: No suspicious mediastinal lymphadenopathy. Visualized thyroid is unremarkable. Lungs/Pleura: Mild dependent atelectasis in the bilateral lower lobes. No focal consolidation. No suspicious pulmonary nodules. No pleural effusion or pneumothorax. Musculoskeletal: Degenerative changes of the thoracic spine. Review of the MIP images confirms the above findings. CT ABDOMEN and PELVIS FINDINGS Hepatobiliary: Liver is within normal limits. Gallbladder is unremarkable. No intrahepatic or extrahepatic ductal dilatation. Pancreas: Within normal limits. Spleen: Within normal limits. Adrenals/Urinary Tract: Adrenal glands are within normal limits. Subcentimeter right renal cysts. Left kidney is within normal limits. No hydronephrosis. Bladder is  within normal limits. Stomach/Bowel: Stomach is within normal limits No evidence of bowel obstruction. Normal appendix (series 2/image 69). No colonic wall thickening or inflammatory changes. Vascular/Lymphatic: No evidence of abdominal aortic aneurysm. Atherosclerotic calcifications of the abdominal aorta and branch vessels. No suspicious abdominopelvic lymphadenopathy. Reproductive: Mild prostatomegaly. Other: No abdominopelvic ascites. Musculoskeletal: Visualized osseous structures are within normal limits. Review of the MIP images confirms the above findings. IMPRESSION: No evidence of pulmonary embolism. No acute findings in the chest, abdomen, or pelvis. No interval change from recent CTs. Electronically  Signed   By: Julian Hy M.D.   On: 01/07/2021 01:14   NM Hepatobiliary Liver Func  Result Date: 01/08/2021 CLINICAL DATA:  Upper abdominal pain, RIGHT upper quadrant pain, nausea, vomiting EXAM: NUCLEAR MEDICINE HEPATOBILIARY IMAGING TECHNIQUE: Sequential images of the abdomen were obtained out to 60 minutes following intravenous administration of radiopharmaceutical. RADIOPHARMACEUTICALS:  5.3 mCi Tc-33m  Choletec IV COMPARISON:  CT abdomen and pelvis 01/07/2021, ultrasound RIGHT upper quadrant 01/07/2021 FINDINGS: Prompt tracer extraction from bloodstream indicating normal hepatocellular function. Prompt excretion of tracer into biliary tree. Small bowel visualized at 16 minutes. At 1 hour gallbladder had not visualized. Patient then received 3 mg of morphine IV and imaging was continued for 30 minutes. Gallbladder failed to visualize following morphine administration. Findings are consistent with cystic duct obstruction and acute cholecystitis. IMPRESSION: Patent CBD. Nonvisualization of gallbladder despite morphine administration consistent with acute cholecystitis. Electronically Signed   By: Lavonia Dana M.D.   On: 01/08/2021 15:03   CT ABDOMEN PELVIS W CONTRAST  Result Date:  01/07/2021 CLINICAL DATA:  Chest pain, status post heart attack with stent placement, upper abdominal pain. EXAM: CT ANGIOGRAPHY CHEST CT ABDOMEN AND PELVIS WITH CONTRAST TECHNIQUE: Multidetector CT imaging of the chest was performed using the standard protocol during bolus administration of intravenous contrast. Multiplanar CT image reconstructions and MIPs were obtained to evaluate the vascular anatomy. Multidetector CT imaging of the abdomen and pelvis was performed using the standard protocol during bolus administration of intravenous contrast. CONTRAST:  120mL OMNIPAQUE IOHEXOL 350 MG/ML SOLN COMPARISON:  CT abdomen/pelvis dated 01/03/2021. CTA chest abdomen pelvis dated 12/30/2020. FINDINGS: CTA CHEST FINDINGS Cardiovascular: Satisfactory opacification of the bilateral pulmonary arteries to the segmental level. No evidence of pulmonary embolism. Although not tailored for evaluation of the thoracic aorta, there is no evidence of thoracic aortic aneurysm or dissection. The heart is normal in size.  No pericardial effusion. Coronary stent in the 1st diagonal. Coronary atherosclerosis of the right coronary artery. Mediastinum/Nodes: No suspicious mediastinal lymphadenopathy. Visualized thyroid is unremarkable. Lungs/Pleura: Mild dependent atelectasis in the bilateral lower lobes. No focal consolidation. No suspicious pulmonary nodules. No pleural effusion or pneumothorax. Musculoskeletal: Degenerative changes of the thoracic spine. Review of the MIP images confirms the above findings. CT ABDOMEN and PELVIS FINDINGS Hepatobiliary: Liver is within normal limits. Gallbladder is unremarkable. No intrahepatic or extrahepatic ductal dilatation. Pancreas: Within normal limits. Spleen: Within normal limits. Adrenals/Urinary Tract: Adrenal glands are within normal limits. Subcentimeter right renal cysts. Left kidney is within normal limits. No hydronephrosis. Bladder is within normal limits. Stomach/Bowel: Stomach is  within normal limits No evidence of bowel obstruction. Normal appendix (series 2/image 69). No colonic wall thickening or inflammatory changes. Vascular/Lymphatic: No evidence of abdominal aortic aneurysm. Atherosclerotic calcifications of the abdominal aorta and branch vessels. No suspicious abdominopelvic lymphadenopathy. Reproductive: Mild prostatomegaly. Other: No abdominopelvic ascites. Musculoskeletal: Visualized osseous structures are within normal limits. Review of the MIP images confirms the above findings. IMPRESSION: No evidence of pulmonary embolism. No acute findings in the chest, abdomen, or pelvis. No interval change from recent CTs. Electronically Signed   By: Julian Hy M.D.   On: 01/07/2021 01:14   US Abdomen Limited RUQ (LIVER/GB)  Result Date: 01/07/2021 CLINICAL DATA:  5 day history of epigastric pain. EXAM: ULTRASOUND ABDOMEN LIMITED RIGHT UPPER QUADRANT COMPARISON:  None. FINDINGS: Gallbladder: Gallbladder wall is thickened up to 5 mm. There is dependent nonshadowing sludge and probable tiny stones. No pericholecystic fluid. Sonographer reports no sonographic Murphy sign. Common bile  duct: Diameter: 4-5 mm Liver: No focal lesion identified. Within normal limits in parenchymal echogenicity. Portal vein is patent on color Doppler imaging with normal direction of blood flow towards the liver. Other: None. IMPRESSION: Gallbladder wall thickening with dependent sludge and probable tiny stones. No pericholecystic fluid or sonographic Murphy sign. No biliary dilatation. Nuclear scintigraphy may prove helpful to further evaluate if cystic duct obstruction is a clinical concern. Electronically Signed   By: Misty Stanley M.D.   On: 01/07/2021 16:01    Assessment & Plan:   Problem List Items Addressed This Visit     Gastritis due to nonsteroidal anti-inflammatory drug (NSAID)    He is avoiding all NSAIDs and using tylenol only for pain . Reminded that his risk of GI  Bleed is high due  to use of DAPT if he resumes NSAIDs      History of tobacco abuse   Hyperlipidemia associated with type 2 diabetes mellitus ([place])    Continue Crestor,  Started during hospitalization Will adjust dose for goal LDL > 70       Relevant Orders   Lipid panel   Hemoglobin A1c   Dyslipidemia with low high density lipoprotein (HDL) cholesterol with hypertriglyceridemia due to type 2 diabetes mellitus ([place])    He is tolerating minimal dose of rosuvastatin.  If LDL is not  70 or less will need PCSK9 inhibitor.       CAD/Status post coronary artery stent placement 12/2020    He continues to have anginal symptoms since stopping Imdur due to recurrent headaches.  He is now taking Ranexa.  Marland Kitchen He will need to continue Brilinta unopposed for 6 months .  continue lisinopril and carvedilol and asa.       History of acute cholecystitis    He has been referred to General Surgery for consideration of cholecystectomy  after he completes 6 months of DAPT.  However, His liver enzymes have normalized and he is regaining weight.       Other Visit Diagnoses     Chronic cholecystitis    -  Primary   Relevant Orders   Ambulatory referral to General Surgery       I spent 30 minutes dedicated to the care of this patient on the date of this encounter to include pre-visit review of patient's medical history,  most recent imaging studies, Face-to-face time with the patient , and post visit ordering of testing and therapeutics.    Follow-up: No follow-ups on file.   [name], MD

## 2021-02-20 NOTE — Telephone Encounter (Signed)
Have not received yet

## 2021-02-20 NOTE — Patient Instructions (Signed)
Increase your protonix to 2 times daily .  This will help reduce the chest pain IF IT'S DUE TO REFLUX  IF THE CHEST PAIN IS NOT RELIEVED WITH TYLENOL,  TAKE A NITROGLCYERIN.  THIS WILL Dilate the blood vessels in your heart and also treat esophageal spasm from reflux   I will make a surgery referral for your gall bladder.

## 2021-02-21 DIAGNOSIS — Z0279 Encounter for issue of other medical certificate: Secondary | ICD-10-CM

## 2021-02-21 NOTE — Telephone Encounter (Signed)
Received form and placed in red folder.

## 2021-02-22 ENCOUNTER — Encounter: Payer: Self-pay | Admitting: Internal Medicine

## 2021-02-22 NOTE — Assessment & Plan Note (Signed)
He is tolerating minimal dose of rosuvastatin.  If LDL is not  70 or less will need PCSK9 inhibitor.

## 2021-02-22 NOTE — Assessment & Plan Note (Signed)
He has been referred to General Surgery for consideration of cholecystectomy  after he completes 6 months of DAPT.  However, His liver enzymes have normalized and he is regaining weight.

## 2021-02-22 NOTE — Assessment & Plan Note (Signed)
Continue Crestor,  Started during hospitalization Will adjust dose for goal LDL > 70

## 2021-02-22 NOTE — Assessment & Plan Note (Signed)
He continues to have anginal symptoms since stopping Imdur due to recurrent headaches.  He is now taking Ranexa.  Marland Kitchen He will need to continue Brilinta unopposed for 6 months .  continue lisinopril and carvedilol and asa.

## 2021-02-22 NOTE — Assessment & Plan Note (Signed)
He is avoiding all NSAIDs and using tylenol only for pain . Reminded that his risk of GI  Bleed is high due to use of DAPT if he resumes NSAIDs

## 2021-02-24 ENCOUNTER — Other Ambulatory Visit (HOSPITAL_COMMUNITY): Payer: Self-pay

## 2021-02-24 NOTE — Telephone Encounter (Signed)
Pt called in about status of [employer] paperwork. Pt requesting callback

## 2021-02-25 ENCOUNTER — Other Ambulatory Visit: Payer: Self-pay

## 2021-02-25 MED ORDER — PANTOPRAZOLE SODIUM 40 MG PO TBEC: 40.0000 mg | DELAYED_RELEASE_TABLET | Freq: Two times a day (BID) | ORAL | 0 refills | Status: DC

## 2021-02-26 NOTE — Telephone Encounter (Signed)
Pt is aware that paperwork has been completed and faxed via mychart.

## 2021-02-27 ENCOUNTER — Other Ambulatory Visit: Payer: Self-pay

## 2021-02-27 ENCOUNTER — Telehealth: Payer: Self-pay | Admitting: Internal Medicine

## 2021-02-27 ENCOUNTER — Other Ambulatory Visit: Payer: Self-pay | Admitting: Internal Medicine

## 2021-02-27 ENCOUNTER — Encounter (HOSPITAL_COMMUNITY): Payer: Self-pay

## 2021-02-27 ENCOUNTER — Encounter (HOSPITAL_COMMUNITY)
Admission: RE | Admit: 2021-02-27 | Discharge: 2021-02-27 | Disposition: A | Discharge status: Home / Self Care | Payer: BC Managed Care – PPO | Source: Ambulatory Visit | Attending: Cardiology | Admitting: Cardiology

## 2021-02-27 VITALS — BP 116/68 | HR 79 | Ht 73.0 in | Wt 175.9 lb

## 2021-02-27 DIAGNOSIS — I214 Non-ST elevation (NSTEMI) myocardial infarction: Secondary | ICD-10-CM | POA: Insufficient documentation

## 2021-02-27 DIAGNOSIS — Z955 Presence of coronary angioplasty implant and graft: Secondary | ICD-10-CM | POA: Diagnosis not present

## 2021-02-27 LAB — GLUCOSE, CAPILLARY: Glucose-Capillary: 213 mg/dL — ABNORMAL HIGH (ref 70–99)

## 2021-02-27 NOTE — Progress Notes (Signed)
Cardiac Individual Treatment Plan  Patient Details  Name: Jacob Baker MRN: [id number] Date of Birth: 06/07/1970 Referring Provider:   Flowsheet Row CARDIAC REHAB PHASE II ORIENTATION from 02/27/2021 in [place]  Referring Provider Dr. Harl Bowie       Initial Encounter Date:  Flowsheet Row CARDIAC REHAB PHASE II ORIENTATION from 02/27/2021 in [place]  Date 02/27/21       Visit Diagnosis: NSTEMI (non-ST elevated myocardial infarction) [facility])  Status post coronary artery stent placement  Patient's Home Medications on Admission:  Current Outpatient Medications:    acetaminophen (TYLENOL) 325 MG tablet, Take 2 tablets (650 mg total) by mouth every 6 (six) hours as needed for mild pain or fever., Disp: 12 tablet, Rfl: 0   aspirin 81 MG EC tablet, Take 1 tablet (81 mg total) by mouth daily with breakfast. Swallow whole., Disp: 90 tablet, Rfl: 3   dapagliflozin propanediol (FARXIGA) 10 MG TABS tablet, Take 1 tablet (10 mg total) by mouth daily., Disp: 30 tablet, Rfl: 11   glipiZIDE (GLUCOTROL XL) 5 MG 24 hr tablet, Take 2 tablets (10 mg total) by mouth daily with breakfast., Disp: 180 tablet, Rfl: 1   lisinopril (ZESTRIL) 20 MG tablet, Take 1 tablet (20 mg total) by mouth daily., Disp: 30 tablet, Rfl: 11   metFORMIN (GLUCOPHAGE) 850 MG tablet, TAKE 1 TABLET(850 MG) BY MOUTH TWICE DAILY WITH A MEAL., Disp: 180 tablet, Rfl: 1   metoprolol tartrate (LOPRESSOR) 25 MG tablet, Take 1 tablet (25 mg total) by mouth 2 (two) times daily., Disp: 60 tablet, Rfl: 11   nitroGLYCERIN (NITROSTAT) 0.4 MG SL tablet, Place 1 tablet (0.4 mg total) under the tongue every 5 (five) minutes as needed for chest pain., Disp: 25 tablet, Rfl: 1   ondansetron (ZOFRAN) 4 MG tablet, Take 1 tablet (4 mg total) by mouth daily as needed for nausea or vomiting., Disp: 30 tablet, Rfl: 1   pantoprazole (PROTONIX) 40 MG tablet, Take 1 tablet (40 mg total) by mouth 2 (two) times  daily., Disp: 180 tablet, Rfl: 0   polyethylene glycol (MIRALAX) 17 g packet, Take 17 g by mouth daily. (Patient taking differently: Take 17 g by mouth daily as needed for moderate constipation.), Disp: 30 each, Rfl: 1   ranolazine (RANEXA) 500 MG 12 hr tablet, Take 1 tablet (500 mg total) by mouth 2 (two) times daily., Disp: 90 tablet, Rfl: 11   rosuvastatin (CRESTOR) 40 MG tablet, Take 40 mg by mouth daily., Disp: , Rfl:    ticagrelor (BRILINTA) 90 MG TABS tablet, Take 1 tablet (90 mg total) by mouth 2 (two) times daily., Disp: 60 tablet, Rfl: 11   vitamin B-12 (CYANOCOBALAMIN) 1000 MCG tablet, Take 1,000 mcg by mouth daily., Disp: , Rfl:    amLODipine (NORVASC) 10 MG tablet, TAKE 1 TABLET(10 MG) BY MOUTH DAILY, Disp: 90 tablet, Rfl: 1   glucose blood (ONETOUCH VERIO) test strip, Test sugars twice daily, Disp: 50 each, Rfl: 6   mirtazapine (REMERON) 7.5 MG tablet, Take 1 tablet (7.5 mg total) by mouth at bedtime. (Patient not taking: Reported on 02/26/2021), Disp: 90 tablet, Rfl: 1   ONETOUCH DELICA LANCETS 67T MISC, Test sugars twice daily, Disp: 50 each, Rfl: 3   oxyCODONE-acetaminophen (PERCOCET) 7.5-325 MG tablet, Take 1 tablet by mouth every 6 (six) hours as needed for moderate pain or severe pain. (Patient not taking: Reported on 02/26/2021), Disp: 12 tablet, Rfl: 0  Past Medical History: Past Medical History:  Diagnosis  Date   (HFpEF) heart failure with preserved ejection fraction ([place])    a. 12/2020 Echo: EF 65-70%, no rwma, mild LVH, nl RV fxn, triv MR.   CAD (coronary artery disease)    a. 12/2020 NSTEMI/PCI: LM nl, LAD min irregs, D1 99 (2.0x12 Onyx Frontier DES), LCX 85p (small), RCA 80p/m (4.0x18 Onyx Frontier DES - staged following day), 60d, RPDA mod dzs. EF 50-55%.   Diabetes mellitus without complication ([place])    DVT (deep venous thrombosis) ([place]) 04/2017   GERD (gastroesophageal reflux disease)    History of migraine    none in over 2 yrs   Hyperlipidemia    Hypertension     Syncope and collapse    x1 - approx 2-3 yrs ago - dehydration    Tobacco Use: Social History   Tobacco Use  Smoking Status Former   Packs/day: 2.00   Years: 27.00   Pack years: 54.00   Types: Cigarettes   Quit date: 12/30/2020   Years since quitting: 0.1  Smokeless Tobacco Never    Labs: Recent Review Flowsheet Data     Labs for ITP Cardiac and Pulmonary Rehab Latest Ref Rng & Units 11/16/2019 05/22/2020 08/21/2020 12/31/2020 01/01/2021   Cholestrol 0 - 200 mg/dL 186 - 192 - 248(H)   LDLCALC 0 - 99 mg/dL 127(H) - 133(H) - 178(H)   LDLDIRECT mg/dL - - - - -   HDL >40 mg/dL 30(L) - 28(L) - 30(L)   Trlycerides <150 mg/dL 175(H) - 170(H) - 202(H)   Hemoglobin A1c 4.8 - 5.6 % - 8.0(A) 6.6(H) 7.4(H) -   TCO2 22 - 32 mmol/L - - - - -       Capillary Blood Glucose: Lab Results  Component Value Date   GLUCAP 175 (H) 01/13/2021   GLUCAP 209 (H) 01/13/2021   GLUCAP 222 (H) 01/12/2021   GLUCAP 187 (H) 01/12/2021   GLUCAP 196 (H) 01/12/2021    POCT Glucose     Row Name 02/27/21 1459             POCT Blood Glucose   Pre-Exercise 213 mg/dL                Exercise Target Goals: Exercise Program Goal: Individual exercise prescription set using results from initial 6 min walk test and THRR while considering  patients activity barriers and safety.   Exercise Prescription Goal: Starting with aerobic activity 30 plus minutes a day, 3 days per week for initial exercise prescription. Provide home exercise prescription and guidelines that participant acknowledges understanding prior to discharge.  Activity Barriers & Risk Stratification:  Activity Barriers & Cardiac Risk Stratification - 02/27/21 1329       Activity Barriers & Cardiac Risk Stratification   Activity Barriers Joint Problems;Deconditioning;Shortness of Breath    Cardiac Risk Stratification High             6 Minute Walk:  6 Minute Walk     Row Name 02/27/21 1459         6 Minute Walk   Phase  Initial     Distance 1400 feet     Walk Time 6 minutes     # of Rest Breaks 0     MPH 2.65     METS 4.23     RPE 9     VO2 Peak 14.82     Symptoms No     Resting HR 79 bpm     Resting BP 116/68  Resting Oxygen Saturation  99 %     Exercise Oxygen Saturation  during 6 min walk 100 %     Max Ex. HR 82 bpm     Max Ex. BP 125/65     2 Minute Post BP 115/62              Oxygen Initial Assessment:   Oxygen Re-Evaluation:   Oxygen Discharge (Final Oxygen Re-Evaluation):   Initial Exercise Prescription:  Initial Exercise Prescription - 02/27/21 1500       Date of Initial Exercise RX and Referring Provider   Date 02/27/21    Referring Provider Dr. Harl Bowie    Expected Discharge Date 05/23/21      Treadmill   MPH 2    Grade 0    Minutes 17      Recumbant Elliptical   Level 1    RPM 60    Minutes 22      Prescription Details   Frequency (times per week) 3    Duration Progress to 30 minutes of continuous aerobic without signs/symptoms of physical distress      Intensity   THRR 40-80% of Max Heartrate 67-134    Ratings of Perceived Exertion 11-13    Perceived Dyspnea 0-4      Resistance Training   Training Prescription Yes    Weight 4 lbs    Reps 10-15             Perform Capillary Blood Glucose checks as needed.  Exercise Prescription Changes:   Exercise Comments:   Exercise Goals and Review:   Exercise Goals     Row Name 02/27/21 1502             Exercise Goals   Increase Physical Activity Yes       Intervention Provide advice, education, support and counseling about physical activity/exercise needs.;Develop an individualized exercise prescription for aerobic and resistive training based on initial evaluation findings, risk stratification, comorbidities and participant's personal goals.       Expected Outcomes Short Term: Attend rehab on a regular basis to increase amount of physical activity.;Long Term: Exercising regularly at least  3-5 days a week.;Long Term: Add in home exercise to make exercise part of routine and to increase amount of physical activity.       Increase Strength and Stamina Yes       Intervention Provide advice, education, support and counseling about physical activity/exercise needs.;Develop an individualized exercise prescription for aerobic and resistive training based on initial evaluation findings, risk stratification, comorbidities and participant's personal goals.       Expected Outcomes Short Term: Increase workloads from initial exercise prescription for resistance, speed, and METs.;Short Term: Perform resistance training exercises routinely during rehab and add in resistance training at home;Long Term: Improve cardiorespiratory fitness, muscular endurance and strength as measured by increased METs and functional capacity (6MWT)       Able to understand and use rate of perceived exertion (RPE) scale Yes       Intervention Provide education and explanation on how to use RPE scale       Expected Outcomes Short Term: Able to use RPE daily in rehab to express subjective intensity level;Long Term:  Able to use RPE to guide intensity level when exercising independently       Knowledge and understanding of Target Heart Rate Range (THRR) Yes       Intervention Provide education and explanation of THRR including how the numbers were predicted and  where they are located for reference       Expected Outcomes Short Term: Able to state/look up THRR;Long Term: Able to use THRR to govern intensity when exercising independently;Short Term: Able to use daily as guideline for intensity in rehab       Able to check pulse independently Yes       Intervention Provide education and demonstration on how to check pulse in carotid and radial arteries.;Review the importance of being able to check your own pulse for safety during independent exercise       Expected Outcomes Short Term: Able to explain why pulse checking is  important during independent exercise;Long Term: Able to check pulse independently and accurately       Understanding of Exercise Prescription Yes       Intervention Provide education, explanation, and written materials on patient's individual exercise prescription       Expected Outcomes Short Term: Able to explain program exercise prescription;Long Term: Able to explain home exercise prescription to exercise independently                Exercise Goals Re-Evaluation :    Discharge Exercise Prescription (Final Exercise Prescription Changes):   Nutrition:  Target Goals: Understanding of nutrition guidelines, daily intake of sodium 1500mg , cholesterol 200mg , calories 30% from fat and 7% or less from saturated fats, daily to have 5 or more servings of fruits and vegetables.  Biometrics:  Pre Biometrics - 02/27/21 1502       Pre Biometrics   Height 6\' 1"  (1.854 m)    Weight 175 lb 14.8 oz (79.8 kg)    Waist Circumference 36 inches    Hip Circumference 34 inches    Waist to Hip Ratio 1.06 %    BMI (Calculated) 23.22    Triceps Skinfold 11 mm    % Body Fat 21.9 %    Grip Strength 45 kg    Flexibility 0 in    Single Leg Stand 45 seconds              Nutrition Therapy Plan and Nutrition Goals:  Nutrition Therapy & Goals - 02/27/21 1336       Intervention Plan   Intervention Prescribe, educate and counsel regarding individualized specific dietary modifications aiming towards targeted core components such as weight, hypertension, lipid management, diabetes, heart failure and other comorbidities.;Nutrition handout(s) given to patient.    Expected Outcomes Short Term Goal: Understand basic principles of dietary content, such as calories, fat, sodium, cholesterol and nutrients.;Long Term Goal: Adherence to prescribed nutrition plan.             Nutrition Assessments:  Nutrition Assessments - 02/27/21 1336       MEDFICTS Scores   Pre Score 50             MEDIFICTS Score Key: ?70 Need to make dietary changes  40-70 Heart Healthy Diet ? 40 Therapeutic Level Cholesterol Diet   Picture Your Plate Scores: <27 Unhealthy dietary pattern with much room for improvement. 41-50 Dietary pattern unlikely to meet recommendations for good health and room for improvement. 51-60 More healthful dietary pattern, with some room for improvement.  >60 Healthy dietary pattern, although there may be some specific behaviors that could be improved.    Nutrition Goals Re-Evaluation:   Nutrition Goals Discharge (Final Nutrition Goals Re-Evaluation):   Psychosocial: Target Goals: Acknowledge presence or absence of significant depression and/or stress, maximize coping skills, provide positive support system. Participant is able to verbalize  types and ability to use techniques and skills needed for reducing stress and depression.  Initial Review & Psychosocial Screening:  Initial Psych Review & Screening - 02/27/21 1332       Initial Review   Current issues with None Identified      Family Dynamics   Good Support System? Yes    Comments His support system is his mom, siblings, wife, daughters, and friends.      Barriers   Psychosocial barriers to participate in program There are no identifiable barriers or psychosocial needs.      Screening Interventions   Interventions Encouraged to exercise             Quality of Life Scores:  Quality of Life - 02/27/21 1441       Quality of Life   Select Quality of Life      Quality of Life Scores   Health/Function Pre 21.2 %    Socioeconomic Pre 24.5 %    Psych/Spiritual Pre 28.93 %    Family Pre 24 %    GLOBAL Pre 23.9 %            Scores of 19 and below usually indicate a poorer quality of life in these areas.  A difference of  2-3 points is a clinically meaningful difference.  A difference of 2-3 points in the total score of the Quality of Life Index has been associated with significant  improvement in overall quality of life, self-image, physical symptoms, and general health in studies assessing change in quality of life.  PHQ-9: Recent Review Flowsheet Data     Depression screen [facility] 2/9 02/27/2021 02/20/2021 01/24/2021 10/21/2020 07/31/2020   Decreased Interest 0 0 3 0 0   Down, Depressed, Hopeless 0 0 1 0 0   PHQ - 2 Score 0 0 4 0 0   Altered sleeping 0 0 0 - -   Tired, decreased energy 0 0 3 - -   Change in appetite 0 0 3 - -   Feeling bad or failure about yourself  0 0 1 - -   Trouble concentrating 0 0 1 - -   Moving slowly or fidgety/restless 0 0 0 - -   Suicidal thoughts 0 0 0 - -   PHQ-9 Score 0 0 12 - -   Difficult doing work/chores Not difficult at all Not difficult at all Somewhat difficult - -      Interpretation of Total Score  Total Score Depression Severity:  1-4 = Minimal depression, 5-9 = Mild depression, 10-14 = Moderate depression, 15-19 = Moderately severe depression, 20-27 = Severe depression   Psychosocial Evaluation and Intervention:  Psychosocial Evaluation - 02/27/21 1442       Psychosocial Evaluation & Interventions   Interventions Stress management education;Relaxation education;Encouraged to exercise with the program and follow exercise prescription    Comments Pt has no identifiable psychosocial issues and no barriers to participating in rehab. He scored a 0 on his PHQ-9. 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 He is nervous about going back to work. He quit smoking on 12/30/2020 and reports he has had no issues with relapses. He reports that he wore a nicotine patch for one night, and has not had to wear them anymore, and that he has no other cravings. 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 He is  confident that he will be able to resist smoking when he goes back to driving. He also reports that his job contributed to his bad diet. He has been eating  better as of late to get his diabetes under control and to limit his symptoms from cholecystitis. He reports that he has a good support system with his siblins, his wife, his daughter, and friends. His goals while in the program are to increase his strength and stamina, eat better, and to ultimately be able to come off of some of his medications. He is eager to start the program.    Expected Outcomes Pt will continue to have no identifiable psychosocial issues.    Continue Psychosocial Services  No Follow up required             Psychosocial Re-Evaluation:   Psychosocial Discharge (Final Psychosocial Re-Evaluation):   Vocational Rehabilitation: Provide vocational rehab assistance to qualifying candidates.   Vocational Rehab Evaluation & Intervention:  Vocational Rehab - 02/27/21 1338       Initial Vocational Rehab Evaluation & Intervention   Assessment shows need for Vocational Rehabilitation No   He will be able to return to work without difficulty            Education: Education Goals: Education classes will be provided on a weekly basis, covering required topics. Participant will state understanding/return demonstration of topics presented.  Learning Barriers/Preferences:  Learning Barriers/Preferences - 02/27/21 1336       Learning Barriers/Preferences   Learning Barriers None    Learning Preferences Audio;Computer/Internet;Group Instruction;Individual Instruction;Pictoral;Skilled Demonstration;Verbal Instruction;Written Material;Video             Education Topics: Hypertension, Hypertension Reduction -Define heart disease and high blood pressure. Discus how high blood pressure affects the body and ways to reduce high blood pressure.   Exercise and Your Heart -Discuss why it is important to exercise, the FITT principles of exercise, normal and abnormal responses to exercise, and how to exercise safely.   Angina -Discuss definition of angina, causes of  angina, treatment of angina, and how to decrease risk of having angina.   Cardiac Medications -Review what the following cardiac medications are used for, how they affect the body, and side effects that may occur when taking the medications.  Medications include Aspirin, Beta blockers, calcium channel blockers, ACE Inhibitors, angiotensin receptor blockers, diuretics, digoxin, and antihyperlipidemics.   Congestive Heart Failure -Discuss the definition of CHF, how to live with CHF, the signs and symptoms of CHF, and how keep track of weight and sodium intake.   Heart Disease and Intimacy -Discus the effect sexual activity has on the heart, how changes occur during intimacy as we age, and safety during sexual activity.   Smoking Cessation / COPD -Discuss different methods to quit smoking, the health benefits of quitting smoking, and the definition of COPD.   Nutrition I: Fats -Discuss the types of cholesterol, what cholesterol does to the heart, and how cholesterol levels can be controlled.   Nutrition II: Labels -Discuss the different components of food labels and how to read food label   Heart Parts/Heart Disease and PAD -Discuss the anatomy of the heart, the pathway of blood circulation through the heart, and these are affected by heart disease.   Stress I: Signs and Symptoms -Discuss the causes of stress, how stress may lead to anxiety and depression, and ways to limit stress.   Stress II: Relaxation -Discuss different types of relaxation techniques to limit stress.   Warning Signs  of Stroke / TIA -Discuss definition of a stroke, what the signs and symptoms are of a stroke, and how to identify when someone is having stroke.   Knowledge Questionnaire Score:  Knowledge Questionnaire Score - 02/27/21 1337       Knowledge Questionnaire Score   Pre Score 20/24             Core Components/Risk Factors/Patient Goals at Admission:  Personal Goals and Risk Factors at  Admission - 02/27/21 1343       Core Components/Risk Factors/Patient Goals on Admission   Improve shortness of breath with ADL's Yes    Intervention Provide education, individualized exercise plan and daily activity instruction to help decrease symptoms of SOB with activities of daily living.    Expected Outcomes Short Term: Improve cardiorespiratory fitness to achieve a reduction of symptoms when performing ADLs;Long Term: Be able to perform more ADLs without symptoms or delay the onset of symptoms    Diabetes Yes    Intervention Provide education about signs/symptoms and action to take for hypo/hyperglycemia.;Provide education about proper nutrition, including hydration, and aerobic/resistive exercise prescription along with prescribed medications to achieve blood glucose in normal ranges: Fasting glucose 65-99 mg/dL    Expected Outcomes Short Term: Participant verbalizes understanding of the signs/symptoms and immediate care of hyper/hypoglycemia, proper foot care and importance of medication, aerobic/resistive exercise and nutrition plan for blood glucose control.;Long Term: Attainment of HbA1C < 7%.    Hypertension Yes    Intervention Provide education on lifestyle modifcations including regular physical activity/exercise, weight management, moderate sodium restriction and increased consumption of fresh fruit, vegetables, and low fat dairy, alcohol moderation, and smoking cessation.;Monitor prescription use compliance.    Expected Outcomes Short Term: Continued assessment and intervention until BP is < 140/37mm HG in hypertensive participants. < 130/61mm HG in hypertensive participants with diabetes, heart failure or chronic kidney disease.;Long Term: Maintenance of blood pressure at goal levels.    Lipids Yes    Intervention Provide education and support for participant on nutrition & aerobic/resistive exercise along with prescribed medications to achieve LDL 70mg , HDL >40mg .    Expected  Outcomes Short Term: Participant states understanding of desired cholesterol values and is compliant with medications prescribed. Participant is following exercise prescription and nutrition guidelines.;Long Term: Cholesterol controlled with medications as prescribed, with individualized exercise RX and with personalized nutrition plan. Value goals: LDL < 70mg , HDL > 40 mg.    Personal Goal Other Yes    Personal Goal Improve Strength and stamina    Intervention Attend cardiac rehab and begin home exercise    Expected Outcomes Pt will improve strength and stamina             Core Components/Risk Factors/Patient Goals Review:    Core Components/Risk Factors/Patient Goals at Discharge (Final Review):    ITP Comments:   Comments: Patient arrived for 1st visit/orientation/education at 1230. Patient was referred to CR by Dr. Harriet Masson due to NSTEMI (I21.4) and status post coronary artery stent placement (Z95.5). During orientation advised patient on arrival and appointment times what to wear, what to do before, during and after exercise. Reviewed attendance and class policy.  Pt is scheduled to return Cardiac Rehab on 03/03/21 at 1445. Pt was advised to come to class 15 minutes before class starts.  Discussed RPE/Dpysnea scales. Patient participated in warm up stretches. Patient was able to complete 6 minute walk test.  Telemetry: NSR. Patient was measured for the equipment. Discussed equipment safety with patient. Took patient pre-anthropometric  measurements. Patient finished visit at 1400.

## 2021-02-27 NOTE — Telephone Encounter (Signed)
Spoke with pt to let him know that the information was faxed today around lunch time. Pt gave a verbal understanding.

## 2021-02-27 NOTE — Telephone Encounter (Signed)
Patient called in stated that Dr Derrel Nip filled out Short Term Disability documentation and they are needing the visit summary sent to them. Patient just checking on the status if Dr Derrel Nip sent back summary, Please Call patient @ [phone]. Patient stated very important to get summary sent in been out of work since 12/30/20. Truing to get some funds coming in.

## 2021-03-03 ENCOUNTER — Encounter (HOSPITAL_COMMUNITY)
Admission: RE | Admit: 2021-03-03 | Discharge: 2021-03-03 | Disposition: A | Discharge status: Home / Self Care | Payer: BC Managed Care – PPO | Source: Ambulatory Visit | Attending: Cardiology | Admitting: Cardiology

## 2021-03-03 DIAGNOSIS — Z955 Presence of coronary angioplasty implant and graft: Secondary | ICD-10-CM

## 2021-03-03 DIAGNOSIS — I214 Non-ST elevation (NSTEMI) myocardial infarction: Secondary | ICD-10-CM

## 2021-03-03 NOTE — Progress Notes (Signed)
Daily Session Note  Patient Details  Name: Jacob Baker MRN: [id number] Date of Birth: [birth year] Referring Provider:   Flowsheet Row CARDIAC REHAB PHASE II ORIENTATION from 02/27/2021 in [place]  Referring Provider Dr. Harl Bowie       Encounter Date: 03/03/2021  Check In:  Session Check In - 03/03/21 1445       Check-In   Supervising physician immediately available to respond to emergencies [facility] MD immediately available    Physician(s) Dr. Audie Box    Location [facility]    Staff Present Hoy Register, MS, ACSM-CEP, Exercise Physiologist;Chiquitta Matty Zigmund Daniel, Exercise Physiologist;Debra Wynetta Emery, RN, BSN    Virtual Visit No    Medication changes reported     No    Fall or balance concerns reported    No    Tobacco Cessation No Change    Warm-up and Cool-down Performed as group-led instruction    Resistance Training Performed No    VAD Patient? No    PAD/SET Patient? No      Pain Assessment   Currently in Pain? No/denies    Multiple Pain Sites No             Capillary Blood Glucose: No results found for this or any previous visit (from the past 24 hour(s)).    Social History   Tobacco Use  Smoking Status Former   Packs/day: 2.00   Years: 27.00   Pack years: 54.00   Types: Cigarettes   Quit date: 12/30/2020   Years since quitting: 0.1  Smokeless Tobacco Never    Goals Met:  Independence with exercise equipment Exercise tolerated well No report of concerns or symptoms today Strength training completed today  Goals Unmet:  Not Applicable  Comments: check out 1545   Dr. [name] is Medical Director for [place]

## 2021-03-05 ENCOUNTER — Encounter (HOSPITAL_COMMUNITY): Payer: BC Managed Care – PPO

## 2021-03-06 ENCOUNTER — Encounter (HOSPITAL_COMMUNITY): Payer: Self-pay | Admitting: Occupational Therapy

## 2021-03-06 NOTE — Therapy (Signed)
[place] °[facility] °[address] °[place], [place], [address] °Phone: [phone]   Fax:  [phone] ° °Patient Details  °Name: Jacob Baker °MRN: [id number] °Date of Birth: 09/22/1970 °Referring Provider:  No ref. provider found ° °Encounter Date: 03/06/2021 ° °OCCUPATIONAL THERAPY DISCHARGE SUMMARY ° °Visits from Start of Care: 2 ° °Current functional level related to goals / functional outcomes: °Unknown. Pt has not returned since last visit on 12/24/20.  °  °Remaining deficits: °unknown °  °Education / Equipment: °HEP  ° °Patient agrees to discharge. Patient goals were not met. Patient is being discharged due to not returning since the last visit.. ° ° ° ° ° ° , OTR/L  °[phone] °03/06/2021, 3:50 PM ° °[place] °[facility] °[address] °[place], [place], [address] °Phone: [phone]   Fax:  [phone] °

## 2021-03-07 ENCOUNTER — Encounter (HOSPITAL_COMMUNITY)
Admission: RE | Admit: 2021-03-07 | Discharge: 2021-03-07 | Disposition: A | Discharge status: Home / Self Care | Payer: BC Managed Care – PPO | Source: Ambulatory Visit | Attending: Cardiology | Admitting: Cardiology

## 2021-03-07 DIAGNOSIS — Z955 Presence of coronary angioplasty implant and graft: Secondary | ICD-10-CM | POA: Diagnosis not present

## 2021-03-07 DIAGNOSIS — I214 Non-ST elevation (NSTEMI) myocardial infarction: Secondary | ICD-10-CM | POA: Diagnosis not present

## 2021-03-07 LAB — GLUCOSE, CAPILLARY: Glucose-Capillary: 111 mg/dL — ABNORMAL HIGH (ref 70–99)

## 2021-03-07 NOTE — Progress Notes (Signed)
Daily Session Note  Patient Details  Name: Jacob Baker MRN: [id number] Date of Birth: 03/22/1970 Referring Provider:   Flowsheet Row CARDIAC REHAB PHASE II ORIENTATION from 02/27/2021 in [place]  Referring Provider Dr. Harl Bowie       Encounter Date: 03/07/2021  Check In:  Session Check In - 03/07/21 1445       Check-In   Supervising physician immediately available to respond to emergencies [facility] MD immediately available    Physician(s) Dr Marlou Porch    Location [facility]    Staff Present Aundra Dubin, RN, Madlyn Frankel, RN, Bjorn Loser, MS, ACSM-CEP, Exercise Physiologist;Heather Zigmund Apollonia Amini, Exercise Physiologist    Virtual Visit No    Medication changes reported     No    Fall or balance concerns reported    No    Tobacco Cessation No Change    Warm-up and Cool-down Performed as group-led instruction    Resistance Training Performed No    VAD Patient? No    PAD/SET Patient? No      Pain Assessment   Currently in Pain? No/denies    Multiple Pain Sites No             Capillary Blood Glucose: Results for orders placed or performed during the hospital encounter of 03/07/21 (from the past 24 hour(s))  Glucose, capillary     Status: Abnormal   Collection Time: 03/07/21  2:35 PM  Result Value Ref Range   Glucose-Capillary 111 (H) 70 - 99 mg/dL      Social History   Tobacco Use  Smoking Status Former   Packs/day: 2.00   Years: 27.00   Pack years: 54.00   Types: Cigarettes   Quit date: 12/30/2020   Years since quitting: 0.1  Smokeless Tobacco Never    Goals Met:  Independence with exercise equipment Exercise tolerated well No report of concerns or symptoms today  Goals Unmet:  Not Applicable  Comments: checkout 1545    Dr. [name] is Medical Director for [place]

## 2021-03-10 ENCOUNTER — Encounter (HOSPITAL_COMMUNITY)
Admission: RE | Admit: 2021-03-10 | Discharge: 2021-03-10 | Disposition: A | Discharge status: Home / Self Care | Payer: BC Managed Care – PPO | Source: Ambulatory Visit | Attending: Cardiology | Admitting: Cardiology

## 2021-03-12 ENCOUNTER — Encounter (HOSPITAL_COMMUNITY): Payer: BC Managed Care – PPO

## 2021-03-14 ENCOUNTER — Encounter (HOSPITAL_COMMUNITY): Payer: BC Managed Care – PPO

## 2021-03-14 ENCOUNTER — Encounter: Payer: Self-pay | Admitting: Internal Medicine

## 2021-03-17 ENCOUNTER — Encounter (HOSPITAL_COMMUNITY): Payer: BC Managed Care – PPO

## 2021-03-19 ENCOUNTER — Encounter (HOSPITAL_COMMUNITY): Payer: BC Managed Care – PPO

## 2021-03-20 NOTE — Progress Notes (Signed)
Discharge Progress Report  Patient Details  Name: Jacob Baker MRN: [id number] Date of Birth: 02-21-1970 Referring Provider:   Flowsheet Row CARDIAC REHAB PHASE II ORIENTATION from 02/27/2021 in [place]  Referring Provider Dr. Harl Bowie        Number of Visits: 3  Reason for Discharge:  Early Exit:  Lack of attendance  Smoking History:  Social History   Tobacco Use  Smoking Status Former   Packs/day: 2.00   Years: 27.00   Pack years: 54.00   Types: Cigarettes   Quit date: 12/30/2020   Years since quitting: 0.2  Smokeless Tobacco Never    Diagnosis:  NSTEMI (non-ST elevated myocardial infarction) ([place])  Status post coronary artery stent placement  ADL [facility]:   Initial Exercise Prescription:  Initial Exercise Prescription - 02/27/21 1500       Date of Initial Exercise RX and Referring Provider   Date 02/27/21    Referring Provider Dr. Harl Bowie    Expected Discharge Date 05/23/21      Treadmill   MPH 2    Grade 0    Minutes 17      Recumbant Elliptical   Level 1    RPM 60    Minutes 22      Prescription Details   Frequency (times per week) 3    Duration Progress to 30 minutes of continuous aerobic without signs/symptoms of physical distress      Intensity   THRR 40-80% of Max Heartrate 67-134    Ratings of Perceived Exertion 11-13    Perceived Dyspnea 0-4      Resistance Training   Training Prescription Yes    Weight 4 lbs    Reps 10-15             Discharge Exercise Prescription (Final Exercise Prescription Changes):  Exercise Prescription Changes - 03/07/21 1550       Response to Exercise   Blood Pressure (Admit) 122/62    Blood Pressure (Exercise) 140/70    Blood Pressure (Exit) 120/65    Heart Rate (Admit) 95 bpm    Heart Rate (Exercise) 129 bpm    Heart Rate (Exit) 104 bpm    Rating of Perceived Exertion (Exercise) 12    Duration Continue with 30 min of aerobic exercise without signs/symptoms of physical  distress.    Intensity THRR unchanged      Progression   Progression Continue to progress workloads to maintain intensity without signs/symptoms of physical distress.      Resistance Training   Training Prescription Yes    Weight 5    Reps 10-15    Time 10 Minutes      Treadmill   MPH 2.3    Grade 0    Minutes 17    METs 2.76      Recumbant Elliptical   Level 1    RPM 63    Minutes 22    METs 4.2             Functional Capacity:  6 Minute Walk     Row Name 02/27/21 1459         6 Minute Walk   Phase Initial     Distance 1400 feet     Walk Time 6 minutes     # of Rest Breaks 0     MPH 2.65     METS 4.23     RPE 9     VO2 Peak 14.82  Symptoms No     Resting HR 79 bpm     Resting BP 116/68     Resting Oxygen Saturation  99 %     Exercise Oxygen Saturation  during 6 min walk 100 %     Max Ex. HR 82 bpm     Max Ex. BP 125/65     2 Minute Post BP 115/62              Psychological, QOL, Others - Outcomes: PHQ 2/9: Depression screen [facility] 2/9 02/27/2021 02/20/2021 01/24/2021 10/21/2020 07/31/2020  Decreased Interest 0 0 3 0 0  Down, Depressed, Hopeless 0 0 1 0 0  PHQ - 2 Score 0 0 4 0 0  Altered sleeping 0 0 0 - -  Tired, decreased energy 0 0 3 - -  Change in appetite 0 0 3 - -  Feeling bad or failure about yourself  0 0 1 - -  Trouble concentrating 0 0 1 - -  Moving slowly or fidgety/restless 0 0 0 - -  Suicidal thoughts 0 0 0 - -  PHQ-9 Score 0 0 12 - -  Difficult doing work/chores Not difficult at all Not difficult at all Somewhat difficult - -  Some recent data might be hidden    Quality of Life:  Quality of Life - 02/27/21 1441       Quality of Life   Select Quality of Life      Quality of Life Scores   Health/Function Pre 21.2 %    Socioeconomic Pre 24.5 %    Psych/Spiritual Pre 28.93 %    Family Pre 24 %    GLOBAL Pre 23.9 %             Personal Goals: Goals established at orientation with interventions provided to work  toward goal.  Personal Goals and Risk Factors at Admission - 02/27/21 1343       Core Components/Risk Factors/Patient Goals on Admission   Improve shortness of breath with ADL's Yes    Intervention Provide education, individualized exercise plan and daily activity instruction to help decrease symptoms of SOB with activities of daily living.    Expected Outcomes Short Term: Improve cardiorespiratory fitness to achieve a reduction of symptoms when performing ADLs;Long Term: Be able to perform more ADLs without symptoms or delay the onset of symptoms    Diabetes Yes    Intervention Provide education about signs/symptoms and action to take for hypo/hyperglycemia.;Provide education about proper nutrition, including hydration, and aerobic/resistive exercise prescription along with prescribed medications to achieve blood glucose in normal ranges: Fasting glucose 65-99 mg/dL    Expected Outcomes Short Term: Participant verbalizes understanding of the signs/symptoms and immediate care of hyper/hypoglycemia, proper foot care and importance of medication, aerobic/resistive exercise and nutrition plan for blood glucose control.;Long Term: Attainment of HbA1C < 7%.    Hypertension Yes    Intervention Provide education on lifestyle modifcations including regular physical activity/exercise, weight management, moderate sodium restriction and increased consumption of fresh fruit, vegetables, and low fat dairy, alcohol moderation, and smoking cessation.;Monitor prescription use compliance.    Expected Outcomes Short Term: Continued assessment and intervention until BP is < 140/30mm HG in hypertensive participants. < 130/34mm HG in hypertensive participants with diabetes, heart failure or chronic kidney disease.;Long Term: Maintenance of blood pressure at goal levels.    Lipids Yes    Intervention Provide education and support for participant on nutrition & aerobic/resistive exercise along with prescribed medications to  achieve LDL 70mg , HDL >40mg .    Expected Outcomes Short Term: Participant states understanding of desired cholesterol values and is compliant with medications prescribed. Participant is following exercise prescription and nutrition guidelines.;Long Term: Cholesterol controlled with medications as prescribed, with individualized exercise RX and with personalized nutrition plan. Value goals: LDL < 70mg , HDL > 40 mg.    Personal Goal Other Yes    Personal Goal Improve Strength and stamina    Intervention Attend cardiac rehab and begin home exercise    Expected Outcomes Pt will improve strength and stamina              Personal Goals Discharge:  Goals and Risk Factor Review     Row Name 03/19/21 0904             Core Components/Risk Factors/Patient Goals Review   Personal Goals Review Weight Management/Obesity;Hypertension;Diabetes;Other       Review Patient was referred to [facility] with NSTEMI. He has completed 3 sessions last here 2/10. We have mailed a letter of discharge.       Expected Outcomes Patient will be discharged from the program if he does not respond to letter mailed for lack of attendance.                Exercise Goals and Review:  Exercise Goals     Row Name 02/27/21 1502             Exercise Goals   Increase Physical Activity Yes       Intervention Provide advice, education, support and counseling about physical activity/exercise needs.;Develop an individualized exercise prescription for aerobic and resistive training based on initial evaluation findings, risk stratification, comorbidities and participant's personal goals.       Expected Outcomes Short Term: Attend rehab on a regular basis to increase amount of physical activity.;Long Term: Exercising regularly at least 3-5 days a week.;Long Term: Add in home exercise to make exercise part of routine and to increase amount of physical activity.       Increase Strength and Stamina Yes       Intervention Provide  advice, education, support and counseling about physical activity/exercise needs.;Develop an individualized exercise prescription for aerobic and resistive training based on initial evaluation findings, risk stratification, comorbidities and participant's personal goals.       Expected Outcomes Short Term: Increase workloads from initial exercise prescription for resistance, speed, and METs.;Short Term: Perform resistance training exercises routinely during rehab and add in resistance training at home;Long Term: Improve cardiorespiratory fitness, muscular endurance and strength as measured by increased METs and functional capacity (6MWT)       Able to understand and use rate of perceived exertion (RPE) scale Yes       Intervention Provide education and explanation on how to use RPE scale       Expected Outcomes Short Term: Able to use RPE daily in rehab to express subjective intensity level;Long Term:  Able to use RPE to guide intensity level when exercising independently       Knowledge and understanding of Target Heart Rate Range (THRR) Yes       Intervention Provide education and explanation of THRR including how the numbers were predicted and where they are located for reference       Expected Outcomes Short Term: Able to state/look up THRR;Long Term: Able to use THRR to govern intensity when exercising independently;Short Term: Able to use daily as guideline for intensity in rehab  Able to check pulse independently Yes       Intervention Provide education and demonstration on how to check pulse in carotid and radial arteries.;Review the importance of being able to check your own pulse for safety during independent exercise       Expected Outcomes Short Term: Able to explain why pulse checking is important during independent exercise;Long Term: Able to check pulse independently and accurately       Understanding of Exercise Prescription Yes       Intervention Provide education, explanation, and  written materials on patient's individual exercise prescription       Expected Outcomes Short Term: Able to explain program exercise prescription;Long Term: Able to explain home exercise prescription to exercise independently                Exercise Goals Re-Evaluation:   Nutrition & Weight - Outcomes:  Pre Biometrics - 02/27/21 1502       Pre Biometrics   Height 6\' 1"  (1.854 m)    Weight 175 lb 14.8 oz (79.8 kg)    Waist Circumference 36 inches    Hip Circumference 34 inches    Waist to Hip Ratio 1.06 %    BMI (Calculated) 23.22    Triceps Skinfold 11 mm    % Body Fat 21.9 %    Grip Strength 45 kg    Flexibility 0 in    Single Leg Stand 45 seconds              Nutrition:  Nutrition Therapy & Goals - 02/27/21 1336       Intervention Plan   Intervention Prescribe, educate and counsel regarding individualized specific dietary modifications aiming towards targeted core components such as weight, hypertension, lipid management, diabetes, heart failure and other comorbidities.;Nutrition handout(s) given to patient.    Expected Outcomes Short Term Goal: Understand basic principles of dietary content, such as calories, fat, sodium, cholesterol and nutrients.;Long Term Goal: Adherence to prescribed nutrition plan.             Nutrition Discharge:  Nutrition Assessments - 02/27/21 1336       MEDFICTS Scores   Pre Score 50             Education Questionnaire Score:  Knowledge Questionnaire Score - 02/27/21 1337       Knowledge Questionnaire Score   Pre Score 20/24              Pt discharged from [facility] after 3 sessions. Pt has no showed for his past five sessions. He has been mailed a [employer].

## 2021-03-21 ENCOUNTER — Encounter (HOSPITAL_COMMUNITY): Payer: BC Managed Care – PPO

## 2021-03-24 ENCOUNTER — Encounter (HOSPITAL_COMMUNITY): Payer: BC Managed Care – PPO

## 2021-03-26 ENCOUNTER — Encounter (HOSPITAL_COMMUNITY): Payer: BC Managed Care – PPO

## 2021-03-28 ENCOUNTER — Other Ambulatory Visit (HOSPITAL_COMMUNITY)
Admission: RE | Admit: 2021-03-28 | Discharge: 2021-03-28 | Disposition: A | Discharge status: Home / Self Care | Payer: BC Managed Care – PPO | Source: Ambulatory Visit | Attending: Medical | Admitting: Medical

## 2021-03-28 ENCOUNTER — Encounter (HOSPITAL_COMMUNITY): Payer: BC Managed Care – PPO

## 2021-03-28 ENCOUNTER — Other Ambulatory Visit: Payer: Self-pay | Admitting: Internal Medicine

## 2021-03-28 ENCOUNTER — Other Ambulatory Visit (HOSPITAL_COMMUNITY)
Admission: RE | Admit: 2021-03-28 | Discharge: 2021-03-28 | Disposition: A | Discharge status: Home / Self Care | Payer: BC Managed Care – PPO | Source: Ambulatory Visit | Attending: Internal Medicine | Admitting: Internal Medicine

## 2021-03-28 DIAGNOSIS — E1169 Type 2 diabetes mellitus with other specified complication: Secondary | ICD-10-CM | POA: Diagnosis not present

## 2021-03-28 DIAGNOSIS — E782 Mixed hyperlipidemia: Secondary | ICD-10-CM | POA: Diagnosis not present

## 2021-03-28 DIAGNOSIS — E785 Hyperlipidemia, unspecified: Secondary | ICD-10-CM | POA: Diagnosis not present

## 2021-03-28 LAB — HEPATIC FUNCTION PANEL
ALT: 13 U/L (ref 0–44)
AST: 11 U/L — ABNORMAL LOW (ref 15–41)
Albumin: 4.4 g/dL (ref 3.5–5.0)
Alkaline Phosphatase: 68 U/L (ref 38–126)
Bilirubin, Direct: <0.1 mg/dL (ref 0.0–0.2)
Total Bilirubin: 0.6 mg/dL (ref 0.3–1.2)
Total Protein: 7.2 g/dL (ref 6.5–8.1)

## 2021-03-28 LAB — LIPID PANEL
Cholesterol: 123 mg/dL (ref 0–200)
HDL: 29 mg/dL — ABNORMAL LOW (ref >40)
LDL Cholesterol: 63 mg/dL (ref 0–99)
Total CHOL/HDL Ratio: 4.2 RATIO
Triglycerides: 155 mg/dL — ABNORMAL HIGH (ref <150)
VLDL: 31 mg/dL (ref 0–40)

## 2021-03-28 LAB — HEMOGLOBIN A1C
Hgb A1c MFr Bld: 4.9 % (ref 4.8–5.6)
Mean Plasma Glucose: 93.93 mg/dL

## 2021-03-31 ENCOUNTER — Encounter (HOSPITAL_COMMUNITY): Payer: BC Managed Care – PPO

## 2021-04-02 ENCOUNTER — Encounter (HOSPITAL_COMMUNITY): Payer: BC Managed Care – PPO

## 2021-04-04 ENCOUNTER — Encounter (HOSPITAL_COMMUNITY): Payer: BC Managed Care – PPO

## 2021-04-07 ENCOUNTER — Encounter (HOSPITAL_COMMUNITY): Payer: BC Managed Care – PPO

## 2021-04-09 ENCOUNTER — Encounter (HOSPITAL_COMMUNITY): Payer: BC Managed Care – PPO

## 2021-04-11 ENCOUNTER — Encounter (HOSPITAL_COMMUNITY): Payer: BC Managed Care – PPO

## 2021-04-14 ENCOUNTER — Encounter (HOSPITAL_COMMUNITY): Payer: BC Managed Care – PPO

## 2021-04-15 ENCOUNTER — Encounter: Payer: Self-pay | Admitting: Internal Medicine

## 2021-04-15 ENCOUNTER — Telehealth (INDEPENDENT_AMBULATORY_CARE_PROVIDER_SITE_OTHER): Payer: BC Managed Care – PPO | Admitting: Adult Health

## 2021-04-15 ENCOUNTER — Encounter: Payer: Self-pay | Admitting: Adult Health

## 2021-04-15 DIAGNOSIS — Z7901 Long term (current) use of anticoagulants: Secondary | ICD-10-CM | POA: Diagnosis not present

## 2021-04-15 DIAGNOSIS — H9201 Otalgia, right ear: Secondary | ICD-10-CM | POA: Diagnosis not present

## 2021-04-15 DIAGNOSIS — J3489 Other specified disorders of nose and nasal sinuses: Secondary | ICD-10-CM

## 2021-04-15 DIAGNOSIS — J0140 Acute pansinusitis, unspecified: Secondary | ICD-10-CM | POA: Diagnosis not present

## 2021-04-15 MED ORDER — FLUTICASONE PROPIONATE 50 MCG/ACT NA SUSP: 2.0000 | Freq: Every day | NASAL | 1 refills | Status: DC

## 2021-04-15 MED ORDER — AMOXICILLIN-POT CLAVULANATE 875-125 MG PO TABS: 1.0000 | ORAL_TABLET | Freq: Two times a day (BID) | ORAL | 0 refills | Status: DC

## 2021-04-15 NOTE — Patient Instructions (Signed)
Advised in person evaluation at anytime is advised if any symptoms do not improve, worsen or change at any given time.  ?Red Flags discussed. The patient was given clear instructions to go to ER or return to medical center if any red flags develop, symptoms do not improve, worsen or new problems develop. They verbalized understanding. ? ?Labs for [place] ordered.  ? ? ?Sinusitis, Adult ?Sinusitis is inflammation of your sinuses. Sinuses are hollow spaces in the bones around your face. Your sinuses are located: ?Around your eyes. ?In the middle of your forehead. ?Behind your nose. ?In your cheekbones. ?Mucus normally drains out of your sinuses. When your nasal tissues become inflamed or swollen, mucus can become trapped or blocked. This allows bacteria, viruses, and fungi to grow, which leads to infection. Most infections of the sinuses are caused by a virus. ?Sinusitis can develop quickly. It can last for up to 4 weeks (acute) or for more than 12 weeks (chronic). Sinusitis often develops after a cold. ?What are the causes? ?This condition is caused by anything that creates swelling in the sinuses or stops mucus from draining. This includes: ?Allergies. ?Asthma. ?Infection from bacteria or viruses. ?Deformities or blockages in your nose or sinuses. ?Abnormal growths in the nose (nasal polyps). ?Pollutants, such as chemicals or irritants in the air. ?Infection from fungi (rare). ?What increases the risk? ?You are more likely to develop this condition if you: ?Have a weak body defense system (immune system). ?Do a lot of swimming or diving. ?Overuse nasal sprays. ?Smoke. ?What are the signs or symptoms? ?The main symptoms of this condition are pain and a feeling of pressure around the affected sinuses. Other symptoms include: ?Stuffy nose or congestion. ?Thick drainage from your nose. ?Swelling and warmth over the affected sinuses. ?Headache. ?Upper toothache. ?A cough that may get worse at night. ?Extra mucus that  collects in the throat or the back of the nose (postnasal drip). ?Decreased sense of smell and taste. ?Fatigue. ?A fever. ?Sore throat. ?Bad breath. ?How is this diagnosed? ?This condition is diagnosed based on: ?Your symptoms. ?Your medical history. ?A physical exam. ?Tests to find out if your condition is acute or chronic. This may include: ?Checking your nose for nasal polyps. ?Viewing your sinuses using a device that has a light (endoscope). ?Testing for allergies or bacteria. ?Imaging tests, such as an MRI or CT scan. ?In rare cases, a bone biopsy may be done to rule out more serious types of fungal sinus disease. ?How is this treated? ?Treatment for sinusitis depends on the cause and whether your condition is chronic or acute. ?If caused by a virus, your symptoms should go away on their own within 10 days. You may be given medicines to relieve symptoms. They include: ?Medicines that shrink swollen nasal passages (topical intranasal decongestants). ?Medicines that treat allergies (antihistamines). ?A spray that eases inflammation of the nostrils (topical intranasal corticosteroids). ?Rinses that help get rid of thick mucus in your nose (nasal saline washes). ?If caused by bacteria, your health care provider may recommend waiting to see if your symptoms improve. Most bacterial infections will get better without antibiotic medicine. You may be given antibiotics if you have: ?A severe infection. ?A weak immune system. ?If caused by narrow nasal passages or nasal polyps, you may need to have surgery. ?Follow these instructions at home: ?Medicines ?Take, use, or apply over-the-counter and prescription medicines only as told by your health care provider. These may include nasal sprays. ?If you were prescribed an antibiotic  medicine, take it as told by your health care provider. Do not stop taking the antibiotic even if you start to feel better. ?Hydrate and humidify ? ?Drink enough fluid to keep your urine pale  yellow. Staying hydrated will help to thin your mucus. ?Use a cool mist humidifier to keep the humidity level in your home above 50%. ?Inhale steam for 10-15 minutes, 3-4 times a day, or as told by your health care provider. You can do this in the bathroom while a hot shower is running. ?Limit your exposure to cool or dry air. ?Rest ?Rest as much as possible. ?Sleep with your head raised (elevated). ?Make sure you get enough sleep each night. ?General instructions ? ?Apply a warm, moist washcloth to your face 3-4 times a day or as told by your health care provider. This will help with discomfort. ?Wash your hands often with soap and water to reduce your exposure to germs. If soap and water are not available, use hand sanitizer. ?Do not smoke. Avoid being around people who are smoking (secondhand smoke). ?Keep all follow-up visits as told by your health care provider. This is important. ?Contact a health care provider if: ?You have a fever. ?Your symptoms get worse. ?Your symptoms do not improve within 10 days. ?Get help right away if: ?You have a severe headache. ?You have persistent vomiting. ?You have severe pain or swelling around your face or eyes. ?You have vision problems. ?You develop confusion. ?Your neck is stiff. ?You have trouble breathing. ?Summary ?Sinusitis is soreness and inflammation of your sinuses. Sinuses are hollow spaces in the bones around your face. ?This condition is caused by nasal tissues that become inflamed or swollen. The swelling traps or blocks the flow of mucus. This allows bacteria, viruses, and fungi to grow, which leads to infection. ?If you were prescribed an antibiotic medicine, take it as told by your health care provider. Do not stop taking the antibiotic even if you start to feel better. ?Keep all follow-up visits as told by your health care provider. This is important. ?This information is not intended to replace advice given to you by your health care provider. Make sure you  discuss any questions you have with your health care provider. ?Document Revised: 06/14/2017 Document Reviewed: 06/14/2017 ?Elsevier Patient Education ? [employer]. ? ?

## 2021-04-15 NOTE — Progress Notes (Signed)
Telephone Visit via Note ? ?I connected with Jacob Baker on 04/15/21 at  4:30 PM EDT by a video enabled telemedicine application and verified that I am speaking with the correct person using two identifiers. ? ?Location: ?Patient: at home  ?Provider: Provider: Provider's office at  [facility], [place]. ?  ? ?  ?I discussed the limitations of evaluation and management by telemedicine and the availability of in person appointments. The patient expressed understanding and agreed to proceed. ? ?History of Present Illness: ?Patient is a 51 year old male in no acute distress who presents for a video visit for concerns for nasal congestion, pressure.  ?Reviewed Mychart message. He reports he has sinus congestion mixed with snot, he reports. Onset one over one week ago.  ?Ear pain right. Yellow nasal congestion. Denies any metallic taste or nasal dripping. Denies any drainage from ear. Denies any seasonal allergies normally. ?Right side of face and right nasal pressure as well. He reports his left side of face is normal. Nasal congestion worse in the morning. Denies any injury. ?He has no erythema on face he reports or warmth.  ?Denies any hemoptysis, denies any facial drooping, slurred speech or headache.  ?Patient  denies any fever, body aches,chills, rash, chest pain, shortness of breath, nausea, vomiting, or diarrhea.  ?Denies any injury or trauma.  ?Denies any head injury.  ?Denies dizziness, lightheadedness, pre syncopal or syncopal episodes.  ? ?On Brilinta for history of DVT. ?No Known Allergies ? ?  has a past medical history of (HFpEF) heart failure with preserved ejection fraction ([name]), CAD (coronary artery disease), Diabetes mellitus without complication ([name]), DVT (deep venous thrombosis) ([name]) (04/2017), GERD (gastroesophageal reflux disease), History of migraine, Hyperlipidemia, Hypertension, and Syncope and collapse.  ? ? ?Observations/Objective: ? ?Patient is alert and  oriented and responsive to questions Engages in conversation with provider. Speaks in full sentences without any pauses without any shortness of breath or distress.   ? ?Assessment and Plan: ?Acute non-recurrent pansinusitis - Plan: amoxicillin-clavulanate (AUGMENTIN) 875-125 MG tablet, CBC with Differential/Platelet, Comprehensive metabolic panel, Protime-INR, PTT ? ?Sinus pressure ? ?On anticoagulant therapy - Plan: Protime-INR, PTT ? ?Right ear pain ? ?Meds ordered this encounter  ?Medications  ? amoxicillin-clavulanate (AUGMENTIN) 875-125 MG tablet  ?  Sig: Take 1 tablet by mouth 2 (two) times daily.  ?  Dispense:  20 tablet  ?  Refill:  0  ? fluticasone (FLONASE) 50 MCG/ACT nasal spray  ?  Sig: Place 2 sprays into both nostrils daily.  ?  Dispense:  16 g  ?  Refill:  1  ?  ? ?Orders Placed This Encounter  ?Procedures  ? CBC with Differential/Platelet  ? Comprehensive metabolic panel  ? Protime-INR  ? PTT  ?  ?Recommend he schedule an appointment for follow up given he is on anticoagulation.  ?Suspect sinusitis, however red flags and strict return to office for in person and or ER is advised. Patient verbalized understanding of all instructions given and denies any further questions at this time.  ? ? ?Follow Up Instructions: ?Advised in person evaluation at anytime is advised if any symptoms do not improve, worsen or change at any given time.  ?Red Flags discussed. The patient was given clear instructions to go to ER or return to medical center if any red flags develop, symptoms do not improve, worsen or new problems develop. They verbalized understanding.  ?  ?I discussed the assessment and treatment plan with the patient.  The patient was provided an opportunity to ask questions and all were answered. The patient agreed with the plan and demonstrated an understanding of the instructions. ?  ?The patient was advised to call back or seek an in-person evaluation if the symptoms worsen or if the condition fails to  improve as anticipated. ? ?I provided 20 minutes of non-face-to-face time during this encounter. ? ? ?Marcille Buffy, FNP  ?

## 2021-04-16 ENCOUNTER — Telehealth: Payer: Self-pay

## 2021-04-16 ENCOUNTER — Encounter (HOSPITAL_COMMUNITY): Payer: BC Managed Care – PPO

## 2021-04-16 NOTE — Telephone Encounter (Signed)
Mychart message sent.

## 2021-04-18 ENCOUNTER — Encounter (HOSPITAL_COMMUNITY): Payer: BC Managed Care – PPO

## 2021-04-21 ENCOUNTER — Ambulatory Visit: Payer: BC Managed Care – PPO | Admitting: Gastroenterology

## 2021-04-21 ENCOUNTER — Encounter (HOSPITAL_COMMUNITY): Payer: BC Managed Care – PPO

## 2021-04-21 NOTE — Progress Notes (Deleted)
? ? ?Primary Care Physician: [name], MD ? ?Primary Gastroenterologist:  Dr. Lucilla Lame ? ?No chief complaint on file. ? ? ?HPI: Jacob Baker is a 51 y.o. male here after seeing me in the past for abdominal pain.  The patient abdominal pain has presented his chest pain and left-sided abdominal pain.  The patient has had a CT scan without any source of the pain seen.  In December of last year the patient had a repeat imaging of his abdomen that showed thickening of the gallbladder wall and a HIDA scan that was positive.  The patient was seen by surgery in December and because the patient was on blood thinners they elected to treat with antibiotics.  The patient's LFTs earlier this month were normal. ? ?Past Medical History:  ?Diagnosis Date  ? (HFpEF) heart failure with preserved ejection fraction ([place])   ? a. 12/2020 Echo: EF 65-70%, no rwma, mild LVH, nl RV fxn, triv MR.  ? CAD (coronary artery disease)   ? a. 12/2020 NSTEMI/PCI: LM nl, LAD min irregs, D1 99 (2.0x12 Onyx Frontier DES), LCX 85p (small), RCA 80p/m (4.0x18 Onyx Frontier DES - staged following day), 60d, RPDA mod dzs. EF 50-55%.  ? Diabetes mellitus without complication ([place])   ? DVT (deep venous thrombosis) ([place]) 04/2017  ? GERD (gastroesophageal reflux disease)   ? History of migraine   ? none in over 2 yrs  ? Hyperlipidemia   ? Hypertension   ? Syncope and collapse   ? x1 - approx 2-3 yrs ago - dehydration  ? ? ?Current Outpatient Medications  ?Medication Sig Dispense Refill  ? acetaminophen (TYLENOL) 325 MG tablet Take 2 tablets (650 mg total) by mouth every 6 (six) hours as needed for mild pain or fever. 12 tablet 0  ? amLODipine (NORVASC) 10 MG tablet TAKE 1 TABLET(10 MG) BY MOUTH DAILY 90 tablet 1  ? amoxicillin-clavulanate (AUGMENTIN) 875-125 MG tablet Take 1 tablet by mouth 2 (two) times daily. 20 tablet 0  ? aspirin 81 MG EC tablet Take 1 tablet (81 mg total) by mouth daily with breakfast. Swallow whole. 90 tablet 3  ?  dapagliflozin propanediol (FARXIGA) 10 MG TABS tablet Take 1 tablet (10 mg total) by mouth daily. 30 tablet 11  ? fluticasone (FLONASE) 50 MCG/ACT nasal spray Place 2 sprays into both nostrils daily. 16 g 1  ? glipiZIDE (GLUCOTROL XL) 5 MG 24 hr tablet Take 2 tablets (10 mg total) by mouth daily with breakfast. 180 tablet 1  ? glucose blood (ONETOUCH VERIO) test strip Test sugars twice daily 50 each 6  ? lisinopril (ZESTRIL) 20 MG tablet Take 1 tablet (20 mg total) by mouth daily. 30 tablet 11  ? metFORMIN (GLUCOPHAGE) 850 MG tablet TAKE 1 TABLET(850 MG) BY MOUTH TWICE DAILY WITH A MEAL. 180 tablet 1  ? metoprolol tartrate (LOPRESSOR) 25 MG tablet Take 1 tablet (25 mg total) by mouth 2 (two) times daily. 60 tablet 11  ? mirtazapine (REMERON) 7.5 MG tablet Take 1 tablet (7.5 mg total) by mouth at bedtime. (Patient not taking: Reported on 02/26/2021) 90 tablet 1  ? nitroGLYCERIN (NITROSTAT) 0.4 MG SL tablet Place 1 tablet (0.4 mg total) under the tongue every 5 (five) minutes as needed for chest pain. 25 tablet 1  ? ondansetron (ZOFRAN) 4 MG tablet Take 1 tablet (4 mg total) by mouth daily as needed for nausea or vomiting. 30 tablet 1  ? ONETOUCH DELICA LANCETS 10G MISC Test sugars twice  daily 50 each 3  ? oxyCODONE-acetaminophen (PERCOCET) 7.5-325 MG tablet Take 1 tablet by mouth every 6 (six) hours as needed for moderate pain or severe pain. (Patient not taking: Reported on 02/26/2021) 12 tablet 0  ? pantoprazole (PROTONIX) 40 MG tablet TAKE 1 TABLET(40 MG) BY MOUTH TWICE DAILY 180 tablet 0  ? polyethylene glycol (MIRALAX) 17 g packet Take 17 g by mouth daily. (Patient taking differently: Take 17 g by mouth daily as needed for moderate constipation.) 30 each 1  ? ranolazine (RANEXA) 500 MG 12 hr tablet Take 1 tablet (500 mg total) by mouth 2 (two) times daily. 90 tablet 11  ? rosuvastatin (CRESTOR) 40 MG tablet Take 40 mg by mouth daily.    ? ticagrelor (BRILINTA) 90 MG TABS tablet Take 1 tablet (90 mg total) by mouth 2  (two) times daily. 60 tablet 11  ? vitamin B-12 (CYANOCOBALAMIN) 1000 MCG tablet Take 1,000 mcg by mouth daily.    ? ?No current facility-administered medications for this visit.  ? ? ?Allergies as of 04/21/2021  ? (No Known Allergies)  ? ? ?ROS: ? ?General: Negative for anorexia, weight loss, fever, chills, fatigue, weakness. ?ENT: Negative for hoarseness, difficulty swallowing , nasal congestion. ?CV: Negative for chest pain, angina, palpitations, dyspnea on exertion, peripheral edema.  ?Respiratory: Negative for dyspnea at rest, dyspnea on exertion, cough, sputum, wheezing.  ?GI: See history of present illness. ?GU:  Negative for dysuria, hematuria, urinary incontinence, urinary frequency, nocturnal urination.  ?Endo: Negative for unusual weight change.  ?  ?Physical Examination: ? ? There were no vitals taken for this visit. ? ?General: Well-nourished, well-developed in no acute distress.  ?Eyes: No icterus. Conjunctivae pink. ?Lungs: Clear to auscultation bilaterally. Non-labored. ?Heart: Regular rate and rhythm, no murmurs rubs or gallops.  ?Abdomen: Bowel sounds are normal, nontender, nondistended, no hepatosplenomegaly or masses, no abdominal bruits or hernia , no rebound or guarding.   ?Extremities: No lower extremity edema. No clubbing or deformities. ?Neuro: Alert and oriented x 3.  Grossly intact. ?Skin: Warm and dry, no jaundice.   ?Psych: Alert and cooperative, normal mood and affect. ? ?Labs:  ?  ?Imaging Studies: ?No results found. ? ?Assessment and Plan:  ? ?Jacob Baker is a 51 y.o. y/o male *** ? ? ? ? ?Lucilla Lame, MD. Mar[facility] Regal ? ? ? Note: This dictation was prepared with Dragon dictation along with smaller phrase technology. Any transcriptional errors that result from this process are unintentional.  ?

## 2021-04-22 ENCOUNTER — Ambulatory Visit: Payer: BC Managed Care – PPO | Admitting: Family

## 2021-04-22 ENCOUNTER — Telehealth: Payer: Self-pay

## 2021-04-22 ENCOUNTER — Ambulatory Visit: Payer: BC Managed Care – PPO | Admitting: Student

## 2021-04-22 NOTE — Telephone Encounter (Signed)
Patient called and wanted to reschedule appointment so i rescheduled him ?

## 2021-04-23 ENCOUNTER — Encounter (HOSPITAL_COMMUNITY): Payer: BC Managed Care – PPO

## 2021-04-25 ENCOUNTER — Encounter (HOSPITAL_COMMUNITY): Payer: BC Managed Care – PPO

## 2021-04-26 ENCOUNTER — Other Ambulatory Visit: Payer: Self-pay | Admitting: "Endocrinology

## 2021-04-28 ENCOUNTER — Encounter (HOSPITAL_COMMUNITY): Payer: BC Managed Care – PPO

## 2021-04-29 ENCOUNTER — Telehealth: Payer: Self-pay | Admitting: "Endocrinology

## 2021-04-29 NOTE — Telephone Encounter (Signed)
Pt called back to resch appt.  ?

## 2021-04-30 ENCOUNTER — Ambulatory Visit: Payer: BC Managed Care – PPO | Admitting: "Endocrinology

## 2021-04-30 ENCOUNTER — Encounter (HOSPITAL_COMMUNITY): Payer: BC Managed Care – PPO

## 2021-05-02 ENCOUNTER — Encounter (HOSPITAL_COMMUNITY): Payer: BC Managed Care – PPO

## 2021-05-05 ENCOUNTER — Encounter (HOSPITAL_COMMUNITY): Payer: BC Managed Care – PPO

## 2021-05-05 ENCOUNTER — Ambulatory Visit: Payer: BC Managed Care – PPO | Admitting: "Endocrinology

## 2021-05-05 ENCOUNTER — Encounter: Payer: Self-pay | Admitting: "Endocrinology

## 2021-05-05 VITALS — BP 118/62 | HR 80 | Ht 73.0 in | Wt 188.0 lb

## 2021-05-05 DIAGNOSIS — E782 Mixed hyperlipidemia: Secondary | ICD-10-CM

## 2021-05-05 DIAGNOSIS — E1165 Type 2 diabetes mellitus with hyperglycemia: Secondary | ICD-10-CM | POA: Diagnosis not present

## 2021-05-05 MED ORDER — DAPAGLIFLOZIN PROPANEDIOL 5 MG PO TABS: 5.0000 mg | ORAL_TABLET | Freq: Every day | ORAL | 1 refills | Status: DC

## 2021-05-05 NOTE — Progress Notes (Signed)
? ?                                                     ?     05/05/2021, 5:10 PM ? ? ?Endocrinology follow-up note ? ?Subjective:  ? ? Patient ID: Jacob Baker, male    DOB: 1970-08-01.  ?Jacob Baker is being seen in follow-up after he was seen in consultation for management of currently uncontrolled symptomatic diabetes requested by  [name], MD. ? ? ?Past Medical History:  ?Diagnosis Date  ? (HFpEF) heart failure with preserved ejection fraction ([place])   ? a. 12/2020 Echo: EF 65-70%, no rwma, mild LVH, nl RV fxn, triv MR.  ? CAD (coronary artery disease)   ? a. 12/2020 NSTEMI/PCI: LM nl, LAD min irregs, D1 99 (2.0x12 Onyx Frontier DES), LCX 85p (small), RCA 80p/m (4.0x18 Onyx Frontier DES - staged following day), 60d, RPDA mod dzs. EF 50-55%.  ? Diabetes mellitus without complication ([place])   ? DVT (deep venous thrombosis) ([place]) 04/2017  ? GERD (gastroesophageal reflux disease)   ? History of migraine   ? none in over 2 yrs  ? Hyperlipidemia   ? Hypertension   ? Syncope and collapse   ? x1 - approx 2-3 yrs ago - dehydration  ? ? ?Past Surgical History:  ?Procedure Laterality Date  ? COLONOSCOPY    ? COLONOSCOPY WITH PROPOFOL N/A 08/06/2017  ? Procedure: COLONOSCOPY WITH PROPOFOL;  Surgeon: Lucilla Lame, MD;  Location: [facility];  Service: Endoscopy;  Laterality: N/A;  Diabetic - oral meds  ? CORONARY STENT INTERVENTION N/A 12/31/2020  ? Procedure: CORONARY STENT INTERVENTION;  Surgeon: Belva Crome, MD;  Location: [facility];  Service: Cardiovascular;  Laterality: N/A;  ? CORONARY STENT INTERVENTION N/A 01/01/2021  ? Procedure: CORONARY STENT INTERVENTION;  Surgeon: [name], MD;  Location: [facility];  Service: Cardiovascular;  Laterality: N/A;  ? ESOPHAGEAL DILATION  09/24/2016  ? Procedure: ESOPHAGEAL DILATION;  Surgeon: Lucilla Lame, MD;  Location: [facility];  Service: Gastroenterology;;  ? ESOPHAGOGASTRODUODENOSCOPY (EGD) WITH PROPOFOL  N/A 04/08/2015  ? Procedure: ESOPHAGOGASTRODUODENOSCOPY (EGD) WITH PROPOFOL with dialation;  Surgeon: Lucilla Lame, MD;  Location: [facility];  Service: Endoscopy;  Laterality: N/A;  diabetic - oral meds  ? ESOPHAGOGASTRODUODENOSCOPY (EGD) WITH PROPOFOL N/A 09/24/2016  ? Procedure: ESOPHAGOGASTRODUODENOSCOPY (EGD) WITH PROPOFOL WITH DILATION;  Surgeon: Lucilla Lame, MD;  Location: [facility];  Service: Gastroenterology;  Laterality: N/A;  Diabetic - oral meds  ? FINGER AMPUTATION  2009  ? partial removal left index finger   ? LEFT HEART CATH AND CORONARY ANGIOGRAPHY N/A 12/31/2020  ? Procedure: LEFT HEART CATH AND CORONARY ANGIOGRAPHY;  Surgeon: Belva Crome, MD;  Location: [facility];  Service: Cardiovascular;  Laterality: N/A;  ? LEFT HEART CATH AND CORONARY ANGIOGRAPHY N/A 01/01/2021  ? Procedure: LEFT HEART CATH AND CORONARY ANGIOGRAPHY;  Surgeon: [name], MD;  Location: [facility];  Service: Cardiovascular;  Laterality: N/A;  ? POLYPECTOMY  08/06/2017  ? Procedure: POLYPECTOMY INTESTINAL;  Surgeon: Lucilla Lame, MD;  Location: [facility];  Service: Endoscopy;;  ? ? ?Social History  ? ?Socioeconomic History  ? Marital status: Married  ?  Spouse name: Not on file  ? Number of children: Not  on file  ? Years of education: Not on file  ? Highest education level: Not on file  ?Occupational History  ? Not on file  ?Tobacco Use  ? Smoking status: Former  ?  Packs/day: 2.00  ?  Years: 27.00  ?  Pack years: 54.00  ?  Types: Cigarettes  ?  Quit date: 12/30/2020  ?  Years since quitting: 0.3  ? Smokeless tobacco: Never  ?Vaping Use  ? Vaping Use: Never used  ?Substance and Sexual Activity  ? Alcohol use: No  ? Drug use: No  ? Sexual activity: Not on file  ?Other Topics Concern  ? Not on file  ?Social History Narrative  ? Not on file  ? ?Social Determinants of Health  ? ?Financial Resource Strain: Not on file  ?Food Insecurity: Not on file  ?Transportation Needs: Not on  file  ?Physical Activity: Not on file  ?Stress: Not on file  ?Social Connections: Not on file  ? ? ?Family History  ?Problem Relation Age of Onset  ? Heart disease Father   ?     CAD  ? Hypertension Father   ? Diabetes Father   ? Hypertension Sister   ? Hypertension Brother   ? Hypertension Brother   ? Hypertension Mother   ? ? ?Outpatient Encounter Medications as of 05/05/2021  ?Medication Sig  ? dapagliflozin propanediol (FARXIGA) 5 MG TABS tablet Take 1 tablet (5 mg total) by mouth daily before breakfast.  ? acetaminophen (TYLENOL) 325 MG tablet Take 2 tablets (650 mg total) by mouth every 6 (six) hours as needed for mild pain or fever.  ? amLODipine (NORVASC) 10 MG tablet TAKE 1 TABLET(10 MG) BY MOUTH DAILY  ? amoxicillin-clavulanate (AUGMENTIN) 875-125 MG tablet Take 1 tablet by mouth 2 (two) times daily.  ? aspirin 81 MG EC tablet Take 1 tablet (81 mg total) by mouth daily with breakfast. Swallow whole.  ? fluticasone (FLONASE) 50 MCG/ACT nasal spray Place 2 sprays into both nostrils daily.  ? glucose blood (ONETOUCH VERIO) test strip Test sugars twice daily  ? lisinopril (ZESTRIL) 20 MG tablet Take 1 tablet (20 mg total) by mouth daily.  ? metFORMIN (GLUCOPHAGE) 850 MG tablet TAKE 1 TABLET(850 MG) BY MOUTH TWICE DAILY WITH A MEAL.  ? metoprolol tartrate (LOPRESSOR) 25 MG tablet Take 1 tablet (25 mg total) by mouth 2 (two) times daily.  ? nitroGLYCERIN (NITROSTAT) 0.4 MG SL tablet Place 1 tablet (0.4 mg total) under the tongue every 5 (five) minutes as needed for chest pain.  ? ondansetron (ZOFRAN) 4 MG tablet Take 1 tablet (4 mg total) by mouth daily as needed for nausea or vomiting.  ? ONETOUCH DELICA LANCETS 66A MISC Test sugars twice daily  ? pantoprazole (PROTONIX) 40 MG tablet TAKE 1 TABLET(40 MG) BY MOUTH TWICE DAILY  ? polyethylene glycol (MIRALAX) 17 g packet Take 17 g by mouth daily. (Patient taking differently: Take 17 g by mouth daily as needed for moderate constipation.)  ? ranolazine (RANEXA) 500  MG 12 hr tablet Take 1 tablet (500 mg total) by mouth 2 (two) times daily.  ? rosuvastatin (CRESTOR) 40 MG tablet Take 40 mg by mouth daily.  ? ticagrelor (BRILINTA) 90 MG TABS tablet Take 1 tablet (90 mg total) by mouth 2 (two) times daily.  ? vitamin B-12 (CYANOCOBALAMIN) 1000 MCG tablet Take 1,000 mcg by mouth daily.  ? [DISCONTINUED] dapagliflozin propanediol (FARXIGA) 10 MG TABS tablet Take 1 tablet (10 mg total) by mouth daily.  ? [  DISCONTINUED] glipiZIDE (GLUCOTROL XL) 5 MG 24 hr tablet TAKE 1 TABLET(5 MG) BY MOUTH DAILY WITH BREAKFAST  ? [DISCONTINUED] mirtazapine (REMERON) 7.5 MG tablet Take 1 tablet (7.5 mg total) by mouth at bedtime. (Patient not taking: Reported on 02/26/2021)  ? [DISCONTINUED] oxyCODONE-acetaminophen (PERCOCET) 7.5-325 MG tablet Take 1 tablet by mouth every 6 (six) hours as needed for moderate pain or severe pain. (Patient not taking: Reported on 02/26/2021)  ? ?No facility-administered encounter medications on file as of 05/05/2021.  ? ? ?ALLERGIES: ?No Known Allergies ? ?VACCINATION STATUS: ?Immunization History  ?Administered Date(s) Administered  ? Influenza Split 12/15/2013  ? Influenza,inj,Quad PF,6+ Mos 10/26/2014, 10/14/2015, 10/13/2017, 10/18/2018, 10/20/2019, 10/21/2020  ? Influenza-Unspecified 09/26/2016  ? Moderna Sars-Covid-2 Vaccination 04/28/2019, 05/30/2019  ? Pneumococcal Polysaccharide-23 10/14/2015  ? Tdap 10/26/2014  ? ? ?Diabetes ?He presents for his follow-up diabetic visit. He has type 2 diabetes mellitus. His disease course has been improving. There are no hypoglycemic associated symptoms. Pertinent negatives for hypoglycemia include no confusion, headaches, pallor or seizures. Pertinent negatives for diabetes include no chest pain, no fatigue, no polydipsia, no polyphagia, no polyuria and no weakness. Symptoms are improving. Diabetic complications include heart disease. (In the interim , he developed chest pain, hospitalized and diagnosed with ST Elevation MI. He is  s/p stent placement.) Risk factors for coronary artery disease include dyslipidemia, diabetes mellitus, male sex, family history and tobacco exposure. Current diabetic treatment includes oral agent (tri

## 2021-05-07 ENCOUNTER — Encounter: Payer: Self-pay | Admitting: Gastroenterology

## 2021-05-07 ENCOUNTER — Encounter (HOSPITAL_COMMUNITY): Payer: BC Managed Care – PPO

## 2021-05-07 ENCOUNTER — Ambulatory Visit (INDEPENDENT_AMBULATORY_CARE_PROVIDER_SITE_OTHER): Payer: BC Managed Care – PPO | Admitting: Gastroenterology

## 2021-05-07 VITALS — BP 132/81 | HR 72 | Temp 98.3°F | Wt 188.0 lb

## 2021-05-07 DIAGNOSIS — R1084 Generalized abdominal pain: Secondary | ICD-10-CM

## 2021-05-07 NOTE — Progress Notes (Addendum)
? ? ?Primary Care Physician: [name], MD ? ?Primary Gastroenterologist:  Dr. Lucilla Lame ? ?Chief Complaint  ?Patient presents with  ? Follow-up  ? ? ?HPI: Jacob Baker is a 51 y.o. male here for follow-up after seeing me back in 2022 for constipation and had a colonoscopy in 2019 that was negative for any obstruction but did have polyps seen. Later in 2022 the patient sore me for left-sided abdominal pain that was not related to food and reproducible with muscle flexion.  The patient presented to the ED in December 2022 with acute cholecystitis and an MI and was seen by surgery and recommended to take antibiotics and hold off on any surgery for 6 months. ?The patient comes in today because he is under the impression that he needs to make an appoint with me to have his gallbladder removed. ? ?Past Medical History:  ?Diagnosis Date  ? (HFpEF) heart failure with preserved ejection fraction ([place])   ? a. 12/2020 Echo: EF 65-70%, no rwma, mild LVH, nl RV fxn, triv MR.  ? CAD (coronary artery disease)   ? a. 12/2020 NSTEMI/PCI: LM nl, LAD min irregs, D1 99 (2.0x12 Onyx Frontier DES), LCX 85p (small), RCA 80p/m (4.0x18 Onyx Frontier DES - staged following day), 60d, RPDA mod dzs. EF 50-55%.  ? Diabetes mellitus without complication ([place])   ? DVT (deep venous thrombosis) ([place]) 04/2017  ? GERD (gastroesophageal reflux disease)   ? History of migraine   ? none in over 2 yrs  ? Hyperlipidemia   ? Hypertension   ? Syncope and collapse   ? x1 - approx 2-3 yrs ago - dehydration  ? ? ?Current Outpatient Medications  ?Medication Sig Dispense Refill  ? acetaminophen (TYLENOL) 325 MG tablet Take 2 tablets (650 mg total) by mouth every 6 (six) hours as needed for mild pain or fever. 12 tablet 0  ? aspirin 81 MG EC tablet Take 1 tablet (81 mg total) by mouth daily with breakfast. Swallow whole. 90 tablet 3  ? dapagliflozin propanediol (FARXIGA) 5 MG TABS tablet Take 1 tablet (5 mg total) by mouth daily before breakfast. 90  tablet 1  ? glipiZIDE (GLUCOTROL) 10 MG tablet Take 10 mg by mouth daily before breakfast.    ? glucose blood (ONETOUCH VERIO) test strip Test sugars twice daily 50 each 6  ? lisinopril (ZESTRIL) 20 MG tablet Take 1 tablet (20 mg total) by mouth daily. 30 tablet 11  ? metFORMIN (GLUCOPHAGE) 850 MG tablet TAKE 1 TABLET(850 MG) BY MOUTH TWICE DAILY WITH A MEAL. 180 tablet 1  ? metoprolol tartrate (LOPRESSOR) 25 MG tablet Take 1 tablet (25 mg total) by mouth 2 (two) times daily. 60 tablet 11  ? nitroGLYCERIN (NITROSTAT) 0.4 MG SL tablet Place 1 tablet (0.4 mg total) under the tongue every 5 (five) minutes as needed for chest pain. 25 tablet 1  ? ONETOUCH DELICA LANCETS 19Q MISC Test sugars twice daily 50 each 3  ? pantoprazole (PROTONIX) 40 MG tablet TAKE 1 TABLET(40 MG) BY MOUTH TWICE DAILY 180 tablet 0  ? polyethylene glycol (MIRALAX) 17 g packet Take 17 g by mouth daily. (Patient taking differently: Take 17 g by mouth daily as needed for moderate constipation.) 30 each 1  ? rosuvastatin (CRESTOR) 40 MG tablet Take 40 mg by mouth daily.    ? ticagrelor (BRILINTA) 90 MG TABS tablet Take 1 tablet (90 mg total) by mouth 2 (two) times daily. 60 tablet 11  ? vitamin B-12 (  CYANOCOBALAMIN) 1000 MCG tablet Take 1,000 mcg by mouth daily.    ? ?No current facility-administered medications for this visit.  ? ? ?Allergies as of 05/07/2021  ? (No Known Allergies)  ? ? ?ROS: ? ?General: Negative for anorexia, weight loss, fever, chills, fatigue, weakness. ?ENT: Negative for hoarseness, difficulty swallowing , nasal congestion. ?CV: Negative for chest pain, angina, palpitations, dyspnea on exertion, peripheral edema.  ?Respiratory: Negative for dyspnea at rest, dyspnea on exertion, cough, sputum, wheezing.  ?GI: See history of present illness. ?GU:  Negative for dysuria, hematuria, urinary incontinence, urinary frequency, nocturnal urination.  ?Endo: Negative for unusual weight change.  ?  ?Physical Examination: ? ? BP 132/81    Pulse 72   Temp 98.3 ?F (36.8 ?C) (Oral)   Wt 188 lb (85.3 kg)   BMI 24.80 kg/m?  ? ?General: Well-nourished, well-developed in no acute distress.  ?Eyes: No icterus. Conjunctivae pink. ?Extremities: No lower extremity edema. No clubbing or deformities. ?Neuro: Alert and oriented x 3.  Grossly intact. ?Skin: Warm and dry, no jaundice.   ?Psych: Alert and cooperative, normal mood and affect. ? ?Labs:  ?  ?Imaging Studies: ?No results found. ? ?Assessment and Plan:  ? ?Jacob Baker is a 51 y.o. y/o male who comes in today with a history of acute cholecystitis treated with antibiotics since he had 2 stents placed in his heart and needed anticoagulation and could not undergo the surgery.  The patient is doing well now but is ready set up surgery to have his gallbladder removed.  The patient will be sent to Dr.Pabon.  The patient and his wife have been explained the plan and agree with it ? ? ? ? ?Lucilla Lame, MD. Mar[facility] Regal ? ? ? Note: This dictation was prepared with Dragon dictation along with smaller phrase technology. Any transcriptional errors that result from this process are unintentional.  ?

## 2021-05-07 NOTE — Patient Instructions (Signed)
Referral placed to Dr Dahlia Byes ?[address], [place], [place] [address] ?[phone]

## 2021-05-09 ENCOUNTER — Encounter (HOSPITAL_COMMUNITY): Payer: BC Managed Care – PPO

## 2021-05-12 ENCOUNTER — Encounter (HOSPITAL_COMMUNITY): Payer: BC Managed Care – PPO

## 2021-05-14 ENCOUNTER — Encounter (HOSPITAL_COMMUNITY): Payer: BC Managed Care – PPO

## 2021-05-14 ENCOUNTER — Ambulatory Visit: Payer: BC Managed Care – PPO | Admitting: Surgery

## 2021-05-14 ENCOUNTER — Other Ambulatory Visit: Payer: Self-pay

## 2021-05-14 ENCOUNTER — Encounter: Payer: Self-pay | Admitting: Surgery

## 2021-05-14 VITALS — BP 128/77 | HR 81 | Temp 98.1°F | Ht 73.0 in | Wt 195.4 lb

## 2021-05-14 DIAGNOSIS — K8020 Calculus of gallbladder without cholecystitis without obstruction: Secondary | ICD-10-CM | POA: Diagnosis not present

## 2021-05-14 NOTE — Progress Notes (Deleted)
Jacob Husbands, MD ?Physician ?Surgery ?Consult Note    ?Sign when Signing Visit ?Encounter Date:  05/14/2021 ?  ?Sign when Signing Visit    ?  ?   ?   ?   ?   ?   ?   ?   ?   ?   ?   ?   ?   ?   ?   ?   ?   ?   ?   ?   ?   ?   ?   ?   ?   ?   ?   ?   ?   ?   ?   ?   ?   ?   ?   ?   ?   ?   ?   ?   ?   ?   ?   ?   ?   ?   ?   ?   ?   ?   ?   ?   ?   ?   ?   ?   ?   ?Patient ID: [name], male   DOB: 09/11/1970, 51 y.o.   MRN: [id number] ?  ?HPI ?Jacob Baker is a 51 y.o. male seen in consultation at the request of Dr.Wohl.  He did have non-STEMI back in December 31, 2020 and he required cardiac cath with DES stent placed on both the diagonal and the RCA, widely patent left main.  He did develop angina and was placed on appropriate cardioprotective medication with improvement of symptoms.  Few days later came back to the hospital with abdominal pain and changes consistent with acute cholecystitis confirmed with ultrasound and HIDA scans ( personally reviwed).  This was antipain hospital and at that time given the acuteness of the coronary event decision was made to treat cholecystitis medically with antibiotics.  Patient responded appropriately.  More recently saw GI and there is concern for gallbladder issues in the near future.  He states that currently he has no abdominal pain or chest pain.  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  Denies any shortness of breath.  No fevers no chills. ?He is stopped smoking shortly after he had his last heart attack 4 months ago ? ?  ?HPI ?  ?    ?Past Medical History:  ?Diagnosis Date  ? (HFpEF) heart failure with preserved ejection fraction ([place])    ?  a. 12/2020 Echo: EF 65-70%, no rwma, mild LVH, nl RV fxn, triv MR.  ? CAD (coronary artery disease)    ?  a. 12/2020 NSTEMI/PCI: LM nl, LAD min irregs, D1 99 (2.0x12 Onyx Frontier DES), LCX 85p (small), RCA 80p/m (4.0x18 Onyx Frontier DES - staged following day), 60d, RPDA mod dzs. EF  50-55%.  ? Diabetes mellitus without complication ([place])    ? DVT (deep venous thrombosis) ([place]) 04/2017  ? GERD (gastroesophageal reflux disease)    ? History of migraine    ?  none in over 2 yrs  ? Hyperlipidemia    ? Hypertension    ? Syncope and collapse    ?  x1 - approx 2-3 yrs ago - dehydration  ?  ?  ?     ?Past Surgical History:  ?Procedure Laterality Date  ? COLONOSCOPY      ? COLONOSCOPY WITH PROPOFOL N/A 08/06/2017  ?  Procedure: COLONOSCOPY WITH PROPOFOL;  Surgeon: Lucilla Lame, MD;  Location: [facility];  Service: Endoscopy;  Laterality: N/A;  Diabetic - oral meds  ? CORONARY STENT INTERVENTION N/A 12/31/2020  ?  Procedure: CORONARY STENT INTERVENTION;  Surgeon: Belva Crome, MD;  Location: [facility];  Service: Cardiovascular;  Laterality: N/A;  ? CORONARY STENT INTERVENTION N/A 01/01/2021  ?  Procedure: CORONARY STENT INTERVENTION;  Surgeon: [name], MD;  Location: [facility];  Service: Cardiovascular;  Laterality: N/A;  ? ESOPHAGEAL DILATION   09/24/2016  ?  Procedure: ESOPHAGEAL DILATION;  Surgeon: Lucilla Lame, MD;  Location: [facility];  Service: Gastroenterology;;  ? ESOPHAGOGASTRODUODENOSCOPY (EGD) WITH PROPOFOL N/A 04/08/2015  ?  Procedure: ESOPHAGOGASTRODUODENOSCOPY (EGD) WITH PROPOFOL with dialation;  Surgeon: Lucilla Lame, MD;  Location: [facility];  Service: Endoscopy;  Laterality: N/A;  diabetic - oral meds  ? ESOPHAGOGASTRODUODENOSCOPY (EGD) WITH PROPOFOL N/A 09/24/2016  ?  Procedure: ESOPHAGOGASTRODUODENOSCOPY (EGD) WITH PROPOFOL WITH DILATION;  Surgeon: Lucilla Lame, MD;  Location: [facility];  Service: Gastroenterology;  Laterality: N/A;  Diabetic - oral meds  ? FINGER AMPUTATION   2009  ?  partial removal left index finger   ? LEFT HEART CATH AND CORONARY ANGIOGRAPHY N/A 12/31/2020  ?  Procedure: LEFT HEART CATH AND CORONARY ANGIOGRAPHY;  Surgeon: Belva Crome, MD;  Location: [facility];  Service: Cardiovascular;   Laterality: N/A;  ? LEFT HEART CATH AND CORONARY ANGIOGRAPHY N/A 01/01/2021  ?  Procedure: LEFT HEART CATH AND CORONARY ANGIOGRAPHY;  Surgeon: [name], MD;  Location: [facility];  Service: Cardiovascular;  Laterality: N/A;  ? POLYPECTOMY   08/06/2017  ?  Procedure: POLYPECTOMY INTESTINAL;  Surgeon: Lucilla Lame, MD;  Location: [facility];  Service: Endoscopy;;  ?  ?  ?     ?Family History  ?Problem Relation Age of Onset  ? Hypertension Mother    ? Heart disease Father    ?      CAD  ? Hypertension Father    ? Diabetes Father    ? Hypertension Sister    ? Hypertension Brother    ? Hypertension Brother    ?  ?  ?Social History ?Social History  ?  ?     ?Tobacco Use  ? Smoking status: Former  ?    Packs/day: 2.00  ?    Years: 27.00  ?    Pack years: 54.00  ?    Types: Cigarettes  ?    Quit date: 12/30/2020  ?    Years since quitting: 0.3  ? Smokeless tobacco: Never  ?Vaping Use  ? Vaping Use: Never used  ?Substance Use Topics  ? Alcohol use: No  ? Drug use: No  ?  ?  ?No Known Allergies ?  ?      ?Current Outpatient Medications  ?Medication Sig Dispense Refill  ? acetaminophen (TYLENOL) 325 MG tablet Take 2 tablets (650 mg total) by mouth every 6 (six) hours as needed for mild pain or fever. 12 tablet 0  ? aspirin 81 MG EC tablet Take 1 tablet (81 mg total) by mouth daily with breakfast. Swallow whole. 90 tablet 3  ? dapagliflozin propanediol (FARXIGA) 5 MG TABS tablet Take 1 tablet (5 mg total) by mouth daily before breakfast. 90 tablet 1  ? glipiZIDE (GLUCOTROL) 10 MG tablet Take 10 mg by mouth daily before breakfast.      ? glucose blood (  ONETOUCH VERIO) test strip Test sugars twice daily 50 each 6  ? lisinopril (ZESTRIL) 20 MG tablet Take 1 tablet (20 mg total) by mouth daily. 30 tablet 11  ? metFORMIN (GLUCOPHAGE) 850 MG tablet TAKE 1 TABLET(850 MG) BY MOUTH TWICE DAILY WITH A MEAL. 180 tablet 1  ? metoprolol tartrate (LOPRESSOR) 25 MG tablet Take 1 tablet (25 mg total) by mouth 2 (two)  times daily. 60 tablet 11  ? nitroGLYCERIN (NITROSTAT) 0.4 MG SL tablet Place 1 tablet (0.4 mg total) under the tongue every 5 (five) minutes as needed for chest pain. 25 tablet 1  ? ONETOUCH DELICA LANCETS 00P MISC Test sugars twice daily 50 each 3  ? pantoprazole (PROTONIX) 40 MG tablet TAKE 1 TABLET(40 MG) BY MOUTH TWICE DAILY 180 tablet 0  ? polyethylene glycol (MIRALAX) 17 g packet Take 17 g by mouth daily. (Patient taking differently: Take 17 g by mouth daily as needed for moderate constipation.) 30 each 1  ? rosuvastatin (CRESTOR) 40 MG tablet Take 40 mg by mouth daily.      ? ticagrelor (BRILINTA) 90 MG TABS tablet Take 1 tablet (90 mg total) by mouth 2 (two) times daily. 60 tablet 11  ? vitamin B-12 (CYANOCOBALAMIN) 1000 MCG tablet Take 1,000 mcg by mouth daily.      ?  ?No current facility-administered medications for this visit.  ?  ?  ?  ?Review of Systems ?Full ROS  was asked and was negative except for the information on the HPI ?  ?Physical Exam ?Blood pressure 128/77, pulse 81, temperature 98.1 ?F (36.7 ?C), temperature source Oral, height '6\' 1"'$  (1.854 m), weight 195 lb 6.4 oz (88.6 kg), SpO2 97 %. ?CONSTITUTIONAL: NAD. ?EYES: Pupils are equal, round,  Sclera are non-icteric. ?EARS, NOSE, MOUTH AND THROAT:  The oral mucosa is pink and moist. Hearing is intact to voice. ?LYMPH NODES:  Lymph nodes in the neck are normal. ?RESPIRATORY:  Lungs are clear. There is normal respiratory effort, with equal breath sounds bilaterally, and without pathologic use of accessory muscles. ?CARDIOVASCULAR: Heart is regular without murmurs, gallops, or rubs. ?GI: The abdomen is  soft, nontender, and nondistended. There are no palpable masses. There is no hepatosplenomegaly. There are normal bowel sounds in all quadrants. ?GU: Rectal deferred.   ?MUSCULOSKELETAL: Normal muscle strength and tone. No cyanosis or edema.   ?SKIN: Turgor is good and there are no pathologic skin lesions or ulcers. ?NEUROLOGIC: Motor and  sensation is grossly normal. Cranial nerves are grossly intact. ?PSYCH:  Oriented to person, place and time. Affect is normal. ?  ?Data Reviewed ?  ?I have personally reviewed the patient's imaging, laborato

## 2021-05-14 NOTE — Consult Note (Signed)
Patient ID: Jacob Baker, male   DOB: 06-Mar-1970, 51 y.o.   MRN: [id number] ? ?HPI ?Jacob Baker is a 51 y.o. male seen in consultation at the request of Dr.Wohl.  He did have non-STEMI back in December 31, 2020 and he required cardiac cath with DES stent placed on both the diagonal and the RCA, widely patent left main.  He did develop angina and was placed on appropriate cardioprotective medication with improvement of symptoms.  Few days later came back to the hospital with abdominal pain and changes consistent with acute cholecystitis confirmed with ultrasound and HIDA scans ( personally reviwed).  This was antipain hospital and at that time given the acuteness of the coronary event decision was made to treat cholecystitis medically with antibiotics.  Patient responded appropriately.  More recently saw GI and there is concern for gallbladder issues in the near future.  He states that currently he has no abdominal pain or chest pain.  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  Denies any shortness of breath.  No fevers no chills. ?He is stopped smoking shortly after he had his last heart attack 4 months ago ? ? ?HPI ? ?Past Medical History:  ?Diagnosis Date  ? (HFpEF) heart failure with preserved ejection fraction ([place])   ? a. 12/2020 Echo: EF 65-70%, no rwma, mild LVH, nl RV fxn, triv MR.  ? CAD (coronary artery disease)   ? a. 12/2020 NSTEMI/PCI: LM nl, LAD min irregs, D1 99 (2.0x12 Onyx Frontier DES), LCX 85p (small), RCA 80p/m (4.0x18 Onyx Frontier DES - staged following day), 60d, RPDA mod dzs. EF 50-55%.  ? Diabetes mellitus without complication ([place])   ? DVT (deep venous thrombosis) ([place]) 04/2017  ? GERD (gastroesophageal reflux disease)   ? History of migraine   ? none in over 2 yrs  ? Hyperlipidemia   ? Hypertension   ? Syncope and collapse   ? x1 - approx 2-3 yrs ago - dehydration  ? ? ?Past Surgical History:  ?Procedure Laterality Date  ? COLONOSCOPY    ?  COLONOSCOPY WITH PROPOFOL N/A 08/06/2017  ? Procedure: COLONOSCOPY WITH PROPOFOL;  Surgeon: Lucilla Lame, MD;  Location: [facility];  Service: Endoscopy;  Laterality: N/A;  Diabetic - oral meds  ? CORONARY STENT INTERVENTION N/A 12/31/2020  ? Procedure: CORONARY STENT INTERVENTION;  Surgeon: Belva Crome, MD;  Location: [facility];  Service: Cardiovascular;  Laterality: N/A;  ? CORONARY STENT INTERVENTION N/A 01/01/2021  ? Procedure: CORONARY STENT INTERVENTION;  Surgeon: [name], MD;  Location: [facility];  Service: Cardiovascular;  Laterality: N/A;  ? ESOPHAGEAL DILATION  09/24/2016  ? Procedure: ESOPHAGEAL DILATION;  Surgeon: Lucilla Lame, MD;  Location: [facility];  Service: Gastroenterology;;  ? ESOPHAGOGASTRODUODENOSCOPY (EGD) WITH PROPOFOL N/A 04/08/2015  ? Procedure: ESOPHAGOGASTRODUODENOSCOPY (EGD) WITH PROPOFOL with dialation;  Surgeon: Lucilla Lame, MD;  Location: [facility];  Service: Endoscopy;  Laterality: N/A;  diabetic - oral meds  ? ESOPHAGOGASTRODUODENOSCOPY (EGD) WITH PROPOFOL N/A 09/24/2016  ? Procedure: ESOPHAGOGASTRODUODENOSCOPY (EGD) WITH PROPOFOL WITH DILATION;  Surgeon: Lucilla Lame, MD;  Location: [facility];  Service: Gastroenterology;  Laterality: N/A;  Diabetic - oral meds  ? FINGER AMPUTATION  2009  ? partial removal left index finger   ? LEFT HEART CATH AND CORONARY ANGIOGRAPHY N/A 12/31/2020  ? Procedure: LEFT HEART CATH AND CORONARY ANGIOGRAPHY;  Surgeon: Daneen Schick  W, MD;  Location: [facility];  Service: Cardiovascular;  Laterality: N/A;  ? LEFT HEART CATH AND CORONARY ANGIOGRAPHY N/A 01/01/2021  ? Procedure: LEFT HEART CATH AND CORONARY ANGIOGRAPHY;  Surgeon: [name], MD;  Location: [facility];  Service: Cardiovascular;  Laterality: N/A;  ? POLYPECTOMY  08/06/2017  ? Procedure: POLYPECTOMY INTESTINAL;  Surgeon: Lucilla Lame, MD;  Location: [facility];  Service: Endoscopy;;  ? ? ?Family  History  ?Problem Relation Age of Onset  ? Hypertension Mother   ? Heart disease Father   ?     CAD  ? Hypertension Father   ? Diabetes Father   ? Hypertension Sister   ? Hypertension Brother   ? Hypertension Brother   ? ? ?Social History ?Social History  ? ?Tobacco Use  ? Smoking status: Former  ?  Packs/day: 2.00  ?  Years: 27.00  ?  Pack years: 54.00  ?  Types: Cigarettes  ?  Quit date: 12/30/2020  ?  Years since quitting: 0.3  ? Smokeless tobacco: Never  ?Vaping Use  ? Vaping Use: Never used  ?Substance Use Topics  ? Alcohol use: No  ? Drug use: No  ? ? ?No Known Allergies ? ?Current Outpatient Medications  ?Medication Sig Dispense Refill  ? acetaminophen (TYLENOL) 325 MG tablet Take 2 tablets (650 mg total) by mouth every 6 (six) hours as needed for mild pain or fever. 12 tablet 0  ? aspirin 81 MG EC tablet Take 1 tablet (81 mg total) by mouth daily with breakfast. Swallow whole. 90 tablet 3  ? dapagliflozin propanediol (FARXIGA) 5 MG TABS tablet Take 1 tablet (5 mg total) by mouth daily before breakfast. 90 tablet 1  ? glipiZIDE (GLUCOTROL) 10 MG tablet Take 10 mg by mouth daily before breakfast.    ? glucose blood (ONETOUCH VERIO) test strip Test sugars twice daily 50 each 6  ? lisinopril (ZESTRIL) 20 MG tablet Take 1 tablet (20 mg total) by mouth daily. 30 tablet 11  ? metFORMIN (GLUCOPHAGE) 850 MG tablet TAKE 1 TABLET(850 MG) BY MOUTH TWICE DAILY WITH A MEAL. 180 tablet 1  ? metoprolol tartrate (LOPRESSOR) 25 MG tablet Take 1 tablet (25 mg total) by mouth 2 (two) times daily. 60 tablet 11  ? nitroGLYCERIN (NITROSTAT) 0.4 MG SL tablet Place 1 tablet (0.4 mg total) under the tongue every 5 (five) minutes as needed for chest pain. 25 tablet 1  ? ONETOUCH DELICA LANCETS 82X MISC Test sugars twice daily 50 each 3  ? pantoprazole (PROTONIX) 40 MG tablet TAKE 1 TABLET(40 MG) BY MOUTH TWICE DAILY 180 tablet 0  ? polyethylene glycol (MIRALAX) 17 g packet Take 17 g by mouth daily. (Patient taking differently: Take 17  g by mouth daily as needed for moderate constipation.) 30 each 1  ? rosuvastatin (CRESTOR) 40 MG tablet Take 40 mg by mouth daily.    ? ticagrelor (BRILINTA) 90 MG TABS tablet Take 1 tablet (90 mg total) by mouth 2 (two) times daily. 60 tablet 11  ? vitamin B-12 (CYANOCOBALAMIN) 1000 MCG tablet Take 1,000 mcg by mouth daily.    ? ?No current facility-administered medications for this visit.  ? ? ? ?Review of Systems ?Full ROS  was asked and was negative except for the information on the HPI ? ?Physical Exam ?Blood pressure 128/77, pulse 81, temperature 98.1 ?F (36.7 ?C), temperature source Oral, height '6\' 1"'$  (1.854 m), weight 195 lb 6.4 oz (88.6 kg), SpO2 97 %. ?  CONSTITUTIONAL: NAD. ?EYES: Pupils are equal, round,  Sclera are non-icteric. ?EARS, NOSE, MOUTH AND THROAT:  The oral mucosa is pink and moist. Hearing is intact to voice. ?LYMPH NODES:  Lymph nodes in the neck are normal. ?RESPIRATORY:  Lungs are clear. There is normal respiratory effort, with equal breath sounds bilaterally, and without pathologic use of accessory muscles. ?CARDIOVASCULAR: Heart is regular without murmurs, gallops, or rubs. ?GI: The abdomen is  soft, nontender, and nondistended. There are no palpable masses. There is no hepatosplenomegaly. There are normal bowel sounds in all quadrants. ?GU: Rectal deferred.   ?MUSCULOSKELETAL: Normal muscle strength and tone. No cyanosis or edema.   ?SKIN: Turgor is good and there are no pathologic skin lesions or ulcers. ?NEUROLOGIC: Motor and sensation is grossly normal. Cranial nerves are grossly intact. ?PSYCH:  Oriented to person, place and time. Affect is normal. ? ?Data Reviewed ? ?I have personally reviewed the patient's imaging, laboratory findings and medical records.   ? ?Assessment/Plan ?51 year old male with history of cholecystitis and cholelithiasis.  At this time there is no evidence of acute cholecystitis.  Difficult situation given the relatively recent non-STEMI and DES stent  placement.  I have discussed with interventional cardiology  ( Dr. Irish Lack and Dr. Marlou Porch) about the situation and they are basically two recommendations # 1 if there is urgency from the gallbladder perspective we ca

## 2021-05-14 NOTE — Progress Notes (Signed)
Patient ID: Jacob Baker, male   DOB: May 12, 1970, 51 y.o.   MRN: [id number] ?  ?HPI ?Jacob Baker is a 51 y.o. male seen in consultation at the request of Dr.Wohl.  He did have non-STEMI back in December 31, 2020 and he required cardiac cath with DES stent placed on both the diagonal and the RCA, widely patent left main.  He did develop angina and was placed on appropriate cardioprotective medication with improvement of symptoms.  Few days later came back to the hospital with abdominal pain and changes consistent with acute cholecystitis confirmed with ultrasound and HIDA scans ( personally reviwed).  This was antipain hospital and at that time given the acuteness of the coronary event decision was made to treat cholecystitis medically with antibiotics.  Patient responded appropriately.  More recently saw GI and there is concern for gallbladder issues in the near future.  He states that currently he has no abdominal pain or chest pain.  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  Denies any shortness of breath.  No fevers no chills. ?He is stopped smoking shortly after he had his last heart attack 4 months ago ? ?  ?HPI ?  ?    ?Past Medical History:  ?Diagnosis Date  ? (HFpEF) heart failure with preserved ejection fraction ([place])    ?  a. 12/2020 Echo: EF 65-70%, no rwma, mild LVH, nl RV fxn, triv MR.  ? CAD (coronary artery disease)    ?  a. 12/2020 NSTEMI/PCI: LM nl, LAD min irregs, D1 99 (2.0x12 Onyx Frontier DES), LCX 85p (small), RCA 80p/m (4.0x18 Onyx Frontier DES - staged following day), 60d, RPDA mod dzs. EF 50-55%.  ? Diabetes mellitus without complication ([place])    ? DVT (deep venous thrombosis) ([place]) 04/2017  ? GERD (gastroesophageal reflux disease)    ? History of migraine    ?  none in over 2 yrs  ? Hyperlipidemia    ? Hypertension    ? Syncope and collapse    ?  x1 - approx 2-3 yrs ago - dehydration  ?  ?  ?     ?Past Surgical History:  ?Procedure Laterality Date   ? COLONOSCOPY      ? COLONOSCOPY WITH PROPOFOL N/A 08/06/2017  ?  Procedure: COLONOSCOPY WITH PROPOFOL;  Surgeon: Lucilla Lame, MD;  Location: [facility];  Service: Endoscopy;  Laterality: N/A;  Diabetic - oral meds  ? CORONARY STENT INTERVENTION N/A 12/31/2020  ?  Procedure: CORONARY STENT INTERVENTION;  Surgeon: Belva Crome, MD;  Location: [facility];  Service: Cardiovascular;  Laterality: N/A;  ? CORONARY STENT INTERVENTION N/A 01/01/2021  ?  Procedure: CORONARY STENT INTERVENTION;  Surgeon: [name], MD;  Location: [facility];  Service: Cardiovascular;  Laterality: N/A;  ? ESOPHAGEAL DILATION   09/24/2016  ?  Procedure: ESOPHAGEAL DILATION;  Surgeon: Lucilla Lame, MD;  Location: [facility];  Service: Gastroenterology;;  ? ESOPHAGOGASTRODUODENOSCOPY (EGD) WITH PROPOFOL N/A 04/08/2015  ?  Procedure: ESOPHAGOGASTRODUODENOSCOPY (EGD) WITH PROPOFOL with dialation;  Surgeon: Lucilla Lame, MD;  Location: [facility];  Service: Endoscopy;  Laterality: N/A;  diabetic - oral meds  ? ESOPHAGOGASTRODUODENOSCOPY (EGD) WITH PROPOFOL N/A 09/24/2016  ?  Procedure: ESOPHAGOGASTRODUODENOSCOPY (EGD) WITH PROPOFOL WITH DILATION;  Surgeon: Lucilla Lame, MD;  Location: [facility];  Service: Gastroenterology;  Laterality: N/A;  Diabetic - oral meds  ?  FINGER AMPUTATION   2009  ?  partial removal left index finger   ? LEFT HEART CATH AND CORONARY ANGIOGRAPHY N/A 12/31/2020  ?  Procedure: LEFT HEART CATH AND CORONARY ANGIOGRAPHY;  Surgeon: Belva Crome, MD;  Location: [facility];  Service: Cardiovascular;  Laterality: N/A;  ? LEFT HEART CATH AND CORONARY ANGIOGRAPHY N/A 01/01/2021  ?  Procedure: LEFT HEART CATH AND CORONARY ANGIOGRAPHY;  Surgeon: [name], MD;  Location: [facility];  Service: Cardiovascular;  Laterality: N/A;  ? POLYPECTOMY   08/06/2017  ?  Procedure: POLYPECTOMY INTESTINAL;  Surgeon: Lucilla Lame, MD;  Location: [facility];   Service: Endoscopy;;  ?  ?  ?     ?Family History  ?Problem Relation Age of Onset  ? Hypertension Mother    ? Heart disease Father    ?      CAD  ? Hypertension Father    ? Diabetes Father    ? Hypertension Sister    ? Hypertension Brother    ? Hypertension Brother    ?  ?  ?Social History ?Social History  ?  ?     ?Tobacco Use  ? Smoking status: Former  ?    Packs/day: 2.00  ?    Years: 27.00  ?    Pack years: 54.00  ?    Types: Cigarettes  ?    Quit date: 12/30/2020  ?    Years since quitting: 0.3  ? Smokeless tobacco: Never  ?Vaping Use  ? Vaping Use: Never used  ?Substance Use Topics  ? Alcohol use: No  ? Drug use: No  ?  ?  ?No Known Allergies ?  ?      ?Current Outpatient Medications  ?Medication Sig Dispense Refill  ? acetaminophen (TYLENOL) 325 MG tablet Take 2 tablets (650 mg total) by mouth every 6 (six) hours as needed for mild pain or fever. 12 tablet 0  ? aspirin 81 MG EC tablet Take 1 tablet (81 mg total) by mouth daily with breakfast. Swallow whole. 90 tablet 3  ? dapagliflozin propanediol (FARXIGA) 5 MG TABS tablet Take 1 tablet (5 mg total) by mouth daily before breakfast. 90 tablet 1  ? glipiZIDE (GLUCOTROL) 10 MG tablet Take 10 mg by mouth daily before breakfast.      ? glucose blood (ONETOUCH VERIO) test strip Test sugars twice daily 50 each 6  ? lisinopril (ZESTRIL) 20 MG tablet Take 1 tablet (20 mg total) by mouth daily. 30 tablet 11  ? metFORMIN (GLUCOPHAGE) 850 MG tablet TAKE 1 TABLET(850 MG) BY MOUTH TWICE DAILY WITH A MEAL. 180 tablet 1  ? metoprolol tartrate (LOPRESSOR) 25 MG tablet Take 1 tablet (25 mg total) by mouth 2 (two) times daily. 60 tablet 11  ? nitroGLYCERIN (NITROSTAT) 0.4 MG SL tablet Place 1 tablet (0.4 mg total) under the tongue every 5 (five) minutes as needed for chest pain. 25 tablet 1  ? ONETOUCH DELICA LANCETS 48G MISC Test sugars twice daily 50 each 3  ? pantoprazole (PROTONIX) 40 MG tablet TAKE 1 TABLET(40 MG) BY MOUTH TWICE DAILY 180 tablet 0  ? polyethylene glycol  (MIRALAX) 17 g packet Take 17 g by mouth daily. (Patient taking differently: Take 17 g by mouth daily as needed for moderate constipation.) 30 each 1  ? rosuvastatin (CRESTOR) 40 MG tablet Take 40 mg by mouth daily.      ? ticagrelor (BRILINTA) 90 MG TABS tablet Take 1 tablet (  90 mg total) by mouth 2 (two) times daily. 60 tablet 11  ? vitamin B-12 (CYANOCOBALAMIN) 1000 MCG tablet Take 1,000 mcg by mouth daily.      ?  ?No current facility-administered medications for this visit.  ?  ?  ?  ?Review of Systems ?Full ROS  was asked and was negative except for the information on the HPI ?  ?Physical Exam ?Blood pressure 128/77, pulse 81, temperature 98.1 ?F (36.7 ?C), temperature source Oral, height '6\' 1"'$  (1.854 m), weight 195 lb 6.4 oz (88.6 kg), SpO2 97 %. ?CONSTITUTIONAL: NAD. ?EYES: Pupils are equal, round,  Sclera are non-icteric. ?EARS, NOSE, MOUTH AND THROAT:  The oral mucosa is pink and moist. Hearing is intact to voice. ?LYMPH NODES:  Lymph nodes in the neck are normal. ?RESPIRATORY:  Lungs are clear. There is normal respiratory effort, with equal breath sounds bilaterally, and without pathologic use of accessory muscles. ?CARDIOVASCULAR: Heart is regular without murmurs, gallops, or rubs. ?GI: The abdomen is  soft, nontender, and nondistended. There are no palpable masses. There is no hepatosplenomegaly. There are normal bowel sounds in all quadrants. ?GU: Rectal deferred.   ?MUSCULOSKELETAL: Normal muscle strength and tone. No cyanosis or edema.   ?SKIN: Turgor is good and there are no pathologic skin lesions or ulcers. ?NEUROLOGIC: Motor and sensation is grossly normal. Cranial nerves are grossly intact. ?PSYCH:  Oriented to person, place and time. Affect is normal. ?  ?Data Reviewed ?  ?I have personally reviewed the patient's imaging, laboratory findings and medical records.   ?  ?Assessment/Plan ?51 year old male with history of cholecystitis and cholelithiasis.  At this time there is no evidence of acute  cholecystitis.  Difficult situation given the relatively recent non-STEMI and DES stent placement.  I have discussed with interventional cardiology  ( Dr. Irish Lack and Dr. Marlou Porch) about the situation a

## 2021-05-14 NOTE — Patient Instructions (Addendum)
If you have any concerns or questions, please feel free to call our office. See follow up appointment below.  ? ? ?Minimally Invasive Cholecystectomy ? ?Minimally invasive cholecystectomy is surgery to remove the gallbladder. The gallbladder is a pear-shaped organ that lies beneath the liver on the right side of the body. The gallbladder stores bile, which is a fluid that helps the body digest fats. Cholecystectomy is often done to treat inflammation (irritation and swelling) of the gallbladder (cholecystitis). This condition is usually caused by a buildup of gallstones (cholelithiasis) in the gallbladder or when the fluid in the gall bladder becomes stagnant because gallstones get stuck in the ducts (tubes) and block the flow of bile. This can result in inflammation and pain. In severe cases, emergency surgery may be required. ?This procedure is done through small incisions in the abdomen, instead of one large incision. It is also called laparoscopic surgery. A thin scope with a camera (laparoscope) is inserted through one incision. Then surgical instruments are inserted through the other incisions. In some cases, a minimally invasive surgery may need to be changed to a surgery that is done through a larger incision. This is called open surgery. ?Tell a health care provider about: ?Any allergies you have. ?All medicines you are taking, including vitamins, herbs, eye drops, creams, and over-the-counter medicines. ?Any problems you or family members have had with anesthetic medicines. ?Any bleeding problems you have. ?Any surgeries you have had. ?Any medical conditions you have. ?Whether you are pregnant or may be pregnant. ?What are the risks? ?Generally, this is a safe procedure. However, problems may occur, including: ?Infection. ?Bleeding. ?Allergic reactions to medicines. ?Damage to nearby structures or organs. ?A gallstone remaining in the common bile duct. The common bile duct carries bile from the gallbladder  to the small intestine. ?A bile leak from the liver or cystic duct after your gallbladder is removed. ?What happens before the procedure? ?When to stop eating and drinking ?Follow instructions from your health care provider about what you may eat and drink before your procedure. These may include: ?8 hours before the procedure ?Stop eating most foods. Do not eat meat, fried foods, or fatty foods. ?Eat only light foods, such as toast or crackers. ?All liquids are okay except energy drinks and alcohol. ?6 hours before the procedure ?Stop eating. ?Drink only clear liquids, such as water, clear fruit juice, black coffee, plain tea, and sports drinks. ?Do not drink energy drinks or alcohol. ?2 hours before the procedure ?Stop drinking all liquids. ?You may be allowed to take medicines with small sips of water. ?If you do not follow your health care provider's instructions, your procedure may be delayed or canceled. ?Medicines ?Ask your health care provider about: ?Changing or stopping your regular medicines. This is especially important if you are taking diabetes medicines or blood thinners. ?Taking medicines such as aspirin and ibuprofen. These medicines can thin your blood. Do not take these medicines unless your health care provider tells you to take them. ?Taking over-the-counter medicines, vitamins, herbs, and supplements. ?General instructions ?If you will be going home right after the procedure, plan to have a responsible adult: ?Take you home from the hospital or clinic. You will not be allowed to drive. ?Care for you for the time you are told. ?Do not use any products that contain nicotine or tobacco for at least 4 weeks before the procedure. These products include cigarettes, chewing tobacco, and vaping devices, such as e-cigarettes. If you need help quitting, ask your  health care provider. ?Ask your health care provider: ?How your surgery site will be marked. ?What steps will be taken to help prevent  infection. These may include: ?Removing hair at the surgery site. ?Washing skin with a germ-killing soap. ?Taking antibiotic medicine. ?What happens during the procedure? ? ?An IV will be inserted into one of your veins. ?You will be given one or both of the following: ?A medicine to help you relax (sedative). ?A medicine to make you fall asleep (general anesthetic). ?Your surgeon will make several small incisions in your abdomen. ?The laparoscope will be inserted through one of the small incisions. The camera on the laparoscope will send images to a monitor in the operating room. This lets your surgeon see inside your abdomen. ?A gas will be pumped into your abdomen. This will expand your abdomen to give the surgeon more room to perform the surgery. ?Other tools that are needed for the procedure will be inserted through the other incisions. The gallbladder will be removed through one of the incisions. ?Your common bile duct may be examined. If stones are found in the common bile duct, they may be removed. ?After your gallbladder has been removed, the incisions will be closed with stitches (sutures), staples, or skin glue. ?Your incisions will be covered with a bandage (dressing). ?The procedure may vary among health care providers and hospitals. ?What happens after the procedure? ?Your blood pressure, heart rate, breathing rate, and blood oxygen level will be monitored until you leave the hospital or clinic. ?You will be given medicines as needed to control your pain. ?You may have a drain placed in the incision. The drain will be removed a day or two after the procedure. ?Summary ?Minimally invasive cholecystectomy, also called laparoscopic cholecystectomy, is surgery to remove the gallbladder using small incisions. ?Tell your health care provider about all the medical conditions you have and all the medicines you are taking for those conditions. ?Before the procedure, follow instructions about when to stop eating  and drinking and changing or stopping medicines. ?Plan to have a responsible adult care for you for the time you are told after you leave the hospital or clinic. ?This information is not intended to replace advice given to you by your health care provider. Make sure you discuss any questions you have with your health care provider. ?Document Revised: 07/16/2020 Document Reviewed: 07/16/2020 ?Elsevier Patient Education ? [place]. ? ? ?Cholelithiasis ? ?Cholelithiasis is a disease in which gallstones form in the gallbladder. The gallbladder is an organ that stores bile. Bile is a fluid that helps to digest fats. Gallstones begin as small crystals and can slowly grow into stones. They may cause no symptoms until they block the gallbladder duct, or cystic duct, when the gallbladder tightens (contracts) after food is eaten. This can cause pain and is known as a gallbladder attack, or biliary colic. ?There are two main types of gallstones: ?Cholesterol stones. These are the most common type of gallstone. These stones are made of hardened cholesterol and are usually yellow-green in color. Cholesterol is a fat-like substance that is made in the liver. ?Pigment stones. These are dark in color and are made of a red-yellow substance, called bilirubin,that forms when hemoglobin from red blood cells breaks down. ?What are the causes? ?This condition may be caused by an imbalance in the different parts that make bile. This can happen if the bile: ?Has too much bilirubin. This can happen in certain blood diseases, such as sickle cell anemia. ?  Has too much cholesterol. ?Does not have enough bile salts. These salts help the body absorb and digest fats. ?In some cases, this condition can also be caused by the gallbladder not emptying completely or often enough. This is common during pregnancy. ?What increases the risk? ?The following factors may make you more likely to develop this condition: ?Being male. ?Having multiple  pregnancies. Health care providers sometimes advise removing diseased gallbladders before future pregnancies. ?Eating a diet that is heavy in fried foods, fat, and refined carbohydrates, such as white bread

## 2021-05-16 ENCOUNTER — Encounter (HOSPITAL_COMMUNITY): Payer: BC Managed Care – PPO

## 2021-05-19 ENCOUNTER — Encounter (HOSPITAL_COMMUNITY): Payer: BC Managed Care – PPO

## 2021-05-19 ENCOUNTER — Other Ambulatory Visit: Payer: Self-pay

## 2021-05-21 ENCOUNTER — Encounter (HOSPITAL_COMMUNITY): Payer: BC Managed Care – PPO

## 2021-05-22 ENCOUNTER — Encounter: Payer: Self-pay | Admitting: Cardiology

## 2021-05-22 ENCOUNTER — Ambulatory Visit (INDEPENDENT_AMBULATORY_CARE_PROVIDER_SITE_OTHER): Payer: BC Managed Care – PPO | Admitting: Cardiology

## 2021-05-22 DIAGNOSIS — I25119 Atherosclerotic heart disease of native coronary artery with unspecified angina pectoris: Secondary | ICD-10-CM | POA: Diagnosis not present

## 2021-05-22 DIAGNOSIS — K819 Cholecystitis, unspecified: Secondary | ICD-10-CM | POA: Insufficient documentation

## 2021-05-22 DIAGNOSIS — Z01810 Encounter for preprocedural cardiovascular examination: Secondary | ICD-10-CM

## 2021-05-22 DIAGNOSIS — I252 Old myocardial infarction: Secondary | ICD-10-CM | POA: Diagnosis not present

## 2021-05-22 DIAGNOSIS — E782 Mixed hyperlipidemia: Secondary | ICD-10-CM

## 2021-05-22 DIAGNOSIS — I10 Essential (primary) hypertension: Secondary | ICD-10-CM

## 2021-05-22 DIAGNOSIS — Z87891 Personal history of nicotine dependence: Secondary | ICD-10-CM | POA: Diagnosis not present

## 2021-05-22 NOTE — Assessment & Plan Note (Signed)
Discussed potential cholecystectomy with surgery team, interventional cardiology team.  Recommend waiting for 6 months if possible.  Obviously if there is an emergency he may proceed.  Hold Brilinta for 5 days prior to surgery.  Continue with aspirin through surgery. ?

## 2021-05-22 NOTE — Progress Notes (Signed)
?Cardiology Office Note:   ? ?Date:  05/22/2021  ? ?ID:  Jacob Baker, DOB 1971/01/06, MRN [id number] ? ?PCP:  [name], MD ?  ?[facility] Providers ?Cardiologist:  [name], MD    ? ?Referring MD: [name], MD  ? ? ?History of Present Illness:   ? ?ROI Jacob Baker is a 51 y.o. male here for follow up CAD, discuss gallbladder surgery. ? ?CAD status post non-STEMI and diagonal/RCA stenting in early December 2022, hypertension, hyperlipidemia, diabetes, prior DVT, tobacco abuse, and GERD, who presents for follow-up after non-STEMI.  ?  ?December 30, 2020, he presented to [facility] with chest pain and ruled in for non-STEMI.  He was transferred to [facility] and underwent diagnostic catheterization revealing severe first diagonal, proximal left circumflex, and proximal/mid RCA disease.  The first diagonal was felt to be the culprit and this was successfully treated with drug-eluting stent.  EF was normal by ventriculogram and echocardiogram.   ? ?He had recurrent chest pain on December 7 and went back to the Cath Lab and underwent staged intervention of the mid right coronary artery.  He has residual 85% stenosis in the proximal left circumflex however, this is felt to be a small vessel.  He was discharged home on aspirin and Brilinta on December 8.   ? ?Unfortunately, he was seen back in the emergency department on December 9 with abdominal discomfort.  Troponin was downtrending.  CT of the abdomen was unremarkable and he was discharged from the ED.   ?  ?He was seen again in the emergency department on 12/12 with chest pain.  Troponin was 73 and downtrending from prior visit.  CT of the chest was negative for PE or dissection.  No acute findings on CT of the abdomen or pelvis.  Amlodipine was increased to 10 mg daily and he was placed on isosorbide mononitrate. He presented back to the emergency department on 12/13 with ongoing upper abdominal pain.  He was initially hypertensive.  Right  upper quadrant ultrasound showed gallbladder wall thickening with dependent sludge and probable tiny stones.  HIDA scan showed acute cholecystitis.  He was placed on intravenous Zosyn and seen by general surgery who felt that if symptoms persist in the outpatient setting, he may need placement of a cholecystectomy tube by interventional radiology. ?  ?Reports headache with Imdur. He is Imdur '30mg'$  daily. There have been days he has not taken it, but those days, his chest hurts. Asking about another medication for stable chest pain.  ? ?He is taking statin still, but can only tolerate the '10mg'$  daily. Patient quit smoking December 4th.  ? ?Overall he been doing fairly well.  He has noticed some easy bruising with Brilinta.  He is also noted some minor nosebleeds/blood on his tissue when blowing his nose from Brilinta. ? ? ?Past Medical History:  ?Diagnosis Date  ? (HFpEF) heart failure with preserved ejection fraction ([place])   ? a. 12/2020 Echo: EF 65-70%, no rwma, mild LVH, nl RV fxn, triv MR.  ? CAD (coronary artery disease)   ? a. 12/2020 NSTEMI/PCI: LM nl, LAD min irregs, D1 99 (2.0x12 Onyx Frontier DES), LCX 85p (small), RCA 80p/m (4.0x18 Onyx Frontier DES - staged following day), 60d, RPDA mod dzs. EF 50-55%.  ? Diabetes mellitus without complication ([place])   ? DVT (deep venous thrombosis) ([place]) 04/2017  ? GERD (gastroesophageal reflux disease)   ? History of migraine   ? none in over 2  yrs  ? Hyperlipidemia   ? Hypertension   ? Syncope and collapse   ? x1 - approx 2-3 yrs ago - dehydration  ? ? ?Past Surgical History:  ?Procedure Laterality Date  ? COLONOSCOPY    ? COLONOSCOPY WITH PROPOFOL N/A 08/06/2017  ? Procedure: COLONOSCOPY WITH PROPOFOL;  Surgeon: Lucilla Lame, MD;  Location: [place];  Service: Endoscopy;  Laterality: N/A;  Diabetic - oral meds  ? CORONARY STENT INTERVENTION N/A 12/31/2020  ? Procedure: CORONARY STENT INTERVENTION;  Surgeon: Belva Crome, MD;  Location: [place] [facility];   Service: Cardiovascular;  Laterality: N/A;  ? CORONARY STENT INTERVENTION N/A 01/01/2021  ? Procedure: CORONARY STENT INTERVENTION;  Surgeon: [name], MD;  Location: [place] [facility];  Service: Cardiovascular;  Laterality: N/A;  ? ESOPHAGEAL DILATION  09/24/2016  ? Procedure: ESOPHAGEAL DILATION;  Surgeon: Lucilla Lame, MD;  Location: [place];  Service: Gastroenterology;;  ? ESOPHAGOGASTRODUODENOSCOPY (EGD) WITH PROPOFOL N/A 04/08/2015  ? Procedure: ESOPHAGOGASTRODUODENOSCOPY (EGD) WITH PROPOFOL with dialation;  Surgeon: Lucilla Lame, MD;  Location: [place];  Service: Endoscopy;  Laterality: N/A;  diabetic - oral meds  ? ESOPHAGOGASTRODUODENOSCOPY (EGD) WITH PROPOFOL N/A 09/24/2016  ? Procedure: ESOPHAGOGASTRODUODENOSCOPY (EGD) WITH PROPOFOL WITH DILATION;  Surgeon: Lucilla Lame, MD;  Location: [place];  Service: Gastroenterology;  Laterality: N/A;  Diabetic - oral meds  ? FINGER AMPUTATION  2009  ? partial removal left index finger   ? LEFT HEART CATH AND CORONARY ANGIOGRAPHY N/A 12/31/2020  ? Procedure: LEFT HEART CATH AND CORONARY ANGIOGRAPHY;  Surgeon: Belva Crome, MD;  Location: [place] [facility];  Service: Cardiovascular;  Laterality: N/A;  ? LEFT HEART CATH AND CORONARY ANGIOGRAPHY N/A 01/01/2021  ? Procedure: LEFT HEART CATH AND CORONARY ANGIOGRAPHY;  Surgeon: [name], MD;  Location: [place] [facility];  Service: Cardiovascular;  Laterality: N/A;  ? POLYPECTOMY  08/06/2017  ? Procedure: POLYPECTOMY INTESTINAL;  Surgeon: Lucilla Lame, MD;  Location: [place];  Service: Endoscopy;;  ? ? ?Current Medications: ?Current Meds  ?Medication Sig  ? acetaminophen (TYLENOL) 325 MG tablet Take 2 tablets (650 mg total) by mouth every 6 (six) hours as needed for mild pain or fever.  ? aspirin 81 MG EC tablet Take 1 tablet (81 mg total) by mouth daily with breakfast. Swallow whole.  ? dapagliflozin propanediol (FARXIGA) 5 MG TABS tablet Take 1 tablet (5  mg total) by mouth daily before breakfast.  ? glipiZIDE (GLUCOTROL) 10 MG tablet Take 10 mg by mouth daily before breakfast.  ? glucose blood (ONETOUCH VERIO) test strip Test sugars twice daily  ? lisinopril (ZESTRIL) 20 MG tablet Take 1 tablet (20 mg total) by mouth daily.  ? metFORMIN (GLUCOPHAGE) 850 MG tablet TAKE 1 TABLET(850 MG) BY MOUTH TWICE DAILY WITH A MEAL.  ? metoprolol tartrate (LOPRESSOR) 25 MG tablet Take 1 tablet (25 mg total) by mouth 2 (two) times daily.  ? nitroGLYCERIN (NITROSTAT) 0.4 MG SL tablet Place 1 tablet (0.4 mg total) under the tongue every 5 (five) minutes as needed for chest pain.  ? ONETOUCH DELICA LANCETS 77O MISC Test sugars twice daily  ? pantoprazole (PROTONIX) 40 MG tablet TAKE 1 TABLET(40 MG) BY MOUTH TWICE DAILY  ? ranolazine (RANEXA) 500 MG 12 hr tablet Take 500 mg by mouth 2 (two) times daily.  ? rosuvastatin (CRESTOR) 40 MG tablet Take 40 mg by mouth daily.  ? ticagrelor (BRILINTA) 90 MG TABS tablet Take 1 tablet (  90 mg total) by mouth 2 (two) times daily.  ? vitamin B-12 (CYANOCOBALAMIN) 1000 MCG tablet Take 1,000 mcg by mouth daily.  ?  ? ?Allergies:   Patient has no known allergies.  ? ?Social History  ? ?Socioeconomic History  ? Marital status: Married  ?  Spouse name: Not on file  ? Number of children: Not on file  ? Years of education: Not on file  ? Highest education level: Not on file  ?Occupational History  ? Not on file  ?Tobacco Use  ? Smoking status: Former  ?  Packs/day: 2.00  ?  Years: 27.00  ?  Pack years: 54.00  ?  Types: Cigarettes  ?  Quit date: 12/30/2020  ?  Years since quitting: 0.3  ? Smokeless tobacco: Never  ?Vaping Use  ? Vaping Use: Never used  ?Substance and Sexual Activity  ? Alcohol use: No  ? Drug use: No  ? Sexual activity: Not on file  ?Other Topics Concern  ? Not on file  ?Social History Narrative  ? Not on file  ? ?Social Determinants of Health  ? ?Financial Resource Strain: Not on file  ?Food Insecurity: Not on file  ?Transportation Needs:  Not on file  ?Physical Activity: Not on file  ?Stress: Not on file  ?Social Connections: Not on file  ?  ? ?Family History: ?The patient's family history includes Diabetes in his father; Heart disease in his

## 2021-05-22 NOTE — Assessment & Plan Note (Signed)
[facility] 01/01/22 as above with stent in the RCA and mid diag branch, residual disease noted. He is not tolerating Imdur due to headache. When he doesn't take Imdur, however, he experiences chest pain. He is on amlodipine '10mg'$ . We stopped Imdur and tried Ranexa '500mg'$  BID.  ?Was also on double therapy with Coreg and Lopressor. Stopped the coreg and continued the Lopressor '25mg'$  BID. Continue DAPT with ASA and Brilinta. 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  Cardiac Rehab date was Feb 27, 2021.  ?? ?

## 2021-05-22 NOTE — Assessment & Plan Note (Signed)
Meds reviewed. Continue ?

## 2021-05-22 NOTE — Assessment & Plan Note (Signed)
Tolerated lower dose Crestor '10mg'$ . LDL goal < 55 ?

## 2021-05-22 NOTE — Assessment & Plan Note (Signed)
Dec 30 2020. As above. Normal EF. GDMT ?

## 2021-05-22 NOTE — Patient Instructions (Signed)
Medication Instructions:  Your physician recommends that you continue on your current medications as directed. Please refer to the Current Medication list given to you today.   Labwork: None today  Testing/Procedures: None today  Follow-Up: 6 months  Any Other Special Instructions Will Be Listed Below (If Applicable).  If you need a refill on your cardiac medications before your next appointment, please call your pharmacy.  

## 2021-05-22 NOTE — Assessment & Plan Note (Signed)
Quit smoking day of MI Dec 30 2020 ?

## 2021-05-23 ENCOUNTER — Encounter (HOSPITAL_COMMUNITY): Payer: BC Managed Care – PPO

## 2021-05-28 ENCOUNTER — Ambulatory Visit (INDEPENDENT_AMBULATORY_CARE_PROVIDER_SITE_OTHER): Payer: BC Managed Care – PPO | Admitting: Surgery

## 2021-05-28 ENCOUNTER — Encounter: Payer: Self-pay | Admitting: Surgery

## 2021-05-28 VITALS — BP 129/84 | HR 71 | Temp 98.3°F | Ht 73.0 in | Wt 192.0 lb

## 2021-05-28 DIAGNOSIS — K8020 Calculus of gallbladder without cholecystitis without obstruction: Secondary | ICD-10-CM

## 2021-05-28 NOTE — Patient Instructions (Signed)
For surgery you will need to stop your Brilinta for 5 full days before surgery. You will stay on your Aspirin.  ? ?You have requested to have your gallbladder removed. This will be done at [facility] with Dr. Dahlia Byes. ? ?You will most likely be out of work 1-2 weeks for this surgery.  ?If you have FMLA or disability paperwork that needs filled out you may drop this off at our office or this can be faxed to [phone]. ? ?You will return after your post-op appointment with a lifting restriction for approximately 4 more weeks. ? ?You will be able to eat anything you would like to following surgery. But, start by eating a bland diet and advance this as tolerated. The Gallbladder diet is below, please go as closely by this diet as possible prior to surgery to avoid any further attacks. ? ?Please see the (blue)pre-care form that you have been given today. Our surgery scheduler will call you to verify surgery date and to go over information.  ? ?If you have any questions, please call our office. ? ?Laparoscopic Cholecystectomy ?Laparoscopic cholecystectomy is surgery to remove the gallbladder. The gallbladder is located in the upper right part of the abdomen, behind the liver. It is a storage sac for bile, which is produced in the liver. Bile aids in the digestion and absorption of fats. Cholecystectomy is often done for inflammation of the gallbladder (cholecystitis). This condition is usually caused by a buildup of gallstones (cholelithiasis) in the gallbladder. Gallstones can block the flow of bile, and that can result in inflammation and pain. In severe cases, emergency surgery may be required. If emergency surgery is not required, you will have time to prepare for the procedure. ?Laparoscopic surgery is an alternative to open surgery. Laparoscopic surgery has a shorter recovery time. Your common bile duct may also need to be examined during the procedure. If stones are found in the common bile duct, they may  be removed. ?LET [facility] CARE PROVIDER KNOW ABOUT: ?Any allergies you have. ?All medicines you are taking, including vitamins, herbs, eye drops, creams, and over-the-counter medicines. ?Previous problems you or members of your family have had with the use of anesthetics. ?Any blood disorders you have. ?Previous surgeries you have had. ? ?Any medical conditions you have. ?RISKS AND COMPLICATIONS ?Generally, this is a safe procedure. However, problems may occur, including: ?Infection. ?Bleeding. ?Allergic reactions to medicines. ?Damage to other structures or organs. ?A stone remaining in the common bile duct. ?A bile leak from the cyst duct that is clipped when your gallbladder is removed. ?The need to convert to open surgery, which requires a larger incision in the abdomen. This may be necessary if your surgeon thinks that it is not safe to continue with a laparoscopic procedure. ?BEFORE THE PROCEDURE ?Ask your health care provider about: ?Changing or stopping your regular medicines. This is especially important if you are taking diabetes medicines or blood thinners. ?Taking medicines such as aspirin and ibuprofen. These medicines can thin your blood. Do not take these medicines before your procedure if your health care provider instructs you not to. ?Follow instructions from your health care provider about eating or drinking restrictions. ?Let your health care provider know if you develop a cold or an infection before surgery. ?Plan to have someone take you home after the procedure. ?Ask your health care provider how your surgical site will be marked or identified. ?You may be given antibiotic medicine to help prevent infection. ?PROCEDURE ?To reduce  your risk of infection: ?Your health care team will wash or sanitize their hands. ?Your skin will be washed with soap. ?An IV tube may be inserted into one of your veins. ?You will be given a medicine to make you fall asleep (general anesthetic). ?A breathing tube  will be placed in your mouth. ?The surgeon will make several small cuts (incisions) in your abdomen. ?A thin, lighted tube (laparoscope) that has a tiny camera on the end will be inserted through one of the small incisions. The camera on the laparoscope will send a picture to a TV screen (monitor) in the operating room. This will give the surgeon a good view inside your abdomen. ?A gas will be pumped into your abdomen. This will expand your abdomen to give the surgeon more room to perform the surgery. ?Other tools that are needed for the procedure will be inserted through the other incisions. The gallbladder will be removed through one of the incisions. ?After your gallbladder has been removed, the incisions will be closed with stitches (sutures), staples, or skin glue. ?Your incisions may be covered with a bandage (dressing). ?The procedure may vary among health care providers and hospitals. ?AFTER THE PROCEDURE ?Your blood pressure, heart rate, breathing rate, and blood oxygen level will be monitored often until the medicines you were given have worn off. ?You will be given medicines as needed to control your pain. ?  ?This information is not intended to replace advice given to you by your health care provider. Make sure you discuss any questions you have with your health care provider. ?  ?Document Released: 01/12/2005 Document Revised: 10/03/2014 Document Reviewed: 08/24/2012 ?Elsevier Interactive Patient Education ?2016 [place]. ? ? ?Low-Fat Diet for Gallbladder Conditions ?A low-fat diet can be helpful if you have pancreatitis or a gallbladder condition. With these conditions, your pancreas and gallbladder have trouble digesting fats. A healthy eating plan with less fat will help rest your pancreas and gallbladder and reduce your symptoms. ?WHAT DO I NEED TO KNOW ABOUT THIS DIET? ?Eat a low-fat diet. ?Reduce your fat intake to less than 20-30% of your total daily calories. This is less than 50-60 g of fat  per day. ?Remember that you need some fat in your diet. Ask your dietician what your daily goal should be. ?Choose nonfat and low-fat healthy foods. Look for the words "nonfat," "low fat," or "fat free." ?As a guide, look on the label and choose foods with less than 3 g of fat per serving. Eat only one serving. ?Avoid alcohol. ?Do not smoke. If you need help quitting, talk with your health care provider. ?Eat small frequent meals instead of three large heavy meals. ?WHAT FOODS CAN I EAT? ?Grains ?Include healthy grains and starches such as potatoes, wheat bread, fiber-rich cereal, and brown rice. Choose whole grain options whenever possible. In adults, whole grains should account for 45-65% of your daily calories.  ?Fruits and Vegetables ?Eat plenty of fruits and vegetables. Fresh fruits and vegetables add fiber to your diet. ?Meats and Other Protein Sources ?Eat lean meat such as chicken and pork. Trim any fat off of meat before cooking it. Eggs, fish, and beans are other sources of protein. In adults, these foods should account for 10-35% of your daily calories. ?Dairy ?Choose low-fat milk and dairy options. Dairy includes fat and protein, as well as calcium.  ?Fats and Oils ?Limit high-fat foods such as fried foods, sweets, baked goods, sugary drinks.  ?Other ?Creamy sauces and condiments, such as  mayonnaise, can add extra fat. Think about whether or not you need to use them, or use smaller amounts or low fat options. ?WHAT FOODS ARE NOT RECOMMENDED? ?High fat foods, such as: ?[employer]. ?Ice cream. ?[place] toast. ?Sweet rolls. ?Pizza. ?Cheese bread. ?Foods covered with batter, butter, creamy sauces, or cheese. ?Fried foods. ?Sugary drinks and desserts. ?Foods that cause gas or bloating ?  ?This information is not intended to replace advice given to you by your health care provider. Make sure you discuss any questions you have with your health care provider. ?  ?Document Released: 01/17/2013 Document Reviewed:  01/17/2013 ?Elsevier Interactive Patient Education ?2016 [place]. ? ? ?

## 2021-05-29 ENCOUNTER — Encounter: Payer: Self-pay | Admitting: Surgery

## 2021-05-29 ENCOUNTER — Telehealth: Payer: Self-pay | Admitting: Surgery

## 2021-05-29 NOTE — Telephone Encounter (Signed)
Left message for patient to call, please inform of the following regarding scheduled surgery:  ? ?Pre-Admission date/time, COVID Testing date and Surgery date. ? ?Surgery Date: 07/01/21 ?Preadmission Testing Date: 06/23/21 (phone 8a-1p) ?Covid Testing Date: Not needed.    ? ?Also patient will need to call at [phone], between 1-3:00pm the day before surgery, to find out what time to arrive for surgery.   ? ?

## 2021-05-29 NOTE — Progress Notes (Addendum)
Outpatient Surgical Follow Up ? ?05/29/2021 ? ?Jacob Baker is an 51 y.o. male.  ? ?Chief Complaint  ?Patient presents with  ? Follow-up  ?  Gallbladder  ? ? ?HPI: Jacob Baker has a history of cholecystitis and also non-STEMI back in December.  He does have a drug-eluting stents currently on aspirin and Brilinta.  He has completed his cardiology evaluation and they agree that safest thing for him is to wait 6 months the injury.  We can hold Brilinta for 5 days.  He is looking forward to his operation he still continues to have intermittent abdominal pain but is not severe and is not to the point that he needs to go to the emergency room.  No fevers no chills no evidence of biliary obstruction.  He is working and he has a fairly reasonable cardiovascular reserve ? ?Past Medical History:  ?Diagnosis Date  ? (HFpEF) heart failure with preserved ejection fraction ([place])   ? a. 12/2020 Echo: EF 65-70%, no rwma, mild LVH, nl RV fxn, triv MR.  ? CAD (coronary artery disease)   ? a. 12/2020 NSTEMI/PCI: LM nl, LAD min irregs, D1 99 (2.0x12 Onyx Frontier DES), LCX 85p (small), RCA 80p/m (4.0x18 Onyx Frontier DES - staged following day), 60d, RPDA mod dzs. EF 50-55%.  ? Diabetes mellitus without complication ([place])   ? DVT (deep venous thrombosis) ([place]) 04/2017  ? GERD (gastroesophageal reflux disease)   ? History of migraine   ? none in over 2 yrs  ? Hyperlipidemia   ? Hypertension   ? Syncope and collapse   ? x1 - approx 2-3 yrs ago - dehydration  ? ? ?Past Surgical History:  ?Procedure Laterality Date  ? COLONOSCOPY    ? COLONOSCOPY WITH PROPOFOL N/A 08/06/2017  ? Procedure: COLONOSCOPY WITH PROPOFOL;  Surgeon: Lucilla Lame, MD;  Location: [facility];  Service: Endoscopy;  Laterality: N/A;  Diabetic - oral meds  ? CORONARY STENT INTERVENTION N/A 12/31/2020  ? Procedure: CORONARY STENT INTERVENTION;  Surgeon: Belva Crome, MD;  Location: [facility];  Service: Cardiovascular;  Laterality: N/A;  ? CORONARY STENT  INTERVENTION N/A 01/01/2021  ? Procedure: CORONARY STENT INTERVENTION;  Surgeon: [name], MD;  Location: [facility];  Service: Cardiovascular;  Laterality: N/A;  ? ESOPHAGEAL DILATION  09/24/2016  ? Procedure: ESOPHAGEAL DILATION;  Surgeon: Lucilla Lame, MD;  Location: [facility];  Service: Gastroenterology;;  ? ESOPHAGOGASTRODUODENOSCOPY (EGD) WITH PROPOFOL N/A 04/08/2015  ? Procedure: ESOPHAGOGASTRODUODENOSCOPY (EGD) WITH PROPOFOL with dialation;  Surgeon: Lucilla Lame, MD;  Location: [facility];  Service: Endoscopy;  Laterality: N/A;  diabetic - oral meds  ? ESOPHAGOGASTRODUODENOSCOPY (EGD) WITH PROPOFOL N/A 09/24/2016  ? Procedure: ESOPHAGOGASTRODUODENOSCOPY (EGD) WITH PROPOFOL WITH DILATION;  Surgeon: Lucilla Lame, MD;  Location: [facility];  Service: Gastroenterology;  Laterality: N/A;  Diabetic - oral meds  ? FINGER AMPUTATION  2009  ? partial removal left index finger   ? LEFT HEART CATH AND CORONARY ANGIOGRAPHY N/A 12/31/2020  ? Procedure: LEFT HEART CATH AND CORONARY ANGIOGRAPHY;  Surgeon: Belva Crome, MD;  Location: [facility];  Service: Cardiovascular;  Laterality: N/A;  ? LEFT HEART CATH AND CORONARY ANGIOGRAPHY N/A 01/01/2021  ? Procedure: LEFT HEART CATH AND CORONARY ANGIOGRAPHY;  Surgeon: [name], MD;  Location: [facility];  Service: Cardiovascular;  Laterality: N/A;  ? POLYPECTOMY  08/06/2017  ? Procedure: POLYPECTOMY INTESTINAL;  Surgeon: Lucilla Lame, MD;  Location: [facility];  Service: Endoscopy;;  ? ? ?Family History  ?Problem Relation Age of Onset  ? Hypertension Mother   ? Heart disease Father   ?     CAD  ? Hypertension Father   ? Diabetes Father   ? Hypertension Sister   ? Hypertension Brother   ? Hypertension Brother   ? ? ?Social History:  reports that he quit smoking about 4 months ago. His smoking use included cigarettes. He has a 54.00 pack-year smoking history. He has never been exposed to tobacco smoke.  He has never used smokeless tobacco. He reports that he does not drink alcohol and does not use drugs. ? ?Allergies: No Known Allergies ? ?Medications reviewed. ? ? ? ?ROS ?Full ROS performed and is otherwise negative other than what is stated in HPI ? ? ?BP 129/84   Pulse 71   Temp 98.3 ?F (36.8 ?C)   Ht '6\' 1"'$  (1.854 m)   Wt 192 lb (87.1 kg)   SpO2 99%   BMI 25.33 kg/m?  ? ?Physical Exam ? ?CONSTITUTIONAL: NAD. ?EYES: Pupils are equal, round,  Sclera are non-icteric. ?EARS, NOSE, MOUTH AND THROAT:  The oral mucosa is pink and moist. Hearing is intact to voice. ?LYMPH NODES:  Lymph nodes in the neck are normal. ?RESPIRATORY:  Lungs are clear. There is normal respiratory effort, with equal breath sounds bilaterally, and without pathologic use of accessory muscles. ?CARDIOVASCULAR: Heart is regular without murmurs, gallops, or rubs. ?GI: The abdomen is  soft, nontender, and nondistended. There are no palpable masses. There is no hepatosplenomegaly. There are normal bowel sounds in all quadrants. ?GU: Rectal deferred.   ?MUSCULOSKELETAL: Normal muscle strength and tone. No cyanosis or edema.   ?SKIN: Turgor is good and there are no pathologic skin lesions or ulcers. ?NEUROLOGIC: Motor and sensation is grossly normal. Cranial nerves are grossly intact. ?PSYCH:  Oriented to person, place and time. Affect is normal. ?  ? ?Assessment/Plan: ?51 year old male with history of cholecystitis and current cholelithiasis with intermittent biliary colic.  In addition to this he does have significant history of non-STEMI with drug-eluting stent.  He is currently on Brilinta and aspirin.  Discussed with the patient and cardiology in detail.  We will hold off for 6 months after the stent was placed he may hold Brilinta for 5 days and continue aspirin.  I had an extensive discussion with him and his wife regarding the situation.  I do think that that is the safest thing.  If we were to have another episode of acute cholecystitis  we might be able to expedite things.  At this time we will wait for next month for his 33-monthanniversary since his heart attack ?I discussed the procedure in detail.  The patient was given eNeurosurgeon  We discussed the risks and benefits of a laparoscopic cholecystectomy and possible cholangiogram including, but not limited to bleeding, infection, injury to surrounding structures such as the intestine or liver, bile leak, retained gallstones, need to convert to an open procedure, prolonged diarrhea, blood clots such as  DVT, common bile duct injury, anesthesia risks, and possible need for additional procedures.  The likelihood of improvement in symptoms and return to the patient's normal status is good. We discussed the typical post-operative recovery course.  ?Please note that I spent 40 minutes in this encounter including personally reviewing his imaging studies, coordinating his care, placing orders, counseling the patient and performing appropriate documentation ? ? ?DCaroleen Hamman MD FACS ?General Surgeon  ?

## 2021-06-11 ENCOUNTER — Telehealth: Payer: Self-pay

## 2021-06-11 NOTE — Telephone Encounter (Signed)
LMTCB. Need to ask the pt if he requested a Dexcom G6 glucometer from a company called [employer].  ?

## 2021-06-12 NOTE — Telephone Encounter (Signed)
Placed form in quick sign folder.

## 2021-06-12 NOTE — Telephone Encounter (Signed)
Patient called office.  I read Jessica's message to patient.  Patient states the answer is "Yes", he did request a Dexcom G6 glucometer from a company called [employer].

## 2021-06-23 ENCOUNTER — Inpatient Hospital Stay: Admission: RE | Admit: 2021-06-23 | Payer: BC Managed Care – PPO | Source: Ambulatory Visit

## 2021-06-24 ENCOUNTER — Other Ambulatory Visit: Payer: Self-pay

## 2021-06-24 ENCOUNTER — Encounter
Admission: RE | Admit: 2021-06-24 | Discharge: 2021-06-24 | Disposition: A | Discharge status: Home / Self Care | Payer: BC Managed Care – PPO | Source: Ambulatory Visit | Attending: Surgery | Admitting: Surgery

## 2021-06-24 VITALS — Ht 73.0 in | Wt 190.0 lb

## 2021-06-24 DIAGNOSIS — I25119 Atherosclerotic heart disease of native coronary artery with unspecified angina pectoris: Secondary | ICD-10-CM

## 2021-06-24 DIAGNOSIS — Z8719 Personal history of other diseases of the digestive system: Secondary | ICD-10-CM

## 2021-06-24 DIAGNOSIS — E1165 Type 2 diabetes mellitus with hyperglycemia: Secondary | ICD-10-CM

## 2021-06-24 DIAGNOSIS — I252 Old myocardial infarction: Secondary | ICD-10-CM

## 2021-06-24 DIAGNOSIS — R079 Chest pain, unspecified: Secondary | ICD-10-CM

## 2021-06-24 NOTE — Patient Instructions (Addendum)
Your procedure is scheduled on: Tuesday 07/01/21 Report to the Registration Desk on the 1st floor of the [facility]. To find out your arrival time, please call [phone] between 1PM - 3PM on: Monday 06/30/21 If your arrival time is 6:00 am, do not arrive prior to that time as the [facility] entrance doors do not open until 6:00 am.  REMEMBER: Instructions that are not followed completely may result in serious medical risk, up to and including death; or upon the discretion of your surgeon and anesthesiologist your surgery may need to be rescheduled.  Do not eat food after midnight the night before surgery.  No gum chewing, lozengers or hard candies.  You may however, drink water up to 2 hours before you are scheduled to arrive for your surgery. Do not drink anything within 2 hours of your scheduled arrival time.  Type 2 diabetics should only drink water.  TAKE THESE MEDICATIONS THE MORNING OF SURGERY WITH A SIP OF WATER: metoprolol tartrate (LOPRESSOR) 25 MG tablet ranolazine (RANEXA) 500 MG 12 hr tablet  pantoprazole (PROTONIX) 40 MG tablet (take one the night before and one on the morning of surgery - helps to prevent nausea after surgery.)  Hold your metFORMIN (GLUCOPHAGE) 850 MG tablet 2 days prior to surgery. Last dose on Saturday 06/28/21.  Hold your dapagliflozin propanediol (FARXIGA) 5 MG TABS tablet for 3 days prior to surgery. Last dose on Friday 06/27/21.  Hold your ticagrelor (BRILINTA) 90 MG TABS tablet for 5 days prior to surgery. Last dose on Wednesday 06/25/21.  Continue Aspirin per Dr. Marlou Porch.  One week prior to surgery: Stop Anti-inflammatories (NSAIDS) such as Advil, Aleve, Ibuprofen, Motrin, Naproxen, Naprosyn and Aspirin based products such as Excedrin, Goodys Powder, BC Powder.  Stop taking your vitamin B-12 (CYANOCOBALAMIN) 1000 MCG tablet and ANY OVER THE COUNTER supplements until after surgery.  You may however, continue to take Tylenol if needed for pain up  until the day of surgery.  No Alcohol for 24 hours before or after surgery.  No Smoking including e-cigarettes for 24 hours prior to surgery.  No chewable tobacco products for at least 6 hours prior to surgery.  No nicotine patches on the day of surgery.  Do not use any "recreational" drugs for at least a week prior to your surgery.  Please be advised that the combination of cocaine and anesthesia may have negative outcomes, up to and including death. If you test positive for cocaine, your surgery will be cancelled.  On the morning of surgery brush your teeth with toothpaste and water, you may rinse your mouth with mouthwash if you wish. Do not swallow any toothpaste or mouthwash.  Use CHG wipes as directed on instruction sheet.  Do not wear jewelry.  Do not wear lotions, powders, or colognes.   Do not shave body from the neck down 48 hours prior to surgery just in case you cut yourself which could leave a site for infection.  Also, freshly shaved skin may become irritated if using the CHG soap.  Do not bring valuables to the hospital. [facility] is not responsible for any missing/lost belongings or valuables.   Notify your doctor if there is any change in your medical condition (cold, fever, infection).  Wear comfortable clothing (specific to your surgery type) to the hospital.  After surgery, you can help prevent lung complications by doing breathing exercises.  Take deep breaths and cough every 1-2 hours.   When coughing or sneezing, hold a pillow  firmly against your incision with both hands. This is called "splinting." Doing this helps protect your incision. It also decreases belly discomfort.  If you are being discharged the day of surgery, you will not be allowed to drive home. You will need a responsible adult (18 years or older) to drive you home and stay with you that night.   If you are taking public transportation, you will need to have a responsible adult (18 years  or older) with you. Please confirm with your physician that it is acceptable to use public transportation.   Please call the [facility] Dept. at [phone] if you have any questions about these instructions.  Surgery Visitation Policy:  Patients undergoing a surgery or procedure may have two family members or support persons with them as long as the person is not COVID-19 positive or experiencing its symptoms.   Inpatient Visitation:    Visiting hours are 7 a.m. to 8 p.m. Up to four visitors are allowed at one time in a patient room, including children. The visitors may rotate out with other people during the day. One designated support person (adult) may remain overnight.

## 2021-06-26 ENCOUNTER — Encounter: Payer: Self-pay | Admitting: Surgery

## 2021-06-26 NOTE — Progress Notes (Signed)
Perioperative Services  Pre-Admission/Anesthesia Testing Clinical Review  Date: 06/30/21  Patient Demographics:  Name: Jacob Baker DOB:   1970/04/22 MRN:   [id number]  Planned Surgical Procedure(s):    Case: [id number] Date/Time: 07/01/21 1413   Procedures:      XI ROBOTIC ASSISTED LAPAROSCOPIC CHOLECYSTECTOMY     INDOCYANINE GREEN FLUORESCENCE IMAGING (ICG)   Anesthesia type: General   Pre-op diagnosis: biliary colic   Location: [facility] OR ROOM 04 / [facility] FOR [facility]   Surgeons: Jules Husbands, MD   NOTE: Available PAT nursing documentation and vital signs have been reviewed. Clinical nursing staff has updated patient's PMH/PSHx, current medication list, and drug allergies/intolerances to ensure comprehensive history available to assist in medical decision making as it pertains to the aforementioned surgical procedure and anticipated anesthetic course. Extensive review of available clinical information performed. [place] PMH and PSHx updated with any diagnoses/procedures that  may have been inadvertently omitted during his intake with the pre-admission testing department's nursing staff.  Clinical Discussion:  Jacob Baker is a 51 y.o. male who is submitted for pre-surgical anesthesia review and clearance prior to him undergoing the above procedure. Patient is a Former Smoker (29 pack years; quit 12/2020). Pertinent PMH includes: CAD, NSTEMI, HFpEF, DVT, HTN, HLD, T2DM, GERD (on daily PPI).  Patient is followed by cardiology Marlou Porch, MD). He was last seen in the cardiology clinic on 05/22/2021; notes reviewed.  At the time of his clinic visit, patient reported to be doing "fairly well" from a cardiovascular perspective.  Patient with episodes of nonspecific chest pain with no associated shortness of breath.  He denied any PND, orthopnea, palpitations, significant peripheral edema, vertiginous symptoms, or presyncope/syncope.  Patient with past medical history significant for  cardiovascular diagnoses.  Patient suffered an NSTEMI on 12/30/2020.  He presented for [facility] at [facility], however was subsequently transferred to [facility] for ongoing care and management.  TTE performed on 12/31/2020 revealed a normal left ventricular systolic function with an EF of 65-70%.  There was mild LVH.  Trivial mitral valve regurgitation noted.  There was no evidence of a significant transvalvular gradient to suggest stenosis.  Diagnostic left heart catheterization performed on 12/31/2020 revealed multivessel CAD; 99% D1, mild diffuse disease in the LAD, 80-90% proximal LCx, 60% mid RCA, 60% distal RCA, and diffuse disease and the RPDA and LV branches.  There was mild anterior wall hypokinesis.  EF 50%.  LVEDP normal.  PCI performed placing a 2.0 x 12 mm Onyx frontier DES to the D1 lesion resulting in TIMI-3 flow.  Staged PCI was performed the following day (01/01/2021) placing a 4.0 x 18 mm Onyx Frontier DES to the proximal-mid RCA resulting in 0% residual stenosis and TIMI-3 flow.  Following recent NSTEMI and PCI, patient remains on daily DAPT therapy (ASA + ticagrelor.  Patient reported to be compliant with therapy with no evidence or reports of GI bleeding.  He is noted increased bruising on the ticagrelor.  Patient has been prescribed scheduled nitrate (ranolazine), however he has not been compliant with therapy due to associated headaches.  At the time of his clinic visit, he advised cardiology that he had missed several days of this medication, however when he missed medication, he experienced chest pain.  Additionally, patient has a supply of sublingual nitroglycerin to use on a as needed basis; denied use.  Blood pressure well controlled at 128/80 on currently prescribed ACEi, beta-blocker, and nitrate therapies.  He has been prescribed  rosuvastatin for his HLD and further ASCVD prevention, however patient only taking partial dose.  T2DM well-controlled on currently  prescribed regimen; last HgbA1c was 4.9% when checked on 03/28/2021.  Of note, patient is on an SGLT2i (dapagliflozin) in the setting of diabetes and known HFpEF. Functional capacity, as defined by DASI, is documented as being >/= 4 METS.  No changes were made to his medication regimen.  Patient follow-up with outpatient cardiology in 6 months or sooner if needed.  Jacob Baker is scheduled for an ROBOTIC ASSISTED LAPAROSCOPIC CHOLECYSTECTOMY on 07/01/2021 with Dr. Caroleen Hamman, MD. Given patient's past medical history significant for cardiovascular diagnoses, presurgical cardiac clearance was sought by the performing surgeon's office and PAT team.  Cardiology recommended patient completing 6 months of DAPT therapy prior to proceeding with cholecystectomy.  Procedure has been deferred to allow for cardiology recommendations.  Surgeon has spoken directly with cardiology provider and subsequent clearance has been issued. Per cardiology, "based ACC/AHA guidelines, the patient's past medical history, and the amount of time since his last clinic visit, this patient would be at an overall ACCEPTABLE risk for the planned procedure without further cardiovascular testing or intervention at this time".  As previously mentioned, patient is on daily DAPT therapy.  He has been instructed on recommendations from his cardiologist for holding his ticagrelor for 5 days prior to procedure with plans to restart as soon as postoperatively./Primary attending surgeon.  Patient is aware that his last dose of ticagrelor should be on 06/25/2021.  The patient will continue his daily low-dose ASA throughout the perioperative course.  Patient denies previous perioperative complications with anesthesia in the past. In review of the available records, it is noted that patient underwent a general anesthetic course here at [facility] (ASA III) in 07/2017 without documented complications.      06/24/2021     2:38 PM 05/28/2021    3:49 PM 05/22/2021    7:56 AM  Vitals with BMI  Height '6\' 1"'$  '6\' 1"'$  '6\' 1"'$   Weight 190 lbs 192 lbs 195 lbs  BMI 25.07 21.30 86.57  Systolic  846 962  Diastolic  84 80  Pulse  71 66    Providers/Specialists:   NOTE: Primary physician provider listed below. Patient may have been seen by APP or partner within same practice.   PROVIDER ROLE / SPECIALTY LAST Suszanne Finch, MD General Surgery (Surgeon) 05/28/2021  [name], MD Primary Care Provider 02/20/2021  Candee Furbish, MD Cardiology 05/22/2021   Allergies:  Patient has no known allergies.  Current Home Medications:   No current facility-administered medications for this encounter.    acetaminophen (TYLENOL) 325 MG tablet   aspirin 81 MG EC tablet   dapagliflozin propanediol (FARXIGA) 5 MG TABS tablet   glipiZIDE (GLUCOTROL) 10 MG tablet   glucose blood (ONETOUCH VERIO) test strip   lisinopril (ZESTRIL) 20 MG tablet   magnesium hydroxide (MILK OF MAGNESIA) 400 MG/5ML suspension   metFORMIN (GLUCOPHAGE) 850 MG tablet   metoprolol tartrate (LOPRESSOR) 25 MG tablet   nitroGLYCERIN (NITROSTAT) 0.4 MG SL tablet   ONETOUCH DELICA LANCETS 95M MISC   pantoprazole (PROTONIX) 40 MG tablet   ranolazine (RANEXA) 500 MG 12 hr tablet   rosuvastatin (CRESTOR) 40 MG tablet   ticagrelor (BRILINTA) 90 MG TABS tablet   vitamin B-12 (CYANOCOBALAMIN) 1000 MCG tablet   History:   Past Medical History:  Diagnosis Date   (HFpEF) heart failure with preserved ejection fraction (  [place])    a. 12/2020 Echo: EF 65-70%, no rwma, mild LVH, nl RV fxn, triv MR.   CAD (coronary artery disease)    a. 12/2020 NSTEMI/PCI: LM nl, LAD min irregs, D1 99 (2.0x12 Onyx Frontier DES), LCX 85p (small), RCA 80p/m (4.0x18 Onyx Frontier DES - staged following day), 60d, RPDA mod dzs. EF 50-55%.   DVT (deep venous thrombosis) ([place]) 04/2017   GERD (gastroesophageal reflux disease)    History of migraine    Hyperlipidemia     Hypertension    Long term current use of antithrombotics/antiplatelets    a.) on daily DAPT therapy (ticagrelor + ASA)   Non-STEMI (non-ST elevated myocardial infarction) ([place]) 12/30/2020   Syncope and collapse    a.) single episode; related to dehydration   T2DM (type 2 diabetes mellitus) ([place])    Past Surgical History:  Procedure Laterality Date   COLONOSCOPY     COLONOSCOPY WITH PROPOFOL N/A 08/06/2017   Procedure: COLONOSCOPY WITH PROPOFOL;  Surgeon: Lucilla Lame, MD;  Location: [place];  Service: Endoscopy;  Laterality: N/A;  Diabetic - oral meds   CORONARY STENT INTERVENTION N/A 12/31/2020   Procedure: CORONARY STENT INTERVENTION;  Surgeon: Belva Crome, MD;  Location: [place] [facility];  Service: Cardiovascular;  Laterality: N/A;   CORONARY STENT INTERVENTION N/A 01/01/2021   Procedure: CORONARY STENT INTERVENTION;  Surgeon: [name], MD;  Location: [place] [facility];  Service: Cardiovascular;  Laterality: N/A;   ESOPHAGEAL DILATION  09/24/2016   Procedure: ESOPHAGEAL DILATION;  Surgeon: Lucilla Lame, MD;  Location: [place];  Service: Gastroenterology;;   ESOPHAGOGASTRODUODENOSCOPY (EGD) WITH PROPOFOL N/A 04/08/2015   Procedure: ESOPHAGOGASTRODUODENOSCOPY (EGD) WITH PROPOFOL with dialation;  Surgeon: Lucilla Lame, MD;  Location: [place];  Service: Endoscopy;  Laterality: N/A;  diabetic - oral meds   ESOPHAGOGASTRODUODENOSCOPY (EGD) WITH PROPOFOL N/A 09/24/2016   Procedure: ESOPHAGOGASTRODUODENOSCOPY (EGD) WITH PROPOFOL WITH DILATION;  Surgeon: Lucilla Lame, MD;  Location: [place];  Service: Gastroenterology;  Laterality: N/A;  Diabetic - oral meds   FINGER AMPUTATION  2009   partial removal left index finger    LEFT HEART CATH AND CORONARY ANGIOGRAPHY N/A 12/31/2020   Procedure: LEFT HEART CATH AND CORONARY ANGIOGRAPHY;  Surgeon: Belva Crome, MD;  Location: [place] [facility];  Service: Cardiovascular;  Laterality: N/A;    LEFT HEART CATH AND CORONARY ANGIOGRAPHY N/A 01/01/2021   Procedure: LEFT HEART CATH AND CORONARY ANGIOGRAPHY;  Surgeon: [name], MD;  Location: [place] [facility];  Service: Cardiovascular;  Laterality: N/A;   POLYPECTOMY  08/06/2017   Procedure: POLYPECTOMY INTESTINAL;  Surgeon: Lucilla Lame, MD;  Location: [place];  Service: Endoscopy;;   Family History  Problem Relation Age of Onset   Hypertension Mother    Heart disease Father        CAD   Hypertension Father    Diabetes Father    Hypertension Sister    Hypertension Brother    Hypertension Brother    Social History   Tobacco Use   Smoking status: Former    Packs/day: 2.00    Years: 27.00    Pack years: 54.00    Types: Cigarettes    Quit date: 12/30/2020    Years since quitting: 0.4    Passive exposure: Never   Smokeless tobacco: Never  Vaping Use   Vaping Use: Never used  Substance Use Topics   Alcohol use: No   Drug use: No  Pertinent Clinical Results:  LABS: Labs reviewed: Acceptable for surgery.  Hospital Outpatient Visit on 06/30/2021  Component Date Value Ref Range Status   Hgb A1c MFr Bld 06/30/2021 5.7 (H)  4.8 - 5.6 % Final   Comment: REPEATED TO VERIFY (NOTE) Pre diabetes:          5.7%-6.4%  Diabetes:              >6.4%  Glycemic control for   <7.0% adults with diabetes    Mean Plasma Glucose 06/30/2021 116.89  mg/dL Final   Performed at [facility] [address], [place], [place] [address]   Sodium 06/30/2021 140  135 - 145 mmol/L Final   Potassium 06/30/2021 4.3  3.5 - 5.1 mmol/L Final   Chloride 06/30/2021 108  98 - 111 mmol/L Final   CO2 06/30/2021 25  22 - 32 mmol/L Final   Glucose, Bld 06/30/2021 188 (H)  70 - 99 mg/dL Final   Glucose reference range applies only to samples taken after fasting for at least 8 hours.   BUN 06/30/2021 8  6 - 20 mg/dL Final   Creatinine, Ser 06/30/2021 0.71  0.61 - 1.24 mg/dL Final   Calcium 06/30/2021 9.1  8.9 - 10.3 mg/dL  Final   GFR, Estimated 06/30/2021 >60  >60 mL/min Final   Comment: (NOTE) Calculated using the CKD-EPI Creatinine Equation (2021)    Anion gap 06/30/2021 7  5 - 15 Final   Performed at [facility], [address] [place], [place] [address]   WBC 06/30/2021 5.4  4.0 - 10.5 K/uL Final   RBC 06/30/2021 3.80 (L)  4.22 - 5.81 MIL/uL Final   Hemoglobin 06/30/2021 10.5 (L)  13.0 - 17.0 g/dL Final   HCT 06/30/2021 30.6 (L)  39.0 - 52.0 % Final   MCV 06/30/2021 80.5  80.0 - 100.0 fL Final   MCH 06/30/2021 27.6  26.0 - 34.0 pg Final   MCHC 06/30/2021 34.3  30.0 - 36.0 g/dL Final   RDW 06/30/2021 13.3  11.5 - 15.5 % Final   Platelets 06/30/2021 207  150 - 400 K/uL Final   nRBC 06/30/2021 0.0  0.0 - 0.2 % Final   Performed at [facility], [address] [place], [place] [address]    ECG: Date: 06/30/2021 Time ECG obtained: 0926 AM Rate: 58 bpm Rhythm:  Sinus bradycardia with 1st degree AV block Axis (leads I and aVF): Normal Intervals: PR 232 ms. QRS 86 ms. QTc 412 ms. ST segment and T wave changes: No evidence of acute ST segment elevation or depression Comparison: Similar to previous tracing obtained on 01/07/2021   IMAGING / PROCEDURES: CORONARY STENT INTERVENTION performed on 01/01/2021 Prox Cx lesion is 85% stenosed.  This was a small vessel that was diffusely diseased. Prox RCA to Mid RCA lesion is 80% stenosed. A drug-eluting stent was successfully placed using a STENT ONYX FRONTIER 4.0 x 62m, post-dilated to 4.5 mm. Post intervention, there is a 0% residual stenosis. Dist RCA lesion is 50% stenosed. Diagonal stent was widely patent LV end diastolic pressure is normal. There is no aortic valve stenosis. Successful PCI of RCA.  He needs aggressive secondary prevention, smoking cessation.  Would consider changing Plavix to Brilinta since he had ACS.   LEFT HEART CATHETERIZATION AND CORONARY ANGIOGRAPHY performed on 12/31/2020 High-grade stenosis in the  large mid diagonal branch 99% reduced to 0% with TIMI-3 flow using a 2.0 x 12 Onyx frontier.  Chest discomfort was  dissimilar to presenting complaint. Left main is normal LAD has mild diffuse disease Circumflex contains 80 to 90% proximal stenosis.  The vessel is diffusely diseased and gives origin to 2-2 small obtuse marginal branches.  If symptoms continue, perhaps this proximal lesion should be stented. RCA has a "rosary bead "appearance.  It is a large vessel.  There is 60% mid, 60% distal, diffuse disease in the PDA and LV branches. Left ventriculography demonstrated mid anterior wall hypokinesis.  EF 50%.  LVEDP is normal. RECOMMENDATIONS: Aggressive risk factor modification. Smoking cessation. Monitor for recurrent symptoms and consider PCI of circumflex if symptoms do not abate with PCI of this moderate-sized diagonal branch     TRANSTHORACIC ECHOCARDIOGRAM performed on 12/31/2020 Normal left ventricular systolic function with EF 60 to 65% No regional wall motion abnormalities Mild left ventricular hypertrophy RVSF normal Tricuspid regurgitation signal was inadequate for assessing PA pressure Trivial mitral valve regurgitation Normal gradients; no valvular stenosis  Impression and Plan:  Jacob Baker has been referred for pre-anesthesia review and clearance prior to him undergoing the planned anesthetic and procedural courses. Available labs, pertinent testing, and imaging results were personally reviewed by me. This patient has been appropriately cleared by cardiology with an overall ACCEPTABLE risk of significant perioperative cardiovascular complications.  Based on clinical review performed today (06/30/21), barring any significant acute changes in the patient's overall condition, it is anticipated that he will be able to proceed with the planned surgical intervention. Any acute changes in clinical condition may necessitate his procedure being postponed and/or cancelled. Patient  will meet with anesthesia team (MD and/or CRNA) on the day of his procedure for preoperative evaluation/assessment. Questions regarding anesthetic course will be fielded at that time.   Pre-surgical instructions were reviewed with the patient during his PAT appointment and questions were fielded by PAT clinical staff. Patient was advised that if any questions or concerns arise prior to his procedure then he should return a call to PAT and/or his surgeon's office to discuss.  Honor Loh, MSN, APRN, FNP-C, CEN [facility]  Peri-operative Services Nurse Practitioner Phone: [phone] Fax: [phone] 06/30/21 2:13 PM  NOTE: This note has been prepared using Dragon dictation software. Despite my best ability to proofread, there is always the potential that unintentional transcriptional errors may still occur from this process.

## 2021-06-30 ENCOUNTER — Encounter: Payer: Self-pay | Admitting: Urgent Care

## 2021-06-30 ENCOUNTER — Encounter
Admission: RE | Admit: 2021-06-30 | Discharge: 2021-06-30 | Disposition: A | Discharge status: Home / Self Care | Payer: BC Managed Care – PPO | Source: Ambulatory Visit | Attending: Surgery | Admitting: Surgery

## 2021-06-30 DIAGNOSIS — I25119 Atherosclerotic heart disease of native coronary artery with unspecified angina pectoris: Secondary | ICD-10-CM | POA: Insufficient documentation

## 2021-06-30 DIAGNOSIS — F419 Anxiety disorder, unspecified: Secondary | ICD-10-CM | POA: Diagnosis not present

## 2021-06-30 DIAGNOSIS — Z8719 Personal history of other diseases of the digestive system: Secondary | ICD-10-CM | POA: Insufficient documentation

## 2021-06-30 DIAGNOSIS — Z955 Presence of coronary angioplasty implant and graft: Secondary | ICD-10-CM | POA: Diagnosis not present

## 2021-06-30 DIAGNOSIS — K82A1 Gangrene of gallbladder in cholecystitis: Secondary | ICD-10-CM | POA: Diagnosis not present

## 2021-06-30 DIAGNOSIS — Z7902 Long term (current) use of antithrombotics/antiplatelets: Secondary | ICD-10-CM | POA: Diagnosis not present

## 2021-06-30 DIAGNOSIS — E1165 Type 2 diabetes mellitus with hyperglycemia: Secondary | ICD-10-CM | POA: Insufficient documentation

## 2021-06-30 DIAGNOSIS — K219 Gastro-esophageal reflux disease without esophagitis: Secondary | ICD-10-CM | POA: Diagnosis not present

## 2021-06-30 DIAGNOSIS — Z86718 Personal history of other venous thrombosis and embolism: Secondary | ICD-10-CM | POA: Diagnosis not present

## 2021-06-30 DIAGNOSIS — R079 Chest pain, unspecified: Secondary | ICD-10-CM | POA: Insufficient documentation

## 2021-06-30 DIAGNOSIS — I110 Hypertensive heart disease with heart failure: Secondary | ICD-10-CM | POA: Diagnosis not present

## 2021-06-30 DIAGNOSIS — I252 Old myocardial infarction: Secondary | ICD-10-CM | POA: Insufficient documentation

## 2021-06-30 DIAGNOSIS — E119 Type 2 diabetes mellitus without complications: Secondary | ICD-10-CM | POA: Diagnosis not present

## 2021-06-30 DIAGNOSIS — Z87891 Personal history of nicotine dependence: Secondary | ICD-10-CM | POA: Diagnosis not present

## 2021-06-30 DIAGNOSIS — I440 Atrioventricular block, first degree: Secondary | ICD-10-CM | POA: Diagnosis not present

## 2021-06-30 DIAGNOSIS — Z01818 Encounter for other preprocedural examination: Secondary | ICD-10-CM | POA: Insufficient documentation

## 2021-06-30 DIAGNOSIS — I2510 Atherosclerotic heart disease of native coronary artery without angina pectoris: Secondary | ICD-10-CM | POA: Diagnosis not present

## 2021-06-30 DIAGNOSIS — I5032 Chronic diastolic (congestive) heart failure: Secondary | ICD-10-CM | POA: Diagnosis not present

## 2021-06-30 LAB — HEMOGLOBIN A1C
Hgb A1c MFr Bld: 5.7 % — ABNORMAL HIGH (ref 4.8–5.6)
Mean Plasma Glucose: 116.89 mg/dL

## 2021-06-30 LAB — BASIC METABOLIC PANEL
Anion gap: 7 (ref 5–15)
BUN: 8 mg/dL (ref 6–20)
CO2: 25 mmol/L (ref 22–32)
Calcium: 9.1 mg/dL (ref 8.9–10.3)
Chloride: 108 mmol/L (ref 98–111)
Creatinine, Ser: 0.71 mg/dL (ref 0.61–1.24)
GFR, Estimated: >60 mL/min (ref >60)
Glucose, Bld: 188 mg/dL — ABNORMAL HIGH (ref 70–99)
Potassium: 4.3 mmol/L (ref 3.5–5.1)
Sodium: 140 mmol/L (ref 135–145)

## 2021-06-30 LAB — CBC
HCT: 30.6 % — ABNORMAL LOW (ref 39.0–52.0)
Hemoglobin: 10.5 g/dL — ABNORMAL LOW (ref 13.0–17.0)
MCH: 27.6 pg (ref 26.0–34.0)
MCHC: 34.3 g/dL (ref 30.0–36.0)
MCV: 80.5 fL (ref 80.0–100.0)
Platelets: 207 10*3/uL (ref 150–400)
RBC: 3.8 MIL/uL — ABNORMAL LOW (ref 4.22–5.81)
RDW: 13.3 % (ref 11.5–15.5)
WBC: 5.4 10*3/uL (ref 4.0–10.5)
nRBC: 0 % (ref 0.0–0.2)

## 2021-06-30 MED ORDER — CEFAZOLIN SODIUM-DEXTROSE 2-4 GM/100ML-% IV SOLN
2.0000 g | INTRAVENOUS | Status: AC
  Administered 2021-07-01: 2 g via INTRAVENOUS

## 2021-06-30 MED ORDER — CELECOXIB 200 MG PO CAPS: 200.0000 mg | ORAL_CAPSULE | ORAL | Status: AC

## 2021-06-30 MED ORDER — CHLORHEXIDINE GLUCONATE CLOTH 2 % EX PADS: 6.0000 | MEDICATED_PAD | Freq: Once | CUTANEOUS | Status: DC

## 2021-06-30 MED ORDER — INDOCYANINE GREEN 25 MG IV SOLR
5.0000 mg | Freq: Once | INTRAVENOUS | Status: AC
  Administered 2021-07-01: 5 mg via INTRAVENOUS
  Filled 2021-06-30: qty 2

## 2021-06-30 MED ORDER — GABAPENTIN 300 MG PO CAPS: 300.0000 mg | ORAL_CAPSULE | ORAL | Status: AC

## 2021-06-30 MED ORDER — ORAL CARE MOUTH RINSE: 15.0000 mL | Freq: Once | OROMUCOSAL | Status: AC

## 2021-06-30 MED ORDER — SODIUM CHLORIDE 0.9 % IV SOLN: INTRAVENOUS | Status: DC

## 2021-06-30 MED ORDER — CHLORHEXIDINE GLUCONATE 0.12 % MT SOLN: 15.0000 mL | Freq: Once | OROMUCOSAL | Status: AC

## 2021-06-30 MED ORDER — ACETAMINOPHEN 500 MG PO TABS: 1000.0000 mg | ORAL_TABLET | ORAL | Status: AC

## 2021-07-01 ENCOUNTER — Ambulatory Visit: Payer: BC Managed Care – PPO | Admitting: Urgent Care

## 2021-07-01 ENCOUNTER — Other Ambulatory Visit: Payer: Self-pay

## 2021-07-01 ENCOUNTER — Encounter
Admission: RE | Disposition: A | Discharge status: Home / Self Care | Payer: Self-pay | Source: Home / Self Care | Attending: Surgery

## 2021-07-01 ENCOUNTER — Ambulatory Visit
Admission: RE | Admit: 2021-07-01 | Discharge: 2021-07-01 | Disposition: A | Discharge status: Home / Self Care | Payer: BC Managed Care – PPO | Attending: Surgery | Admitting: Surgery

## 2021-07-01 ENCOUNTER — Encounter: Payer: Self-pay | Admitting: Surgery

## 2021-07-01 DIAGNOSIS — I2510 Atherosclerotic heart disease of native coronary artery without angina pectoris: Secondary | ICD-10-CM | POA: Insufficient documentation

## 2021-07-01 DIAGNOSIS — E782 Mixed hyperlipidemia: Secondary | ICD-10-CM | POA: Diagnosis not present

## 2021-07-01 DIAGNOSIS — K219 Gastro-esophageal reflux disease without esophagitis: Secondary | ICD-10-CM | POA: Insufficient documentation

## 2021-07-01 DIAGNOSIS — E119 Type 2 diabetes mellitus without complications: Secondary | ICD-10-CM | POA: Diagnosis not present

## 2021-07-01 DIAGNOSIS — I110 Hypertensive heart disease with heart failure: Secondary | ICD-10-CM | POA: Insufficient documentation

## 2021-07-01 DIAGNOSIS — Z955 Presence of coronary angioplasty implant and graft: Secondary | ICD-10-CM | POA: Insufficient documentation

## 2021-07-01 DIAGNOSIS — Z87891 Personal history of nicotine dependence: Secondary | ICD-10-CM | POA: Diagnosis not present

## 2021-07-01 DIAGNOSIS — K8050 Calculus of bile duct without cholangitis or cholecystitis without obstruction: Secondary | ICD-10-CM | POA: Diagnosis not present

## 2021-07-01 DIAGNOSIS — K811 Chronic cholecystitis: Secondary | ICD-10-CM | POA: Diagnosis not present

## 2021-07-01 DIAGNOSIS — E1165 Type 2 diabetes mellitus with hyperglycemia: Secondary | ICD-10-CM

## 2021-07-01 DIAGNOSIS — I252 Old myocardial infarction: Secondary | ICD-10-CM | POA: Diagnosis not present

## 2021-07-01 DIAGNOSIS — K8020 Calculus of gallbladder without cholecystitis without obstruction: Secondary | ICD-10-CM

## 2021-07-01 DIAGNOSIS — F419 Anxiety disorder, unspecified: Secondary | ICD-10-CM | POA: Diagnosis not present

## 2021-07-01 DIAGNOSIS — I440 Atrioventricular block, first degree: Secondary | ICD-10-CM | POA: Insufficient documentation

## 2021-07-01 DIAGNOSIS — Z86718 Personal history of other venous thrombosis and embolism: Secondary | ICD-10-CM | POA: Insufficient documentation

## 2021-07-01 DIAGNOSIS — I5032 Chronic diastolic (congestive) heart failure: Secondary | ICD-10-CM | POA: Diagnosis not present

## 2021-07-01 DIAGNOSIS — Z7902 Long term (current) use of antithrombotics/antiplatelets: Secondary | ICD-10-CM | POA: Insufficient documentation

## 2021-07-01 DIAGNOSIS — K82A1 Gangrene of gallbladder in cholecystitis: Secondary | ICD-10-CM | POA: Insufficient documentation

## 2021-07-01 HISTORY — DX: Long term (current) use of antithrombotics/antiplatelets: Z79.02

## 2021-07-01 HISTORY — DX: Type 2 diabetes mellitus without complications: E11.9

## 2021-07-01 LAB — GLUCOSE, CAPILLARY
Glucose-Capillary: 227 mg/dL — ABNORMAL HIGH (ref 70–99)
Glucose-Capillary: 265 mg/dL — ABNORMAL HIGH (ref 70–99)

## 2021-07-01 SURGERY — CHOLECYSTECTOMY, ROBOT-ASSISTED, LAPAROSCOPIC
Anesthesia: General | Site: Abdomen

## 2021-07-01 MED ORDER — OXYCODONE HCL 5 MG PO TABS
5.0000 mg | ORAL_TABLET | Freq: Once | ORAL | Status: AC | PRN
  Administered 2021-07-01: 5 mg via ORAL

## 2021-07-01 MED ORDER — SUGAMMADEX SODIUM 200 MG/2ML IV SOLN
INTRAVENOUS | Status: DC | PRN
  Administered 2021-07-01: 200 mg via INTRAVENOUS

## 2021-07-01 MED ORDER — FENTANYL CITRATE (PF) 100 MCG/2ML IJ SOLN
INTRAMUSCULAR | Status: AC
  Filled 2021-07-01: qty 2

## 2021-07-01 MED ORDER — ACETAMINOPHEN 10 MG/ML IV SOLN: 1000.0000 mg | Freq: Once | INTRAVENOUS | Status: DC | PRN

## 2021-07-01 MED ORDER — ACETAMINOPHEN 500 MG PO TABS
ORAL_TABLET | ORAL | Status: AC
  Administered 2021-07-01: 1000 mg via ORAL
  Filled 2021-07-01: qty 2

## 2021-07-01 MED ORDER — PHENYLEPHRINE HCL (PRESSORS) 10 MG/ML IV SOLN
INTRAVENOUS | Status: DC | PRN
  Administered 2021-07-01: 80 ug via INTRAVENOUS

## 2021-07-01 MED ORDER — BUPIVACAINE-EPINEPHRINE (PF) 0.25% -1:200000 IJ SOLN
INTRAMUSCULAR | Status: AC
  Filled 2021-07-01: qty 30

## 2021-07-01 MED ORDER — OXYCODONE HCL 5 MG PO TABS
ORAL_TABLET | ORAL | Status: AC
  Filled 2021-07-01: qty 1

## 2021-07-01 MED ORDER — 0.9 % SODIUM CHLORIDE (POUR BTL) OPTIME
TOPICAL | Status: DC | PRN
  Administered 2021-07-01: 900 mL

## 2021-07-01 MED ORDER — CHLORHEXIDINE GLUCONATE 0.12 % MT SOLN
OROMUCOSAL | Status: AC
  Administered 2021-07-01: 15 mL via OROMUCOSAL
  Filled 2021-07-01: qty 15

## 2021-07-01 MED ORDER — ROCURONIUM BROMIDE 100 MG/10ML IV SOLN
INTRAVENOUS | Status: DC | PRN
  Administered 2021-07-01: 70 mg via INTRAVENOUS

## 2021-07-01 MED ORDER — INSULIN ASPART 100 UNIT/ML IJ SOLN
5.0000 [IU] | Freq: Once | INTRAMUSCULAR | Status: AC
  Administered 2021-07-01: 5 [IU] via SUBCUTANEOUS

## 2021-07-01 MED ORDER — PROMETHAZINE HCL 25 MG/ML IJ SOLN: 6.2500 mg | INTRAMUSCULAR | Status: DC | PRN

## 2021-07-01 MED ORDER — OXYCODONE HCL 5 MG PO TABS
ORAL_TABLET | ORAL | Status: AC
  Administered 2021-07-01: 5 mg via ORAL
  Filled 2021-07-01: qty 1

## 2021-07-01 MED ORDER — ONDANSETRON HCL 4 MG/2ML IJ SOLN
INTRAMUSCULAR | Status: DC | PRN
  Administered 2021-07-01: 4 mg via INTRAVENOUS

## 2021-07-01 MED ORDER — DROPERIDOL 2.5 MG/ML IJ SOLN
0.6250 mg | Freq: Once | INTRAMUSCULAR | Status: AC | PRN
  Administered 2021-07-01: 0.625 mg via INTRAVENOUS

## 2021-07-01 MED ORDER — GLYCOPYRROLATE 0.2 MG/ML IJ SOLN
INTRAMUSCULAR | Status: AC
  Filled 2021-07-01: qty 1

## 2021-07-01 MED ORDER — FENTANYL CITRATE (PF) 100 MCG/2ML IJ SOLN
25.0000 ug | INTRAMUSCULAR | Status: DC | PRN
  Administered 2021-07-01 (×3): 50 ug via INTRAVENOUS

## 2021-07-01 MED ORDER — BUPIVACAINE LIPOSOME 1.3 % IJ SUSP
INTRAMUSCULAR | Status: AC
  Filled 2021-07-01: qty 20

## 2021-07-01 MED ORDER — FENTANYL CITRATE (PF) 100 MCG/2ML IJ SOLN
INTRAMUSCULAR | Status: DC | PRN
  Administered 2021-07-01: 100 ug via INTRAVENOUS

## 2021-07-01 MED ORDER — CEFAZOLIN SODIUM-DEXTROSE 2-4 GM/100ML-% IV SOLN
INTRAVENOUS | Status: AC
  Filled 2021-07-01: qty 100

## 2021-07-01 MED ORDER — ROCURONIUM BROMIDE 10 MG/ML (PF) SYRINGE
PREFILLED_SYRINGE | INTRAVENOUS | Status: AC
  Filled 2021-07-01: qty 10

## 2021-07-01 MED ORDER — DROPERIDOL 2.5 MG/ML IJ SOLN
INTRAMUSCULAR | Status: AC
  Filled 2021-07-01: qty 2

## 2021-07-01 MED ORDER — INSULIN ASPART 100 UNIT/ML IJ SOLN
INTRAMUSCULAR | Status: AC
  Filled 2021-07-01: qty 1

## 2021-07-01 MED ORDER — LIDOCAINE HCL (CARDIAC) PF 100 MG/5ML IV SOSY
PREFILLED_SYRINGE | INTRAVENOUS | Status: DC | PRN
  Administered 2021-07-01: 100 mg via INTRAVENOUS

## 2021-07-01 MED ORDER — LIDOCAINE HCL (PF) 2 % IJ SOLN
INTRAMUSCULAR | Status: AC
  Filled 2021-07-01: qty 5

## 2021-07-01 MED ORDER — MIDAZOLAM HCL 2 MG/2ML IJ SOLN
INTRAMUSCULAR | Status: AC
  Filled 2021-07-01: qty 2

## 2021-07-01 MED ORDER — OXYCODONE HCL 5 MG/5ML PO SOLN: 5.0000 mg | Freq: Once | ORAL | Status: AC | PRN

## 2021-07-01 MED ORDER — PROPOFOL 10 MG/ML IV BOLUS
INTRAVENOUS | Status: AC
  Filled 2021-07-01: qty 20

## 2021-07-01 MED ORDER — CELECOXIB 200 MG PO CAPS
ORAL_CAPSULE | ORAL | Status: AC
  Administered 2021-07-01: 200 mg via ORAL
  Filled 2021-07-01: qty 1

## 2021-07-01 MED ORDER — PROPOFOL 10 MG/ML IV BOLUS
INTRAVENOUS | Status: DC | PRN
  Administered 2021-07-01: 180 mg via INTRAVENOUS

## 2021-07-01 MED ORDER — PHENYLEPHRINE 80 MCG/ML (10ML) SYRINGE FOR IV PUSH (FOR BLOOD PRESSURE SUPPORT)
PREFILLED_SYRINGE | INTRAVENOUS | Status: AC
  Filled 2021-07-01: qty 10

## 2021-07-01 MED ORDER — GLYCOPYRROLATE 0.2 MG/ML IJ SOLN
INTRAMUSCULAR | Status: DC | PRN
  Administered 2021-07-01: .1 mg via INTRAVENOUS

## 2021-07-01 MED ORDER — ONDANSETRON HCL 4 MG/2ML IJ SOLN
INTRAMUSCULAR | Status: AC
  Filled 2021-07-01: qty 2

## 2021-07-01 MED ORDER — BUPIVACAINE-EPINEPHRINE (PF) 0.25% -1:200000 IJ SOLN
INTRAMUSCULAR | Status: DC | PRN
  Administered 2021-07-01: 50 mL

## 2021-07-01 MED ORDER — EPHEDRINE SULFATE (PRESSORS) 50 MG/ML IJ SOLN
INTRAMUSCULAR | Status: DC | PRN
  Administered 2021-07-01: 10 mg via INTRAVENOUS

## 2021-07-01 MED ORDER — EPHEDRINE 5 MG/ML INJ
INTRAVENOUS | Status: AC
  Filled 2021-07-01: qty 5

## 2021-07-01 MED ORDER — MIDAZOLAM HCL 2 MG/2ML IJ SOLN
INTRAMUSCULAR | Status: DC | PRN
  Administered 2021-07-01: 2 mg via INTRAVENOUS

## 2021-07-01 MED ORDER — HYDROCODONE-ACETAMINOPHEN 5-325 MG PO TABS: 1.0000 | ORAL_TABLET | ORAL | 0 refills | Status: DC | PRN

## 2021-07-01 MED ORDER — OXYCODONE HCL 5 MG PO TABS: 5.0000 mg | ORAL_TABLET | Freq: Once | ORAL | Status: AC

## 2021-07-01 MED ORDER — GABAPENTIN 300 MG PO CAPS
ORAL_CAPSULE | ORAL | Status: AC
  Administered 2021-07-01: 300 mg via ORAL
  Filled 2021-07-01: qty 1

## 2021-07-01 SURGICAL SUPPLY — 47 items
ADH SKN CLS APL DERMABOND .7 (GAUZE/BANDAGES/DRESSINGS) ×1
BAG RETRIEVAL 10 (BASKET) ×1
CANNULA REDUC XI 12-8 STAPL (CANNULA) ×1
CANNULA REDUCER 12-8 DVNC XI (CANNULA) ×1 IMPLANT
CLIP LIGATING HEM O LOK PURPLE (MISCELLANEOUS) ×1 IMPLANT
CLIP LIGATING HEMO O LOK GREEN (MISCELLANEOUS) ×2 IMPLANT
DERMABOND ADVANCED (GAUZE/BANDAGES/DRESSINGS) ×1
DERMABOND ADVANCED .7 DNX12 (GAUZE/BANDAGES/DRESSINGS) ×1 IMPLANT
DRAPE ARM DVNC X/XI (DISPOSABLE) ×4 IMPLANT
DRAPE COLUMN DVNC XI (DISPOSABLE) ×1 IMPLANT
DRAPE DA VINCI XI ARM (DISPOSABLE) ×4
DRAPE DA VINCI XI COLUMN (DISPOSABLE) ×1
ELECT CAUTERY BLADE 6.4 (BLADE) ×2 IMPLANT
ELECT REM PT RETURN 9FT ADLT (ELECTROSURGICAL) ×2
ELECTRODE REM PT RTRN 9FT ADLT (ELECTROSURGICAL) ×1 IMPLANT
GLOVE BIO SURGEON STRL SZ7 (GLOVE) ×4 IMPLANT
GOWN STRL REUS W/ TWL LRG LVL3 (GOWN DISPOSABLE) ×4 IMPLANT
GOWN STRL REUS W/TWL LRG LVL3 (GOWN DISPOSABLE) ×12
IRRIGATION STRYKERFLOW (MISCELLANEOUS) IMPLANT
IRRIGATOR STRYKERFLOW (MISCELLANEOUS) ×2
KIT PINK PAD W/HEAD ARE REST (MISCELLANEOUS) ×2
KIT PINK PAD W/HEAD ARM REST (MISCELLANEOUS) ×1 IMPLANT
LABEL OR SOLS (LABEL) ×2 IMPLANT
MANIFOLD NEPTUNE II (INSTRUMENTS) ×2 IMPLANT
NEEDLE HYPO 22GX1.5 SAFETY (NEEDLE) ×2 IMPLANT
NS IRRIG 500ML POUR BTL (IV SOLUTION) ×2 IMPLANT
OBTURATOR OPTICAL STANDARD 8MM (TROCAR) ×1
OBTURATOR OPTICAL STND 8 DVNC (TROCAR) ×1
OBTURATOR OPTICALSTD 8 DVNC (TROCAR) ×1 IMPLANT
PACK LAP CHOLECYSTECTOMY (MISCELLANEOUS) ×2 IMPLANT
PENCIL ELECTRO HAND CTR (MISCELLANEOUS) ×2 IMPLANT
SEAL CANN UNIV 5-8 DVNC XI (MISCELLANEOUS) ×3 IMPLANT
SEAL XI 5MM-8MM UNIVERSAL (MISCELLANEOUS) ×4
SET TUBE SMOKE EVAC HIGH FLOW (TUBING) ×2 IMPLANT
SOLUTION ELECTROLUBE (MISCELLANEOUS) ×2 IMPLANT
SPIKE FLUID TRANSFER (MISCELLANEOUS) ×2 IMPLANT
SPONGE T-LAP 18X18 ~~LOC~~+RFID (SPONGE) ×2 IMPLANT
SPONGE T-LAP 4X18 ~~LOC~~+RFID (SPONGE) ×1 IMPLANT
STAPLER CANNULA SEAL DVNC XI (STAPLE) ×1 IMPLANT
STAPLER CANNULA SEAL XI (STAPLE) ×1
SUT MNCRL AB 4-0 PS2 18 (SUTURE) ×3 IMPLANT
SUT VICRYL 0 AB UR-6 (SUTURE) ×4 IMPLANT
SYR 20ML LL LF (SYRINGE) ×2 IMPLANT
SYS BAG RETRIEVAL 10MM (BASKET) ×1
SYSTEM BAG RETRIEVAL 10MM (BASKET) ×1 IMPLANT
WATER STERILE IRR 3000ML UROMA (IV SOLUTION) ×1 IMPLANT
WATER STERILE IRR 500ML POUR (IV SOLUTION) ×2 IMPLANT

## 2021-07-01 NOTE — H&P (Signed)
Jacob Baker is an 51 y.o. male.        Chief Complaint  Patient presents with   Follow-up      Gallbladder      HPI: Jacob Baker has a history of cholecystitis and also non-STEMI back in December.  He does have a drug-eluting stents currently on aspirin and Brilinta.  He has completed his cardiology evaluation and they agree that safest thing for him is to wait 6 months the injury.  We can hold Brilinta for 5 days.   He is here for cholecystectomy      Past Medical History:  Diagnosis Date   (HFpEF) heart failure with preserved ejection fraction ([place])      a. 12/2020 Echo: EF 65-70%, no rwma, mild LVH, nl RV fxn, triv MR.   CAD (coronary artery disease)      a. 12/2020 NSTEMI/PCI: LM nl, LAD min irregs, D1 99 (2.0x12 Onyx Frontier DES), LCX 85p (small), RCA 80p/m (4.0x18 Onyx Frontier DES - staged following day), 60d, RPDA mod dzs. EF 50-55%.   Diabetes mellitus without complication ([place])     DVT (deep venous thrombosis) ([place]) 04/2017   GERD (gastroesophageal reflux disease)     History of migraine      none in over 2 yrs   Hyperlipidemia     Hypertension     Syncope and collapse      x1 - approx 2-3 yrs ago - dehydration           Past Surgical History:  Procedure Laterality Date   COLONOSCOPY       COLONOSCOPY WITH PROPOFOL N/A 08/06/2017    Procedure: COLONOSCOPY WITH PROPOFOL;  Surgeon: Lucilla Lame, MD;  Location: [place];  Service: Endoscopy;  Laterality: N/A;  Diabetic - oral meds   CORONARY STENT INTERVENTION N/A 12/31/2020    Procedure: CORONARY STENT INTERVENTION;  Surgeon: Belva Crome, MD;  Location: [place] [facility];  Service: Cardiovascular;  Laterality: N/A;   CORONARY STENT INTERVENTION N/A 01/01/2021    Procedure: CORONARY STENT INTERVENTION;  Surgeon: [name], MD;  Location: [place] [facility];  Service: Cardiovascular;  Laterality: N/A;   ESOPHAGEAL DILATION   09/24/2016    Procedure: ESOPHAGEAL DILATION;  Surgeon: Lucilla Lame,  MD;  Location: [place];  Service: Gastroenterology;;   ESOPHAGOGASTRODUODENOSCOPY (EGD) WITH PROPOFOL N/A 04/08/2015    Procedure: ESOPHAGOGASTRODUODENOSCOPY (EGD) WITH PROPOFOL with dialation;  Surgeon: Lucilla Lame, MD;  Location: [place];  Service: Endoscopy;  Laterality: N/A;  diabetic - oral meds   ESOPHAGOGASTRODUODENOSCOPY (EGD) WITH PROPOFOL N/A 09/24/2016    Procedure: ESOPHAGOGASTRODUODENOSCOPY (EGD) WITH PROPOFOL WITH DILATION;  Surgeon: Lucilla Lame, MD;  Location: [place];  Service: Gastroenterology;  Laterality: N/A;  Diabetic - oral meds   FINGER AMPUTATION   2009    partial removal left index finger    LEFT HEART CATH AND CORONARY ANGIOGRAPHY N/A 12/31/2020    Procedure: LEFT HEART CATH AND CORONARY ANGIOGRAPHY;  Surgeon: Belva Crome, MD;  Location: [place] [facility];  Service: Cardiovascular;  Laterality: N/A;   LEFT HEART CATH AND CORONARY ANGIOGRAPHY N/A 01/01/2021    Procedure: LEFT HEART CATH AND CORONARY ANGIOGRAPHY;  Surgeon: [name], MD;  Location: [place] [facility];  Service: Cardiovascular;  Laterality: N/A;   POLYPECTOMY   08/06/2017    Procedure: POLYPECTOMY INTESTINAL;  Surgeon: Lucilla Lame, MD;  Location: [place];  Service: Endoscopy;;  Family History  Problem Relation Age of Onset   Hypertension Mother     Heart disease Father          CAD   Hypertension Father     Diabetes Father     Hypertension Sister     Hypertension Brother     Hypertension Brother        Social History:  reports that he quit smoking about 4 months ago. His smoking use included cigarettes. He has a 54.00 pack-year smoking history. He has never been exposed to tobacco smoke. He has never used smokeless tobacco. He reports that he does not drink alcohol and does not use drugs.   Allergies: No Known Allergies   Medications reviewed.       ROS Full ROS performed and is otherwise negative other than what is stated  in HPI  Physical Exam   CONSTITUTIONAL: NAD. EYES: Pupils are equal, round,  Sclera are non-icteric. EARS, NOSE, MOUTH AND THROAT:  The oral mucosa is pink and moist. Hearing is intact to voice. LYMPH NODES:  Lymph nodes in the neck are normal. RESPIRATORY:  Lungs are clear. There is normal respiratory effort, with equal breath sounds bilaterally, and without pathologic use of accessory muscles. CARDIOVASCULAR: Heart is regular without murmurs, gallops, or rubs. GI: The abdomen is  soft, nontender, and nondistended. There are no palpable masses. There is no hepatosplenomegaly. There are normal bowel sounds in all quadrants. GU: Rectal deferred.   MUSCULOSKELETAL: Normal muscle strength and tone. No cyanosis or edema.   SKIN: Turgor is good and there are no pathologic skin lesions or ulcers. NEUROLOGIC: Motor and sensation is grossly normal. Cranial nerves are grossly intact. PSYCH:  Oriented to person, place and time. Affect is normal.     Assessment/Plan: 51 year old male with history of cholecystitis and current cholelithiasis with intermittent biliary colic.  In addition to this he does have significant history of non-STEMI with drug-eluting stent.  He is currently on Brilinta and aspirin.  Discussed with the patient and cardiology in detail.  We will hold off for 6 months after the stent was placed he may hold Brilinta for 5 days and continue aspirin.  I discussed the procedure in detail.  The patient was given Neurosurgeon.  We discussed the risks and benefits of a laparoscopic cholecystectomy and possible cholangiogram including, but not limited to bleeding, infection, injury to surrounding structures such as the intestine or liver, bile leak, retained gallstones, need to convert to an open procedure, prolonged diarrhea, blood clots such as  DVT, common bile duct injury, anesthesia risks, and possible need for additional procedures.  The likelihood of improvement in symptoms and  return to the patient's normal status is good. We discussed the typical post-operative recovery course.

## 2021-07-01 NOTE — Op Note (Signed)
Robotic assisted laparoscopic Cholecystectomy  Pre-operative Diagnosis: cholecystitis  Post-operative Diagnosis: same  Procedure:  Robotic assisted laparoscopic Cholecystectomy  Surgeon: Caroleen Hamman, MD FACS  Anesthesia: Gen. with endotracheal tube  Findings: Chronic severe Cholecystitis with gangrenous wall   Estimated Blood Loss: 10cc       Specimens: Gallbladder           Complications: none   Procedure Details  The patient was seen again in the Holding Room. The benefits, complications, treatment options, and expected outcomes were discussed with the patient. The risks of bleeding, infection, recurrence of symptoms, failure to resolve symptoms, bile duct damage, bile duct leak, retained common bile duct stone, bowel injury, any of which could require further surgery and/or ERCP, stent, or papillotomy were reviewed with the patient. The likelihood of improving the patient's symptoms with return to their baseline status is good.  The patient and/or family concurred with the proposed plan, giving informed consent.  The patient was taken to Operating Room, identified  and the procedure verified as Laparoscopic Cholecystectomy.  A Time Out was held and the above information confirmed.  Prior to the induction of general anesthesia, antibiotic prophylaxis was administered. VTE prophylaxis was in place. General endotracheal anesthesia was then administered and tolerated well. After the induction, the abdomen was prepped with Chloraprep and draped in the sterile fashion. The patient was positioned in the supine position.  Cut down technique was used to enter the abdominal cavity and a Hasson trochar was placed after two vicryl stitches were anchored to the fascia. Pneumoperitoneum was then created with CO2 and tolerated well without any adverse changes in the patient's vital signs.  Three 8-mm ports were placed under direct vision. All skin incisions  were infiltrated with a local anesthetic  agent before making the incision and placing the trocars.   The patient was positioned  in reverse Trendelenburg, robot was brought to the surgical field and docked in the standard fashion.  We made sure all the instrumentation was kept indirect view at all times and that there were no collision between the arms. I scrubbed out and went to the console.  The gallbladder was identified, the fundus grasped and retracted cephalad. Adhesions were lysed bluntly. The infundibulum was grasped and retracted laterally, exposing the peritoneum overlying the triangle of Calot. This was then divided and exposed in a blunt fashion. An extended critical view of the cystic duct and cystic artery was obtained.  The cystic duct was clearly identified and bluntly dissected.   Artery and duct were double clipped and divided. Using ICG cholangiography we visualize the cystic duct and CBD, no evidence of bile injuries. The gallbladder was taken from the gallbladder fossa in a retrograde fashion with the electrocautery. There was severe inflammatory component with a gangrenous wall fused to the liver parenchyma. THe wall of the GB rupture due to its fragility and chronic inflammation as well as to being fused to the liver. Hemostasis was achieved with the electrocautery. nspection of the right upper quadrant was performed. No bleeding, bile duct injury or leak, or bowel injury was noted. Robotic instruments and robotic arms were undocked in the standard fashion.  I scrubbed back in. Irrigation of the RUQ performed with normal saline until all debris and bile was aspirated. The gallbladder was removed and placed in an Endocatch bag.   Pneumoperitoneum was released.  The periumbilical port site was closed with interrumpted 0 Vicryl sutures. 4-0 subcuticular Monocryl was used to close the skin. Dermabond was  applied.  The patient was then extubated and brought to the recovery room in stable condition. Sponge, lap, and needle  counts were correct at closure and at the conclusion of the case.               Caroleen Hamman, MD, FACS

## 2021-07-01 NOTE — Anesthesia Preprocedure Evaluation (Addendum)
Anesthesia Evaluation  Patient identified by MRN, date of birth, ID band Patient awake    Reviewed: Allergy & Precautions, NPO status , Patient's Chart, lab work & pertinent test results, reviewed documented beta blocker date and time   Airway Mallampati: II  TM Distance: >3 FB Neck ROM: full    Dental no notable dental hx.    Pulmonary former smoker,    Pulmonary exam normal        Cardiovascular Exercise Tolerance: Good hypertension, Pt. on home beta blockers (-) angina+ CAD, + Past MI, + Cardiac Stents and +CHF (HFpEF)  Normal cardiovascular exam     Neuro/Psych PSYCHIATRIC DISORDERS Anxiety negative neurological ROS     GI/Hepatic Neg liver ROS, hiatal hernia, GERD  Controlled,  Endo/Other  diabetes, Well Controlled, Type 2  Renal/GU      Musculoskeletal   Abdominal Normal abdominal exam  (+)   Peds  Hematology negative hematology ROS (+)   Anesthesia Other Findings Past Medical History: No date: (HFpEF) heart failure with preserved ejection fraction ([place])     Comment:  a. 12/2020 Echo: EF 65-70%, no rwma, mild LVH, nl RV               fxn, triv MR. No date: CAD (coronary artery disease)     Comment:  a. 12/2020 NSTEMI/PCI: LM nl, LAD min irregs, D1 99               (2.0x12 Onyx Frontier DES), LCX 85p (small), RCA 80p/m               (4.0x18 Onyx Frontier DES - staged following day), 60d,               RPDA mod dzs. EF 50-55%. 04/2017: DVT (deep venous thrombosis) ([facility]) No date: GERD (gastroesophageal reflux disease) No date: History of migraine No date: Hyperlipidemia No date: Hypertension No date: Long term current use of antithrombotics/antiplatelets     Comment:  a.) on daily DAPT therapy (ticagrelor + ASA) 12/30/2020: Non-STEMI (non-ST elevated myocardial infarction) ([place]) No date: Syncope and collapse     Comment:  a.) single episode; related to dehydration No date: T2DM (type 2 diabetes mellitus)  ([place])  Past Surgical History: No date: COLONOSCOPY 08/06/2017: COLONOSCOPY WITH PROPOFOL; N/A     Comment:  Procedure: COLONOSCOPY WITH PROPOFOL;  Surgeon: Lucilla Lame, MD;  Location: [facility];  Service:               Endoscopy;  Laterality: N/A;  Diabetic - oral meds 12/31/2020: CORONARY STENT INTERVENTION; N/A     Comment:  Procedure: CORONARY STENT INTERVENTION;  Surgeon: Belva Crome, MD;  Location: [facility];  Service:               Cardiovascular;  Laterality: N/A; 01/01/2021: CORONARY STENT INTERVENTION; N/A     Comment:  Procedure: CORONARY STENT INTERVENTION;  Surgeon:               [name], MD;  Location: [facility];               Service: Cardiovascular;  Laterality: N/A; 09/24/2016: ESOPHAGEAL DILATION     Comment:  Procedure: ESOPHAGEAL DILATION;  Surgeon: Lucilla Lame,  MD;  Location: [facility];  Service:               Gastroenterology;; 04/08/2015: ESOPHAGOGASTRODUODENOSCOPY (EGD) WITH PROPOFOL; N/A     Comment:  Procedure: ESOPHAGOGASTRODUODENOSCOPY (EGD) WITH               PROPOFOL with dialation;  Surgeon: Lucilla Lame, MD;                Location: [facility];  Service: Endoscopy;                Laterality: N/A;  diabetic - oral meds 09/24/2016: ESOPHAGOGASTRODUODENOSCOPY (EGD) WITH PROPOFOL; N/A     Comment:  Procedure: ESOPHAGOGASTRODUODENOSCOPY (EGD) WITH               PROPOFOL WITH DILATION;  Surgeon: Lucilla Lame, MD;                Location: [facility];  Service:               Gastroenterology;  Laterality: N/A;  Diabetic - oral meds 2009: FINGER AMPUTATION     Comment:  partial removal left index finger  12/31/2020: LEFT HEART CATH AND CORONARY ANGIOGRAPHY; N/A     Comment:  Procedure: LEFT HEART CATH AND CORONARY ANGIOGRAPHY;                Surgeon: Belva Crome, MD;  Location: [facility];  Service: Cardiovascular;  Laterality:  N/A; 01/01/2021: LEFT HEART CATH AND CORONARY ANGIOGRAPHY; N/A     Comment:  Procedure: LEFT HEART CATH AND CORONARY ANGIOGRAPHY;                Surgeon: [name], MD;  Location: [facility];  Service: Cardiovascular;  Laterality: N/A; 08/06/2017: POLYPECTOMY     Comment:  Procedure: POLYPECTOMY INTESTINAL;  Surgeon: Lucilla Lame, MD;  Location: [facility];  Service:               Endoscopy;;  BMI    Body Mass Index: 25.73 kg/m      Reproductive/Obstetrics negative OB ROS                            Anesthesia Physical Anesthesia Plan  ASA: 3  Anesthesia Plan: General ETT   Post-op Pain Management: Gabapentin PO (pre-op)*, Celebrex PO (pre-op)* and Tylenol PO (pre-op)*   Induction: Intravenous  PONV Risk Score and Plan: Ondansetron, Dexamethasone, Midazolam and Treatment may vary due to age or medical condition  Airway Management Planned: Oral ETT  Additional Equipment:   Intra-op Plan:   Post-operative Plan: Extubation in OR  Informed Consent: I have reviewed the patients History and Physical, chart, labs and discussed the procedure including the risks, benefits and alternatives for the proposed anesthesia with the patient or authorized representative who has indicated his/her understanding and acceptance.     Dental Advisory Given  Plan Discussed with: Anesthesiologist, CRNA and Surgeon  Anesthesia Plan Comments:         Anesthesia Quick Evaluation

## 2021-07-01 NOTE — Anesthesia Procedure Notes (Signed)
Procedure Name: Intubation Date/Time: 07/01/2021 7:48 AM Performed by: Cammie Sickle, CRNA Pre-anesthesia Checklist: Patient identified, Patient being monitored, Timeout performed, Emergency Drugs available and Suction available Patient Re-evaluated:Patient Re-evaluated prior to induction Oxygen Delivery Method: Circle system utilized Preoxygenation: Pre-oxygenation with 100% oxygen Induction Type: IV induction Ventilation: Mask ventilation without difficulty Laryngoscope Size: 3 and McGraph Grade View: Grade I Tube type: Oral Tube size: 7.5 mm Number of attempts: 1 Airway Equipment and Method: Stylet Placement Confirmation: ETT inserted through vocal cords under direct vision, positive ETCO2 and breath sounds checked- equal and bilateral Secured at: 22 cm Tube secured with: Tape Dental Injury: Teeth and Oropharynx as per pre-operative assessment

## 2021-07-01 NOTE — Anesthesia Postprocedure Evaluation (Signed)
Anesthesia Post Note  Patient: Jacob Baker  Procedure(s) Performed: XI ROBOTIC ASSISTED LAPAROSCOPIC CHOLECYSTECTOMY (Abdomen) INDOCYANINE GREEN FLUORESCENCE IMAGING (ICG) (Abdomen)  Patient location during evaluation: PACU Anesthesia Type: General Level of consciousness: awake and alert Pain management: pain level controlled Vital Signs Assessment: post-procedure vital signs reviewed and stable Respiratory status: spontaneous breathing, nonlabored ventilation and respiratory function stable Cardiovascular status: blood pressure returned to baseline and stable Postop Assessment: no apparent nausea or vomiting Anesthetic complications: no   No notable events documented.   Last Vitals:  Vitals:   07/01/21 1028 07/01/21 1124  BP: (!) 140/91 120/82  Pulse: 62 68  Resp: 16 16  Temp: (!) 36.2 C   SpO2: 95% 98%    Last Pain:  Vitals:   07/01/21 1144  TempSrc:   PainSc: [place]

## 2021-07-01 NOTE — Transfer of Care (Signed)
Immediate Anesthesia Transfer of Care Note  Patient: Jacob Baker  Procedure(s) Performed: XI ROBOTIC ASSISTED LAPAROSCOPIC CHOLECYSTECTOMY (Abdomen) INDOCYANINE GREEN FLUORESCENCE IMAGING (ICG) (Abdomen)  Patient Location: PACU  Anesthesia Type:General  Level of Consciousness: drowsy  Airway & Oxygen Therapy: Patient Spontanous Breathing and Patient connected to face mask oxygen  Post-op Assessment: Report given to RN and Post -op Vital signs reviewed and stable  Post vital signs: Reviewed and stable  Last Vitals:  Vitals Value Taken Time  BP 138/85 07/01/21 0919  Temp 36.2 C 07/01/21 0919  Pulse 63 07/01/21 0923  Resp 13 07/01/21 0923  SpO2 100 % 07/01/21 0923  Vitals shown include unvalidated device data.  Last Pain:  Vitals:   07/01/21 0600  TempSrc: Oral         Complications: No notable events documented.

## 2021-07-01 NOTE — Discharge Instructions (Addendum)
Laparoscopic Cholecystectomy, Care After  ° °These instructions give you information on caring for yourself after your procedure. Your doctor may also give you more specific instructions. Call your doctor if you have any problems or questions after your procedure.  °HOME CARE  °Change your bandages (dressings) as told by your doctor.  °Keep the wound dry and clean. Wash the wound gently with soap and water. Pat the wound dry with a clean towel.  °Do not take baths, swim, or use hot tubs for 2 weeks, or as told by your doctor.  °Only take medicine as told by your doctor.  °Eat a normal diet as told by your doctor.  °Do not lift anything heavier than 10 pounds (4.5 kg) until your doctor says it is okay.  °Do not play contact sports for 1 week, or as told by your doctor. °GET HELP IF:  °Your wound is red, puffy (swollen), or painful.  °You have yellowish-white fluid (pus) coming from the wound.  °You have fluid draining from the wound for more than 1 day.  °You have a bad smell coming from the wound.  °Your wound breaks open. °GET HELP RIGHT AWAY IF:  °You have trouble breathing.  °You have chest pain.  °You have a fever >101  °You have pain in the shoulders (shoulder strap areas) that is getting worse.  °You feel dizzy or pass out (faint).  °You have severe belly (abdominal) pain.  °You feel sick to your stomach (nauseous) or throw up (vomit) for more than 1 day. ° °AMBULATORY SURGERY  °DISCHARGE INSTRUCTIONS ° ° °The drugs that you were given will stay in your system until tomorrow so for the next 24 hours you should not: ° °Drive an automobile °Make any legal decisions °Drink any alcoholic beverage ° ° °You may resume regular meals tomorrow.  Today it is better to start with liquids and gradually work up to solid foods. ° °You may eat anything you prefer, but it is better to start with liquids, then soup and crackers, and gradually work up to solid foods. ° ° °Please notify your doctor immediately if you have any  unusual bleeding, trouble breathing, redness and pain at the surgery site, drainage, fever, or pain not relieved by medication. ° ° ° °Additional Instructions: °Please contact your physician with any problems or Same Day Surgery at [phone], Monday through Friday 6 am to 4 pm, or [place] [facility] number at [phone].  °  °

## 2021-07-03 LAB — SURGICAL PATHOLOGY

## 2021-07-15 ENCOUNTER — Telehealth: Payer: Self-pay | Admitting: *Deleted

## 2021-07-15 ENCOUNTER — Other Ambulatory Visit: Payer: Self-pay

## 2021-07-15 ENCOUNTER — Ambulatory Visit (INDEPENDENT_AMBULATORY_CARE_PROVIDER_SITE_OTHER): Payer: BC Managed Care – PPO | Admitting: Physician Assistant

## 2021-07-15 ENCOUNTER — Encounter: Payer: Self-pay | Admitting: Physician Assistant

## 2021-07-15 VITALS — BP 115/79 | HR 71 | Temp 98.3°F | Ht 73.0 in | Wt 187.2 lb

## 2021-07-15 DIAGNOSIS — K819 Cholecystitis, unspecified: Secondary | ICD-10-CM

## 2021-07-15 DIAGNOSIS — K811 Chronic cholecystitis: Secondary | ICD-10-CM

## 2021-07-15 DIAGNOSIS — Z09 Encounter for follow-up examination after completed treatment for conditions other than malignant neoplasm: Secondary | ICD-10-CM

## 2021-07-15 DIAGNOSIS — K8020 Calculus of gallbladder without cholecystitis without obstruction: Secondary | ICD-10-CM

## 2021-07-15 NOTE — Patient Instructions (Signed)
Advised to pursue a goal of 25 to 30 g of fiber daily.  Made aware that the majority of this may be through natural sources, but advised to be aware of actual consumption and to ensure minimal consumption by daily supplementation.  Various forms of supplements discussed.  Recommended Psyllium husk, that mixes well with applesauce, or the powder which goes down well shaken with chocolate milk.  Strongly advised to consume more fluids to ensure adequate hydration, instructed to watch color of urine to determine adequacy of hydration.  Clarity is pursued in urine output, and bowel activity that correlates to significant meal intake.   We need to avoid deferring having bowel movements, advised to take the time at the first sign of sensation, typically following meals, and in the morning.   Subsequent utilization of MiraLAX may be needed ensure at least daily movement, ideally twice daily bowel movements.  If multiple doses of MiraLAX are necessary utilize them. Never skip a day...  To be regular, we must do the above EVERY day.      GENERAL POST-OPERATIVE PATIENT INSTRUCTIONS   WOUND CARE INSTRUCTIONS:  Keep a dry clean dressing on the wound if there is drainage. The initial bandage may be removed after 24 hours.  Once the wound has quit draining you may leave it open to air.  If clothing rubs against the wound or causes irritation and the wound is not draining you may cover it with a dry dressing during the daytime.  Try to keep the wound dry and avoid ointments on the wound unless directed to do so.  If the wound becomes bright red and painful or starts to drain infected material that is not clear, please contact your physician immediately.  If the wound is mildly pink and has a thick firm ridge underneath it, this is normal, and is referred to as a healing ridge.  This will resolve over the next 4-6 weeks.  BATHING: You may shower if you have been informed of this by your surgeon. However, Please do  not submerge in a tub, hot tub, or pool until incisions are completely sealed or have been told by your surgeon that you may do so.  DIET:  You may eat any foods that you can tolerate.  It is a good idea to eat a high fiber diet and take in plenty of fluids to prevent constipation.  If you do become constipated you may want to take a mild laxative or take ducolax tablets on a daily basis until your bowel habits are regular.  Constipation can be very uncomfortable, along with straining, after recent surgery.  ACTIVITY:  You are encouraged to cough and deep breath or use your incentive spirometer if you were given one, every 15-30 minutes when awake.  This will help prevent respiratory complications and low grade fevers post-operatively if you had a general anesthetic.  You may want to hug a pillow when coughing and sneezing to add additional support to the surgical area, if you had abdominal or chest surgery, which will decrease pain during these times.  You are encouraged to walk and engage in light activity for the next two weeks.  You should not lift more than 20 pounds for 6 weeks total after surgery as it could put you at increased risk for complications.  Twenty pounds is roughly equivalent to a plastic bag of groceries. At that time- Listen to your body when lifting, if you have pain when lifting, stop and then try   again in a few days. Soreness after doing exercises or activities of daily living is normal as you get back in to your normal routine.  MEDICATIONS:  Try to take narcotic medications and anti-inflammatory medications, such as tylenol, ibuprofen, naprosyn, etc., with food.  This will minimize stomach upset from the medication.  Should you develop nausea and vomiting from the pain medication, or develop a rash, please discontinue the medication and contact your physician.  You should not drive, make important decisions, or operate machinery when taking narcotic pain medication.  SUNBLOCK Use  sun block to incision area over the next year if this area will be exposed to sun. This helps decrease scarring and will allow you avoid a permanent darkened area over your incision.  QUESTIONS:  Please feel free to call our office if you have any questions, and we will be glad to assist you. [phone]   

## 2021-07-15 NOTE — Telephone Encounter (Signed)
Faxed FMLA to [employer] at [phone]

## 2021-07-15 NOTE — Progress Notes (Signed)
[facility] POST-OP OFFICE VISIT  07/15/2021  HPI: Jacob Baker is a 51 y.o. male 14 days s/p robotic assisted laparoscopic cholecystectomy for chronic cholecystitis with Dr Dahlia Byes   He is doing well He had pain for about 1 week but is now pain free; not needing medications No fever, chills, nausea, emesis Some constipation No issues with incisions No other complaints   Vital signs: BP 115/79   Pulse 71   Temp 98.3 F (36.8 C) (Oral)   Ht '6\' 1"'$  (1.854 m)   Wt 187 lb 3.2 oz (84.9 kg)   SpO2 99%   BMI 24.70 kg/m    Physical Exam: Constitutional: Well appearing male, NAD Abdomen: Soft, non-tender, non-distended, no rebound/guarding Skin: Laparoscopic incisions are healing well, no erythema or drainage   Assessment/Plan: This is a 51 y.o. male 14 days s/p robotic assisted laparoscopic cholecystectomy for chronic cholecystitis    - Pain control prn  - Reviewed wound care recommendation  - Reviewed lifting restrictions; 4 weeks total; work note given   - Reviewed surgical pathology; gangrenous cholecystitis  - He can follow up on as needed basis; He understands to call with questions/concerns  -- Edison Simon, PA-C  [facility] 07/15/2021, 2:54 PM M-F: 7am - 4pm

## 2021-08-04 ENCOUNTER — Encounter: Payer: Self-pay | Admitting: Gastroenterology

## 2021-08-05 ENCOUNTER — Telehealth: Payer: Self-pay | Admitting: Gastroenterology

## 2021-08-05 MED ORDER — TRULANCE 3 MG PO TABS: 1.0000 | ORAL_TABLET | Freq: Every day | ORAL | 6 refills | Status: DC

## 2021-08-05 NOTE — Addendum Note (Signed)
Addended by: Lurlean Nanny on: 08/05/2021 09:47 AM   Modules accepted: Orders

## 2021-08-05 NOTE — Telephone Encounter (Signed)
Patient requesting refill on prescription of trulance.

## 2021-08-07 ENCOUNTER — Encounter: Payer: Self-pay | Admitting: Cardiology

## 2021-08-07 DIAGNOSIS — Z0289 Encounter for other administrative examinations: Secondary | ICD-10-CM

## 2021-08-07 DIAGNOSIS — I2510 Atherosclerotic heart disease of native coronary artery without angina pectoris: Secondary | ICD-10-CM

## 2021-08-12 ENCOUNTER — Ambulatory Visit (HOSPITAL_BASED_OUTPATIENT_CLINIC_OR_DEPARTMENT_OTHER)
Admission: RE | Admit: 2021-08-12 | Discharge: 2021-08-12 | Disposition: A | Discharge status: Home / Self Care | Payer: BC Managed Care – PPO | Source: Ambulatory Visit | Attending: Cardiology | Admitting: Cardiology

## 2021-08-12 ENCOUNTER — Ambulatory Visit (HOSPITAL_COMMUNITY)
Admission: RE | Admit: 2021-08-12 | Discharge: 2021-08-12 | Disposition: A | Discharge status: Home / Self Care | Payer: BC Managed Care – PPO | Source: Ambulatory Visit | Attending: Cardiology | Admitting: Cardiology

## 2021-08-12 DIAGNOSIS — Z0289 Encounter for other administrative examinations: Secondary | ICD-10-CM | POA: Diagnosis not present

## 2021-08-12 DIAGNOSIS — I2510 Atherosclerotic heart disease of native coronary artery without angina pectoris: Secondary | ICD-10-CM | POA: Diagnosis not present

## 2021-08-12 LAB — NM MYOCAR MULTI W/SPECT W/WALL MOTION / EF
LV dias vol: 129 mL (ref 62–150)
LV sys vol: 48 mL
Nuc Stress EF: 63 %
Peak HR: 89 {beats}/min
RATE: 0.3
Rest BP: 89 mmHg
Rest HR: 68 {beats}/min
Rest Nuclear Isotope Dose: 10.1 mCi
SDS: 2
SRS: 1
SSS: 3
ST Depression (mm): 0 mm
Stress Nuclear Isotope Dose: 30 mCi
TID: 1.36

## 2021-08-12 MED ORDER — SODIUM CHLORIDE FLUSH 0.9 % IV SOLN
INTRAVENOUS | Status: AC
  Filled 2021-08-12: qty 10

## 2021-08-12 MED ORDER — TECHNETIUM TC 99M TETROFOSMIN IV KIT
30.0000 | PACK | Freq: Once | INTRAVENOUS | Status: AC | PRN
  Administered 2021-08-12: 30 via INTRAVENOUS

## 2021-08-12 MED ORDER — TECHNETIUM TC 99M TETROFOSMIN IV KIT
10.0000 | PACK | Freq: Once | INTRAVENOUS | Status: AC | PRN
  Administered 2021-08-12: 10.1 via INTRAVENOUS

## 2021-08-12 MED ORDER — REGADENOSON 0.4 MG/5ML IV SOLN
INTRAVENOUS | Status: AC
  Filled 2021-08-12: qty 5

## 2021-08-13 ENCOUNTER — Other Ambulatory Visit: Payer: Self-pay

## 2021-08-13 ENCOUNTER — Telehealth: Payer: Self-pay

## 2021-08-13 MED ORDER — PANTOPRAZOLE SODIUM 40 MG PO TBEC: DELAYED_RELEASE_TABLET | ORAL | 3 refills | Status: DC

## 2021-08-13 NOTE — Telephone Encounter (Signed)
Received a refill request for Glipizide. Looks like medication was discontinued by a different provider.

## 2021-08-13 NOTE — Telephone Encounter (Signed)
noted 

## 2021-08-16 ENCOUNTER — Other Ambulatory Visit: Payer: Self-pay | Admitting: "Endocrinology

## 2021-08-16 DIAGNOSIS — E1165 Type 2 diabetes mellitus with hyperglycemia: Secondary | ICD-10-CM

## 2021-08-18 ENCOUNTER — Ambulatory Visit (INDEPENDENT_AMBULATORY_CARE_PROVIDER_SITE_OTHER): Payer: BC Managed Care – PPO | Admitting: Internal Medicine

## 2021-08-18 ENCOUNTER — Encounter: Payer: Self-pay | Admitting: Internal Medicine

## 2021-08-18 VITALS — BP 122/82 | HR 78 | Temp 98.2°F | Ht 73.0 in | Wt 195.4 lb

## 2021-08-18 DIAGNOSIS — E782 Mixed hyperlipidemia: Secondary | ICD-10-CM

## 2021-08-18 DIAGNOSIS — K5904 Chronic idiopathic constipation: Secondary | ICD-10-CM

## 2021-08-18 DIAGNOSIS — R0689 Other abnormalities of breathing: Secondary | ICD-10-CM | POA: Diagnosis not present

## 2021-08-18 DIAGNOSIS — I25119 Atherosclerotic heart disease of native coronary artery with unspecified angina pectoris: Secondary | ICD-10-CM

## 2021-08-18 DIAGNOSIS — R0600 Dyspnea, unspecified: Secondary | ICD-10-CM | POA: Diagnosis not present

## 2021-08-18 DIAGNOSIS — E1169 Type 2 diabetes mellitus with other specified complication: Secondary | ICD-10-CM

## 2021-08-18 DIAGNOSIS — R6881 Early satiety: Secondary | ICD-10-CM

## 2021-08-18 MED ORDER — BREZTRI AEROSPHERE 160-9-4.8 MCG/ACT IN AERO: 2.0000 | INHALATION_SPRAY | Freq: Two times a day (BID) | RESPIRATORY_TRACT | 11 refills | Status: DC

## 2021-08-18 MED ORDER — ALBUTEROL SULFATE HFA 108 (90 BASE) MCG/ACT IN AERS: 2.0000 | INHALATION_SPRAY | Freq: Four times a day (QID) | RESPIRATORY_TRACT | 1 refills | Status: AC | PRN

## 2021-08-18 NOTE — Progress Notes (Unsigned)
Subjective:  Patient ID: Jacob Baker, male    DOB: [birth year]  Age: 51 y.o. MRN: [id number]  CC: There were no encounter diagnoses.   HPI Jacob Baker presents for follow up on multiple issues and for new onset dyspnea   Jacob Baker is a 51 yr old male with a history of tobacco abuse,  CAD s/p NSTEMI Dec 2022.  Type 2 DM who recently underwent cholecystectomy in June which was postponed for 6 months due to recent NSTEMI with DES placement x  2 in Dec requiring uninterrupted antiplatelet therapy.  Marland Kitchen  Dyspnea began in April , he quit smoking December after his NSTEMI,  dyspnea  occurs without activity,  wakes him up from sleep.  No panic , no cough.  Feels like he can't catch his breath. Had one episode with cough  self treated with Delsym.     No history of travel or COVID infection.    Myoview was normal July 18,    EF 63%    1) Type 2 DM: managed by endocrinologist at [facility]  Lab Results  Component Value Date   HGBA1C 5.7 (H) 06/30/2021   2) s/p cholecystectomy in June for Chronic severe Cholecystitis with gangrenous wall .  Surgery was delayed by 6 months due to NSTEMI suffered in in Dec with DES x 2 placed.  )First Diagonal,  RCA  during sequential cardiac catheterizations)  3) lack of appetite , started after gallbladder surgery.  Has not lost weight.  Eats a meal , feels full for nearly a day.    4) chronic Constipation:  worse since having gallbladder out.   Now taking Trulance but having watery stools  with very little solid material .  Often  goes not have a BM for up to 3 days without use of Trulance . Last colonoscopy was 3 years ago .  Has tried an OTC "colon cleanser" which cleaned him out for a day or 2 at best.  Has tried bulk forming laxatives for several months,   Outpatient Medications Prior to Visit  Medication Sig Dispense Refill   aspirin 81 MG EC tablet Take 1 tablet (81 mg total) by mouth daily with breakfast. Swallow whole. 90 tablet 3   glipiZIDE  (GLUCOTROL) 10 MG tablet Take 10 mg by mouth daily before breakfast.     glucose blood (ONETOUCH VERIO) test strip Test sugars twice daily 50 each 6   HYDROcodone-acetaminophen (NORCO/VICODIN) 5-325 MG tablet Take 1-2 tablets by mouth every 4 (four) hours as needed for moderate pain. 25 tablet 0   magnesium hydroxide (MILK OF MAGNESIA) 400 MG/5ML suspension Take by mouth daily as needed for mild constipation.     metFORMIN (GLUCOPHAGE) 850 MG tablet TAKE 1 TABLET(850 MG) BY MOUTH TWICE DAILY WITH A MEAL 180 tablet 1   metoprolol tartrate (LOPRESSOR) 25 MG tablet Take 1 tablet (25 mg total) by mouth 2 (two) times daily. 60 tablet 11   nitroGLYCERIN (NITROSTAT) 0.4 MG SL tablet Place 1 tablet (0.4 mg total) under the tongue every 5 (five) minutes as needed for chest pain. 25 tablet 1   ONETOUCH DELICA LANCETS 63S MISC Test sugars twice daily 50 each 3   pantoprazole (PROTONIX) 40 MG tablet TAKE 1 TABLET(40 MG) BY MOUTH TWICE DAILY 180 tablet 3   Plecanatide (TRULANCE) 3 MG TABS Take 1 tablet by mouth daily. 30 tablet 6   ranolazine (RANEXA) 500 MG 12 hr tablet Take 500 mg by mouth 2 (two)  times daily.     rosuvastatin (CRESTOR) 40 MG tablet Take 40 mg by mouth daily.     ticagrelor (BRILINTA) 90 MG TABS tablet Take 1 tablet (90 mg total) by mouth 2 (two) times daily. 60 tablet 11   vitamin B-12 (CYANOCOBALAMIN) 1000 MCG tablet Take 1,000 mcg by mouth daily.     No facility-administered medications prior to visit.    Review of Systems;  Patient denies headache, fevers, malaise, unintentional weight loss, skin rash, eye pain, sinus congestion and sinus pain, sore throat, dysphagia,  hemoptysis , cough, dyspnea, wheezing, chest pain, palpitations, orthopnea, edema, abdominal pain, nausea, melena, diarrhea, constipation, flank pain, dysuria, hematuria, urinary  Frequency, nocturia, numbness, tingling, seizures,  Focal weakness, Loss of consciousness,  Tremor, insomnia, depression, anxiety, and suicidal  ideation.      Objective:  There were no vitals taken for this visit.  BP Readings from Last 3 Encounters:  07/15/21 115/79  07/01/21 121/85  05/28/21 129/84    Wt Readings from Last 3 Encounters:  07/15/21 187 lb 3.2 oz (84.9 kg)  07/01/21 195 lb (88.5 kg)  06/24/21 190 lb (86.2 kg)    General appearance: alert, cooperative and appears stated age Ears: normal TM's and external ear canals both ears Throat: lips, mucosa, and tongue normal; teeth and gums normal Neck: no adenopathy, no carotid bruit, supple, symmetrical, trachea midline and thyroid not enlarged, symmetric, no tenderness/mass/nodules Back: symmetric, no curvature. ROM normal. No CVA tenderness. Lungs: clear to auscultation bilaterally Heart: regular rate and rhythm, S1, S2 normal, no murmur, click, rub or gallop Abdomen: soft, non-tender; bowel sounds normal; no masses,  no organomegaly Pulses: 2+ and symmetric Skin: Skin color, texture, turgor normal. No rashes or lesions Lymph nodes: Cervical, supraclavicular, and axillary nodes normal.  Lab Results  Component Value Date   HGBA1C 5.7 (H) 06/30/2021   HGBA1C 4.9 03/28/2021   HGBA1C 7.4 (H) 12/31/2020    Lab Results  Component Value Date   CREATININE 0.71 06/30/2021   CREATININE 0.95 02/20/2021   CREATININE 0.91 01/13/2021    Lab Results  Component Value Date   WBC 5.4 06/30/2021   HGB 10.5 (L) 06/30/2021   HCT 30.6 (L) 06/30/2021   PLT 207 06/30/2021   GLUCOSE 188 (H) 06/30/2021   CHOL 123 03/28/2021   TRIG 155 (H) 03/28/2021   HDL 29 (L) 03/28/2021   LDLDIRECT 176.0 10/18/2018   LDLCALC 63 03/28/2021   ALT 13 03/28/2021   AST 11 (L) 03/28/2021   NA 140 06/30/2021   K 4.3 06/30/2021   CL 108 06/30/2021   CREATININE 0.71 06/30/2021   BUN 8 06/30/2021   CO2 25 06/30/2021   TSH 1.32 11/16/2019   PSA 0.72 10/21/2020   INR 0.96 10/13/2011   HGBA1C 5.7 (H) 06/30/2021   MICROALBUR 30 05/22/2020    NM Myocar Multi W/Spect W/Wall Motion /  EF  Result Date: 08/12/2021   The study is normal. The study is low risk.   No ST deviation was noted.   LV perfusion is normal.   Left ventricular function is normal. Nuclear stress EF: 63 %. The left ventricular ejection fraction is normal (55-65%). End diastolic cavity size is normal.    Assessment & Plan:   Problem List Items Addressed This Visit   None   I spent a total of   minutes with this patient in a face to face visit on the date of this encounter reviewing the last office visit with me on        ,  most recent with patient's cardiologist in    ,  patient'ss diet and eating habits, home blood pressure readings ,  most recent imaging study ,   and post visit ordering of testing and therapeutics.    Follow-up: No follow-ups on file.   [name], MD

## 2021-08-18 NOTE — Patient Instructions (Addendum)
Referrals for the following tests or evaluations have been made:   Pulmonary function tests  Pulmonology referral  Gastric emptying study   For the next week:  try taking metamucil or benefiber each evening ( 2 hours apart from your other medications,  AND YOUR USUAL DOSE OF TRULANCE   I have refilled the albuterol inhaler for you to use in an emergency and I want you to start using Breztri  inhaler two times daily (4 puffs total) if it is affordable

## 2021-08-19 DIAGNOSIS — R0609 Other forms of dyspnea: Secondary | ICD-10-CM | POA: Insufficient documentation

## 2021-08-19 DIAGNOSIS — R0600 Dyspnea, unspecified: Secondary | ICD-10-CM | POA: Insufficient documentation

## 2021-08-19 LAB — CBC WITH DIFFERENTIAL/PLATELET
Basophils Absolute: 0.1 10*3/uL (ref 0.0–0.1)
Basophils Relative: 1.1 % (ref 0.0–3.0)
Eosinophils Absolute: 0.1 10*3/uL (ref 0.0–0.7)
Eosinophils Relative: 1.7 % (ref 0.0–5.0)
HCT: 30.2 % — ABNORMAL LOW (ref 39.0–52.0)
Hemoglobin: 10.6 g/dL — ABNORMAL LOW (ref 13.0–17.0)
Lymphocytes Relative: 20.3 % (ref 12.0–46.0)
Lymphs Abs: 1.3 10*3/uL (ref 0.7–4.0)
MCHC: 35.2 g/dL (ref 30.0–36.0)
MCV: 81.2 fl (ref 78.0–100.0)
Monocytes Absolute: 0.5 10*3/uL (ref 0.1–1.0)
Monocytes Relative: 7.9 % (ref 3.0–12.0)
Neutro Abs: 4.5 10*3/uL (ref 1.4–7.7)
Neutrophils Relative %: 69 % (ref 43.0–77.0)
Platelets: 250 10*3/uL (ref 150.0–400.0)
RBC: 3.72 Mil/uL — ABNORMAL LOW (ref 4.22–5.81)
RDW: 15.5 % (ref 11.5–15.5)
WBC: 6.5 10*3/uL (ref 4.0–10.5)

## 2021-08-19 LAB — COMPREHENSIVE METABOLIC PANEL
ALT: 15 U/L (ref 0–53)
AST: 10 U/L (ref 0–37)
Albumin: 5 g/dL (ref 3.5–5.2)
Alkaline Phosphatase: 78 U/L (ref 39–117)
BUN: 12 mg/dL (ref 6–23)
CO2: 22 mEq/L (ref 19–32)
Calcium: 9.8 mg/dL (ref 8.4–10.5)
Chloride: 104 mEq/L (ref 96–112)
Creatinine, Ser: 1.17 mg/dL (ref 0.40–1.50)
GFR: 71.27 mL/min (ref >60.00)
Glucose, Bld: 117 mg/dL — ABNORMAL HIGH (ref 70–99)
Potassium: 3.8 mEq/L (ref 3.5–5.1)
Sodium: 140 mEq/L (ref 135–145)
Total Bilirubin: 0.9 mg/dL (ref 0.2–1.2)
Total Protein: 6.9 g/dL (ref 6.0–8.3)

## 2021-08-19 LAB — LIPID PANEL W/REFLEX DIRECT LDL
Cholesterol: 106 mg/dL (ref <200)
HDL: 30 mg/dL — ABNORMAL LOW (ref >=40)
LDL Cholesterol (Calc): 46 mg/dL (calc)
Non-HDL Cholesterol (Calc): 76 mg/dL (calc) (ref <130)
Total CHOL/HDL Ratio: 3.5 (calc) (ref <5.0)
Triglycerides: 254 mg/dL — ABNORMAL HIGH (ref <150)

## 2021-08-19 LAB — TSH: TSH: 1.49 u[IU]/mL (ref 0.35–5.50)

## 2021-08-19 LAB — D-DIMER, QUANTITATIVE: D-Dimer, Quant: 0.29 mcg/mL FEU (ref <0.50)

## 2021-08-19 NOTE — Assessment & Plan Note (Signed)
Gastric emptying study ordered

## 2021-08-19 NOTE — Assessment & Plan Note (Addendum)
Recommend combining BFL with Trulance. As a trial and follow up with gastroenterology

## 2021-08-19 NOTE — Assessment & Plan Note (Signed)
Symptoms began prior to surgery and do not suggest PE.  PFTs and pulmonary referral made.

## 2021-08-19 NOTE — Assessment & Plan Note (Signed)
He is tolerating minimal dose of rosuvastatin, and diabetes is now well controlled.   Lab Results  Component Value Date   HGBA1C 5.7 (H) 06/30/2021   Lab Results  Component Value Date   CHOL 123 03/28/2021   HDL 29 (L) 03/28/2021   LDLCALC 63 03/28/2021   LDLDIRECT 176.0 10/18/2018   TRIG 155 (H) 03/28/2021   CHOLHDL 4.2 03/28/2021

## 2021-08-28 ENCOUNTER — Other Ambulatory Visit: Payer: BC Managed Care – PPO

## 2021-09-04 ENCOUNTER — Ambulatory Visit (INDEPENDENT_AMBULATORY_CARE_PROVIDER_SITE_OTHER): Payer: BC Managed Care – PPO | Admitting: "Endocrinology

## 2021-09-04 ENCOUNTER — Encounter: Payer: Self-pay | Admitting: "Endocrinology

## 2021-09-04 VITALS — BP 100/64 | HR 72 | Ht 73.0 in | Wt 188.6 lb

## 2021-09-04 DIAGNOSIS — E782 Mixed hyperlipidemia: Secondary | ICD-10-CM | POA: Diagnosis not present

## 2021-09-04 DIAGNOSIS — E1159 Type 2 diabetes mellitus with other circulatory complications: Secondary | ICD-10-CM

## 2021-09-04 DIAGNOSIS — I10 Essential (primary) hypertension: Secondary | ICD-10-CM | POA: Diagnosis not present

## 2021-09-04 NOTE — Progress Notes (Signed)
09/04/2021, 6:00 PM   Endocrinology follow-up note  Subjective:    Patient ID: Jacob Baker, male    DOB: 10-May-1970.  Jacob Baker is being seen in follow-up after he was seen in consultation for management of currently uncontrolled symptomatic diabetes requested by  [name], MD.   Past Medical History:  Diagnosis Date   (HFpEF) heart failure with preserved ejection fraction ([place])    a. 12/2020 Echo: EF 65-70%, no rwma, mild LVH, nl RV fxn, triv MR.   CAD (coronary artery disease)    a. 12/2020 NSTEMI/PCI: LM nl, LAD min irregs, D1 99 (2.0x12 Onyx Frontier DES), LCX 85p (small), RCA 80p/m (4.0x18 Onyx Frontier DES - staged following day), 60d, RPDA mod dzs. EF 50-55%.   DVT (deep venous thrombosis) ([place]) 04/2017   GERD (gastroesophageal reflux disease)    History of migraine    Hyperlipidemia    Hypertension    Long term current use of antithrombotics/antiplatelets    a.) on daily DAPT therapy (ticagrelor + ASA)   Non-STEMI (non-ST elevated myocardial infarction) ([place]) 12/30/2020   Syncope and collapse    a.) single episode; related to dehydration   T2DM (type 2 diabetes mellitus) ([place])     Past Surgical History:  Procedure Laterality Date   CHOLECYSTECTOMY     COLONOSCOPY     COLONOSCOPY WITH PROPOFOL N/A 08/06/2017   Procedure: COLONOSCOPY WITH PROPOFOL;  Surgeon: Lucilla Lame, MD;  Location: [facility];  Service: Endoscopy;  Laterality: N/A;  Diabetic - oral meds   CORONARY STENT INTERVENTION N/A 12/31/2020   Procedure: CORONARY STENT INTERVENTION;  Surgeon: Belva Crome, MD;  Location: [facility];  Service: Cardiovascular;  Laterality: N/A;   CORONARY STENT INTERVENTION N/A 01/01/2021   Procedure: CORONARY STENT INTERVENTION;  Surgeon: [name], MD;  Location: [facility];  Service: Cardiovascular;  Laterality: N/A;   ESOPHAGEAL DILATION   09/24/2016   Procedure: ESOPHAGEAL DILATION;  Surgeon: Lucilla Lame, MD;  Location: [facility];  Service: Gastroenterology;;   ESOPHAGOGASTRODUODENOSCOPY (EGD) WITH PROPOFOL N/A 04/08/2015   Procedure: ESOPHAGOGASTRODUODENOSCOPY (EGD) WITH PROPOFOL with dialation;  Surgeon: Lucilla Lame, MD;  Location: [facility];  Service: Endoscopy;  Laterality: N/A;  diabetic - oral meds   ESOPHAGOGASTRODUODENOSCOPY (EGD) WITH PROPOFOL N/A 09/24/2016   Procedure: ESOPHAGOGASTRODUODENOSCOPY (EGD) WITH PROPOFOL WITH DILATION;  Surgeon: Lucilla Lame, MD;  Location: [facility];  Service: Gastroenterology;  Laterality: N/A;  Diabetic - oral meds   FINGER AMPUTATION  01/27/2007   partial removal left index finger    LEFT HEART CATH AND CORONARY ANGIOGRAPHY N/A 12/31/2020   Procedure: LEFT HEART CATH AND CORONARY ANGIOGRAPHY;  Surgeon: Belva Crome, MD;  Location: [facility];  Service: Cardiovascular;  Laterality: N/A;   LEFT HEART CATH AND CORONARY ANGIOGRAPHY N/A 01/01/2021   Procedure: LEFT HEART CATH AND CORONARY ANGIOGRAPHY;  Surgeon: [name], MD;  Location: [facility];  Service: Cardiovascular;  Laterality: N/A;   POLYPECTOMY  08/06/2017   Procedure: POLYPECTOMY INTESTINAL;  Surgeon: Lucilla Lame, MD;  Location: [facility];  Service: Endoscopy;;  Social History   Socioeconomic History   Marital status: Married    Spouse name: Jacob Baker   Number of children: 4   Years of education: Not on file   Highest education level: Not on file  Occupational History   Not on file  Tobacco Use   Smoking status: Former    Packs/day: 2.00    Years: 27.00    Total pack years: 54.00    Types: Cigarettes    Quit date: 12/30/2020    Years since quitting: 0.6    Passive exposure: Never   Smokeless tobacco: Never  Vaping Use   Vaping Use: Never used  Substance and Sexual Activity   Alcohol use: No   Drug use: No   Sexual activity: Not on file   Other Topics Concern   Not on file  Social History Narrative   Not on file   Social Determinants of Health   Financial Resource Strain: Not on file  Food Insecurity: Not on file  Transportation Needs: Not on file  Physical Activity: Not on file  Stress: Not on file  Social Connections: Not on file    Family History  Problem Relation Age of Onset   Hypertension Mother    Heart disease Father        CAD   Hypertension Father    Diabetes Father    Hypertension Sister    Hypertension Brother    Hypertension Brother     Outpatient Encounter Medications as of 09/04/2021  Medication Sig   albuterol (VENTOLIN HFA) 108 (90 Base) MCG/ACT inhaler Inhale 2 puffs into the lungs every 6 (six) hours as needed for wheezing or shortness of breath.   aspirin 81 MG EC tablet Take 1 tablet (81 mg total) by mouth daily with breakfast. Swallow whole.   Budeson-Glycopyrrol-Formoterol (BREZTRI AEROSPHERE) 160-9-4.8 MCG/ACT AERO Inhale 2 puffs into the lungs 2 (two) times daily.   glipiZIDE (GLUCOTROL) 10 MG tablet Take 10 mg by mouth daily before breakfast. (Patient not taking: Reported on 09/04/2021)   glucose blood (ONETOUCH VERIO) test strip Test sugars twice daily   HYDROcodone-acetaminophen (NORCO/VICODIN) 5-325 MG tablet Take 1-2 tablets by mouth every 4 (four) hours as needed for moderate pain.   magnesium hydroxide (MILK OF MAGNESIA) 400 MG/5ML suspension Take by mouth daily as needed for mild constipation.   metFORMIN (GLUCOPHAGE) 850 MG tablet TAKE 1 TABLET(850 MG) BY MOUTH TWICE DAILY WITH A MEAL   metoprolol tartrate (LOPRESSOR) 25 MG tablet Take 1 tablet (25 mg total) by mouth 2 (two) times daily.   nitroGLYCERIN (NITROSTAT) 0.4 MG SL tablet Place 1 tablet (0.4 mg total) under the tongue every 5 (five) minutes as needed for chest pain.   ONETOUCH DELICA LANCETS 62Z MISC Test sugars twice daily   pantoprazole (PROTONIX) 40 MG tablet TAKE 1 TABLET(40 MG) BY MOUTH TWICE DAILY   Plecanatide  (TRULANCE) 3 MG TABS Take 1 tablet by mouth daily.   ranolazine (RANEXA) 500 MG 12 hr tablet Take 500 mg by mouth 2 (two) times daily.   rosuvastatin (CRESTOR) 40 MG tablet Take 40 mg by mouth daily.   ticagrelor (BRILINTA) 90 MG TABS tablet Take 1 tablet (90 mg total) by mouth 2 (two) times daily.   vitamin B-12 (CYANOCOBALAMIN) 1000 MCG tablet Take 1,000 mcg by mouth daily.   No facility-administered encounter medications on file as of 09/04/2021.    ALLERGIES: No Known Allergies  VACCINATION STATUS: Immunization History  Administered Date(s) Administered   Influenza Split 12/15/2013  Influenza,inj,Quad PF,6+ Mos 10/26/2014, 10/14/2015, 10/13/2017, 10/18/2018, 10/20/2019, 10/21/2020   Influenza-Unspecified 09/26/2016   Moderna Sars-Covid-2 Vaccination 04/28/2019, 05/30/2019   Pneumococcal Polysaccharide-23 10/14/2015   Tdap 10/26/2014    Diabetes He presents for his follow-up diabetic visit. He has type 2 diabetes mellitus. His disease course has been stable. There are no hypoglycemic associated symptoms. Pertinent negatives for hypoglycemia include no confusion, headaches, pallor or seizures. Pertinent negatives for diabetes include no chest pain, no fatigue, no polydipsia, no polyphagia, no polyuria and no weakness. Symptoms are stable. Diabetic complications include heart disease. (In the interim , he developed chest pain, hospitalized and diagnosed with ST Elevation MI. He is s/p stent placement.) Risk factors for coronary artery disease include dyslipidemia, diabetes mellitus, male sex, family history and tobacco exposure. Current diabetic treatment includes oral agent (triple therapy). His weight is stable. He is following a generally unhealthy diet. When asked about meal planning, he reported none. He has not had a previous visit with a dietitian. He rarely participates in exercise. His home blood glucose trend is decreasing steadily. (He presents with his meter showing average blood  glucose of  127.  His previsit labs show A1c of 4.8% improving from 8%.  He did not document hypoglycemia.   )  Hyperlipidemia This is a chronic problem. The current episode started more than 1 year ago. The problem is uncontrolled. Exacerbating diseases include diabetes. Pertinent negatives include no chest pain, myalgias or shortness of breath. Current antihyperlipidemic treatment includes statins. Risk factors for coronary artery disease include diabetes mellitus, dyslipidemia, male sex and family history.  Hypertension This is a chronic problem. The current episode started more than 1 year ago. The problem is controlled. Pertinent negatives include no chest pain, headaches, neck pain, palpitations or shortness of breath. Risk factors for coronary artery disease include family history, dyslipidemia, diabetes mellitus, male gender and smoking/tobacco exposure. Past treatments include calcium channel blockers. Hypertensive end-organ damage includes CAD/MI.     Review of Systems  Constitutional:  Negative for chills, fatigue, fever and unexpected weight change.  HENT:  Negative for dental problem, mouth sores and trouble swallowing.   Eyes:  Negative for visual disturbance.  Respiratory:  Negative for cough, choking, chest tightness, shortness of breath and wheezing.   Cardiovascular:  Negative for chest pain, palpitations and leg swelling.  Gastrointestinal:  Negative for abdominal distention, abdominal pain, constipation, diarrhea, nausea and vomiting.  Endocrine: Negative for polydipsia, polyphagia and polyuria.  Genitourinary:  Negative for dysuria, flank pain, hematuria and urgency.  Musculoskeletal:  Negative for back pain, gait problem, myalgias and neck pain.  Skin:  Negative for pallor, rash and wound.  Neurological:  Negative for seizures, syncope, weakness, numbness and headaches.  Psychiatric/Behavioral:  Negative for confusion and dysphoric mood.     Objective:    BP 100/64    Pulse 72   Ht '6\' 1"'$  (1.854 m)   Wt 188 lb 9.6 oz (85.5 kg)   BMI 24.88 kg/m   Wt Readings from Last 3 Encounters:  09/04/21 188 lb 9.6 oz (85.5 kg)  08/18/21 195 lb 6.4 oz (88.6 kg)  07/15/21 187 lb 3.2 oz (84.9 kg)       CMP ( most recent) CMP     Component Value Date/Time   NA 140 08/18/2021 1613   NA 142 08/21/2020 0824   NA 138 10/04/2013 2025   K 3.8 08/18/2021 1613   K 3.0 (L) 10/04/2013 2025   CL 104 08/18/2021 1613   CL 104  10/04/2013 2025   CO2 22 08/18/2021 1613   CO2 25 10/04/2013 2025   GLUCOSE 117 (H) 08/18/2021 1613   GLUCOSE 175 (H) 10/04/2013 2025   BUN 12 08/18/2021 1613   BUN 12 08/21/2020 0824   BUN 13 10/04/2013 2025   CREATININE 1.17 08/18/2021 1613   CREATININE 1.04 11/16/2019 1505   CALCIUM 9.8 08/18/2021 1613   CALCIUM 9.2 10/04/2013 2025   PROT 6.9 08/18/2021 1613   PROT 7.0 08/21/2020 0824   PROT 7.4 10/04/2013 2025   ALBUMIN 5.0 08/18/2021 1613   ALBUMIN 4.7 08/21/2020 0824   ALBUMIN 4.1 10/04/2013 2025   AST 10 08/18/2021 1613   AST 8 (L) 10/04/2013 2025   ALT 15 08/18/2021 1613   ALT 19 10/04/2013 2025   ALKPHOS 78 08/18/2021 1613   ALKPHOS 114 10/04/2013 2025   BILITOT 0.9 08/18/2021 1613   BILITOT 0.5 08/21/2020 0824   BILITOT 0.4 10/04/2013 2025   GFRNONAA >60 06/30/2021 0857   GFRNONAA 83 11/16/2019 1505   GFRAA 96 11/16/2019 1505     Diabetic Labs (most recent): Lab Results  Component Value Date   HGBA1C 5.7 (H) 06/30/2021   HGBA1C 4.9 03/28/2021   HGBA1C 7.4 (H) 12/31/2020   MICROALBUR 30 05/22/2020   MICROALBUR 2.6 10/20/2019   MICROALBUR 1.8 03/10/2019     Lipid Panel ( most recent) Lipid Panel     Component Value Date/Time   CHOL 106 08/18/2021 1613   CHOL 192 08/21/2020 0824   CHOL 180 03/23/2013 0422   TRIG 254 (H) 08/18/2021 1613   TRIG 218 (H) 03/23/2013 0422   HDL 30 (L) 08/18/2021 1613   HDL 28 (L) 08/21/2020 0824   HDL 21 (L) 03/23/2013 0422   CHOLHDL 3.5 08/18/2021 1613   VLDL 31 03/28/2021  0852   VLDL 44 (H) 03/23/2013 0422   LDLCALC 46 08/18/2021 1613   LDLCALC 115 (H) 03/23/2013 0422   LDLDIRECT 176.0 10/18/2018 1406   LABVLDL 31 08/21/2020 0824      Lab Results  Component Value Date   TSH 1.49 08/18/2021   TSH 1.32 11/16/2019   TSH 1.38 10/18/2018   TSH 1.07 06/11/2016   TSH 1.66 03/12/2014   TSH 1.140 11/24/2013   TSH 1.42 02/25/2012   FREET4 1.2 11/16/2019   FREET4 1.00 04/23/2014      Assessment & Plan:   1. Uncontrolled type 2 diabetes mellitus with CAD - Jacob Baker has currently uncontrolled symptomatic type 2 DM since  51 years of age.  He presents controlled glycemia, previsit labs show A1c of 5.7%.  He did not document any hypoglycemia.      Recent labs reviewed, showing normal renal function. - I had a long discussion with him about the progressive nature of diabetes and the pathology behind its complications. -his diabetes is complicated by CAD s/p stent placement, smoking and he remains at a high risk for more acute and chronic complications which include CVA, CKD, retinopathy, and neuropathy. These are all discussed in detail with him.  - I have counseled him on diet  and weight management  by adopting a carbohydrate restricted/protein rich diet. Patient is encouraged to switch to  unprocessed or minimally processed     complex starch and increased protein intake (animal or plant source), fruits, and vegetables. -  he is advised to stick to a routine mealtimes to eat 3 meals  a day and avoid unnecessary snacks ( to snack only to correct hypoglycemia).   -  he acknowledges that there is a room for improvement in his food and drink choices. - Suggestion is made for him to avoid simple carbohydrates  from his diet including Cakes, Sweet Desserts, Ice Cream, Soda (diet and regular), Sweet Tea, Candies, Chips, Cookies, Store Bought Juices, Alcohol , Artificial Sweeteners,  Coffee Creamer, and "Sugar-free" Products, Lemonade. This will help patient to  have more stable blood glucose profile and potentially avoid unintended weight gain.  The following Lifestyle Medicine recommendations according to [place]  [facility]) were discussed and and offered to patient and he  agrees to start the journey:  A. Whole Foods, Plant-Based Nutrition comprising of fruits and vegetables, plant-based proteins, whole-grain carbohydrates was discussed in detail with the patient.   A list for source of those nutrients were also provided to the patient.  Patient will use only water or unsweetened tea for hydration. B.  The need to stay away from risky substances including alcohol, smoking; obtaining 7 to 9 hours of restorative sleep, at least 150 minutes of moderate intensity exercise weekly, the importance of healthy social connections,  and stress management techniques were discussed. C.  A full color page of  Calorie density of various food groups per pound showing examples of each food groups was provided to the patient.     - he will be scheduled with Jearld Fenton, RDN, CDE for diabetes education.  - I have approached him with the following individualized plan to manage  his diabetes and patient agrees:   -He has responded and continues to respond to lifestyle medicine.  He is advised to continue metformin 850 mg p.o. daily breakfast.  He was taken off of Farxiga and glipizide. -He is approached and willing to start monitoring blood glucose at least twice a day-daily before breakfast and at bed time and  report if fasting blood glucose is greater than 200 or less than 70.    - Specific targets for  A1c;  LDL, HDL, Triglycerides, and  Waist Circumference were discussed with the patient.  2) Blood Pressure /Hypertension: -His blood pressure is controlled to target.  He is advised to continue metoprolol 25 mg p.o. twice daily and lisinopril 20 mg p.o. daily.  During his next visit, he will be considered for stopping metoprolol.       3) Lipids/Hyperlipidemia:   Review of his recent repeat lipid panel showed significantly improved LDL of 63 from 133.  He was advised to continue Crestor 40 mg p.o. nightly.  Side effects and precautions discussed with him.     4)  Weight/Diet:  Body mass index is 24.88 kg/m.  -If patient has 25+ pounds, he is not a candidate for major weight loss.  Exercise, and detailed carbohydrates information provided  -  detailed on discharge instructions.  5) Chronic Care/Health Maintenance:  -he  is on  Statin medications and  is encouraged to initiate and continue to follow up with Ophthalmology, Dentist,  Podiatrist at least yearly or according to recommendations, and has successfully quit smoking.    -  I have recommended yearly flu vaccine and pneumonia vaccine at least every 5 years; moderate intensity exercise for up to 150 minutes weekly; and  sleep for at least 7 hours a day.  - he is  advised to maintain close follow up with [name], MD for primary care needs, as well as his other providers for optimal and coordinated care.   I spent 41 minutes in the care  of the patient today including review of labs from [place], Lipids, Thyroid Function, Hematology (current and previous including abstractions from other facilities); face-to-face time discussing  his blood glucose readings/logs, discussing hypoglycemia and hyperglycemia episodes and symptoms, medications doses, his options of short and long term treatment based on the latest standards of care / guidelines;  discussion about incorporating lifestyle medicine;  and documenting the encounter. Risk reduction counseling performed per USPSTF guidelines to reduce obesity and cardiovascular risk factors.     Please refer to Patient Instructions for Blood Glucose Monitoring and Insulin/Medications Dosing Guide"  in media tab for additional information. Please  also refer to " Patient Self Inventory" in the Media  tab for reviewed elements of  pertinent patient history.  Jacob Baker participated in the discussions, expressed understanding, and voiced agreement with the above plans.  All questions were answered to his satisfaction. he is encouraged to contact clinic should he have any questions or concerns prior to his return visit.    Follow up plan: - Return in about 6 months (around 03/07/2022) for F/U with Pre-visit Labs, A1c -NV.  Glade Lloyd, MD [facility] [address] [place], [place] [address] Phone: [phone]  Fax: [phone]    09/04/2021, 6:00 PM  This note was partially dictated with voice recognition software. Similar sounding words can be transcribed inadequately or may not  be corrected upon review.

## 2021-09-04 NOTE — Patient Instructions (Signed)

## 2021-09-05 ENCOUNTER — Encounter
Admission: RE | Admit: 2021-09-05 | Discharge: 2021-09-05 | Disposition: A | Discharge status: Home / Self Care | Payer: BC Managed Care – PPO | Source: Ambulatory Visit | Attending: Internal Medicine | Admitting: Internal Medicine

## 2021-09-05 DIAGNOSIS — K30 Functional dyspepsia: Secondary | ICD-10-CM | POA: Diagnosis not present

## 2021-09-05 DIAGNOSIS — R6881 Early satiety: Secondary | ICD-10-CM | POA: Insufficient documentation

## 2021-09-05 DIAGNOSIS — K5904 Chronic idiopathic constipation: Secondary | ICD-10-CM | POA: Insufficient documentation

## 2021-09-05 MED ORDER — TECHNETIUM TC 99M SULFUR COLLOID
1.8600 | Freq: Once | INTRAVENOUS | Status: AC
  Administered 2021-09-05: 1.86 via ORAL

## 2021-09-05 MED ORDER — TECHNETIUM TC 99M SULFUR COLLOID
1.8600 | Freq: Once | INTRAVENOUS | Status: DC | PRN
Start: 2021-09-05 — End: 2021-09-06

## 2021-09-11 ENCOUNTER — Telehealth: Payer: Self-pay

## 2021-09-11 NOTE — Telephone Encounter (Signed)
Patient states he is calling to see if we have the results of his nuclear gastro visit.

## 2021-09-12 ENCOUNTER — Encounter: Payer: Self-pay | Admitting: Pulmonary Disease

## 2021-09-12 ENCOUNTER — Ambulatory Visit (INDEPENDENT_AMBULATORY_CARE_PROVIDER_SITE_OTHER): Payer: BC Managed Care – PPO | Admitting: Pulmonary Disease

## 2021-09-12 ENCOUNTER — Ambulatory Visit (INDEPENDENT_AMBULATORY_CARE_PROVIDER_SITE_OTHER): Payer: BC Managed Care – PPO

## 2021-09-12 VITALS — BP 100/68 | HR 73 | Ht 73.0 in | Wt 188.6 lb

## 2021-09-12 DIAGNOSIS — R0683 Snoring: Secondary | ICD-10-CM

## 2021-09-12 DIAGNOSIS — I2510 Atherosclerotic heart disease of native coronary artery without angina pectoris: Secondary | ICD-10-CM | POA: Diagnosis not present

## 2021-09-12 DIAGNOSIS — R0602 Shortness of breath: Secondary | ICD-10-CM | POA: Diagnosis not present

## 2021-09-12 DIAGNOSIS — J449 Chronic obstructive pulmonary disease, unspecified: Secondary | ICD-10-CM

## 2021-09-12 MED ORDER — BREZTRI AEROSPHERE 160-9-4.8 MCG/ACT IN AERO
2.0000 | INHALATION_SPRAY | Freq: Two times a day (BID) | RESPIRATORY_TRACT | 0 refills | Status: DC
Start: 2021-09-12 — End: 2022-04-14

## 2021-09-12 NOTE — Addendum Note (Signed)
Addended by: Valerie Salts on: 09/12/2021 05:07 PM   Modules accepted: Orders

## 2021-09-12 NOTE — Progress Notes (Signed)
Synopsis: Referred in August 2023 for Dyspnea, former cigarette smoker smoked 1.5-2 ppd for 30 years, quit in 12/2020  Subjective:   PATIENT ID: Jacob Baker GENDER: male DOB: Sep 12, 1970, MRN: [id number]   HPI  Chief Complaint  Patient presents with   Follow-up    Referred by PCP for chronic cough and SOB for the past 3 months. Cough is not productive. Denies any wheezing.     Jacob Baker is here to see me for evaluation of dyspnea.  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   Records from her office visit with Dr. Derrel Nip reviewed where he was sent to see [place] for dyspnea, noted to have a history of CAD s/p PCI, recent cholecystectomy.  Past Medical History:  Diagnosis Date   (HFpEF) heart failure with preserved ejection fraction ([place])    a. 12/2020 Echo: EF 65-70%, no rwma, mild LVH, nl RV fxn, triv MR.   CAD (coronary artery disease)    a. 12/2020 NSTEMI/PCI: LM nl, LAD min irregs, D1 99 (2.0x12 Onyx Frontier DES), LCX 85p (small), RCA 80p/m (4.0x18 Onyx Frontier DES - staged following day), 60d, RPDA mod dzs. EF 50-55%.   DVT (deep venous thrombosis) ([place]) 04/2017   GERD (gastroesophageal reflux disease)    History of migraine    Hyperlipidemia    Hypertension    Long term current use of antithrombotics/antiplatelets    a.) on daily DAPT  therapy (ticagrelor + ASA)   Non-STEMI (non-ST elevated myocardial infarction) ([place]) 12/30/2020   Syncope and collapse    a.) single episode; related to dehydration   T2DM (type 2 diabetes mellitus) ([place])      Family History  Problem Relation Age of Onset   Hypertension Mother    Heart disease Father        CAD   Hypertension Father    Diabetes Father    Hypertension Sister    Hypertension Brother    Hypertension Brother      Social History   Socioeconomic History   Marital status: Married    Spouse name: Larene Beach   Number of children: 4   Years of education: Not on file   Highest education level: Not on file  Occupational History   Not on file  Tobacco Use   Smoking status: Former    Packs/day: 2.00    Years: 27.00    Total pack years: 54.00    Types: Cigarettes    Quit date: 12/30/2020    Years since quitting: 0.7    Passive exposure: Never   Smokeless tobacco: Never  Vaping Use   Vaping Use: Never used  Substance and Sexual Activity   Alcohol use: No   Drug use: No   Sexual activity: Not on  file  Other Topics Concern   Not on file  Social History Narrative   Not on file   Social Determinants of Health   Financial Resource Strain: Not on file  Food Insecurity: Not on file  Transportation Needs: Not on file  Physical Activity: Not on file  Stress: Not on file  Social Connections: Not on file  Intimate Partner Violence: Not on file     No Known Allergies   Outpatient Medications Prior to Visit  Medication Sig Dispense Refill   albuterol (VENTOLIN HFA) 108 (90 Base) MCG/ACT inhaler Inhale 2 puffs into the lungs every 6 (six) hours as needed for wheezing or shortness of breath. 8 g 1   aspirin 81 MG EC tablet Take 1 tablet (81 mg total) by mouth daily with breakfast. Swallow whole. 90 tablet 3   Budeson-Glycopyrrol-Formoterol (BREZTRI AEROSPHERE) 160-9-4.8 MCG/ACT AERO Inhale 2 puffs into the lungs 2 (two) times daily. 10.7 g 11   glipiZIDE (GLUCOTROL)  10 MG tablet Take 10 mg by mouth daily before breakfast.     glucose blood (ONETOUCH VERIO) test strip Test sugars twice daily 50 each 6   HYDROcodone-acetaminophen (NORCO/VICODIN) 5-325 MG tablet Take 1-2 tablets by mouth every 4 (four) hours as needed for moderate pain. 25 tablet 0   magnesium hydroxide (MILK OF MAGNESIA) 400 MG/5ML suspension Take by mouth daily as needed for mild constipation.     metFORMIN (GLUCOPHAGE) 850 MG tablet TAKE 1 TABLET(850 MG) BY MOUTH TWICE DAILY WITH A MEAL 180 tablet 1   metoprolol tartrate (LOPRESSOR) 25 MG tablet Take 1 tablet (25 mg total) by mouth 2 (two) times daily. 60 tablet 11   nitroGLYCERIN (NITROSTAT) 0.4 MG SL tablet Place 1 tablet (0.4 mg total) under the tongue every 5 (five) minutes as needed for chest pain. 25 tablet 1   ONETOUCH DELICA LANCETS 28B MISC Test sugars twice daily 50 each 3   pantoprazole (PROTONIX) 40 MG tablet TAKE 1 TABLET(40 MG) BY MOUTH TWICE DAILY 180 tablet 3   Plecanatide (TRULANCE) 3 MG TABS Take 1 tablet by mouth daily. 30 tablet 6   ranolazine (RANEXA) 500 MG 12 hr tablet Take 500 mg by mouth 2 (two) times daily.     rosuvastatin (CRESTOR) 40 MG tablet Take 40 mg by mouth daily.     ticagrelor (BRILINTA) 90 MG TABS tablet Take 1 tablet (90 mg total) by mouth 2 (two) times daily. 60 tablet 11   vitamin B-12 (CYANOCOBALAMIN) 1000 MCG tablet Take 1,000 mcg by mouth daily.     No facility-administered medications prior to visit.    Review of Systems  Constitutional:  Negative for chills, fever, malaise/fatigue and weight loss.  HENT:  Negative for congestion, nosebleeds, sinus pain and sore throat.   Eyes:  Negative for photophobia, pain and discharge.  Respiratory:  Positive for cough and shortness of breath. Negative for hemoptysis, sputum production and wheezing.   Cardiovascular:  Negative for chest pain, palpitations, orthopnea and leg swelling.  Gastrointestinal:  Negative for abdominal pain, constipation, diarrhea,  nausea and vomiting.  Genitourinary:  Negative for dysuria, frequency, hematuria and urgency.  Musculoskeletal:  Negative for back pain, joint pain, myalgias and neck pain.  Skin:  Negative for itching and rash.  Neurological:  Negative for tingling, tremors, sensory change, speech change, focal weakness, seizures, weakness and headaches.  Psychiatric/Behavioral:  Negative for memory loss, substance abuse and suicidal ideas. The patient is not nervous/anxious.       Objective:  Physical  Exam   Vitals:   09/12/21 1603  BP: 100/68  Pulse: 73  SpO2: 99%  Weight: 188 lb 9.6 oz (85.5 kg)  Height: '6\' 1"'$  (1.854 m)   RA   Gen: well appearing, no acute distress HENT: NCAT, OP clear, neck supple without masses Eyes: PERRL, EOMi Lymph: no cervical lymphadenopathy PULM: CTA B CV: RRR, no mgr, no JVD GI: BS+, soft, nontender, no hsm Derm: no rash or skin breakdown MSK: normal bulk and tone Neuro: A&Ox4, CN II-XII intact, strength 5/5 in all 4 extremities Psyche: normal mood and affect   CBC    Component Value Date/Time   WBC 6.5 08/18/2021 1613   RBC 3.72 (L) 08/18/2021 1613   HGB 10.6 (L) 08/18/2021 1613   HGB 13.6 10/04/2013 2025   HCT 30.2 (L) 08/18/2021 1613   HCT 39.4 (L) 10/04/2013 2025   PLT 250.0 08/18/2021 1613   PLT 234 10/04/2013 2025   MCV 81.2 08/18/2021 1613   MCV 84 10/04/2013 2025   MCH 27.6 06/30/2021 0857   MCHC 35.2 08/18/2021 1613   RDW 15.5 08/18/2021 1613   RDW 14.1 10/04/2013 2025   LYMPHSABS 1.3 08/18/2021 1613   LYMPHSABS 1.2 10/04/2013 2025   MONOABS 0.5 08/18/2021 1613   MONOABS 0.6 10/04/2013 2025   EOSABS 0.1 08/18/2021 1613   EOSABS 0.2 10/04/2013 2025   BASOSABS 0.1 08/18/2021 1613   BASOSABS 0.0 10/04/2013 2025     Chest imaging: 12/2020 CTA chest > no PE, fatty liver, subpleural lymph nodes noted, unchanged compared to 2018; pulmonary parenchyma unremarkable  PFT:  Labs:  Path:  Echo: 12/2020 TTE> LVEF 65-70%, RV size and  function normal, Valves OK  Heart Catheterization:  Nuclear stress test 08/12/2021   The study is normal. The study is low risk.   No ST deviation was noted.   LV perfusion is normal.   Left ventricular function is normal. Nuclear stress EF: 63 %. The left ventricular ejection fraction is normal (55-65%). End diastolic cavity size is normal.     Assessment & Plan:   No diagnosis found.  Discussion: 51 year old male with a significant smoking history and known coronary artery disease presents today for evaluation of shortness of breath in clinic.  He had difficulty with spirometry testing today but it was suggestive of airflow obstruction.  This would be in keeping with a diagnosis of COPD which makes sense considering his long cigarette exposure.  I am also worried about the possibility of environmental lung disease so I think it is worthwhile for [place] to get a chest x-ray and a full pulmonary function test.  If this is suggestive of interstitial lung disease then he would need to have a high-resolution CT scan of the chest eventually.  Plan: COPD: Today's lung function testing was suggestive of a diagnosis of COPD I would like to confirm the diagnosis with a full pulmonary function test Try taking Breztri 2 puffs twice a day, samples given today Use albuterol 2 puffs every 4-6 hours as needed for chest tightness wheezing or shortness of breath Chest x-ray today   Shortness of breath: We will get the chest x-ray and lung function test as arranged above If those tests are abnormal we may need to get a CT scan of the chest to see if the concrete exposure you have had over the years has caused any damage to your lungs   Trouble breathing while sleeping and snoring: I'm worried about sleep apnea We will arrange for a  home sleep study  Follow up in 4-6 weeks with NP    Current Outpatient Medications:    albuterol (VENTOLIN HFA) 108 (90 Base) MCG/ACT inhaler, Inhale 2 puffs into the lungs  every 6 (six) hours as needed for wheezing or shortness of breath., Disp: 8 g, Rfl: 1   aspirin 81 MG EC tablet, Take 1 tablet (81 mg total) by mouth daily with breakfast. Swallow whole., Disp: 90 tablet, Rfl: 3   Budeson-Glycopyrrol-Formoterol (BREZTRI AEROSPHERE) 160-9-4.8 MCG/ACT AERO, Inhale 2 puffs into the lungs 2 (two) times daily., Disp: 10.7 g, Rfl: 11   glipiZIDE (GLUCOTROL) 10 MG tablet, Take 10 mg by mouth daily before breakfast., Disp: , Rfl:    glucose blood (ONETOUCH VERIO) test strip, Test sugars twice daily, Disp: 50 each, Rfl: 6   HYDROcodone-acetaminophen (NORCO/VICODIN) 5-325 MG tablet, Take 1-2 tablets by mouth every 4 (four) hours as needed for moderate pain., Disp: 25 tablet, Rfl: 0   magnesium hydroxide (MILK OF MAGNESIA) 400 MG/5ML suspension, Take by mouth daily as needed for mild constipation., Disp: , Rfl:    metFORMIN (GLUCOPHAGE) 850 MG tablet, TAKE 1 TABLET(850 MG) BY MOUTH TWICE DAILY WITH A MEAL, Disp: 180 tablet, Rfl: 1   metoprolol tartrate (LOPRESSOR) 25 MG tablet, Take 1 tablet (25 mg total) by mouth 2 (two) times daily., Disp: 60 tablet, Rfl: 11   nitroGLYCERIN (NITROSTAT) 0.4 MG SL tablet, Place 1 tablet (0.4 mg total) under the tongue every 5 (five) minutes as needed for chest pain., Disp: 25 tablet, Rfl: 1   ONETOUCH DELICA LANCETS 44I MISC, Test sugars twice daily, Disp: 50 each, Rfl: 3   pantoprazole (PROTONIX) 40 MG tablet, TAKE 1 TABLET(40 MG) BY MOUTH TWICE DAILY, Disp: 180 tablet, Rfl: 3   Plecanatide (TRULANCE) 3 MG TABS, Take 1 tablet by mouth daily., Disp: 30 tablet, Rfl: 6   ranolazine (RANEXA) 500 MG 12 hr tablet, Take 500 mg by mouth 2 (two) times daily., Disp: , Rfl:    rosuvastatin (CRESTOR) 40 MG tablet, Take 40 mg by mouth daily., Disp: , Rfl:    ticagrelor (BRILINTA) 90 MG TABS tablet, Take 1 tablet (90 mg total) by mouth 2 (two) times daily., Disp: 60 tablet, Rfl: 11   vitamin B-12 (CYANOCOBALAMIN) 1000 MCG tablet, Take 1,000 mcg by mouth  daily., Disp: , Rfl:

## 2021-09-12 NOTE — Patient Instructions (Signed)
COPD: Today's lung function testing was suggestive of a diagnosis of COPD I would like to confirm the diagnosis with a full pulmonary function test Try taking Breztri 2 puffs twice a day, samples given today Use albuterol 2 puffs every 4-6 hours as needed for chest tightness wheezing or shortness of breath Chest x-ray today   Shortness of breath: We will get the chest x-ray and lung function test as arranged above If those tests are abnormal we may need to get a CT scan of the chest to see if the concrete exposure you have had over the years has caused any damage to your lungs   Trouble breathing while sleeping and snoring: I'm worried about sleep apnea We will arrange for a home sleep study  Follow up in 4-6 weeks with NP

## 2021-09-12 NOTE — Progress Notes (Signed)
Synopsis: Referred in August 2023 for Dyspnea, former cigarette smoker smoked 1.5-2 ppd for 30 years, quit in 12/2020  Subjective:   PATIENT ID: [name] GENDER: male DOB: 01/02/1971, MRN: [id number]   HPI  Chief Complaint  Patient presents with   Follow-up    Referred by PCP for chronic cough and SOB for the past 3 months. Cough is not productive. Denies any wheezing.     Amori is here to see me for evaluation of dyspnea.  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   Records from her office visit with Dr. Derrel Nip reviewed where he was sent to see [place] for dyspnea, noted to have a history of CAD s/p PCI, recent cholecystectomy.  Past Medical History:  Diagnosis Date   (HFpEF) heart failure with preserved ejection fraction ([place])    a. 12/2020 Echo: EF 65-70%, no rwma, mild LVH, nl RV fxn, triv MR.   CAD (coronary artery disease)    a. 12/2020 NSTEMI/PCI: LM nl, LAD min irregs, D1 99 (2.0x12 Onyx Frontier DES), LCX 85p (small), RCA 80p/m (4.0x18 Onyx Frontier DES - staged following day), 60d, RPDA mod dzs. EF 50-55%.   DVT (deep venous thrombosis) ([place]) 04/2017   GERD (gastroesophageal reflux disease)    History of migraine    Hyperlipidemia    Hypertension    Long term current use of antithrombotics/antiplatelets    a.) on daily DAPT  therapy (ticagrelor + ASA)   Non-STEMI (non-ST elevated myocardial infarction) ([place]) 12/30/2020   Syncope and collapse    a.) single episode; related to dehydration   T2DM (type 2 diabetes mellitus) ([place])      Family History  Problem Relation Age of Onset   Hypertension Mother    Heart disease Father        CAD   Hypertension Father    Diabetes Father    Hypertension Sister    Hypertension Brother    Hypertension Brother      Social History   Socioeconomic History   Marital status: Married    Spouse name: Larene Beach   Number of children: 4   Years of education: Not on file   Highest education level: Not on file  Occupational History   Not on file  Tobacco Use   Smoking status: Former    Packs/day: 2.00    Years: 27.00    Total pack years: 54.00    Types: Cigarettes    Quit date: 12/30/2020    Years since quitting: 0.7    Passive exposure: Never   Smokeless tobacco: Never  Vaping Use   Vaping Use: Never used  Substance and Sexual Activity   Alcohol use: No   Drug use: No   Sexual activity: Not on  file  Other Topics Concern   Not on file  Social History Narrative   Not on file   Social Determinants of Health   Financial Resource Strain: Not on file  Food Insecurity: Not on file  Transportation Needs: Not on file  Physical Activity: Not on file  Stress: Not on file  Social Connections: Not on file  Intimate Partner Violence: Not on file     No Known Allergies   Outpatient Medications Prior to Visit  Medication Sig Dispense Refill   albuterol (VENTOLIN HFA) 108 (90 Base) MCG/ACT inhaler Inhale 2 puffs into the lungs every 6 (six) hours as needed for wheezing or shortness of breath. 8 g 1   aspirin 81 MG EC tablet Take 1 tablet (81 mg total) by mouth daily with breakfast. Swallow whole. 90 tablet 3   Budeson-Glycopyrrol-Formoterol (BREZTRI AEROSPHERE) 160-9-4.8 MCG/ACT AERO Inhale 2 puffs into the lungs 2 (two) times daily. 10.7 g 11   glipiZIDE (GLUCOTROL)  10 MG tablet Take 10 mg by mouth daily before breakfast.     glucose blood (ONETOUCH VERIO) test strip Test sugars twice daily 50 each 6   HYDROcodone-acetaminophen (NORCO/VICODIN) 5-325 MG tablet Take 1-2 tablets by mouth every 4 (four) hours as needed for moderate pain. 25 tablet 0   magnesium hydroxide (MILK OF MAGNESIA) 400 MG/5ML suspension Take by mouth daily as needed for mild constipation.     metFORMIN (GLUCOPHAGE) 850 MG tablet TAKE 1 TABLET(850 MG) BY MOUTH TWICE DAILY WITH A MEAL 180 tablet 1   metoprolol tartrate (LOPRESSOR) 25 MG tablet Take 1 tablet (25 mg total) by mouth 2 (two) times daily. 60 tablet 11   nitroGLYCERIN (NITROSTAT) 0.4 MG SL tablet Place 1 tablet (0.4 mg total) under the tongue every 5 (five) minutes as needed for chest pain. 25 tablet 1   ONETOUCH DELICA LANCETS 31D MISC Test sugars twice daily 50 each 3   pantoprazole (PROTONIX) 40 MG tablet TAKE 1 TABLET(40 MG) BY MOUTH TWICE DAILY 180 tablet 3   Plecanatide (TRULANCE) 3 MG TABS Take 1 tablet by mouth daily. 30 tablet 6   ranolazine (RANEXA) 500 MG 12 hr tablet Take 500 mg by mouth 2 (two) times daily.     rosuvastatin (CRESTOR) 40 MG tablet Take 40 mg by mouth daily.     ticagrelor (BRILINTA) 90 MG TABS tablet Take 1 tablet (90 mg total) by mouth 2 (two) times daily. 60 tablet 11   vitamin B-12 (CYANOCOBALAMIN) 1000 MCG tablet Take 1,000 mcg by mouth daily.     No facility-administered medications prior to visit.    Review of Systems  Constitutional:  Negative for chills, fever, malaise/fatigue and weight loss.  HENT:  Negative for congestion, nosebleeds, sinus pain and sore throat.   Eyes:  Negative for photophobia, pain and discharge.  Respiratory:  Positive for cough and shortness of breath. Negative for hemoptysis, sputum production and wheezing.   Cardiovascular:  Negative for chest pain, palpitations, orthopnea and leg swelling.  Gastrointestinal:  Negative for abdominal pain, constipation, diarrhea,  nausea and vomiting.  Genitourinary:  Negative for dysuria, frequency, hematuria and urgency.  Musculoskeletal:  Negative for back pain, joint pain, myalgias and neck pain.  Skin:  Negative for itching and rash.  Neurological:  Negative for tingling, tremors, sensory change, speech change, focal weakness, seizures, weakness and headaches.  Psychiatric/Behavioral:  Negative for memory loss, substance abuse and suicidal ideas. The patient is not nervous/anxious.       Objective:  Physical  Exam   Vitals:   09/12/21 1603  BP: 100/68  Pulse: 73  SpO2: 99%  Weight: 188 lb 9.6 oz (85.5 kg)  Height: '6\' 1"'$  (1.854 m)   RA   Gen: well appearing, no acute distress HENT: NCAT, OP clear, neck supple without masses Eyes: PERRL, EOMi Lymph: no cervical lymphadenopathy PULM: CTA B CV: RRR, no mgr, no JVD GI: BS+, soft, nontender, no hsm Derm: no rash or skin breakdown MSK: normal bulk and tone Neuro: A&Ox4, CN II-XII intact, strength 5/5 in all 4 extremities Psyche: normal mood and affect   CBC    Component Value Date/Time   WBC 6.5 08/18/2021 1613   RBC 3.72 (L) 08/18/2021 1613   HGB 10.6 (L) 08/18/2021 1613   HGB 13.6 10/04/2013 2025   HCT 30.2 (L) 08/18/2021 1613   HCT 39.4 (L) 10/04/2013 2025   PLT 250.0 08/18/2021 1613   PLT 234 10/04/2013 2025   MCV 81.2 08/18/2021 1613   MCV 84 10/04/2013 2025   MCH 27.6 06/30/2021 0857   MCHC 35.2 08/18/2021 1613   RDW 15.5 08/18/2021 1613   RDW 14.1 10/04/2013 2025   LYMPHSABS 1.3 08/18/2021 1613   LYMPHSABS 1.2 10/04/2013 2025   MONOABS 0.5 08/18/2021 1613   MONOABS 0.6 10/04/2013 2025   EOSABS 0.1 08/18/2021 1613   EOSABS 0.2 10/04/2013 2025   BASOSABS 0.1 08/18/2021 1613   BASOSABS 0.0 10/04/2013 2025     Chest imaging: 12/2020 CTA chest > no PE, fatty liver, subpleural lymph nodes noted, unchanged compared to 2018; pulmonary parenchyma unremarkable  PFT:  Labs:  Path:  Echo: 12/2020 TTE> LVEF 65-70%, RV size and  function normal, Valves OK  Heart Catheterization:  Nuclear stress test 08/12/2021   The study is normal. The study is low risk.   No ST deviation was noted.   LV perfusion is normal.   Left ventricular function is normal. Nuclear stress EF: 63 %. The left ventricular ejection fraction is normal (55-65%). End diastolic cavity size is normal.     Assessment & Plan:   SOB (shortness of breath) - Plan: Spirometry with graph, DG Chest 2 View  Coronary artery disease involving native coronary artery of native heart without angina pectoris  Snoring  COPD with chronic bronchitis and emphysema (HCC)  Discussion: 51 year old male with a significant smoking history and known coronary artery disease presents today for evaluation of shortness of breath in clinic.  He had difficulty with spirometry testing today but it was suggestive of airflow obstruction.  This would be in keeping with a diagnosis of COPD which makes sense considering his long cigarette exposure.  I am also worried about the possibility of environmental lung disease so I think it is worthwhile for [place] to get a chest x-ray and a full pulmonary function test.  If this is suggestive of interstitial lung disease then he would need to have a high-resolution CT scan of the chest eventually.  Plan: COPD: Today's lung function testing was suggestive of a diagnosis of COPD I would like to confirm the diagnosis with a full pulmonary function test Try taking Breztri 2 puffs twice a day, samples given today Use albuterol 2 puffs every 4-6 hours as needed for chest tightness wheezing or shortness of breath Chest x-ray today   Shortness of breath: We will get the chest x-ray and lung function test as arranged above If those tests are abnormal we may need to get a CT scan of the chest to see if  the concrete exposure you have had over the years has caused any damage to your lungs   Trouble breathing while sleeping and snoring: I'm worried  about sleep apnea We will arrange for a home sleep study  Follow up in 4-6 weeks with NP    Current Outpatient Medications:    albuterol (VENTOLIN HFA) 108 (90 Base) MCG/ACT inhaler, Inhale 2 puffs into the lungs every 6 (six) hours as needed for wheezing or shortness of breath., Disp: 8 g, Rfl: 1   aspirin 81 MG EC tablet, Take 1 tablet (81 mg total) by mouth daily with breakfast. Swallow whole., Disp: 90 tablet, Rfl: 3   Budeson-Glycopyrrol-Formoterol (BREZTRI AEROSPHERE) 160-9-4.8 MCG/ACT AERO, Inhale 2 puffs into the lungs 2 (two) times daily., Disp: 10.7 g, Rfl: 11   glipiZIDE (GLUCOTROL) 10 MG tablet, Take 10 mg by mouth daily before breakfast., Disp: , Rfl:    glucose blood (ONETOUCH VERIO) test strip, Test sugars twice daily, Disp: 50 each, Rfl: 6   HYDROcodone-acetaminophen (NORCO/VICODIN) 5-325 MG tablet, Take 1-2 tablets by mouth every 4 (four) hours as needed for moderate pain., Disp: 25 tablet, Rfl: 0   magnesium hydroxide (MILK OF MAGNESIA) 400 MG/5ML suspension, Take by mouth daily as needed for mild constipation., Disp: , Rfl:    metFORMIN (GLUCOPHAGE) 850 MG tablet, TAKE 1 TABLET(850 MG) BY MOUTH TWICE DAILY WITH A MEAL, Disp: 180 tablet, Rfl: 1   metoprolol tartrate (LOPRESSOR) 25 MG tablet, Take 1 tablet (25 mg total) by mouth 2 (two) times daily., Disp: 60 tablet, Rfl: 11   nitroGLYCERIN (NITROSTAT) 0.4 MG SL tablet, Place 1 tablet (0.4 mg total) under the tongue every 5 (five) minutes as needed for chest pain., Disp: 25 tablet, Rfl: 1   ONETOUCH DELICA LANCETS 53G MISC, Test sugars twice daily, Disp: 50 each, Rfl: 3   pantoprazole (PROTONIX) 40 MG tablet, TAKE 1 TABLET(40 MG) BY MOUTH TWICE DAILY, Disp: 180 tablet, Rfl: 3   Plecanatide (TRULANCE) 3 MG TABS, Take 1 tablet by mouth daily., Disp: 30 tablet, Rfl: 6   ranolazine (RANEXA) 500 MG 12 hr tablet, Take 500 mg by mouth 2 (two) times daily., Disp: , Rfl:    rosuvastatin (CRESTOR) 40 MG tablet, Take 40 mg by mouth  daily., Disp: , Rfl:    ticagrelor (BRILINTA) 90 MG TABS tablet, Take 1 tablet (90 mg total) by mouth 2 (two) times daily., Disp: 60 tablet, Rfl: 11   vitamin B-12 (CYANOCOBALAMIN) 1000 MCG tablet, Take 1,000 mcg by mouth daily., Disp: , Rfl:

## 2021-09-15 NOTE — Telephone Encounter (Signed)
Spoke with pt and informed him of his results. Pt gave a verbal understanding.

## 2021-09-16 NOTE — Progress Notes (Signed)
Called pt and left detailed msg on machine ok per DPR.

## 2021-10-21 ENCOUNTER — Ambulatory Visit (INDEPENDENT_AMBULATORY_CARE_PROVIDER_SITE_OTHER): Payer: BC Managed Care – PPO | Admitting: Internal Medicine

## 2021-10-21 ENCOUNTER — Encounter: Payer: Self-pay | Admitting: Internal Medicine

## 2021-10-21 VITALS — BP 120/72 | HR 62 | Temp 98.2°F | Ht 73.0 in | Wt 190.2 lb

## 2021-10-21 DIAGNOSIS — E785 Hyperlipidemia, unspecified: Secondary | ICD-10-CM | POA: Diagnosis not present

## 2021-10-21 DIAGNOSIS — E1169 Type 2 diabetes mellitus with other specified complication: Secondary | ICD-10-CM

## 2021-10-21 DIAGNOSIS — I25119 Atherosclerotic heart disease of native coronary artery with unspecified angina pectoris: Secondary | ICD-10-CM

## 2021-10-21 DIAGNOSIS — Z23 Encounter for immunization: Secondary | ICD-10-CM

## 2021-10-21 DIAGNOSIS — R809 Proteinuria, unspecified: Secondary | ICD-10-CM

## 2021-10-21 DIAGNOSIS — L603 Nail dystrophy: Secondary | ICD-10-CM | POA: Diagnosis not present

## 2021-10-21 DIAGNOSIS — Z125 Encounter for screening for malignant neoplasm of prostate: Secondary | ICD-10-CM

## 2021-10-21 DIAGNOSIS — Z0000 Encounter for general adult medical examination without abnormal findings: Secondary | ICD-10-CM | POA: Diagnosis not present

## 2021-10-21 DIAGNOSIS — K2960 Other gastritis without bleeding: Secondary | ICD-10-CM

## 2021-10-21 DIAGNOSIS — E1129 Type 2 diabetes mellitus with other diabetic kidney complication: Secondary | ICD-10-CM

## 2021-10-21 MED ORDER — PANTOPRAZOLE SODIUM 40 MG PO TBEC
40.0000 mg | DELAYED_RELEASE_TABLET | Freq: Every day | ORAL | 3 refills | Status: DC
Start: 2021-10-21 — End: 2022-11-11

## 2021-10-21 NOTE — Progress Notes (Addendum)
The patient is here for annual preventive examination and management of other chronic and acute problems.   The risk factors are reflected in the social history.   The roster of all physicians providing medical care to patient - is listed in the Snapshot section of the chart.   Activities of daily living:  The patient is 100% independent in all ADLs: dressing, toileting, feeding as well as independent mobility   Home safety : The patient has smoke detectors in the home. They wear seatbelts.  There are no unsecured firearms at home. There is no violence in the home.    There is no risks for hepatitis, STDs or HIV. There is no   history of blood transfusion. They have no travel history to infectious disease endemic areas of the world.   The patient has seen their dentist in the last six month. They have seen their eye doctor in the last year. The patinet  denies slight hearing difficulty with regard to whispered voices and some television programs.  They have deferred audiologic testing in the last year.  They do not  have excessive sun exposure. Discussed the need for sun protection: hats, long sleeves and use of sunscreen if there is significant sun exposure.    Diet: the importance of a healthy diet is discussed. They do have a healthy diet.   The benefits of regular aerobic exercise were discussed. The patient  exercises  3 to 5 days per week  for  60 minutes.    Depression screen: there are no signs or vegative symptoms of depression- irritability, change in appetite, anhedonia, sadness/tearfullness.   The following portions of the patient's history were reviewed and updated as appropriate: allergies, current medications, past family history, past medical history,  past surgical history, past social history  and problem list.   Visual acuity was not assessed per patient preference since the patient has regular follow up with an  ophthalmologist. Hearing and body mass index were assessed and  reviewed.    During the course of the visit the patient was educated and counseled about appropriate screening and preventive services including : fall prevention , diabetes screening, nutrition counseling, colorectal cancer screening, and recommended immunizations.    Chief Complaint:  Last seen in July,  multiple multiple issues .  Gastric emptying study normal August   chest x ray norma. Seeing pulmonary [facility]   Bowels moving regularly  on a daily  without laxative.  Avoiding red meat. Eating a lot of fruit.  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    Physical Exam:  BP 120/72 (BP Location: Left Arm, Patient Position: Sitting, Cuff Size: Large)   Pulse 62   Temp 98.2 F (36.8 C) (Oral)   Ht '6\' 1"'$  (1.854 m)   Wt 190 lb 3.2 oz (86.3 kg)   SpO2 92%   BMI 25.09 kg/m    General appearance: alert, cooperative and appears stated age Ears: normal TM's and external ear canals both ears Throat: lips, mucosa, and tongue  normal; teeth and gums normal Neck: no adenopathy, no carotid bruit, supple, symmetrical, trachea midline and thyroid not enlarged, symmetric, no tenderness/mass/nodules Back: symmetric, no curvature. ROM normal. No CVA tenderness. Lungs: clear to auscultation bilaterally Heart: regular rate and rhythm, S1, S2 normal, no murmur, click, rub or gallop Abdomen: soft, non-tender; bowel sounds normal; no masses,  no organomegaly Pulses: 2+ and  symmetric Skin: Skin color, texture, turgor normal. No rashes or lesions Lymph nodes: Cervical, supraclavicular, and axillary nodes normal.  Feet: dystrophic toe nails ,  mild swelling   Assessment and Plan:  Gastritis due to nonsteroidal anti-inflammatory drug (NSAID) He prefers to continue PPI.  Reducing dose to once daily   Encounter for preventive health examination age appropriate education and counseling updated, referrals for preventative services and immunizations addressed, dietary and smoking counseling addressed, most recent labs reviewed.  I have personally reviewed and have noted:   1) the patient's medical and social history 2) The pt's use of alcohol, tobacco, and illicit drugs 3) The patient's current medications and supplements 4) Functional ability including ADL's, fall risk, home safety risk, hearing and visual impairment 5) Diet and physical activities 6) Evidence for depression or mood disorder 7) The patient's height, weight, and BMI have been recorded in the chart   I have made referrals, and provided counseling and education based on review of the above  Coronary artery disease involving native coronary artery of native heart with angina pectoris ([place]) He no longer has anginal symptoms since he started taking Ranexa.  Marland Kitchen He may be able to stop Brilintawhich he has bee taking since December .  continue lisinopril, metoprolol, and asa.   Proteinuria due to type 2 diabetes mellitus (HCC) Starting losartan.  stoppig amlodipine  Lab Results  Component Value Date   LABMICR 16.9 08/21/2020   MICROALBUR 46.3 (H) 10/22/2021   MICROALBUR 30 05/22/2020       Updated Medication List Outpatient Encounter Medications as of 10/21/2021  Medication Sig   albuterol (VENTOLIN HFA) 108 (90 Base) MCG/ACT inhaler Inhale 2 puffs into the lungs every 6 (six) hours as needed for wheezing or shortness of breath.   amLODipine (NORVASC) 10 MG tablet Take 10 mg by mouth daily.    aspirin 81 MG EC tablet Take 1 tablet (81 mg total) by mouth daily with breakfast. Swallow whole.   Budeson-Glycopyrrol-Formoterol (BREZTRI AEROSPHERE) 160-9-4.8 MCG/ACT AERO Inhale 2 puffs into the lungs 2 (two) times daily.   Budeson-Glycopyrrol-Formoterol (BREZTRI AEROSPHERE) 160-9-4.8 MCG/ACT AERO Inhale 2 puffs into the lungs in the morning and at bedtime.   glucose blood (ONETOUCH VERIO) test strip Test sugars twice daily   metFORMIN (GLUCOPHAGE) 850 MG tablet TAKE 1 TABLET(850 MG) BY MOUTH TWICE DAILY WITH A MEAL   metoprolol tartrate (LOPRESSOR) 25 MG tablet Take 1 tablet (25 mg total) by mouth 2 (two) times daily.   nitroGLYCERIN (NITROSTAT) 0.4 MG SL tablet Place 1 tablet (0.4 mg total) under the tongue every 5 (five) minutes as needed for chest pain.   ONETOUCH DELICA LANCETS 01V MISC Test sugars twice daily   ranolazine (RANEXA) 500 MG 12 hr tablet Take 500 mg by mouth 2 (two) times daily.   rosuvastatin (CRESTOR) 40 MG tablet Take 40 mg by mouth daily.   ticagrelor (BRILINTA) 90 MG TABS tablet Take 1 tablet (90 mg total) by mouth 2 (two) times daily.   vitamin B-12 (CYANOCOBALAMIN) 1000 MCG tablet Take 1,000 mcg by mouth daily.   [DISCONTINUED] HYDROcodone-acetaminophen (NORCO/VICODIN) 5-325 MG tablet  Take 1-2 tablets by mouth every 4 (four) hours as needed for moderate pain.   [DISCONTINUED] pantoprazole (PROTONIX) 40 MG tablet TAKE 1 TABLET(40 MG) BY MOUTH TWICE DAILY   [DISCONTINUED] Plecanatide (TRULANCE) 3 MG TABS Take 1 tablet by mouth daily.   pantoprazole (PROTONIX) 40 MG tablet Take 1 tablet (40 mg total) by mouth daily. TAKE 1 TABLET(40 MG) BY MOUTH TWICE DAILY   [DISCONTINUED] glipiZIDE (GLUCOTROL) 10 MG tablet Take 10 mg by mouth daily before breakfast. (Patient not taking: Reported on 10/21/2021)   [DISCONTINUED] magnesium hydroxide (MILK OF MAGNESIA) 400 MG/5ML suspension Take by mouth daily as needed for mild constipation. (Patient not taking: Reported on 10/21/2021)   No  facility-administered encounter medications on file as of 10/21/2021.

## 2021-10-21 NOTE — Patient Instructions (Signed)
PLEASE RETURN AT YOUR CONVENIENCE TO HAVE YOUR PSA CHECKED  YOUR COLONOSCOPY WILL BE DUE NEXT YEAR  You received the INFLUENZA AND PNEUMONIA VACCINES TODAY  PODIATRY REFERRAL HAS BEEN MADE

## 2021-10-21 NOTE — Assessment & Plan Note (Signed)

## 2021-10-21 NOTE — Assessment & Plan Note (Signed)
He prefers to continue PPI.  Reducing dose to once daily

## 2021-10-21 NOTE — Assessment & Plan Note (Signed)
He no longer has anginal symptoms since he started taking Ranexa.  Marland Kitchen He may be able to stop Brilintawhich he has bee taking since December .  continue lisinopril, metoprolol, and asa.

## 2021-10-22 LAB — MICROALBUMIN / CREATININE URINE RATIO
Creatinine,U: 194.5 mg/dL
Microalb Creat Ratio: 23.8 mg/g (ref 0.0–30.0)
Microalb, Ur: 46.3 mg/dL — ABNORMAL HIGH (ref 0.0–1.9)

## 2021-10-23 ENCOUNTER — Other Ambulatory Visit (INDEPENDENT_AMBULATORY_CARE_PROVIDER_SITE_OTHER): Payer: BC Managed Care – PPO

## 2021-10-23 DIAGNOSIS — Z125 Encounter for screening for malignant neoplasm of prostate: Secondary | ICD-10-CM

## 2021-10-23 DIAGNOSIS — R0600 Dyspnea, unspecified: Secondary | ICD-10-CM

## 2021-10-24 LAB — PSA: PSA: 0.45 ng/mL (ref 0.10–4.00)

## 2021-10-26 DIAGNOSIS — E1129 Type 2 diabetes mellitus with other diabetic kidney complication: Secondary | ICD-10-CM | POA: Insufficient documentation

## 2021-10-26 MED ORDER — TELMISARTAN 40 MG PO TABS: 40.0000 mg | ORAL_TABLET | Freq: Every day | ORAL | 1 refills | Status: DC

## 2021-10-26 NOTE — Addendum Note (Signed)
Addended by: [name] on: 10/26/2021 03:17 PM   Modules accepted: Orders

## 2021-10-26 NOTE — Assessment & Plan Note (Addendum)
Starting TELMISARTAN . STOPPING  Amlodipine.  BMET NEEDED ONE WEEK AFTER SWITCH  Lab Results  Component Value Date   LABMICR 16.9 08/21/2020   MICROALBUR 46.3 (H) 10/22/2021   MICROALBUR 30 05/22/2020

## 2021-10-29 ENCOUNTER — Ambulatory Visit: Payer: BC Managed Care – PPO | Admitting: Nurse Practitioner

## 2021-10-29 ENCOUNTER — Encounter: Payer: Self-pay | Admitting: Internal Medicine

## 2021-10-29 ENCOUNTER — Ambulatory Visit (INDEPENDENT_AMBULATORY_CARE_PROVIDER_SITE_OTHER): Payer: BC Managed Care – PPO | Admitting: Pulmonary Disease

## 2021-10-29 ENCOUNTER — Encounter: Payer: Self-pay | Admitting: Nurse Practitioner

## 2021-10-29 VITALS — BP 118/78 | HR 73 | Ht 72.5 in | Wt 191.6 lb

## 2021-10-29 DIAGNOSIS — J984 Other disorders of lung: Secondary | ICD-10-CM

## 2021-10-29 DIAGNOSIS — R0609 Other forms of dyspnea: Secondary | ICD-10-CM

## 2021-10-29 DIAGNOSIS — R0600 Dyspnea, unspecified: Secondary | ICD-10-CM

## 2021-10-29 DIAGNOSIS — R0689 Other abnormalities of breathing: Secondary | ICD-10-CM

## 2021-10-29 LAB — PULMONARY FUNCTION TEST
DL/VA % pred: 80 %
DL/VA: 3.5 ml/min/mmHg/L
DLCO cor % pred: 55 %
DLCO cor: 17.38 ml/min/mmHg
DLCO unc % pred: 55 %
DLCO unc: 17.38 ml/min/mmHg
FEF 25-75 Post: 3.7 L/sec
FEF 25-75 Pre: 3.26 L/sec
FEF2575-%Change-Post: 13 %
FEF2575-%Pred-Post: 103 %
FEF2575-%Pred-Pre: 91 %
FEV1-%Change-Post: 3 %
FEV1-%Pred-Post: 68 %
FEV1-%Pred-Pre: 66 %
FEV1-Post: 2.85 L
FEV1-Pre: 2.76 L
FEV1FVC-%Change-Post: 3 %
FEV1FVC-%Pred-Pre: 108 %
FEV6-%Change-Post: 0 %
FEV6-%Pred-Post: 63 %
FEV6-%Pred-Pre: 63 %
FEV6-Post: 3.28 L
FEV6-Pre: 3.3 L
FEV6FVC-%Pred-Post: 103 %
FEV6FVC-%Pred-Pre: 103 %
FVC-%Change-Post: 0 %
FVC-%Pred-Post: 60 %
FVC-%Pred-Pre: 60 %
FVC-Post: 3.28 L
FVC-Pre: 3.3 L
Post FEV1/FVC ratio: 87 %
Post FEV6/FVC ratio: 100 %
Pre FEV1/FVC ratio: 84 %
Pre FEV6/FVC Ratio: 100 %
RV % pred: 91 %
RV: 2.04 L
TLC % pred: 75 %
TLC: 5.65 L

## 2021-10-29 NOTE — Progress Notes (Signed)
PFT done today. 

## 2021-10-29 NOTE — Patient Instructions (Signed)
Continue Breztri 2 puffs twice a day.  Brush tongue and rinse mouth afterwards Continue Albuterol inhaler 2 puffs every 6 hours as needed for shortness of breath or wheezing. Notify if symptoms persist despite rescue inhaler/neb use.  High resolution CT chest ordered for further evaluation of your lungs -someone will contact you for scheduling  Follow up in 4 weeks to discuss CT scan results with Dr. Lake Bells or Alanson Aly. If symptoms do not improve or worsen, please contact office for sooner follow up or seek emergency care.

## 2021-10-29 NOTE — Progress Notes (Signed)
$'@Patient'g$  ID: Jacob Baker, male    DOB: 08/13/1970, 52 y.o.   MRN: [id number]  Chief Complaint  Patient presents with   Follow-up    Pt f/u after PFT, feels like it went well. No concerns regarding breathing     Referring provider: [name], MD  HPI: 51 year old male, former smoker (quit December 2022) followed for emphysema, chronic cough and DOE.  He is a patient of Dr. Anastasia Pall and last seen in office on 09/12/2021 for initial pulmonary consult.  Past medical history significant for hypertension, DM 2, CAD, hiatal hernia, GERD, HLD, anxiety.  TEST/EVENTS:  12/31/2020 echo: EF 65-70, mild LVH; RV size and function nl 01/07/2021 CTA chest: no evidence of PE. No LAD. There is mild dependent atelectasis in b/l lower lobes.  10/23/2021 PFTs: FVC 60, FEV1 66, ratio 87, TLC 75, DLCOcor 55  09/12/2021: OV with Dr. Lake Bells for initial pulmonary consult.  He has been experiencing DOE at rest and with exertion.  Can walk on level ground and keep up with others but just feels short of breath.  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  Feels like it was easier to breathe before he quit smoking.  Does feel like the concrete work makes his breathing and cough worse.  He has been around this for about the last 10 years and felt like he started struggle with his breathing around 5 years ago.  Was previously prescribed registry but never filled this.  No history of lung problems in his childhood or family history of lung problems.  Does have a dry cough its been going on for a few months.  Spirometry completed, which she had difficulty with but was suggestive of airflow obstruction.  Would be in keeping with diagnosis of COPD which makes sense considering his long cigarette exposure.  Worried about the possibility of environmental lung disease as well so plan to get chest x-ray and full PFT.  If this is suggestive of interstitial lung disease that he would need to have HRCT chest.  Recommended that  he trial Breztri to see if he receives any benefit from it.  10/29/2021: Today-follow-up Patient presents today for follow-up after undergoing pulmonary function testing which revealed a moderate restrictive defect.  His FVC was 60% and TLC was 75%.  He also had a moderately severe diffusion defect with DLCO 55%.  No formal diagnosis of obstruction with ratio of 84 and no significant bronchodilator response.  Today, he reports that he has been feeling better with using the Breztri.  States the last time he used it prior to his test was yesterday morning.  He does feel like his activity tolerance has improved some since being on this.  He does not get as short winded, especially with rest.  He does still occasionally have some cough, especially after working.  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  Denies any other environmental or occupational exposures.  He continues to remain smoke-free.  Denies any wheezing, hemoptysis, fevers, night sweats, weight loss, anorexia, lower extremity swelling, orthopnea.  Has not had to use the albuterol since being on the [place].   No Known Allergies  Immunization History  Administered Date(s) Administered   Influenza Split 12/15/2013   Influenza,inj,Quad PF,6+ Mos 10/26/2014, 10/14/2015, 10/13/2017, 10/18/2018, 10/20/2019, 10/21/2020, 10/21/2021   Influenza-Unspecified 09/26/2016   Moderna Sars-Covid-2 Vaccination  04/28/2019, 05/30/2019   PNEUMOCOCCAL CONJUGATE-20 10/21/2021   Pneumococcal Polysaccharide-23 10/14/2015   Tdap 10/26/2014    Past Medical History:  Diagnosis Date   (HFpEF) heart failure with preserved ejection fraction ([place])    a. 12/2020 Echo: EF 65-70%, no rwma, mild LVH, nl RV fxn, triv MR.   CAD (coronary artery disease)    a. 12/2020 NSTEMI/PCI: LM nl, LAD min irregs, D1 99 (2.0x12 Onyx Frontier DES), LCX 85p (small), RCA  80p/m (4.0x18 Onyx Frontier DES - staged following day), 60d, RPDA mod dzs. EF 50-55%.   DVT (deep venous thrombosis) ([place]) 04/2017   GERD (gastroesophageal reflux disease)    History of migraine    Hyperlipidemia    Hypertension    Long term current use of antithrombotics/antiplatelets    a.) on daily DAPT therapy (ticagrelor + ASA)   Non-STEMI (non-ST elevated myocardial infarction) ([place]) 12/30/2020   Syncope and collapse    a.) single episode; related to dehydration   T2DM (type 2 diabetes mellitus) ([place])     Tobacco History: Social History   Tobacco Use  Smoking Status Former   Packs/day: 2.00   Years: 27.00   Total pack years: 54.00   Types: Cigarettes   Quit date: 12/30/2020   Years since quitting: 0.8   Passive exposure: Never  Smokeless Tobacco Never   Counseling given: Not Answered   Outpatient Medications Prior to Visit  Medication Sig Dispense Refill   albuterol (VENTOLIN HFA) 108 (90 Base) MCG/ACT inhaler Inhale 2 puffs into the lungs every 6 (six) hours as needed for wheezing or shortness of breath. 8 g 1   aspirin 81 MG EC tablet Take 1 tablet (81 mg total) by mouth daily with breakfast. Swallow whole. 90 tablet 3   Budeson-Glycopyrrol-Formoterol (BREZTRI AEROSPHERE) 160-9-4.8 MCG/ACT AERO Inhale 2 puffs into the lungs 2 (two) times daily. 10.7 g 11   Budeson-Glycopyrrol-Formoterol (BREZTRI AEROSPHERE) 160-9-4.8 MCG/ACT AERO Inhale 2 puffs into the lungs in the morning and at bedtime. 23.6 g 0   glucose blood (ONETOUCH VERIO) test strip Test sugars twice daily 50 each 6   metFORMIN (GLUCOPHAGE) 850 MG tablet TAKE 1 TABLET(850 MG) BY MOUTH TWICE DAILY WITH A MEAL 180 tablet 1   metoprolol tartrate (LOPRESSOR) 25 MG tablet Take 1 tablet (25 mg total) by mouth 2 (two) times daily. 60 tablet 11   nitroGLYCERIN (NITROSTAT) 0.4 MG SL tablet Place 1 tablet (0.4 mg total) under the tongue every 5 (five) minutes as needed for chest pain. 25 tablet 1   ONETOUCH DELICA  LANCETS 70Y MISC Test sugars twice daily 50 each 3   pantoprazole (PROTONIX) 40 MG tablet Take 1 tablet (40 mg total) by mouth daily. TAKE 1 TABLET(40 MG) BY MOUTH TWICE DAILY 90 tablet 3   ranolazine (RANEXA) 500 MG 12 hr tablet Take 500 mg by mouth 2 (two) times daily.     rosuvastatin (CRESTOR) 40 MG tablet Take 40 mg by mouth daily.     telmisartan (MICARDIS) 40 MG tablet Take 1 tablet (40 mg total) by mouth at bedtime. 90 tablet 1   ticagrelor (BRILINTA) 90 MG TABS tablet Take 1 tablet (90 mg total) by mouth 2 (two) times daily. 60 tablet 11   vitamin B-12 (CYANOCOBALAMIN) 1000 MCG tablet Take 1,000 mcg by mouth daily.     No facility-administered medications prior to visit.     Review of Systems:   Constitutional: No weight loss or gain, night sweats, fevers, chills, fatigue, or lassitude. HEENT: No  headaches, difficulty swallowing, tooth/dental problems, or sore throat. No sneezing, itching, ear ache, nasal congestion, or post nasal drip CV:  No chest pain, orthopnea, PND, swelling in lower extremities, anasarca, dizziness, palpitations, syncope Resp: +shortness of breath with exertion (significant improvement); occasional cough. No excess mucus or change in color of mucus. No hemoptysis. No wheezing.  No chest wall deformity GI:  No heartburn, indigestion, abdominal pain, nausea, vomiting, diarrhea, change in bowel habits, loss of appetite, bloody stools.  Skin: No rash, lesions, ulcerations MSK:  No joint pain or swelling.  No decreased range of motion.  No back pain. Psych: No depression or anxiety. Mood stable.     Physical Exam:  BP 118/78   Pulse 73   Ht 6' 0.5" (1.842 m)   Wt 191 lb 9.6 oz (86.9 kg)   SpO2 98%   BMI 25.63 kg/m   GEN: Pleasant, interactive, well-appearing; in no acute distress. HEENT:  Normocephalic and atraumatic. PERRLA. Sclera white. Nasal turbinates pink, moist and patent bilaterally. No rhinorrhea present. Oropharynx pink and moist, without  exudate or edema. No lesions, ulcerations, or postnasal drip.  NECK:  Supple w/ fair ROM. No JVD present. No lymphadenopathy.   CV: RRR, no m/r/g, no peripheral edema. Pulses intact, +2 bilaterally. No cyanosis, pallor or clubbing. PULMONARY:  Unlabored, regular breathing. Clear bilaterally A&P w/o wheezes/rales/rhonchi. No accessory muscle use.  GI: BS present and normoactive. Soft, non-tender to palpation. No organomegaly or masses detected.  MSK: No erythema, warmth or tenderness. Cap refil <2 sec all extrem. No deformities or joint swelling noted.  Neuro: A/Ox3. No focal deficits noted.   Skin: Warm, no lesions or rashe Psych: Normal affect and behavior. Judgement and thought content appropriate.     Lab Results:  CBC    Component Value Date/Time   WBC 6.5 08/18/2021 1613   RBC 3.72 (L) 08/18/2021 1613   HGB 10.6 (L) 08/18/2021 1613   HGB 13.6 10/04/2013 2025   HCT 30.2 (L) 08/18/2021 1613   HCT 39.4 (L) 10/04/2013 2025   PLT 250.0 08/18/2021 1613   PLT 234 10/04/2013 2025   MCV 81.2 08/18/2021 1613   MCV 84 10/04/2013 2025   MCH 27.6 06/30/2021 0857   MCHC 35.2 08/18/2021 1613   RDW 15.5 08/18/2021 1613   RDW 14.1 10/04/2013 2025   LYMPHSABS 1.3 08/18/2021 1613   LYMPHSABS 1.2 10/04/2013 2025   MONOABS 0.5 08/18/2021 1613   MONOABS 0.6 10/04/2013 2025   EOSABS 0.1 08/18/2021 1613   EOSABS 0.2 10/04/2013 2025   BASOSABS 0.1 08/18/2021 1613   BASOSABS 0.0 10/04/2013 2025    BMET    Component Value Date/Time   NA 140 08/18/2021 1613   NA 142 08/21/2020 0824   NA 138 10/04/2013 2025   K 3.8 08/18/2021 1613   K 3.0 (L) 10/04/2013 2025   CL 104 08/18/2021 1613   CL 104 10/04/2013 2025   CO2 22 08/18/2021 1613   CO2 25 10/04/2013 2025   GLUCOSE 117 (H) 08/18/2021 1613   GLUCOSE 175 (H) 10/04/2013 2025   BUN 12 08/18/2021 1613   BUN 12 08/21/2020 0824   BUN 13 10/04/2013 2025   CREATININE 1.17 08/18/2021 1613   CREATININE 1.04 11/16/2019 1505   CALCIUM 9.8  08/18/2021 1613   CALCIUM 9.2 10/04/2013 2025   GFRNONAA >60 06/30/2021 0857   GFRNONAA 83 11/16/2019 1505   GFRAA 96 11/16/2019 1505    BNP    Component Value Date/Time   BNP 66.0 01/07/2021  1142     Imaging:  No results found.       Latest Ref Rng & Units 10/23/2021    2:01 PM  PFT Results  FVC-Pre L 3.30   FVC-Predicted Pre % 60   FVC-Post L 3.28   FVC-Predicted Post % 60   Pre FEV1/FVC % % 84   Post FEV1/FCV % % 87   FEV1-Pre L 2.76   FEV1-Predicted Pre % 66   FEV1-Post L 2.85   DLCO uncorrected ml/min/mmHg 17.38   DLCO UNC% % 55   DLCO corrected ml/min/mmHg 17.38   DLCO COR %Predicted % 55   DLVA Predicted % 80   TLC L 5.65   TLC % Predicted % 75   RV % Predicted % 91     No results found for: "NITRICOXIDE"      Assessment & Plan:   Restrictive lung disease He has a restrictive defect on PFTs today.  He does have some previous and current occupational exposures to dust/fumes as well as birds. Concern for possible underlying ILD given his history and testing. HRCT ordered for further evaluation.  No formal diagnosis of obstructive defect; he did have some borderline midflow reversibility but otherwise, no significant BD. He has had good response to [place] so we will keep him on this for now.   Patient Instructions  Continue Breztri 2 puffs twice a day.  Brush tongue and rinse mouth afterwards Continue Albuterol inhaler 2 puffs every 6 hours as needed for shortness of breath or wheezing. Notify if symptoms persist despite rescue inhaler/neb use.  High resolution CT chest ordered for further evaluation of your lungs -someone will contact you for scheduling  Follow up in 4 weeks to discuss CT scan results with Dr. Lake Bells or Alanson Aly. If symptoms do not improve or worsen, please contact office for sooner follow up or seek emergency care.      DOE (dyspnea on exertion) See above plan.  I spent 35 minutes of dedicated to the care of this  patient on the date of this encounter to include pre-visit review of records, face-to-face time with the patient discussing conditions above, post visit ordering of testing, clinical documentation with the electronic health record, making appropriate referrals as documented, and communicating necessary findings to members of the patients care team.  Clayton Bibles, NP 10/31/2021  Pt aware and understands NP's role.

## 2021-10-29 NOTE — Assessment & Plan Note (Addendum)
He has a restrictive defect on PFTs today.  He does have some previous and current occupational exposures to dust/fumes as well as birds. Concern for possible underlying ILD given his history and testing. HRCT ordered for further evaluation.  No formal diagnosis of obstructive defect; he did have some borderline midflow reversibility but otherwise, no significant BD. He has had good response to [place] so we will keep him on this for now.   Patient Instructions  Continue Breztri 2 puffs twice a day.  Brush tongue and rinse mouth afterwards Continue Albuterol inhaler 2 puffs every 6 hours as needed for shortness of breath or wheezing. Notify if symptoms persist despite rescue inhaler/neb use.  High resolution CT chest ordered for further evaluation of your lungs -someone will contact you for scheduling  Follow up in 4 weeks to discuss CT scan results with Dr. Lake Bells or Alanson Aly. If symptoms do not improve or worsen, please contact office for sooner follow up or seek emergency care.

## 2021-10-30 NOTE — Assessment & Plan Note (Signed)
SENT FOR PULMONARY EVALUATION WITH PFT'S DONE OCT 2023 :  There is mild restrictive lung disease with a disproportionate decrease in the patient's diffusion capacity. This can be seen in pulmonary parenchymal disease or pulmonary vascular disease. Correlation with chest imaging or echocardiogram is recommended.

## 2021-10-31 ENCOUNTER — Encounter: Payer: Self-pay | Admitting: Nurse Practitioner

## 2021-10-31 NOTE — Progress Notes (Signed)
Reviewed, agree 

## 2021-10-31 NOTE — Assessment & Plan Note (Signed)
See above plan. 

## 2021-11-05 ENCOUNTER — Ambulatory Visit (HOSPITAL_COMMUNITY)
Admission: RE | Admit: 2021-11-05 | Discharge: 2021-11-05 | Disposition: A | Discharge status: Home / Self Care | Payer: BC Managed Care – PPO | Source: Ambulatory Visit | Attending: Nurse Practitioner | Admitting: Nurse Practitioner

## 2021-11-05 DIAGNOSIS — J984 Other disorders of lung: Secondary | ICD-10-CM | POA: Insufficient documentation

## 2021-11-05 DIAGNOSIS — R911 Solitary pulmonary nodule: Secondary | ICD-10-CM | POA: Diagnosis not present

## 2021-11-05 DIAGNOSIS — I700 Atherosclerosis of aorta: Secondary | ICD-10-CM | POA: Diagnosis not present

## 2021-11-05 DIAGNOSIS — I2510 Atherosclerotic heart disease of native coronary artery without angina pectoris: Secondary | ICD-10-CM | POA: Diagnosis not present

## 2021-11-07 ENCOUNTER — Other Ambulatory Visit: Payer: Self-pay

## 2021-11-07 DIAGNOSIS — Z87891 Personal history of nicotine dependence: Secondary | ICD-10-CM

## 2021-11-07 DIAGNOSIS — J984 Other disorders of lung: Secondary | ICD-10-CM

## 2021-11-07 NOTE — Progress Notes (Signed)
Please notify patient no evidence of ILD on imaging, which is good news. He did have some air trapping, which can be seen with COPD/asthma. Continue Breztri as previously discussed. He also had a very small nodule in the RUL, which has been stable and considered benign; no need for specific follow up. I do recommend we send him to the lung cancer screening program with his smoking history. Thanks!

## 2021-11-18 ENCOUNTER — Encounter: Payer: Self-pay | Admitting: Internal Medicine

## 2021-11-18 ENCOUNTER — Telehealth: Payer: Self-pay

## 2021-11-18 NOTE — Telephone Encounter (Signed)
Error

## 2021-11-21 ENCOUNTER — Ambulatory Visit: Payer: BC Managed Care – PPO | Attending: Student | Admitting: Student

## 2021-11-21 ENCOUNTER — Encounter: Payer: Self-pay | Admitting: Student

## 2021-11-21 VITALS — BP 138/68 | HR 78 | Ht 73.0 in | Wt 198.0 lb

## 2021-11-21 DIAGNOSIS — J984 Other disorders of lung: Secondary | ICD-10-CM

## 2021-11-21 DIAGNOSIS — I25118 Atherosclerotic heart disease of native coronary artery with other forms of angina pectoris: Secondary | ICD-10-CM

## 2021-11-21 DIAGNOSIS — I10 Essential (primary) hypertension: Secondary | ICD-10-CM

## 2021-11-21 DIAGNOSIS — E785 Hyperlipidemia, unspecified: Secondary | ICD-10-CM | POA: Diagnosis not present

## 2021-11-21 DIAGNOSIS — E119 Type 2 diabetes mellitus without complications: Secondary | ICD-10-CM | POA: Diagnosis not present

## 2021-11-21 MED ORDER — RANOLAZINE ER 1000 MG PO TB12: 1000.0000 mg | ORAL_TABLET | Freq: Two times a day (BID) | ORAL | 11 refills | Status: DC

## 2021-11-21 NOTE — Progress Notes (Signed)
Cardiology Office Note    Date:  11/21/2021   ID:  Jacob Baker, DOB [birth year], MRN [id number]  PCP:  [name], MD  Cardiologist: [name], MD    Chief Complaint  Patient presents with   Follow-up    6 month visit    History of Present Illness:    Jacob Baker is a 51 y.o. male with past medical history of CAD (s/p NSTEMI in 12/2020 with DES to D1 and staged DES to RCA), HTN, HLD, Type 2 DM, history of DVT and prior tobacco use who presents to the office today for 14-monthfollow-up.   He was last examined by Dr. [name] 04/2021 and reported easy bruising with Brilinta and some minor nosebleeds but denied any significant bleeding issues. He was still experiencing some intermittent chest discomfort but had been intolerant to Imdur secondary to a headache. Therefore, he was started on Ranexa 500 mg twice daily. He was planning to undergo cholecystectomy and it was recommended to wait 6 months if possible from recent intervention and could hold Brilinta for 5 days prior to his procedure.  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  In talking with the patient and his wife today, he reports still having intermittent episodes of chest discomfort which have been occurring since his prior MI in 12/2020 but says these have decreased in frequency and severity. They can occur at rest or with activity. He does have baseline dyspnea on exertion and is being followed by pulmonology for COPD. He has been prescribed inhalers but does not utilize these routinely. He denies any specific orthopnea or PND. 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 He has been wearing compression stockings intermittently at night and I encouraged him to utilize these during the day and elevate his legs in the  evening/nighttime hours.  Past Medical History:  Diagnosis Date   (HFpEF) heart failure with preserved ejection fraction ([facility]    a. 12/2020 Echo: EF 65-70%, no rwma, mild LVH, nl RV fxn, triv MR.   CAD (coronary artery disease)    a. 12/2020 NSTEMI/PCI: LM nl, LAD min irregs, D1 99 (2.0x12 [place] Frontier DES), LCX 85p (small), RCA 80p/m (4.0x18 Onyx Frontier DES - staged following day), 60d, RPDA mod dzs. EF 50-55%.   DVT (deep venous thrombosis) ([facility] 04/2017   GERD (gastroesophageal reflux disease)    History of migraine    Hyperlipidemia    Hypertension    Long term current use of antithrombotics/antiplatelets    a.) on daily DAPT therapy (ticagrelor + ASA)   Non-STEMI (non-ST elevated myocardial infarction) ([name] 12/30/2020   Syncope and collapse    a.) single episode; related to dehydration   T2DM (type 2 diabetes mellitus) ([name]     Past Surgical History:  Procedure Laterality Date   CHOLECYSTECTOMY     COLONOSCOPY     COLONOSCOPY WITH PROPOFOL N/A 08/06/2017   Procedure: COLONOSCOPY WITH PROPOFOL;  Surgeon: [name] MD;  [place]  Service: Endoscopy;  Laterality: N/A;  Diabetic - oral meds   CORONARY STENT INTERVENTION N/A 12/31/2020   Procedure: CORONARY STENT INTERVENTION;  Surgeon: [name] MD;  [facility];  Service: Cardiovascular;  Laterality: N/A;   CORONARY STENT INTERVENTION N/A 01/01/2021  Procedure: CORONARY STENT INTERVENTION;  Surgeon: [name], MD;  Location: [place] [facility];  Service: Cardiovascular;  Laterality: N/A;   ESOPHAGEAL DILATION  09/24/2016   Procedure: ESOPHAGEAL DILATION;  Surgeon: Lucilla Lame, MD;  Location: [place];  Service: Gastroenterology;;   ESOPHAGOGASTRODUODENOSCOPY (EGD) WITH PROPOFOL N/A 04/08/2015   Procedure: ESOPHAGOGASTRODUODENOSCOPY (EGD) WITH PROPOFOL with dialation;  Surgeon: Lucilla Lame, MD;  Location: [facility];  Service: Endoscopy;   Laterality: N/A;  diabetic - oral meds   ESOPHAGOGASTRODUODENOSCOPY (EGD) WITH PROPOFOL N/A 09/24/2016   Procedure: ESOPHAGOGASTRODUODENOSCOPY (EGD) WITH PROPOFOL WITH DILATION;  Surgeon: Lucilla Lame, MD;  Location: [place];  Service: Gastroenterology;  Laterality: N/A;  Diabetic - oral meds   FINGER AMPUTATION  01/27/2007   partial removal left index finger    LEFT HEART CATH AND CORONARY ANGIOGRAPHY N/A 12/31/2020   Procedure: LEFT HEART CATH AND CORONARY ANGIOGRAPHY;  Surgeon: Belva Crome, MD;  Location: [place];  Service: Cardiovascular;  Laterality: N/A;   LEFT HEART CATH AND CORONARY ANGIOGRAPHY N/A 01/01/2021   Procedure: LEFT HEART CATH AND CORONARY ANGIOGRAPHY;  Surgeon: [name], MD;  Location: [place] [facility];  Service: Cardiovascular;  Laterality: N/A;   POLYPECTOMY  08/06/2017   Procedure: POLYPECTOMY INTESTINAL;  Surgeon: Lucilla Lame, MD;  Location: [facility];  Service: Endoscopy;;    Current Medications: Outpatient Medications Prior to Visit  Medication Sig Dispense Refill   albuterol (VENTOLIN HFA) 108 (90 Base) MCG/ACT inhaler Inhale 2 puffs into the lungs every 6 (six) hours as needed for wheezing or shortness of breath. 8 g 1   amLODipine (NORVASC) 10 MG tablet Take 10 mg by mouth daily.     aspirin 81 MG EC tablet Take 1 tablet (81 mg total) by mouth daily with breakfast. Swallow whole. 90 tablet 3   Budeson-Glycopyrrol-Formoterol (BREZTRI AEROSPHERE) 160-9-4.8 MCG/ACT AERO Inhale 2 puffs into the lungs 2 (two) times daily. 10.7 g 11   Budeson-Glycopyrrol-Formoterol (BREZTRI AEROSPHERE) 160-9-4.8 MCG/ACT AERO Inhale 2 puffs into the lungs in the morning and at bedtime. 23.6 g 0   glucose blood (ONETOUCH VERIO) test strip Test sugars twice daily 50 each 6   metFORMIN (GLUCOPHAGE) 850 MG tablet TAKE 1 TABLET(850 MG) BY MOUTH TWICE DAILY WITH A MEAL 180 tablet 1   metoprolol tartrate (LOPRESSOR) 25 MG tablet Take 1 tablet  (25 mg total) by mouth 2 (two) times daily. 60 tablet 11   nitroGLYCERIN (NITROSTAT) 0.4 MG SL tablet Place 1 tablet (0.4 mg total) under the tongue every 5 (five) minutes as needed for chest pain. 25 tablet 1   ONETOUCH DELICA LANCETS 15V MISC Test sugars twice daily 50 each 3   pantoprazole (PROTONIX) 40 MG tablet Take 1 tablet (40 mg total) by mouth daily. TAKE 1 TABLET(40 MG) BY MOUTH TWICE DAILY 90 tablet 3   rosuvastatin (CRESTOR) 40 MG tablet Take 40 mg by mouth daily.     ticagrelor (BRILINTA) 90 MG TABS tablet Take 1 tablet (90 mg total) by mouth 2 (two) times daily. 60 tablet 11   vitamin B-12 (CYANOCOBALAMIN) 1000 MCG tablet Take 1,000 mcg by mouth daily.     ranolazine (RANEXA) 500 MG 12 hr tablet Take 500 mg by mouth 2 (two) times daily.     telmisartan (MICARDIS) 40 MG tablet Take 1 tablet (40 mg total) by mouth at bedtime. (Patient not taking: Reported on 11/21/2021) 90 tablet 1   No facility-administered medications prior to visit.  Allergies:   Patient has no known allergies.   Social History   Socioeconomic History   Marital status: Married    Spouse name: Larene Beach   Number of children: 4   Years of education: Not on file   Highest education level: Not on file  Occupational History   Not on file  Tobacco Use   Smoking status: Former    Packs/day: 2.00    Years: 27.00    Total pack years: 54.00    Types: Cigarettes    Quit date: 12/30/2020    Years since quitting: 0.8    Passive exposure: Never   Smokeless tobacco: Never  Vaping Use   Vaping Use: Never used  Substance and Sexual Activity   Alcohol use: No   Drug use: No   Sexual activity: Not on file  Other Topics Concern   Not on file  Social History Narrative   Not on file   Social Determinants of Health   Financial Resource Strain: Not on file  Food Insecurity: Not on file  Transportation Needs: Not on file  Physical Activity: Not on file  Stress: Not on file  Social Connections: Not on file      Family History:  The patient's family history includes Diabetes in his father; Heart disease in his father; Hypertension in his brother, brother, father, mother, and sister.   Review of Systems:    Please see the history of present illness.     All other systems reviewed and are otherwise negative except as noted above.   Physical Exam:    VS:  BP 138/68   Pulse 78   Ht '6\' 1"'$  (1.854 m)   Wt 198 lb (89.8 kg)   SpO2 98%   BMI 26.12 kg/m    General: Well developed, well nourished,male appearing in no acute distress. Head: Normocephalic, atraumatic. Neck: No carotid bruits. JVD not elevated.  Lungs: Respirations regular and unlabored, without wheezes or rales.  Heart: Regular rate and rhythm. No S3 or S4.  No murmur, no rubs, or gallops appreciated. Abdomen: Appears non-distended. No obvious abdominal masses. Msk:  Strength and tone appear normal for age. No obvious joint deformities or effusions. Extremities: No clubbing or cyanosis.  Trace lower extremity edema.  Distal pedal pulses are 2+ bilaterally. Neuro: Alert and oriented X 3. Moves all extremities spontaneously. No focal deficits noted. Psych:  Responds to questions appropriately with a normal affect. Skin: No rashes or lesions noted  Wt Readings from Last 3 Encounters:  11/21/21 198 lb (89.8 kg)  10/29/21 191 lb 9.6 oz (86.9 kg)  10/21/21 190 lb 3.2 oz (86.3 kg)     Studies/Labs Reviewed:   EKG:  EKG is not ordered today.    Recent Labs: 12/31/2020: Magnesium 2.1 01/07/2021: B Natriuretic Peptide 66.0 08/18/2021: ALT 15; BUN 12; Creatinine, Ser 1.17; Hemoglobin 10.6; Platelets 250.0; Potassium 3.8; Sodium 140; TSH 1.49   Lipid Panel    Component Value Date/Time   CHOL 106 08/18/2021 1613   CHOL 192 08/21/2020 0824   CHOL 180 03/23/2013 0422   TRIG 254 (H) 08/18/2021 1613   TRIG 218 (H) 03/23/2013 0422   HDL 30 (L) 08/18/2021 1613   HDL 28 (L) 08/21/2020 0824   HDL 21 (L) 03/23/2013 0422   CHOLHDL 3.5  08/18/2021 1613   VLDL 31 03/28/2021 0852   VLDL 44 (H) 03/23/2013 0422   LDLCALC 46 08/18/2021 1613   LDLCALC 115 (H) 03/23/2013 0422   LDLDIRECT 176.0 10/18/2018 1406  Additional studies/ records that were reviewed today include:   LHC: 12/2020 CONCLUSIONS: High-grade stenosis in the large mid diagonal branch 99% reduced to 0% with TIMI-3 flow using a 2.0 x 12 Onyx frontier.  Chest discomfort was dissimilar to presenting complaint. Left main is normal LAD has mild diffuse disease Circumflex contains 80 to 90% proximal stenosis.  The vessel is diffusely diseased and gives origin to 2-2 small obtuse marginal branches.  If symptoms continue, perhaps this proximal lesion should be stented. RCA has a "rosary bead "appearance.  It is a large vessel.  There is 60% mid, 60% distal, diffuse disease in the PDA and LV branches. Left ventriculography demonstrated mid anterior wall hypokinesis.  EF 50%.  LVEDP is normal.   RECOMMENDATIONS:   Aggressive risk factor modification. Smoking cessation. Monitor for recurrent symptoms and consider PCI of circumflex if symptoms do not abate with PCI of this moderate-sized diagonal branch    Coronary Stent Intervention: 12/2020 Prox Cx lesion is 85% stenosed.  This was a small vessel that was diffusely diseased.   Prox RCA to Mid RCA lesion is 80% stenosed.   A drug-eluting stent was successfully placed using a STENT ONYX FRONTIER 4.0X18, posdilated to 4.5 mm.   Post intervention, there is a 0% residual stenosis.   Dist RCA lesion is 50% stenosed.   Diagonal stent was widely patent.   LV end diastolic pressure is normal.   There is no aortic valve stenosis.   Successful PCI of RCA.  He needs aggressive secondary prevention, smoking cessation.  Would consider changing Plavix to Brilinta since he had ACS.   NST: 07/2021   The study is normal. The study is low risk.   No ST deviation was noted.   LV perfusion is normal.   Left ventricular  function is normal. Nuclear stress EF: 63 %. The left ventricular ejection fraction is normal (55-65%). End diastolic cavity size is normal.  Assessment:    1. Coronary artery disease of native artery of native heart with stable angina pectoris ([place])   2. Essential hypertension   3. Hyperlipidemia LDL goal <70   4. Type 2 diabetes mellitus without complication, without long-term current use of insulin ([place])   5. Restrictive lung disease      Plan:   In order of problems listed above:  1. CAD - He is s/p NSTEMI in 12/2020 with DES to D1 and staged DES to RCA. Recent NST in 07/2021 was low-risk as outlined above.  - He does experience intermittent episodes of chest discomfort which typically last for a few minutes and spontaneously resolve and have been occurring since 12/2020. We reviewed his catheterization report in detail today and that he does have a residual 85% LCx stenosis which was along a small vessel and medical management was recommended of this. - He has been taking Ranexa 500 mg twice daily and will plan to titrate to 1000 mg twice daily to see if this helps with his symptoms. He was previously intolerant to Imdur. Continue ASA 81 mg daily, Brilinta 90 mg twice daily, Crestor 40 mg daily and Lopressor 25 mg twice daily. Reviewed with Dr. Harl Bowie and he can stop Brilinta after 01/01/2022.  2. HTN - His blood pressure is at 138/68 during today's visit. Continue current medical therapy with Amlodipine 10 mg daily and Lopressor 25 mg twice daily.  3. HLD - FLP in 07/2021 showed total cholesterol 106, HDL 30, triglycerides 254 and LDL 46. LFT's WNL. Continue current medical  therapy with Crestor 40 mg daily.  4. Type 2 DM - Followed by Endocrinology. Hgb A1c was at 5.7 in 06/2021.  5. Restrictive Lung Disease - Followed by [facility] Pulmonology. He has not been using his inhalers regularly and I did recommend that he utilize [place] twice daily as prescribed and only use Albuterol as  needed. I encouraged him to reach out to Pulmonology if this does not help with his symptoms.   Medication Adjustments/Labs and Tests Ordered: Current medicines are reviewed at length with the patient today.  Concerns regarding medicines are outlined above.  Medication changes, Labs and Tests ordered today are listed in the Patient Instructions below. Patient Instructions  Medication Instructions:  Your physician has recommended you make the following change in your medication:   Increase Ranexa to 1000 mg Two Times Daily   You may stop Brilinta on 01/01/22  *If you need a refill on your cardiac medications before your next appointment, please call your pharmacy*   Lab Work: NONE   If you have labs (blood work) drawn today and your tests are completely normal, you will receive your results only by: [place] (if you have MyChart) OR A paper copy in the mail If you have any lab test that is abnormal or we need to change your treatment, we will call you to review the results.   Testing/Procedures: NONE    Follow-Up: At [facility], you and your health needs are our priority.  As part of our continuing mission to provide you with exceptional heart care, we have created designated Provider Care Teams.  These Care Teams include your primary Cardiologist (physician) and Advanced Practice Providers (APPs -  Physician Assistants and Nurse Practitioners) who all work together to provide you with the care you need, when you need it.  We recommend signing up for the patient portal called "MyChart".  Sign up information is provided on this After Visit Summary.  MyChart is used to connect with patients for Virtual Visits (Telemedicine).  Patients are able to view lab/test results, encounter notes, upcoming appointments, etc.  Non-urgent messages can be sent to your provider as well.   To learn more about what you can do with MyChart, go to [email or web address].    Your next  appointment:   6 month(s)  The format for your next appointment:   In Person  Provider:   [name], MD    Other Instructions Thank you for choosing [facility]!    Important Information About Sugar         Signed, Erma Heritage, PA-C  11/21/2021 5:07 PM    [place]. [address] [place], [place] [address] Phone: [phone] Fax: [phone]

## 2021-11-21 NOTE — Patient Instructions (Signed)
Medication Instructions:  Your physician has recommended you make the following change in your medication:   Increase Ranexa to 1000 mg Two Times Daily   You may stop Brilinta on 01/01/22  *If you need a refill on your cardiac medications before your next appointment, please call your pharmacy*   Lab Work: NONE   If you have labs (blood work) drawn today and your tests are completely normal, you will receive your results only by: [place] (if you have MyChart) OR A paper copy in the mail If you have any lab test that is abnormal or we need to change your treatment, we will call you to review the results.   Testing/Procedures: NONE    Follow-Up: At [facility], you and your health needs are our priority.  As part of our continuing mission to provide you with exceptional heart care, we have created designated Provider Care Teams.  These Care Teams include your primary Cardiologist (physician) and Advanced Practice Providers (APPs -  Physician Assistants and Nurse Practitioners) who all work together to provide you with the care you need, when you need it.  We recommend signing up for the patient portal called "MyChart".  Sign up information is provided on this After Visit Summary.  MyChart is used to connect with patients for Virtual Visits (Telemedicine).  Patients are able to view lab/test results, encounter notes, upcoming appointments, etc.  Non-urgent messages can be sent to your provider as well.   To learn more about what you can do with MyChart, go to [email or web address].    Your next appointment:   6 month(s)  The format for your next appointment:   In Person  Provider:   [name], MD    Other Instructions Thank you for choosing [facility]!    Important Information About Sugar

## 2021-11-25 ENCOUNTER — Other Ambulatory Visit: Payer: Self-pay

## 2021-11-25 MED ORDER — AMLODIPINE BESYLATE 10 MG PO TABS: 10.0000 mg | ORAL_TABLET | Freq: Every day | ORAL | 1 refills | Status: DC

## 2021-12-09 ENCOUNTER — Other Ambulatory Visit: Payer: Self-pay | Admitting: Student

## 2021-12-09 MED ORDER — RANOLAZINE ER 500 MG PO TB12: 500.0000 mg | ORAL_TABLET | Freq: Two times a day (BID) | ORAL | 3 refills | Status: DC

## 2022-01-11 ENCOUNTER — Other Ambulatory Visit: Payer: Self-pay | Admitting: Cardiology

## 2022-02-17 ENCOUNTER — Other Ambulatory Visit: Payer: Self-pay | Admitting: "Endocrinology

## 2022-02-17 DIAGNOSIS — E1165 Type 2 diabetes mellitus with hyperglycemia: Secondary | ICD-10-CM

## 2022-02-19 ENCOUNTER — Other Ambulatory Visit: Payer: Self-pay

## 2022-02-19 MED ORDER — TICAGRELOR 90 MG PO TABS: 90.0000 mg | ORAL_TABLET | Freq: Two times a day (BID) | ORAL | 11 refills | Status: DC

## 2022-02-20 NOTE — Telephone Encounter (Signed)
Addressed by [employer] D[employer] PA-C covering [place] 1/25, will remove from inbox.

## 2022-03-05 DIAGNOSIS — E1165 Type 2 diabetes mellitus with hyperglycemia: Secondary | ICD-10-CM | POA: Diagnosis not present

## 2022-03-06 LAB — COMPREHENSIVE METABOLIC PANEL
ALT: 17 IU/L (ref 0–44)
AST: 11 IU/L (ref 0–40)
Albumin/Globulin Ratio: 2.8 — ABNORMAL HIGH (ref 1.2–2.2)
Albumin: 4.8 g/dL (ref 3.8–4.9)
Alkaline Phosphatase: 97 IU/L (ref 44–121)
BUN/Creatinine Ratio: 10 (ref 9–20)
BUN: 11 mg/dL (ref 6–24)
Bilirubin Total: 0.8 mg/dL (ref 0.0–1.2)
CO2: 22 mmol/L (ref 20–29)
Calcium: 9.5 mg/dL (ref 8.7–10.2)
Chloride: 102 mmol/L (ref 96–106)
Creatinine, Ser: 1.05 mg/dL (ref 0.76–1.27)
Globulin, Total: 1.7 g/dL (ref 1.5–4.5)
Glucose: 198 mg/dL — ABNORMAL HIGH (ref 70–99)
Potassium: 4.4 mmol/L (ref 3.5–5.2)
Sodium: 140 mmol/L (ref 134–144)
Total Protein: 6.5 g/dL (ref 6.0–8.5)
eGFR: 85 mL/min/{1.73_m2} (ref >59)

## 2022-03-06 LAB — LIPID PANEL
Chol/HDL Ratio: 3.7 ratio (ref 0.0–5.0)
Cholesterol, Total: 118 mg/dL (ref 100–199)
HDL: 32 mg/dL — ABNORMAL LOW (ref >39)
LDL Chol Calc (NIH): 54 mg/dL (ref 0–99)
Triglycerides: 195 mg/dL — ABNORMAL HIGH (ref 0–149)
VLDL Cholesterol Cal: 32 mg/dL (ref 5–40)

## 2022-03-09 ENCOUNTER — Ambulatory Visit: Payer: BC Managed Care – PPO | Admitting: "Endocrinology

## 2022-03-09 ENCOUNTER — Encounter: Payer: Self-pay | Admitting: "Endocrinology

## 2022-03-09 VITALS — BP 120/76 | HR 72 | Ht 73.0 in | Wt 193.4 lb

## 2022-03-09 DIAGNOSIS — E1159 Type 2 diabetes mellitus with other circulatory complications: Secondary | ICD-10-CM

## 2022-03-09 DIAGNOSIS — I10 Essential (primary) hypertension: Secondary | ICD-10-CM

## 2022-03-09 DIAGNOSIS — E782 Mixed hyperlipidemia: Secondary | ICD-10-CM

## 2022-03-09 LAB — POCT GLYCOSYLATED HEMOGLOBIN (HGB A1C): HbA1c, POC (controlled diabetic range): 7.4 % — AB (ref 0.0–7.0)

## 2022-03-09 MED ORDER — FREESTYLE LIBRE 3 SENSOR MISC: 1.0000 | 2 refills | Status: DC

## 2022-03-09 MED ORDER — ROSUVASTATIN CALCIUM 20 MG PO TABS: 20.0000 mg | ORAL_TABLET | Freq: Every day | ORAL | 1 refills | Status: DC

## 2022-03-09 NOTE — Progress Notes (Signed)
03/09/2022, 6:22 PM   Endocrinology follow-up note  Subjective:    Patient ID: Jacob Baker, male    DOB: 1970-09-23.  Jacob Baker is being seen in follow-up after he was seen in consultation for management of currently uncontrolled symptomatic diabetes requested by  [name], MD.   Past Medical History:  Diagnosis Date   (HFpEF) heart failure with preserved ejection fraction ([place])    a. 12/2020 Echo: EF 65-70%, no rwma, mild LVH, nl RV fxn, triv MR.   CAD (coronary artery disease)    a. 12/2020 NSTEMI/PCI: LM nl, LAD min irregs, D1 99 (2.0x12 Onyx Frontier DES), LCX 85p (small), RCA 80p/m (4.0x18 Onyx Frontier DES - staged following day), 60d, RPDA mod dzs. EF 50-55%.   DVT (deep venous thrombosis) ([place]) 04/2017   GERD (gastroesophageal reflux disease)    History of migraine    Hyperlipidemia    Hypertension    Long term current use of antithrombotics/antiplatelets    a.) on daily DAPT therapy (ticagrelor + ASA)   Non-STEMI (non-ST elevated myocardial infarction) ([place]) 12/30/2020   Syncope and collapse    a.) single episode; related to dehydration   T2DM (type 2 diabetes mellitus) ([place])     Past Surgical History:  Procedure Laterality Date   CHOLECYSTECTOMY     COLONOSCOPY     COLONOSCOPY WITH PROPOFOL N/A 08/06/2017   Procedure: COLONOSCOPY WITH PROPOFOL;  Surgeon: Lucilla Lame, MD;  Location: [facility];  Service: Endoscopy;  Laterality: N/A;  Diabetic - oral meds   CORONARY STENT INTERVENTION N/A 12/31/2020   Procedure: CORONARY STENT INTERVENTION;  Surgeon: Belva Crome, MD;  Location: [facility];  Service: Cardiovascular;  Laterality: N/A;   CORONARY STENT INTERVENTION N/A 01/01/2021   Procedure: CORONARY STENT INTERVENTION;  Surgeon: [name], MD;  Location: [facility];  Service: Cardiovascular;  Laterality: N/A;   ESOPHAGEAL DILATION   09/24/2016   Procedure: ESOPHAGEAL DILATION;  Surgeon: Lucilla Lame, MD;  Location: [facility];  Service: Gastroenterology;;   ESOPHAGOGASTRODUODENOSCOPY (EGD) WITH PROPOFOL N/A 04/08/2015   Procedure: ESOPHAGOGASTRODUODENOSCOPY (EGD) WITH PROPOFOL with dialation;  Surgeon: Lucilla Lame, MD;  Location: [facility];  Service: Endoscopy;  Laterality: N/A;  diabetic - oral meds   ESOPHAGOGASTRODUODENOSCOPY (EGD) WITH PROPOFOL N/A 09/24/2016   Procedure: ESOPHAGOGASTRODUODENOSCOPY (EGD) WITH PROPOFOL WITH DILATION;  Surgeon: Lucilla Lame, MD;  Location: [facility];  Service: Gastroenterology;  Laterality: N/A;  Diabetic - oral meds   FINGER AMPUTATION  01/27/2007   partial removal left index finger    LEFT HEART CATH AND CORONARY ANGIOGRAPHY N/A 12/31/2020   Procedure: LEFT HEART CATH AND CORONARY ANGIOGRAPHY;  Surgeon: Belva Crome, MD;  Location: [facility];  Service: Cardiovascular;  Laterality: N/A;   LEFT HEART CATH AND CORONARY ANGIOGRAPHY N/A 01/01/2021   Procedure: LEFT HEART CATH AND CORONARY ANGIOGRAPHY;  Surgeon: [name], MD;  Location: [facility];  Service: Cardiovascular;  Laterality: N/A;   POLYPECTOMY  08/06/2017   Procedure: POLYPECTOMY INTESTINAL;  Surgeon: Lucilla Lame, MD;  Location: [facility];  Service: Endoscopy;;  Social History   Socioeconomic History   Marital status: Married    Spouse name: Larene Beach   Number of children: 4   Years of education: Not on file   Highest education level: Not on file  Occupational History   Not on file  Tobacco Use   Smoking status: Former    Packs/day: 2.00    Years: 27.00    Total pack years: 54.00    Types: Cigarettes    Quit date: 12/30/2020    Years since quitting: 1.1    Passive exposure: Never   Smokeless tobacco: Never  Vaping Use   Vaping Use: Never used  Substance and Sexual Activity   Alcohol use: No   Drug use: No   Sexual activity: Not on file   Other Topics Concern   Not on file  Social History Narrative   Not on file   Social Determinants of Health   Financial Resource Strain: Not on file  Food Insecurity: Not on file  Transportation Needs: Not on file  Physical Activity: Not on file  Stress: Not on file  Social Connections: Not on file    Family History  Problem Relation Age of Onset   Hypertension Mother    Heart disease Father        CAD   Hypertension Father    Diabetes Father    Hypertension Sister    Hypertension Brother    Hypertension Brother     Outpatient Encounter Medications as of 03/09/2022  Medication Sig   Continuous Blood Gluc Sensor (FREESTYLE LIBRE 3 SENSOR) MISC 1 Piece by Does not apply route every 14 (fourteen) days. Place 1 sensor on the skin every 14 days. Use to check glucose continuously   albuterol (VENTOLIN HFA) 108 (90 Base) MCG/ACT inhaler Inhale 2 puffs into the lungs every 6 (six) hours as needed for wheezing or shortness of breath.   amLODipine (NORVASC) 10 MG tablet Take 1 tablet (10 mg total) by mouth daily.   aspirin 81 MG EC tablet Take 1 tablet (81 mg total) by mouth daily with breakfast. Swallow whole.   Budeson-Glycopyrrol-Formoterol (BREZTRI AEROSPHERE) 160-9-4.8 MCG/ACT AERO Inhale 2 puffs into the lungs 2 (two) times daily.   Budeson-Glycopyrrol-Formoterol (BREZTRI AEROSPHERE) 160-9-4.8 MCG/ACT AERO Inhale 2 puffs into the lungs in the morning and at bedtime.   glucose blood (ONETOUCH VERIO) test strip Test sugars twice daily   metFORMIN (GLUCOPHAGE) 850 MG tablet TAKE 1 TABLET(850 MG) BY MOUTH TWICE DAILY WITH A MEAL   metoprolol tartrate (LOPRESSOR) 25 MG tablet TAKE 1 TABLET BY MOUTH TWICE DAILY   nitroGLYCERIN (NITROSTAT) 0.4 MG SL tablet Place 1 tablet (0.4 mg total) under the tongue every 5 (five) minutes as needed for chest pain.   ONETOUCH DELICA LANCETS 99991111 MISC Test sugars twice daily   pantoprazole (PROTONIX) 40 MG tablet Take 1 tablet (40 mg total) by mouth  daily. TAKE 1 TABLET(40 MG) BY MOUTH TWICE DAILY   ranolazine (RANEXA) 500 MG 12 hr tablet Take 1 tablet (500 mg total) by mouth 2 (two) times daily.   rosuvastatin (CRESTOR) 20 MG tablet Take 1 tablet (20 mg total) by mouth daily.   ticagrelor (BRILINTA) 90 MG TABS tablet Take 1 tablet (90 mg total) by mouth 2 (two) times daily.   vitamin B-12 (CYANOCOBALAMIN) 1000 MCG tablet Take 1,000 mcg by mouth daily.   [DISCONTINUED] rosuvastatin (CRESTOR) 40 MG tablet Take 40 mg by mouth daily.   No facility-administered encounter medications on file as of  03/09/2022.    ALLERGIES: No Known Allergies  VACCINATION STATUS: Immunization History  Administered Date(s) Administered   Influenza Split 12/15/2013   Influenza,inj,Quad PF,6+ Mos 10/26/2014, 10/14/2015, 10/13/2017, 10/18/2018, 10/20/2019, 10/21/2020, 10/21/2021   Influenza-Unspecified 09/26/2016   Moderna Sars-Covid-2 Vaccination 04/28/2019, 05/30/2019   PNEUMOCOCCAL CONJUGATE-20 10/21/2021   Pneumococcal Polysaccharide-23 10/14/2015   Tdap 10/26/2014    Diabetes He presents for his follow-up diabetic visit. He has type 2 diabetes mellitus. His disease course has been worsening. There are no hypoglycemic associated symptoms. Pertinent negatives for hypoglycemia include no confusion, headaches, pallor or seizures. Pertinent negatives for diabetes include no chest pain, no fatigue, no polydipsia, no polyphagia, no polyuria and no weakness. Symptoms are worsening. Diabetic complications include heart disease. (In the interim , he developed chest pain, hospitalized and diagnosed with ST Elevation MI. He is s/p stent placement.) Risk factors for coronary artery disease include dyslipidemia, diabetes mellitus, male sex, family history and tobacco exposure. Current diabetic treatment includes oral agent (triple therapy). His weight is increasing steadily. He is following a generally unhealthy diet. When asked about meal planning, he reported none. He  has not had a previous visit with a dietitian. He rarely participates in exercise. His home blood glucose trend is increasing steadily. (He presents with slight loss of control of glycemia with point-of-care A1c of 7.4%.  He admits to dietary indiscretions since last visit.    )  Hyperlipidemia This is a chronic problem. The current episode started more than 1 year ago. The problem is uncontrolled. Exacerbating diseases include diabetes. Pertinent negatives include no chest pain, myalgias or shortness of breath. Current antihyperlipidemic treatment includes statins. Risk factors for coronary artery disease include diabetes mellitus, dyslipidemia, male sex and family history.  Hypertension This is a chronic problem. The current episode started more than 1 year ago. The problem is controlled. Pertinent negatives include no chest pain, headaches, neck pain, palpitations or shortness of breath. Risk factors for coronary artery disease include family history, dyslipidemia, diabetes mellitus, male gender and smoking/tobacco exposure. Past treatments include calcium channel blockers. Hypertensive end-organ damage includes CAD/MI.       Objective:    BP 120/76   Pulse 72   Ht 6' 1"$  (1.854 m)   Wt 193 lb 6.4 oz (87.7 kg)   BMI 25.52 kg/m   Wt Readings from Last 3 Encounters:  03/09/22 193 lb 6.4 oz (87.7 kg)  11/21/21 198 lb (89.8 kg)  10/29/21 191 lb 9.6 oz (86.9 kg)       CMP ( most recent) CMP     Component Value Date/Time   NA 140 03/05/2022 0803   NA 138 10/04/2013 2025   K 4.4 03/05/2022 0803   K 3.0 (L) 10/04/2013 2025   CL 102 03/05/2022 0803   CL 104 10/04/2013 2025   CO2 22 03/05/2022 0803   CO2 25 10/04/2013 2025   GLUCOSE 198 (H) 03/05/2022 0803   GLUCOSE 117 (H) 08/18/2021 1613   GLUCOSE 175 (H) 10/04/2013 2025   BUN 11 03/05/2022 0803   BUN 13 10/04/2013 2025   CREATININE 1.05 03/05/2022 0803   CREATININE 1.04 11/16/2019 1505   CALCIUM 9.5 03/05/2022 0803    CALCIUM 9.2 10/04/2013 2025   PROT 6.5 03/05/2022 0803   PROT 7.4 10/04/2013 2025   ALBUMIN 4.8 03/05/2022 0803   ALBUMIN 4.1 10/04/2013 2025   AST 11 03/05/2022 0803   AST 8 (L) 10/04/2013 2025   ALT 17 03/05/2022 0803   ALT 19 10/04/2013 2025  ALKPHOS 97 03/05/2022 0803   ALKPHOS 114 10/04/2013 2025   BILITOT 0.8 03/05/2022 0803   BILITOT 0.4 10/04/2013 2025   GFRNONAA >60 06/30/2021 0857   GFRNONAA 83 11/16/2019 1505   GFRAA 96 11/16/2019 1505     Diabetic Labs (most recent): Lab Results  Component Value Date   HGBA1C 7.4 (A) 03/09/2022   HGBA1C 5.7 (H) 06/30/2021   HGBA1C 4.9 03/28/2021   MICROALBUR 46.3 (H) 10/22/2021   MICROALBUR 30 05/22/2020   MICROALBUR 2.6 10/20/2019     Lipid Panel ( most recent) Lipid Panel     Component Value Date/Time   CHOL 118 03/05/2022 0803   CHOL 180 03/23/2013 0422   TRIG 195 (H) 03/05/2022 0803   TRIG 218 (H) 03/23/2013 0422   HDL 32 (L) 03/05/2022 0803   HDL 21 (L) 03/23/2013 0422   CHOLHDL 3.7 03/05/2022 0803   CHOLHDL 3.5 08/18/2021 1613   VLDL 31 03/28/2021 0852   VLDL 44 (H) 03/23/2013 0422   LDLCALC 54 03/05/2022 0803   LDLCALC 46 08/18/2021 1613   LDLCALC 115 (H) 03/23/2013 0422   LDLDIRECT 176.0 10/18/2018 1406   LABVLDL 32 03/05/2022 0803      Lab Results  Component Value Date   TSH 1.49 08/18/2021   TSH 1.32 11/16/2019   TSH 1.38 10/18/2018   TSH 1.07 06/11/2016   TSH 1.66 03/12/2014   TSH 1.140 11/24/2013   TSH 1.42 02/25/2012   FREET4 1.2 11/16/2019   FREET4 1.00 04/23/2014      Assessment & Plan:   1. Uncontrolled type 2 diabetes mellitus with CAD - QUARON BARRENTINE has currently uncontrolled symptomatic type 2 DM since  52 years of age.  He presents with slight loss of control of glycemia with point-of-care A1c of 7.4%.  He admits to dietary indiscretions since last visit.      Recent labs reviewed, showing normal renal function. - I had a long discussion with him about the progressive nature  of diabetes and the pathology behind its complications. -his diabetes is complicated by CAD s/p stent placement, smoking and he remains at a high risk for more acute and chronic complications which include CVA, CKD, retinopathy, and neuropathy. These are all discussed in detail with him.  - I have counseled him on diet  and weight management  by adopting a carbohydrate restricted/protein rich diet. Patient is encouraged to switch to  unprocessed or minimally processed     complex starch and increased protein intake (animal or plant source), fruits, and vegetables. -  he is advised to stick to a routine mealtimes to eat 3 meals  a day and avoid unnecessary snacks ( to snack only to correct hypoglycemia).   - he acknowledges that there is a room for improvement in his food and drink choices. - Suggestion is made for him to avoid simple carbohydrates  from his diet including Cakes, Sweet Desserts, Ice Cream, Soda (diet and regular), Sweet Tea, Candies, Chips, Cookies, Store Bought Juices, Alcohol , Artificial Sweeteners,  Coffee Creamer, and "Sugar-free" Products, Lemonade. This will help patient to have more stable blood glucose profile and potentially avoid unintended weight gain.  The following Lifestyle Medicine recommendations according to [facility]) were discussed and and offered to patient and he  agrees to start the journey:  A. Whole Foods, Plant-Based Nutrition comprising of fruits and vegetables, plant-based proteins, whole-grain carbohydrates was discussed in detail with the patient.   A list for  source of those nutrients were also provided to the patient.  Patient will use only water or unsweetened tea for hydration. B.  The need to stay away from risky substances including alcohol, smoking; obtaining 7 to 9 hours of restorative sleep, at least 150 minutes of moderate intensity exercise weekly, the importance of healthy social connections,  and stress  management techniques were discussed. C.  A full color page of  Calorie density of various food groups per pound showing examples of each food groups was provided to the patient.   - he will be scheduled with Jearld Fenton, RDN, CDE for diabetes education.  - I have approached him with the following individualized plan to manage  his diabetes and patient agrees:   -He will not need insulin treatment for now.  He is advised to continue on the increased metformin at 150 mg p.o. twice daily with breakfast and supper.   He is asking for a CGM.  I discussed and prescribed the freestyle libre 3 device for him.  If he returns with A1c greater than 7%, he will be considered for low-dose Farxiga or glipizide.  - Specific targets for  A1c;  LDL, HDL, Triglycerides, and  Waist Circumference were discussed with the patient.  2) Blood Pressure /Hypertension: -His blood pressure is controlled to target.  He is advised to continue metoprolol 25 mg p.o. twice daily and lisinopril 20 mg p.o. daily.  During his next visit, he will be considered for stopping metoprolol.      3) Lipids/Hyperlipidemia:   Review of his recent repeat lipid panel showed improved LDL to 54.  I discussed and lowered his Crestor to 20 mg p.o. nightly.     Side effects and precautions discussed with him.     4)  Weight/Diet:  Body mass index is 25.52 kg/m.  -If patient has 25+ pounds, he is not a candidate for major weight loss.  Exercise, and detailed carbohydrates information provided  -  detailed on discharge instructions.  5) Chronic Care/Health Maintenance:  -he  is on  Statin medications and  is encouraged to initiate and continue to follow up with Ophthalmology, Dentist,  Podiatrist at least yearly or according to recommendations, and has successfully quit smoking.    -  I have recommended yearly flu vaccine and pneumonia vaccine at least every 5 years; moderate intensity exercise for up to 150 minutes weekly; and  sleep  for at least 7 hours a day.  - he is  advised to maintain close follow up with [name], MD for primary care needs, as well as his other providers for optimal and coordinated care.   I spent  26  minutes in the care of the patient today including review of labs from Thyroid Function, CMP, and other relevant labs ; imaging/biopsy records (current and previous including abstractions from other facilities); face-to-face time discussing  his lab results and symptoms, medications doses, his options of short and long term treatment based on the latest standards of care / guidelines;   and documenting the encounter.  Jacob Baker  participated in the discussions, expressed understanding, and voiced agreement with the above plans.  All questions were answered to his satisfaction. he is encouraged to contact clinic should he have any questions or concerns prior to his return visit.    Follow up plan: - Return for Bring Meter/CGM Device/Logs- A1c in Office.  Glade Lloyd, MD [facility] [address]  [place], [place] [address] Phone: [phone]  Fax: [phone]    03/09/2022, 6:22 PM  This note was partially dictated with voice recognition software. Similar sounding words can be transcribed inadequately or may not  be corrected upon review.

## 2022-03-09 NOTE — Patient Instructions (Signed)

## 2022-03-22 ENCOUNTER — Encounter: Payer: Self-pay | Admitting: Internal Medicine

## 2022-03-27 ENCOUNTER — Ambulatory Visit: Payer: BC Managed Care – PPO | Admitting: Internal Medicine

## 2022-04-14 ENCOUNTER — Ambulatory Visit: Payer: BC Managed Care – PPO | Admitting: Internal Medicine

## 2022-04-14 ENCOUNTER — Encounter: Payer: Self-pay | Admitting: Internal Medicine

## 2022-04-14 VITALS — BP 130/74 | HR 76 | Temp 98.1°F | Ht 73.0 in | Wt 197.2 lb

## 2022-04-14 DIAGNOSIS — I129 Hypertensive chronic kidney disease with stage 1 through stage 4 chronic kidney disease, or unspecified chronic kidney disease: Secondary | ICD-10-CM | POA: Diagnosis not present

## 2022-04-14 DIAGNOSIS — E1129 Type 2 diabetes mellitus with other diabetic kidney complication: Secondary | ICD-10-CM

## 2022-04-14 DIAGNOSIS — G8929 Other chronic pain: Secondary | ICD-10-CM | POA: Diagnosis not present

## 2022-04-14 DIAGNOSIS — N401 Enlarged prostate with lower urinary tract symptoms: Secondary | ICD-10-CM | POA: Diagnosis not present

## 2022-04-14 DIAGNOSIS — F419 Anxiety disorder, unspecified: Secondary | ICD-10-CM

## 2022-04-14 DIAGNOSIS — E782 Mixed hyperlipidemia: Secondary | ICD-10-CM

## 2022-04-14 DIAGNOSIS — N138 Other obstructive and reflux uropathy: Secondary | ICD-10-CM

## 2022-04-14 DIAGNOSIS — I10 Essential (primary) hypertension: Secondary | ICD-10-CM | POA: Diagnosis not present

## 2022-04-14 DIAGNOSIS — E1169 Type 2 diabetes mellitus with other specified complication: Secondary | ICD-10-CM

## 2022-04-14 DIAGNOSIS — M545 Low back pain, unspecified: Secondary | ICD-10-CM | POA: Diagnosis not present

## 2022-04-14 DIAGNOSIS — R809 Proteinuria, unspecified: Secondary | ICD-10-CM

## 2022-04-14 NOTE — Patient Instructions (Addendum)
Your kidneys are healthy.  I will check a urinalysis to be complete,  but  I believe your back pain is coming from your full bladder,  not your kidneys.  This may be due to your enlarged prostate, so I am referring you to [facility]  in [place] reaction to telmisartan may be from atherosclerosis causing reduced blood flow to the kidneys.  Stay off the telmisartan.  A referral to a vascular surgery group in [place] in  process , not for surgery,  but to have the blood flow to your kidneys measured to rule out "renal artery stenosis"  Your carbohydrates are what raises your blood sugars.  Pasta,  bread , cereal,  and rice .   The best breakfast is a low carb protein drink.  Not sure Jacob Baker is low carb.  Recommended Low Carb high Protein premixed Shakes:   Premier Protein  Atkins Advantage Muscle Milk EAS AdvantEdge   All of these are available at [employer], [employer],  [employer]  And taste good   [employer] makes their own Equate version of each one.

## 2022-04-14 NOTE — Progress Notes (Unsigned)
Subjective:  Patient ID: Jacob Baker, male    DOB: 07/28/1970  Age: 52 y.o. MRN: [id number]  CC: There were no encounter diagnoses.   HPI Jacob Baker presents for  Chief Complaint  Patient presents with   Medical Management of Chronic Issues    Follow up on kidney function     1) low back pain since late January. Aggravated  by need to urinate .   Described as a stabbing pain  that is relieved by emptying his bladder,  Reviewed recent labs , cr normal.  Lab Results  Component Value Date   CREATININE 1.05 03/05/2022     Urge to urinate is not present until back hurts. Has ben noting incomplete  emptying    .HTN with microalbuminuria:  he  stopped telmisartan and  reumed amlodipine due to elevation in blood pressure to 140/98.      Reviewed last imaging of kidneys form Dec 2022 CT abd and pelvis Adrenals/Urinary Tract: Adrenal glands are within normal limits. Subcentimeter right renal cysts. Left kidney is within normal limits. No hydronephrosis"    Reviewed spine indirectly imaged via CT abd and pelvis in Dec 2022: "Degenerated right L5-S1 assimilation joint. Chronic bony overgrowth and osteophytosis at lower thoracic costovertebral junctions>"    Outpatient Medications Prior to Visit  Medication Sig Dispense Refill   albuterol (VENTOLIN HFA) 108 (90 Base) MCG/ACT inhaler Inhale 2 puffs into the lungs every 6 (six) hours as needed for wheezing or shortness of breath. 8 g 1   amLODipine (NORVASC) 10 MG tablet Take 1 tablet (10 mg total) by mouth daily. 90 tablet 1   aspirin 81 MG EC tablet Take 1 tablet (81 mg total) by mouth daily with breakfast. Swallow whole. 90 tablet 3   Budeson-Glycopyrrol-Formoterol (BREZTRI AEROSPHERE) 160-9-4.8 MCG/ACT AERO Inhale 2 puffs into the lungs 2 (two) times daily. 10.7 g 11   Budeson-Glycopyrrol-Formoterol (BREZTRI AEROSPHERE) 160-9-4.8 MCG/ACT AERO Inhale 2 puffs into the lungs in the morning and at bedtime. 23.6 g 0   Continuous  Blood Gluc Sensor (FREESTYLE LIBRE 3 SENSOR) MISC 1 Piece by Does not apply route every 14 (fourteen) days. Place 1 sensor on the skin every 14 days. Use to check glucose continuously 2 each 2   glucose blood (ONETOUCH VERIO) test strip Test sugars twice daily 50 each 6   metFORMIN (GLUCOPHAGE) 850 MG tablet TAKE 1 TABLET(850 MG) BY MOUTH TWICE DAILY WITH A MEAL 180 tablet 0   metoprolol tartrate (LOPRESSOR) 25 MG tablet TAKE 1 TABLET BY MOUTH TWICE DAILY 60 tablet 11   nitroGLYCERIN (NITROSTAT) 0.4 MG SL tablet Place 1 tablet (0.4 mg total) under the tongue every 5 (five) minutes as needed for chest pain. 25 tablet 1   ONETOUCH DELICA LANCETS 99991111 MISC Test sugars twice daily 50 each 3   pantoprazole (PROTONIX) 40 MG tablet Take 1 tablet (40 mg total) by mouth daily. TAKE 1 TABLET(40 MG) BY MOUTH TWICE DAILY 90 tablet 3   ranolazine (RANEXA) 500 MG 12 hr tablet Take 1 tablet (500 mg total) by mouth 2 (two) times daily. 180 tablet 3   rosuvastatin (CRESTOR) 20 MG tablet Take 1 tablet (20 mg total) by mouth daily. 90 tablet 1   ticagrelor (BRILINTA) 90 MG TABS tablet Take 1 tablet (90 mg total) by mouth 2 (two) times daily. 60 tablet 11   vitamin B-12 (CYANOCOBALAMIN) 1000 MCG tablet Take 1,000 mcg by mouth daily.     No facility-administered medications prior  to visit.    Review of Systems;  Patient denies headache, fevers, malaise, unintentional weight loss, skin rash, eye pain, sinus congestion and sinus pain, sore throat, dysphagia,  hemoptysis , cough, dyspnea, wheezing, chest pain, palpitations, orthopnea, edema, abdominal pain, nausea, melena, diarrhea, constipation, flank pain, dysuria, hematuria, urinary  Frequency, nocturia, numbness, tingling, seizures,  Focal weakness, Loss of consciousness,  Tremor, insomnia, depression, anxiety, and suicidal ideation.      Objective:  There were no vitals taken for this visit.  BP Readings from Last 3 Encounters:  03/09/22 120/76  11/21/21  138/68  10/29/21 118/78    Wt Readings from Last 3 Encounters:  03/09/22 193 lb 6.4 oz (87.7 kg)  11/21/21 198 lb (89.8 kg)  10/29/21 191 lb 9.6 oz (86.9 kg)    Physical Exam  Lab Results  Component Value Date   HGBA1C 7.4 (A) 03/09/2022   HGBA1C 5.7 (H) 06/30/2021   HGBA1C 4.9 03/28/2021    Lab Results  Component Value Date   CREATININE 1.05 03/05/2022   CREATININE 1.17 08/18/2021   CREATININE 0.71 06/30/2021    Lab Results  Component Value Date   WBC 6.5 08/18/2021   HGB 10.6 (L) 08/18/2021   HCT 30.2 (L) 08/18/2021   PLT 250.0 08/18/2021   GLUCOSE 198 (H) 03/05/2022   CHOL 118 03/05/2022   TRIG 195 (H) 03/05/2022   HDL 32 (L) 03/05/2022   LDLDIRECT 176.0 10/18/2018   LDLCALC 54 03/05/2022   ALT 17 03/05/2022   AST 11 03/05/2022   NA 140 03/05/2022   K 4.4 03/05/2022   CL 102 03/05/2022   CREATININE 1.05 03/05/2022   BUN 11 03/05/2022   CO2 22 03/05/2022   TSH 1.49 08/18/2021   PSA 0.45 10/23/2021   INR 0.96 10/13/2011   HGBA1C 7.4 (A) 03/09/2022   MICROALBUR 46.3 (H) 10/22/2021    CT CHEST HIGH RESOLUTION  Result Date: 11/07/2021 CLINICAL DATA:  52 year old male with history of restrictive lung disease. Evaluate for interstitial lung disease. EXAM: CT CHEST WITHOUT CONTRAST TECHNIQUE: Multidetector CT imaging of the chest was performed following the standard protocol without intravenous contrast. High resolution imaging of the lungs, as well as inspiratory and expiratory imaging, was performed. RADIATION DOSE REDUCTION: This exam was performed according to the departmental dose-optimization program which includes automated exposure control, adjustment of the mA and/or kV according to patient size and/or use of iterative reconstruction technique. COMPARISON:  No priors. FINDINGS: Cardiovascular: Heart size is normal. There is no significant pericardial fluid, thickening or pericardial calcification. There is aortic atherosclerosis, as well as atherosclerosis  of the great vessels of the mediastinum and the coronary arteries, including calcified atherosclerotic plaque in the left anterior descending and right coronary arteries. Mediastinum/Nodes: No pathologically enlarged mediastinal or hilar lymph nodes. Please note that accurate exclusion of hilar adenopathy is limited on noncontrast CT scans. Esophagus is unremarkable in appearance. No axillary lymphadenopathy. Lungs/Pleura: High-resolution images demonstrate no significant regions of ground-glass attenuation, septal thickening, subpleural reticulation, parenchymal banding, traction bronchiectasis or frank honeycombing to indicate interstitial lung disease. Inspiratory and expiratory imaging demonstrates some mild air trapping indicative of small airways disease. No acute consolidative airspace disease. No pleural effusions. 3 mm subpleural nodule in the right upper lobe abutting the minor fissure (axial image 149 of series 4), stable compared to prior studies, considered definitively benign (presumably a subpleural lymph node). Upper Abdomen: Unremarkable. Musculoskeletal: There are no aggressive appearing lytic or blastic lesions noted in the visualized portions of the skeleton.  IMPRESSION: 1. No evidence of interstitial lung disease. 2. Mild air trapping indicative of small airways disease. 3. Aortic atherosclerosis, in addition to two-vessel coronary artery disease. Please note that although the presence of coronary artery calcium documents the presence of coronary artery disease, the severity of this disease and any potential stenosis cannot be assessed on this non-gated CT examination. Assessment for potential risk factor modification, dietary therapy or pharmacologic therapy may be warranted, if clinically indicated. Aortic Atherosclerosis (ICD10-I70.0). Electronically Signed   By: Vinnie Langton M.D.   On: 11/07/2021 09:48    Assessment & Plan:  .There are no diagnoses linked to this encounter.   I  provided 30 minutes of face-to-face time during this encounter reviewing patient's last visit with me, patient's  most recent visit with cardiology,  nephrology,  and neurology,  recent surgical and non surgical procedures, previous  labs and imaging studies, counseling on currently addressed issues,  and post visit ordering to diagnostics and therapeutics .   Follow-up: No follow-ups on file.   [name], MD

## 2022-04-15 ENCOUNTER — Other Ambulatory Visit: Payer: Self-pay | Admitting: "Endocrinology

## 2022-04-15 ENCOUNTER — Encounter: Payer: Self-pay | Admitting: "Endocrinology

## 2022-04-15 LAB — URINALYSIS, ROUTINE W REFLEX MICROSCOPIC
Bilirubin Urine: NEGATIVE
Hgb urine dipstick: NEGATIVE
Ketones, ur: NEGATIVE
Leukocytes,Ua: NEGATIVE
Nitrite: NEGATIVE
RBC / HPF: NONE SEEN (ref 0–?)
Specific Gravity, Urine: 1.02 (ref 1.000–1.030)
Total Protein, Urine: NEGATIVE
Urine Glucose: NEGATIVE
Urobilinogen, UA: 0.2 (ref 0.0–1.0)
WBC, UA: NONE SEEN (ref 0–?)
pH: 5.5 (ref 5.0–8.0)

## 2022-04-15 MED ORDER — EMPAGLIFLOZIN 10 MG PO TABS: 10.0000 mg | ORAL_TABLET | Freq: Every day | ORAL | 2 refills | Status: DC

## 2022-04-16 ENCOUNTER — Other Ambulatory Visit: Payer: Self-pay | Admitting: "Endocrinology

## 2022-04-16 DIAGNOSIS — M549 Dorsalgia, unspecified: Secondary | ICD-10-CM | POA: Insufficient documentation

## 2022-04-16 MED ORDER — GLIPIZIDE ER 5 MG PO TB24: 5.0000 mg | ORAL_TABLET | Freq: Every day | ORAL | 3 refills | Status: DC

## 2022-04-16 NOTE — Assessment & Plan Note (Addendum)
Review of chart notes that he was treated with lisinopril for several years and was recently changed to telmisartan due to edema with amlodipine and BP became elevated,  so he is not currently on ACE I or AR B until RAS can be ruled out.  Counselled on diet. Advised to return to me or his endocrinologist in a few weeks to focus on diabetes management/

## 2022-04-16 NOTE — Assessment & Plan Note (Addendum)
Here is no evidence of renal dysfunction, cystitis,  or nephrolothiasis  based on UA and  prior repeated imaging.  Etiology may be related to BPH with LUTS given his report of intermittent pain  occurring when bladder is full,  reports in incomplete  emptying,  decreased urge to urinate except when back pain is present , and evidence of enlarged prostate on prior imaging.  Referring to [facility] urology

## 2022-04-16 NOTE — Assessment & Plan Note (Signed)
Workup for secondary causes recommended given recent elevation reported with use of telmisartan. Continue metoprolol and amlodipine  .  Referring to [place] [facility] [facility] for renal artery doppler to rule out RAS

## 2022-04-16 NOTE — Progress Notes (Signed)
Subjective:  Patient ID: Jacob Baker, male    DOB: 1970-11-11  Age: 52 y.o. MRN: [id number]  CC: The primary encounter diagnosis was Chronic bilateral low back pain without sciatica. Diagnoses of Renal hypertension, BPH with obstruction/lower urinary tract symptoms, Essential hypertension, benign, and Proteinuria due to type 2 diabetes mellitus ([place]) were also pertinent to this visit.   HPI Jacob Baker presents for evaluation of low back pain.  He is accompanied by his wife.  Chief Complaint  Patient presents with   Medical Management of Chronic Issues    Follow up on kidney function     1) [place] reports intermittent low back pain since late January. The pain is aggravated by a full bladder an dthe need to urinate .   Described as a stabbing pain  that is relieved by emptying of his bladder,.  He has become concerned that his kidneys are failing.     He does report incomplete  emptying  most days  We Reviewed last imaging of kidneys from Dec 2022 CT abd and pelvis Adrenals/Urinary Tract: Adrenal glands are within normal limits. Subcentimeter right renal cysts. Left kidney is within normal limits. No hydronephrosis".  We reviewed  Reviewed lumbar spine images spine indirectly viewed via CT abd and pelvis in Dec 2022: "Degenerated right L5-S1 assimilation joint. Chronic bony overgrowth and osteophytosis at lower thoracic costovertebral junctions>"  WE  Reviewed recent labs ,and his GFR has been normal for years and is still normal .  We reviewed prior imaging studies noting an enlarged prostate.  2) .HTN with microalbuminuria:  he  reports that he stopped telmisartan and  resumed amlodipine due to elevation in blood pressure to 140/98 while taking telmisartan.  There was no known change in GFR or K   3) Uncontrolled diabetes;  Burlin sees an endocrinologist for management and wears a CBG monitor.  He is concerned about his prandial elevations..  we reviewed his medications and his diet; he  has not been limiting his carbohydrates/starches but he is avoiding candy and sugary drinks.  He admits that he has  a limited understanding  of the effect of carbohydrates on his blood sugars   Outpatient Medications Prior to Visit  Medication Sig Dispense Refill   albuterol (VENTOLIN HFA) 108 (90 Base) MCG/ACT inhaler Inhale 2 puffs into the lungs every 6 (six) hours as needed for wheezing or shortness of breath. 8 g 1   amLODipine (NORVASC) 10 MG tablet Take 1 tablet (10 mg total) by mouth daily. 90 tablet 1   aspirin 81 MG EC tablet Take 1 tablet (81 mg total) by mouth daily with breakfast. Swallow whole. 90 tablet 3   Continuous Blood Gluc Sensor (FREESTYLE LIBRE 3 SENSOR) MISC 1 Piece by Does not apply route every 14 (fourteen) days. Place 1 sensor on the skin every 14 days. Use to check glucose continuously 2 each 2   glucose blood (ONETOUCH VERIO) test strip Test sugars twice daily 50 each 6   metFORMIN (GLUCOPHAGE) 850 MG tablet TAKE 1 TABLET(850 MG) BY MOUTH TWICE DAILY WITH A MEAL 180 tablet 0   metoprolol tartrate (LOPRESSOR) 25 MG tablet TAKE 1 TABLET BY MOUTH TWICE DAILY 60 tablet 11   ONETOUCH DELICA LANCETS 99991111 MISC Test sugars twice daily 50 each 3   pantoprazole (PROTONIX) 40 MG tablet Take 1 tablet (40 mg total) by mouth daily. TAKE 1 TABLET(40 MG) BY MOUTH TWICE DAILY 90 tablet 3   ranolazine (RANEXA) 500 MG  12 hr tablet Take 1 tablet (500 mg total) by mouth 2 (two) times daily. 180 tablet 3   rosuvastatin (CRESTOR) 20 MG tablet Take 1 tablet (20 mg total) by mouth daily. 90 tablet 1   ticagrelor (BRILINTA) 90 MG TABS tablet Take 1 tablet (90 mg total) by mouth 2 (two) times daily. 60 tablet 11   vitamin B-12 (CYANOCOBALAMIN) 1000 MCG tablet Take 1,000 mcg by mouth daily.     nitroGLYCERIN (NITROSTAT) 0.4 MG SL tablet Place 1 tablet (0.4 mg total) under the tongue every 5 (five) minutes as needed for chest pain. 25 tablet 1   Budeson-Glycopyrrol-Formoterol (BREZTRI AEROSPHERE)  160-9-4.8 MCG/ACT AERO Inhale 2 puffs into the lungs 2 (two) times daily. (Patient not taking: Reported on 04/14/2022) 10.7 g 11   Budeson-Glycopyrrol-Formoterol (BREZTRI AEROSPHERE) 160-9-4.8 MCG/ACT AERO Inhale 2 puffs into the lungs in the morning and at bedtime. (Patient not taking: Reported on 04/14/2022) 23.6 g 0   No facility-administered medications prior to visit.    Review of Systems;  Patient denies headache, fevers, malaise, unintentional weight loss, skin rash, eye pain, sinus congestion and sinus pain, sore throat, dysphagia,  hemoptysis , cough, dyspnea, wheezing, chest pain, palpitations, orthopnea, edema, abdominal pain, nausea, melena, diarrhea, constipation, flank pain, dysuria, hematuria, urinary  Frequency, nocturia, numbness, tingling, seizures,  Focal weakness, Loss of consciousness,  Tremor, insomnia, depression, anxiety, and suicidal ideation.      Objective:  BP 130/74   Pulse 76   Temp 98.1 F (36.7 C) (Oral)   Ht 6\' 1"  (1.854 m)   Wt 197 lb 3.2 oz (89.4 kg)   SpO2 99%   BMI 26.02 kg/m   BP Readings from Last 3 Encounters:  04/14/22 130/74  03/09/22 120/76  11/21/21 138/68    Wt Readings from Last 3 Encounters:  04/14/22 197 lb 3.2 oz (89.4 kg)  03/09/22 193 lb 6.4 oz (87.7 kg)  11/21/21 198 lb (89.8 kg)    Physical Exam Vitals reviewed.  Constitutional:      General: He is not in acute distress.    Appearance: Normal appearance. He is normal weight. He is not ill-appearing, toxic-appearing or diaphoretic.  HENT:     Head: Normocephalic.  Eyes:     General: No scleral icterus.       Right eye: No discharge.        Left eye: No discharge.     Conjunctiva/sclera: Conjunctivae normal.  Cardiovascular:     Rate and Rhythm: Normal rate and regular rhythm.     Heart sounds: Normal heart sounds.  Pulmonary:     Effort: Pulmonary effort is normal. No respiratory distress.     Breath sounds: Normal breath sounds.  Musculoskeletal:        General:  Normal range of motion.     Cervical back: Normal range of motion.  Skin:    General: Skin is warm and dry.  Neurological:     General: No focal deficit present.     Mental Status: He is alert and oriented to person, place, and time. Mental status is at baseline.  Psychiatric:        Mood and Affect: Mood normal.        Behavior: Behavior normal.        Thought Content: Thought content normal.        Judgment: Judgment normal.     Lab Results  Component Value Date   HGBA1C 7.4 (A) 03/09/2022   HGBA1C 5.7 (H) 06/30/2021  HGBA1C 4.9 03/28/2021    Lab Results  Component Value Date   CREATININE 1.05 03/05/2022   CREATININE 1.17 08/18/2021   CREATININE 0.71 06/30/2021    Lab Results  Component Value Date   WBC 6.5 08/18/2021   HGB 10.6 (L) 08/18/2021   HCT 30.2 (L) 08/18/2021   PLT 250.0 08/18/2021   GLUCOSE 198 (H) 03/05/2022   CHOL 118 03/05/2022   TRIG 195 (H) 03/05/2022   HDL 32 (L) 03/05/2022   LDLDIRECT 176.0 10/18/2018   LDLCALC 54 03/05/2022   ALT 17 03/05/2022   AST 11 03/05/2022   NA 140 03/05/2022   K 4.4 03/05/2022   CL 102 03/05/2022   CREATININE 1.05 03/05/2022   BUN 11 03/05/2022   CO2 22 03/05/2022   TSH 1.49 08/18/2021   PSA 0.45 10/23/2021   INR 0.96 10/13/2011   HGBA1C 7.4 (A) 03/09/2022   MICROALBUR 46.3 (H) 10/22/2021    CT CHEST HIGH RESOLUTION  Result Date: 11/07/2021 CLINICAL DATA:  52 year old male with history of restrictive lung disease. Evaluate for interstitial lung disease. EXAM: CT CHEST WITHOUT CONTRAST TECHNIQUE: Multidetector CT imaging of the chest was performed following the standard protocol without intravenous contrast. High resolution imaging of the lungs, as well as inspiratory and expiratory imaging, was performed. RADIATION DOSE REDUCTION: This exam was performed according to the departmental dose-optimization program which includes automated exposure control, adjustment of the mA and/or kV according to patient size  and/or use of iterative reconstruction technique. COMPARISON:  No priors. FINDINGS: Cardiovascular: Heart size is normal. There is no significant pericardial fluid, thickening or pericardial calcification. There is aortic atherosclerosis, as well as atherosclerosis of the great vessels of the mediastinum and the coronary arteries, including calcified atherosclerotic plaque in the left anterior descending and right coronary arteries. Mediastinum/Nodes: No pathologically enlarged mediastinal or hilar lymph nodes. Please note that accurate exclusion of hilar adenopathy is limited on noncontrast CT scans. Esophagus is unremarkable in appearance. No axillary lymphadenopathy. Lungs/Pleura: High-resolution images demonstrate no significant regions of ground-glass attenuation, septal thickening, subpleural reticulation, parenchymal banding, traction bronchiectasis or frank honeycombing to indicate interstitial lung disease. Inspiratory and expiratory imaging demonstrates some mild air trapping indicative of small airways disease. No acute consolidative airspace disease. No pleural effusions. 3 mm subpleural nodule in the right upper lobe abutting the minor fissure (axial image 149 of series 4), stable compared to prior studies, considered definitively benign (presumably a subpleural lymph node). Upper Abdomen: Unremarkable. Musculoskeletal: There are no aggressive appearing lytic or blastic lesions noted in the visualized portions of the skeleton. IMPRESSION: 1. No evidence of interstitial lung disease. 2. Mild air trapping indicative of small airways disease. 3. Aortic atherosclerosis, in addition to two-vessel coronary artery disease. Please note that although the presence of coronary artery calcium documents the presence of coronary artery disease, the severity of this disease and any potential stenosis cannot be assessed on this non-gated CT examination. Assessment for potential risk factor modification, dietary therapy  or pharmacologic therapy may be warranted, if clinically indicated. Aortic Atherosclerosis (ICD10-I70.0). Electronically Signed   By: Vinnie Langton M.D.   On: 11/07/2021 09:48    Assessment & Plan:  .Chronic bilateral low back pain without sciatica -     Urinalysis, Routine w reflex microscopic  Renal hypertension -     Ambulatory referral to Vascular Surgery  BPH with obstruction/lower urinary tract symptoms -     Ambulatory referral to Urology  Essential hypertension, benign Assessment & Plan: Workup for secondary causes  recommended given recent elevation reported with use of telmisartan. Continue metoprolol and amlodipine  .  Referring to [facility] vascular surgery group for renal artery doppler to rule out RAS    Proteinuria due to type 2 diabetes mellitus ([place]) Assessment & Plan: Counselled on diet. Advised to return to me or his endocrinologist in a few weeks to focus on diabetes management/       I provided 41 minutes of face-to-face time during this encounter reviewing patient's last visit with his endocrinologist, recent surgical and non surgical procedures, previous  labs and imaging studies, counseling on  diabetes management  and post visit ordering to diagnostics and therapeutics .   Follow-up: No follow-ups on file.   [name], MD

## 2022-04-16 NOTE — Assessment & Plan Note (Signed)
Aggravated by health concerns.  Reassruance given.  Ccontinue  paxil / wellbutrin.

## 2022-04-16 NOTE — Assessment & Plan Note (Signed)
He is tolerating minimal dose of rosuvastatin, but proteinuria is not currently addressed given need to rule out RAS.  Hyperglycemia also noted upon brief revie of CVG monitor data.   He will return for focused visit on glycemic control.  Lab Results  Component Value Date   HGBA1C 7.4 (A) 03/09/2022   Lab Results  Component Value Date   CHOL 118 03/05/2022   HDL 32 (L) 03/05/2022   LDLCALC 54 03/05/2022   LDLDIRECT 176.0 10/18/2018   TRIG 195 (H) 03/05/2022   CHOLHDL 3.7 03/05/2022

## 2022-04-23 ENCOUNTER — Other Ambulatory Visit: Payer: Self-pay

## 2022-04-23 ENCOUNTER — Encounter (HOSPITAL_COMMUNITY): Payer: BC Managed Care – PPO

## 2022-04-23 DIAGNOSIS — I701 Atherosclerosis of renal artery: Secondary | ICD-10-CM

## 2022-04-23 DIAGNOSIS — I150 Renovascular hypertension: Secondary | ICD-10-CM

## 2022-05-01 ENCOUNTER — Encounter: Payer: Self-pay | Admitting: Cardiology

## 2022-05-01 ENCOUNTER — Ambulatory Visit: Payer: BC Managed Care – PPO | Attending: Cardiology | Admitting: Cardiology

## 2022-05-01 VITALS — BP 118/74 | HR 75 | Ht 73.0 in | Wt 199.0 lb

## 2022-05-01 DIAGNOSIS — I25118 Atherosclerotic heart disease of native coronary artery with other forms of angina pectoris: Secondary | ICD-10-CM

## 2022-05-01 DIAGNOSIS — I10 Essential (primary) hypertension: Secondary | ICD-10-CM

## 2022-05-01 DIAGNOSIS — E782 Mixed hyperlipidemia: Secondary | ICD-10-CM | POA: Diagnosis not present

## 2022-05-01 NOTE — Patient Instructions (Addendum)
Medication Instructions:  STOP Brilinta   Labwork: None today  Testing/Procedures: None today  Follow-Up: 6 months  Any Other Special Instructions Will Be Listed Below (If Applicable).  If you need a refill on your cardiac medications before your next appointment, please call your pharmacy.

## 2022-05-01 NOTE — Progress Notes (Signed)
Clinical Summary [name] is a 52 y.o.male seen today for follow up of the following medical problems.   1.CAD - NSTEMI 12/2020 - 12/2020 cath: LAD LIs, D1 99%, LCX prox 85%, RCA prox to mid 85% - DES to D1, staged DES to RCA. Residual LCX disease managed medically 12/2020 echo:LVEF 65-70%  07/2021 nuclear stress: normal perfusion -intolerant to imdur  - chest pains at rest,  sharp pain left sided. 7-8/10 in severity. No other associated symptoms. Not positional. 1-2 times per month - compliant with meds - reports had stopped his brillinta at 1 year but had some chest pains and restarted taking.     2.HTN -compliant with meds  3. Hyperlipidemia - 02/2022 TC 118 TG 195 HDL 32 LDL 54   3. Prior DVT  4. Restrictive lung disease   Past Medical History:  Diagnosis Date   (HFpEF) heart failure with preserved ejection fraction    a. 12/2020 Echo: EF 65-70%, no rwma, mild LVH, nl RV fxn, triv MR.   CAD (coronary artery disease)    a. 12/2020 NSTEMI/PCI: LM nl, LAD min irregs, D1 99 (2.0x12 Onyx Frontier DES), LCX 85p (small), RCA 80p/m (4.0x18 Onyx Frontier DES - staged following day), 60d, RPDA mod dzs. EF 50-55%.   DVT (deep venous thrombosis) 04/2017   GERD (gastroesophageal reflux disease)    History of migraine    Hyperlipidemia    Hypertension    Long term current use of antithrombotics/antiplatelets    a.) on daily DAPT therapy (ticagrelor + ASA)   Non-STEMI (non-ST elevated myocardial infarction) 12/30/2020   Syncope and collapse    a.) single episode; related to dehydration   T2DM (type 2 diabetes mellitus)      No Known Allergies   Current Outpatient Medications  Medication Sig Dispense Refill   amLODipine (NORVASC) 10 MG tablet Take 1 tablet (10 mg total) by mouth daily. 90 tablet 1   aspirin 81 MG EC tablet Take 1 tablet (81 mg total) by mouth daily with breakfast. Swallow whole. 90 tablet 3   Continuous Blood Gluc Sensor (FREESTYLE LIBRE 3  SENSOR) MISC 1 Piece by Does not apply route every 14 (fourteen) days. Place 1 sensor on the skin every 14 days. Use to check glucose continuously 2 each 2   glipiZIDE (GLUCOTROL XL) 5 MG 24 hr tablet Take 1 tablet (5 mg total) by mouth daily with breakfast. 30 tablet 3   glucose blood (ONETOUCH VERIO) test strip Test sugars twice daily 50 each 6   metFORMIN (GLUCOPHAGE) 850 MG tablet TAKE 1 TABLET(850 MG) BY MOUTH TWICE DAILY WITH A MEAL 180 tablet 0   metoprolol tartrate (LOPRESSOR) 25 MG tablet TAKE 1 TABLET BY MOUTH TWICE DAILY 60 tablet 11   nitroGLYCERIN (NITROSTAT) 0.4 MG SL tablet Place 1 tablet (0.4 mg total) under the tongue every 5 (five) minutes as needed for chest pain. 25 tablet 1   ONETOUCH DELICA LANCETS 33G MISC Test sugars twice daily 50 each 3   pantoprazole (PROTONIX) 40 MG tablet Take 1 tablet (40 mg total) by mouth daily. TAKE 1 TABLET(40 MG) BY MOUTH TWICE DAILY 90 tablet 3   ranolazine (RANEXA) 500 MG 12 hr tablet Take 1 tablet (500 mg total) by mouth 2 (two) times daily. 180 tablet 3   rosuvastatin (CRESTOR) 20 MG tablet Take 1 tablet (20 mg total) by mouth daily. 90 tablet 1   ticagrelor (BRILINTA) 90 MG TABS tablet Take 1 tablet (90 mg  total) by mouth 2 (two) times daily. 60 tablet 11   vitamin B-12 (CYANOCOBALAMIN) 1000 MCG tablet Take 1,000 mcg by mouth daily.     albuterol (VENTOLIN HFA) 108 (90 Base) MCG/ACT inhaler Inhale 2 puffs into the lungs every 6 (six) hours as needed for wheezing or shortness of breath. (Patient not taking: Reported on 05/01/2022) 8 g 1   No current facility-administered medications for this visit.     Past Surgical History:  Procedure Laterality Date   CHOLECYSTECTOMY     COLONOSCOPY     COLONOSCOPY WITH PROPOFOL N/A 08/06/2017   Procedure: COLONOSCOPY WITH PROPOFOL;  Surgeon: Midge Minium, MD;  Location: [facility];  Service: Endoscopy;  Laterality: N/A;  Diabetic - oral meds   CORONARY STENT INTERVENTION N/A 12/31/2020    Procedure: CORONARY STENT INTERVENTION;  Surgeon: Lyn Records, MD;  Location: [facility] CV [facility];  Service: Cardiovascular;  Laterality: N/A;   CORONARY STENT INTERVENTION N/A 01/01/2021   Procedure: CORONARY STENT INTERVENTION;  Surgeon: Corky Crafts, MD;  Location: [facility] INVASIVE CV LAB;  Service: Cardiovascular;  Laterality: N/A;   ESOPHAGEAL DILATION  09/24/2016   Procedure: ESOPHAGEAL DILATION;  Surgeon: Midge Minium, MD;  Location: [facility];  Service: Gastroenterology;;   ESOPHAGOGASTRODUODENOSCOPY (EGD) WITH PROPOFOL N/A 04/08/2015   Procedure: ESOPHAGOGASTRODUODENOSCOPY (EGD) WITH PROPOFOL with dialation;  Surgeon: Midge Minium, MD;  Location: [facility];  Service: Endoscopy;  Laterality: N/A;  diabetic - oral meds   ESOPHAGOGASTRODUODENOSCOPY (EGD) WITH PROPOFOL N/A 09/24/2016   Procedure: ESOPHAGOGASTRODUODENOSCOPY (EGD) WITH PROPOFOL WITH DILATION;  Surgeon: Midge Minium, MD;  Location: [facility];  Service: Gastroenterology;  Laterality: N/A;  Diabetic - oral meds   FINGER AMPUTATION  01/27/2007   partial removal left index finger    LEFT HEART CATH AND CORONARY ANGIOGRAPHY N/A 12/31/2020   Procedure: LEFT HEART CATH AND CORONARY ANGIOGRAPHY;  Surgeon: Lyn Records, MD;  Location: [facility] INVAS[facility] CV LAB;  Service: Cardiovascular;  Laterality: N/A;   LEFT HEART CATH AND CORONARY ANGIOGRAPHY N/A 01/01/2021   Procedure: LEFT HEART CATH AND CORONARY ANGIOGRAPHY;  Surgeon: Corky Crafts, MD;  Location: [facility] INVASIVE CV LAB;  Service: Cardiovascular;  Laterality: N/A;   POLYPECTOMY  08/06/2017   Procedure: POLYPECTOMY INTESTINAL;  Surgeon: Midge Minium, MD;  Location: [facility];  Service: Endoscopy;;     No Known Allergies    Family History  Problem Relation Age of Onset   Hypertension Mother    Heart disease Father        CAD   Hypertension Father    Diabetes Father    Hypertension Sister    Hypertension Brother    Hypertension  Brother      Social History [name] reports that he quit smoking about 16 months ago. His smoking use included cigarettes. He has a 54.00 pack-year smoking history. He has never been exposed to tobacco smoke. He has never used smokeless tobacco. [name] reports no history of alcohol use.   Review of Systems CONSTITUTIONAL: No weight loss, fever, chills, weakness or fatigue.  HEENT: Eyes: No visual loss, blurred vision, double vision or yellow sclerae.No hearing loss, sneezing, congestion, runny nose or sore throat.  SKIN: No rash or itching.  CARDIOVASCULAR: per hpi RESPIRATORY: No shortness of breath, cough or sputum.  GASTROINTESTINAL: No anorexia, nausea, vomiting or diarrhea. No abdominal pain or blood.  GENITOURINARY: No burning on urination, no polyuria NEUROLOGICAL: No headache, dizziness, syncope, paralysis, ataxia, numbness or tingling in  the extremities. No change in bowel or bladder control.  MUSCULOSKELETAL: No muscle, back pain, joint pain or stiffness.  LYMPHATICS: No enlarged nodes. No history of splenectomy.  PSYCHIATRIC: No history of depression or anxiety.  ENDOCRINOLOGIC: No reports of sweating, cold or heat intolerance. No polyuria or polydipsia.  Marland Kitchen   Physical Examination There were no vitals filed for this visit. Filed Weights   05/01/22 1153  Weight: 199 lb (90.3 kg)    Gen: resting comfortably, no acute distress HEENT: no scleral icterus, pupils equal round and reactive, no palptable cervical adenopathy,  CV: RRR, no m/rg, no jvd Resp: Clear to auscultation bilaterally GI: abdomen is soft, non-tender, non-distended, normal bowel sounds, no hepatosplenomegaly MSK: extremities are warm, no edema.  Skin: warm, no rash Neuro:  no focal deficits Psych: appropriate affect   Diagnostic Studies  12/2020 echo 1. Left ventricular ejection fraction, by estimation, is 65 to 70%. The  left ventricle has normal function. The left ventricle has no regional   wall motion abnormalities. There is mild left ventricular hypertrophy.  Left ventricular diastolic parameters  were normal.   2. Right ventricular systolic function is normal. The right ventricular  size is normal. Tricuspid regurgitation signal is inadequate for assessing  PA pressure.   3. The mitral valve is normal in structure. Trivial mitral valve  regurgitation. No evidence of mitral stenosis.   4. The aortic valve is tricuspid. Aortic valve regurgitation is not  visualized. No aortic stenosis is present.   5. The inferior vena cava is normal in size with greater than 50%  respiratory variability, suggesting right atrial pressure of 3 mmHg.   12/2020 cath CONCLUSIONS: High-grade stenosis in the large mid diagonal Ulysse Siemen 99% reduced to 0% with TIMI-3 flow using a 2.0 x 12 Onyx frontier.  Chest discomfort was dissimilar to presenting complaint. Left main is normal LAD has mild diffuse disease Circumflex contains 80 to 90% proximal stenosis.  The vessel is diffusely diseased and gives origin to 2-2 small obtuse marginal branches.  If symptoms continue, perhaps this proximal lesion should be stented. RCA has a "rosary bead "appearance.  It is a large vessel.  There is 60% mid, 60% distal, diffuse disease in the PDA and LV branches. Left ventriculography demonstrated mid anterior wall hypokinesis.  EF 50%.  LVEDP is normal.   RECOMMENDATIONS:   Aggressive risk factor modification. Smoking cessation. Monitor for recurrent symptoms and consider PCI of circumflex if symptoms do not abate with PCI of this moderate-sized diagonal Merelin Human  12/2020 staged PCI  Prox Cx lesion is 85% stenosed.  This was a small vessel that was diffusely diseased.   Prox RCA to Mid RCA lesion is 80% stenosed.   A drug-eluting stent was successfully placed using a STENT ONYX FRONTIER 4.0X18, posdilated to 4.5 mm.   Post intervention, there is a 0% residual stenosis.   Dist RCA lesion is 50% stenosed.    Diagonal stent was widely patent.   LV end diastolic pressure is normal.   There is no aortic valve stenosis.   Successful PCI of RCA.  He needs aggressive secondary prevention, smoking cessation.  Would consider changing Plavix to Brilinta since he had ACS.     Assessment and Plan   1.CAD - atypical chest pains, recent nuclear stress test without ischemia  -continue current meds - would d/c brillinta, over 1 year since stents  2. HTN - at goal, continue current meds  3. Hyperlipidemia - LDL at goal, discussed dietary changes  and lifestyle changes to improve TGs and HDL  F/u 6 months     Antoine Poche, M.D.

## 2022-05-04 IMAGING — DX DG CHEST 2V
2 series · 2 of 2 positions shown · non-contrast
Comparison: 04/26/2019.

CLINICAL DATA: Cough.  Chest pain.

EXAM:
CHEST - 2 VIEW

[chest pa]
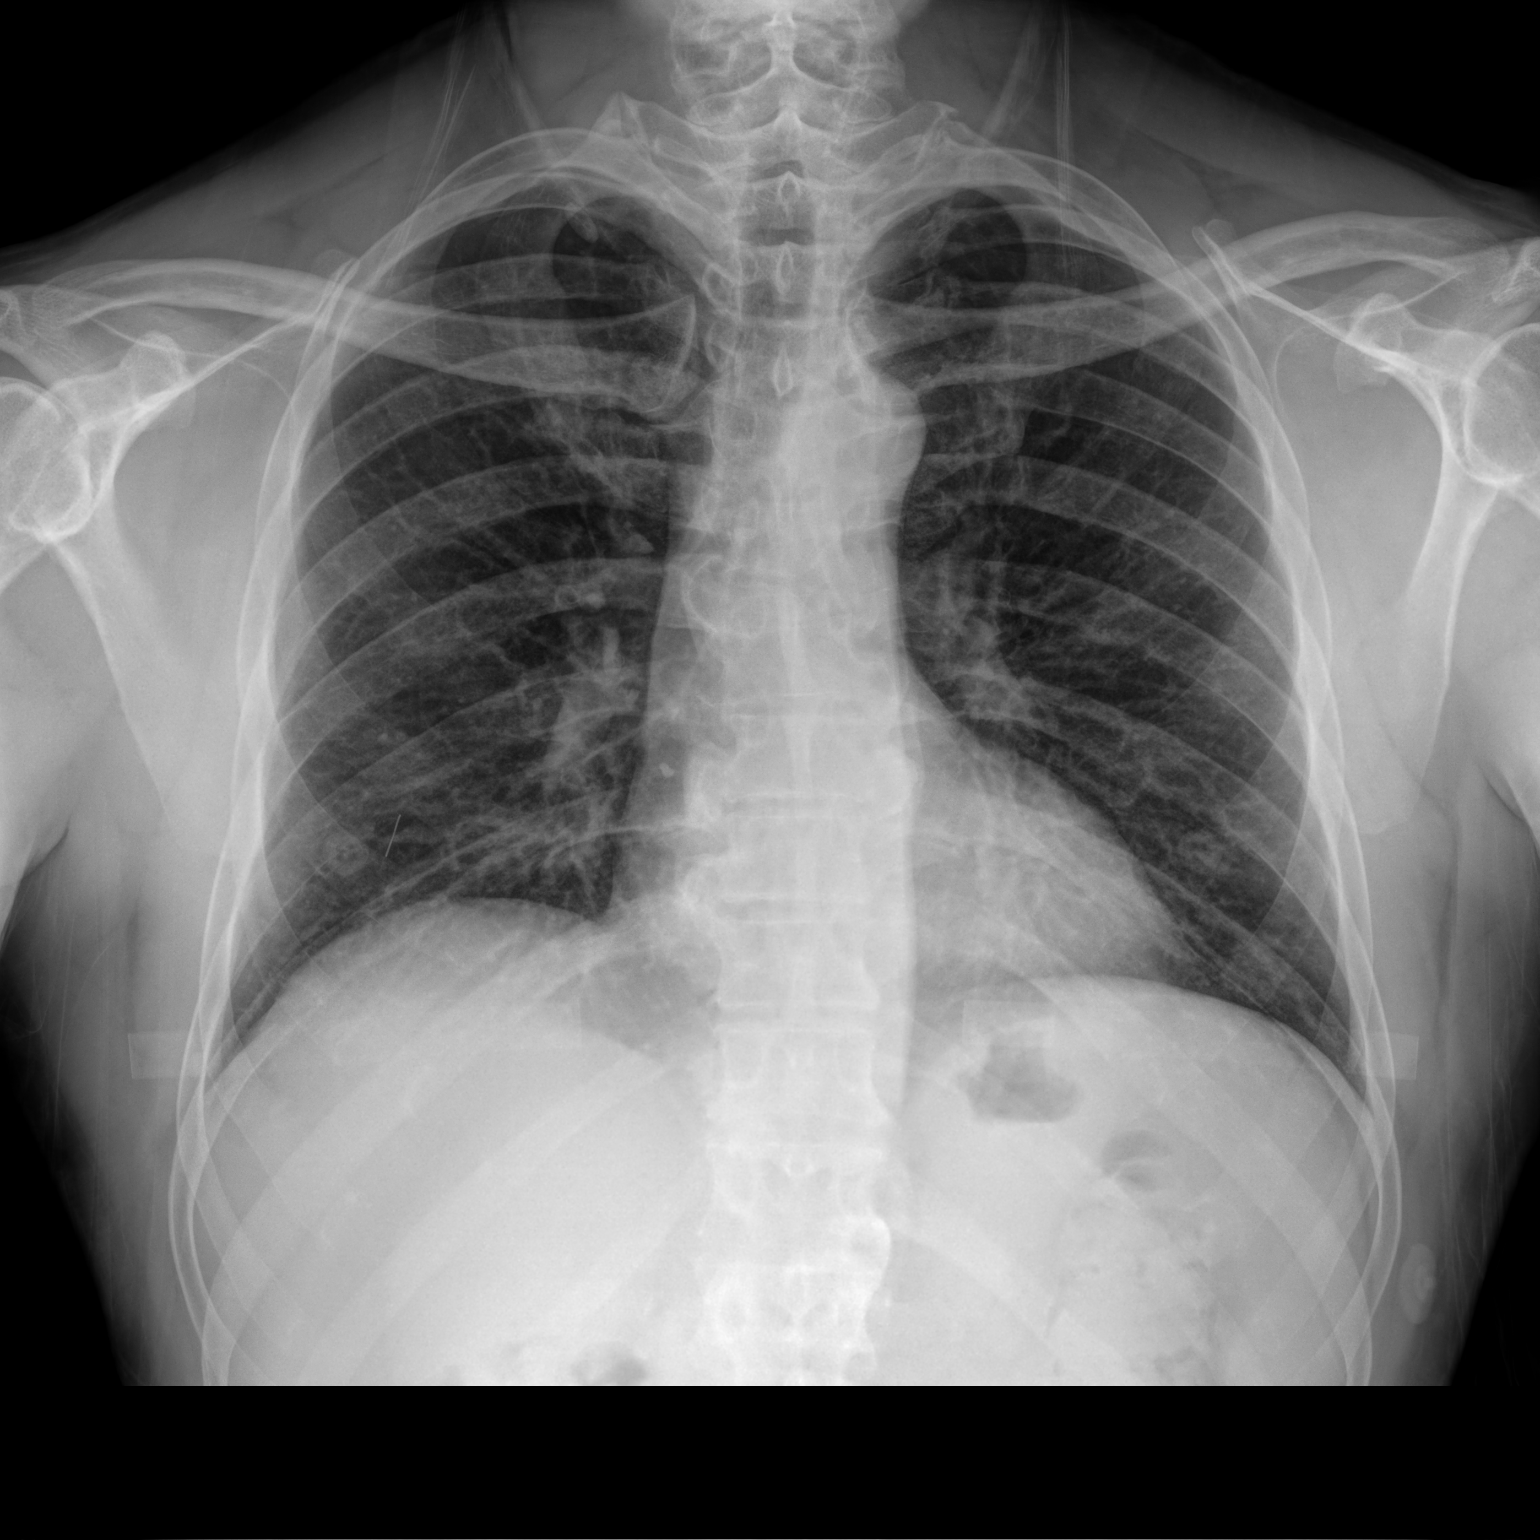

[chest lat]
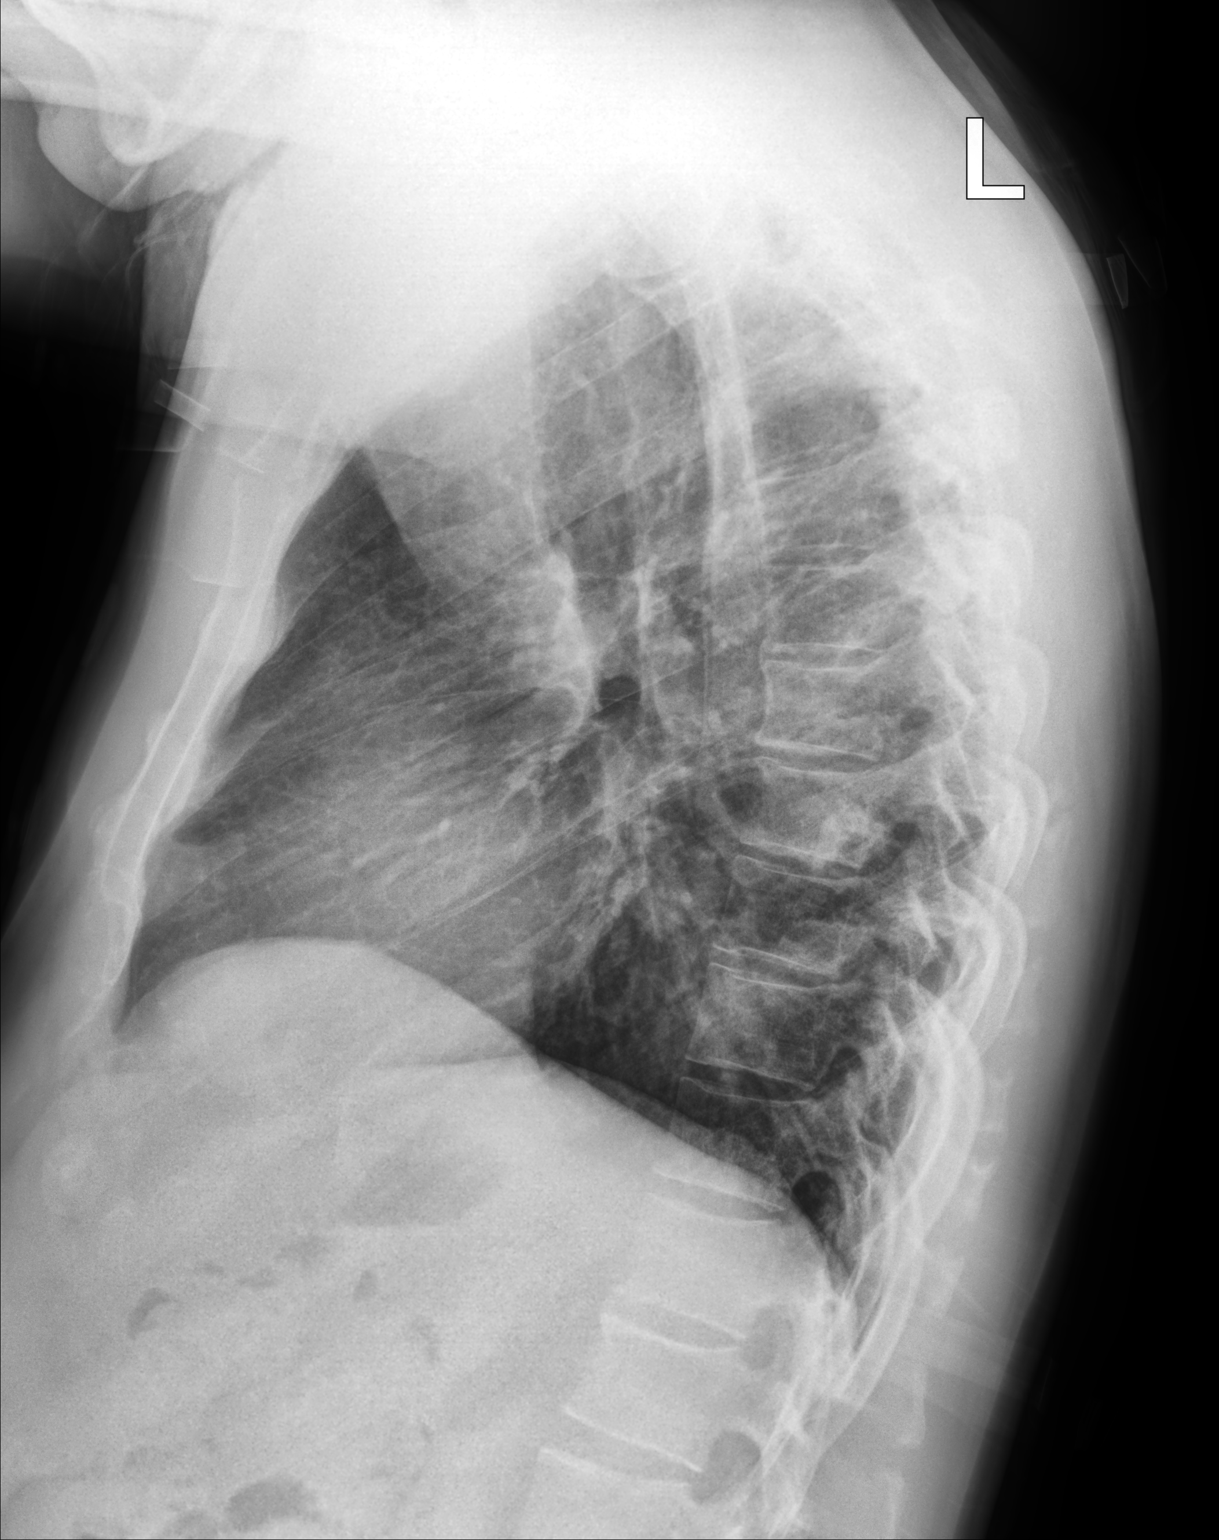

[2 of 2 positions shown; findings below may reference images not displayed]

FINDINGS: Mediastinum and hilar structures normal. Lungs are clear. No pleural
effusion or pneumothorax. Heart size normal. Degenerative change
thoracic spine.
IMPRESSION: No acute cardiopulmonary disease.

## 2022-05-11 ENCOUNTER — Ambulatory Visit (HOSPITAL_COMMUNITY)
Admission: RE | Admit: 2022-05-11 | Discharge: 2022-05-11 | Disposition: A | Discharge status: Home / Self Care | Payer: BC Managed Care – PPO | Source: Ambulatory Visit | Attending: Vascular Surgery | Admitting: Vascular Surgery

## 2022-05-11 DIAGNOSIS — I150 Renovascular hypertension: Secondary | ICD-10-CM

## 2022-05-11 DIAGNOSIS — I701 Atherosclerosis of renal artery: Secondary | ICD-10-CM | POA: Diagnosis not present

## 2022-05-20 ENCOUNTER — Encounter: Payer: Self-pay | Admitting: Vascular Surgery

## 2022-05-20 ENCOUNTER — Other Ambulatory Visit: Payer: Self-pay | Admitting: "Endocrinology

## 2022-05-20 ENCOUNTER — Ambulatory Visit: Payer: BC Managed Care – PPO | Admitting: Vascular Surgery

## 2022-05-20 ENCOUNTER — Encounter (HOSPITAL_COMMUNITY): Payer: BC Managed Care – PPO

## 2022-05-20 VITALS — BP 131/80 | HR 72 | Temp 97.3°F | Ht 73.0 in | Wt 203.0 lb

## 2022-05-20 DIAGNOSIS — E1165 Type 2 diabetes mellitus with hyperglycemia: Secondary | ICD-10-CM

## 2022-05-20 DIAGNOSIS — I10 Essential (primary) hypertension: Secondary | ICD-10-CM | POA: Diagnosis not present

## 2022-05-20 NOTE — Progress Notes (Signed)
[facility] [place]  Patient name: Jacob Baker MRN: [id number] DOB: Sep 22, 1970 Sex: male  REASON FOR CONSULT: Evaluate for possible renovascular hypertension and renal artery stenosis  HPI: Jacob Baker is a 52 y.o. male, who is here today for evaluation.  He is here today with his wife.  He does have somewhat of a unexpected blood pressure reaction on angiotensin-converting enzyme inhibitor for hypertension.  This raises the question of possible renovascular hypertension and he is here today for discussion.  He does not have any history of known renovascular disease.  He does have a history of prior non-STEMI in December 2022.  Past Medical History:  Diagnosis Date   (HFpEF) heart failure with preserved ejection fraction    a. 12/2020 Echo: EF 65-70%, no rwma, mild LVH, nl RV fxn, triv MR.   CAD (coronary artery disease)    a. 12/2020 NSTEMI/PCI: LM nl, LAD min irregs, D1 99 (2.0x12 Onyx Frontier DES), LCX 85p (small), RCA 80p/m (4.0x18 Onyx Frontier DES - staged following day), 60d, RPDA mod dzs. EF 50-55%.   DVT (deep venous thrombosis) 04/2017   GERD (gastroesophageal reflux disease)    History of migraine    Hyperlipidemia    Hypertension    Long term current use of antithrombotics/antiplatelets    a.) on daily DAPT therapy (ticagrelor + ASA)   Non-STEMI (non-ST elevated myocardial infarction) 12/30/2020   Syncope and collapse    a.) single episode; related to dehydration   T2DM (type 2 diabetes mellitus)     Family History  Problem Relation Age of Onset   Hypertension Mother    Heart disease Father        CAD   Hypertension Father    Diabetes Father    Hypertension Sister    Hypertension Brother    Hypertension Brother     SOCIAL HISTORY: Social History   Socioeconomic History   Marital status: Married    Spouse name: Carollee Herter   Number of children: 4   Years of education: Not on file   Highest  education level: Not on file  Occupational History   Not on file  Tobacco Use   Smoking status: Former    Packs/day: 2.00    Years: 27.00    Additional pack years: 0.00    Total pack years: 54.00    Types: Cigarettes    Quit date: 12/30/2020    Years since quitting: 1.3    Passive exposure: Never   Smokeless tobacco: Never  Vaping Use   Vaping Use: Never used  Substance and Sexual Activity   Alcohol use: No   Drug use: No   Sexual activity: Not on file  Other Topics Concern   Not on file  Social History Narrative   Not on file   Social Determinants of Health   Financial Resource Strain: Not on file  Food Insecurity: Not on file  Transportation Needs: Not on file  Physical Activity: Not on file  Stress: Not on file  Social Connections: Not on file  Intimate Partner Violence: Not on file    No Known Allergies  Current Outpatient Medications  Medication Sig Dispense Refill   albuterol (VENTOLIN HFA) 108 (90 Base) MCG/ACT inhaler Inhale 2 puffs into the lungs every 6 (six) hours as needed for wheezing or shortness of breath. 8 g 1   amLODipine (NORVASC) 10 MG tablet Take 1 tablet (10 mg total) by mouth daily. 90 tablet 1   aspirin  81 MG EC tablet Take 1 tablet (81 mg total) by mouth daily with breakfast. Swallow whole. 90 tablet 3   Continuous Blood Gluc Sensor (FREESTYLE LIBRE 3 SENSOR) MISC 1 Piece by Does not apply route every 14 (fourteen) days. Place 1 sensor on the skin every 14 days. Use to check glucose continuously 2 each 2   glipiZIDE (GLUCOTROL XL) 5 MG 24 hr tablet Take 1 tablet (5 mg total) by mouth daily with breakfast. 30 tablet 3   glucose blood (ONETOUCH VERIO) test strip Test sugars twice daily 50 each 6   metFORMIN (GLUCOPHAGE) 850 MG tablet TAKE 1 TABLET(850 MG) BY MOUTH TWICE DAILY WITH A MEAL 180 tablet 0   metoprolol tartrate (LOPRESSOR) 25 MG tablet TAKE 1 TABLET BY MOUTH TWICE DAILY 60 tablet 11   nitroGLYCERIN (NITROSTAT) 0.4 MG SL tablet Place 1  tablet (0.4 mg total) under the tongue every 5 (five) minutes as needed for chest pain. 25 tablet 1   ONETOUCH DELICA LANCETS 33G MISC Test sugars twice daily 50 each 3   pantoprazole (PROTONIX) 40 MG tablet Take 1 tablet (40 mg total) by mouth daily. TAKE 1 TABLET(40 MG) BY MOUTH TWICE DAILY 90 tablet 3   ranolazine (RANEXA) 500 MG 12 hr tablet Take 1 tablet (500 mg total) by mouth 2 (two) times daily. 180 tablet 3   rosuvastatin (CRESTOR) 20 MG tablet Take 1 tablet (20 mg total) by mouth daily. 90 tablet 1   vitamin B-12 (CYANOCOBALAMIN) 1000 MCG tablet Take 1,000 mcg by mouth daily.     No current facility-administered medications for this visit.    REVIEW OF SYSTEMS:   denotes positive finding,  denotes negative finding Cardiac  Comments:  Chest pain or chest pressure: x   Shortness of breath upon exertion:    Short of breath when lying flat: x   Irregular heart rhythm:        Vascular    Pain in calf, thigh, or hip brought on by ambulation:    Pain in feet at night that wakes you up from your sleep:     Blood clot in your veins:    Leg swelling:  x       Pulmonary    Oxygen at home:    Productive cough:     Wheezing:         Neurologic    Sudden weakness in arms or legs:     Sudden numbness in arms or legs:     Sudden onset of difficulty speaking or slurred speech:    Temporary loss of vision in one eye:     Problems with dizziness:         Gastrointestinal    Blood in stool:     Vomited blood:         Genitourinary    Burning when urinating:     Blood in urine:        Psychiatric    Major depression:         Hematologic    Bleeding problems:    Problems with blood clotting too easily:        Skin    Rashes or ulcers:        Constitutional    Fever or chills:      PHYSICAL EXAM: Vitals:   05/20/22 1123  BP: 131/80  Pulse: 72  Temp: (!) 97.3 F (36.3 C)  SpO2: 98%  Weight: 203 lb (92.1 kg)  Height: 6'  1" (1.854 m)    GENERAL: The patient  is a well-nourished male, in no acute distress. The vital signs are documented above. CARDIOVASCULAR: 2+ radial 2+ dorsalis pedis pulses bilaterally.  I do not appreciate an abdominal bruit. PULMONARY: There is good air exchange  MUSCULOSKELETAL: There are no major deformities or cyanosis. NEUROLOGIC: No focal weakness or paresthesias are detected. SKIN: There are no ulcers or rashes noted. PSYCHIATRIC: The patient has a normal affect.  DATA:  Patient underwent renal artery duplex on 05/11/2022.  This showed normal kidney size bilaterally and no evidence of renal artery stenosis.    He had a prior scan on 01/07/2021 to rule out aortic syndrome.  This was chest abdomen and pelvis.  In reviewing this study, both renal arteries are large size and are widely patent with no evidence of stenosis   MEDICAL ISSUES: I discussed this at length with the patient and his wife.  He does not have any evidence of renovascular disease to account for his hypertension.  I would not recommend any further workup.  He will see [place] again on an as-needed basis   Larina Earthly, MD [facility] Tel [phone] Pager [phone]  Note: Portions of this report may have been transcribed using voice recognition software.  Every effort has been made to ensure accuracy; however, inadvertent computerized transcription errors may still be present.

## 2022-05-28 ENCOUNTER — Other Ambulatory Visit: Payer: Self-pay | Admitting: "Endocrinology

## 2022-06-03 ENCOUNTER — Ambulatory Visit: Payer: BC Managed Care – PPO | Admitting: Urology

## 2022-06-03 ENCOUNTER — Encounter: Payer: Self-pay | Admitting: Urology

## 2022-06-03 VITALS — BP 113/76 | HR 76

## 2022-06-03 DIAGNOSIS — R3916 Straining to void: Secondary | ICD-10-CM | POA: Diagnosis not present

## 2022-06-03 DIAGNOSIS — R351 Nocturia: Secondary | ICD-10-CM

## 2022-06-03 DIAGNOSIS — N401 Enlarged prostate with lower urinary tract symptoms: Secondary | ICD-10-CM

## 2022-06-03 LAB — URINALYSIS, ROUTINE W REFLEX MICROSCOPIC
Bilirubin, UA: NEGATIVE
Glucose, UA: NEGATIVE
Ketones, UA: NEGATIVE
Leukocytes,UA: NEGATIVE
Nitrite, UA: NEGATIVE
RBC, UA: NEGATIVE
Specific Gravity, UA: 1.02 (ref 1.005–1.030)
Urobilinogen, Ur: 1 mg/dL (ref 0.2–1.0)
pH, UA: 5.5 (ref 5.0–7.5)

## 2022-06-03 LAB — BLADDER SCAN AMB NON-IMAGING: Scan Result: 13

## 2022-06-03 MED ORDER — ALFUZOSIN HCL ER 10 MG PO TB24: 10.0000 mg | ORAL_TABLET | Freq: Every evening | ORAL | 11 refills | Status: DC

## 2022-06-03 NOTE — Progress Notes (Signed)
06/03/2022 2:33 PM   DEQUANN SCHLEETER 1970/07/05 [id number]  Referring provider: [name], MD [address] [place],  [place] [address]  Difficulty urinating   HPI: Mr Jacob Baker is a 52yo here for evaluation of difficulty urinating. For the past 6 months he has noted lower back pain which is associated with the urge to urinate. He has nocturia 1-2x. Urine stream strong. IPSS 10 QOL 4. He has urinary frequency every 6-8 hours. He has a feeling of incomplete emptying in the morning where he has to double void.    PMH: Past Medical History:  Diagnosis Date   (HFpEF) heart failure with preserved ejection fraction (HCC)    a. 12/2020 Echo: EF 65-70%, no rwma, mild LVH, nl RV fxn, triv MR.   CAD (coronary artery disease)    a. 12/2020 NSTEMI/PCI: LM nl, LAD min irregs, D1 99 (2.0x12 Onyx Frontier DES), LCX 85p (small), RCA 80p/m (4.0x18 Onyx Frontier DES - staged following day), 60d, RPDA mod dzs. EF 50-55%.   DVT (deep venous thrombosis) (HCC) 04/2017   GERD (gastroesophageal reflux disease)    History of migraine    Hyperlipidemia    Hypertension    Long term current use of antithrombotics/antiplatelets    a.) on daily DAPT therapy (ticagrelor + ASA)   Non-STEMI (non-ST elevated myocardial infarction) (HCC) 12/30/2020   Syncope and collapse    a.) single episode; related to dehydration   T2DM (type 2 diabetes mellitus) (HCC)     Surgical History: Past Surgical History:  Procedure Laterality Date   CHOLECYSTECTOMY     COLONOSCOPY     COLONOSCOPY WITH PROPOFOL N/A 08/06/2017   Procedure: COLONOSCOPY WITH PROPOFOL;  Surgeon: Midge Minium, MD;  Location: [facility];  Service: Endoscopy;  Laterality: N/A;  Diabetic - oral meds   CORONARY STENT INTERVENTION N/A 12/31/2020   Procedure: CORONARY STENT INTERVENTION;  Surgeon: Lyn Records, MD;  Location: [facility];  Service: Cardiovascular;  Laterality: N/A;   CORONARY STENT INTERVENTION N/A  01/01/2021   Procedure: CORONARY STENT INTERVENTION;  Surgeon: Corky Crafts, MD;  Location: [facility] INVASIVE CV LAB;  Service: Cardiovascular;  Laterality: N/A;   ESOPHAGEAL DILATION  09/24/2016   Procedure: ESOPHAGEAL DILATION;  Surgeon: Midge Minium, MD;  Location: [facility];  Service: Gastroenterology;;   ESOPHAGOGASTRODUODENOSCOPY (EGD) WITH PROPOFOL N/A 04/08/2015   Procedure: ESOPHAGOGASTRODUODENOSCOPY (EGD) WITH PROPOFOL with dialation;  Surgeon: Midge Minium, MD;  Location: [facility];  Service: Endoscopy;  Laterality: N/A;  diabetic - oral meds   ESOPHAGOGASTRODUODENOSCOPY (EGD) WITH PROPOFOL N/A 09/24/2016   Procedure: ESOPHAGOGASTRODUODENOSCOPY (EGD) WITH PROPOFOL WITH DILATION;  Surgeon: Midge Minium, MD;  Location: [facility];  Service: Gastroenterology;  Laterality: N/A;  Diabetic - oral meds   FINGER AMPUTATION  01/27/2007   partial removal left index finger    LEFT HEART CATH AND CORONARY ANGIOGRAPHY N/A 12/31/2020   Procedure: LEFT HEART CATH AND CORONARY ANGIOGRAPHY;  Surgeon: Lyn Records, MD;  Location: [facility];  Service: Cardiovascular;  Laterality: N/A;   LEFT HEART CATH AND CORONARY ANGIOGRAPHY N/A 01/01/2021   Procedure: LEFT HEART CATH AND CORONARY ANGIOGRAPHY;  Surgeon: Corky Crafts, MD;  Location: [facility] INVASIVE CV LAB;  Service: Cardiovascular;  Laterality: N/A;   POLYPECTOMY  08/06/2017   Procedure: POLYPECTOMY INTESTINAL;  Surgeon: Midge Minium, MD;  Location: [facility];  Service: Endoscopy;;    Home Medications:  Allergies as of 06/03/2022   No Known  Allergies      Medication List        Accurate as of Jun 03, 2022  2:33 PM. If you have any questions, ask your nurse or doctor.          albuterol 108 (90 Base) MCG/ACT inhaler Commonly known as: VENTOLIN HFA Inhale 2 puffs into the lungs every 6 (six) hours as needed for wheezing or shortness of breath.   amLODipine 10 MG tablet Commonly known  as: NORVASC Take 1 tablet (10 mg total) by mouth daily.   aspirin EC 81 MG tablet Take 1 tablet (81 mg total) by mouth daily with breakfast. Swallow whole.   cyanocobalamin 1000 MCG tablet Commonly known as: VITAMIN B12 Take 1,000 mcg by mouth daily.   FreeStyle Libre 3 Sensor Misc PLACE 1 SENSOR ON THE SKIN EVERY 14 DAYS. USE TO CHECK GLUCOSE CONTINUOUSLY   glipiZIDE 5 MG 24 hr tablet Commonly known as: GLUCOTROL XL Take 1 tablet (5 mg total) by mouth daily with breakfast.   glucose blood test strip Commonly known as: OneTouch Verio Test sugars twice daily   metFORMIN 850 MG tablet Commonly known as: GLUCOPHAGE TAKE 1 TABLET(850 MG) BY MOUTH TWICE DAILY WITH A MEAL   metoprolol tartrate 25 MG tablet Commonly known as: LOPRESSOR TAKE 1 TABLET BY MOUTH TWICE DAILY   nitroGLYCERIN 0.4 MG SL tablet Commonly known as: Nitrostat Place 1 tablet (0.4 mg total) under the tongue every 5 (five) minutes as needed for chest pain.   OneTouch Delica Lancets 33G Misc Test sugars twice daily   pantoprazole 40 MG tablet Commonly known as: PROTONIX Take 1 tablet (40 mg total) by mouth daily. TAKE 1 TABLET(40 MG) BY MOUTH TWICE DAILY   ranolazine 500 MG 12 hr tablet Commonly known as: Ranexa Take 1 tablet (500 mg total) by mouth 2 (two) times daily.   rosuvastatin 20 MG tablet Commonly known as: CRESTOR Take 1 tablet (20 mg total) by mouth daily.        Allergies: No Known Allergies  Family History: Family History  Problem Relation Age of Onset   Hypertension Mother    Heart disease Father        CAD   Hypertension Father    Diabetes Father    Hypertension Sister    Hypertension Brother    Hypertension Brother     Social History:  reports that he quit smoking about 17 months ago. His smoking use included cigarettes. He has a 54.00 pack-year smoking history. He has never been exposed to tobacco smoke. He has never used smokeless tobacco. He reports that he does not  drink alcohol and does not use drugs.  ROS: All other review of systems were reviewed and are negative except what is noted above in HPI  Physical Exam: BP 113/76   Pulse 76   Constitutional:  Alert and oriented, No acute distress. HEENT: [place] AT, moist mucus membranes.  Trachea midline, no masses. Cardiovascular: No clubbing, cyanosis, or edema. Respiratory: Normal respiratory effort, no increased work of breathing. GI: Abdomen is soft, nontender, nondistended, no abdominal masses GU: No CVA tenderness. Lymph: No cervical or inguinal lymphadenopathy. Skin: No rashes, bruises or suspicious lesions. Neurologic: Grossly intact, no focal deficits, moving all 4 extremities. Psychiatric: Normal mood and affect.  Laboratory Data: Lab Results  Component Value Date   WBC 6.5 08/18/2021   HGB 10.6 (L) 08/18/2021   HCT 30.2 (L) 08/18/2021   MCV 81.2 08/18/2021   PLT 250.0 08/18/2021  Lab Results  Component Value Date   CREATININE 1.05 03/05/2022    Lab Results  Component Value Date   PSA 0.45 10/23/2021   PSA 0.72 10/21/2020   PSA 0.67 10/20/2019    Lab Results  Component Value Date   TESTOSTERONE 182 (L) 08/21/2020    Lab Results  Component Value Date   HGBA1C 7.4 (A) 03/09/2022    Urinalysis    Component Value Date/Time   COLORURINE YELLOW 04/14/2022 1640   APPEARANCEUR CLEAR 04/14/2022 1640   APPEARANCEUR Clear 10/04/2013 2025   LABSPEC 1.020 04/14/2022 1640   LABSPEC 1.021 10/04/2013 2025   PHURINE 5.5 04/14/2022 1640   GLUCOSEU NEGATIVE 04/14/2022 1640   HGBUR NEGATIVE 04/14/2022 1640   BILIRUBINUR NEGATIVE 04/14/2022 1640   BILIRUBINUR Negative 10/04/2013 2025   KETONESUR NEGATIVE 04/14/2022 1640   PROTEINUR 30 mg/dL 78/29/5621 3086   UROBILINOGEN 0.2 04/14/2022 1640   NITRITE NEGATIVE 04/14/2022 1640   LEUKOCYTESUR NEGATIVE 04/14/2022 1640   LEUKOCYTESUR Negative 10/04/2013 2025    Lab Results  Component Value Date   LABMICR 16.9 08/21/2020    BACTERIA NONE SEEN 10/04/2013    Pertinent Imaging:  No results found for this or any previous visit.  No results found for this or any previous visit.  No results found for this or any previous visit.  No results found for this or any previous visit.  No results found for this or any previous visit.  No valid procedures specified. No results found for this or any previous visit.  No results found for this or any previous visit.   Assessment & Plan:    1. Benign prostatic hyperplasia (BPH) with straining on urination -we will trial uroxatral 10mg  qhs - Urinalysis, Routine w reflex microscopic - BLADDER SCAN AMB NON-IMAGING  2. Nocturia -uroxatral 10mg  qhs   No follow-ups on file.  Wilkie Aye, MD  [facility] [place]

## 2022-06-03 NOTE — Patient Instructions (Signed)

## 2022-06-03 NOTE — Progress Notes (Signed)
post void residual=13 

## 2022-06-04 ENCOUNTER — Other Ambulatory Visit: Payer: Self-pay | Admitting: Internal Medicine

## 2022-06-08 ENCOUNTER — Encounter: Payer: Self-pay | Admitting: Cardiology

## 2022-06-08 ENCOUNTER — Encounter: Payer: Self-pay | Admitting: Urology

## 2022-06-16 ENCOUNTER — Ambulatory Visit: Payer: BC Managed Care – PPO | Admitting: "Endocrinology

## 2022-06-23 NOTE — Telephone Encounter (Signed)
I have reviewed two medical resources and don't finding any concerns about being on both medications. Ok from a cardiac standpoint  Dominga [name] MD

## 2022-06-25 ENCOUNTER — Ambulatory Visit: Payer: BC Managed Care – PPO | Admitting: "Endocrinology

## 2022-06-25 ENCOUNTER — Encounter: Payer: Self-pay | Admitting: "Endocrinology

## 2022-06-25 VITALS — BP 122/66 | HR 72 | Ht 73.0 in | Wt 209.6 lb

## 2022-06-25 DIAGNOSIS — Z87891 Personal history of nicotine dependence: Secondary | ICD-10-CM

## 2022-06-25 DIAGNOSIS — I10 Essential (primary) hypertension: Secondary | ICD-10-CM | POA: Diagnosis not present

## 2022-06-25 DIAGNOSIS — E782 Mixed hyperlipidemia: Secondary | ICD-10-CM

## 2022-06-25 DIAGNOSIS — E1159 Type 2 diabetes mellitus with other circulatory complications: Secondary | ICD-10-CM

## 2022-06-25 DIAGNOSIS — Z7984 Long term (current) use of oral hypoglycemic drugs: Secondary | ICD-10-CM | POA: Diagnosis not present

## 2022-06-25 LAB — POCT GLYCOSYLATED HEMOGLOBIN (HGB A1C): HbA1c, POC (controlled diabetic range): 6.5 % (ref 0.0–7.0)

## 2022-06-25 MED ORDER — GLIPIZIDE ER 5 MG PO TB24: 5.0000 mg | ORAL_TABLET | Freq: Every day | ORAL | 1 refills | Status: DC

## 2022-06-25 MED ORDER — FREESTYLE LIBRE 3 SENSOR MISC: 1 refills | Status: DC

## 2022-06-25 MED ORDER — METFORMIN HCL 850 MG PO TABS
850.0000 mg | ORAL_TABLET | Freq: Two times a day (BID) | ORAL | 1 refills | Status: DC
Start: 2022-06-25 — End: 2022-08-18

## 2022-06-25 NOTE — Progress Notes (Signed)
06/25/2022, 3:45 PM   Endocrinology follow-up note  Subjective:    Patient ID: Jacob Baker, male    DOB: 23-Feb-1970.  Vella Redhead is being seen in follow-up after he was seen in consultation for management of currently uncontrolled symptomatic diabetes requested by  [name], MD.   Past Medical History:  Diagnosis Date   (HFpEF) heart failure with preserved ejection fraction (HCC)    a. 12/2020 Echo: EF 65-70%, no rwma, mild LVH, nl RV fxn, triv MR.   CAD (coronary artery disease)    a. 12/2020 NSTEMI/PCI: LM nl, LAD min irregs, D1 99 (2.0x12 Onyx Frontier DES), LCX 85p (small), RCA 80p/m (4.0x18 Onyx Frontier DES - staged following day), 60d, RPDA mod dzs. EF 50-55%.   DVT (deep venous thrombosis) (HCC) 04/2017   GERD (gastroesophageal reflux disease)    History of migraine    Hyperlipidemia    Hypertension    Long term current use of antithrombotics/antiplatelets    a.) on daily DAPT therapy (ticagrelor + ASA)   Non-STEMI (non-ST elevated myocardial infarction) (HCC) 12/30/2020   Syncope and collapse    a.) single episode; related to dehydration   T2DM (type 2 diabetes mellitus) (HCC)     Past Surgical History:  Procedure Laterality Date   CHOLECYSTECTOMY     COLONOSCOPY     COLONOSCOPY WITH PROPOFOL N/A 08/06/2017   Procedure: COLONOSCOPY WITH PROPOFOL;  Surgeon: Midge Minium, MD;  Location: [facility];  Service: Endoscopy;  Laterality: N/A;  Diabetic - oral meds   CORONARY STENT INTERVENTION N/A 12/31/2020   Procedure: CORONARY STENT INTERVENTION;  Surgeon: Lyn Records, MD;  Location: [facility] CV [facility];  Service: Cardiovascular;  Laterality: N/A;   CORONARY STENT INTERVENTION N/A 01/01/2021   Procedure: CORONARY STENT INTERVENTION;  Surgeon: Corky Crafts, MD;  Location: [facility] INVASIVE CV LAB;  Service: Cardiovascular;  Laterality: N/A;   ESOPHAGEAL DILATION   09/24/2016   Procedure: ESOPHAGEAL DILATION;  Surgeon: Midge Minium, MD;  Location: [facility];  Service: Gastroenterology;;   ESOPHAGOGASTRODUODENOSCOPY (EGD) WITH PROPOFOL N/A 04/08/2015   Procedure: ESOPHAGOGASTRODUODENOSCOPY (EGD) WITH PROPOFOL with dialation;  Surgeon: Midge Minium, MD;  Location: [facility];  Service: Endoscopy;  Laterality: N/A;  diabetic - oral meds   ESOPHAGOGASTRODUODENOSCOPY (EGD) WITH PROPOFOL N/A 09/24/2016   Procedure: ESOPHAGOGASTRODUODENOSCOPY (EGD) WITH PROPOFOL WITH DILATION;  Surgeon: Midge Minium, MD;  Location: [facility];  Service: Gastroenterology;  Laterality: N/A;  Diabetic - oral meds   FINGER AMPUTATION  01/27/2007   partial removal left index finger    LEFT HEART CATH AND CORONARY ANGIOGRAPHY N/A 12/31/2020   Procedure: LEFT HEART CATH AND CORONARY ANGIOGRAPHY;  Surgeon: Lyn Records, MD;  Location: [facility] INVAS[facility] CV LAB;  Service: Cardiovascular;  Laterality: N/A;   LEFT HEART CATH AND CORONARY ANGIOGRAPHY N/A 01/01/2021   Procedure: LEFT HEART CATH AND CORONARY ANGIOGRAPHY;  Surgeon: Corky Crafts, MD;  Location: [facility] CV LAB;  Service: Cardiovascular;  Laterality: N/A;   POLYPECTOMY  08/06/2017   Procedure: POLYPECTOMY INTESTINAL;  Surgeon: Midge Minium, MD;  Location: [facility];  Service: Endoscopy;;  Social History   Socioeconomic History   Marital status: Married    Spouse name: Carollee Herter   Number of children: 4   Years of education: Not on file   Highest education level: Not on file  Occupational History   Not on file  Tobacco Use   Smoking status: Former    Packs/day: 2.00    Years: 27.00    Additional pack years: 0.00    Total pack years: 54.00    Types: Cigarettes    Quit date: 12/30/2020    Years since quitting: 1.4    Passive exposure: Never   Smokeless tobacco: Never  Vaping Use   Vaping Use: Never used  Substance and Sexual Activity   Alcohol use: No   Drug use: No    Sexual activity: Not on file  Other Topics Concern   Not on file  Social History Narrative   Not on file   Social Determinants of Health   Financial Resource Strain: Not on file  Food Insecurity: Not on file  Transportation Needs: Not on file  Physical Activity: Not on file  Stress: Not on file  Social Connections: Not on file    Family History  Problem Relation Age of Onset   Hypertension Mother    Heart disease Father        CAD   Hypertension Father    Diabetes Father    Hypertension Sister    Hypertension Brother    Hypertension Brother     Outpatient Encounter Medications as of 06/25/2022  Medication Sig   albuterol (VENTOLIN HFA) 108 (90 Base) MCG/ACT inhaler Inhale 2 puffs into the lungs every 6 (six) hours as needed for wheezing or shortness of breath.   alfuzosin (UROXATRAL) 10 MG 24 hr tablet Take 1 tablet (10 mg total) by mouth at bedtime.   amLODipine (NORVASC) 10 MG tablet TAKE 1 TABLET(10 MG) BY MOUTH DAILY   aspirin 81 MG EC tablet Take 1 tablet (81 mg total) by mouth daily with breakfast. Swallow whole.   Continuous Glucose Sensor (FREESTYLE LIBRE 3 SENSOR) MISC PLACE 1 SENSOR ON THE SKIN EVERY 14 DAYS. USE TO CHECK GLUCOSE CONTINUOUSLY   glipiZIDE (GLUCOTROL XL) 5 MG 24 hr tablet Take 1 tablet (5 mg total) by mouth daily with breakfast.   glucose blood (ONETOUCH VERIO) test strip Test sugars twice daily   metFORMIN (GLUCOPHAGE) 850 MG tablet Take 1 tablet (850 mg total) by mouth 2 (two) times daily with a meal.   metoprolol tartrate (LOPRESSOR) 25 MG tablet TAKE 1 TABLET BY MOUTH TWICE DAILY   nitroGLYCERIN (NITROSTAT) 0.4 MG SL tablet Place 1 tablet (0.4 mg total) under the tongue every 5 (five) minutes as needed for chest pain.   ONETOUCH DELICA LANCETS 33G MISC Test sugars twice daily   pantoprazole (PROTONIX) 40 MG tablet Take 1 tablet (40 mg total) by mouth daily. TAKE 1 TABLET(40 MG) BY MOUTH TWICE DAILY   ranolazine (RANEXA) 500 MG 12 hr tablet Take 1  tablet (500 mg total) by mouth 2 (two) times daily.   rosuvastatin (CRESTOR) 20 MG tablet Take 1 tablet (20 mg total) by mouth daily.   vitamin B-12 (CYANOCOBALAMIN) 1000 MCG tablet Take 1,000 mcg by mouth daily.   [DISCONTINUED] Continuous Glucose Sensor (FREESTYLE LIBRE 3 SENSOR) MISC PLACE 1 SENSOR ON THE SKIN EVERY 14 DAYS. USE TO CHECK GLUCOSE CONTINUOUSLY   [DISCONTINUED] glipiZIDE (GLUCOTROL XL) 5 MG 24 hr tablet Take 1 tablet (5 mg total) by mouth daily  with breakfast.   [DISCONTINUED] metFORMIN (GLUCOPHAGE) 850 MG tablet TAKE 1 TABLET(850 MG) BY MOUTH TWICE DAILY WITH A MEAL   No facility-administered encounter medications on file as of 06/25/2022.    ALLERGIES: No Known Allergies  VACCINATION STATUS: Immunization History  Administered Date(s) Administered   Influenza Split 12/15/2013   Influenza,inj,Quad PF,6+ Mos 10/26/2014, 10/14/2015, 10/13/2017, 10/18/2018, 10/20/2019, 10/21/2020, 10/21/2021   Influenza-Unspecified 09/26/2016   Moderna Sars-Covid-2 Vaccination 04/28/2019, 05/30/2019   PNEUMOCOCCAL CONJUGATE-20 10/21/2021   Pneumococcal Polysaccharide-23 10/14/2015   Tdap 10/26/2014    Diabetes He presents for his follow-up diabetic visit. He has type 2 diabetes mellitus. His disease course has been improving. There are no hypoglycemic associated symptoms. Pertinent negatives for hypoglycemia include no confusion, headaches, pallor or seizures. Pertinent negatives for diabetes include no chest pain, no fatigue, no polydipsia, no polyphagia, no polyuria and no weakness. Symptoms are improving. Diabetic complications include heart disease. (In the interim , he developed chest pain, hospitalized and diagnosed with ST Elevation MI. He is s/p stent placement.) Risk factors for coronary artery disease include dyslipidemia, diabetes mellitus, male sex, family history and tobacco exposure. Current diabetic treatment includes oral agent (triple therapy). His weight is increasing  steadily. He is following a generally unhealthy diet. When asked about meal planning, he reported none. He has not had a previous visit with a dietitian. He rarely participates in exercise. His home blood glucose trend is decreasing steadily. His breakfast blood glucose range is generally 140-180 mg/dl. His bedtime blood glucose range is generally 140-180 mg/dl. His overall blood glucose range is 140-180 mg/dl. (He uses freestyle libre device.  AGP report shows 39% time in range, 57% slightly above range-level 1 hyperglycemia.  His point-of-care A1c is 6.5% improving from 7.4%.  He does not have hypoglycemia.     )  Hyperlipidemia This is a chronic problem. The current episode started more than 1 year ago. The problem is uncontrolled. Exacerbating diseases include diabetes. Pertinent negatives include no chest pain, myalgias or shortness of breath. Current antihyperlipidemic treatment includes statins. Risk factors for coronary artery disease include diabetes mellitus, dyslipidemia, male sex and family history.  Hypertension This is a chronic problem. The current episode started more than 1 year ago. The problem is controlled. Pertinent negatives include no chest pain, headaches, neck pain, palpitations or shortness of breath. Risk factors for coronary artery disease include family history, dyslipidemia, diabetes mellitus, male gender and smoking/tobacco exposure. Past treatments include calcium channel blockers. Hypertensive end-organ damage includes CAD/MI.    Objective:    BP 122/66   Pulse 72   Ht 6\' 1"  (1.854 m)   Wt 209 lb 9.6 oz (95.1 kg)   BMI 27.65 kg/m   Wt Readings from Last 3 Encounters:  06/25/22 209 lb 9.6 oz (95.1 kg)  05/20/22 203 lb (92.1 kg)  05/01/22 199 lb (90.3 kg)     CMP ( most recent) CMP     Component Value Date/Time   NA 140 03/05/2022 0803   NA 138 10/04/2013 2025   K 4.4 03/05/2022 0803   K 3.0 (L) 10/04/2013 2025   CL 102 03/05/2022 0803   CL 104  10/04/2013 2025   CO2 22 03/05/2022 0803   CO2 25 10/04/2013 2025   GLUCOSE 198 (H) 03/05/2022 0803   GLUCOSE 117 (H) 08/18/2021 1613   GLUCOSE 175 (H) 10/04/2013 2025   BUN 11 03/05/2022 0803   BUN 13 10/04/2013 2025   CREATININE 1.05 03/05/2022 0803   CREATININE 1.04 11/16/2019  1505   CALCIUM 9.5 03/05/2022 0803   CALCIUM 9.2 10/04/2013 2025   PROT 6.5 03/05/2022 0803   PROT 7.4 10/04/2013 2025   ALBUMIN 4.8 03/05/2022 0803   ALBUMIN 4.1 10/04/2013 2025   AST 11 03/05/2022 0803   AST 8 (L) 10/04/2013 2025   ALT 17 03/05/2022 0803   ALT 19 10/04/2013 2025   ALKPHOS 97 03/05/2022 0803   ALKPHOS 114 10/04/2013 2025   BILITOT 0.8 03/05/2022 0803   BILITOT 0.4 10/04/2013 2025   GFRNONAA >60 06/30/2021 0857   GFRNONAA 83 11/16/2019 1505   GFRAA 96 11/16/2019 1505     Diabetic Labs (most recent): Lab Results  Component Value Date   HGBA1C 6.5 06/25/2022   HGBA1C 7.4 (A) 03/09/2022   HGBA1C 5.7 (H) 06/30/2021   MICROALBUR 46.3 (H) 10/22/2021   MICROALBUR 30 05/22/2020   MICROALBUR 2.6 10/20/2019     Lipid Panel ( most recent) Lipid Panel     Component Value Date/Time   CHOL 118 03/05/2022 0803   CHOL 180 03/23/2013 0422   TRIG 195 (H) 03/05/2022 0803   TRIG 218 (H) 03/23/2013 0422   HDL 32 (L) 03/05/2022 0803   HDL 21 (L) 03/23/2013 0422   CHOLHDL 3.7 03/05/2022 0803   CHOLHDL 3.5 08/18/2021 1613   VLDL 31 03/28/2021 0852   VLDL 44 (H) 03/23/2013 0422   LDLCALC 54 03/05/2022 0803   LDLCALC 46 08/18/2021 1613   LDLCALC 115 (H) 03/23/2013 0422   LDLDIRECT 176.0 10/18/2018 1406   LABVLDL 32 03/05/2022 0803      Lab Results  Component Value Date   TSH 1.49 08/18/2021   TSH 1.32 11/16/2019   TSH 1.38 10/18/2018   TSH 1.07 06/11/2016   TSH 1.66 03/12/2014   TSH 1.140 11/24/2013   TSH 1.42 02/25/2012   FREET4 1.2 11/16/2019   FREET4 1.00 04/23/2014      Assessment & Plan:   1. Uncontrolled type 2 diabetes mellitus with CAD - LAKOTA ZELAZNY has  currently uncontrolled symptomatic type 2 DM since  52 years of age.  He uses freestyle libre device.  AGP report shows 39% time in range, 57% slightly above range-level 1 hyperglycemia.  His point-of-care A1c is 6.5% improving from 7.4%.  He does not have hypoglycemia.      Recent labs reviewed, showing normal renal function. - I had a long discussion with him about the progressive nature of diabetes and the pathology behind its complications. -his diabetes is complicated by CAD s/p stent placement, smoking and he remains at a high risk for more acute and chronic complications which include CVA, CKD, retinopathy, and neuropathy. These are all discussed in detail with him.  - I have counseled him on diet  and weight management  by adopting a carbohydrate restricted/protein rich diet. Patient is encouraged to switch to  unprocessed or minimally processed     complex starch and increased protein intake (animal or plant source), fruits, and vegetables. -  he is advised to stick to a routine mealtimes to eat 3 meals  a day and avoid unnecessary snacks ( to snack only to correct hypoglycemia).  - he acknowledges that there is a room for improvement in his food and drink choices. - Suggestion is made for him to avoid simple carbohydrates  from his diet including Cakes, Sweet Desserts, Ice Cream, Soda (diet and regular), Sweet Tea, Candies, Chips, Cookies, Store Bought Juices, Alcohol , Artificial Sweeteners,  Coffee Creamer, and "Sugar-free" Products, Lemonade. This  will help patient to have more stable blood glucose profile and potentially avoid unintended weight gain.  The following Lifestyle Medicine recommendations according to American College of Lifestyle Medicine  [facility]) were discussed and and offered to patient and he  agrees to start the journey:  A. Whole Foods, Plant-Based Nutrition comprising of fruits and vegetables, plant-based proteins, whole-grain carbohydrates was discussed in detail with  the patient.   A list for source of those nutrients were also provided to the patient.  Patient will use only water or unsweetened tea for hydration. B.  The need to stay away from risky substances including alcohol, smoking; obtaining 7 to 9 hours of restorative sleep, at least 150 minutes of moderate intensity exercise weekly, the importance of healthy social connections,  and stress management techniques were discussed. C.  A full color page of  Calorie density of various food groups per pound showing examples of each food groups was provided to the patient.   - he will be scheduled with Norm Salt, RDN, CDE for diabetes education.  - I have approached him with the following individualized plan to manage  his diabetes and patient agrees:   -He will not need insulin treatment for now.  His insurance did not provide coverage for [place].  He is responding to his current regimen of glipizide and metformin.  He is advised to continue metformin 850 mg p.o. twice daily, glipizide 5 mg XL p.o. daily at breakfast.  He is advised to continue to use his CGM device.  He is advised to call clinic for hypoglycemia below 70 or hyperglycemia above 200 mg per DL weekly average. - Specific targets for  A1c;  LDL, HDL, Triglycerides, and  Waist Circumference were discussed with the patient.  2) Blood Pressure /Hypertension: -His blood pressure is controlled to target.  He is advised to continue metoprolol 25 mg p.o. twice daily and lisinopril 20 mg p.o. daily.  During his next visit, he will be considered for stopping metoprolol.      3) Lipids/Hyperlipidemia:   Review of his recent repeat lipid panel showed improved LDL to 54.  He is advised to continue Crestor 20 mg p.o. nightly.     Side effects and precautions discussed with him.     4)  Weight/Diet:  Body mass index is 27.65 kg/m.  He is not a candidate for major weight loss.  Exercise, and detailed carbohydrates information provided  -  detailed  on discharge instructions.  5) Chronic Care/Health Maintenance:  -he  is on  Statin medications and  is encouraged to initiate and continue to follow up with Ophthalmology, Dentist,  Podiatrist at least yearly or according to recommendations, and has successfully quit smoking.    -  I have recommended yearly flu vaccine and pneumonia vaccine at least every 5 years; moderate intensity exercise for up to 150 minutes weekly; and  sleep for at least 7 hours a day.  - he is  advised to maintain close follow up with [name], MD for primary care needs, as well as his other providers for optimal and coordinated care.  I spent  26  minutes in the care of the patient today including review of labs from CMP, Lipids, Thyroid Function, Hematology (current and previous including abstractions from other facilities); face-to-face time discussing  his blood glucose readings/logs, discussing hypoglycemia and hyperglycemia episodes and symptoms, medications doses, his options of short and long term treatment based on the latest standards of care /  guidelines;  discussion about incorporating lifestyle medicine;  and documenting the encounter. Risk reduction counseling performed per USPSTF guidelines to reduce cardiovascular risk factors.     Please refer to Patient Instructions for Blood Glucose Monitoring and Insulin/Medications Dosing Guide"  in media tab for additional information. Please  also refer to " Patient Self Inventory" in the Media  tab for reviewed elements of pertinent patient history.  Vella Redhead participated in the discussions, expressed understanding, and voiced agreement with the above plans.  All questions were answered to his satisfaction. he is encouraged to contact clinic should he have any questions or concerns prior to his return visit.    Follow up plan: - Return in about 4 months (around 10/26/2022) for F/U with Pre-visit Labs, Meter/CGM/Logs, A1c here.  Marquis Lunch, MD [facility] [address] [place], [place] [address] Phone: [phone]  Fax: [phone]    06/25/2022, 3:45 PM  This note was partially dictated with voice recognition software. Similar sounding words can be transcribed inadequately or may not  be corrected upon review.

## 2022-06-25 NOTE — Patient Instructions (Signed)
                                     Advice for Weight Management  -For most of us the best way to lose weight is by diet management. Generally speaking, diet management means consuming less calories intentionally which over time brings about progressive weight loss.  This can be achieved more effectively by avoiding ultra processed carbohydrates, processed meats, unhealthy fats.    It is critically important to know your numbers: how much calorie you are consuming and how much calorie you need. More importantly, our carbohydrates sources should be unprocessed naturally occurring  complex starch food items.  It is always important to balance nutrition also by  appropriate intake of proteins (mainly plant-based), healthy fats/oils, plenty of fruits and vegetables.   -The American College of Lifestyle Medicine (ACL M) recommends nutrition derived mostly from Whole Food, Plant Predominant Sources example an apple instead of applesauce or apple pie. Eat Plenty of vegetables, Mushrooms, fruits, Legumes, Whole Grains, Nuts, seeds in lieu of processed meats, processed snacks/pastries red meat, poultry, eggs.  Use only water or unsweetened tea for hydration.  The College also recommends the need to stay away from risky substances including alcohol, smoking; obtaining 7-9 hours of restorative sleep, at least 150 minutes of moderate intensity exercise weekly, importance of healthy social connections, and being mindful of stress and seek help when it is overwhelming.    -Sticking to a routine mealtime to eat 3 meals a day and avoiding unnecessary snacks is shown to have a big role in weight control. Under normal circumstances, the only time we burn stored energy is when we are hungry, so allow  some hunger to take place- hunger means no food between appropriate meal times, only water.  It is not advisable to starve.   -It is better to avoid simple carbohydrates including:  Cakes, Sweet Desserts, Ice Cream, Soda (diet and regular), Sweet Tea, Candies, Chips, Cookies, Store Bought Juices, Alcohol in Excess of  1-2 drinks a day, Lemonade,  Artificial Sweeteners, Doughnuts, Coffee Creamers, "Sugar-free" Products, etc, etc.  This is not a complete list.....    -Consulting with certified diabetes educators is proven to provide you with the most accurate and current information on diet.  Also, you may be  interested in discussing diet options/exchanges , we can schedule a visit with Jacob Baker, RDN, CDE for individualized nutrition education.  -Exercise: If you are able: 30 -60 minutes a day ,4 days a week, or 150 minutes of moderate intensity exercise weekly.    The longer the better if tolerated.  Combine stretch, strength, and aerobic activities.  If you were told in the past that you have high risk for cardiovascular diseases, or if you are currently symptomatic, you may seek evaluation by your heart doctor prior to initiating moderate to intense exercise programs.                                  Additional Care Considerations for Diabetes/Prediabetes   -Diabetes  is a chronic disease.  The most important care consideration is regular follow-up with your diabetes care provider with the goal being avoiding or delaying its complications and to take advantage of advances in medications and technology.  If appropriate actions are taken early enough, type 2 diabetes can even be   reversed.  Seek information from the right source.  - Whole Food, Plant Predominant Nutrition is highly recommended: Eat Plenty of vegetables, Mushrooms, fruits, Legumes, Whole Grains, Nuts, seeds in lieu of processed meats, processed snacks/pastries red meat, poultry, eggs as recommended by American College of  Lifestyle Medicine (ACLM).  -Type 2 diabetes is known to coexist with other important comorbidities such as high blood pressure and high cholesterol.  It is critical to control not only the  diabetes but also the high blood pressure and high cholesterol to minimize and delay the risk of complications including coronary artery disease, stroke, amputations, blindness, etc.  The good news is that this diet recommendation for type 2 diabetes is also very helpful for managing high cholesterol and high blood blood pressure.  - Studies showed that people with diabetes will benefit from a class of medications known as ACE inhibitors and statins.  Unless there are specific reasons not to be on these medications, the standard of care is to consider getting one from these groups of medications at an optimal doses.  These medications are generally considered safe and proven to help protect the heart and the kidneys.    - People with diabetes are encouraged to initiate and maintain regular follow-up with eye doctors, foot doctors, dentists , and if necessary heart and kidney doctors.     - It is highly recommended that people with diabetes quit smoking or stay away from smoking, and get yearly  flu vaccine and pneumonia vaccine at least every 5 years.  See above for additional recommendations on exercise, sleep, stress management , and healthy social connections.      

## 2022-07-26 IMAGING — DX DG CHEST 2V
2 series · 2 of 2 positions shown · non-contrast
Comparison: Chest x-ray 03/24/2020.

CLINICAL DATA: 50-year-old male with history of persistent cough
for the past several weeks.

EXAM:
CHEST - 2 VIEW

[chest pa]
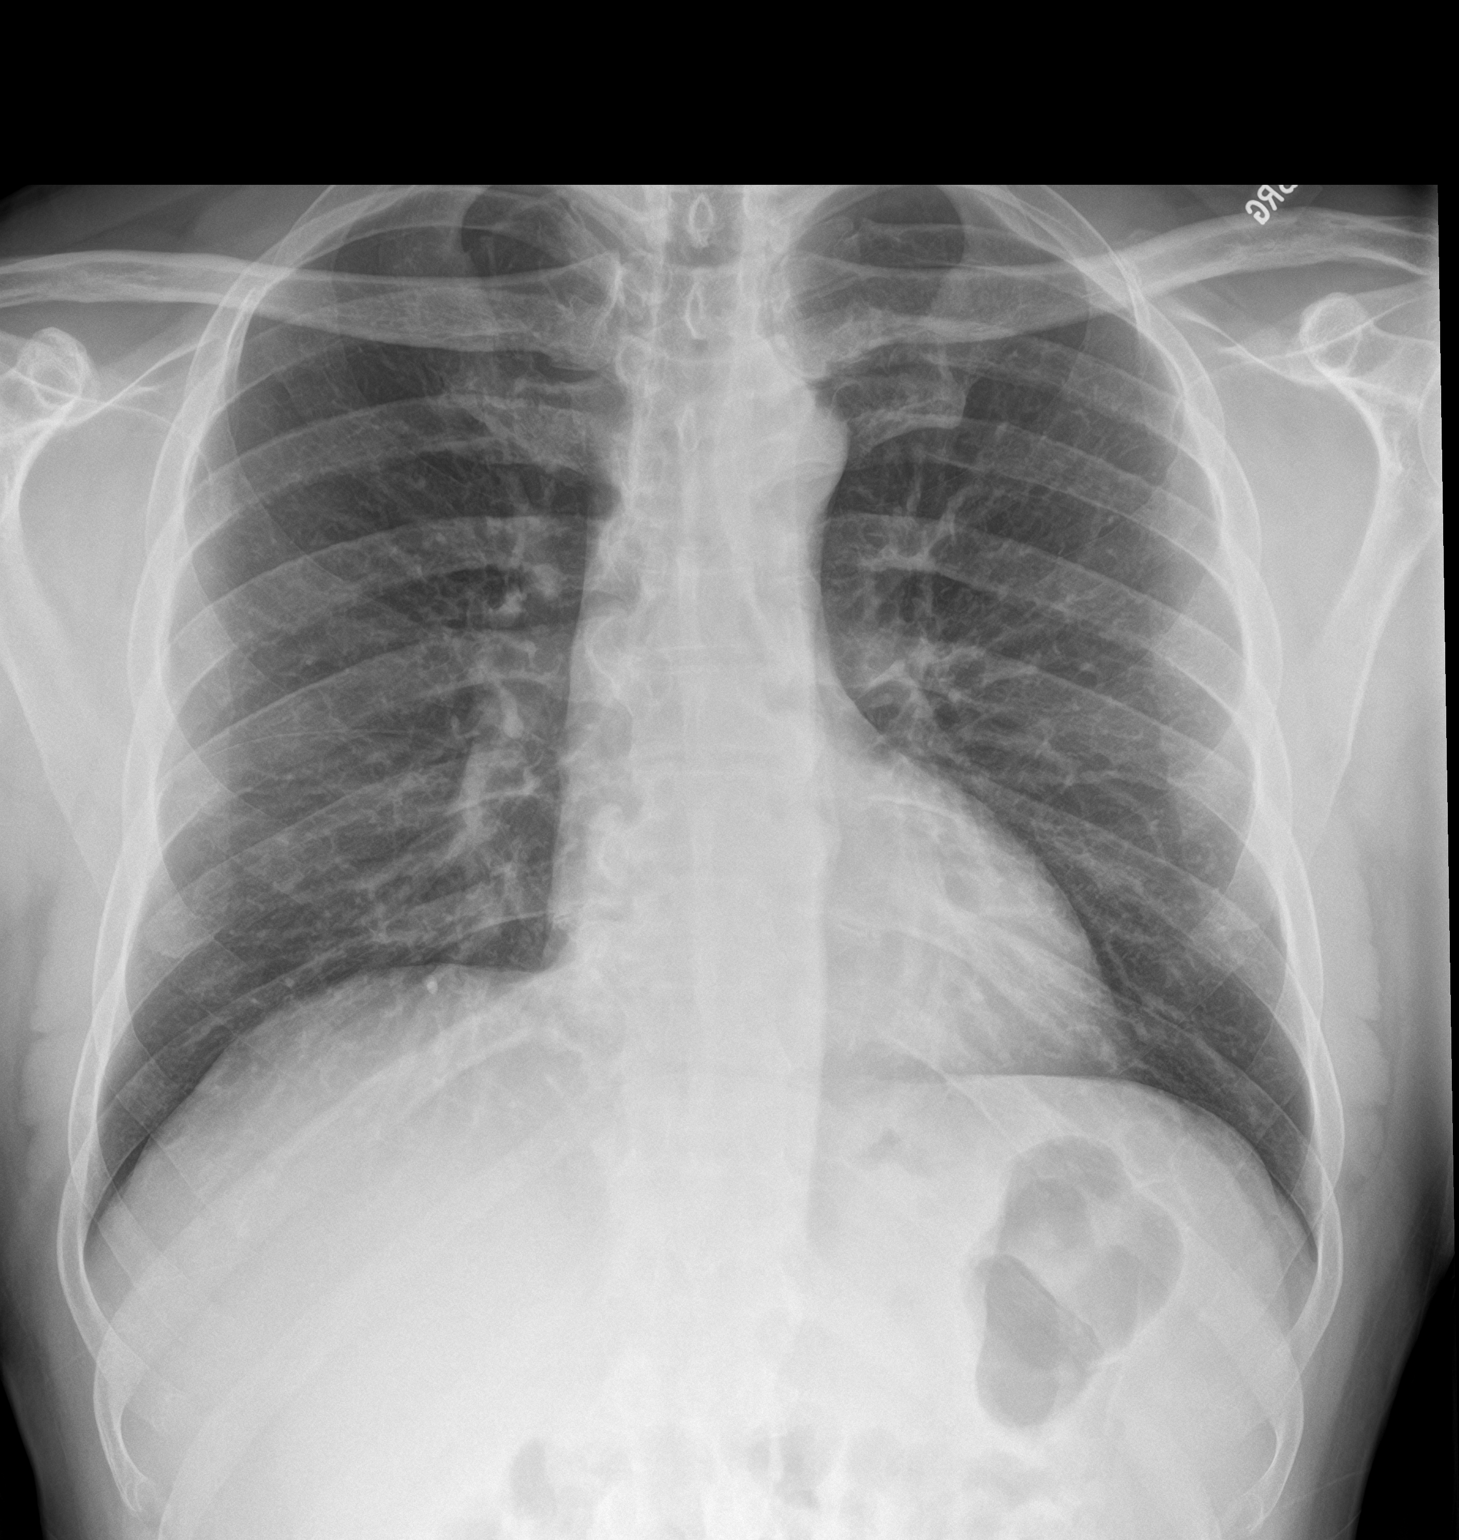

[chest lat]
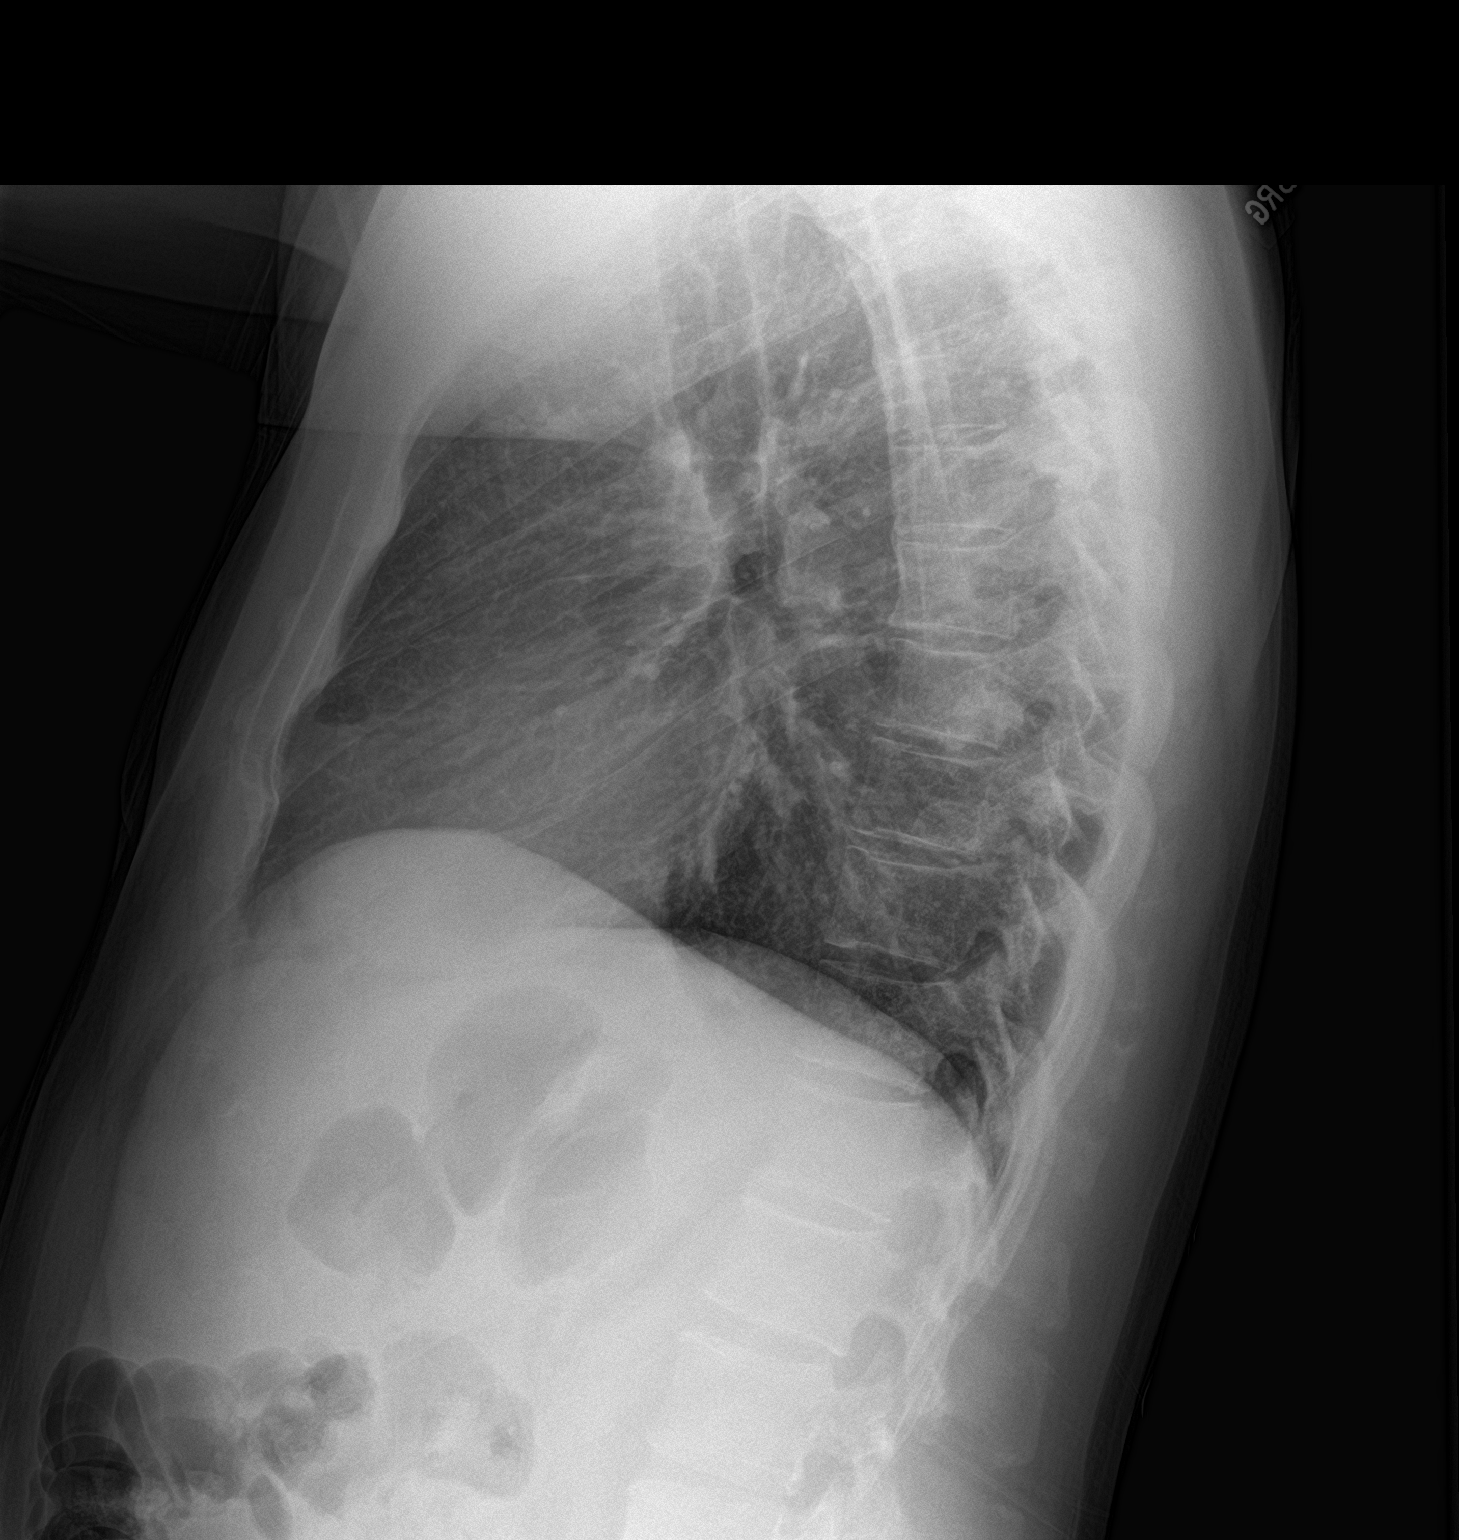

[2 of 2 positions shown; findings below may reference images not displayed]

FINDINGS: Lung volumes are normal. No consolidative airspace disease. No
pleural effusions. No pneumothorax. No pulmonary nodule or mass
noted. Pulmonary vasculature and the cardiomediastinal silhouette
are within normal limits.
IMPRESSION: No radiographic evidence of acute cardiopulmonary disease.

## 2022-07-29 ENCOUNTER — Ambulatory Visit: Payer: BC Managed Care – PPO | Admitting: Urology

## 2022-07-29 VITALS — BP 122/76 | HR 73

## 2022-07-29 DIAGNOSIS — N401 Enlarged prostate with lower urinary tract symptoms: Secondary | ICD-10-CM

## 2022-07-29 DIAGNOSIS — R3916 Straining to void: Secondary | ICD-10-CM

## 2022-07-29 DIAGNOSIS — R351 Nocturia: Secondary | ICD-10-CM | POA: Diagnosis not present

## 2022-07-29 MED ORDER — ALFUZOSIN HCL ER 10 MG PO TB24: 10.0000 mg | ORAL_TABLET | Freq: Two times a day (BID) | ORAL | 11 refills | Status: DC

## 2022-07-29 NOTE — Progress Notes (Signed)
07/29/2022 4:04 PM   Vella Redhead 07-21-1970 [id number]  Referring provider: [name], MD [address] [place],  [place] [address]  Followup BPH   HPI:  Mr Watrous is a 52yo here for followup for BPH and nocturia. Since starting uroxatral his back pain has improved and nocturia has improved. IPSS 11 QOL 2 on uroxatral. Urine stream strong. Nocturia 1-2x. He has a strong stream on uroxatral.   PMH: Past Medical History:  Diagnosis Date   (HFpEF) heart failure with preserved ejection fraction (HCC)    a. 12/2020 Echo: EF 65-70%, no rwma, mild LVH, nl RV fxn, triv MR.   CAD (coronary artery disease)    a. 12/2020 NSTEMI/PCI: LM nl, LAD min irregs, D1 99 (2.0x12 Onyx Frontier DES), LCX 85p (small), RCA 80p/m (4.0x18 Onyx Frontier DES - staged following day), 60d, RPDA mod dzs. EF 50-55%.   DVT (deep venous thrombosis) (HCC) 04/2017   GERD (gastroesophageal reflux disease)    History of migraine    Hyperlipidemia    Hypertension    Long term current use of antithrombotics/antiplatelets    a.) on daily DAPT therapy (ticagrelor + ASA)   Non-STEMI (non-ST elevated myocardial infarction) (HCC) 12/30/2020   Syncope and collapse    a.) single episode; related to dehydration   T2DM (type 2 diabetes mellitus) (HCC)     Surgical History: Past Surgical History:  Procedure Laterality Date   CHOLECYSTECTOMY     COLONOSCOPY     COLONOSCOPY WITH PROPOFOL N/A 08/06/2017   Procedure: COLONOSCOPY WITH PROPOFOL;  Surgeon: Midge Minium, MD;  Location: [facility];  Service: Endoscopy;  Laterality: N/A;  Diabetic - oral meds   CORONARY STENT INTERVENTION N/A 12/31/2020   Procedure: CORONARY STENT INTERVENTION;  Surgeon: Lyn Records, MD;  Location: [facility] CV [facility];  Service: Cardiovascular;  Laterality: N/A;   CORONARY STENT INTERVENTION N/A 01/01/2021   Procedure: CORONARY STENT INTERVENTION;  Surgeon: Corky Crafts, MD;  Location: [facility] INV[facility] CV LAB;   Service: Cardiovascular;  Laterality: N/A;   ESOPHAGEAL DILATION  09/24/2016   Procedure: ESOPHAGEAL DILATION;  Surgeon: Midge Minium, MD;  Location: [facility];  Service: Gastroenterology;;   ESOPHAGOGASTRODUODENOSCOPY (EGD) WITH PROPOFOL N/A 04/08/2015   Procedure: ESOPHAGOGASTRODUODENOSCOPY (EGD) WITH PROPOFOL with dialation;  Surgeon: Midge Minium, MD;  Location: [facility];  Service: Endoscopy;  Laterality: N/A;  diabetic - oral meds   ESOPHAGOGASTRODUODENOSCOPY (EGD) WITH PROPOFOL N/A 09/24/2016   Procedure: ESOPHAGOGASTRODUODENOSCOPY (EGD) WITH PROPOFOL WITH DILATION;  Surgeon: Midge Minium, MD;  Location: [facility];  Service: Gastroenterology;  Laterality: N/A;  Diabetic - oral meds   FINGER AMPUTATION  01/27/2007   partial removal left index finger    LEFT HEART CATH AND CORONARY ANGIOGRAPHY N/A 12/31/2020   Procedure: LEFT HEART CATH AND CORONARY ANGIOGRAPHY;  Surgeon: Lyn Records, MD;  Location: [facility] CV [facility];  Service: Cardiovascular;  Laterality: N/A;   LEFT HEART CATH AND CORONARY ANGIOGRAPHY N/A 01/01/2021   Procedure: LEFT HEART CATH AND CORONARY ANGIOGRAPHY;  Surgeon: Corky Crafts, MD;  Location: [facility] INVASIVE CV LAB;  Service: Cardiovascular;  Laterality: N/A;   POLYPECTOMY  08/06/2017   Procedure: POLYPECTOMY INTESTINAL;  Surgeon: Midge Minium, MD;  Location: [facility];  Service: Endoscopy;;    Home Medications:  Allergies as of 07/29/2022   No Known Allergies      Medication List        Accurate as of July 29, 2022  4:04 PM. If you have any questions, ask your nurse or doctor.          albuterol 108 (90 Base) MCG/ACT inhaler Commonly known as: VENTOLIN HFA Inhale 2 puffs into the lungs every 6 (six) hours as needed for wheezing or shortness of breath.   alfuzosin 10 MG 24 hr tablet Commonly known as: UROXATRAL Take 1 tablet (10 mg total) by mouth at bedtime.   amLODipine 10 MG tablet Commonly known as:  NORVASC TAKE 1 TABLET(10 MG) BY MOUTH DAILY   aspirin EC 81 MG tablet Take 1 tablet (81 mg total) by mouth daily with breakfast. Swallow whole.   cyanocobalamin 1000 MCG tablet Commonly known as: VITAMIN B12 Take 1,000 mcg by mouth daily.   FreeStyle Libre 3 Sensor Misc PLACE 1 SENSOR ON THE SKIN EVERY 14 DAYS. USE TO CHECK GLUCOSE CONTINUOUSLY   glipiZIDE 5 MG 24 hr tablet Commonly known as: GLUCOTROL XL Take 1 tablet (5 mg total) by mouth daily with breakfast.   glucose blood test strip Commonly known as: OneTouch Verio Test sugars twice daily   metFORMIN 850 MG tablet Commonly known as: GLUCOPHAGE Take 1 tablet (850 mg total) by mouth 2 (two) times daily with a meal.   metoprolol tartrate 25 MG tablet Commonly known as: LOPRESSOR TAKE 1 TABLET BY MOUTH TWICE DAILY   nitroGLYCERIN 0.4 MG SL tablet Commonly known as: Nitrostat Place 1 tablet (0.4 mg total) under the tongue every 5 (five) minutes as needed for chest pain.   OneTouch Delica Lancets 33G Misc Test sugars twice daily   pantoprazole 40 MG tablet Commonly known as: PROTONIX Take 1 tablet (40 mg total) by mouth daily. TAKE 1 TABLET(40 MG) BY MOUTH TWICE DAILY   ranolazine 500 MG 12 hr tablet Commonly known as: Ranexa Take 1 tablet (500 mg total) by mouth 2 (two) times daily.   rosuvastatin 20 MG tablet Commonly known as: CRESTOR Take 1 tablet (20 mg total) by mouth daily.        Allergies: No Known Allergies  Family History: Family History  Problem Relation Age of Onset   Hypertension Mother    Heart disease Father        CAD   Hypertension Father    Diabetes Father    Hypertension Sister    Hypertension Brother    Hypertension Brother     Social History:  reports that he quit smoking about 18 months ago. His smoking use included cigarettes. He has a 54.00 pack-year smoking history. He has never been exposed to tobacco smoke. He has never used smokeless tobacco. He reports that he does not  drink alcohol and does not use drugs.  ROS: All other review of systems were reviewed and are negative except what is noted above in HPI  Physical Exam: BP 122/76   Pulse 73   Constitutional:  Alert and oriented, No acute distress. HEENT: [place] AT, moist mucus membranes.  Trachea midline, no masses. Cardiovascular: No clubbing, cyanosis, or edema. Respiratory: Normal respiratory effort, no increased work of breathing. GI: Abdomen is soft, nontender, nondistended, no abdominal masses GU: No CVA tenderness.  Lymph: No cervical or inguinal lymphadenopathy. Skin: No rashes, bruises or suspicious lesions. Neurologic: Grossly intact, no focal deficits, moving all 4 extremities. Psychiatric: Normal mood and affect.  Laboratory Data: Lab Results  Component Value Date   WBC 6.5 08/18/2021   HGB 10.6 (L) 08/18/2021   HCT 30.2 (L) 08/18/2021   MCV 81.2 08/18/2021   PLT  250.0 08/18/2021    Lab Results  Component Value Date   CREATININE 1.05 03/05/2022    Lab Results  Component Value Date   PSA 0.45 10/23/2021   PSA 0.72 10/21/2020   PSA 0.67 10/20/2019    Lab Results  Component Value Date   TESTOSTERONE 182 (L) 08/21/2020    Lab Results  Component Value Date   HGBA1C 6.5 06/25/2022    Urinalysis    Component Value Date/Time   COLORURINE YELLOW 04/14/2022 1640   APPEARANCEUR Clear 06/03/2022 1422   LABSPEC 1.020 04/14/2022 1640   LABSPEC 1.021 10/04/2013 2025   PHURINE 5.5 04/14/2022 1640   GLUCOSEU Negative 06/03/2022 1422   GLUCOSEU NEGATIVE 04/14/2022 1640   HGBUR NEGATIVE 04/14/2022 1640   BILIRUBINUR Negative 06/03/2022 1422   BILIRUBINUR Negative 10/04/2013 2025   KETONESUR NEGATIVE 04/14/2022 1640   PROTEINUR 1+ (A) 06/03/2022 1422   PROTEINUR 30 mg/dL 16/10/9602 5409   UROBILINOGEN 0.2 04/14/2022 1640   NITRITE Negative 06/03/2022 1422   NITRITE NEGATIVE 04/14/2022 1640   LEUKOCYTESUR Negative 06/03/2022 1422   LEUKOCYTESUR NEGATIVE 04/14/2022 1640    LEUKOCYTESUR Negative 10/04/2013 2025    Lab Results  Component Value Date   LABMICR Comment 06/03/2022   BACTERIA NONE SEEN 10/04/2013    Pertinent Imaging:  No results found for this or any previous visit.  No results found for this or any previous visit.  No results found for this or any previous visit.  No results found for this or any previous visit.  No results found for this or any previous visit.  No valid procedures specified. No results found for this or any previous visit.  No results found for this or any previous visit.   Assessment & Plan:    1. Benign prostatic hyperplasia (BPH) with straining on urination -increase uroxatral to 10mg  BID - Urinalysis, Routine w reflex microscopic  2. Nocturia -uroxatral 10mg  BID   No follow-ups on file.  Wilkie Aye, MD  [facility] [place]

## 2022-07-30 LAB — URINALYSIS, ROUTINE W REFLEX MICROSCOPIC
Bilirubin, UA: NEGATIVE
Glucose, UA: NEGATIVE
Ketones, UA: NEGATIVE
Leukocytes,UA: NEGATIVE
Nitrite, UA: NEGATIVE
Specific Gravity, UA: 1.025 (ref 1.005–1.030)
Urobilinogen, Ur: 1 mg/dL (ref 0.2–1.0)
pH, UA: 5.5 (ref 5.0–7.5)

## 2022-07-30 LAB — MICROSCOPIC EXAMINATION
Bacteria, UA: NONE SEEN
WBC, UA: NONE SEEN /hpf (ref 0–5)

## 2022-08-04 ENCOUNTER — Encounter: Payer: Self-pay | Admitting: Urology

## 2022-08-04 NOTE — Patient Instructions (Signed)

## 2022-08-18 ENCOUNTER — Other Ambulatory Visit: Payer: Self-pay | Admitting: "Endocrinology

## 2022-08-18 DIAGNOSIS — Z87891 Personal history of nicotine dependence: Secondary | ICD-10-CM

## 2022-08-20 ENCOUNTER — Telehealth: Payer: Self-pay | Admitting: *Deleted

## 2022-08-20 ENCOUNTER — Encounter: Payer: Self-pay | Admitting: Cardiology

## 2022-08-20 NOTE — Telephone Encounter (Signed)
Called patient again d/t urgent MyChart msg. No answer. Left another msg for pt to return call. Called pt's spouse ( ok per DPR) no answer. Left msg that spouse or pt call our office.

## 2022-09-08 ENCOUNTER — Other Ambulatory Visit: Payer: Self-pay | Admitting: "Endocrinology

## 2022-09-10 ENCOUNTER — Encounter (INDEPENDENT_AMBULATORY_CARE_PROVIDER_SITE_OTHER): Payer: Self-pay

## 2022-09-14 ENCOUNTER — Encounter: Payer: Self-pay | Admitting: Urology

## 2022-09-25 ENCOUNTER — Ambulatory Visit: Payer: BC Managed Care – PPO | Admitting: Urology

## 2022-09-29 ENCOUNTER — Other Ambulatory Visit: Payer: Self-pay

## 2022-09-29 MED ORDER — SILODOSIN 8 MG PO CAPS: 8.0000 mg | ORAL_CAPSULE | Freq: Every evening | ORAL | 11 refills | Status: DC

## 2022-10-23 ENCOUNTER — Ambulatory Visit (INDEPENDENT_AMBULATORY_CARE_PROVIDER_SITE_OTHER): Payer: BLUE CROSS/BLUE SHIELD | Admitting: Internal Medicine

## 2022-10-23 ENCOUNTER — Encounter: Payer: Self-pay | Admitting: Internal Medicine

## 2022-10-23 VITALS — BP 122/82 | HR 72 | Temp 97.4°F | Ht 73.0 in | Wt 199.6 lb

## 2022-10-23 DIAGNOSIS — R809 Proteinuria, unspecified: Secondary | ICD-10-CM

## 2022-10-23 DIAGNOSIS — Z122 Encounter for screening for malignant neoplasm of respiratory organs: Secondary | ICD-10-CM | POA: Diagnosis not present

## 2022-10-23 DIAGNOSIS — E1129 Type 2 diabetes mellitus with other diabetic kidney complication: Secondary | ICD-10-CM | POA: Diagnosis not present

## 2022-10-23 DIAGNOSIS — E1159 Type 2 diabetes mellitus with other circulatory complications: Secondary | ICD-10-CM | POA: Diagnosis not present

## 2022-10-23 DIAGNOSIS — Z7984 Long term (current) use of oral hypoglycemic drugs: Secondary | ICD-10-CM

## 2022-10-23 DIAGNOSIS — D369 Benign neoplasm, unspecified site: Secondary | ICD-10-CM

## 2022-10-23 DIAGNOSIS — D126 Benign neoplasm of colon, unspecified: Secondary | ICD-10-CM

## 2022-10-23 DIAGNOSIS — Z955 Presence of coronary angioplasty implant and graft: Secondary | ICD-10-CM

## 2022-10-23 DIAGNOSIS — Z0000 Encounter for general adult medical examination without abnormal findings: Secondary | ICD-10-CM | POA: Diagnosis not present

## 2022-10-23 DIAGNOSIS — Z87891 Personal history of nicotine dependence: Secondary | ICD-10-CM

## 2022-10-23 DIAGNOSIS — Z23 Encounter for immunization: Secondary | ICD-10-CM | POA: Diagnosis not present

## 2022-10-23 MED ORDER — AMLODIPINE BESYLATE 10 MG PO TABS: 10.0000 mg | ORAL_TABLET | Freq: Every day | ORAL | 1 refills | Status: DC

## 2022-10-23 MED ORDER — GLIPIZIDE ER 5 MG PO TB24: 5.0000 mg | ORAL_TABLET | Freq: Every day | ORAL | 1 refills | Status: DC

## 2022-10-23 MED ORDER — ROSUVASTATIN CALCIUM 40 MG PO TABS: 40.0000 mg | ORAL_TABLET | Freq: Every day | ORAL | 1 refills | Status: DC

## 2022-10-23 MED ORDER — CARVEDILOL 3.125 MG PO TABS: 3.1250 mg | ORAL_TABLET | Freq: Two times a day (BID) | ORAL | 3 refills | Status: DC

## 2022-10-23 NOTE — Patient Instructions (Addendum)
  We are changing your metoprolol  medication to carvedilol.  It is still a  beta blocker but more protective for the heart and also lowers blood pressure   Start with 1 tablet (3.125 mg )  twice daily   The swelling in  your ankles is aggravated by  your 10 mg dose of amlodipine  combined with your 12 hours of sitting daily   So get up every 1 hour and walk across across the room for a minute to pump calf muscles   If that doesn't help,  We could reduce amlodipine dose to 5 mg and  increase the carvedilol dose to 6.25 mg  every 12 hours   Goals of blod pressure is 130/80 or less (not below 110/70)   I have referred you for lung cancer screening to [facility].  They will meet with you and order a chest Ct  Ask the kidney doctor to check your PSA next month (it's too early today)

## 2022-10-23 NOTE — Progress Notes (Unsigned)
Patient ID: Jacob Baker, male    DOB: 22-Jun-1970  Age: 52 y.o. MRN: [id number]  The patient is here for annual preventive examination and management of other chronic and acute problems.   The risk factors are reflected in the social history.   The roster of all physicians providing medical care to patient - is listed in the Snapshot section of the chart.   Activities of daily living:  The patient is 100% independent in all ADLs: dressing, toileting, feeding as well as independent mobility   Home safety : The patient has smoke detectors in the home. They wear seatbelts.  There are no unsecured firearms at home. There is no violence in the home.    There is no risks for hepatitis, STDs or HIV. There is no   history of blood transfusion. They have no travel history to infectious disease endemic areas of the world.   The patient has seen their dentist in the last six month. They have seen their eye doctor in the last year. The patinet  denies slight hearing difficulty with regard to whispered voices and some television programs.  They have deferred audiologic testing in the last year.  They do not  have excessive sun exposure. Discussed the need for sun protection: hats, long sleeves and use of sunscreen if there is significant sun exposure.    Diet: the importance of a healthy diet is discussed. They do have a healthy diet.   The benefits of regular aerobic exercise were discussed. The patient  exercises  3 to 5 days per week  for  60 minutes.    Depression screen: there are no signs or vegative symptoms of depression- irritability, change in appetite, anhedonia, sadness/tearfullness.   The following portions of the patient's history were reviewed and updated as appropriate: allergies, current medications, past family history, past medical history,  past surgical history, past social history  and problem list.   Visual acuity was not assessed per patient preference since the patient has regular  follow up with an  ophthalmologist. Hearing and body mass index were assessed and reviewed.    During the course of the visit the patient was educated and counseled about appropriate screening and preventive services including : fall prevention , diabetes screening, nutrition counseling, colorectal cancer screening, and recommended immunizations.    Chief Complaint:  STOPPED THE ALFUZosin  ONE MONTH AGO BECAUSE OF CHEST PAIN THAT RESOLVED WITH SUSPENSION.  URINARY SYMPTOMS HAVE not refurned.        Review of Symptoms  Patient denies headache, fevers, malaise, unintentional weight loss, skin rash, eye pain, sinus congestion and sinus pain, sore throat, dysphagia,  hemoptysis , cough, dyspnea, wheezing, chest pain, palpitations, orthopnea, edema, abdominal pain, nausea, melena, diarrhea, constipation, flank pain, dysuria, hematuria, urinary  Frequency, nocturia, numbness, tingling, seizures,  Focal weakness, Loss of consciousness,  Tremor, insomnia, depression, anxiety, and suicidal ideation.    Physical Exam:  BP 122/82   Pulse 72   Temp (!) 97.4 F (36.3 C) (Oral)   Ht 6\' 1"  (1.854 m)   Wt 199 lb 9.6 oz (90.5 kg)   SpO2 98%   BMI 26.33 kg/m    Physical Exam  Assessment and Plan: There are no diagnoses linked to this encounter.  No follow-ups on file.  [name], MD

## 2022-10-24 LAB — MICROALBUMIN / CREATININE URINE RATIO
Creatinine, Urine: 111 mg/dL
Microalb/Creat Ratio: 170 mg/g{creat} — ABNORMAL HIGH (ref 0–29)
Microalbumin, Urine: 188.9 ug/mL

## 2022-10-24 MED ORDER — LISINOPRIL 5 MG PO TABS: 5.0000 mg | ORAL_TABLET | Freq: Every day | ORAL | 3 refills | Status: DC

## 2022-10-24 NOTE — Assessment & Plan Note (Addendum)
Repeat colonoscopy was done in 2024.   5 yr follow up advised.

## 2022-10-24 NOTE — Assessment & Plan Note (Signed)
He  hs not had anginal symptoms since stopping ALFUSOZIN   He I S taking Ranexa.  He completed  Brilinta unopposed for 6 months .  continue lisinopril and carvedilol and asa. For new onset proteinuria

## 2022-10-24 NOTE — Assessment & Plan Note (Addendum)
he is not currently on ACE I or AR B .   RAS  was ruled out in April  with renal artery dopplers..  will resume lisinopril

## 2022-10-24 NOTE — Assessment & Plan Note (Signed)

## 2022-11-03 ENCOUNTER — Other Ambulatory Visit: Payer: Self-pay | Admitting: "Endocrinology

## 2022-11-03 ENCOUNTER — Encounter: Payer: Self-pay | Admitting: "Endocrinology

## 2022-11-03 ENCOUNTER — Ambulatory Visit: Payer: BLUE CROSS/BLUE SHIELD | Admitting: "Endocrinology

## 2022-11-03 VITALS — BP 116/64 | HR 76 | Ht 73.0 in | Wt 203.6 lb

## 2022-11-03 DIAGNOSIS — Z7984 Long term (current) use of oral hypoglycemic drugs: Secondary | ICD-10-CM | POA: Diagnosis not present

## 2022-11-03 DIAGNOSIS — I10 Essential (primary) hypertension: Secondary | ICD-10-CM | POA: Diagnosis not present

## 2022-11-03 DIAGNOSIS — E1159 Type 2 diabetes mellitus with other circulatory complications: Secondary | ICD-10-CM | POA: Diagnosis not present

## 2022-11-03 DIAGNOSIS — E782 Mixed hyperlipidemia: Secondary | ICD-10-CM | POA: Diagnosis not present

## 2022-11-03 LAB — POCT GLYCOSYLATED HEMOGLOBIN (HGB A1C): HbA1c, POC (controlled diabetic range): 6.6 % (ref 0.0–7.0)

## 2022-11-03 MED ORDER — TIRZEPATIDE 2.5 MG/0.5ML ~~LOC~~ SOAJ: 2.5000 mg | SUBCUTANEOUS | 0 refills | Status: DC

## 2022-11-03 NOTE — Progress Notes (Signed)
11/03/2022, 5:04 PM   Endocrinology follow-up note  Subjective:    Patient ID: Jacob Baker, male    DOB: 1970-09-17.  Jacob Baker is being seen in follow-up after he was seen in consultation for management of currently uncontrolled symptomatic diabetes requested by  [name], MD.   Past Medical History:  Diagnosis Date   (HFpEF) heart failure with preserved ejection fraction (HCC)    a. 12/2020 Echo: EF 65-70%, no rwma, mild LVH, nl RV fxn, triv MR.   CAD (coronary artery disease)    a. 12/2020 NSTEMI/PCI: LM nl, LAD min irregs, D1 99 (2.0x12 Onyx Frontier DES), LCX 85p (small), RCA 80p/m (4.0x18 Onyx Frontier DES - staged following day), 60d, RPDA mod dzs. EF 50-55%.   DVT (deep venous thrombosis) (HCC) 04/2017   GERD (gastroesophageal reflux disease)    History of migraine    Hyperlipidemia    Hypertension    Long term current use of antithrombotics/antiplatelets    a.) on daily DAPT therapy (ticagrelor + ASA)   Non-STEMI (non-ST elevated myocardial infarction) (HCC) 12/30/2020   Syncope and collapse    a.) single episode; related to dehydration   T2DM (type 2 diabetes mellitus) (HCC)     Past Surgical History:  Procedure Laterality Date   CHOLECYSTECTOMY     COLONOSCOPY     COLONOSCOPY WITH PROPOFOL N/A 08/06/2017   Procedure: COLONOSCOPY WITH PROPOFOL;  Surgeon: Midge Minium, MD;  Location: [facility];  Service: Endoscopy;  Laterality: N/A;  Diabetic - oral meds   CORONARY STENT INTERVENTION N/A 12/31/2020   Procedure: CORONARY STENT INTERVENTION;  Surgeon: Lyn Records, MD;  Location: [facility] INVASIVE CV LAB;  Service: Cardiovascular;  Laterality: N/A;   CORONARY STENT INTERVENTION N/A 01/01/2021   Procedure: CORONARY STENT INTERVENTION;  Surgeon: Corky Crafts, MD;  Location: [facility] INVASIVE CV LAB;  Service: Cardiovascular;  Laterality: N/A;   ESOPHAGEAL DILATION   09/24/2016   Procedure: ESOPHAGEAL DILATION;  Surgeon: Midge Minium, MD;  Location: [facility];  Service: Gastroenterology;;   ESOPHAGOGASTRODUODENOSCOPY (EGD) WITH PROPOFOL N/A 04/08/2015   Procedure: ESOPHAGOGASTRODUODENOSCOPY (EGD) WITH PROPOFOL with dialation;  Surgeon: Midge Minium, MD;  Location: [facility];  Service: Endoscopy;  Laterality: N/A;  diabetic - oral meds   ESOPHAGOGASTRODUODENOSCOPY (EGD) WITH PROPOFOL N/A 09/24/2016   Procedure: ESOPHAGOGASTRODUODENOSCOPY (EGD) WITH PROPOFOL WITH DILATION;  Surgeon: Midge Minium, MD;  Location: [facility];  Service: Gastroenterology;  Laterality: N/A;  Diabetic - oral meds   FINGER AMPUTATION  01/27/2007   partial removal left index finger    LEFT HEART CATH AND CORONARY ANGIOGRAPHY N/A 12/31/2020   Procedure: LEFT HEART CATH AND CORONARY ANGIOGRAPHY;  Surgeon: Lyn Records, MD;  Location: [facility] INVASIVE CV LAB;  Service: Cardiovascular;  Laterality: N/A;   LEFT HEART CATH AND CORONARY ANGIOGRAPHY N/A 01/01/2021   Procedure: LEFT HEART CATH AND CORONARY ANGIOGRAPHY;  Surgeon: Corky Crafts, MD;  Location: [facility] INVASIVE CV LAB;  Service: Cardiovascular;  Laterality: N/A;   POLYPECTOMY  08/06/2017   Procedure: POLYPECTOMY INTESTINAL;  Surgeon: Midge Minium, MD;  Location: [facility];  Service: Endoscopy;;  Social History   Socioeconomic History   Marital status: Married    Spouse name: Carollee Herter   Number of children: 4   Years of education: Not on file   Highest education level: Not on file  Occupational History   Not on file  Tobacco Use   Smoking status: Former    Current packs/day: 0.00    Average packs/day: 2.0 packs/day for 27.0 years (54.0 ttl pk-yrs)    Types: Cigarettes    Start date: 12/30/1993    Quit date: 12/30/2020    Years since quitting: 1.8    Passive exposure: Never   Smokeless tobacco: Never  Vaping Use   Vaping status: Never Used  Substance and Sexual Activity   Alcohol  use: No   Drug use: No   Sexual activity: Not on file  Other Topics Concern   Not on file  Social History Narrative   Not on file   Social Determinants of Health   Financial Resource Strain: Not on file  Food Insecurity: Not on file  Transportation Needs: Not on file  Physical Activity: Not on file  Stress: Not on file  Social Connections: Not on file    Family History  Problem Relation Age of Onset   Hypertension Mother    Heart disease Father        CAD   Hypertension Father    Diabetes Father    Hypertension Sister    Hypertension Brother    Hypertension Brother     Outpatient Encounter Medications as of 11/03/2022  Medication Sig   tirzepatide (MOUNJARO) 2.5 MG/0.5ML Pen Inject 2.5 mg into the skin once a week.   albuterol (VENTOLIN HFA) 108 (90 Base) MCG/ACT inhaler Inhale 2 puffs into the lungs every 6 (six) hours as needed for wheezing or shortness of breath.   amLODipine (NORVASC) 10 MG tablet Take 1 tablet (10 mg total) by mouth daily.   aspirin 81 MG EC tablet Take 1 tablet (81 mg total) by mouth daily with breakfast. Swallow whole.   carvedilol (COREG) 3.125 MG tablet Take 1 tablet (3.125 mg total) by mouth 2 (two) times daily with a meal.   Continuous Glucose Sensor (FREESTYLE LIBRE 3 SENSOR) MISC PLACE 1 SENSOR ON THE SKIN EVERY 14 DAYS. USE TO CHECK GLUCOSE CONTINUOUSLY   glipiZIDE (GLUCOTROL XL) 5 MG 24 hr tablet Take 1 tablet (5 mg total) by mouth daily with breakfast.   glucose blood (ONETOUCH VERIO) test strip Test sugars twice daily   lisinopril (ZESTRIL) 5 MG tablet Take 1 tablet (5 mg total) by mouth daily.   metFORMIN (GLUCOPHAGE) 850 MG tablet TAKE 1 TABLET(850 MG) BY MOUTH TWICE DAILY WITH A MEAL   nitroGLYCERIN (NITROSTAT) 0.4 MG SL tablet Place 1 tablet (0.4 mg total) under the tongue every 5 (five) minutes as needed for chest pain.   ONETOUCH DELICA LANCETS 33G MISC Test sugars twice daily   pantoprazole (PROTONIX) 40 MG tablet Take 1 tablet (40  mg total) by mouth daily. TAKE 1 TABLET(40 MG) BY MOUTH TWICE DAILY   ranolazine (RANEXA) 500 MG 12 hr tablet Take 1 tablet (500 mg total) by mouth 2 (two) times daily.   rosuvastatin (CRESTOR) 40 MG tablet Take 1 tablet (40 mg total) by mouth daily.   silodosin (RAPAFLO) 8 MG CAPS capsule Take 1 capsule (8 mg total) by mouth at bedtime.   vitamin B-12 (CYANOCOBALAMIN) 1000 MCG tablet Take 1,000 mcg by mouth daily.   No facility-administered encounter medications on file  as of 11/03/2022.    ALLERGIES: No Known Allergies  VACCINATION STATUS: Immunization History  Administered Date(s) Administered   Influenza Split 12/15/2013   Influenza, Seasonal, Injecte, Preservative Fre 10/23/2022   Influenza,inj,Quad PF,6+ Mos 10/26/2014, 10/14/2015, 10/13/2017, 10/18/2018, 10/20/2019, 10/21/2020, 10/21/2021   Influenza-Unspecified 09/26/2016   Moderna Sars-Covid-2 Vaccination 04/28/2019, 05/30/2019   PNEUMOCOCCAL CONJUGATE-20 10/21/2021   Pneumococcal Polysaccharide-23 10/14/2015   Tdap 10/26/2014    Diabetes He presents for his follow-up diabetic visit. He has type 2 diabetes mellitus. His disease course has been fluctuating. There are no hypoglycemic associated symptoms. Pertinent negatives for hypoglycemia include no confusion, headaches, pallor or seizures. Pertinent negatives for diabetes include no chest pain, no fatigue, no polydipsia, no polyphagia, no polyuria and no weakness. Symptoms are improving. Diabetic complications include heart disease. (In the interim , he developed chest pain, hospitalized and diagnosed with ST Elevation MI. He is s/p stent placement.) Risk factors for coronary artery disease include dyslipidemia, diabetes mellitus, male sex, family history and tobacco exposure. Current diabetic treatment includes oral agent (triple therapy). His weight is fluctuating minimally. He is following a generally unhealthy diet. When asked about meal planning, he reported none. He has not  had a previous visit with a dietitian. He rarely participates in exercise. His home blood glucose trend is fluctuating minimally. His breakfast blood glucose range is generally 140-180 mg/dl. His lunch blood glucose range is generally 140-180 mg/dl. His dinner blood glucose range is generally 140-180 mg/dl. His bedtime blood glucose range is generally 140-180 mg/dl. His overall blood glucose range is 140-180 mg/dl. (He uses freestyle libre device.  AGP report shows 42% time in range, 54% level 1 hyperglycemia, 4% level 2 hyperglycemia.  He does not have hypoglycemia.  His point-of-care A1c is 6.6%, improving.  However, his most recent 2 weeks average blood glucose is 185 mg per DL. )  Hyperlipidemia This is a chronic problem. The current episode started more than 1 year ago. The problem is uncontrolled. Exacerbating diseases include diabetes. Pertinent negatives include no chest pain, myalgias or shortness of breath. Current antihyperlipidemic treatment includes statins. Risk factors for coronary artery disease include diabetes mellitus, dyslipidemia, male sex and family history.  Hypertension This is a chronic problem. The current episode started more than 1 year ago. The problem is controlled. Pertinent negatives include no chest pain, headaches, neck pain, palpitations or shortness of breath. Risk factors for coronary artery disease include family history, dyslipidemia, diabetes mellitus, male gender and smoking/tobacco exposure. Past treatments include calcium channel blockers. Hypertensive end-organ damage includes CAD/MI.    Objective:    BP 116/64   Pulse 76   Ht 6\' 1"  (1.854 m)   Wt 203 lb 9.6 oz (92.4 kg)   BMI 26.86 kg/m   Wt Readings from Last 3 Encounters:  11/03/22 203 lb 9.6 oz (92.4 kg)  10/23/22 199 lb 9.6 oz (90.5 kg)  06/25/22 209 lb 9.6 oz (95.1 kg)     CMP ( most recent) CMP     Component Value Date/Time   NA 144 10/08/2022 1044   NA 138 10/04/2013 2025   K 4.3  10/08/2022 1044   K 3.0 (L) 10/04/2013 2025   CL 106 10/08/2022 1044   CL 104 10/04/2013 2025   CO2 22 10/08/2022 1044   CO2 25 10/04/2013 2025   GLUCOSE 171 (H) 10/08/2022 1044   GLUCOSE 117 (H) 08/18/2021 1613   GLUCOSE 175 (H) 10/04/2013 2025   BUN 11 10/08/2022 1044   BUN 13 10/04/2013  2025   CREATININE 1.16 10/08/2022 1044   CREATININE 1.04 11/16/2019 1505   CALCIUM 9.7 10/08/2022 1044   CALCIUM 9.2 10/04/2013 2025   PROT 6.8 10/08/2022 1044   PROT 7.4 10/04/2013 2025   ALBUMIN 4.8 10/08/2022 1044   ALBUMIN 4.1 10/04/2013 2025   AST 16 10/08/2022 1044   AST 8 (L) 10/04/2013 2025   ALT 22 10/08/2022 1044   ALT 19 10/04/2013 2025   ALKPHOS 89 10/08/2022 1044   ALKPHOS 114 10/04/2013 2025   BILITOT 0.8 10/08/2022 1044   BILITOT 0.4 10/04/2013 2025   GFRNONAA >60 06/30/2021 0857   GFRNONAA 83 11/16/2019 1505   GFRAA 96 11/16/2019 1505     Diabetic Labs (most recent): Lab Results  Component Value Date   HGBA1C 6.6 11/03/2022   HGBA1C 6.5 06/25/2022   HGBA1C 7.4 (A) 03/09/2022   MICROALBUR 46.3 (H) 10/22/2021   MICROALBUR 30 05/22/2020   MICROALBUR 2.6 10/20/2019     Lipid Panel ( most recent) Lipid Panel     Component Value Date/Time   CHOL 133 10/08/2022 1044   CHOL 180 03/23/2013 0422   TRIG 188 (H) 10/08/2022 1044   TRIG 218 (H) 03/23/2013 0422   HDL 34 (L) 10/08/2022 1044   HDL 21 (L) 03/23/2013 0422   CHOLHDL 3.9 10/08/2022 1044   CHOLHDL 3.5 08/18/2021 1613   VLDL 31 03/28/2021 0852   VLDL 44 (H) 03/23/2013 0422   LDLCALC 67 10/08/2022 1044   LDLCALC 46 08/18/2021 1613   LDLCALC 115 (H) 03/23/2013 0422   LDLDIRECT 176.0 10/18/2018 1406   LABVLDL 32 10/08/2022 1044      Lab Results  Component Value Date   TSH 1.49 08/18/2021   TSH 1.32 11/16/2019   TSH 1.38 10/18/2018   TSH 1.07 06/11/2016   TSH 1.66 03/12/2014   TSH 1.140 11/24/2013   TSH 1.42 02/25/2012   FREET4 1.2 11/16/2019   FREET4 1.00 04/23/2014      Assessment & Plan:    1. Uncontrolled type 2 diabetes mellitus with CAD - FLEMING MATEL has currently uncontrolled symptomatic type 2 DM since  52 years of age.  He uses freestyle libre device.  AGP report shows 42% time in range, 54% level 1 hyperglycemia, 4% level 2 hyperglycemia.  He does not have hypoglycemia.  His point-of-care A1c is 6.6%, improving.  However, his most recent 2 weeks average blood glucose is 185 mg per DL.      Recent labs reviewed, showing normal renal function. - I had a long discussion with him about the progressive nature of diabetes and the pathology behind its complications. -his diabetes is complicated by CAD s/p stent placement, smoking and he remains at a high risk for more acute and chronic complications which include CVA, CKD, retinopathy, and neuropathy. These are all discussed in detail with him.  - I have counseled him on diet  and weight management  by adopting a carbohydrate restricted/protein rich diet. Patient is encouraged to switch to  unprocessed or minimally processed     complex starch and increased protein intake (animal or plant source), fruits, and vegetables. -  he is advised to stick to a routine mealtimes to eat 3 meals  a day and avoid unnecessary snacks ( to snack only to correct hypoglycemia).   - he acknowledges that there is a room for improvement in his food and drink choices. - Suggestion is made for him to avoid simple carbohydrates  from his diet including Cakes, Sweet  Desserts, Ice Cream, Soda (diet and regular), Sweet Tea, Candies, Chips, Cookies, Store Bought Juices, Alcohol , Artificial Sweeteners,  Coffee Creamer, and "Sugar-free" Products, Lemonade. This will help patient to have more stable blood glucose profile and potentially avoid unintended weight gain.  The following Lifestyle Medicine recommendations according to American College of Lifestyle Medicine  [facility]) were discussed and and offered to patient and he  agrees to start the journey:  A.  Whole Foods, Plant-Based Nutrition comprising of fruits and vegetables, plant-based proteins, whole-grain carbohydrates was discussed in detail with the patient.   A list for source of those nutrients were also provided to the patient.  Patient will use only water or unsweetened tea for hydration. B.  The need to stay away from risky substances including alcohol, smoking; obtaining 7 to 9 hours of restorative sleep, at least 150 minutes of moderate intensity exercise weekly, the importance of healthy social connections,  and stress management techniques were discussed. C.  A full color page of  Calorie density of various food groups per pound showing examples of each food groups was provided to the patient.   - he will be scheduled with Norm Salt, RDN, CDE for diabetes education.  - I have approached him with the following individualized plan to manage  his diabetes and patient agrees:   -He will not need insulin treatment for now.  His insurance did not provide coverage for [place].  He is responding to his current regimen of glipizide and metformin.  However, given his cardiovascular history, he will benefit from GLP-1 receptor agonist.  I discussed initiated Mounjaro 2.5 mg subcutaneously weekly.  Side effects and precautions discussed with him.  If he tolerates this medication, will be advanced slowly.    -In the meantime, he is advised to continue metformin 850 mg p.o. twice daily, and glipizide 5 mg XL p.o. daily at breakfast.   -He will be switched to glimepiride 1 mg p.o. daily at breakfast after he finishes his current supplies of glipizide.  He is advised to continue to use his CGM device.  He is advised to call clinic for hypoglycemia below 70 or hyperglycemia above 200 mg per DL weekly average. - Specific targets for  A1c;  LDL, HDL, Triglycerides, and  Waist Circumference were discussed with the patient.  2) Blood Pressure /Hypertension: -His blood pressure is controlled to  target.  He is advised to continue metoprolol 25 mg p.o. twice daily and lisinopril 20 mg p.o. daily.  During his next visit, he will be considered for stopping metoprolol.      3) Lipids/Hyperlipidemia:   Review of his recent repeat lipid panel showed controlled LDL at 67.   He is advised to continue Crestor 20 mg p.o. nightly.     Side effects and precautions discussed with him.     4)  Weight/Diet:  Body mass index is 26.86 kg/m.  He is not a candidate for major weight loss.  Exercise, and detailed carbohydrates information provided  -  detailed on discharge instructions.  5) Chronic Care/Health Maintenance:  -he  is on  Statin medications and  is encouraged to initiate and continue to follow up with Ophthalmology, Dentist,  Podiatrist at least yearly or according to recommendations, and has successfully quit smoking.    -  I have recommended yearly flu vaccine and pneumonia vaccine at least every 5 years; moderate intensity exercise for up to 150 minutes weekly; and  sleep for at least 7 hours a day.  -  he is  advised to maintain close follow up with [name], MD for primary care needs, as well as his other providers for optimal and coordinated care.   I spent  26  minutes in the care of the patient today including review of labs from CMP, Lipids, Thyroid Function, Hematology (current and previous including abstractions from other facilities); face-to-face time discussing  his blood glucose readings/logs, discussing hypoglycemia and hyperglycemia episodes and symptoms, medications doses, his options of short and long term treatment based on the latest standards of care / guidelines;  discussion about incorporating lifestyle medicine;  and documenting the encounter. Risk reduction counseling performed per USPSTF guidelines to reduce  and cardiovascular risk factors.     Please refer to Patient Instructions for Blood Glucose Monitoring and Insulin/Medications Dosing Guide"  in media  tab for additional information. Please  also refer to " Patient Self Inventory" in the Media  tab for reviewed elements of pertinent patient history.  Jacob Baker participated in the discussions, expressed understanding, and voiced agreement with the above plans.  All questions were answered to his satisfaction. he is encouraged to contact clinic should he have any questions or concerns prior to his return visit.    Follow up plan: - Return in about 4 months (around 03/06/2023) for Bring Meter/CGM Device/Logs- A1c in Office.  Marquis Lunch, MD [facility] [address] [place], [place] [address] Phone: [phone]  Fax: [phone]    11/03/2022, 5:04 PM  This note was partially dictated with voice recognition software. Similar sounding words can be transcribed inadequately or may not  be corrected upon review.

## 2022-11-03 NOTE — Patient Instructions (Signed)

## 2022-11-07 ENCOUNTER — Other Ambulatory Visit: Payer: Self-pay | Admitting: "Endocrinology

## 2022-11-11 ENCOUNTER — Other Ambulatory Visit: Payer: Self-pay | Admitting: Internal Medicine

## 2022-11-20 ENCOUNTER — Encounter: Payer: Self-pay | Admitting: Cardiology

## 2022-11-20 ENCOUNTER — Ambulatory Visit: Payer: BLUE CROSS/BLUE SHIELD | Attending: Cardiology | Admitting: Cardiology

## 2022-11-20 VITALS — BP 132/84 | HR 74 | Ht 73.0 in | Wt 208.0 lb

## 2022-11-20 DIAGNOSIS — E782 Mixed hyperlipidemia: Secondary | ICD-10-CM

## 2022-11-20 DIAGNOSIS — R0602 Shortness of breath: Secondary | ICD-10-CM | POA: Diagnosis not present

## 2022-11-20 DIAGNOSIS — I25118 Atherosclerotic heart disease of native coronary artery with other forms of angina pectoris: Secondary | ICD-10-CM | POA: Diagnosis not present

## 2022-11-20 DIAGNOSIS — I10 Essential (primary) hypertension: Secondary | ICD-10-CM | POA: Diagnosis not present

## 2022-11-20 MED ORDER — LOSARTAN POTASSIUM 25 MG PO TABS
12.5000 mg | ORAL_TABLET | Freq: Every day | ORAL | 3 refills | Status: AC
Start: 2022-11-20 — End: ?

## 2022-11-20 NOTE — Progress Notes (Signed)
Clinical Summary Jacob Baker is a 52 y.o.male seen today for follow up of the following medical problems.    1.CAD - NSTEMI 12/2020 - 12/2020 cath: LAD LIs, D1 99%, LCX prox 85%, RCA prox to mid 85% - DES to D1, staged DES to RCA. Residual LCX disease managed medically 12/2020 echo:LVEF 65-70%   07/2021 nuclear stress: normal perfusion -intolerant to imdur   - chest pains at rest,  sharp pain left sided. 7-8/10 in severity. No other associated symptoms. Not positional. 1-2 times per month - compliant with meds - reports had stopped his brillinta at 1 year but had some chest pains and restarted taking.     - some recent chest pains - dull/sharp left sided, 3-4/10 in severity. No other associated symptoms. Can occur at rest or with activity. Not positional. Lasts 15-20 seconds. Random frequency. Started bout 1-2 weeks. Stable since onset.  - some SOB with acitivities which is new.  Some LE edema at times.     2.HTN -compliant with meds  - lisinopril caused cough, he stopped taking   3. Hyperlipidemia - 02/2022 TC 118 TG 195 HDL 32 LDL 54 09/2022 TC 133 TG 188 HDL 34 LDL 67     3. Prior DVT   4. Restrictive lung disease Past Medical History:  Diagnosis Date   (HFpEF) heart failure with preserved ejection fraction (HCC)    a. 12/2020 Echo: EF 65-70%, no rwma, mild LVH, nl RV fxn, triv MR.   CAD (coronary artery disease)    a. 12/2020 NSTEMI/PCI: LM nl, LAD min irregs, D1 99 (2.0x12 Onyx Frontier DES), LCX 85p (small), RCA 80p/m (4.0x18 Onyx Frontier DES - staged following day), 60d, RPDA mod dzs. EF 50-55%.   DVT (deep venous thrombosis) (HCC) 04/2017   GERD (gastroesophageal reflux disease)    History of migraine    Hyperlipidemia    Hypertension    Long term current use of antithrombotics/antiplatelets    a.) on daily DAPT therapy (ticagrelor + ASA)   Non-STEMI (non-ST elevated myocardial infarction) (HCC) 12/30/2020   Syncope and collapse    a.) single  episode; related to dehydration   T2DM (type 2 diabetes mellitus) (HCC)      No Known Allergies   Current Outpatient Medications  Medication Sig Dispense Refill   albuterol (VENTOLIN HFA) 108 (90 Base) MCG/ACT inhaler Inhale 2 puffs into the lungs every 6 (six) hours as needed for wheezing or shortness of breath. 8 g 1   amLODipine (NORVASC) 10 MG tablet Take 1 tablet (10 mg total) by mouth daily. 90 tablet 1   aspirin 81 MG EC tablet Take 1 tablet (81 mg total) by mouth daily with breakfast. Swallow whole. 90 tablet 3   carvedilol (COREG) 3.125 MG tablet Take 1 tablet (3.125 mg total) by mouth 2 (two) times daily with a meal. 60 tablet 3   Continuous Glucose Sensor (FREESTYLE LIBRE 3 SENSOR) MISC PLACE 1 SENSOR ON THE SKIN EVERY 14 DAYS. USE TO CHECK GLUCOSE CONTINUOUSLY 6 each 1   glipiZIDE (GLUCOTROL XL) 5 MG 24 hr tablet Take 1 tablet (5 mg total) by mouth daily with breakfast. 90 tablet 1   glucose blood (ONETOUCH VERIO) test strip Test sugars twice daily 50 each 6   lisinopril (ZESTRIL) 5 MG tablet Take 1 tablet (5 mg total) by mouth daily. 90 tablet 3   metFORMIN (GLUCOPHAGE) 850 MG tablet TAKE 1 TABLET(850 MG) BY MOUTH TWICE DAILY WITH A MEAL 180 tablet  1   nitroGLYCERIN (NITROSTAT) 0.4 MG SL tablet Place 1 tablet (0.4 mg total) under the tongue every 5 (five) minutes as needed for chest pain. 25 tablet 1   ONETOUCH DELICA LANCETS 33G MISC Test sugars twice daily 50 each 3   pantoprazole (PROTONIX) 40 MG tablet TAKE 1 TABLET BY MOUTH DAILY 90 tablet 3   ranolazine (RANEXA) 500 MG 12 hr tablet Take 1 tablet (500 mg total) by mouth 2 (two) times daily. 180 tablet 3   rosuvastatin (CRESTOR) 40 MG tablet Take 1 tablet (40 mg total) by mouth daily. 90 tablet 1   silodosin (RAPAFLO) 8 MG CAPS capsule Take 1 capsule (8 mg total) by mouth at bedtime. 30 capsule 11   tirzepatide (MOUNJARO) 2.5 MG/0.5ML Pen INJECT 2.5 MG SUBCUTANEOUSLY WEEKLY 2 mL 1   vitamin B-12 (CYANOCOBALAMIN) 1000 MCG  tablet Take 1,000 mcg by mouth daily.     No current facility-administered medications for this visit.     Past Surgical History:  Procedure Laterality Date   CHOLECYSTECTOMY     COLONOSCOPY     COLONOSCOPY WITH PROPOFOL N/A 08/06/2017   Procedure: COLONOSCOPY WITH PROPOFOL;  Surgeon: Midge Minium, MD;  Location: [facility];  Service: Endoscopy;  Laterality: N/A;  Diabetic - oral meds   CORONARY STENT INTERVENTION N/A 12/31/2020   Procedure: CORONARY STENT INTERVENTION;  Surgeon: Lyn Records, MD;  Location: [facility] INVASIVE CV LAB;  Service: Cardiovascular;  Laterality: N/A;   CORONARY STENT INTERVENTION N/A 01/01/2021   Procedure: CORONARY STENT INTERVENTION;  Surgeon: Corky Crafts, MD;  Location: [facility] INVASIVE CV LAB;  Service: Cardiovascular;  Laterality: N/A;   ESOPHAGEAL DILATION  09/24/2016   Procedure: ESOPHAGEAL DILATION;  Surgeon: Midge Minium, MD;  Location: [facility];  Service: Gastroenterology;;   ESOPHAGOGASTRODUODENOSCOPY (EGD) WITH PROPOFOL N/A 04/08/2015   Procedure: ESOPHAGOGASTRODUODENOSCOPY (EGD) WITH PROPOFOL with dialation;  Surgeon: Midge Minium, MD;  Location: [facility];  Service: Endoscopy;  Laterality: N/A;  diabetic - oral meds   ESOPHAGOGASTRODUODENOSCOPY (EGD) WITH PROPOFOL N/A 09/24/2016   Procedure: ESOPHAGOGASTRODUODENOSCOPY (EGD) WITH PROPOFOL WITH DILATION;  Surgeon: Midge Minium, MD;  Location: [facility];  Service: Gastroenterology;  Laterality: N/A;  Diabetic - oral meds   FINGER AMPUTATION  01/27/2007   partial removal left index finger    LEFT HEART CATH AND CORONARY ANGIOGRAPHY N/A 12/31/2020   Procedure: LEFT HEART CATH AND CORONARY ANGIOGRAPHY;  Surgeon: Lyn Records, MD;  Location: [facility] INVASIVE CV LAB;  Service: Cardiovascular;  Laterality: N/A;   LEFT HEART CATH AND CORONARY ANGIOGRAPHY N/A 01/01/2021   Procedure: LEFT HEART CATH AND CORONARY ANGIOGRAPHY;  Surgeon: Corky Crafts, MD;  Location: [facility]  INVASIVE CV LAB;  Service: Cardiovascular;  Laterality: N/A;   POLYPECTOMY  08/06/2017   Procedure: POLYPECTOMY INTESTINAL;  Surgeon: Midge Minium, MD;  Location: [facility];  Service: Endoscopy;;     No Known Allergies    Family History  Problem Relation Age of Onset   Hypertension Mother    Heart disease Father        CAD   Hypertension Father    Diabetes Father    Hypertension Sister    Hypertension Brother    Hypertension Brother      Social History Mr. Loudin reports that he quit smoking about 22 months ago. His smoking use included cigarettes. He started smoking about 28 years ago. He has a 54 pack-year smoking history. He has never been exposed to tobacco smoke. He  has never used smokeless tobacco. Mr. Westly reports no history of alcohol use.   Review of Systems CONSTITUTIONAL: No weight loss, fever, chills, weakness or fatigue.  HEENT: Eyes: No visual loss, blurred vision, double vision or yellow sclerae.No hearing loss, sneezing, congestion, runny nose or sore throat.  SKIN: No rash or itching.  CARDIOVASCULAR: per hpi RESPIRATORY: per hpi GASTROINTESTINAL: No anorexia, nausea, vomiting or diarrhea. No abdominal pain or blood.  GENITOURINARY: No burning on urination, no polyuria NEUROLOGICAL: No headache, dizziness, syncope, paralysis, ataxia, numbness or tingling in the extremities. No change in bowel or bladder control.  MUSCULOSKELETAL: No muscle, back pain, joint pain or stiffness.  LYMPHATICS: No enlarged nodes. No history of splenectomy.  PSYCHIATRIC: No history of depression or anxiety.  ENDOCRINOLOGIC: No reports of sweating, cold or heat intolerance. No polyuria or polydipsia.  Marland Kitchen   Physical Examination Today's Vitals   11/20/22 1535  BP: 132/84  Pulse: 74  SpO2: 96%  Weight: 208 lb (94.3 kg)  Height: 6\' 1"  (1.854 m)   Body mass index is 27.44 kg/m.  Gen: resting comfortably, no acute distress HEENT: no scleral icterus, pupils equal  round and reactive, no palptable cervical adenopathy,  CV: RRR, no mr/g, no jvd Resp: Clear to auscultation bilaterally GI: abdomen is soft, non-tender, non-distended, normal bowel sounds, no hepatosplenomegaly MSK: extremities are warm, no edema.  Skin: warm, no rash Neuro:  no focal deficits Psych: appropriate affect   Diagnostic Studies  12/2020 echo 1. Left ventricular ejection fraction, by estimation, is 65 to 70%. The  left ventricle has normal function. The left ventricle has no regional  wall motion abnormalities. There is mild left ventricular hypertrophy.  Left ventricular diastolic parameters  were normal.   2. Right ventricular systolic function is normal. The right ventricular  size is normal. Tricuspid regurgitation signal is inadequate for assessing  PA pressure.   3. The mitral valve is normal in structure. Trivial mitral valve  regurgitation. No evidence of mitral stenosis.   4. The aortic valve is tricuspid. Aortic valve regurgitation is not  visualized. No aortic stenosis is present.   5. The inferior vena cava is normal in size with greater than 50%  respiratory variability, suggesting right atrial pressure of 3 mmHg.    12/2020 cath CONCLUSIONS: High-grade stenosis in the large mid diagonal Mahika Vanvoorhis 99% reduced to 0% with TIMI-3 flow using a 2.0 x 12 Onyx frontier.  Chest discomfort was dissimilar to presenting complaint. Left main is normal LAD has mild diffuse disease Circumflex contains 80 to 90% proximal stenosis.  The vessel is diffusely diseased and gives origin to 2-2 small obtuse marginal branches.  If symptoms continue, perhaps this proximal lesion should be stented. RCA has a "rosary bead "appearance.  It is a large vessel.  There is 60% mid, 60% distal, diffuse disease in the PDA and LV branches. Left ventriculography demonstrated mid anterior wall hypokinesis.  EF 50%.  LVEDP is normal.   RECOMMENDATIONS:   Aggressive risk factor  modification. Smoking cessation. Monitor for recurrent symptoms and consider PCI of circumflex if symptoms do not abate with PCI of this moderate-sized diagonal Audrinna Sherman   12/2020 staged PCI  Prox Cx lesion is 85% stenosed.  This was a small vessel that was diffusely diseased.   Prox RCA to Mid RCA lesion is 80% stenosed.   A drug-eluting stent was successfully placed using a STENT ONYX FRONTIER 4.0X18, posdilated to 4.5 mm.   Post intervention, there is a 0% residual stenosis.  Dist RCA lesion is 50% stenosed.   Diagonal stent was widely patent.   LV end diastolic pressure is normal.   There is no aortic valve stenosis.   Successful PCI of RCA.  He needs aggressive secondary prevention, smoking cessation.  Would consider changing Plavix to Brilinta since he had ACS.        Assessment and Plan   1.CAD - atypical chest pains, recent nuclear stress test without ischemia  - some recent DOE, will check echo - had some residual disease on prior cath. If significant progression of symptoms may warrant repeat cath.    2. HTN - at goal Cough on lisinopril, will start losartan 12.5mg  daily.    3. Hyperlipidemia - LDL is at goal, continue current meds    Antoine Poche, M.D.

## 2022-11-20 NOTE — Patient Instructions (Signed)
Medication Instructions:  Your physician has recommended you make the following change in your medication:  -Stop Lisinopril -Start Losartan 12.5 mg once daily   Labwork: None  Testing/Procedures: Your physician has requested that you have an echocardiogram. Echocardiography is a painless test that uses sound waves to create images of your heart. It provides your doctor with information about the size and shape of your heart and how well your heart's chambers and valves are working. This procedure takes approximately one hour. There are no restrictions for this procedure. Please do NOT wear cologne, perfume, aftershave, or lotions (deodorant is allowed). Please arrive 15 minutes prior to your appointment time.   Follow-Up: Follow up in 4 weeks with an APP.   Any Other Special Instructions Will Be Listed Below (If Applicable).     If you need a refill on your cardiac medications before your next appointment, please call your pharmacy.

## 2022-11-25 ENCOUNTER — Ambulatory Visit: Payer: BLUE CROSS/BLUE SHIELD | Admitting: Urology

## 2022-12-02 ENCOUNTER — Other Ambulatory Visit: Payer: Self-pay | Admitting: Internal Medicine

## 2022-12-03 ENCOUNTER — Ambulatory Visit: Payer: BLUE CROSS/BLUE SHIELD | Admitting: Urology

## 2022-12-03 DIAGNOSIS — N401 Enlarged prostate with lower urinary tract symptoms: Secondary | ICD-10-CM | POA: Insufficient documentation

## 2022-12-03 NOTE — Progress Notes (Deleted)
Name: Jacob Baker DOB: 24-Oct-1970 MRN: [id number]  History of Present Illness: Jacob Baker is a 51 y.o. male who presents today for return visit at [facility]. - GU history: 1. BPH with LUTS (nocturia). PSA values have been normal. 2. Hypogonadism.  At last visit with Dr. Ronne Binning on 07/29/2022: Uroxatral increased that to 10 mg 2x/day.  Since last visit: 09/14/2022: Patient messaged Dr. Ronne Binning requesting alternative medication due to inefficacy of Uroxatral 10 mg 2x/day. He also questioned Uroxatral as possible cause of some chest pain. Rapaflo 8 mg daily was prescribed as alternative.   Today: He reports ***  He reports *** urinary stream. He {Actions; denies-reports:120008} urinary hesitancy, urgency, frequency, dysuria, gross hematuria, straining to void, or sensations of incomplete emptying.   Fall Screening: Do you usually have a device to assist in your mobility? {yes/no:20286} ***cane / ***walker / ***wheelchair   Medications: Current Outpatient Medications  Medication Sig Dispense Refill   albuterol (VENTOLIN HFA) 108 (90 Base) MCG/ACT inhaler Inhale 2 puffs into the lungs every 6 (six) hours as needed for wheezing or shortness of breath. 8 g 1   amLODipine (NORVASC) 10 MG tablet TAKE 1 TABLET(10 MG) BY MOUTH DAILY 90 tablet 1   aspirin 81 MG EC tablet Take 1 tablet (81 mg total) by mouth daily with breakfast. Swallow whole. 90 tablet 3   carvedilol (COREG) 3.125 MG tablet Take 1 tablet (3.125 mg total) by mouth 2 (two) times daily with a meal. 60 tablet 3   Continuous Glucose Sensor (FREESTYLE LIBRE 3 SENSOR) MISC PLACE 1 SENSOR ON THE SKIN EVERY 14 DAYS. USE TO CHECK GLUCOSE CONTINUOUSLY 6 each 1   glipiZIDE (GLUCOTROL XL) 5 MG 24 hr tablet Take 1 tablet (5 mg total) by mouth daily with breakfast. 90 tablet 1   glucose blood (ONETOUCH VERIO) test strip Test sugars twice daily 50 each 6   losartan (COZAAR) 25 MG tablet Take 0.5 tablets (12.5 mg  total) by mouth daily. 45 tablet 3   metFORMIN (GLUCOPHAGE) 850 MG tablet TAKE 1 TABLET(850 MG) BY MOUTH TWICE DAILY WITH A MEAL 180 tablet 1   nitroGLYCERIN (NITROSTAT) 0.4 MG SL tablet Place 1 tablet (0.4 mg total) under the tongue every 5 (five) minutes as needed for chest pain. 25 tablet 1   ONETOUCH DELICA LANCETS 33G MISC Test sugars twice daily 50 each 3   pantoprazole (PROTONIX) 40 MG tablet TAKE 1 TABLET BY MOUTH DAILY 90 tablet 3   ranolazine (RANEXA) 500 MG 12 hr tablet Take 1 tablet (500 mg total) by mouth 2 (two) times daily. 180 tablet 3   rosuvastatin (CRESTOR) 40 MG tablet Take 1 tablet (40 mg total) by mouth daily. 90 tablet 1   silodosin (RAPAFLO) 8 MG CAPS capsule Take 1 capsule (8 mg total) by mouth at bedtime. (Patient not taking: Reported on 11/20/2022) 30 capsule 11   tirzepatide (MOUNJARO) 2.5 MG/0.5ML Pen INJECT 2.5 MG SUBCUTANEOUSLY WEEKLY (Patient not taking: Reported on 11/20/2022) 2 mL 1   vitamin B-12 (CYANOCOBALAMIN) 1000 MCG tablet Take 1,000 mcg by mouth daily.     No current facility-administered medications for this visit.    Allergies: No Known Allergies  Past Medical History:  Diagnosis Date   (HFpEF) heart failure with preserved ejection fraction (HCC)    a. 12/2020 Echo: EF 65-70%, no rwma, mild LVH, nl RV fxn, triv MR.   CAD (coronary artery disease)    a. 12/2020 NSTEMI/PCI: LM nl, LAD min  irregs, D1 99 (2.0x12 Onyx Frontier DES), LCX 85p (small), RCA 80p/m (4.0x18 Onyx Frontier DES - staged following day), 60d, RPDA mod dzs. EF 50-55%.   DVT (deep venous thrombosis) (HCC) 04/2017   GERD (gastroesophageal reflux disease)    History of migraine    Hyperlipidemia    Hypertension    Long term current use of antithrombotics/antiplatelets    a.) on daily DAPT therapy (ticagrelor + ASA)   Non-STEMI (non-ST elevated myocardial infarction) (HCC) 12/30/2020   Syncope and collapse    a.) single episode; related to dehydration   T2DM (type 2 diabetes  mellitus) (HCC)    Past Surgical History:  Procedure Laterality Date   CHOLECYSTECTOMY     COLONOSCOPY     COLONOSCOPY WITH PROPOFOL N/A 08/06/2017   Procedure: COLONOSCOPY WITH PROPOFOL;  Surgeon: Midge Minium, MD;  Location: [facility];  Service: Endoscopy;  Laterality: N/A;  Diabetic - oral meds   CORONARY STENT INTERVENTION N/A 12/31/2020   Procedure: CORONARY STENT INTERVENTION;  Surgeon: Lyn Records, MD;  Location: [facility] INVASIVE CV LAB;  Service: Cardiovascular;  Laterality: N/A;   CORONARY STENT INTERVENTION N/A 01/01/2021   Procedure: CORONARY STENT INTERVENTION;  Surgeon: Corky Crafts, MD;  Location: [facility] INVASIVE CV LAB;  Service: Cardiovascular;  Laterality: N/A;   ESOPHAGEAL DILATION  09/24/2016   Procedure: ESOPHAGEAL DILATION;  Surgeon: Midge Minium, MD;  Location: [facility]ERY [facility];  Service: Gastroenterology;;   ESOPHAGOGASTRODUODENOSCOPY (EGD) WITH PROPOFOL N/A 04/08/2015   Procedure: ESOPHAGOGASTRODUODENOSCOPY (EGD) WITH PROPOFOL with dialation;  Surgeon: Midge Minium, MD;  Location: [facility];  Service: Endoscopy;  Laterality: N/A;  diabetic - oral meds   ESOPHAGOGASTRODUODENOSCOPY (EGD) WITH PROPOFOL N/A 09/24/2016   Procedure: ESOPHAGOGASTRODUODENOSCOPY (EGD) WITH PROPOFOL WITH DILATION;  Surgeon: Midge Minium, MD;  Location: [facility];  Service: Gastroenterology;  Laterality: N/A;  Diabetic - oral meds   FINGER AMPUTATION  01/27/2007   partial removal left index finger    LEFT HEART CATH AND CORONARY ANGIOGRAPHY N/A 12/31/2020   Procedure: LEFT HEART CATH AND CORONARY ANGIOGRAPHY;  Surgeon: Lyn Records, MD;  Location: [facility] INVASIVE CV LAB;  Service: Cardiovascular;  Laterality: N/A;   LEFT HEART CATH AND CORONARY ANGIOGRAPHY N/A 01/01/2021   Procedure: LEFT HEART CATH AND CORONARY ANGIOGRAPHY;  Surgeon: Corky Crafts, MD;  Location: [facility] INVASIVE CV LAB;  Service: Cardiovascular;  Laterality: N/A;   POLYPECTOMY  08/06/2017    Procedure: POLYPECTOMY INTESTINAL;  Surgeon: Midge Minium, MD;  Location: [facility];  Service: Endoscopy;;   Family History  Problem Relation Age of Onset   Hypertension Mother    Heart disease Father        CAD   Hypertension Father    Diabetes Father    Hypertension Sister    Hypertension Brother    Hypertension Brother    Social History   Socioeconomic History   Marital status: Married    Spouse name: Carollee Herter   Number of children: 4   Years of education: Not on file   Highest education level: Not on file  Occupational History   Not on file  Tobacco Use   Smoking status: Former    Current packs/day: 0.00    Average packs/day: 2.0 packs/day for 27.0 years (54.0 ttl pk-yrs)    Types: Cigarettes    Start date: 12/30/1993    Quit date: 12/30/2020    Years since quitting: 1.9    Passive exposure: Never   Smokeless tobacco: Never  Vaping  Use   Vaping status: Never Used  Substance and Sexual Activity   Alcohol use: No   Drug use: No   Sexual activity: Not on file  Other Topics Concern   Not on file  Social History Narrative   Not on file   Social Determinants of Health   Financial Resource Strain: Not on file  Food Insecurity: Not on file  Transportation Needs: Not on file  Physical Activity: Not on file  Stress: Not on file  Social Connections: Not on file  Intimate Partner Violence: Not on file    Review of Systems Constitutional: Patient denies any unintentional weight loss or change in strength lntegumentary: Patient denies any rashes or pruritus Eyes: Patient {Actions; denies-reports:120008} dry eyes ENT: Patient {Actions; denies-reports:120008} dry mouth Cardiovascular: Patient denies chest pain or syncope Respiratory: Patient denies shortness of breath Gastrointestinal: Patient ***denies nausea, vomiting, constipation, or diarrhea Musculoskeletal: Patient denies muscle cramps or weakness Neurologic: Patient denies convulsions or  seizures Allergic/Immunologic: Patient denies recent allergic reaction(s) Hematologic/Lymphatic: Patient denies bleeding tendencies Endocrine: Patient denies heat/cold intolerance  GU: As per HPI.  OBJECTIVE There were no vitals filed for this visit. There is no height or weight on file to calculate BMI.  Physical Examination*** Constitutional: No obvious distress; patient is non-toxic appearing  Cardiovascular: No visible lower extremity edema.  Respiratory: The patient does not have audible wheezing/stridor; respirations do not appear labored  Gastrointestinal: Abdomen non-distended Musculoskeletal: Normal ROM of UEs  Skin: No obvious rashes/open sores  Neurologic: CN 2-12 grossly intact Psychiatric: Answered questions appropriately with normal affect  Hematologic/Lymphatic/Immunologic: No obvious bruises or sites of spontaneous bleeding  UA: ***negative *** WBC/hpf, *** RBC/hpf, bacteria (***) PVR: *** ml  ASSESSMENT No diagnosis found. ***  Will plan for follow up in *** months / ***1 year or sooner if needed. Pt verbalized understanding and agreement. All questions were answered.  PLAN Advised the following: 1. *** 2. ***No follow-ups on file.  No orders of the defined types were placed in this encounter.   It has been explained that the patient is to follow regularly with their PCP in addition to all other providers involved in their care and to follow instructions provided by these respective offices. Patient advised to contact urology clinic if any urologic-pertaining questions, concerns, new symptoms or problems arise in the interim period.  There are no Patient Instructions on file for this visit.  Electronically signed by:  Donnita Falls, FNP   12/03/22    1:44 PM

## 2022-12-09 ENCOUNTER — Ambulatory Visit: Payer: BLUE CROSS/BLUE SHIELD | Attending: Cardiology

## 2022-12-09 DIAGNOSIS — R0602 Shortness of breath: Secondary | ICD-10-CM

## 2022-12-10 ENCOUNTER — Other Ambulatory Visit: Payer: Self-pay | Admitting: Student

## 2022-12-10 LAB — ECHOCARDIOGRAM COMPLETE
AR max vel: 3.84 cm2
AV Area VTI: 3.46 cm2
AV Area mean vel: 3.79 cm2
AV Mean grad: 4 mm[Hg]
AV Peak grad: 7.6 mm[Hg]
Ao pk vel: 1.38 m/s
Area-P 1/2: 3.79 cm2
Calc EF: 68.7 %
MV VTI: 5.17 cm2
S' Lateral: 2.5 cm
Single Plane A2C EF: 75.3 %
Single Plane A4C EF: 63.8 %

## 2022-12-14 ENCOUNTER — Other Ambulatory Visit: Payer: Self-pay | Admitting: Cardiology

## 2022-12-20 ENCOUNTER — Other Ambulatory Visit: Payer: Self-pay | Admitting: Internal Medicine

## 2022-12-23 ENCOUNTER — Encounter: Payer: Self-pay | Admitting: Nurse Practitioner

## 2022-12-23 ENCOUNTER — Ambulatory Visit: Payer: BLUE CROSS/BLUE SHIELD | Attending: Nurse Practitioner | Admitting: Nurse Practitioner

## 2022-12-23 VITALS — BP 116/68 | HR 76 | Ht 73.0 in | Wt 205.4 lb

## 2022-12-23 DIAGNOSIS — E785 Hyperlipidemia, unspecified: Secondary | ICD-10-CM

## 2022-12-23 DIAGNOSIS — I10 Essential (primary) hypertension: Secondary | ICD-10-CM

## 2022-12-23 DIAGNOSIS — I25119 Atherosclerotic heart disease of native coronary artery with unspecified angina pectoris: Secondary | ICD-10-CM

## 2022-12-23 MED ORDER — NITROGLYCERIN 0.4 MG SL SUBL: 0.4000 mg | SUBLINGUAL_TABLET | SUBLINGUAL | 1 refills | Status: AC | PRN

## 2022-12-23 MED ORDER — RANOLAZINE ER 1000 MG PO TB12: 1000.0000 mg | ORAL_TABLET | Freq: Two times a day (BID) | ORAL | 1 refills | Status: AC

## 2022-12-23 NOTE — Progress Notes (Signed)
Cardiology Office Note:  .   Date:  12/23/2022 ID:  Jacob Baker, DOB 14-Mar-1970, MRN [id number] PCP: [name], MD  [facility] Providers Cardiologist:  Dina Rich, MD    History of Present Illness: .   Jacob Baker is a 52 y.o. male with a PMH of CAD, s/p NSTEMI in 2022, HTN, HLD, T2DM, restrictive lung disease, and prior DVT, who presents today for 4 week follow-up.   Previous cardiovascular history includes NSTEMI in 2022.  See cardiac cath report noted below. At the time received drug-eluting stent to D1, staged DES to RCA. Residual left circumflex disease was medically managed.  EF at that time 65 to 70%.  Last seen by Dr. Dina Rich on November 20, 2022.  Patient had noticed some atypical chest pains, had a recent nuclear stress test that was without ischemia.  Patient noted some DOE.  Echocardiogram was arranged and revealed EF 70 to 75%, no RWMA, no significant valvular abnormalities, no significant change from prior study.  Dr. Wyline Mood recommended that if patient had significant progression of symptoms, may warrant repeat cath.  He had noted a cough on lisinopril, medication was stopped and started on Losartan 12.5 mg daily.   Today he presents for 4-week follow-up.  He states he has had 2 more episodes of chest pain since last office visit. He describes the onset of his chest pain occurring 1 month ago, that occurs along the left side of his chest, denies any radiation, and says taking baby aspirin helps improve his chest pain. Denies any specific triggers or aggravating factors to his chest pain, says episodes are random in occurrence and episodes are brief (lasting around 1 minute) and stable over time. Rates pain as a 7/10 on 0-10 pain scale. Denies any shortness of breath, palpitations, syncope, presyncope, dizziness, orthopnea, PND, swelling or significant weight changes, acute bleeding, or claudication.  ROS: Negative. See HPI.   Studies Reviewed: Marland Kitchen     EKG:  EKG Interpretation Date/Time:  Wednesday December 23 2022 16:24:12 EST Ventricular Rate:  74 PR Interval:  230 QRS Duration:  80 QT Interval:  392 QTC Calculation: 435 R Axis:   31  Text Interpretation: Sinus rhythm with 1st degree A-V block Cannot rule out Anterior infarct , age undetermined T wave abnormality, consider inferior ischemia When compared with ECG of 30-Jun-2021 09:26, No significant change was found Confirmed by Sharlene Dory [phone]) on 12/23/2022 4:26:25 PM   Echo 11/2022:  1. Left ventricular ejection fraction, by estimation, is 70 to 75%. The  left ventricle has hyperdynamic function. The left ventricle has no  regional wall motion abnormalities. There is mild left ventricular  hypertrophy. Left ventricular diastolic  parameters were normal.   2. Right ventricular systolic function is normal. The right ventricular  size is mildly enlarged. Tricuspid regurgitation signal is inadequate for  assessing PA pressure.   3. The mitral valve is normal in structure. Trivial mitral valve  regurgitation. No evidence of mitral stenosis.   4. The aortic valve is tricuspid. Aortic valve regurgitation is not  visualized. No aortic stenosis is present.   5. Aortic dilatation noted. There is borderline dilatation of the aortic  root, measuring 38 mm. Normal size of the ascending aorta.   Comparison(s): No significant change from prior study.  Renal artery duplex bilateral 04/2022:  Normal study. No evidence of renal artery stenosis seen bilaterally.   Lexiscan 07/2021:    The study is normal. The study is low  risk.   No ST deviation was noted.   LV perfusion is normal.   Left ventricular function is normal. Nuclear stress EF: 63 %. The left ventricular ejection fraction is normal (55-65%). End diastolic cavity size is normal.  Coronary stent intervention 12/2020:    Prox Cx lesion is 85% stenosed.  This was a small vessel that was diffusely diseased.   Prox RCA to  Mid RCA lesion is 80% stenosed.   A drug-eluting stent was successfully placed using a STENT ONYX FRONTIER 4.0X18, posdilated to 4.5 mm.   Post intervention, there is a 0% residual stenosis.   Dist RCA lesion is 50% stenosed.   Diagonal stent was widely patent.   LV end diastolic pressure is normal.   There is no aortic valve stenosis.   Successful PCI of RCA.  He needs aggressive secondary prevention, smoking cessation.  Would consider changing Plavix to Brilinta since he had ACS.   LHC 12/2020:  CONCLUSIONS: High-grade stenosis in the large mid diagonal branch 99% reduced to 0% with TIMI-3 flow using a 2.0 x 12 Onyx frontier.  Chest discomfort was dissimilar to presenting complaint. Left main is normal LAD has mild diffuse disease Circumflex contains 80 to 90% proximal stenosis.  The vessel is diffusely diseased and gives origin to 2-2 small obtuse marginal branches.  If symptoms continue, perhaps this proximal lesion should be stented. RCA has a "rosary bead "appearance.  It is a large vessel.  There is 60% mid, 60% distal, diffuse disease in the PDA and LV branches. Left ventriculography demonstrated mid anterior wall hypokinesis.  EF 50%.  LVEDP is normal.   RECOMMENDATIONS:   Aggressive risk factor modification. Smoking cessation. Monitor for recurrent symptoms and consider PCI of circumflex if symptoms do not abate with PCI of this moderate-sized diagonal branch  Physical Exam:   VS:  BP 116/68   Pulse 76   Ht 6\' 1"  (1.854 m)   Wt 205 lb 6.4 oz (93.2 kg)   SpO2 96%   BMI 27.10 kg/m    Wt Readings from Last 3 Encounters:  12/23/22 205 lb 6.4 oz (93.2 kg)  11/20/22 208 lb (94.3 kg)  11/03/22 203 lb 9.6 oz (92.4 kg)    GEN: Well nourished, well developed in no acute distress NECK: No JVD; No carotid bruits CARDIAC: S1/S2, RRR, no murmurs, rubs, gallops RESPIRATORY:  Clear to auscultation without rales, wheezing or rhonchi  ABDOMEN: Soft, non-tender,  non-distended EXTREMITIES:  No edema; No deformity   ASSESSMENT AND PLAN: .    CAD, s/p NSTEMI in 2022 Previous cardiovascular history includes NSTEMI in 2022.  See cardiac cath report noted below. At the time received drug-eluting stent to D1, staged DES to RCA. Residual left circumflex disease was medically managed.  EF at that time 65 to 70%. Admits to some atypical chest pain. Reviewed recent Echo that was benign and did not show WMA. Normal NST in 07/2021. EKG is negative for acute ischemic changes today. Continue current medication regimen. Will increase Ranexa to 1,000 mg BID. Will provide refill of Nitroglycerin and provided education about this medication. Heart healthy diet and regular cardiovascular exercise encouraged. Care and ED precautions discussed.   HTN BP stable. Discussed to monitor BP at home at least 2 hours after medications and sitting for 5-10 minutes. No medication changes at this time. Heart healthy diet and regular cardiovascular exercise encouraged.   HLD LDL 67 09/2022. Continue rosuvastatin. Heart healthy diet and regular cardiovascular exercise encouraged. Continue to  follow with PCP.   Dispo: Follow-up with me/APP in 6 weeks or sooner if anything changes.   Signed, Sharlene Dory, NP

## 2022-12-23 NOTE — Patient Instructions (Addendum)
Medication Instructions:  Your physician has recommended you make the following change in your medication:  Please Increase your Ranexa to 1,000 Twice daily   Labwork: None   Testing/Procedures: None   Follow-Up: Your physician recommends that you schedule a follow-up appointment in: 6 weeks   Any Other Special Instructions Will Be Listed Below (If Applicable).  If you need a refill on your cardiac medications before your next appointment, please call your pharmacy.

## 2022-12-29 ENCOUNTER — Other Ambulatory Visit: Payer: Self-pay | Admitting: "Endocrinology

## 2022-12-30 ENCOUNTER — Ambulatory Visit: Payer: BLUE CROSS/BLUE SHIELD | Admitting: Urology

## 2023-01-04 ENCOUNTER — Encounter: Payer: Self-pay | Admitting: Internal Medicine

## 2023-01-05 NOTE — Telephone Encounter (Signed)
LMTCB

## 2023-01-05 NOTE — Telephone Encounter (Signed)
noted 

## 2023-01-05 NOTE — Telephone Encounter (Signed)
Patient called back.  I scheduled an appointment for him to see Dr. Duncan Dull tomorrow.

## 2023-01-06 ENCOUNTER — Ambulatory Visit: Payer: BLUE CROSS/BLUE SHIELD | Admitting: Internal Medicine

## 2023-01-25 ENCOUNTER — Ambulatory Visit: Payer: BLUE CROSS/BLUE SHIELD | Admitting: Internal Medicine

## 2023-02-03 ENCOUNTER — Ambulatory Visit: Payer: BLUE CROSS/BLUE SHIELD | Admitting: Nurse Practitioner

## 2023-02-03 NOTE — Progress Notes (Deleted)
 Cardiology Office Note:  .   Date:  12/23/2022 ID:  [name], DOB 22-Jan-1971, MRN [id number] PCP: Marylynn Verneita CROME, MD  [facility] Providers Cardiologist:  Alvan Carrier, MD    History of Present Illness: .   CHAPMAN MATTEUCCI is a 53 y.o. male with a PMH of CAD, s/p NSTEMI in 2022, HTN, HLD, T2DM, restrictive lung disease, and prior DVT, who presents today for 4 week follow-up.   Previous cardiovascular history includes NSTEMI in 2022.  See cardiac cath report noted below. At the time received drug-eluting stent to D1, staged DES to RCA. Residual left circumflex disease was medically managed.  EF at that time 65 to 70%.  Last seen by Dr. Carrier Alvan on November 20, 2022.  Patient had noticed some atypical chest pains, had a recent nuclear stress test that was without ischemia.  Patient noted some DOE.  Echocardiogram was arranged and revealed EF 70 to 75%, no RWMA, no significant valvular abnormalities, no significant change from prior study.  Dr. Alvan recommended that if patient had significant progression of symptoms, may warrant repeat cath.  He had noted a cough on lisinopril , medication was stopped and started on Losartan  12.5 mg daily.   Today he presents for 4-week follow-up.  He states he has had 2 more episodes of chest pain since last office visit. He describes the onset of his chest pain occurring 1 month ago, that occurs along the left side of his chest, denies any radiation, and says taking baby aspirin  helps improve his chest pain. Denies any specific triggers or aggravating factors to his chest pain, says episodes are random in occurrence and episodes are brief (lasting around 1 minute) and stable over time. Rates pain as a 7/10 on 0-10 pain scale. Denies any shortness of breath, palpitations, syncope, presyncope, dizziness, orthopnea, PND, swelling or significant weight changes, acute bleeding, or claudication.  ROS: Negative. See HPI.   Studies Reviewed: SABRA     EKG:      Echo 11/2022:  1. Left ventricular ejection fraction, by estimation, is 70 to 75%. The  left ventricle has hyperdynamic function. The left ventricle has no  regional wall motion abnormalities. There is mild left ventricular  hypertrophy. Left ventricular diastolic  parameters were normal.   2. Right ventricular systolic function is normal. The right ventricular  size is mildly enlarged. Tricuspid regurgitation signal is inadequate for  assessing PA pressure.   3. The mitral valve is normal in structure. Trivial mitral valve  regurgitation. No evidence of mitral stenosis.   4. The aortic valve is tricuspid. Aortic valve regurgitation is not  visualized. No aortic stenosis is present.   5. Aortic dilatation noted. There is borderline dilatation of the aortic  root, measuring 38 mm. Normal size of the ascending aorta.   Comparison(s): No significant change from prior study.  Renal artery duplex bilateral 04/2022:  Normal study. No evidence of renal artery stenosis seen bilaterally.   Lexiscan  07/2021:    The study is normal. The study is low risk.   No ST deviation was noted.   LV perfusion is normal.   Left ventricular function is normal. Nuclear stress EF: 63 %. The left ventricular ejection fraction is normal (55-65%). End diastolic cavity size is normal.  Coronary stent intervention 12/2020:    Prox Cx lesion is 85% stenosed.  This was a small vessel that was diffusely diseased.   Prox RCA to Mid RCA lesion is 80% stenosed.  A drug-eluting stent was successfully placed using a STENT ONYX FRONTIER 4.0X18, posdilated to 4.5 mm.   Post intervention, there is a 0% residual stenosis.   Dist RCA lesion is 50% stenosed.   Diagonal stent was widely patent.   LV end diastolic pressure is normal.   There is no aortic valve stenosis.   Successful PCI of RCA.  He needs aggressive secondary prevention, smoking cessation.  Would consider changing Plavix  to Brilinta  since he  had ACS.   LHC 12/2020:  CONCLUSIONS: High-grade stenosis in the large mid diagonal branch 99% reduced to 0% with TIMI-3 flow using a 2.0 x 12 Onyx frontier.  Chest discomfort was dissimilar to presenting complaint. Left main is normal LAD has mild diffuse disease Circumflex contains 80 to 90% proximal stenosis.  The vessel is diffusely diseased and gives origin to 2-2 small obtuse marginal branches.  If symptoms continue, perhaps this proximal lesion should be stented. RCA has a rosary bead appearance.  It is a large vessel.  There is 60% mid, 60% distal, diffuse disease in the PDA and LV branches. Left ventriculography demonstrated mid anterior wall hypokinesis.  EF 50%.  LVEDP is normal.   RECOMMENDATIONS:   Aggressive risk factor modification. Smoking cessation. Monitor for recurrent symptoms and consider PCI of circumflex if symptoms do not abate with PCI of this moderate-sized diagonal branch  Physical Exam:   VS:  There were no vitals taken for this visit.   Wt Readings from Last 3 Encounters:  12/23/22 205 lb 6.4 oz (93.2 kg)  11/20/22 208 lb (94.3 kg)  11/03/22 203 lb 9.6 oz (92.4 kg)    GEN: Well nourished, well developed in no acute distress NECK: No JVD; No carotid bruits CARDIAC: S1/S2, RRR, no murmurs, rubs, gallops RESPIRATORY:  Clear to auscultation without rales, wheezing or rhonchi  ABDOMEN: Soft, non-tender, non-distended EXTREMITIES:  No edema; No deformity   ASSESSMENT AND PLAN: .    CAD, s/p NSTEMI in 2022 Previous cardiovascular history includes NSTEMI in 2022.  See cardiac cath report noted below. At the time received drug-eluting stent to D1, staged DES to RCA. Residual left circumflex disease was medically managed.  EF at that time 65 to 70%. Admits to some atypical chest pain. Reviewed recent Echo that was benign and did not show WMA. Normal NST in 07/2021. EKG is negative for acute ischemic changes today. Continue current medication regimen. Will  increase Ranexa  to 1,000 mg BID. Will provide refill of Nitroglycerin  and provided education about this medication. Heart healthy diet and regular cardiovascular exercise encouraged. Care and ED precautions discussed.   HTN BP stable. Discussed to monitor BP at home at least 2 hours after medications and sitting for 5-10 minutes. No medication changes at this time. Heart healthy diet and regular cardiovascular exercise encouraged.   HLD LDL 67 09/2022. Continue rosuvastatin . Heart healthy diet and regular cardiovascular exercise encouraged. Continue to follow with PCP.   Dispo: Follow-up with me/APP in 6 weeks or sooner if anything changes.   Signed, Almarie Crate, NP

## 2023-02-08 ENCOUNTER — Other Ambulatory Visit: Payer: Self-pay | Admitting: "Endocrinology

## 2023-02-24 ENCOUNTER — Ambulatory Visit: Payer: BLUE CROSS/BLUE SHIELD | Admitting: Urology

## 2023-03-10 ENCOUNTER — Ambulatory Visit: Payer: BLUE CROSS/BLUE SHIELD | Admitting: "Endocrinology

## 2023-03-11 ENCOUNTER — Encounter: Payer: Self-pay | Admitting: *Deleted

## 2023-03-11 ENCOUNTER — Ambulatory Visit
Admission: EM | Admit: 2023-03-11 | Discharge: 2023-03-11 | Disposition: A | Discharge status: Home / Self Care | Payer: BLUE CROSS/BLUE SHIELD | Attending: Emergency Medicine | Admitting: Emergency Medicine

## 2023-03-11 DIAGNOSIS — J0100 Acute maxillary sinusitis, unspecified: Secondary | ICD-10-CM | POA: Diagnosis not present

## 2023-03-11 MED ORDER — AMOXICILLIN-POT CLAVULANATE 875-125 MG PO TABS: 1.0000 | ORAL_TABLET | Freq: Two times a day (BID) | ORAL | 0 refills | Status: DC

## 2023-03-11 NOTE — Discharge Instructions (Signed)
Take the Augmentin as directed.  Follow up with your primary care provider if your symptoms are not improving.

## 2023-03-11 NOTE — ED Triage Notes (Signed)
Patent states cough for 3 weeks, OTC cold meds haven't helped.  Hasn't had fever.

## 2023-03-11 NOTE — ED Provider Notes (Signed)
Jacob Baker    CSN: [id number] Arrival date & time: 03/11/23  1613      History   Chief Complaint Chief Complaint  Patient presents with   Cough    HPI Jacob Baker is a 53 y.o. male.  Patient presents with 3-week history of sinus pressure, congestion, postnasal drip, runny nose, cough.  No OTC medications taken today but previously attempted several cold medications without relief.  No fever or shortness of breath.  The history is provided by the patient and medical records.    Past Medical History:  Diagnosis Date   (HFpEF) heart failure with preserved ejection fraction (HCC)    a. 12/2020 Echo: EF 65-70%, no rwma, mild LVH, nl RV fxn, triv MR.   CAD (coronary artery disease)    a. 12/2020 NSTEMI/PCI: LM nl, LAD min irregs, D1 99 (2.0x12 Onyx Frontier DES), LCX 85p (small), RCA 80p/m (4.0x18 Onyx Frontier DES - staged following day), 60d, RPDA mod dzs. EF 50-55%.   DVT (deep venous thrombosis) (HCC) 04/2017   GERD (gastroesophageal reflux disease)    History of migraine    Hyperlipidemia    Hypertension    Long term current use of antithrombotics/antiplatelets    a.) on daily DAPT therapy (ticagrelor + ASA)   Non-STEMI (non-ST elevated myocardial infarction) (HCC) 12/30/2020   Syncope and collapse    a.) single episode; related to dehydration   T2DM (type 2 diabetes mellitus) (HCC)     Patient Active Problem List   Diagnosis Date Noted   Benign prostatic hyperplasia with nocturia 12/03/2022   Long term current use of oral hypoglycemic drug 06/25/2022   Back pain 04/16/2022   Restrictive lung disease 10/29/2021   Proteinuria due to type 2 diabetes mellitus (HCC) 10/26/2021   DM type 2 causing vascular disease (HCC) 09/04/2021   DOE (dyspnea on exertion) 08/19/2021   Coronary artery disease involving native coronary artery of native heart with angina pectoris (HCC) 05/22/2021   History of acute cholecystitis 01/08/2021   CAD/Status post coronary artery  stent placement 12/2020    Leukocytosis 12/31/2020   GERD (gastroesophageal reflux disease) 12/31/2020   History of non-ST elevation myocardial infarction (NSTEMI) 12/31/2020   Chronic right shoulder pain 10/21/2020   Mixed hyperlipidemia 10/26/2018   Chronic idiopathic constipation    Change in bowel habits    Rectal polyp    Chronic pain of right hip 10/15/2015   Neuropathy 07/24/2015   Essential hypertension, benign 07/24/2015   Dyslipidemia with low high density lipoprotein (HDL) cholesterol with hypertriglyceridemia due to type 2 diabetes mellitus (HCC) 07/24/2015   Early satiety    Pituitary insufficiency (HCC) 09/12/2014   Low testosterone 09/11/2014   Hyperlipidemia associated with type 2 diabetes mellitus (HCC) 11/26/2013   Former smoker 11/24/2013   Hiatal hernia 11/15/2013   Tubular adenoma of colon 11/15/2013   History of Helicobacter pylori infection 10/18/2013   Gastritis due to nonsteroidal anti-inflammatory drug (NSAID) 10/14/2013   Anxiety 04/26/2013   Chest pain 10/12/2011    Past Surgical History:  Procedure Laterality Date   CHOLECYSTECTOMY     COLONOSCOPY     COLONOSCOPY WITH PROPOFOL N/A 08/06/2017   Procedure: COLONOSCOPY WITH PROPOFOL;  Surgeon: Midge Minium, MD;  Location: [facility];  Service: Endoscopy;  Laterality: N/A;  Diabetic - oral meds   CORONARY STENT INTERVENTION N/A 12/31/2020   Procedure: CORONARY STENT INTERVENTION;  Surgeon: Lyn Records, MD;  Location: [facility];  Service: Cardiovascular;  Laterality:  N/A;   CORONARY STENT INTERVENTION N/A 01/01/2021   Procedure: CORONARY STENT INTERVENTION;  Surgeon: Corky Crafts, MD;  Location: [facility];  Service: Cardiovascular;  Laterality: N/A;   ESOPHAGEAL DILATION  09/24/2016   Procedure: ESOPHAGEAL DILATION;  Surgeon: Midge Minium, MD;  Location: [facility];  Service: Gastroenterology;;   ESOPHAGOGASTRODUODENOSCOPY (EGD) WITH PROPOFOL N/A 04/08/2015    Procedure: ESOPHAGOGASTRODUODENOSCOPY (EGD) WITH PROPOFOL with dialation;  Surgeon: Midge Minium, MD;  Location: [facility];  Service: Endoscopy;  Laterality: N/A;  diabetic - oral meds   ESOPHAGOGASTRODUODENOSCOPY (EGD) WITH PROPOFOL N/A 09/24/2016   Procedure: ESOPHAGOGASTRODUODENOSCOPY (EGD) WITH PROPOFOL WITH DILATION;  Surgeon: Midge Minium, MD;  Location: [facility];  Service: Gastroenterology;  Laterality: N/A;  Diabetic - oral meds   FINGER AMPUTATION  01/27/2007   partial removal left index finger    LEFT HEART CATH AND CORONARY ANGIOGRAPHY N/A 12/31/2020   Procedure: LEFT HEART CATH AND CORONARY ANGIOGRAPHY;  Surgeon: Lyn Records, MD;  Location: [facility];  Service: Cardiovascular;  Laterality: N/A;   LEFT HEART CATH AND CORONARY ANGIOGRAPHY N/A 01/01/2021   Procedure: LEFT HEART CATH AND CORONARY ANGIOGRAPHY;  Surgeon: Corky Crafts, MD;  Location[facility] IN[facility] CV LAB;  Service: Cardiovascular;  Laterality: N/A;   POLYPECTOMY  08/06/2017   Procedure: POLYPECTOMY INTESTINAL;  Surgeon: Midge Minium, MD;  Location: [facility];  Service: Endoscopy;;       Home Medications    Prior to Admission medications   Medication Sig Start Date End Date Taking? Authorizing Provider  albuterol (VENTOLIN HFA) 108 (90 Base) MCG/ACT inhaler Inhale 2 puffs into the lungs every 6 (six) hours as needed for wheezing or shortness of breath. 08/18/21  Yes [name], MD  amoxicillin-clavulanate (AUGMENTIN) 875-125 MG tablet Take 1 tablet by mouth every 12 (twelve) hours. 03/11/23  Yes Mickie Bail, NP  amLODipine (NORVASC) 10 MG tablet TAKE 1 TABLET(10 MG) BY MOUTH DAILY 12/02/22   [name], MD  aspirin 81 MG EC tablet Take 1 tablet (81 mg total) by mouth daily with breakfast. Swallow whole. 01/13/21   Shon Hale, MD  carvedilol (COREG) 3.125 MG tablet Take 1 tablet (3.125 mg total) by mouth 2 (two) times daily with a meal. 10/23/22   [name], MD  Continuous Glucose Sensor (FREESTYLE LIBRE 3 SENSOR) MISC USE TO CHECK BLOOD SUGAR, CHANGE SENSOR EVERY 14 DAYS 02/08/23   Roma Kayser, MD  glipiZIDE (GLUCOTROL XL) 5 MG 24 hr tablet TAKE 1 TABLET(5 MG) BY MOUTH DAILY WITH BREAKFAST 12/29/22   Roma Kayser, MD  glucose blood (ONETOUCH VERIO) test strip Test sugars twice daily 04/26/14   Phadke, Radhika P, MD  losartan (COZAAR) 25 MG tablet Take 0.5 tablets (12.5 mg total) by mouth daily. 11/20/22 02/18/23  Antoine Poche, MD  metFORMIN (GLUCOPHAGE) 850 MG tablet TAKE 1 TABLET(850 MG) BY MOUTH TWICE DAILY WITH A MEAL 08/18/22   Nida, Denman George, MD  metoprolol tartrate (LOPRESSOR) 25 MG tablet Take 25 mg by mouth 2 (two) times daily. 12/11/22   [provider]  nitroGLYCERIN (NITROSTAT) 0.4 MG SL tablet Place 1 tablet (0.4 mg total) under the tongue every 5 (five) minutes as needed for chest pain. 12/23/22 12/23/23  Sharlene Dory, NP  [facility] DELICA LANCETS 33G MISC Test sugars twice daily 04/26/14   Phadke, Janina Mayo, MD  pantoprazole (PROTONIX) 40 MG tablet TAKE 1 TABLET BY MOUTH DAILY 12/21/22  [name], MD  ranolazine (RANEXA) 1000 MG SR tablet Take 1 tablet (1,000 mg total) by mouth 2 (two) times daily. 12/23/22   Sharlene Dory, NP  rosuvastatin (CRESTOR) 40 MG tablet Take 1 tablet (40 mg total) by mouth daily. 10/23/22   [name], MD  vitamin B-12 (CYANOCOBALAMIN) 1000 MCG tablet Take 1,000 mcg by mouth daily.    [provider]    Family History Family History  Problem Relation Age of Onset   Hypertension Mother    Heart disease Father        CAD   Hypertension Father    Diabetes Father    Hypertension Sister    Hypertension Brother    Hypertension Brother     Social History Social History   Tobacco Use   Smoking status: Former    Current packs/day: 0.00    Average packs/day: 2.0 packs/day for 27.0 years (54.0 ttl pk-yrs)    Types: Cigarettes    Start  date: 12/30/1993    Quit date: 12/30/2020    Years since quitting: 2.1    Passive exposure: Never   Smokeless tobacco: Never  Vaping Use   Vaping status: Never Used  Substance Use Topics   Alcohol use: No   Drug use: No     Allergies   Patient has no known allergies.   Review of Systems Review of Systems  Constitutional:  Negative for chills and fever.  HENT:  Positive for congestion, postnasal drip, rhinorrhea and sinus pressure. Negative for ear pain and sore throat.   Respiratory:  Positive for cough. Negative for shortness of breath.      Physical Exam Triage Vital Signs ED Triage Vitals  Encounter Vitals Group     BP      Systolic BP Percentile      Diastolic BP Percentile      Pulse      Resp      Temp      Temp src      SpO2      Weight      Height      Head Circumference      Peak Flow      Pain Score      Pain Loc      Pain Education      Exclude from Growth Chart    No data found.  Updated Vital Signs BP 119/71 (BP Location: Left Arm)   Pulse 80   Temp 98.7 F (37.1 C) (Oral)   Resp 18   Ht 6\' 1"  (1.854 m)   Wt 212 lb (96.2 kg)   SpO2 98%   BMI 27.97 kg/m   Visual Acuity Right Eye Distance:   Left Eye Distance:   Bilateral Distance:    Right Eye Near:   Left Eye Near:    Bilateral Near:     Physical Exam Constitutional:      General: He is not in acute distress. HENT:     Right Ear: Tympanic membrane normal.     Left Ear: Tympanic membrane normal.     Nose: Congestion present.     Mouth/Throat:     Mouth: Mucous membranes are moist.     Pharynx: Oropharynx is clear.  Cardiovascular:     Rate and Rhythm: Normal rate and regular rhythm.     Heart sounds: Normal heart sounds.  Pulmonary:     Effort: Pulmonary effort is normal. No respiratory distress.     Breath  sounds: Normal breath sounds.  Neurological:     Mental Status: He is alert.      UC Treatments / Results  Labs (all labs ordered are listed, but only abnormal  results are displayed) Labs Reviewed - No data to display  EKG   Radiology No results found.  Procedures Procedures (including critical care time)  Medications Ordered in UC Medications - No data to display  Initial Impression / Assessment and Plan / UC Course  I have reviewed the triage vital signs and the nursing notes.  Pertinent labs & imaging results that were available during my care of the patient were reviewed by me and considered in my medical decision making (see chart for details).    Acute sinusitis.  Patient has been symptomatic for 3 weeks.  Treating today with Augmentin.  Tylenol or ibuprofen as needed.  Plain Mucinex as needed.  Education provided on sinus infection.  Instructed patient to follow-up with his PCP if his symptoms are not improving.  He agrees to plan of care.  Final Clinical Impressions(s) / UC Diagnoses   Final diagnoses:  Acute non-recurrent maxillary sinusitis     Discharge Instructions      Take the Augmentin as directed.  Follow-up with your primary care provider if your symptoms are not improving.      ED Prescriptions     Medication Sig Dispense Auth. Provider   amoxicillin-clavulanate (AUGMENTIN) 875-125 MG tablet Take 1 tablet by mouth every 12 (twelve) hours. 14 tablet Mickie Bail, NP      PDMP not reviewed this encounter.   Mickie Bail, NP 03/11/23 [phone]

## 2023-03-18 ENCOUNTER — Ambulatory Visit: Payer: BLUE CROSS/BLUE SHIELD | Attending: Nurse Practitioner | Admitting: Nurse Practitioner

## 2023-03-18 ENCOUNTER — Encounter: Payer: Self-pay | Admitting: Nurse Practitioner

## 2023-03-18 VITALS — BP 116/70 | HR 72 | Ht 73.0 in | Wt 202.0 lb

## 2023-03-18 DIAGNOSIS — I25119 Atherosclerotic heart disease of native coronary artery with unspecified angina pectoris: Secondary | ICD-10-CM | POA: Diagnosis not present

## 2023-03-18 DIAGNOSIS — I10 Essential (primary) hypertension: Secondary | ICD-10-CM

## 2023-03-18 DIAGNOSIS — Z024 Encounter for examination for driving license: Secondary | ICD-10-CM

## 2023-03-18 DIAGNOSIS — E785 Hyperlipidemia, unspecified: Secondary | ICD-10-CM

## 2023-03-18 MED ORDER — METOPROLOL TARTRATE 25 MG PO TABS: 25.0000 mg | ORAL_TABLET | Freq: Two times a day (BID) | ORAL | 1 refills | Status: DC

## 2023-03-18 NOTE — Progress Notes (Unsigned)
Cardiology Office Note:  .   Date: 03/18/2023  ID:  Jacob Baker, DOB [birth year], MRN [id number] PCP: Jacob Shams, MD  [facility] Providers Cardiologist:  Dina Rich, MD    History of Present Illness: .   Jacob Baker is a 53 y.o. male with a PMH of CAD, s/p NSTEMI in 2022, HTN, HLD, T2DM, restrictive lung disease, and prior DVT, who presents today for 6 week follow-up.   Previous cardiovascular history includes NSTEMI in 2022.  See cardiac cath report noted below. At the time received drug-eluting stent to D1, staged DES to RCA. Residual left circumflex disease was medically managed.  EF at that time 65 to 70%.  Last seen by Dr. Dina Rich on November 20, 2022.  Patient had noticed some atypical chest pains, had a recent nuclear stress test that was without ischemia.  Patient noted some DOE.  Echocardiogram was arranged and revealed EF 70 to 75%, no RWMA, no significant valvular abnormalities, no significant change from prior study.  Dr. Wyline Mood recommended that if patient had significant progression of symptoms, may warrant repeat cath.  He had noted a cough on lisinopril, medication was stopped and started on Losartan 12.5 mg daily.   12/23/2022-today he presents for 4-week follow-up.  He states he has had 2 more episodes of chest pain since last office visit. He describes the onset of his chest pain occurring 1 month ago, that occurs along the left side of his chest, denies any radiation, and says taking baby aspirin helps improve his chest pain. Denies any specific triggers or aggravating factors to his chest pain, says episodes are random in occurrence and episodes are brief (lasting around 1 minute) and stable over time. Rates pain as a 7/10 on 0-10 pain scale. Denies any shortness of breath, palpitations, syncope, presyncope, dizziness, orthopnea, PND, swelling or significant weight changes, acute bleeding, or claudication.  03/18/2023 - Presents today for follow-up.   Says he could not tolerate increased dose of Ranexa as this made him jittery.  He has returned to taking Ranexa 500 mg twice daily and tolerating this well and denies any issues.  Also confirms to me he is not taking carvedilol and is now returned to taking Lopressor 25 mg twice daily and is doing much better, and chest pain has improved. Denies any shortness of breath, palpitations, syncope, presyncope, dizziness, orthopnea, PND, swelling or significant weight changes, acute bleeding, or claudication.  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  Tells me he is not able to physically do the treadmill stress test.  ROS: Negative. See HPI.  SH: Works as a [employer].  Studies Reviewed: Marland Kitchen    EKG: EKG is not ordered today.      Echo 11/2022:  1. Left ventricular ejection fraction, by estimation, is 70 to 75%. The  left ventricle has hyperdynamic function. The left ventricle has no  regional wall motion abnormalities. There is mild left ventricular  hypertrophy. Left ventricular diastolic  parameters were normal.   2. Right ventricular systolic function is normal. The right ventricular  size is mildly enlarged. Tricuspid regurgitation signal is inadequate for  assessing PA pressure.   3. The mitral valve is normal in structure. Trivial mitral valve  regurgitation. No evidence of mitral stenosis.   4. The aortic valve is tricuspid. Aortic valve regurgitation is not  visualized. No aortic stenosis is present.   5.  Aortic dilatation noted. There is borderline dilatation of the aortic  root, measuring 38 mm. Normal size of the ascending aorta.   Comparison(s): No significant change from prior study.  Renal artery duplex bilateral 04/2022:  Normal study. No evidence of renal artery stenosis seen bilaterally.   Lexiscan 07/2021:    The study is normal. The study is low risk.   No ST deviation was noted.   LV perfusion is normal.   Left  ventricular function is normal. Nuclear stress EF: 63 %. The left ventricular ejection fraction is normal (55-65%). End diastolic cavity size is normal.  Coronary stent intervention 12/2020:    Prox Cx lesion is 85% stenosed.  This was a small vessel that was diffusely diseased.   Prox RCA to Mid RCA lesion is 80% stenosed.   A drug-eluting stent was successfully placed using a STENT ONYX FRONTIER 4.0X18, posdilated to 4.5 mm.   Post intervention, there is a 0% residual stenosis.   Dist RCA lesion is 50% stenosed.   Diagonal stent was widely patent.   LV end diastolic pressure is normal.   There is no aortic valve stenosis.   Successful PCI of RCA.  He needs aggressive secondary prevention, smoking cessation.  Would consider changing Plavix to Brilinta since he had ACS.   LHC 12/2020:  CONCLUSIONS: High-grade stenosis in the large mid diagonal branch 99% reduced to 0% with TIMI-3 flow using a 2.0 x 12 Onyx frontier.  Chest discomfort was dissimilar to presenting complaint. Left main is normal LAD has mild diffuse disease Circumflex contains 80 to 90% proximal stenosis.  The vessel is diffusely diseased and gives origin to 2-2 small obtuse marginal branches.  If symptoms continue, perhaps this proximal lesion should be stented. RCA has a "rosary bead "appearance.  It is a large vessel.  There is 60% mid, 60% distal, diffuse disease in the PDA and LV branches. Left ventriculography demonstrated mid anterior wall hypokinesis.  EF 50%.  LVEDP is normal.   RECOMMENDATIONS:   Aggressive risk factor modification. Smoking cessation. Monitor for recurrent symptoms and consider PCI of circumflex if symptoms do not abate with PCI of this moderate-sized diagonal branch  Physical Exam:   VS:  BP 116/70   Pulse 72   Ht 6\' 1"  (1.854 m)   Wt 202 lb (91.6 kg)   SpO2 98%   BMI 26.65 kg/m    Wt Readings from Last 3 Encounters:  03/18/23 202 lb (91.6 kg)  03/11/23 212 lb (96.2 kg)  12/23/22 205  lb 6.4 oz (93.2 kg)    GEN: Well nourished, well developed in no acute distress NECK: No JVD; No carotid bruits CARDIAC: S1/S2, RRR, no murmurs, rubs, gallops RESPIRATORY:  Clear to auscultation without rales, wheezing or rhonchi  ABDOMEN: Soft, non-tender, non-distended EXTREMITIES:  No edema; No deformity   ASSESSMENT AND PLAN: .    CAD, s/p NSTEMI in 2022, DOT renewal Previous cardiovascular history includes NSTEMI in 2022.  See cardiac cath report noted below. At the time received drug-eluting stent to D1, staged DES to RCA. Residual left circumflex disease was medically managed.  EF at that time 65 to 70%.  Chest pain has improved since last office visit after he self adjusted his medications-see HPI.  He is due for DOT clearance and will arrange Lexiscan for further evaluation.  Went over risk versus benefits of this and he verbalized understanding is agreeable to proceed.  Continue current medication regimen. Heart healthy diet and regular cardiovascular exercise  encouraged. Care and ED precautions discussed. Will provide refill of Lopressor.   Informed Consent   Shared Decision Making/Informed Consent The risks [chest pain, shortness of breath, cardiac arrhythmias, dizziness, blood pressure fluctuations, myocardial infarction, stroke/transient ischemic attack, nausea, vomiting, allergic reaction, radiation exposure, metallic taste sensation and life-threatening complications (estimated to be 1 in 10,000)], benefits (risk stratification, diagnosing coronary artery disease, treatment guidance) and alternatives of a nuclear stress test were discussed in detail with Mr. Doublin and he agrees to proceed.     HTN BP stable. Discussed to monitor BP at home at least 2 hours after medications and sitting for 5-10 minutes. No medication changes at this time. Heart healthy diet and regular cardiovascular exercise encouraged.   HLD LDL 67 09/2022. Continue rosuvastatin. Heart healthy diet and  regular cardiovascular exercise encouraged. Continue to follow with PCP.   Dispo: Follow-up with me/APP in 3 months or sooner if anything changes.   Signed, Sharlene Dory, NP

## 2023-03-18 NOTE — Patient Instructions (Addendum)
Medication Instructions:  Your physician recommends that you continue on your current medications as directed. Please refer to the Current Medication list given to you today.  Labwork: None  Testing/Procedures: Your physician has requested that you have a lexiscan myoview. For further information please visit [email or web address]. Please follow instruction sheet, as given.  Follow-Up: Your physician recommends that you schedule a follow-up appointment in: 3 months.  Any Other Special Instructions Will Be Listed Below (If Applicable).     If you need a refill on your cardiac medications before your next appointment, please call your pharmacy.   

## 2023-03-30 ENCOUNTER — Encounter (HOSPITAL_BASED_OUTPATIENT_CLINIC_OR_DEPARTMENT_OTHER)
Admission: RE | Admit: 2023-03-30 | Discharge: 2023-03-30 | Disposition: A | Discharge status: Home / Self Care | Payer: BLUE CROSS/BLUE SHIELD | Source: Ambulatory Visit | Attending: Nurse Practitioner | Admitting: Nurse Practitioner

## 2023-03-30 ENCOUNTER — Ambulatory Visit (HOSPITAL_COMMUNITY)
Admission: RE | Admit: 2023-03-30 | Discharge: 2023-03-30 | Disposition: A | Discharge status: Home / Self Care | Payer: BLUE CROSS/BLUE SHIELD | Source: Ambulatory Visit | Attending: Cardiology | Admitting: Cardiology

## 2023-03-30 DIAGNOSIS — I25119 Atherosclerotic heart disease of native coronary artery with unspecified angina pectoris: Secondary | ICD-10-CM | POA: Diagnosis present

## 2023-03-30 LAB — NM MYOCAR MULTI W/SPECT W/WALL MOTION / EF
Estimated workload: 1
Exercise duration (min): 0 min
Exercise duration (sec): 0 s
LV dias vol: 104 mL (ref 62–150)
LV sys vol: 39 mL
MPHR: 166 {beats}/min
Nuc Stress EF: 63 %
Peak HR: 82 {beats}/min
Percent HR: 49 %
RATE: 0.3
Rest HR: 67 {beats}/min
Rest Nuclear Isotope Dose: 10 mCi
SDS: 0
SRS: 1
SSS: 1
ST Depression (mm): 0 mm
Stress Nuclear Isotope Dose: 30.8 mCi
TID: 1.29

## 2023-03-30 MED ORDER — TECHNETIUM TC 99M TETROFOSMIN IV KIT
30.8000 | PACK | Freq: Once | INTRAVENOUS | Status: AC | PRN
  Administered 2023-03-30: 30.8 via INTRAVENOUS

## 2023-03-30 MED ORDER — REGADENOSON 0.4 MG/5ML IV SOLN
INTRAVENOUS | Status: AC
  Administered 2023-03-30: 0.4 mg via INTRAVENOUS
  Filled 2023-03-30: qty 5

## 2023-03-30 MED ORDER — TECHNETIUM TC 99M TETROFOSMIN IV KIT
10.0000 | PACK | Freq: Once | INTRAVENOUS | Status: AC | PRN
  Administered 2023-03-30: 10 via INTRAVENOUS

## 2023-03-30 MED ORDER — SODIUM CHLORIDE FLUSH 0.9 % IV SOLN
INTRAVENOUS | Status: AC
  Filled 2023-03-30: qty 10

## 2023-04-25 ENCOUNTER — Other Ambulatory Visit: Payer: Self-pay | Admitting: Internal Medicine

## 2023-04-26 NOTE — Telephone Encounter (Signed)
 Pt gets filled by his endocrinologist. Please refuse refill request.

## 2023-04-28 ENCOUNTER — Other Ambulatory Visit: Payer: Self-pay | Admitting: "Endocrinology

## 2023-04-28 ENCOUNTER — Ambulatory Visit: Payer: BLUE CROSS/BLUE SHIELD | Admitting: "Endocrinology

## 2023-04-28 ENCOUNTER — Encounter: Payer: Self-pay | Admitting: "Endocrinology

## 2023-04-28 VITALS — BP 122/70 | HR 64 | Ht 73.0 in | Wt 200.6 lb

## 2023-04-28 DIAGNOSIS — I10 Essential (primary) hypertension: Secondary | ICD-10-CM | POA: Diagnosis not present

## 2023-04-28 DIAGNOSIS — E1159 Type 2 diabetes mellitus with other circulatory complications: Secondary | ICD-10-CM | POA: Diagnosis not present

## 2023-04-28 DIAGNOSIS — Z7984 Long term (current) use of oral hypoglycemic drugs: Secondary | ICD-10-CM | POA: Diagnosis not present

## 2023-04-28 DIAGNOSIS — E782 Mixed hyperlipidemia: Secondary | ICD-10-CM | POA: Diagnosis not present

## 2023-04-28 LAB — POCT GLYCOSYLATED HEMOGLOBIN (HGB A1C): HbA1c, POC (controlled diabetic range): 6.3 % (ref 0.0–7.0)

## 2023-04-28 MED ORDER — FREESTYLE LIBRE 3 SENSOR MISC: 4 refills | Status: DC

## 2023-04-28 NOTE — Patient Instructions (Signed)

## 2023-04-28 NOTE — Progress Notes (Signed)
 04/28/2023, 2:46 PM   Endocrinology follow-up note  Subjective:    Patient ID: Jacob Baker, male    DOB: 1970-02-26.  Jacob Baker is being seen in follow-up after he was seen in consultation for management of currently uncontrolled symptomatic diabetes requested by  [name], MD.   Past Medical History:  Diagnosis Date   (HFpEF) heart failure with preserved ejection fraction (HCC)    a. 12/2020 Echo: EF 65-70%, no rwma, mild LVH, nl RV fxn, triv MR.   CAD (coronary artery disease)    a. 12/2020 NSTEMI/PCI: LM nl, LAD min irregs, D1 99 (2.0x12 Onyx Frontier DES), LCX 85p (small), RCA 80p/m (4.0x18 Onyx Frontier DES - staged following day), 60d, RPDA mod dzs. EF 50-55%.   DVT (deep venous thrombosis) (HCC) 04/2017   GERD (gastroesophageal reflux disease)    History of migraine    Hyperlipidemia    Hypertension    Long term current use of antithrombotics/antiplatelets    a.) on daily DAPT therapy (ticagrelor + ASA)   Non-STEMI (non-ST elevated myocardial infarction) (HCC) 12/30/2020   Syncope and collapse    a.) single episode; related to dehydration   T2DM (type 2 diabetes mellitus) (HCC)     Past Surgical History:  Procedure Laterality Date   CHOLECYSTECTOMY     COLONOSCOPY     COLONOSCOPY WITH PROPOFOL N/A 08/06/2017   Procedure: COLONOSCOPY WITH PROPOFOL;  Surgeon: Midge Minium, MD;  Location: [facility];  Service: Endoscopy;  Laterality: N/A;  Diabetic - oral meds   CORONARY STENT INTERVENTION N/A 12/31/2020   Procedure: CORONARY STENT INTERVENTION;  Surgeon: Lyn Records, MD;  Location: [facility];  Service: Cardiovascular;  Laterality: N/A;   CORONARY STENT INTERVENTION N/A 01/01/2021   Procedure: CORONARY STENT INTERVENTION;  Surgeon: Corky Crafts, MD;  Location: [facility] IN[facility] CV [facility];  Service: Cardiovascular;  Laterality: N/A;   ESOPHAGEAL DILATION   09/24/2016   Procedure: ESOPHAGEAL DILATION;  Surgeon: Midge Minium, MD;  Location: [facility];  Service: Gastroenterology;;   ESOPHAGOGASTRODUODENOSCOPY (EGD) WITH PROPOFOL N/A 04/08/2015   Procedure: ESOPHAGOGASTRODUODENOSCOPY (EGD) WITH PROPOFOL with dialation;  Surgeon: Midge Minium, MD;  Location: [facility];  Service: Endoscopy;  Laterality: N/A;  diabetic - oral meds   ESOPHAGOGASTRODUODENOSCOPY (EGD) WITH PROPOFOL N/A 09/24/2016   Procedure: ESOPHAGOGASTRODUODENOSCOPY (EGD) WITH PROPOFOL WITH DILATION;  Surgeon: Midge Minium, MD;  Location: [facility];  Service: Gastroenterology;  Laterality: N/A;  Diabetic - oral meds   FINGER AMPUTATION  01/27/2007   partial removal left index finger    LEFT HEART CATH AND CORONARY ANGIOGRAPHY N/A 12/31/2020   Procedure: LEFT HEART CATH AND CORONARY ANGIOGRAPHY;  Surgeon: Lyn Records, MD;  Location: [facility];  Service: Cardiovascular;  Laterality: N/A;   LEFT HEART CATH AND CORONARY ANGIOGRAPHY N/A 01/01/2021   Procedure: LEFT HEART CATH AND CORONARY ANGIOGRAPHY;  Surgeon: Corky Crafts, MD;  Location: [facility] INV[facility] CV LAB;  Service: Cardiovascular;  Laterality: N/A;   POLYPECTOMY  08/06/2017   Procedure: POLYPECTOMY INTESTINAL;  Surgeon: Midge Minium, MD;  Location: [facility];  Service: Endoscopy;;  Social History   Socioeconomic History   Marital status: Married    Spouse name: Carollee Herter   Number of children: 4   Years of education: Not on file   Highest education level: Not on file  Occupational History   Not on file  Tobacco Use   Smoking status: Former    Current packs/day: 0.00    Average packs/day: 2.0 packs/day for 27.0 years (54.0 ttl pk-yrs)    Types: Cigarettes    Start date: 12/30/1993    Quit date: 12/30/2020    Years since quitting: 2.3    Passive exposure: Never   Smokeless tobacco: Never  Vaping Use   Vaping status: Never Used  Substance and Sexual Activity   Alcohol  use: No   Drug use: No   Sexual activity: Not on file  Other Topics Concern   Not on file  Social History Narrative   Not on file   Social Drivers of Health   Financial Resource Strain: Not on file  Food Insecurity: Not on file  Transportation Needs: Not on file  Physical Activity: Not on file  Stress: Not on file  Social Connections: Not on file    Family History  Problem Relation Age of Onset   Hypertension Mother    Heart disease Father        CAD   Hypertension Father    Diabetes Father    Hypertension Sister    Hypertension Brother    Hypertension Brother     Outpatient Encounter Medications as of 04/28/2023  Medication Sig   albuterol (VENTOLIN HFA) 108 (90 Base) MCG/ACT inhaler Inhale 2 puffs into the lungs every 6 (six) hours as needed for wheezing or shortness of breath.   amLODipine (NORVASC) 10 MG tablet TAKE 1 TABLET(10 MG) BY MOUTH DAILY   amoxicillin-clavulanate (AUGMENTIN) 875-125 MG tablet Take 1 tablet by mouth every 12 (twelve) hours.   aspirin 81 MG EC tablet Take 1 tablet (81 mg total) by mouth daily with breakfast. Swallow whole.   carvedilol (COREG) 3.125 MG tablet Take 1 tablet (3.125 mg total) by mouth 2 (two) times daily with a meal. (Patient not taking: Reported on 03/18/2023)   Continuous Glucose Sensor (FREESTYLE LIBRE 3 SENSOR) MISC USE TO CHECK BLOOD SUGAR, CHANGE SENSOR EVERY 14 DAYS   glipiZIDE (GLUCOTROL XL) 5 MG 24 hr tablet TAKE 1 TABLET BY MOUTH EVERY DAY WITH BREAKFAST   glucose blood (ONETOUCH VERIO) test strip Test sugars twice daily   losartan (COZAAR) 25 MG tablet Take 0.5 tablets (12.5 mg total) by mouth daily.   metFORMIN (GLUCOPHAGE) 850 MG tablet TAKE 1 TABLET(850 MG) BY MOUTH TWICE DAILY WITH A MEAL   metoprolol tartrate (LOPRESSOR) 25 MG tablet Take 1 tablet (25 mg total) by mouth 2 (two) times daily.   nitroGLYCERIN (NITROSTAT) 0.4 MG SL tablet Place 1 tablet (0.4 mg total) under the tongue every 5 (five) minutes as needed for  chest pain.   ONETOUCH DELICA LANCETS 33G MISC Test sugars twice daily   pantoprazole (PROTONIX) 40 MG tablet TAKE 1 TABLET BY MOUTH DAILY   ranolazine (RANEXA) 1000 MG SR tablet Take 1 tablet (1,000 mg total) by mouth 2 (two) times daily. (Patient taking differently: Take 500 mg by mouth 2 (two) times daily.)   rosuvastatin (CRESTOR) 40 MG tablet Take 1 tablet (40 mg total) by mouth daily.   vitamin B-12 (CYANOCOBALAMIN) 1000 MCG tablet Take 1,000 mcg by mouth daily.   [DISCONTINUED] Continuous Glucose Sensor (FREESTYLE LIBRE  3 SENSOR) MISC USE TO CHECK BLOOD SUGAR, CHANGE SENSOR EVERY 14 DAYS   No facility-administered encounter medications on file as of 04/28/2023.    ALLERGIES: No Known Allergies  VACCINATION STATUS: Immunization History  Administered Date(s) Administered   Influenza Split 12/15/2013   Influenza, Seasonal, Injecte, Preservative Fre 10/23/2022   Influenza,inj,Quad PF,6+ Mos 10/26/2014, 10/14/2015, 10/13/2017, 10/18/2018, 10/20/2019, 10/21/2020, 10/21/2021   Influenza-Unspecified 09/26/2016   Moderna Sars-Covid-2 Vaccination 04/28/2019, 05/30/2019   PNEUMOCOCCAL CONJUGATE-20 10/21/2021   Pneumococcal Polysaccharide-23 10/14/2015   Tdap 10/26/2014    Diabetes He presents for his follow-up diabetic visit. He has type 2 diabetes mellitus. His disease course has been stable. There are no hypoglycemic associated symptoms. Pertinent negatives for hypoglycemia include no confusion, headaches, pallor or seizures. Pertinent negatives for diabetes include no chest pain, no fatigue, no polydipsia, no polyphagia, no polyuria and no weakness. Symptoms are stable. Diabetic complications include heart disease. (In the interim , he developed chest pain, hospitalized and diagnosed with ST Elevation MI. He is s/p stent placement.) Risk factors for coronary artery disease include dyslipidemia, diabetes mellitus, male sex, family history and tobacco exposure. Current diabetic treatment  includes oral agent (triple therapy). His weight is fluctuating minimally. He is following a generally unhealthy diet. When asked about meal planning, he reported none. He has not had a previous visit with a dietitian. He rarely participates in exercise. His home blood glucose trend is fluctuating minimally. His breakfast blood glucose range is generally 140-180 mg/dl. His lunch blood glucose range is generally 140-180 mg/dl. His dinner blood glucose range is generally 140-180 mg/dl. His bedtime blood glucose range is generally 140-180 mg/dl. His overall blood glucose range is 140-180 mg/dl. (He uses freestyle libre device.  AGP report shows 78% time in range, 19% level 1 hyperglycemia, 2% level 2 hyperglycemia.  He does not have hypoglycemia.  His point-of-care A1c is 6.3%, improving.  His insurance did not provide coverage for GLP-1 receptor agonists.  )  Hyperlipidemia This is a chronic problem. The current episode started more than 1 year ago. The problem is uncontrolled. Exacerbating diseases include diabetes. Pertinent negatives include no chest pain, myalgias or shortness of breath. Current antihyperlipidemic treatment includes statins. Risk factors for coronary artery disease include diabetes mellitus, dyslipidemia, male sex and family history.  Hypertension This is a chronic problem. The current episode started more than 1 year ago. The problem is controlled. Pertinent negatives include no chest pain, headaches, neck pain, palpitations or shortness of breath. Risk factors for coronary artery disease include family history, dyslipidemia, diabetes mellitus, male gender and smoking/tobacco exposure. Past treatments include calcium channel blockers. Hypertensive end-organ damage includes CAD/MI.    Objective:    BP 122/70   Pulse 64   Ht 6\' 1"  (1.854 m)   Wt 200 lb 9.6 oz (91 kg)   BMI 26.47 kg/m   Wt Readings from Last 3 Encounters:  04/28/23 200 lb 9.6 oz (91 kg)  03/18/23 202 lb (91.6 kg)   03/11/23 212 lb (96.2 kg)     CMP ( most recent) CMP     Component Value Date/Time   NA 144 10/08/2022 1044   NA 138 10/04/2013 2025   K 4.3 10/08/2022 1044   K 3.0 (L) 10/04/2013 2025   CL 106 10/08/2022 1044   CL 104 10/04/2013 2025   CO2 22 10/08/2022 1044   CO2 25 10/04/2013 2025   GLUCOSE 171 (H) 10/08/2022 1044   GLUCOSE 117 (H) 08/18/2021 1613   GLUCOSE 175 (  H) 10/04/2013 2025   BUN 11 10/08/2022 1044   BUN 13 10/04/2013 2025   CREATININE 1.16 10/08/2022 1044   CREATININE 1.04 11/16/2019 1505   CALCIUM 9.7 10/08/2022 1044   CALCIUM 9.2 10/04/2013 2025   PROT 6.8 10/08/2022 1044   PROT 7.4 10/04/2013 2025   ALBUMIN 4.8 10/08/2022 1044   ALBUMIN 4.1 10/04/2013 2025   AST 16 10/08/2022 1044   AST 8 (L) 10/04/2013 2025   ALT 22 10/08/2022 1044   ALT 19 10/04/2013 2025   ALKPHOS 89 10/08/2022 1044   ALKPHOS 114 10/04/2013 2025   BILITOT 0.8 10/08/2022 1044   BILITOT 0.4 10/04/2013 2025   GFRNONAA >60 06/30/2021 0857   GFRNONAA 83 11/16/2019 1505   GFRAA 96 11/16/2019 1505     Diabetic Labs (most recent): Lab Results  Component Value Date   HGBA1C 6.3 04/28/2023   HGBA1C 6.6 11/03/2022   HGBA1C 6.5 06/25/2022   MICROALBUR 46.3 (H) 10/22/2021   MICROALBUR 30 05/22/2020   MICROALBUR 2.6 10/20/2019     Lipid Panel ( most recent) Lipid Panel     Component Value Date/Time   CHOL 133 10/08/2022 1044   CHOL 180 03/23/2013 0422   TRIG 188 (H) 10/08/2022 1044   TRIG 218 (H) 03/23/2013 0422   HDL 34 (L) 10/08/2022 1044   HDL 21 (L) 03/23/2013 0422   CHOLHDL 3.9 10/08/2022 1044   CHOLHDL 3.5 08/18/2021 1613   VLDL 31 03/28/2021 0852   VLDL 44 (H) 03/23/2013 0422   LDLCALC 67 10/08/2022 1044   LDLCALC 46 08/18/2021 1613   LDLCALC 115 (H) 03/23/2013 0422   LDLDIRECT 176.0 10/18/2018 1406   LABVLDL 32 10/08/2022 1044      Lab Results  Component Value Date   TSH 1.49 08/18/2021   TSH 1.32 11/16/2019   TSH 1.38 10/18/2018   TSH 1.07 06/11/2016   TSH  1.66 03/12/2014   TSH 1.140 11/24/2013   TSH 1.42 02/25/2012   FREET4 1.2 11/16/2019   FREET4 1.00 04/23/2014      Assessment & Plan:   1. Uncontrolled type 2 diabetes mellitus with CAD - Jacob Baker has currently uncontrolled symptomatic type 2 DM since  53 years of age.  He uses freestyle libre device.  AGP report shows 78% time in range, 19% level 1 hyperglycemia, 2% level 2 hyperglycemia.  He does not have hypoglycemia.  His point-of-care A1c is 6.3%, improving.  His insurance did not provide coverage for GLP-1 receptor agonists.     Recent labs reviewed, showing normal renal function. - I had a long discussion with him about the progressive nature of diabetes and the pathology behind its complications. -his diabetes is complicated by CAD s/p stent placement, smoking and he remains at a high risk for more acute and chronic complications which include CVA, CKD, retinopathy, and neuropathy. These are all discussed in detail with him.  - I have counseled him on diet  and weight management  by adopting a carbohydrate restricted/protein rich diet. Patient is encouraged to switch to  unprocessed or minimally processed     complex starch and increased protein intake (animal or plant source), fruits, and vegetables. -  he is advised to stick to a routine mealtimes to eat 3 meals  a day and avoid unnecessary snacks ( to snack only to correct hypoglycemia).   - he acknowledges that there is a room for improvement in his food and drink choices. - Suggestion is made for him to avoid simple  carbohydrates  from his diet including Cakes, Sweet Desserts, Ice Cream, Soda (diet and regular), Sweet Tea, Candies, Chips, Cookies, Store Bought Juices, Alcohol , Artificial Sweeteners,  Coffee Creamer, and "Sugar-free" Products, Lemonade. This will help patient to have more stable blood glucose profile and potentially avoid unintended weight gain.  The following Lifestyle Medicine recommendations according  to American College of Lifestyle Medicine  [facility]) were discussed and and offered to patient and he  agrees to start the journey:  A. Whole Foods, Plant-Based Nutrition comprising of fruits and vegetables, plant-based proteins, whole-grain carbohydrates was discussed in detail with the patient.   A list for source of those nutrients were also provided to the patient.  Patient will use only water or unsweetened tea for hydration. B.  The need to stay away from risky substances including alcohol, smoking; obtaining 7 to 9 hours of restorative sleep, at least 150 minutes of moderate intensity exercise weekly, the importance of healthy social connections,  and stress management techniques were discussed. C.  A full color page of  Calorie density of various food groups per pound showing examples of each food groups was provided to the patient.  - I have approached him with the following individualized plan to manage  his diabetes and patient agrees:   -In light of his response to his current regimen, he will not need insulin treatment.  His insurance did not cover for SGLT2 inhibitors nor GLP-1 receptor agonist. That he is advised to continue metformin 850 mg XR p.o. daily at breakfast as well as glipizide 5 mg XL p.o. daily at breakfast.    He is advised to continue to use his CGM device.  He is advised to call clinic for hypoglycemia below 70 or hyperglycemia above 200 mg per DL weekly average. - Specific targets for  A1c;  LDL, HDL, Triglycerides, and  Waist Circumference were discussed with the patient.  2) Blood Pressure /Hypertension: His blood pressure is controlled to target.  He is advised to continue metoprolol 25 mg p.o. twice daily and lisinopril 20 mg p.o. daily.  During his next visit, he will be considered for stopping metoprolol.      3) Lipids/Hyperlipidemia:   Review of his recent repeat lipid panel showed controlled LDL at 67.  He is advised to continue Crestor 20 mg p.o. nightly.   Side effects and precautions discussed with him.    4)  Weight/Diet:  Body mass index is 26.47 kg/m.  He is not a candidate for major weight loss.  Exercise, and detailed carbohydrates information provided  -  detailed on discharge instructions.  5) Chronic Care/Health Maintenance:  -he  is on  Statin medications and  is encouraged to initiate and continue to follow up with Ophthalmology, Dentist,  Podiatrist at least yearly or according to recommendations, and has successfully quit smoking.    -  I have recommended yearly flu vaccine and pneumonia vaccine at least every 5 years; moderate intensity exercise for up to 150 minutes weekly; and  sleep for at least 7 hours a day.  - he is  advised to maintain close follow up with [name], MD for primary care needs, as well as his other providers for optimal and coordinated care.   I spent  26  minutes in the care of the patient today including review of labs from CMP, Lipids, Thyroid Function, Hematology (current and previous including abstractions from other facilities); face-to-face time discussing  his blood glucose readings/logs, discussing hypoglycemia  and hyperglycemia episodes and symptoms, medications doses, his options of short and long term treatment based on the latest standards of care / guidelines;  discussion about incorporating lifestyle medicine;  and documenting the encounter. Risk reduction counseling performed per USPSTF guidelines to reduce  obesity and cardiovascular risk factors.     Please refer to Patient Instructions for Blood Glucose Monitoring and Insulin/Medications Dosing Guide"  in media tab for additional information. Please  also refer to " Patient Self Inventory" in the Media  tab for reviewed elements of pertinent patient history.  Jacob Baker participated in the discussions, expressed understanding, and voiced agreement with the above plans.  All questions were answered to his satisfaction. he is  encouraged to contact clinic should he have any questions or concerns prior to his return visit.   Follow up plan: - Return in about 6 months (around 10/28/2023) for F/U with Pre-visit Labs, Meter/CGM/Logs, A1c here.  Marquis Lunch, MD [facility] [address] [place], [place] [address] Phone: [phone]  Fax: [phone]    04/28/2023, 2:46 PM  This note was partially dictated with voice recognition software. Similar sounding words can be transcribed inadequately or may not  be corrected upon review.

## 2023-04-30 NOTE — Telephone Encounter (Signed)
 Pt needs PA for [employer] 3 sensors.

## 2023-05-12 ENCOUNTER — Other Ambulatory Visit (HOSPITAL_COMMUNITY): Payer: Self-pay

## 2023-05-12 ENCOUNTER — Telehealth: Payer: Self-pay

## 2023-05-12 NOTE — Telephone Encounter (Signed)
 Per test claim:  Dexcom is preferred by the insurance.  If suggested medication is appropriate, Please send in a new RX and discontinue this one. If not, please advise as to why it's not appropriate so that we may request a Prior Authorization. Please note, some preferred medications may still require a PA.  If the suggested medications have not been trialed and there are no contraindications to their use, the PA will not be submitted, as it will not be approved.

## 2023-05-12 NOTE — Telephone Encounter (Signed)
 Pharmacy Patient Advocate Encounter   Received notification from RX Request Messages that prior authorization for Freestyle Libre CGM sensors is required/requested.   Insurance verification completed.   The patient is insured through [facility] ADVANTAGE/RX ADVANCE .   Per test claim:  Dexcom is preferred by the insurance.  If suggested medication is appropriate, Please send in a new RX and discontinue this one. If not, please advise as to why it's not appropriate so that we may request a Prior Authorization. Please note, some preferred medications may still require a PA.  If the suggested medications have not been trialed and there are no contraindications to their use, the PA will not be submitted, as it will not be approved.

## 2023-05-26 MED ORDER — DEXCOM G7 SENSOR MISC: 2 refills | Status: DC

## 2023-05-26 MED ORDER — DEXCOM G7 RECEIVER DEVI: 0 refills | Status: DC

## 2023-05-26 NOTE — Addendum Note (Signed)
 Addended by: Ernst Heap on: 05/26/2023 12:17 PM   Modules accepted: Orders

## 2023-05-27 ENCOUNTER — Other Ambulatory Visit: Payer: Self-pay | Admitting: "Endocrinology

## 2023-05-27 DIAGNOSIS — E1159 Type 2 diabetes mellitus with other circulatory complications: Secondary | ICD-10-CM

## 2023-06-15 ENCOUNTER — Ambulatory Visit: Payer: BLUE CROSS/BLUE SHIELD | Admitting: Nurse Practitioner

## 2023-06-22 MED ORDER — AMLODIPINE BESYLATE 10 MG PO TABS: ORAL_TABLET | ORAL | 0 refills | Status: DC

## 2023-06-29 ENCOUNTER — Encounter: Payer: Self-pay | Admitting: Nurse Practitioner

## 2023-06-29 ENCOUNTER — Ambulatory Visit: Attending: Nurse Practitioner | Admitting: Nurse Practitioner

## 2023-06-29 VITALS — BP 124/78 | HR 77 | Ht 73.0 in | Wt 201.0 lb

## 2023-06-29 DIAGNOSIS — Z024 Encounter for examination for driving license: Secondary | ICD-10-CM | POA: Diagnosis not present

## 2023-06-29 DIAGNOSIS — I10 Essential (primary) hypertension: Secondary | ICD-10-CM

## 2023-06-29 DIAGNOSIS — E785 Hyperlipidemia, unspecified: Secondary | ICD-10-CM

## 2023-06-29 DIAGNOSIS — I2510 Atherosclerotic heart disease of native coronary artery without angina pectoris: Secondary | ICD-10-CM | POA: Diagnosis not present

## 2023-06-29 NOTE — Patient Instructions (Addendum)

## 2023-06-29 NOTE — Progress Notes (Signed)
 Cardiology Office Note:  .   Date: 06/29/2023 ID:  Jonnie Nettles, DOB 1970/04/01, MRN [id number] PCP: Thersia Flax, MD  [facility] Providers Cardiologist:  Armida Lander, MD    History of Present Illness: .   TORRIE LAFAVOR is a 53 y.o. male with a PMH of CAD, s/p NSTEMI in 2022, HTN, HLD, T2DM, restrictive lung disease, and prior DVT, who presents today for 6 week follow-up.   Previous cardiovascular history includes NSTEMI in 2022.  See cardiac cath report noted below. At the time received drug-eluting stent to D1, staged DES to RCA. Residual left circumflex disease was medically managed.  EF at that time 65 to 70%.  Last seen by Dr. Armida Lander on November 20, 2022.  Patient had noticed some atypical chest pains, had a recent nuclear stress test that was without ischemia.  Patient noted some DOE.  Echocardiogram was arranged and revealed EF 70 to 75%, no RWMA, no significant valvular abnormalities, no significant change from prior study.  Dr. Amanda Jungling recommended that if patient had significant progression of symptoms, may warrant repeat cath.  He had noted a cough on lisinopril , medication was stopped and started on Losartan  12.5 mg daily.   12/23/2022-today he presents for 4-week follow-up.  He states he has had 2 more episodes of chest pain since last office visit. He describes the onset of his chest pain occurring 1 month ago, that occurs along the left side of his chest, denies any radiation, and says taking baby aspirin  helps improve his chest pain. Denies any specific triggers or aggravating factors to his chest pain, says episodes are random in occurrence and episodes are brief (lasting around 1 minute) and stable over time. Rates pain as a 7/10 on 0-10 pain scale. Denies any shortness of breath, palpitations, syncope, presyncope, dizziness, orthopnea, PND, swelling or significant weight changes, acute bleeding, or claudication.  03/18/2023 - Presents today for follow-up.   Says he could not tolerate increased dose of Ranexa  as this made him jittery.  He has returned to taking Ranexa  500 mg twice daily and tolerating this well and denies any issues.  Also confirms to me he is not taking carvedilol  and is now returned to taking Lopressor  25 mg twice daily and is doing much better, and chest pain has improved. Denies any shortness of breath, palpitations, syncope, presyncope, dizziness, orthopnea, PND, swelling or significant weight changes, acute bleeding, or claudication.  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  Tells me he is not able to physically do the treadmill stress test.  06/29/2023 - Here for follow-up.  Doing well and denies any acute cardiac complaints or concerns.  He is requesting a note to be cleared to work. Denies any chest pain, shortness of breath, palpitations, syncope, presyncope, dizziness, orthopnea, PND, swelling or significant weight changes, acute bleeding, or claudication.   ROS: Negative. See HPI.  SH: Works as a [employer].  Studies Reviewed: Aaron Aas    EKG: EKG is not ordered today.   Lexiscan  03/2023:   Stress ECG is negative for ischemia and arrhythmias.   LV perfusion is normal. There is no evidence of ischemia. There is no evidence of infarction.   Left ventricular function is normal. Nuclear stress EF: 63%. End diastolic cavity size is moderately enlarged. End systolic cavity size is normal.   Findings are consistent with no ischemia and no infarction. The study is  low risk.  Echo 11/2022:  1. Left ventricular ejection fraction, by estimation, is 70 to 75%. The  left ventricle has hyperdynamic function. The left ventricle has no  regional wall motion abnormalities. There is mild left ventricular  hypertrophy. Left ventricular diastolic  parameters were normal.   2. Right ventricular systolic function is normal. The right ventricular  size is mildly enlarged. Tricuspid  regurgitation signal is inadequate for  assessing PA pressure.   3. The mitral valve is normal in structure. Trivial mitral valve  regurgitation. No evidence of mitral stenosis.   4. The aortic valve is tricuspid. Aortic valve regurgitation is not  visualized. No aortic stenosis is present.   5. Aortic dilatation noted. There is borderline dilatation of the aortic  root, measuring 38 mm. Normal size of the ascending aorta.   Comparison(s): No significant change from prior study.  Renal artery duplex bilateral 04/2022:  Normal study. No evidence of renal artery stenosis seen bilaterally.   Lexiscan  07/2021:    The study is normal. The study is low risk.   No ST deviation was noted.   LV perfusion is normal.   Left ventricular function is normal. Nuclear stress EF: 63 %. The left ventricular ejection fraction is normal (55-65%). End diastolic cavity size is normal.  Coronary stent intervention 12/2020:    Prox Cx lesion is 85% stenosed.  This was a small vessel that was diffusely diseased.   Prox RCA to Mid RCA lesion is 80% stenosed.   A drug-eluting stent was successfully placed using a STENT ONYX FRONTIER 4.0X18, posdilated to 4.5 mm.   Post intervention, there is a 0% residual stenosis.   Dist RCA lesion is 50% stenosed.   Diagonal stent was widely patent.   LV end diastolic pressure is normal.   There is no aortic valve stenosis.   Successful PCI of RCA.  He needs aggressive secondary prevention, smoking cessation.  Would consider changing Plavix  to Brilinta  since he had ACS.   LHC 12/2020:  CONCLUSIONS: High-grade stenosis in the large mid diagonal branch 99% reduced to 0% with TIMI-3 flow using a 2.0 x 12 Onyx frontier.  Chest discomfort was dissimilar to presenting complaint. Left main is normal LAD has mild diffuse disease Circumflex contains 80 to 90% proximal stenosis.  The vessel is diffusely diseased and gives origin to 2-2 small obtuse marginal branches.  If symptoms  continue, perhaps this proximal lesion should be stented. RCA has a "rosary bead "appearance.  It is a large vessel.  There is 60% mid, 60% distal, diffuse disease in the PDA and LV branches. Left ventriculography demonstrated mid anterior wall hypokinesis.  EF 50%.  LVEDP is normal.   RECOMMENDATIONS:   Aggressive risk factor modification. Smoking cessation. Monitor for recurrent symptoms and consider PCI of circumflex if symptoms do not abate with PCI of this moderate-sized diagonal branch  Physical Exam:   VS:  BP 124/78   Pulse 77   Ht 6\' 1"  (1.854 m)   Wt 201 lb (91.2 kg)   SpO2 96%   BMI 26.52 kg/m    Wt Readings from Last 3 Encounters:  06/29/23 201 lb (91.2 kg)  04/28/23 200 lb 9.6 oz (91 kg)  03/18/23 202 lb (91.6 kg)    GEN: Well nourished, well developed in no acute distress NECK: No JVD; No carotid bruits CARDIAC: S1/S2, RRR, no murmurs, rubs, gallops RESPIRATORY:  Clear to auscultation without rales, wheezing or rhonchi  ABDOMEN: Soft, non-tender, non-distended EXTREMITIES:  No  edema; No deformity   ASSESSMENT AND PLAN: .    CAD, s/p NSTEMI in 2022, DOT renewal Previous cardiovascular history includes NSTEMI in 2022.  See cardiac cath report. At the time received drug-eluting stent to D1, staged DES to RCA. Residual left circumflex disease was medically managed.  EF at that time 65 to 70%. Stable with no anginal symptoms. No indication for ischemic evaluation.  Most recent Lexiscan  in March of this year was low risk. Note will be provided for him to be cleared.  Continue current medication regimen. Heart healthy diet and regular cardiovascular exercise encouraged. Care and ED precautions discussed.   HTN BP stable. Discussed to monitor BP at home at least 2 hours after medications and sitting for 5-10 minutes. No medication changes at this time. Heart healthy diet and regular cardiovascular exercise encouraged.   HLD LDL 67 09/2022. Continue rosuvastatin . Heart  healthy diet and regular cardiovascular exercise encouraged. Continue to follow with PCP.  Has upcoming labs with endocrinology.   Dispo: Follow-up with Dr. Armida Lander or APP in 6 months or sooner if anything changes.   Signed, Lasalle Pointer, NP

## 2023-07-11 ENCOUNTER — Other Ambulatory Visit: Payer: Self-pay | Admitting: "Endocrinology

## 2023-07-11 DIAGNOSIS — Z87891 Personal history of nicotine dependence: Secondary | ICD-10-CM

## 2023-07-19 ENCOUNTER — Other Ambulatory Visit: Payer: Self-pay

## 2023-07-26 ENCOUNTER — Encounter: Payer: Self-pay | Admitting: Internal Medicine

## 2023-07-27 NOTE — Telephone Encounter (Signed)
Pended the referral for your approval.

## 2023-09-05 ENCOUNTER — Other Ambulatory Visit: Payer: Self-pay | Admitting: Nurse Practitioner

## 2023-09-17 ENCOUNTER — Encounter: Payer: Self-pay | Admitting: Internal Medicine

## 2023-09-17 ENCOUNTER — Ambulatory Visit: Admitting: Internal Medicine

## 2023-09-17 VITALS — BP 118/74 | HR 68 | Temp 98.0°F | Ht 73.0 in | Wt 201.2 lb

## 2023-09-17 DIAGNOSIS — E785 Hyperlipidemia, unspecified: Secondary | ICD-10-CM | POA: Diagnosis not present

## 2023-09-17 DIAGNOSIS — Z125 Encounter for screening for malignant neoplasm of prostate: Secondary | ICD-10-CM | POA: Diagnosis not present

## 2023-09-17 DIAGNOSIS — E1169 Type 2 diabetes mellitus with other specified complication: Secondary | ICD-10-CM

## 2023-09-17 DIAGNOSIS — K5909 Other constipation: Secondary | ICD-10-CM

## 2023-09-17 DIAGNOSIS — R112 Nausea with vomiting, unspecified: Secondary | ICD-10-CM | POA: Diagnosis not present

## 2023-09-17 DIAGNOSIS — Z7984 Long term (current) use of oral hypoglycemic drugs: Secondary | ICD-10-CM

## 2023-09-17 DIAGNOSIS — R6881 Early satiety: Secondary | ICD-10-CM

## 2023-09-17 DIAGNOSIS — K5904 Chronic idiopathic constipation: Secondary | ICD-10-CM

## 2023-09-17 DIAGNOSIS — F419 Anxiety disorder, unspecified: Secondary | ICD-10-CM

## 2023-09-17 MED ORDER — HYOSCYAMINE SULFATE 0.125 MG SL SUBL: 0.1250 mg | SUBLINGUAL_TABLET | SUBLINGUAL | 0 refills | Status: DC | PRN

## 2023-09-17 MED ORDER — AMLODIPINE BESYLATE 10 MG PO TABS: ORAL_TABLET | ORAL | 0 refills | Status: DC

## 2023-09-17 MED ORDER — ALPRAZOLAM 0.25 MG PO TABS: 0.2500 mg | ORAL_TABLET | Freq: Two times a day (BID) | ORAL | 0 refills | Status: AC | PRN

## 2023-09-17 MED ORDER — ROSUVASTATIN CALCIUM 40 MG PO TABS: 40.0000 mg | ORAL_TABLET | Freq: Every day | ORAL | 1 refills | Status: DC

## 2023-09-17 NOTE — Patient Instructions (Signed)
 Gastric emptying study and referral to GI are in progress  In the meantime:  Please make sure you are drinking 60 ounces of water  daily   Take the alprazolam  and the hyoscyamine  20 minutes  before meals as a trial.    They both work to relax the gut and prevent cramping

## 2023-09-17 NOTE — Progress Notes (Signed)
 Subjective:  Patient ID: Jacob Baker, male    DOB: May 07, 1970  Age: 53 y.o. MRN: [id number]  CC: The primary encounter diagnosis was Hyperlipidemia associated with type 2 diabetes mellitus (HCC). Diagnoses of Prostate cancer screening, Nausea and vomiting, unspecified vomiting type, Chronic constipation, Anxiety, Chronic idiopathic constipation, and Early satiety were also pertinent to this visit.   HPI Jacob Baker presents for  Chief Complaint  Patient presents with   Acute Visit    Stomach pain & constipation Pain 7/10 Diarrhea sometimes Nauseated sometimes Loss of appetite sometimes    53 yr old male with type 2DM managed by endocrinology,  CAD  s/p stents,  chronic constipation / abdominal pain presents with worsening abdominal pain   and constipation with occasional diarrhea.  Currently describing abdominal pain as 7/10 with sudden loss of appetite  resutling in change of taste and post prandail vomiting if he continues to eat  . He averages one meal daily .  Rarely gets hungry.  Has abdominal pain post prandially  1-2 times per week . Uses tylenol  for pain.    Chronic Constipation: he has a recurrent pattern of constipation (no BM) for 4-5 days  followed by diarrhea.  Uses Trulance  after 4-5 days without stooling because  daily use caused diarrhea.  .    He has not had any weight loss.  Prior workup has included gastric emptying study in 2023 which was normal (>73% emptied at one hour; normal  is > 10%)   Outpatient Medications Prior to Visit  Medication Sig Dispense Refill   aspirin  81 MG EC tablet Take 1 tablet (81 mg total) by mouth daily with breakfast. Swallow whole. 90 tablet 3   carvedilol  (COREG ) 3.125 MG tablet Take 1 tablet (3.125 mg total) by mouth 2 (two) times daily with a meal. 60 tablet 3   Continuous Glucose Receiver (DEXCOM G7 RECEIVER) DEVI Use to monitor glucose continuously as directed. 1 each 0   Continuous Glucose Sensor (DEXCOM G7 SENSOR) MISC Use to  monitor glucose continuously as directed. Change sensor every 10 days. 3 each 2   glipiZIDE  (GLUCOTROL  XL) 5 MG 24 hr tablet TAKE 1 TABLET BY MOUTH EVERY DAY WITH BREAKFAST 90 tablet 1   glucose blood (ONETOUCH VERIO) test strip Test sugars twice daily 50 each 6   losartan  (COZAAR ) 25 MG tablet Take 0.5 tablets (12.5 mg total) by mouth daily. 45 tablet 3   metFORMIN  (GLUCOPHAGE ) 850 MG tablet TAKE 1 TABLET BY MOUTH TWICE DAILY WITH A MEAL 180 tablet 1   metoprolol  tartrate (LOPRESSOR ) 25 MG tablet Take 1 tablet (25 mg total) by mouth 2 (two) times daily. 180 tablet 1   nitroGLYCERIN  (NITROSTAT ) 0.4 MG SL tablet Place 1 tablet (0.4 mg total) under the tongue every 5 (five) minutes as needed for chest pain. 25 tablet 1   ONETOUCH DELICA LANCETS 33G MISC Test sugars twice daily 50 each 3   pantoprazole  (PROTONIX ) 40 MG tablet TAKE 1 TABLET BY MOUTH DAILY 90 tablet 3   ranolazine  (RANEXA ) 1000 MG SR tablet Take 1 tablet (1,000 mg total) by mouth 2 (two) times daily. (Patient taking differently: Take 500 mg by mouth 2 (two) times daily.) 180 tablet 1   vitamin B-12 (CYANOCOBALAMIN) 1000 MCG tablet Take 1,000 mcg by mouth daily.     albuterol  (VENTOLIN  HFA) 108 (90 Base) MCG/ACT inhaler Inhale 2 puffs into the lungs every 6 (six) hours as needed for wheezing or shortness of breath. (Patient  not taking: Reported on 09/17/2023) 8 g 1   amoxicillin -clavulanate (AUGMENTIN ) 875-125 MG tablet Take 1 tablet by mouth every 12 (twelve) hours. (Patient not taking: Reported on 09/17/2023) 14 tablet 0   amLODipine  (NORVASC ) 10 MG tablet TAKE 1 TABLET(10 MG) BY MOUTH DAILY 90 tablet 0   rosuvastatin  (CRESTOR ) 40 MG tablet Take 1 tablet (40 mg total) by mouth daily. 90 tablet 1   No facility-administered medications prior to visit.    Review of Systems;  Patient denies headache, fevers, malaise, unintentional weight loss, skin rash, eye pain, sinus congestion and sinus pain, sore throat, dysphagia,  hemoptysis ,  cough, dyspnea, wheezing, chest pain, palpitations, orthopnea, edema,  flank pain, dysuria, hematuria, urinary  Frequency, nocturia, numbness, tingling, seizures,  Focal weakness, Loss of consciousness,  Tremor, insomnia, depression, anxiety, and suicidal ideation.      Objective:  BP 118/74   Pulse 68   Temp 98 F (36.7 C)   Ht 6' 1 (1.854 m)   Wt 201 lb 3.2 oz (91.3 kg)   SpO2 98%   BMI 26.55 kg/m   BP Readings from Last 3 Encounters:  09/17/23 118/74  06/29/23 124/78  04/28/23 122/70    Wt Readings from Last 3 Encounters:  09/17/23 201 lb 3.2 oz (91.3 kg)  06/29/23 201 lb (91.2 kg)  04/28/23 200 lb 9.6 oz (91 kg)    Physical Exam Vitals reviewed.  Constitutional:      General: He is not in acute distress.    Appearance: Normal appearance. He is normal weight. He is not ill-appearing, toxic-appearing or diaphoretic.  HENT:     Head: Normocephalic.  Eyes:     General: No scleral icterus.       Right eye: No discharge.        Left eye: No discharge.     Conjunctiva/sclera: Conjunctivae normal.  Cardiovascular:     Rate and Rhythm: Normal rate and regular rhythm.     Heart sounds: Normal heart sounds.  Pulmonary:     Effort: Pulmonary effort is normal. No respiratory distress.     Breath sounds: Normal breath sounds.  Musculoskeletal:        General: Normal range of motion.     Cervical back: Normal range of motion.  Skin:    General: Skin is warm and dry.  Neurological:     General: No focal deficit present.     Mental Status: He is alert and oriented to person, place, and time. Mental status is at baseline.  Psychiatric:        Mood and Affect: Mood normal.        Behavior: Behavior normal.        Thought Content: Thought content normal.        Judgment: Judgment normal.     Lab Results  Component Value Date   HGBA1C 6.3 04/28/2023   HGBA1C 6.6 11/03/2022   HGBA1C 6.5 06/25/2022    Lab Results  Component Value Date   CREATININE 1.16 10/08/2022    CREATININE 1.05 03/05/2022   CREATININE 1.17 08/18/2021    Lab Results  Component Value Date   WBC 6.5 08/18/2021   HGB 10.6 (L) 08/18/2021   HCT 30.2 (L) 08/18/2021   PLT 250.0 08/18/2021   GLUCOSE 171 (H) 10/08/2022   CHOL 109 09/17/2023   TRIG 186 (H) 09/17/2023   HDL 36 (L) 09/17/2023   LDLDIRECT 176.0 10/18/2018   LDLCALC 48 09/17/2023   ALT 22 10/08/2022   AST  16 10/08/2022   NA 144 10/08/2022   K 4.3 10/08/2022   CL 106 10/08/2022   CREATININE 1.16 10/08/2022   BUN 11 10/08/2022   CO2 22 10/08/2022   TSH 1.49 08/18/2021   PSA 0.95 09/17/2023   INR 0.96 10/13/2011   HGBA1C 6.3 04/28/2023   MICROALBUR 30 05/22/2020    NM Myocar Multi W/Spect Marisela Motion / EF Result Date: 03/30/2023   Stress ECG is negative for ischemia and arrhythmias.   LV perfusion is normal. There is no evidence of ischemia. There is no evidence of infarction.   Left ventricular function is normal. Nuclear stress EF: 63%. End diastolic cavity size is moderately enlarged. End systolic cavity size is normal.   Findings are consistent with no ischemia and no infarction. The study is low risk.    Assessment & Plan:  .Hyperlipidemia associated with type 2 diabetes mellitus (HCC) -     Comprehensive metabolic panel with GFR; Future -     Lipid panel  Prostate cancer screening -     PSA  Nausea and vomiting, unspecified vomiting type -     Ambulatory referral to Gastroenterology -     NM GASTRIC EMPTYING; Future  Chronic constipation -     Ambulatory referral to Gastroenterology  Anxiety Assessment & Plan: Aggravated by health concerns.  TRIAL OF ALPRAZOLAM  AND HYOSCYAMINE  FOR PRANDIAL SYMPTOMS.    Chronic idiopathic constipation Assessment & Plan: He did not tolerate Trulance  on a daily basis due to persistent diarrhea.  Recommend adding a daily  BFL and follow up with gastroenterology   Early satiety Assessment & Plan: Gastric emptying study  was normal in 2023 but will be repeated      Other orders -     amLODIPine  Besylate; TAKE 1 TABLET(10 MG) BY MOUTH DAILY  Dispense: 90 tablet; Refill: 0 -     Rosuvastatin  Calcium ; Take 1 tablet (40 mg total) by mouth daily.  Dispense: 90 tablet; Refill: 1 -     ALPRAZolam ; Take 1 tablet (0.25 mg total) by mouth 2 (two) times daily as needed for anxiety. Before meals  Dispense: 30 tablet; Refill: 0 -     Hyoscyamine  Sulfate; Place 1 tablet (0.125 mg total) under the tongue every 4 (four) hours as needed for cramping.  Dispense: 30 tablet; Refill: 0     I spent 34 minutes on the day of this face to face encounter reviewing patient's  most recent visit with cardiology,  nephrology,  and neurology,  prior relevant surgical and non surgical procedures, recent  labs and imaging studies, counseling on weight management,  reviewing the assessment and plan with patient, and post visit ordering and reviewing of  diagnostics and therapeutics with patient  .   Follow-up: No follow-ups on file.   Verneita LITTIE Kettering, MD

## 2023-09-18 LAB — LIPID PANEL
Cholesterol: 109 mg/dL (ref <200)
HDL: 36 mg/dL — ABNORMAL LOW (ref >=40)
LDL Cholesterol (Calc): 48 mg/dL
Non-HDL Cholesterol (Calc): 73 mg/dL (ref <130)
Total CHOL/HDL Ratio: 3 (calc) (ref <5.0)
Triglycerides: 186 mg/dL — ABNORMAL HIGH (ref <150)

## 2023-09-18 LAB — PSA: PSA: 0.95 ng/mL (ref <=4.00)

## 2023-09-19 ENCOUNTER — Ambulatory Visit: Payer: Self-pay | Admitting: Internal Medicine

## 2023-09-19 NOTE — Assessment & Plan Note (Signed)
 Gastric emptying study  was normal in 2023 but will be repeated

## 2023-09-19 NOTE — Assessment & Plan Note (Signed)
 Aggravated by health concerns.  TRIAL OF ALPRAZOLAM  AND HYOSCYAMINE  FOR PRANDIAL SYMPTOMS.

## 2023-09-19 NOTE — Assessment & Plan Note (Signed)
 He did not tolerate Trulance  on a daily basis due to persistent diarrhea.  Recommend adding a daily  BFL and follow up with gastroenterology

## 2023-10-07 ENCOUNTER — Other Ambulatory Visit: Payer: Self-pay | Admitting: Family

## 2023-10-08 ENCOUNTER — Ambulatory Visit
Admission: RE | Admit: 2023-10-08 | Discharge: 2023-10-08 | Disposition: A | Discharge status: Home / Self Care | Payer: Self-pay | Source: Ambulatory Visit | Attending: Internal Medicine | Admitting: Internal Medicine

## 2023-10-08 DIAGNOSIS — R112 Nausea with vomiting, unspecified: Secondary | ICD-10-CM | POA: Insufficient documentation

## 2023-10-08 MED ORDER — TECHNETIUM TC 99M SULFUR COLLOID
1.9500 | Freq: Once | INTRAVENOUS | Status: AC | PRN
  Administered 2023-10-08: 1.95 via ORAL

## 2023-10-11 ENCOUNTER — Other Ambulatory Visit: Payer: Self-pay

## 2023-10-11 ENCOUNTER — Encounter: Payer: Self-pay | Admitting: "Endocrinology

## 2023-10-11 DIAGNOSIS — E1159 Type 2 diabetes mellitus with other circulatory complications: Secondary | ICD-10-CM

## 2023-10-11 MED ORDER — METOPROLOL TARTRATE 25 MG PO TABS: 25.0000 mg | ORAL_TABLET | Freq: Two times a day (BID) | ORAL | 1 refills | Status: AC

## 2023-10-11 MED ORDER — GLIPIZIDE ER 5 MG PO TB24: 5.0000 mg | ORAL_TABLET | Freq: Every day | ORAL | 0 refills | Status: DC

## 2023-10-16 LAB — COMPREHENSIVE METABOLIC PANEL WITH GFR
ALT: 15 IU/L (ref 0–44)
AST: 13 IU/L (ref 0–40)
Albumin: 4.4 g/dL (ref 3.8–4.9)
Alkaline Phosphatase: 73 IU/L (ref 47–123)
BUN/Creatinine Ratio: 9 (ref 9–20)
BUN: 11 mg/dL (ref 6–24)
Bilirubin Total: 0.6 mg/dL (ref 0.0–1.2)
CO2: 20 mmol/L (ref 20–29)
Calcium: 9.1 mg/dL (ref 8.7–10.2)
Chloride: 106 mmol/L (ref 96–106)
Creatinine, Ser: 1.22 mg/dL (ref 0.76–1.27)
Globulin, Total: 1.9 g/dL (ref 1.5–4.5)
Glucose: 186 mg/dL — ABNORMAL HIGH (ref 70–99)
Potassium: 4.5 mmol/L (ref 3.5–5.2)
Sodium: 142 mmol/L (ref 134–144)
Total Protein: 6.3 g/dL (ref 6.0–8.5)
eGFR: 70 mL/min/1.73 (ref >59)

## 2023-10-16 LAB — LIPID PANEL
Chol/HDL Ratio: 3.9 ratio (ref 0.0–5.0)
Cholesterol, Total: 120 mg/dL (ref 100–199)
HDL: 31 mg/dL — ABNORMAL LOW (ref >39)
LDL Chol Calc (NIH): 51 mg/dL (ref 0–99)
Triglycerides: 238 mg/dL — ABNORMAL HIGH (ref 0–149)
VLDL Cholesterol Cal: 38 mg/dL (ref 5–40)

## 2023-10-26 ENCOUNTER — Other Ambulatory Visit: Payer: Self-pay

## 2023-10-28 ENCOUNTER — Ambulatory Visit: Admitting: "Endocrinology

## 2023-10-28 ENCOUNTER — Encounter: Payer: Self-pay | Admitting: "Endocrinology

## 2023-10-28 VITALS — BP 114/66 | HR 72 | Ht 73.0 in | Wt 203.8 lb

## 2023-10-28 DIAGNOSIS — E1159 Type 2 diabetes mellitus with other circulatory complications: Secondary | ICD-10-CM

## 2023-10-28 DIAGNOSIS — E782 Mixed hyperlipidemia: Secondary | ICD-10-CM | POA: Diagnosis not present

## 2023-10-28 DIAGNOSIS — Z7984 Long term (current) use of oral hypoglycemic drugs: Secondary | ICD-10-CM | POA: Diagnosis not present

## 2023-10-28 DIAGNOSIS — I10 Essential (primary) hypertension: Secondary | ICD-10-CM

## 2023-10-28 LAB — POCT GLYCOSYLATED HEMOGLOBIN (HGB A1C): HbA1c, POC (controlled diabetic range): 6.2 % (ref 0.0–7.0)

## 2023-10-28 NOTE — Patient Instructions (Signed)

## 2023-10-28 NOTE — Progress Notes (Signed)
 10/28/2023, 4:28 PM   Endocrinology follow-up note  Subjective:    Patient ID: Jacob Baker, male    DOB: 1970-02-23.  Jacob Baker is being seen in follow-up after he was seen in consultation for management of currently uncontrolled symptomatic diabetes requested by  Marylynn Verneita CROME, MD.   Past Medical History:  Diagnosis Date   (HFpEF) heart failure with preserved ejection fraction (HCC)    a. 12/2020 Echo: EF 65-70%, no rwma, mild LVH, nl RV fxn, triv MR.   CAD (coronary artery disease)    a. 12/2020 NSTEMI/PCI: LM nl, LAD min irregs, D1 99 (2.0x12 Onyx Frontier DES), LCX 85p (small), RCA 80p/m (4.0x18 Onyx Frontier DES - staged following day), 60d, RPDA mod dzs. EF 50-55%.   DVT (deep venous thrombosis) (HCC) 04/2017   GERD (gastroesophageal reflux disease)    History of migraine    Hyperlipidemia    Hypertension    Long term current use of antithrombotics/antiplatelets    a.) on daily DAPT therapy (ticagrelor  + ASA)   Non-STEMI (non-ST elevated myocardial infarction) (HCC) 12/30/2020   Syncope and collapse    a.) single episode; related to dehydration   T2DM (type 2 diabetes mellitus) (HCC)     Past Surgical History:  Procedure Laterality Date   CHOLECYSTECTOMY     COLONOSCOPY     COLONOSCOPY WITH PROPOFOL  N/A 08/06/2017   Procedure: COLONOSCOPY WITH PROPOFOL ;  Surgeon: Jinny Carmine, MD;  Location: [facility];  Service: Endoscopy;  Laterality: N/A;  Diabetic - oral meds   CORONARY STENT INTERVENTION N/A 12/31/2020   Procedure: CORONARY STENT INTERVENTION;  Surgeon: Claudene Victory ORN, MD;  Location: [facility] INVAS[facility] CV LAB;  Service: Cardiovascular;  Laterality: N/A;   CORONARY STENT INTERVENTION N/A 01/01/2021   Procedure: CORONARY STENT INTERVENTION;  Surgeon: Dann Candyce RAMAN, MD;  Location: [facility] INVASIVE CV LAB;  Service: Cardiovascular;  Laterality: N/A;   ESOPHAGEAL DILATION   09/24/2016   Procedure: ESOPHAGEAL DILATION;  Surgeon: Jinny Carmine, MD;  Location: [facility];  Service: Gastroenterology;;   ESOPHAGOGASTRODUODENOSCOPY (EGD) WITH PROPOFOL  N/A 04/08/2015   Procedure: ESOPHAGOGASTRODUODENOSCOPY (EGD) WITH PROPOFOL  with dialation;  Surgeon: Carmine Jinny, MD;  Location: [facility];  Service: Endoscopy;  Laterality: N/A;  diabetic - oral meds   ESOPHAGOGASTRODUODENOSCOPY (EGD) WITH PROPOFOL  N/A 09/24/2016   Procedure: ESOPHAGOGASTRODUODENOSCOPY (EGD) WITH PROPOFOL  WITH DILATION;  Surgeon: Jinny Carmine, MD;  Location: [facility];  Service: Gastroenterology;  Laterality: N/A;  Diabetic - oral meds   FINGER AMPUTATION  01/27/2007   partial removal left index finger    LEFT HEART CATH AND CORONARY ANGIOGRAPHY N/A 12/31/2020   Procedure: LEFT HEART CATH AND CORONARY ANGIOGRAPHY;  Surgeon: Claudene Victory ORN, MD;  Location: [facility]VAS[facility] CV [facility];  Service: Cardiovascular;  Laterality: N/A;   LEFT HEART CATH AND CORONARY ANGIOGRAPHY N/A 01/01/2021   Procedure: LEFT HEART CATH AND CORONARY ANGIOGRAPHY;  Surgeon: Dann Candyce RAMAN, MD;  Location: [facility] INVASIVE CV LAB;  Service: Cardiovascular;  Laterality: N/A;   POLYPECTOMY  08/06/2017   Procedure: POLYPECTOMY INTESTINAL;  Surgeon: Jinny Carmine, MD;  Location: [facility];  Service: Endoscopy;;  Social History   Socioeconomic History   Marital status: Married    Spouse name: Jacob Baker   Number of children: 4   Years of education: Not on file   Highest education level: Not on file  Occupational History   Not on file  Tobacco Use   Smoking status: Former    Current packs/day: 0.00    Average packs/day: 2.0 packs/day for 27.0 years (54.0 ttl pk-yrs)    Types: Cigarettes    Start date: 12/30/1993    Quit date: 12/30/2020    Years since quitting: 2.8    Passive exposure: Never   Smokeless tobacco: Never  Vaping Use   Vaping status: Never Used  Substance and Sexual Activity   Alcohol  use: No   Drug use: No   Sexual activity: Not on file  Other Topics Concern   Not on file  Social History Narrative   Not on file   Social Drivers of Health   Financial Resource Strain: Not on file  Food Insecurity: Not on file  Transportation Needs: Not on file  Physical Activity: Not on file  Stress: Not on file  Social Connections: Not on file    Family History  Problem Relation Age of Onset   Hypertension Mother    Heart disease Father        CAD   Hypertension Father    Diabetes Father    Hypertension Sister    Hypertension Brother    Hypertension Brother     Outpatient Encounter Medications as of 10/28/2023  Medication Sig   albuterol  (VENTOLIN  HFA) 108 (90 Base) MCG/ACT inhaler Inhale 2 puffs into the lungs every 6 (six) hours as needed for wheezing or shortness of breath. (Patient not taking: Reported on 09/17/2023)   ALPRAZolam  (XANAX ) 0.25 MG tablet Take 1 tablet (0.25 mg total) by mouth 2 (two) times daily as needed for anxiety. Before meals   amLODipine  (NORVASC ) 10 MG tablet TAKE 1 TABLET(10 MG) BY MOUTH DAILY   amoxicillin -clavulanate (AUGMENTIN ) 875-125 MG tablet Take 1 tablet by mouth every 12 (twelve) hours. (Patient not taking: Reported on 09/17/2023)   aspirin  81 MG EC tablet Take 1 tablet (81 mg total) by mouth daily with breakfast. Swallow whole.   Continuous Glucose Receiver (DEXCOM G7 RECEIVER) DEVI Use to monitor glucose continuously as directed. (Patient not taking: Reported on 10/28/2023)   Continuous Glucose Sensor (DEXCOM G7 SENSOR) MISC Use to monitor glucose continuously as directed. Change sensor every 10 days. (Patient not taking: Reported on 10/28/2023)   glucose blood (ONETOUCH VERIO) test strip Test sugars twice daily   hyoscyamine  (LEVSIN  SL) 0.125 MG SL tablet Place 1 tablet (0.125 mg total) under the tongue every 4 (four) hours as needed for cramping.   losartan  (COZAAR ) 25 MG tablet Take 0.5 tablets (12.5 mg total) by mouth daily.    metFORMIN  (GLUCOPHAGE ) 850 MG tablet TAKE 1 TABLET BY MOUTH TWICE DAILY WITH A MEAL   metoprolol  tartrate (LOPRESSOR ) 25 MG tablet Take 1 tablet (25 mg total) by mouth 2 (two) times daily.   nitroGLYCERIN  (NITROSTAT ) 0.4 MG SL tablet Place 1 tablet (0.4 mg total) under the tongue every 5 (five) minutes as needed for chest pain.   ONETOUCH DELICA LANCETS 33G MISC Test sugars twice daily   pantoprazole  (PROTONIX ) 40 MG tablet TAKE 1 TABLET BY MOUTH DAILY   ranolazine  (RANEXA ) 1000 MG SR tablet Take 1 tablet (1,000 mg total) by mouth 2 (two) times daily. (Patient taking differently: Take 500 mg  by mouth 2 (two) times daily.)   rosuvastatin  (CRESTOR ) 40 MG tablet Take 1 tablet (40 mg total) by mouth daily.   vitamin B-12 (CYANOCOBALAMIN) 1000 MCG tablet Take 1,000 mcg by mouth daily.   [DISCONTINUED] glipiZIDE  (GLUCOTROL  XL) 5 MG 24 hr tablet Take 1 tablet (5 mg total) by mouth daily with breakfast.   No facility-administered encounter medications on file as of 10/28/2023.    ALLERGIES: No Known Allergies  VACCINATION STATUS: Immunization History  Administered Date(s) Administered   Dtap, Unspecified 08/26/1971   Influenza Split 12/15/2013   Influenza, Seasonal, Injecte, Preservative Fre 10/23/2022   Influenza,inj,Quad PF,6+ Mos 10/26/2014, 10/14/2015, 10/13/2017, 10/18/2018, 10/20/2019, 10/21/2020, 10/21/2021   Influenza-Unspecified 09/26/2016   Moderna Sars-Covid-2 Vaccination 04/28/2019, 05/30/2019   PNEUMOCOCCAL CONJUGATE-20 10/21/2021   Pneumococcal Polysaccharide-23 10/14/2015   Polio, Unspecified 08/26/1971   Tdap 10/26/2014    Diabetes He presents for his follow-up diabetic visit. He has type 2 diabetes mellitus. His disease course has been improving. There are no hypoglycemic associated symptoms. Pertinent negatives for hypoglycemia include no confusion, headaches, pallor or seizures. Pertinent negatives for diabetes include no chest pain, no fatigue, no polydipsia, no polyphagia,  no polyuria and no weakness. Symptoms are improving. Diabetic complications include heart disease. (In the interim , he developed chest pain, hospitalized and diagnosed with ST Elevation MI. He is s/p stent placement.) Risk factors for coronary artery disease include dyslipidemia, diabetes mellitus, male sex, family history and tobacco exposure. Current diabetic treatment includes oral agent (triple therapy). His weight is fluctuating minimally. He is following a generally unhealthy diet. When asked about meal planning, he reported none. He has not had a previous visit with a dietitian. He rarely participates in exercise. His home blood glucose trend is decreasing steadily. His breakfast blood glucose range is generally 130-140 mg/dl. His lunch blood glucose range is generally 140-180 mg/dl. His dinner blood glucose range is generally 140-180 mg/dl. His bedtime blood glucose range is generally 130-140 mg/dl. His overall blood glucose range is 140-180 mg/dl. (He uses freestyle libre device.  AGP report shows 78,  time in range, 19% level 1 hyperglycemia, 2% level 2 hyperglycemia.  He has 1% hypoglycemia happening nocturnally.   His average blood glucose is 146 mg per DL.  His point-of-care A1c is 6.2%. )  Hyperlipidemia This is a chronic problem. The current episode started more than 1 year ago. The problem is uncontrolled. Exacerbating diseases include diabetes. Pertinent negatives include no chest pain, myalgias or shortness of breath. Current antihyperlipidemic treatment includes statins. Risk factors for coronary artery disease include diabetes mellitus, dyslipidemia, male sex and family history.  Hypertension This is a chronic problem. The current episode started more than 1 year ago. The problem is controlled. Pertinent negatives include no chest pain, headaches, neck pain, palpitations or shortness of breath. Risk factors for coronary artery disease include family history, dyslipidemia, diabetes mellitus,  male gender and smoking/tobacco exposure. Past treatments include calcium  channel blockers. Hypertensive end-organ damage includes CAD/MI.    Objective:    BP 114/66   Pulse 72   Ht 6' 1 (1.854 m)   Wt 203 lb 12.8 oz (92.4 kg)   BMI 26.89 kg/m   Wt Readings from Last 3 Encounters:  10/28/23 203 lb 12.8 oz (92.4 kg)  09/17/23 201 lb 3.2 oz (91.3 kg)  06/29/23 201 lb (91.2 kg)     CMP ( most recent) CMP     Component Value Date/Time   NA 142 10/15/2023 1041   NA 138  10/04/2013 2025   K 4.5 10/15/2023 1041   K 3.0 (L) 10/04/2013 2025   CL 106 10/15/2023 1041   CL 104 10/04/2013 2025   CO2 20 10/15/2023 1041   CO2 25 10/04/2013 2025   GLUCOSE 186 (H) 10/15/2023 1041   GLUCOSE 117 (H) 08/18/2021 1613   GLUCOSE 175 (H) 10/04/2013 2025   BUN 11 10/15/2023 1041   BUN 13 10/04/2013 2025   CREATININE 1.22 10/15/2023 1041   CREATININE 1.04 11/16/2019 1505   CALCIUM  9.1 10/15/2023 1041   CALCIUM  9.2 10/04/2013 2025   PROT 6.3 10/15/2023 1041   PROT 7.4 10/04/2013 2025   ALBUMIN 4.4 10/15/2023 1041   ALBUMIN 4.1 10/04/2013 2025   AST 13 10/15/2023 1041   AST 8 (L) 10/04/2013 2025   ALT 15 10/15/2023 1041   ALT 19 10/04/2013 2025   ALKPHOS 73 10/15/2023 1041   ALKPHOS 114 10/04/2013 2025   BILITOT 0.6 10/15/2023 1041   BILITOT 0.4 10/04/2013 2025   GFRNONAA >60 06/30/2021 0857   GFRNONAA 83 11/16/2019 1505   GFRAA 96 11/16/2019 1505     Diabetic Labs (most recent): Lab Results  Component Value Date   HGBA1C 6.2 10/28/2023   HGBA1C 6.3 04/28/2023   HGBA1C 6.6 11/03/2022   MICROALBUR 30 05/22/2020   MICROALBUR 2.6 10/20/2019   MICROALBUR 1.8 03/10/2019     Lipid Panel ( most recent) Lipid Panel     Component Value Date/Time   CHOL 120 10/15/2023 1041   CHOL 180 03/23/2013 0422   TRIG 238 (H) 10/15/2023 1041   TRIG 218 (H) 03/23/2013 0422   HDL 31 (L) 10/15/2023 1041   HDL 21 (L) 03/23/2013 0422   CHOLHDL 3.9 10/15/2023 1041   CHOLHDL 3.0 09/17/2023 1616    VLDL 31 03/28/2021 0852   VLDL 44 (H) 03/23/2013 0422   LDLCALC 51 10/15/2023 1041   LDLCALC 48 09/17/2023 1616   LDLCALC 115 (H) 03/23/2013 0422   LDLDIRECT 176.0 10/18/2018 1406   LABVLDL 38 10/15/2023 1041      Lab Results  Component Value Date   TSH 1.49 08/18/2021   TSH 1.32 11/16/2019   TSH 1.38 10/18/2018   TSH 1.07 06/11/2016   TSH 1.66 03/12/2014   TSH 1.140 11/24/2013   TSH 1.42 02/25/2012   FREET4 1.2 11/16/2019   FREET4 1.00 04/23/2014      Assessment & Plan:   1. Uncontrolled type 2 diabetes mellitus with CAD - ROWEN WILMER has currently uncontrolled symptomatic type 2 DM since  53 years of age.  He uses freestyle libre device.  AGP report shows 78,  time in range, 19% level 1 hyperglycemia, 2% level 2 hyperglycemia.  He has 1% hypoglycemia happening nocturnally.   His average blood glucose is 146 mg per DL.  His point-of-care A1c is 6.2%.  His insurance did not provide coverage for GLP-1 receptor agonists.    Recent labs reviewed, showing normal renal function. - I had a long discussion with him about the progressive nature of diabetes and the pathology behind its complications. -his diabetes is complicated by CAD s/p stent placement, smoking and he remains at a high risk for more acute and chronic complications which include CVA, CKD, retinopathy, and neuropathy. These are all discussed in detail with him.  - I have counseled him on diet  and weight management  by adopting a carbohydrate restricted/protein rich diet. Patient is encouraged to switch to  unprocessed or minimally processed     complex starch  and increased protein intake (animal or plant source), fruits, and vegetables. -  he is advised to stick to a routine mealtimes to eat 3 meals  a day and avoid unnecessary snacks ( to snack only to correct hypoglycemia).   - he acknowledges that there is a room for improvement in his food and drink choices. - Suggestion is made for him to avoid simple  carbohydrates  from his diet including Cakes, Sweet Desserts, Ice Cream, Soda (diet and regular), Sweet Tea, Candies, Chips, Cookies, Store Bought Juices, Alcohol , Artificial Sweeteners,  Coffee Creamer, and Sugar-free Products, Lemonade. This will help patient to have more stable blood glucose profile and potentially avoid unintended weight gain.  The following Lifestyle Medicine recommendations according to American College of Lifestyle Medicine  [facility]) were discussed and and offered to patient and he  agrees to start the journey:  A. Whole Foods, Plant-Based Nutrition comprising of fruits and vegetables, plant-based proteins, whole-grain carbohydrates was discussed in detail with the patient.   A list for source of those nutrients were also provided to the patient.  Patient will use only water  or unsweetened tea for hydration. B.  The need to stay away from risky substances including alcohol, smoking; obtaining 7 to 9 hours of restorative sleep, at least 150 minutes of moderate intensity exercise weekly, the importance of healthy social connections,  and stress management techniques were discussed. C.  A full color page of  Calorie density of various food groups per pound showing examples of each food groups was provided to the patient.  - I have approached him with the following individualized plan to manage  his diabetes and patient agrees:   -In light of his response to his current regimen, and presentation with rare, random hypoglycemia, he is advised to discontinue glipizide  for now.    He will not need insulin  treatment.  His insurance did not cover for SGLT2 inhibitors nor GLP-1 receptor agonist. He  is advised to continue metformin  850 mg twice a day with breakfast and supper.   He is advised to continue to use his CGM device, as long as he has access for the sensors.    He is advised to call clinic for hypoglycemia below 70 or hyperglycemia above 200 mg per DL weekly average. -  Specific targets for  A1c;  LDL, HDL, Triglycerides, and  Waist Circumference were discussed with the patient.  2) Blood Pressure /Hypertension: -His blood pressure is controlled currently.  He is advised to continue metoprolol  25 mg p.o. twice daily and lisinopril  20 mg p.o. daily.  During his next visit, he will be considered for stopping metoprolol .    3) Lipids/Hyperlipidemia:   Review of his recent repeat lipid panel showed controlled LDL at 48, triglycerides high at 238.  He is advised to continue Crestor  20 mg p.o. nightly.  Side effects and precautions discussed with him.    4)  Weight/Diet:  Body mass index is 26.89 kg/m.  He is not a candidate for major weight loss.  Exercise, and detailed carbohydrates information provided  -  detailed on discharge instructions.  5) Chronic Care/Health Maintenance:  -he  is on  Statin medications and  is encouraged to initiate and continue to follow up with Ophthalmology, Dentist,  Podiatrist at least yearly or according to recommendations, and has successfully quit smoking.    -  I have recommended yearly flu vaccine and pneumonia vaccine at least every 5 years; moderate intensity exercise for up to 150  minutes weekly; and  sleep for at least 7 hours a day.  - he is  advised to maintain close follow up with Tullo, Teresa L, MD for primary care needs, as well as his other providers for optimal and coordinated care.   I spent  26  minutes in the care of the patient today including review of labs from CMP, Lipids, Thyroid  Function, Hematology (current and previous including abstractions from other facilities); face-to-face time discussing  his blood glucose readings/logs, discussing hypoglycemia and hyperglycemia episodes and symptoms, medications doses, his options of short and long term treatment based on the latest standards of care / guidelines;  discussion about incorporating lifestyle medicine;  and documenting the encounter. Risk reduction  counseling performed per USPSTF guidelines to reduce  cardiovascular risk factors.     Please refer to Patient Instructions for Blood Glucose Monitoring and Insulin /Medications Dosing Guide  in media tab for additional information. Please  also refer to  Patient Self Inventory in the Media  tab for reviewed elements of pertinent patient history.  Jacob Baker participated in the discussions, expressed understanding, and voiced agreement with the above plans.  All questions were answered to his satisfaction. he is encouraged to contact clinic should he have any questions or concerns prior to his return visit.   Follow up plan: - Return in about 4 months (around 02/28/2024) for Bring Meter/CGM Device/Logs- A1c in Office.  Ranny Earl, MD [facility] [address] [place], [place] [address] Phone: [phone]  Fax: [phone]    10/28/2023, 4:28 PM  This note was partially dictated with voice recognition software. Similar sounding words can be transcribed inadequately or may not  be corrected upon review.

## 2023-10-29 ENCOUNTER — Ambulatory Visit: Admission: RE | Admit: 2023-10-29 | Source: Ambulatory Visit

## 2023-11-03 ENCOUNTER — Encounter: Payer: Self-pay | Admitting: Internal Medicine

## 2023-11-03 ENCOUNTER — Ambulatory Visit: Payer: Self-pay | Admitting: Gastroenterology

## 2023-11-03 ENCOUNTER — Encounter: Payer: Self-pay | Admitting: Gastroenterology

## 2023-11-03 NOTE — Progress Notes (Deleted)
 GI Office Note    Referring Provider: Marylynn Verneita CROME, MD Primary Care Physician:  Marylynn Verneita CROME, MD  Primary Gastroenterologist: formerly Dr. Jinny  Chief Complaint   No chief complaint on file.    History of Present Illness   Jacob Baker is a 53 y.o. male presenting today for further evaluation of N/V, chronic constipation with occasional diarrhea at the request of Dr. Marylynn. Last seen by Dr. Jinny in 2023.   GES was scheduled by Dr. Marylynn, patient cancelled.   Uses Trulance  after 4-5 days without stooling because daily use causing diarrhea.  GES 2023 normal (>72% emptied at one hour)    Prior Data   Colonoscopy 07/2017: -one 7mm polyp rectum -three 2-3 mm polyps in sigmoid colon -one 6mm polyps sigmoid colon -next colonoscopy 10 years due to non precancerous polyps  EGD 08/2016: -normal esophagus s/p dilation -normal stomach -normal duodneum  Medications   Current Outpatient Medications  Medication Sig Dispense Refill   albuterol  (VENTOLIN  HFA) 108 (90 Base) MCG/ACT inhaler Inhale 2 puffs into the lungs every 6 (six) hours as needed for wheezing or shortness of breath. (Patient not taking: Reported on 09/17/2023) 8 g 1   ALPRAZolam  (XANAX ) 0.25 MG tablet Take 1 tablet (0.25 mg total) by mouth 2 (two) times daily as needed for anxiety. Before meals 30 tablet 0   amLODipine  (NORVASC ) 10 MG tablet TAKE 1 TABLET(10 MG) BY MOUTH DAILY 90 tablet 0   amoxicillin -clavulanate (AUGMENTIN ) 875-125 MG tablet Take 1 tablet by mouth every 12 (twelve) hours. (Patient not taking: Reported on 09/17/2023) 14 tablet 0   aspirin  81 MG EC tablet Take 1 tablet (81 mg total) by mouth daily with breakfast. Swallow whole. 90 tablet 3   Continuous Glucose Receiver (DEXCOM G7 RECEIVER) DEVI Use to monitor glucose continuously as directed. (Patient not taking: Reported on 10/28/2023) 1 each 0   Continuous Glucose Sensor (DEXCOM G7 SENSOR) MISC Use to monitor glucose continuously as  directed. Change sensor every 10 days. (Patient not taking: Reported on 10/28/2023) 3 each 2   glucose blood (ONETOUCH VERIO) test strip Test sugars twice daily 50 each 6   hyoscyamine  (LEVSIN  SL) 0.125 MG SL tablet Place 1 tablet (0.125 mg total) under the tongue every 4 (four) hours as needed for cramping. 30 tablet 0   losartan  (COZAAR ) 25 MG tablet Take 0.5 tablets (12.5 mg total) by mouth daily. 45 tablet 3   metFORMIN  (GLUCOPHAGE ) 850 MG tablet TAKE 1 TABLET BY MOUTH TWICE DAILY WITH A MEAL 180 tablet 1   metoprolol  tartrate (LOPRESSOR ) 25 MG tablet Take 1 tablet (25 mg total) by mouth 2 (two) times daily. 180 tablet 1   nitroGLYCERIN  (NITROSTAT ) 0.4 MG SL tablet Place 1 tablet (0.4 mg total) under the tongue every 5 (five) minutes as needed for chest pain. 25 tablet 1   ONETOUCH DELICA LANCETS 33G MISC Test sugars twice daily 50 each 3   pantoprazole  (PROTONIX ) 40 MG tablet TAKE 1 TABLET BY MOUTH DAILY 90 tablet 3   ranolazine  (RANEXA ) 1000 MG SR tablet Take 1 tablet (1,000 mg total) by mouth 2 (two) times daily. (Patient taking differently: Take 500 mg by mouth 2 (two) times daily.) 180 tablet 1   rosuvastatin  (CRESTOR ) 40 MG tablet Take 1 tablet (40 mg total) by mouth daily. 90 tablet 1   vitamin B-12 (CYANOCOBALAMIN) 1000 MCG tablet Take 1,000 mcg by mouth daily.     No current facility-administered medications for this  visit.    Allergies   Allergies as of 11/03/2023   (No Known Allergies)    Past Medical History   Past Medical History:  Diagnosis Date   (HFpEF) heart failure with preserved ejection fraction (HCC)    a. 12/2020 Echo: EF 65-70%, no rwma, mild LVH, nl RV fxn, triv MR.   CAD (coronary artery disease)    a. 12/2020 NSTEMI/PCI: LM nl, LAD min irregs, D1 99 (2.0x12 Onyx Frontier DES), LCX 85p (small), RCA 80p/m (4.0x18 Onyx Frontier DES - staged following day), 60d, RPDA mod dzs. EF 50-55%.   DVT (deep venous thrombosis) (HCC) 04/2017   GERD (gastroesophageal  reflux disease)    History of migraine    Hyperlipidemia    Hypertension    Long term current use of antithrombotics/antiplatelets    a.) on daily DAPT therapy (ticagrelor  + ASA)   Non-STEMI (non-ST elevated myocardial infarction) (HCC) 12/30/2020   Syncope and collapse    a.) single episode; related to dehydration   T2DM (type 2 diabetes mellitus) (HCC)     Past Surgical History   Past Surgical History:  Procedure Laterality Date   CHOLECYSTECTOMY     COLONOSCOPY     COLONOSCOPY WITH PROPOFOL  N/A 08/06/2017   Procedure: COLONOSCOPY WITH PROPOFOL ;  Surgeon: Jinny Carmine, MD;  Location: [facility];  Service: Endoscopy;  Laterality: N/A;  Diabetic - oral meds   CORONARY STENT INTERVENTION N/A 12/31/2020   Procedure: CORONARY STENT INTERVENTION;  Surgeon: Claudene Victory ORN, MD;  Location: [facility] INVASIVE CV LAB;  Service: Cardiovascular;  Laterality: N/A;   CORONARY STENT INTERVENTION N/A 01/01/2021   Procedure: CORONARY STENT INTERVENTION;  Surgeon: Dann Candyce RAMAN, MD;  Location: [facility] INVAS[facility] CV LAB;  Service: Cardiovascular;  Laterality: N/A;   ESOPHAGEAL DILATION  09/24/2016   Procedure: ESOPHAGEAL DILATION;  Surgeon: Jinny Carmine, MD;  Location: [facility];  Service: Gastroenterology;;   ESOPHAGOGASTRODUODENOSCOPY (EGD) WITH PROPOFOL  N/A 04/08/2015   Procedure: ESOPHAGOGASTRODUODENOSCOPY (EGD) WITH PROPOFOL  with dialation;  Surgeon: Carmine Jinny, MD;  Location: [facility];  Service: Endoscopy;  Laterality: N/A;  diabetic - oral meds   ESOPHAGOGASTRODUODENOSCOPY (EGD) WITH PROPOFOL  N/A 09/24/2016   Procedure: ESOPHAGOGASTRODUODENOSCOPY (EGD) WITH PROPOFOL  WITH DILATION;  Surgeon: Jinny Carmine, MD;  Location: [facility];  Service: Gastroenterology;  Laterality: N/A;  Diabetic - oral meds   FINGER AMPUTATION  01/27/2007   partial removal left index finger    LEFT HEART CATH AND CORONARY ANGIOGRAPHY N/A 12/31/2020   Procedure: LEFT HEART CATH AND  CORONARY ANGIOGRAPHY;  Surgeon: Claudene Victory ORN, MD;  Location: [facility] INVAS[facility] CV LAB;  Service: Cardiovascular;  Laterality: N/A;   LEFT HEART CATH AND CORONARY ANGIOGRAPHY N/A 01/01/2021   Procedure: LEFT HEART CATH AND CORONARY ANGIOGRAPHY;  Surgeon: Dann Candyce RAMAN, MD;  Location: [facility] INVASIVE CV LAB;  Service: Cardiovascular;  Laterality: N/A;   POLYPECTOMY  08/06/2017   Procedure: POLYPECTOMY INTESTINAL;  Surgeon: Jinny Carmine, MD;  Location: [facility];  Service: Endoscopy;;    Past Family History   Family History  Problem Relation Age of Onset   Hypertension Mother    Heart disease Father        CAD   Hypertension Father    Diabetes Father    Hypertension Sister    Hypertension Brother    Hypertension Brother     Past Social History   Social History   Socioeconomic History   Marital status: Married    Spouse name: Clotilda   Number  of children: 4   Years of education: Not on file   Highest education level: Not on file  Occupational History   Not on file  Tobacco Use   Smoking status: Former    Current packs/day: 0.00    Average packs/day: 2.0 packs/day for 27.0 years (54.0 ttl pk-yrs)    Types: Cigarettes    Start date: 12/30/1993    Quit date: 12/30/2020    Years since quitting: 2.8    Passive exposure: Never   Smokeless tobacco: Never  Vaping Use   Vaping status: Never Used  Substance and Sexual Activity   Alcohol use: No   Drug use: No   Sexual activity: Not on file  Other Topics Concern   Not on file  Social History Narrative   Not on file   Social Drivers of Health   Financial Resource Strain: Not on file  Food Insecurity: Not on file  Transportation Needs: Not on file  Physical Activity: Not on file  Stress: Not on file  Social Connections: Not on file  Intimate Partner Violence: Not on file    Review of Systems   General: Negative for anorexia, weight loss, fever, chills, fatigue, weakness. Eyes: Negative for vision changes.   ENT: Negative for hoarseness, difficulty swallowing , nasal congestion. CV: Negative for chest pain, angina, palpitations, dyspnea on exertion, peripheral edema.  Respiratory: Negative for dyspnea at rest, dyspnea on exertion, cough, sputum, wheezing.  GI: See history of present illness. GU:  Negative for dysuria, hematuria, urinary incontinence, urinary frequency, nocturnal urination.  MS: Negative for joint pain, low back pain.  Derm: Negative for rash or itching.  Neuro: Negative for weakness, abnormal sensation, seizure, frequent headaches, memory loss,  confusion.  Psych: Negative for anxiety, depression, suicidal ideation, hallucinations.  Endo: Negative for unusual weight change.  Heme: Negative for bruising or bleeding. Allergy: Negative for rash or hives.  Physical Exam   There were no vitals taken for this visit.   General: Well-nourished, well-developed in no acute distress.  Head: Normocephalic, atraumatic.   Eyes: Conjunctiva pink, no icterus. Mouth: Oropharyngeal mucosa moist and pink  Neck: Supple without thyromegaly, masses, or lymphadenopathy.  Lungs: Clear to auscultation bilaterally.  Heart: Regular rate and rhythm, no murmurs rubs or gallops.  Abdomen: Bowel sounds are normal, nontender, nondistended, no hepatosplenomegaly or masses,  no abdominal bruits or hernia, no rebound or guarding.   Rectal: not performed Extremities: No lower extremity edema. No clubbing or deformities.  Neuro: Alert and oriented x 4 , grossly normal neurologically.  Skin: Warm and dry, no rash or jaundice.   Psych: Alert and cooperative, normal mood and affect.  Labs   Lab Results  Component Value Date   HGBA1C 6.2 10/28/2023   Lab Results  Component Value Date   NA 142 10/15/2023   CL 106 10/15/2023   K 4.5 10/15/2023   CO2 20 10/15/2023   BUN 11 10/15/2023   CREATININE 1.22 10/15/2023   EGFR 70 10/15/2023   CALCIUM  9.1 10/15/2023   PHOS 3.2 12/31/2020   ALBUMIN 4.4  10/15/2023   GLUCOSE 186 (H) 10/15/2023   Lab Results  Component Value Date   ALT 15 10/15/2023   AST 13 10/15/2023   ALKPHOS 73 10/15/2023   BILITOT 0.6 10/15/2023   Lab Results  Component Value Date   WBC 6.5 08/18/2021   HGB 10.6 (L) 08/18/2021   HCT 30.2 (L) 08/18/2021   MCV 81.2 08/18/2021   PLT 250.0 08/18/2021  Imaging Studies   No results found.  Assessment/Plan:     ***H/H  Nic for tcs   Sonny RAMAN. Ezzard, MHS, PA-C [facility]

## 2023-11-08 ENCOUNTER — Other Ambulatory Visit: Payer: Self-pay | Admitting: Internal Medicine

## 2023-11-08 ENCOUNTER — Encounter: Payer: Self-pay | Admitting: Internal Medicine

## 2023-11-08 DIAGNOSIS — E1129 Type 2 diabetes mellitus with other diabetic kidney complication: Secondary | ICD-10-CM

## 2023-11-08 DIAGNOSIS — E1159 Type 2 diabetes mellitus with other circulatory complications: Secondary | ICD-10-CM

## 2023-11-08 MED ORDER — HYOSCYAMINE SULFATE 0.125 MG SL SUBL: 0.1250 mg | SUBLINGUAL_TABLET | SUBLINGUAL | 0 refills | Status: AC | PRN

## 2023-11-10 ENCOUNTER — Other Ambulatory Visit: Payer: Self-pay | Admitting: "Endocrinology

## 2023-11-10 DIAGNOSIS — E1159 Type 2 diabetes mellitus with other circulatory complications: Secondary | ICD-10-CM

## 2023-11-12 ENCOUNTER — Ambulatory Visit: Attending: Internal Medicine

## 2023-11-23 ENCOUNTER — Other Ambulatory Visit: Payer: Self-pay | Admitting: Cardiology

## 2023-12-04 ENCOUNTER — Other Ambulatory Visit: Payer: Self-pay | Admitting: Internal Medicine

## 2023-12-28 ENCOUNTER — Other Ambulatory Visit: Payer: Self-pay | Admitting: Internal Medicine

## 2024-01-03 ENCOUNTER — Ambulatory Visit: Payer: Self-pay | Admitting: Nurse Practitioner

## 2024-01-04 ENCOUNTER — Ambulatory Visit: Admitting: Nurse Practitioner

## 2024-02-15 ENCOUNTER — Encounter: Payer: Self-pay | Admitting: Internal Medicine

## 2024-02-22 ENCOUNTER — Telehealth: Payer: Self-pay | Admitting: Urology

## 2024-02-22 NOTE — Telephone Encounter (Signed)
 Attempted to return phone call to patient wife lvm for patient wife to call back

## 2024-02-22 NOTE — Telephone Encounter (Signed)
 SABRA

## 2024-02-23 NOTE — Telephone Encounter (Signed)
 Return call to pt about medication refill. Pt state's he is experiencing the same issue he had before with urine backing up into his kidneys. Pt state's he is getting up multiple times a night to voided. Pt is aware he will need an appointment for medication refills. After looking into pt chart, pt was previously on Alfuzosin  and then changed to Rapaflo .  Pt is made aware that he need to keep scheduled appointment. Voiced understanding

## 2024-02-24 ENCOUNTER — Ambulatory Visit: Payer: Self-pay

## 2024-02-24 ENCOUNTER — Other Ambulatory Visit (INDEPENDENT_AMBULATORY_CARE_PROVIDER_SITE_OTHER)

## 2024-02-24 DIAGNOSIS — M545 Low back pain, unspecified: Secondary | ICD-10-CM

## 2024-02-24 LAB — URINALYSIS, ROUTINE W REFLEX MICROSCOPIC
Bilirubin Urine: NEGATIVE
Hgb urine dipstick: NEGATIVE
Ketones, ur: NEGATIVE
Leukocytes,Ua: NEGATIVE
Nitrite: NEGATIVE
Specific Gravity, Urine: 1.01 (ref 1.000–1.030)
Total Protein, Urine: NEGATIVE
Urine Glucose: >=1000 — AB
Urobilinogen, UA: 1 (ref 0.0–1.0)
pH: 6 (ref 5.0–8.0)

## 2024-02-24 NOTE — Telephone Encounter (Signed)
 FYI Only or Action Required?: FYI only for provider: appointment scheduled on 03/01/24.  Patient was last seen in primary care on 09/17/2023 by Marylynn Verneita CROME, MD.  Called Nurse Triage reporting frequent urination/low back painn .  Symptoms began 2 weeks ago .  Interventions attempted: Nothing.  Symptoms are: stable.  Triage Disposition: See PCP When Office is Open (Within 3 Days)  Patient/caregiver understands and will follow disposition?: pt only wanting to see PCP. Did offer earlier appts but pt refused. Routing to office.          Message from Harlene ORN sent at 02/24/2024  8:54 AM EST  Reason for Triage: frequent urination; lower back pain started about a week or two ago   Reason for Disposition  [1] MODERATE back pain (e.g., interferes with normal activities) AND [2] present > 3 days  Answer Assessment - Initial Assessment Questions 1. ONSET: When did the pain begin? (e.g., minutes, hours, days)     2 weeks  2. LOCATION: Where does it hurt? (upper, mid or lower back)     Right side lower back  3. SEVERITY: How bad is the pain?  (e.g., Scale 1-10; mild, moderate, or severe)     8/10-- sharp  4. PATTERN: Is the pain constant? (e.g., yes, no; constant, intermittent)      Comes and goes eases after the bathroom  5. RADIATION: Does the pain shoot into your legs or somewhere else?     no 6. CAUSE:  What do you think is causing the back pain?      Unsure  7. BACK OVERUSE:  Any recent lifting of heavy objects, strenuous work or exercise?     no 8. MEDICINES: What have you taken so far for the pain? (e.g., nothing, acetaminophen , NSAIDS)     Tylenol   9. NEUROLOGIC SYMPTOMS: Do you have any weakness, numbness, or problems with bowel/bladder control?     no 10. OTHER SYMPTOMS: Do you have any other symptoms? (e.g., fever, abdomen pain, burning with urination, blood in urine)       Pain is worse when supine  Protocols used: Back Pain-A-AH

## 2024-02-24 NOTE — Telephone Encounter (Signed)
 Spoke to pt scheduled him to drop off urine sample ahead of appt

## 2024-02-26 LAB — URINE CULTURE

## 2024-02-28 ENCOUNTER — Ambulatory Visit: Payer: Self-pay | Admitting: Internal Medicine

## 2024-02-28 ENCOUNTER — Encounter: Payer: Self-pay | Admitting: Internal Medicine

## 2024-02-28 MED ORDER — ALFUZOSIN HCL ER 10 MG PO TB24: 10.0000 mg | ORAL_TABLET | Freq: Every evening | ORAL | 11 refills | Status: AC

## 2024-02-29 ENCOUNTER — Other Ambulatory Visit: Payer: Self-pay | Admitting: "Endocrinology

## 2024-02-29 ENCOUNTER — Other Ambulatory Visit: Payer: Self-pay | Admitting: Internal Medicine

## 2024-02-29 DIAGNOSIS — Z87891 Personal history of nicotine dependence: Secondary | ICD-10-CM

## 2024-03-01 ENCOUNTER — Ambulatory Visit: Admitting: "Endocrinology

## 2024-03-01 ENCOUNTER — Encounter: Payer: Self-pay | Admitting: "Endocrinology

## 2024-03-01 ENCOUNTER — Ambulatory Visit: Admitting: Internal Medicine

## 2024-03-01 VITALS — BP 118/68 | HR 68 | Ht 73.0 in | Wt 199.4 lb

## 2024-03-01 DIAGNOSIS — E782 Mixed hyperlipidemia: Secondary | ICD-10-CM

## 2024-03-01 DIAGNOSIS — E1159 Type 2 diabetes mellitus with other circulatory complications: Secondary | ICD-10-CM | POA: Diagnosis not present

## 2024-03-01 DIAGNOSIS — I10 Essential (primary) hypertension: Secondary | ICD-10-CM

## 2024-03-01 DIAGNOSIS — Z7984 Long term (current) use of oral hypoglycemic drugs: Secondary | ICD-10-CM

## 2024-03-01 LAB — POCT GLYCOSYLATED HEMOGLOBIN (HGB A1C): HbA1c, POC (controlled diabetic range): 8.8 % — AB (ref 0.0–7.0)

## 2024-03-01 MED ORDER — FREESTYLE LIBRE 3 PLUS SENSOR MISC: 2 refills | Status: AC

## 2024-03-01 MED ORDER — GLIPIZIDE ER 5 MG PO TB24: 5.0000 mg | ORAL_TABLET | Freq: Every day | ORAL | 3 refills | Status: AC

## 2024-03-01 NOTE — Progress Notes (Signed)
 "                                                                              03/01/2024, 4:19 PM   Endocrinology follow-up note  Subjective:    Patient ID: Jacob Baker, male    DOB: Mar 06, 1970.  Jacob Baker is being seen in follow-up after he was seen in consultation for management of currently uncontrolled symptomatic diabetes requested by  Marylynn Verneita CROME, MD.   Past Medical History:  Diagnosis Date   (HFpEF) heart failure with preserved ejection fraction (HCC)    a. 12/2020 Echo: EF 65-70%, no rwma, mild LVH, nl RV fxn, triv MR.   CAD (coronary artery disease)    a. 12/2020 NSTEMI/PCI: LM nl, LAD min irregs, D1 99 (2.0x12 Onyx Frontier DES), LCX 85p (small), RCA 80p/m (4.0x18 Onyx Frontier DES - staged following day), 60d, RPDA mod dzs. EF 50-55%.   DVT (deep venous thrombosis) (HCC) 04/2017   GERD (gastroesophageal reflux disease)    History of migraine    Hyperlipidemia    Hypertension    Long term current use of antithrombotics/antiplatelets    a.) on daily DAPT therapy (ticagrelor  + ASA)   Non-STEMI (non-ST elevated myocardial infarction) (HCC) 12/30/2020   Syncope and collapse    a.) single episode; related to dehydration   T2DM (type 2 diabetes mellitus) (HCC)     Past Surgical History:  Procedure Laterality Date   CHOLECYSTECTOMY  06/2021   COLONOSCOPY     COLONOSCOPY WITH PROPOFOL  N/A 08/06/2017   Procedure: COLONOSCOPY WITH PROPOFOL ;  Surgeon: Jinny Carmine, MD;  Location: [facility];  Service: Endoscopy;  Laterality: N/A;  Diabetic - oral meds   CORONARY STENT INTERVENTION N/A 12/31/2020   Procedure: CORONARY STENT INTERVENTION;  Surgeon: Claudene Victory ORN, MD;  Location: [facility];  Service: Cardiovascular;  Laterality: N/A;   CORONARY STENT INTERVENTION N/A 01/01/2021   Procedure: CORONARY STENT INTERVENTION;  Surgeon: Dann Candyce RAMAN, MD;  Location: [facility] INVASIVE CV LAB;  Service: Cardiovascular;  Laterality: N/A;   ESOPHAGEAL DILATION   09/24/2016   Procedure: ESOPHAGEAL DILATION;  Surgeon: Jinny Carmine, MD;  Location: [facility];  Service: Gastroenterology;;   ESOPHAGOGASTRODUODENOSCOPY (EGD) WITH PROPOFOL  N/A 04/08/2015   Procedure: ESOPHAGOGASTRODUODENOSCOPY (EGD) WITH PROPOFOL  with dialation;  Surgeon: Carmine Jinny, MD;  Location: [facility];  Service: Endoscopy;  Laterality: N/A;  diabetic - oral meds   ESOPHAGOGASTRODUODENOSCOPY (EGD) WITH PROPOFOL  N/A 09/24/2016   Procedure: ESOPHAGOGASTRODUODENOSCOPY (EGD) WITH PROPOFOL  WITH DILATION;  Surgeon: Jinny Carmine, MD;  Location: [facility];  Service: Gastroenterology;  Laterality: N/A;  Diabetic - oral meds   FINGER AMPUTATION  01/27/2007   partial removal left index finger    LEFT HEART CATH AND CORONARY ANGIOGRAPHY N/A 12/31/2020   Procedure: LEFT HEART CATH AND CORONARY ANGIOGRAPHY;  Surgeon: Claudene Victory ORN, MD;  Location: [facility];  Service: Cardiovascular;  Laterality: N/A;   LEFT HEART CATH AND CORONARY ANGIOGRAPHY N/A 01/01/2021   Procedure: LEFT HEART CATH AND CORONARY ANGIOGRAPHY;  Surgeon: Dann Candyce RAMAN, MD;  Location: [facility] INVASIVE CV LAB;  Service: Cardiovascular;  Laterality: N/A;   POLYPECTOMY  08/06/2017  Procedure: POLYPECTOMY INTESTINAL;  Surgeon: Jinny Carmine, MD;  Location: [facility];  Service: Endoscopy;;    Social History   Socioeconomic History   Marital status: Married    Spouse name: Clotilda   Number of children: 4   Years of education: Not on file   Highest education level: Not on file  Occupational History   Not on file  Tobacco Use   Smoking status: Former    Current packs/day: 0.00    Average packs/day: 2.0 packs/day for 27.0 years (54.0 ttl pk-yrs)    Types: Cigarettes    Start date: 12/30/1993    Quit date: 12/30/2020    Years since quitting: 3.1    Passive exposure: Never   Smokeless tobacco: Never  Vaping Use   Vaping status: Never Used  Substance and Sexual Activity   Alcohol  use: No   Drug use: No   Sexual activity: Not on file  Other Topics Concern   Not on file  Social History Narrative   Not on file   Social Drivers of Health   Tobacco Use: Medium Risk (03/01/2024)   Patient History    Smoking Tobacco Use: Former    Smokeless Tobacco Use: Never    Passive Exposure: Never  Programmer, Applications: Not on file  Food Insecurity: Not on file  Transportation Needs: Not on file  Physical Activity: Not on file  Stress: Not on file  Social Connections: Not on file  Depression (PHQ2-9): Low Risk (09/17/2023)   Depression (PHQ2-9)    PHQ-2 Score: 4  Alcohol Screen: Not on file  Housing: Not on file  Utilities: Not on file  Health Literacy: Not on file    Family History  Problem Relation Age of Onset   Hypertension Mother    Heart disease Father        CAD   Hypertension Father    Diabetes Father    Hypertension Sister    Hypertension Brother    Hypertension Brother     Outpatient Encounter Medications as of 03/01/2024  Medication Sig   Continuous Glucose Sensor (FREESTYLE LIBRE 3 PLUS SENSOR) MISC Change sensor every 15 days.   glipiZIDE  (GLUCOTROL  XL) 5 MG 24 hr tablet Take 1 tablet (5 mg total) by mouth daily with breakfast.   albuterol  (VENTOLIN  HFA) 108 (90 Base) MCG/ACT inhaler Inhale 2 puffs into the lungs every 6 (six) hours as needed for wheezing or shortness of breath. (Patient not taking: Reported on 09/17/2023)   alfuzosin  (UROXATRAL ) 10 MG 24 hr tablet Take 1 tablet (10 mg total) by mouth at bedtime.   ALPRAZolam  (XANAX ) 0.25 MG tablet Take 1 tablet (0.25 mg total) by mouth 2 (two) times daily as needed for anxiety. Before meals   amLODipine  (NORVASC ) 10 MG tablet TAKE 1 TABLET BY MOUTH EVERY DAY   aspirin  81 MG EC tablet Take 1 tablet (81 mg total) by mouth daily with breakfast. Swallow whole.   glucose blood (ONETOUCH VERIO) test strip Test sugars twice daily   hyoscyamine  (LEVSIN  SL) 0.125 MG SL tablet Place 1 tablet (0.125 mg  total) under the tongue every 4 (four) hours as needed for cramping.   losartan  (COZAAR ) 25 MG tablet Take 0.5 tablets (12.5 mg total) by mouth daily.   metFORMIN  (GLUCOPHAGE ) 850 MG tablet TAKE 1 TABLET BY MOUTH TWICE A DAY WITH FOOD   metoprolol  tartrate (LOPRESSOR ) 25 MG tablet Take 1 tablet (25 mg total) by mouth 2 (two) times daily.   nitroGLYCERIN  (NITROSTAT )  0.4 MG SL tablet Place 1 tablet (0.4 mg total) under the tongue every 5 (five) minutes as needed for chest pain.   ONETOUCH DELICA LANCETS 33G MISC Test sugars twice daily   pantoprazole  (PROTONIX ) 40 MG tablet TAKE 1 TABLET BY MOUTH EVERY DAY   ranolazine  (RANEXA ) 1000 MG SR tablet Take 1 tablet (1,000 mg total) by mouth 2 (two) times daily. (Patient taking differently: Take 500 mg by mouth 2 (two) times daily.)   ranolazine  (RANEXA ) 500 MG 12 hr tablet TAKE 1 TABLET TWICE A DAY   rosuvastatin  (CRESTOR ) 40 MG tablet TAKE 1 TABLET BY MOUTH EVERY DAY   vitamin B-12 (CYANOCOBALAMIN) 1000 MCG tablet Take 1,000 mcg by mouth daily.   [DISCONTINUED] Continuous Glucose Receiver (DEXCOM G7 RECEIVER) DEVI Use to monitor glucose continuously as directed. (Patient not taking: Reported on 10/28/2023)   [DISCONTINUED] Continuous Glucose Sensor (DEXCOM G7 SENSOR) MISC Use to monitor glucose continuously as directed. Change sensor every 10 days. (Patient not taking: Reported on 10/28/2023)   No facility-administered encounter medications on file as of 03/01/2024.    ALLERGIES: No Known Allergies  VACCINATION STATUS: Immunization History  Administered Date(s) Administered   Dtap, Unspecified 08/26/1971   Influenza Split 12/15/2013   Influenza, Seasonal, Injecte, Preservative Fre 10/23/2022   Influenza,inj,Quad PF,6+ Mos 10/26/2014, 10/14/2015, 10/13/2017, 10/18/2018, 10/20/2019, 10/21/2020, 10/21/2021   Influenza-Unspecified 09/26/2016   Moderna Sars-Covid-2 Vaccination 04/28/2019, 05/30/2019   PNEUMOCOCCAL CONJUGATE-20 10/21/2021   Pneumococcal  Polysaccharide-23 10/14/2015   Polio, Unspecified 08/26/1971   Tdap 10/26/2014    Diabetes He presents for his follow-up diabetic visit. He has type 2 diabetes mellitus. His disease course has been worsening. There are no hypoglycemic associated symptoms. Pertinent negatives for hypoglycemia include no confusion, headaches, pallor or seizures. Pertinent negatives for diabetes include no chest pain, no fatigue, no polydipsia, no polyphagia, no polyuria and no weakness. Symptoms are worsening. Diabetic complications include heart disease. (In the interim , he developed chest pain, hospitalized and diagnosed with ST Elevation MI. He is s/p stent placement.) Risk factors for coronary artery disease include dyslipidemia, diabetes mellitus, male sex, family history and tobacco exposure. Current diabetic treatment includes oral agent (triple therapy). His weight is fluctuating minimally. He is following a generally unhealthy diet. When asked about meal planning, he reported none. He has not had a previous visit with a dietitian. He rarely participates in exercise. His home blood glucose trend is increasing steadily. (He presents without any meter nor logs.  He reports blood glucose readings randomly, more than 200 mg per DL.  His point-of-care A1c is 8.8% increasing from 6.2%.   )  Hyperlipidemia This is a chronic problem. The current episode started more than 1 year ago. The problem is uncontrolled. Exacerbating diseases include diabetes. Pertinent negatives include no chest pain, myalgias or shortness of breath. Current antihyperlipidemic treatment includes statins. Risk factors for coronary artery disease include diabetes mellitus, dyslipidemia, male sex and family history.  Hypertension This is a chronic problem. The current episode started more than 1 year ago. The problem is controlled. Pertinent negatives include no chest pain, headaches, neck pain, palpitations or shortness of breath. Risk factors for  coronary artery disease include family history, dyslipidemia, diabetes mellitus, male gender and smoking/tobacco exposure. Past treatments include calcium  channel blockers. Hypertensive end-organ damage includes CAD/MI.    Objective:    BP 118/68   Pulse 68   Ht 6' 1 (1.854 m)   Wt 199 lb 6.4 oz (90.4 kg)   BMI 26.31 kg/m  Wt Readings from Last 3 Encounters:  03/01/24 199 lb 6.4 oz (90.4 kg)  10/28/23 203 lb 12.8 oz (92.4 kg)  09/17/23 201 lb 3.2 oz (91.3 kg)     CMP ( most recent) CMP     Component Value Date/Time   NA 142 10/15/2023 1041   NA 138 10/04/2013 2025   K 4.5 10/15/2023 1041   K 3.0 (L) 10/04/2013 2025   CL 106 10/15/2023 1041   CL 104 10/04/2013 2025   CO2 20 10/15/2023 1041   CO2 25 10/04/2013 2025   GLUCOSE 186 (H) 10/15/2023 1041   GLUCOSE 117 (H) 08/18/2021 1613   GLUCOSE 175 (H) 10/04/2013 2025   BUN 11 10/15/2023 1041   BUN 13 10/04/2013 2025   CREATININE 1.22 10/15/2023 1041   CREATININE 1.04 11/16/2019 1505   CALCIUM  9.1 10/15/2023 1041   CALCIUM  9.2 10/04/2013 2025   PROT 6.3 10/15/2023 1041   PROT 7.4 10/04/2013 2025   ALBUMIN 4.4 10/15/2023 1041   ALBUMIN 4.1 10/04/2013 2025   AST 13 10/15/2023 1041   AST 8 (L) 10/04/2013 2025   ALT 15 10/15/2023 1041   ALT 19 10/04/2013 2025   ALKPHOS 73 10/15/2023 1041   ALKPHOS 114 10/04/2013 2025   BILITOT 0.6 10/15/2023 1041   BILITOT 0.4 10/04/2013 2025   GFRNONAA >60 06/30/2021 0857   GFRNONAA 83 11/16/2019 1505   GFRAA 96 11/16/2019 1505     Diabetic Labs (most recent): Lab Results  Component Value Date   HGBA1C 8.8 (A) 03/01/2024   HGBA1C 6.2 10/28/2023   HGBA1C 6.3 04/28/2023   MICROALBUR 30 05/22/2020   MICROALBUR 2.6 10/20/2019   MICROALBUR 1.8 03/10/2019     Lipid Panel ( most recent) Lipid Panel     Component Value Date/Time   CHOL 120 10/15/2023 1041   CHOL 180 03/23/2013 0422   TRIG 238 (H) 10/15/2023 1041   TRIG 218 (H) 03/23/2013 0422   HDL 31 (L) 10/15/2023 1041    HDL 21 (L) 03/23/2013 0422   CHOLHDL 3.9 10/15/2023 1041   CHOLHDL 3.0 09/17/2023 1616   VLDL 31 03/28/2021 0852   VLDL 44 (H) 03/23/2013 0422   LDLCALC 51 10/15/2023 1041   LDLCALC 48 09/17/2023 1616   LDLCALC 115 (H) 03/23/2013 0422   LDLDIRECT 176.0 10/18/2018 1406   LABVLDL 38 10/15/2023 1041      Lab Results  Component Value Date   TSH 1.49 08/18/2021   TSH 1.32 11/16/2019   TSH 1.38 10/18/2018   TSH 1.07 06/11/2016   TSH 1.66 03/12/2014   TSH 1.140 11/24/2013   TSH 1.42 02/25/2012   FREET4 1.2 11/16/2019   FREET4 1.00 04/23/2014      Assessment & Plan:   1. Uncontrolled type 2 diabetes mellitus with CAD - CASPAR FAVILA has currently uncontrolled symptomatic type 2 DM since  54 years of age.  He presents without any meter nor logs.  He reports blood glucose readings randomly, more than 200 mg per DL.  His point-of-care A1c is 8.8% increasing from 6.2%.      Recent labs reviewed, showing normal renal function. - I had a long discussion with him about the progressive nature of diabetes and the pathology behind its complications. -his diabetes is complicated by CAD s/p stent placement, smoking and he remains at a high risk for more acute and chronic complications which include CVA, CKD, retinopathy, and neuropathy. These are all discussed in detail with him.  - I have counseled him  on diet  and weight management  by adopting a carbohydrate restricted/protein rich diet. Patient is encouraged to switch to  unprocessed or minimally processed     complex starch and increased protein intake (animal or plant source), fruits, and vegetables. -  he is advised to stick to a routine mealtimes to eat 3 meals  a day and avoid unnecessary snacks ( to snack only to correct hypoglycemia).   - he acknowledges that there is a room for improvement in his food and drink choices. - Suggestion is made for him to avoid simple carbohydrates  from his diet including Cakes, Sweet Desserts, Ice  Cream, Soda (diet and regular), Sweet Tea, Candies, Chips, Cookies, Store Bought Juices, Alcohol , Artificial Sweeteners,  Coffee Creamer, and Sugar-free Products, Lemonade. This will help patient to have more stable blood glucose profile and potentially avoid unintended weight gain.  - I have approached him with the following individualized plan to manage  his diabetes and patient agrees:   - His insurance did not provide coverage for SGLT2 inhibitors nor GLP-1 receptor agonists. - In light of his presentation with loss of control, he is reinitiated on glipizide  5 mg XL p.o. daily at breakfast along with metformin  850 mg p.o. twice a day with breakfast and supper.  - He is requesting that we send Libre CGM device to monitor blood glucose.    He is advised to call clinic for hypoglycemia below 70 or hyperglycemia above 200 mg per DL weekly average. - Specific targets for  A1c;  LDL, HDL, Triglycerides, and  Waist Circumference were discussed with the patient.  2) Blood Pressure /Hypertension: - His blood pressure is controlled to target.  He is advised to continue metoprolol  25 mg p.o. twice daily and lisinopril  20 mg p.o. daily.  During his next visit, he will be considered for stopping metoprolol .    3) Lipids/Hyperlipidemia:   Review of his recent repeat lipid panel showed controlled LDL at 48, triglycerides high at 238.  He is advised to continue Crestor  20 mg p.o. nightly.   Side effects and precautions discussed with him.    4)  Weight/Diet:  Body mass index is 26.31 kg/m.  He is not a candidate for major weight loss.  Exercise, and detailed carbohydrates information provided  -  detailed on discharge instructions.  5) Chronic Care/Health Maintenance:  -he  is on  Statin medications and  is encouraged to initiate and continue to follow up with Ophthalmology, Dentist,  Podiatrist at least yearly or according to recommendations, and has successfully quit smoking.    -  I have  recommended yearly flu vaccine and pneumonia vaccine at least every 5 years; moderate intensity exercise for up to 150 minutes weekly; and  sleep for at least 7 hours a day.  - he is  advised to maintain close follow up with Tullo, Teresa L, MD for primary care needs, as well as his other providers for optimal and coordinated care.   I spent  25  minutes in the care of the patient today including review of labs from CMP, Lipids, Thyroid  Function, Hematology (current and previous including abstractions from other facilities); face-to-face time discussing  his blood glucose readings/logs, discussing hypoglycemia and hyperglycemia episodes and symptoms, medications doses, his options of short and long term treatment based on the latest standards of care / guidelines;  discussion about incorporating lifestyle medicine;  and documenting the encounter. Risk reduction counseling performed per USPSTF guidelines to reduce cardiovascular risk factors.  Please refer to Patient Instructions for Blood Glucose Monitoring and Insulin /Medications Dosing Guide  in media tab for additional information. Please  also refer to  Patient Self Inventory in the Media  tab for reviewed elements of pertinent patient history.  Jacob Baker participated in the discussions, expressed understanding, and voiced agreement with the above plans.  All questions were answered to his satisfaction. he is encouraged to contact clinic should he have any questions or concerns prior to his return visit.  Dear Patient: Feel free to review your progress notes.  If you are reviewing this progress note and have questions about the meaning of /or medical terms being used, please make a note and address it at your next follow-up appointment.  Medical notes are meant to be a communication tool between medical professionals and require medical terms to be used for efficiency and insurance approval.   Follow up plan: - Return in about 3 months  (around 05/29/2024) for F/U with Pre-visit Labs, Meter/CGM/Logs, A1c here.  Ranny Earl, MD [facility] [address] [place], [place] [address] Phone: [phone]  Fax: [phone]    03/01/2024, 4:19 PM  This note was partially dictated with voice recognition software. Similar sounding words can be transcribed inadequately or may not  be corrected upon review.  "

## 2024-03-01 NOTE — Patient Instructions (Signed)

## 2024-03-02 ENCOUNTER — Ambulatory Visit: Admitting: Cardiology

## 2024-03-02 NOTE — Progress Notes (Unsigned)
 "     Clinical Summary Jacob Baker is a 54 y.o.male  seen today for follow up of the following medical problems.    1.CAD - NSTEMI 12/2020 - 12/2020 cath: LAD LIs, D1 99%, LCX prox 85%, RCA prox to mid 85% - DES to D1, staged DES to RCA. Residual LCX disease managed medically 12/2020 echo:LVEF 65-70%   07/2021 nuclear stress: normal perfusion - 03/2023 nuclear stressL no ischemia -intolerant to imdur    - chest pains at rest,  sharp pain left sided. 7-8/10 in severity. No other associated symptoms. Not positional. 1-2 times per month - compliant with meds - reports had stopped his brillinta at 1 year but had some chest pains and restarted taking.      - some recent chest pains - dull/sharp left sided, 3-4/10 in severity. No other associated symptoms. Can occur at rest or with activity. Not positional. Lasts 15-20 seconds. Random frequency. Started bout 1-2 weeks. Stable since onset.  - some SOB with acitivities which is new.  Some LE edema at times.        2.HTN -compliant with meds   - lisinopril  caused cough, he stopped taking   3. Hyperlipidemia - 02/2022 TC 118 TG 195 HDL 32 LDL 54 09/2022 TC 133 TG 188 HDL 34 LDL 67     3. Prior DVT   4. Restrictive lung disease Past Medical History:  Diagnosis Date   (HFpEF) heart failure with preserved ejection fraction (HCC)    a. 12/2020 Echo: EF 65-70%, no rwma, mild LVH, nl RV fxn, triv MR.   CAD (coronary artery disease)    a. 12/2020 NSTEMI/PCI: LM nl, LAD min irregs, D1 99 (2.0x12 Onyx Frontier DES), LCX 85p (small), RCA 80p/m (4.0x18 Onyx Frontier DES - staged following day), 60d, RPDA mod dzs. EF 50-55%.   DVT (deep venous thrombosis) (HCC) 04/2017   GERD (gastroesophageal reflux disease)    History of migraine    Hyperlipidemia    Hypertension    Long term current use of antithrombotics/antiplatelets    a.) on daily DAPT therapy (ticagrelor  + ASA)   Non-STEMI (non-ST elevated myocardial infarction) (HCC) 12/30/2020    Syncope and collapse    a.) single episode; related to dehydration   T2DM (type 2 diabetes mellitus) (HCC)      Allergies[1]   Current Outpatient Medications  Medication Sig Dispense Refill   albuterol  (VENTOLIN  HFA) 108 (90 Base) MCG/ACT inhaler Inhale 2 puffs into the lungs every 6 (six) hours as needed for wheezing or shortness of breath. (Patient not taking: Reported on 09/17/2023) 8 g 1   alfuzosin  (UROXATRAL ) 10 MG 24 hr tablet Take 1 tablet (10 mg total) by mouth at bedtime. 30 tablet 11   ALPRAZolam  (XANAX ) 0.25 MG tablet Take 1 tablet (0.25 mg total) by mouth 2 (two) times daily as needed for anxiety. Before meals 30 tablet 0   amLODipine  (NORVASC ) 10 MG tablet TAKE 1 TABLET BY MOUTH EVERY DAY 90 tablet 3   aspirin  81 MG EC tablet Take 1 tablet (81 mg total) by mouth daily with breakfast. Swallow whole. 90 tablet 3   Continuous Glucose Sensor (FREESTYLE LIBRE 3 PLUS SENSOR) MISC Change sensor every 15 days. 2 each 2   glipiZIDE  (GLUCOTROL  XL) 5 MG 24 hr tablet Take 1 tablet (5 mg total) by mouth daily with breakfast. 90 tablet 3   glucose blood (ONETOUCH VERIO) test strip Test sugars twice daily 50 each 6   hyoscyamine  (LEVSIN  SL) 0.125 MG  SL tablet Place 1 tablet (0.125 mg total) under the tongue every 4 (four) hours as needed for cramping. 30 tablet 0   losartan  (COZAAR ) 25 MG tablet Take 0.5 tablets (12.5 mg total) by mouth daily. 45 tablet 3   metFORMIN  (GLUCOPHAGE ) 850 MG tablet TAKE 1 TABLET BY MOUTH TWICE A DAY WITH FOOD 60 tablet 0   metoprolol  tartrate (LOPRESSOR ) 25 MG tablet Take 1 tablet (25 mg total) by mouth 2 (two) times daily. 180 tablet 1   nitroGLYCERIN  (NITROSTAT ) 0.4 MG SL tablet Place 1 tablet (0.4 mg total) under the tongue every 5 (five) minutes as needed for chest pain. 25 tablet 1   ONETOUCH DELICA LANCETS 33G MISC Test sugars twice daily 50 each 3   pantoprazole  (PROTONIX ) 40 MG tablet TAKE 1 TABLET BY MOUTH EVERY DAY 30 tablet 10   ranolazine  (RANEXA )  1000 MG SR tablet Take 1 tablet (1,000 mg total) by mouth 2 (two) times daily. (Patient taking differently: Take 500 mg by mouth 2 (two) times daily.) 180 tablet 1   ranolazine  (RANEXA ) 500 MG 12 hr tablet TAKE 1 TABLET TWICE A DAY 180 tablet 2   rosuvastatin  (CRESTOR ) 40 MG tablet TAKE 1 TABLET BY MOUTH EVERY DAY 90 tablet 1   vitamin B-12 (CYANOCOBALAMIN) 1000 MCG tablet Take 1,000 mcg by mouth daily.     No current facility-administered medications for this visit.     Past Surgical History:  Procedure Laterality Date   CHOLECYSTECTOMY  06/2021   COLONOSCOPY     COLONOSCOPY WITH PROPOFOL  N/A 08/06/2017   Procedure: COLONOSCOPY WITH PROPOFOL ;  Surgeon: Jinny Carmine, MD;  Location: [facility];  Service: Endoscopy;  Laterality: N/A;  Diabetic - oral meds   CORONARY STENT INTERVENTION N/A 12/31/2020   Procedure: CORONARY STENT INTERVENTION;  Surgeon: Claudene Victory ORN, MD;  Location: [facility];  Service: Cardiovascular;  Laterality: N/A;   CORONARY STENT INTERVENTION N/A 01/01/2021   Procedure: CORONARY STENT INTERVENTION;  Surgeon: Dann Candyce RAMAN, MD;  Location: [facility] CV [facility];  Service: Cardiovascular;  Laterality: N/A;   ESOPHAGEAL DILATION  09/24/2016   Procedure: ESOPHAGEAL DILATION;  Surgeon: Jinny Carmine, MD;  Location: [facility];  Service: Gastroenterology;;   ESOPHAGOGASTRODUODENOSCOPY (EGD) WITH PROPOFOL  N/A 04/08/2015   Procedure: ESOPHAGOGASTRODUODENOSCOPY (EGD) WITH PROPOFOL  with dialation;  Surgeon: Carmine Jinny, MD;  Location: [facility];  Service: Endoscopy;  Laterality: N/A;  diabetic - oral meds   ESOPHAGOGASTRODUODENOSCOPY (EGD) WITH PROPOFOL  N/A 09/24/2016   Procedure: ESOPHAGOGASTRODUODENOSCOPY (EGD) WITH PROPOFOL  WITH DILATION;  Surgeon: Jinny Carmine, MD;  Location: [facility];  Service: Gastroenterology;  Laterality: N/A;  Diabetic - oral meds   FINGER AMPUTATION  01/27/2007   partial removal left index finger    LEFT  HEART CATH AND CORONARY ANGIOGRAPHY N/A 12/31/2020   Procedure: LEFT HEART CATH AND CORONARY ANGIOGRAPHY;  Surgeon: Claudene Victory ORN, MD;  Location: [facility];  Service: Cardiovascular;  Laterality: N/A;   LEFT HEART CATH AND CORONARY ANGIOGRAPHY N/A 01/01/2021   Procedure: LEFT HEART CATH AND CORONARY ANGIOGRAPHY;  Surgeon: Dann Candyce RAMAN, MD;  Location: [facility] INV[facility] CV LAB;  Service: Cardiovascular;  Laterality: N/A;   POLYPECTOMY  08/06/2017   Procedure: POLYPECTOMY INTESTINAL;  Surgeon: Jinny Carmine, MD;  Location: [facility];  Service: Endoscopy;;     Allergies[2]    Family History  Problem Relation Age of Onset   Hypertension Mother    Heart disease Father  CAD   Hypertension Father    Diabetes Father    Hypertension Sister    Hypertension Brother    Hypertension Brother      Social History Mr. Roddey reports that he quit smoking about 3 years ago. His smoking use included cigarettes. He started smoking about 30 years ago. He has a 54 pack-year smoking history. He has never been exposed to tobacco smoke. He has never used smokeless tobacco. Mr. Ryker reports no history of alcohol use.   Review of Systems CONSTITUTIONAL: No weight loss, fever, chills, weakness or fatigue.  HEENT: Eyes: No visual loss, blurred vision, double vision or yellow sclerae.No hearing loss, sneezing, congestion, runny nose or sore throat.  SKIN: No rash or itching.  CARDIOVASCULAR:  RESPIRATORY: No shortness of breath, cough or sputum.  GASTROINTESTINAL: No anorexia, nausea, vomiting or diarrhea. No abdominal pain or blood.  GENITOURINARY: No burning on urination, no polyuria NEUROLOGICAL: No headache, dizziness, syncope, paralysis, ataxia, numbness or tingling in the extremities. No change in bowel or bladder control.  MUSCULOSKELETAL: No muscle, back pain, joint pain or stiffness.  LYMPHATICS: No enlarged nodes. No history of splenectomy.  PSYCHIATRIC: No history of  depression or anxiety.  ENDOCRINOLOGIC: No reports of sweating, cold or heat intolerance. No polyuria or polydipsia.  SABRA   Physical Examination There were no vitals filed for this visit. There were no vitals filed for this visit.  Gen: resting comfortably, no acute distress HEENT: no scleral icterus, pupils equal round and reactive, no palptable cervical adenopathy,  CV Resp: Clear to auscultation bilaterally GI: abdomen is soft, non-tender, non-distended, normal bowel sounds, no hepatosplenomegaly MSK: extremities are warm, no edema.  Skin: warm, no rash Neuro:  no focal deficits Psych: appropriate affect   Diagnostic Studies 12/2020 echo 1. Left ventricular ejection fraction, by estimation, is 65 to 70%. The  left ventricle has normal function. The left ventricle has no regional  wall motion abnormalities. There is mild left ventricular hypertrophy.  Left ventricular diastolic parameters  were normal.   2. Right ventricular systolic function is normal. The right ventricular  size is normal. Tricuspid regurgitation signal is inadequate for assessing  PA pressure.   3. The mitral valve is normal in structure. Trivial mitral valve  regurgitation. No evidence of mitral stenosis.   4. The aortic valve is tricuspid. Aortic valve regurgitation is not  visualized. No aortic stenosis is present.   5. The inferior vena cava is normal in size with greater than 50%  respiratory variability, suggesting right atrial pressure of 3 mmHg.    12/2020 cath CONCLUSIONS: High-grade stenosis in the large mid diagonal Synai Prettyman 99% reduced to 0% with TIMI-3 flow using a 2.0 x 12 Onyx frontier.  Chest discomfort was dissimilar to presenting complaint. Left main is normal LAD has mild diffuse disease Circumflex contains 80 to 90% proximal stenosis.  The vessel is diffusely diseased and gives origin to 2-2 small obtuse marginal branches.  If symptoms continue, perhaps this proximal lesion should be  stented. RCA has a rosary bead appearance.  It is a large vessel.  There is 60% mid, 60% distal, diffuse disease in the PDA and LV branches. Left ventriculography demonstrated mid anterior wall hypokinesis.  EF 50%.  LVEDP is normal.   RECOMMENDATIONS:   Aggressive risk factor modification. Smoking cessation. Monitor for recurrent symptoms and consider PCI of circumflex if symptoms do not abate with PCI of this moderate-sized diagonal Shamond Skelton   12/2020 staged PCI  Prox Cx lesion is  85% stenosed.  This was a small vessel that was diffusely diseased.   Prox RCA to Mid RCA lesion is 80% stenosed.   A drug-eluting stent was successfully placed using a STENT ONYX FRONTIER 4.0X18, posdilated to 4.5 mm.   Post intervention, there is a 0% residual stenosis.   Dist RCA lesion is 50% stenosed.   Diagonal stent was widely patent.   LV end diastolic pressure is normal.   There is no aortic valve stenosis.   Successful PCI of RCA.  He needs aggressive secondary prevention, smoking cessation.  Would consider changing Plavix  to Brilinta  since he had ACS.   03/2023 nuclear stress Stress ECG is negative for ischemia and arrhythmias.   LV perfusion is normal. There is no evidence of ischemia. There is no evidence of infarction.   Left ventricular function is normal. Nuclear stress EF: 63%. End diastolic cavity size is moderately enlarged. End systolic cavity size is normal.   Findings are consistent with no ischemia and no infarction. The study is low risk.  Assessment and Plan   1.CAD - atypical chest pains, recent nuclear stress test without ischemia  - some recent DOE, will check echo - had some residual disease on prior cath. If significant progression of symptoms may warrant repeat cath.      2. HTN - at goal Cough on lisinopril , will start losartan  12.5mg  daily.    3. Hyperlipidemia - LDL is at goal, continue current meds       Dorn PHEBE Ross, M.D., F.A.C.C.    [1] No Known  Allergies [2] No Known Allergies  "

## 2024-05-24 ENCOUNTER — Ambulatory Visit: Admitting: Cardiology

## 2024-05-29 ENCOUNTER — Ambulatory Visit: Admitting: "Endocrinology

## 2024-09-06 ENCOUNTER — Ambulatory Visit: Admitting: Urology
